# Patient Record
Sex: Female | Born: 1945 | Race: White | Hispanic: No | Smoking: Never smoker
Health system: Southern US, Community
[De-identification: ages and names within clinical notes are randomized; demographics above are authoritative.]

## PROBLEM LIST (undated history)

## (undated) DIAGNOSIS — K219 Gastro-esophageal reflux disease without esophagitis: Secondary | ICD-10-CM

## (undated) DIAGNOSIS — J45909 Unspecified asthma, uncomplicated: Secondary | ICD-10-CM

## (undated) DIAGNOSIS — K7460 Unspecified cirrhosis of liver: Secondary | ICD-10-CM

## (undated) DIAGNOSIS — E119 Type 2 diabetes mellitus without complications: Secondary | ICD-10-CM

## (undated) DIAGNOSIS — Z8719 Personal history of other diseases of the digestive system: Secondary | ICD-10-CM

## (undated) DIAGNOSIS — I10 Essential (primary) hypertension: Secondary | ICD-10-CM

## (undated) DIAGNOSIS — C8590 Non-Hodgkin lymphoma, unspecified, unspecified site: Secondary | ICD-10-CM

## (undated) DIAGNOSIS — M1990 Unspecified osteoarthritis, unspecified site: Secondary | ICD-10-CM

## (undated) DIAGNOSIS — J449 Chronic obstructive pulmonary disease, unspecified: Secondary | ICD-10-CM

## (undated) DIAGNOSIS — I4891 Unspecified atrial fibrillation: Secondary | ICD-10-CM

## (undated) DIAGNOSIS — E039 Hypothyroidism, unspecified: Secondary | ICD-10-CM

## (undated) DIAGNOSIS — D649 Anemia, unspecified: Secondary | ICD-10-CM

## (undated) DIAGNOSIS — F419 Anxiety disorder, unspecified: Secondary | ICD-10-CM

## (undated) HISTORY — PX: CATARACT EXTRACTION: SUR2

## (undated) HISTORY — PX: THYROIDECTOMY: SHX17

## (undated) HISTORY — PX: CHOLECYSTECTOMY: SHX55

## (undated) HISTORY — PX: TOE AMPUTATION: SHX809

## (undated) HISTORY — PX: TOTAL SHOULDER REPLACEMENT: SUR1217

## (undated) HISTORY — PX: WRIST SURGERY: SHX841

## (undated) HISTORY — PX: EYE SURGERY: SHX253

## (undated) HISTORY — PX: GALLBLADDER SURGERY: SHX652

## (undated) HISTORY — PX: TUBAL LIGATION: SHX77

## (undated) HISTORY — PX: HUMERUS SURGERY: SHX672

## (undated) HISTORY — PX: TONSILECTOMY, ADENOIDECTOMY, BILATERAL MYRINGOTOMY AND TUBES: SHX2538

## (undated) HISTORY — PX: COLONOSCOPY: SHX174

## (undated) LAB — HM DEXA SCAN

## (undated) LAB — HM COLONOSCOPY: HM Colonoscopy: NORMAL

## (undated) LAB — HM MAMMOGRAPHY
HM Mammogram: NEGATIVE
HM Mammogram: NORMAL
HM Mammogram: NORMAL

## (undated) MED FILL — NALOXONE 4 MG/ACTUATION NASAL SPRAY: 4 4 mg/actuation | NASAL | 1 days supply | Qty: 2 | Fill #0

---

## 1978-06-06 DIAGNOSIS — I82409 Acute embolism and thrombosis of unspecified deep veins of unspecified lower extremity: Secondary | ICD-10-CM

## 1978-06-06 HISTORY — DX: Acute embolism and thrombosis of unspecified deep veins of unspecified lower extremity: I82.409

## 2003-11-08 NOTE — Unmapped (Signed)
Signed by Alver Fisher MD on 11/08/2003 at 00:00:00  Orthopaedic      Imported By: Danelle Berry 11/16/2005 10:59:35    _____________________________________________________________________    External Attachment:    Please see [employer] EMR for this document.

## 2004-07-29 NOTE — Unmapped (Signed)
Signed by   LinkLogic on 08/01/2004 at 17:26:32  Patient: [name]  Note: All result statuses are Final unless otherwise noted.    Tests: (1) DIAG-L-SPINE AP& LAT & BENDING [phone])    Order NotePricilla Handler Order Number: [id number]    Order Note:     *** VERIFIED ***  [facility]  Reason:  LUMBAR SPINE PAIN  Dict.Staff: Harvel Ricks    Verified By: Harvel Ricks       Ver: 08/01/04   5:26 pm  Exams:  DIAG-L-SPINE AP& LAT & BENDING      LUMBAR SPINE WITH AP BENDING, FOUR VIEWS PERFORMED ON 07/29/2004:    INDICATION: Pain.    READING:    1. LUMBAR SPINE STABLE IN FLEXION AND EXTENSION.    2. TINY SPURS ON SUPERIOR ENDPLATE OF L4.  THERE IS MILD  ARTHROSIS IN THE SACROILIAC JOINTS.    /rmc:jb  **** end of result ****    Note: An exclamation mark (!) indicates a result that was not dispersed into   the flowsheet.  Document Creation Date: 08/01/2004 5:26 PM  _______________________________________________________________________    (1) Order result status: Final  Collection or observation date-time: 07/29/2004 15:34:00  Requested date-time: 07/29/2004 15:34:00  Receipt date-time:   Reported date-time: 08/01/2004 17:26:27  Referring Physician:    Ordering Physician: Val Riles [facility])  Specimen Source:   Source: QRS  Filler Order Number: [id number]  Lab site: [facility]

## 2005-08-01 ENCOUNTER — Inpatient Hospital Stay

## 2005-08-01 NOTE — Unmapped (Signed)
Signed by   LinkLogic on 08/01/2005 at 16:23:02  Patient: [name]  Note: All result statuses are Final unless otherwise noted.    Tests: (1) DIAG-SHOULDER MIN 2-VIEWS RT [phone])    Order NotePricilla Handler Order Number: [id number]    Order Note:     *** VERIFIED ***  [facility]  Reason:  RT SHLD PAIN  Dict.Staff: Harvel Ricks    Verified By: Harvel Ricks       Ver: 08/01/05   4:20 pm  Exams:  Fletcher Anon MIN 2-VIEWS RT      RIGHT SHOULDER, FOUR VIEWS PERFORMED ON 08/01/2005:    INDICATION: Pain.    READING:    NO RADIOGRAPHIC ABNORMALITY.    /rmc:jb  **** end of result ****    Note: An exclamation mark (!) indicates a result that was not dispersed into   the flowsheet.  Document Creation Date: 08/01/2005 4:23 PM  _______________________________________________________________________    (1) Order result status: Final  Collection or observation date-time: 08/01/2005 10:08:00  Requested date-time: 08/01/2005 10:08:00  Receipt date-time:   Reported date-time: 08/01/2005 16:20:08  Referring Physician: Cindi Carbon NON-STAFF  Ordering Physician: Gareth [facility])  Specimen Source:   Source: QRS  Filler Order Number: [id number]  Lab site: [facility]

## 2006-03-07 NOTE — Unmapped (Signed)
Signed by Christie Nottingham MD on 03/07/2006 at 19:18:22      Reason for Visit   Chief Complaint: rash around eye and mouth/hip pain      Allergies  Allergies have not been documented for this patient    Medications     Vital Signs:   Wt: 101 lbs.      Pulse: 80  BP: 90/70    Intake recorded by: Francene Boyers MA on March 07, 2006 2:56 PM      History of Present Illness: Rash around mouth and on eyelids.  Had similar rash many years ago.  Comes and goes, more inflamed at times.  Has been helped by cortisone in past.  Has not put anything on recently.  Has appt Oct 25 Dr. Bertram Millard, more painful yesterday, wished to see if anything she can do before seeing Dr. Bertram Millard.  Also c/o pain right leg, posteriorly under buttocks, radiating down posterior leg to knee.  Had recent [place] to [place], worse since then.  Had lumbar disc herniation inpast, does not feel like this.  Denies numbness, weakness of leg.           Physical Examination:     Additional Vitals:   BP: 90/  70    Physical Exam- Detail:   General Appearance: well-developed, well-nourished and in no acute distress.  Skin: Abnormal -Erythema in .5 to 1 cm area around lips, primarily on left, some scaling, well demarcated border.    Eyes: Sclera white, conjunctiva without injection and pallor.  PERRLA.  EOMI  Ears: No lesions.  Tympanic membranes translucent, non-bulging.  Canal walls pink, without discharge.  Hearing grossly intact.  Oropharynx: Normal appearance.  No erythema,exudate, or mass. No tonsillar swelling.  Oral Cavity: Gums pink, good dentition.  Oral mucosa and tongue without lesions.  Lumbar Spine: No misalignment, defects, or erythema.  No tenderness, crepitus or subluxation. Full ROM.  Left Lower Extremity: No misalignment, defects, or erythema.  No tenderness, crepitus or subluxation. No synovial thickening or nodularity. Full ROM.  Some pain with straight leg raise in sciatic nerve area.        New Problems:  SCIATICA (ICD-724.3)  FACIAL RASH  (ICD-782.1)  New Medications:  NYSTATIN-TRIAMCINOLONE OINT (NYSTATIN-TRIAMCINOLONE OINT) apply to affected area three times a day      Assessment and Plan  Status of Existing Problems:  Assessed SCIATICA as new - Christie Nottingham MD - Signed  Assessed FACIAL RASH as comment only - May be fungal etiology, try nystatin/triamcinolone ointment. Christie Nottingham MD  Assessed SCIATICA as comment only - Will refer for PT. - Christie Nottingham MD  New Problems:  Dx of SCIATICA (ICD-724.3)  Onset: 03/07/2006  Dx of FACIAL RASH (ICD-782.1)  Onset: 03/07/2006    Medications   New Prescriptions/Refills:  NYSTATIN-TRIAMCINOLONE OINT (NYSTATIN-TRIAMCINOLONE OINT) apply to affected area three times a day  #15g x 2 : Christie Nottingham MD (03/07/2006)    New medications:  NYSTATIN-TRIAMCINOLONE OINT -- apply to affected area three times a day  Start date: 03/07/2006      Today's Orders   Physical Therapy Evaluation [CPT-97001]  99213 - Ofc Vst, Est Level III [JOA-41660]            Prescriptions:  NYSTATIN-TRIAMCINOLONE OINT (NYSTATIN-TRIAMCINOLONE OINT) apply to affected area three times a day  #15g x 2   Entered and Authorized by: Christie Nottingham MD   Signed by: Christie Nottingham MD on  03/07/2006   Method used: Print then Give to Patient

## 2006-05-03 NOTE — Unmapped (Signed)
Signed by   LinkLogic on 05/03/2006 at 17:14:12  Patient: [name]  Note: All result statuses are Final unless otherwise noted.    Tests: (1)  (MR)    Order Note:                                      THE [facility]     PATIENT NAME:   GABRIELLA, WOODHEAD                   MR #:  [id number]  DATE OF BIRTH:  1945-07-29                         ACCOUNT #:  [id number]  ED PHYSICIAN:   Chelsea Aus, M.D.             ROOM #:  PRIMARY:        Sindy Messing, M.D.                 NURSING UNIT:  ED  REFERRING:                                         FC:  S  DICTATED BY:    Pearson Forster, M.D.                   ADMIT DATE:  05/03/2006  VISIT DATE:     05/03/2006                         DISCHARGE DATE:                           EMERGENCY DEPARTMENT ADMISSION NOTE        CHIEF COMPLAINT: Fall, left side pain.     HISTORY OF PRESENT ILLNESS:  The patient is a 60 year old female with no  significant past medical history who plays violin with an orchestrate and was  practicing at the [facility] when she lost her footing, falling  approximately three feet off a raised platform onto the floor.  She states  that she was more concerned about her violin when she fell and was not paying  attention to how exactly she fell.  She feels like she fell onto her left  side.  She denies any loss of consciousness but is having pain in the left  side of her chest as well as her left knee.  She states that she has been  unable to ambulate on the knee since falling.  She denies any abdominal pain  or other systemic complaints.     REVIEW OF SYSTEMS:  As per HPI.  All other systems reviewed are negative.     PAST MEDICAL HISTORY:  None.     MEDICATIONS:     1.  Progestin.     ALLERGIES:     1. Iodine, causes hives.     SOCIAL HISTORY:  Denies any tobacco use, has occasional alcohol consumption.  Denies any IV or recreational drug use.     PHYSICAL EXAMINATION:     VITAL SIGNS:  Temperature 97.1, pulse 83, blood pressure 124/80,  respirations  16, oxygen saturation 98% on room air.  GENERAL:  She is a well-appearing female in no acute distress.  HEENT:  Pupils are equal, round and reactive to light.  Oropharynx is moist  without erythema or exudate.  NECK:  Supple without lymphadenopathy.  LUNGS:  Clear to auscultation bilaterally with good air movement.  No pain on  respiration.  ABDOMEN:  Soft, nontender, nondistended without rebound or guarding.  CARDIOVASCULAR:  Regular rate and rhythm with no murmurs, rubs or gallops  appreciated.  EXTREMITIES:  Warm without clubbing, cyanosis or edema.  Her left knee had  mild suprapatellar effusion with tenderness along the lateral and medial  meniscus as well as pain with internal and external rotation.  She had no  pain with axial loading of the knee.  No anterior, posterior drawer laxity.  She does have pain with both varus and valgus stress of the joint.  She had  2+ dorsalis pedis pulses with intact sensation in L4 through S1 in the foot.     EMERGENCY DEPARTMENT COURSE:  The patient was seen and evaluated by myself as  well as Dr. Alla German. She was given Motrin 800 mg po for pain relief and had  an Ace wrap and ice placed on the knee chest x-ray and left knee films were  performed.  Upon reviewing the left knee films with the radiologist, obliques  were ordered to further identify a possible fracture.  She has evidence of a  tibial spine fracture.  Orthopedics was consulted to evaluate the patient for  possible operative management. The remainder of her emergency department  course and final disposition will be dictated in  an addendum to follow.                                                       ________________________________________  KS/akg                                ____  D:  05/03/2006 15:28                  Pearson Forster, M.D.  T:  05/03/2006 17:09  Job #:  [id number]                                        ________________________________________                                         ____                                        Chelsea Aus, M.D.                              EMERGENCY DEPARTMENT ADMISSION NOTE                                        COPY  PAGE    1 of 1    Note: An exclamation mark (!) indicates a result that was not dispersed into   the flowsheet.  Document Creation Date: 05/03/2006 5:14 PM  _______________________________________________________________________    (1) Order result status: Final  Collection or observation date-time: 05/03/2006 00:00  Requested date-time:   Receipt date-time:   Reported date-time:   Referring Physician: Sindy Messing  Ordering Physician:  [facility])  Specimen Source:   Source: DBS  Filler Order Number: [id number]  Lab site:

## 2006-05-03 NOTE — Unmapped (Signed)
Signed by   LinkLogic on 05/03/2006 at 16:02:35  Patient: [name]  Note: All result statuses are Final unless otherwise noted.    Tests: (1) DIAG-KNEE 1 OR 2-VIEWS LT ([id number])    Order NotePricilla Handler Order Number: [id number]    Non-EMR Ordering Provider: Pearson Forster A     Order Note:     *** VERIFIED [facility]  Reason:  FALL PAIN  Dict.Staff: Lang Snow [id number]  Dict.Res: Etheleen Mayhew [id number]  Verified By: Lang Snow       Ver: 05/03/06   4:04 pm  Exams:  DIAG-CHEST PA & LATERAL  DIAG-KNEE 1 OR 2-VIEWS LT    Exam: PA and lateral chest x-ray and 2 views of the left knee  dated 05/03/2006.    Indication:  FALL PAIN    Comparison: None.    Findings chest:  The lungs are clear. The cardiac and mediastinal contours are  normal in appearance. There are no bone or soft tissue  abnormalities seen.    Impression chest:  1. No acute cardiopulmonary disease.    Findings left knee:  There is a suprapatellar effusion. There is a thin oblique  lucency along the lateral articular surface of the tibia. The  lateral articular surface of the tibia is indistinct.    Impression left knee:  1. Suprapatellar effusion.  2. Cortical irregularity of the lateral tibial plateau.  Recommend evaluation with obliques to rule out fracture.  **** end of result ****    Note: An exclamation mark (!) indicates a result that was not dispersed into   the flowsheet.  Document Creation Date: 05/03/2006 4:02 PM  _______________________________________________________________________    (1) Order result status: Final  Collection or observation date-time: 05/03/2006 14:10:29  Requested date-time: 05/03/2006 14:01:00  Receipt date-time:   Reported date-time: 05/03/2006 16:04:13  Referring Physician: Trudee Grip  Ordering Physician:  Non-EMR Physician [facility])  Specimen Source:   Source: QRS  Filler Order Number: [id number]  Lab site: [facility]

## 2006-05-03 NOTE — Unmapped (Signed)
Signed by   LinkLogic on 05/03/2006 at 20:27:29  Patient: [name]  Note: All result statuses are Final unless otherwise noted.    Tests: (1)  (MR)    Order Note:                                      THE [facility]     PATIENT NAME:   [name], [name]                   MR #:  [id number]  DATE OF BIRTH:  [birth year]                         ACCOUNT #:  [id number]  ED PHYSICIAN:   Chelsea Aus, M.D.             ROOM #:  PRIMARY:        Sindy Messing, M.D.                 NURSING UNIT:  ED  REFERRING:      Referring Nonstaff                 FC:  N  DICTATED BY:    Pearson Forster, M.D.                   ADMIT DATE:  05/03/2006  VISIT DATE:     05/03/2006                         DISCHARGE DATE:                       EMERGENCY DEPARTMENT 24 HOUR FOLLOWUP NOTE     ADDENDUM - May 03, 2006 - ljf     The patient was awaiting repeat an orthopedic evaluation. Orthopedics  reviewed the patient's film and had a CT of the tibia performed for her  tibial spine and tibial plateau fracture.  Pending their evaluation, _____  they determined that at this point she be placed in knee immobilizer and  follow-up on an outpatient basis with the [facility].  She  was placed in a knee immobilizer, given crutches, education, told to follow  up with orthopedics and given the phone number to do so. She was made  nonweightbearing and will be following up later this week.     IMPRESSION:     1. Left tibial plateau fracture.     DISPOSITION:  The patient discharged home in stable condition.     PLAN:     1. She was given scripts for Percocet and Flexeril to be used as directed.  2. She is told to return for worsening pain, swelling or other worrisome     symptoms.                                                       ________________________________________  KS/lf                                 ____  D:  05/03/2006 19:55  Pearson Forster, M.D.  T:  05/03/2006 20:18  Job #:  [id number]                  ________________________________________                                        ____                                        Chelsea Aus, M.D.                          EMERGENCY DEPARTMENT 24 HOUR FOLLOWUP NOTE                                        COPY                   PAGE    1 of 1    Note: An exclamation mark (!) indicates a result that was not dispersed into   the flowsheet.  Document Creation Date: 05/03/2006 8:27 PM  _______________________________________________________________________    (1) Order result status: Final  Collection or observation date-time: 05/03/2006 00:00  Requested date-time:   Receipt date-time:   Reported date-time:   Referring Physician: Referring Nonstaff  Ordering Physician:  Reviewed In [facility])  Specimen Source:   Source: DBS  Filler Order Number: [id number]  Lab site:

## 2006-05-03 NOTE — Unmapped (Signed)
THE [facility]     PATIENT NAME:   [name], [name]                   MR #:  [id number]   DATE OF BIRTH:  1945/08/03                         ACCOUNT #:  [id number]   ED PHYSICIAN:   Chelsea Aus, M.D.             ROOM #:   PRIMARY:        Sindy Messing, M.D.                 NURSING UNIT:  ED   REFERRING:      Referring Nonstaff                 FC:  N   DICTATED BY:    Pearson Forster, M.D.                   ADMIT DATE:  05/03/2006   VISIT DATE:     05/03/2006                         DISCHARGE DATE:                       EMERGENCY DEPARTMENT 24 HOUR FOLLOWUP NOTE     ADDENDUM - May 03, 2006 - ljf     The patient was awaiting repeat an orthopedic evaluation. Orthopedics   reviewed the patient's film and had a CT of the tibia performed for her   tibial spine and tibial plateau fracture.  Pending their evaluation, _____   they determined that at this point she be placed in knee immobilizer and   follow-up on an outpatient basis with the [facility].  She   was placed in a knee immobilizer, given crutches, education, told to follow   up with orthopedics and given the phone number to do so. She was made   nonweightbearing and will be following up later this week.     IMPRESSION:     1. Left tibial plateau fracture.     DISPOSITION:  The patient discharged home in stable condition.     PLAN:     1. She was given scripts for Percocet and Flexeril to be used as directed.   2. She is told to return for worsening pain, swelling or other worrisome      symptoms.                                                     ________________________________________   KS/lf                                 ____   D:  05/03/2006 19:55                  Pearson Forster, M.D.   T:  05/03/2006 20:18   Job #:  [id number]  ________________________________________                                         ____                                         Chelsea Aus, M.D.                         EMERGENCY DEPARTMENT 24 HOUR FOLLOWUP NOTE                                         COPY                   PAGE    1 of 1                                         ____                                         Chelsea Aus, M.D.                         EMERGENCY DEPARTMENT 24 HOUR FOLLOWUP NOTE                                         COPY                   PAGE    1 of 1

## 2006-05-03 NOTE — Unmapped (Signed)
Signed by   LinkLogic on 05/03/2006 at 16:02:55  Patient: [name]  Note: All result statuses are Final unless otherwise noted.    Tests: (1) DIAG-KNEE 1 OR 2-VIEWS LT ([id number])    Order NotePricilla Handler Order Number: [id number]    Non-EMR Ordering Provider: Pearson Forster A     Order Note:     *** VERIFIED [facility]  Reason:  POSS. TIBIAL PLATEU FX  Dict.Staff: Lang Snow [id number]  Dict.Res: Etheleen Mayhew [id number]  Verified By: Lang Snow       Ver: 05/03/06   4:04 pm  Exams:  DIAG-KNEE 1 OR 2-VIEWS LT      Exam: Two views of the left knee dated 05/03/2006.    Indication: Evaluate for tibial plateau fracture.    Comparison: Two views of the left knee dated 05/03/2006.    Findings:  There is an impacted fracture of the lateral tibial plateau and  tibial spine. No other fractures are identified.    Impression:  1. intra-articular fracture of the lateral tibial plateau  **** end of result ****    Note: An exclamation mark (!) indicates a result that was not dispersed into   the flowsheet.  Document Creation Date: 05/03/2006 4:02 PM  _______________________________________________________________________    (1) Order result status: Final  Collection or observation date-time: 05/03/2006 15:06:09  Requested date-time: 05/03/2006 15:03:00  Receipt date-time:   Reported date-time: 05/03/2006 16:04:33  Referring Physician: Trudee Grip  Ordering Physician:  Non-EMR Physician [facility])  Specimen Source:   Source: QRS  Filler Order Number: [id number]  Lab site: [facility]

## 2006-05-03 NOTE — Unmapped (Signed)
Signed by   LinkLogic on 05/03/2006 at 16:02:35  Patient: [name]  Note: All result statuses are Final unless otherwise noted.    Tests: (1) DIAG-CHEST PA & LATERAL [id number])    Order NotePricilla Handler Order Number: [id number]    Non-EMR Ordering Provider: Pearson Forster A     Order Note:     *** VERIFIED [facility]  Reason:  FALL PAIN  Dict.Staff: Lang Snow [id number]  Dict.Res: Etheleen Mayhew [id number]  Verified By: Lang Snow       Ver: 05/03/06   4:04 pm  Exams:  DIAG-CHEST PA & LATERAL  DIAG-KNEE 1 OR 2-VIEWS LT    Exam: PA and lateral chest x-ray and 2 views of the left knee  dated 05/03/2006.    Indication:  FALL PAIN    Comparison: None.    Findings chest:  The lungs are clear. The cardiac and mediastinal contours are  normal in appearance. There are no bone or soft tissue  abnormalities seen.    Impression chest:  1. No acute cardiopulmonary disease.    Findings left knee:  There is a suprapatellar effusion. There is a thin oblique  lucency along the lateral articular surface of the tibia. The  lateral articular surface of the tibia is indistinct.    Impression left knee:  1. Suprapatellar effusion.  2. Cortical irregularity of the lateral tibial plateau.  Recommend evaluation with obliques to rule out fracture.  **** end of result ****    Note: An exclamation mark (!) indicates a result that was not dispersed into   the flowsheet.  Document Creation Date: 05/03/2006 4:02 PM  _______________________________________________________________________    (1) Order result status: Final  Collection or observation date-time: 05/03/2006 14:10:33  Requested date-time: 05/03/2006 14:01:00  Receipt date-time:   Reported date-time: 05/03/2006 16:04:13  Referring Physician: Trudee Grip  Ordering Physician:  Non-EMR Physician [facility])  Specimen Source:   Source: QRS  Filler Order Number: [id number]  Lab site: [facility]

## 2006-05-03 NOTE — Unmapped (Signed)
THE [facility]     PATIENT NAME:   SARON, VANORMAN                   MR #:  [id number]   DATE OF BIRTH:  1946-05-19                         ACCOUNT #:  [id number]   ED PHYSICIAN:   Chelsea Aus, M.D.             ROOM #:   PRIMARY:        Sindy Messing, M.D.                 NURSING UNIT:  ED   REFERRING:                                         FC:  S   DICTATED BY:    Pearson Forster, M.D.                   ADMIT DATE:  05/03/2006   VISIT DATE:     05/03/2006                         DISCHARGE DATE:                           EMERGENCY DEPARTMENT ADMISSION NOTE       CHIEF COMPLAINT: Fall, left side pain.     HISTORY OF PRESENT ILLNESS:  The patient is a 60 year old female with no   significant past medical history who plays violin with an orchestrate and was   practicing at the [facility] when she lost her footing, falling   approximately three feet off a raised platform onto the floor.  She states   that she was more concerned about her violin when she fell and was not paying   attention to how exactly she fell.  She feels like she fell onto her left   side.  She denies any loss of consciousness but is having pain in the left   side of her chest as well as her left knee.  She states that she has been   unable to ambulate on the knee since falling.  She denies any abdominal pain   or other systemic complaints.     REVIEW OF SYSTEMS:  As per HPI.  All other systems reviewed are negative.     PAST MEDICAL HISTORY:  None.     MEDICATIONS:     1.  Progestin.     ALLERGIES:     1. Iodine, causes hives.     SOCIAL HISTORY:  Denies any tobacco use, has occasional alcohol consumption.   Denies any IV or recreational drug use.     PHYSICAL EXAMINATION:     VITAL SIGNS:  Temperature 97.1, pulse 83, blood pressure 124/80, respirations   16, oxygen saturation 98% on room air.   GENERAL:  She is a well-appearing female in no acute distress.   HEENT:  Pupils are equal, round and reactive  to light.  Oropharynx is moist   without erythema or exudate.   NECK:  Supple without lymphadenopathy.   LUNGS:  Clear to auscultation bilaterally with good air movement.  No pain on  respiration.   ABDOMEN:  Soft, nontender, nondistended without rebound or guarding.   CARDIOVASCULAR:  Regular rate and rhythm with no murmurs, rubs or gallops   appreciated.   EXTREMITIES:  Warm without clubbing, cyanosis or edema.  Her left knee had   mild suprapatellar effusion with tenderness along the lateral and medial   meniscus as well as pain with internal and external rotation.  She had no   pain with axial loading of the knee.  No anterior, posterior drawer laxity.   She does have pain with both varus and valgus stress of the joint.  She had   2+ dorsalis pedis pulses with intact sensation in L4 through S1 in the foot.     EMERGENCY DEPARTMENT COURSE:  The patient was seen and evaluated by myself as   well as Dr. Alla German. She was given Motrin 800 mg po for pain relief and had   an Ace wrap and ice placed on the knee chest x-ray and left knee films were   performed.  Upon reviewing the left knee films with the radiologist, obliques   were ordered to further identify a possible fracture.  She has evidence of a   tibial spine fracture.  Orthopedics was consulted to evaluate the patient for   possible operative management. The remainder of her emergency department   course and final disposition will be dictated in  an addendum to follow.                                                     ________________________________________   KS/akg                                ____   D:  05/03/2006 15:28                  Pearson Forster, M.D.   T:  05/03/2006 17:09   Job #:  [id number]                                           ________________________________________                                         ____                                         Chelsea Aus, M.D.                             EMERGENCY DEPARTMENT ADMISSION NOTE                                          COPY                   PAGE    1 of 1

## 2006-05-03 NOTE — Unmapped (Signed)
Signed by   LinkLogic on 05/03/2006 at 21:02:30  Patient: [name]  Note: All result statuses are Final unless otherwise noted.    Tests: (1) CT-LOWER EXTREM W/O CONT LT ([id number])    Order NotePricilla Handler Order Number: [id number]    Non-EMR Ordering Provider: Pearson Forster A     Order Note:     *** VERIFIED [facility]  Reason:  LEFT KNEE FX  Dict.Staff: Nash Mantis [phone]  Dict.Res: Alfonso Patten [phone]  Verified By: Nash Mantis       Ver: 05/03/06   9:04 pm  Exams:  Gilford Silvius EXTREM W/O CONT LT      CT left knee 05/03/2006    Indication: Lateral condyle fracture    Technical factors: Axial imaging was performed through the knee  with images reconstructed at 2.5 and 1.25 mm slices. Coronal  sagittal reformations were performed. No contrast was  administered.    Findings:    There is a comminuted depressed fracture of the proximal tibia  with depression of the lateral tibial plateau. The greatest  degree of depression occurs posteriorly where the fragment is  depressed 1 cm. Fracture lines extend medially into the tibial  spines and into the medial tibial condyle which is not  depressed. There is also a vertical fracture component that  extends inferiorly through the lateral portion of the proximal  tibia. There is a large lipohemarthrosis.    Impression:    Comminuted proximal tibia fracture with approximately 1 cm  depression of the lateral tibial plateau.  **** end of result ****    Note: An exclamation mark (!) indicates a result that was not dispersed into   the flowsheet.  Document Creation Date: 05/03/2006 9:02 PM  _______________________________________________________________________    (1) Order result status: Final  Collection or observation date-time: 05/03/2006 17:05:56  Requested date-time: 05/03/2006 16:25:00  Receipt date-time:   Reported date-time: 05/03/2006 21:04:03  Referring Physician: Trudee Grip  Ordering Physician:  Non-EMR Physician [facility])  Specimen Source:    Source: QRS  Filler Order Number: [id number]  Lab site: [facility]

## 2006-05-09 NOTE — Unmapped (Signed)
Signed by   LinkLogic on 05/10/2006 at 01:25:44  Patient: [name]  Note: All result statuses are Final unless otherwise noted.    Tests: (1)  (MR)    Order Note:                                      THE [facility]     PATIENT NAME:   [name], [name]                   MR #:  [id number]  DATE OF BIRTH:  1946-04-21                         ACCOUNT #:  [id number]  PHYSICIAN:      Skipper Cliche, M.D.             ROOM #:  [phone]  SERVICE:        Orthopedic Surgery                 NURSING UNIT:  [phone]  PRIMARY:        Sindy Messing, M.D.                 FC:  N  REFERRING:      Skipper Cliche, M.D.             ADMIT DATE:  05/09/2006  DICTATED BY:    Skipper Cliche, M.D.             PROCEDURE DATE:  05/09/2006                                                     DISCHARGE DATE:                                     PROCEDURE/OTHER     PREOPERATIVE DIAGNOSIS:     1. Left tibial plateau fracture, unicondylar.     POSTOPERATIVE DIAGNOSIS:     1. Left tibial plateau fracture, unicondylar.        PROCEDURE PERFORMED:     1. Open reduction and internal fixation of left tibial plateau with internal     fixation.     SURGEON:  Skipper Cliche, M.D.     ASSISTANT:  Dr. Laureen Ochs     ANESTHESIA:  General anesthesia.     CLINICAL HISTORY:  The patient is a 60 year old female who sustained a fall  downstep and sustained a left tibial plateau fracture.  She was seen in the  emergency room and was evaluated in the clinic and scheduled for surgery.  The patient also has significant ecchymosis involving her left medial ankle.     DETAILS OF PROCEDURE:  The patient taken to the operating room, placed under  general anesthesia, preoperative antibiotics given.  A timeout was performed  to identify the correct extremity.  Left lower extremity was sterilely  prepped and draped in the usual fashion.  The leg was exsanguinated and  tourniquet inflated.  A lateral approach to the tibial plateau was carried  out.  The IT band was  split.  It was  elevated off the anterior and posterior  aspects of the tibial plateau.  The anterior compartment was also elevated  submuscularly.  A submeniscal dissection was carried out.  The meniscus was  identified and was deemed to be intact.  The patient has a large depressed  tibial plateau fracture involving the posterolateral corner.  There was no  split in the frontal plane.  However, there is a split in the sagittal plane.  We were unable to lift his open, thus osteotomy window was created inferiorly  and using carpi and bone tendon we were able to elevate the depressed  articular segment.  Once that was elevated, a bone graft was packed behind.  The reduction was attempted to be stabilized with K-wire and further  reinforce the screw in front of anteriorly and posteriorly.  Another screw  was placed down the shaft to capture the long extended split of the tibia.  Once the depressed articular surface was reduced and stabilized the 12-hole  plate was used.  We were able to stabilize the plate to the shaft and reduced  it first to the standard screws; one with a kickstand screw, then another one  in the shaft.  At the joint line level, two regular screws were placed also  to reduce the fracture the plate to the tibial plateau.  Additional locking  screw was placed as a subchondral graft and two along the shaft with the most  distal screw and the one in the middle.  We were able to have a rigid  construct to span the fracture.  The joint was well reduced.  The subacromial  repair was able to be done through to the hole in the tibial plateau plate.  This was repaired with 0 PDS.  The IT band was able to be closed over the  plate and later closure was done.  A sterile dressing was then placed.  A  C-arm image was checked with the foot ankle was also done and it was deemed  to be negative for any fracture.  Once done with that, sterile dressing was  placed and the patient was awakened and taken to the recovery  room.  Intraoperative film was also taken and we were able to satisfactorily reduce  the joint surface as well as stabilize the fracture both the unicondylar and  also down in the shaft.                                                                ________________________________________  TTL/em                                ____  D:  05/09/2006 16:02                  Skipper Cliche, M.D.  T:  05/10/2006 01:10  Job #:  [id number]  c:   Laureen Ochs                                     PROCEDURE/OTHER  COPY                   PAGE    1 of 1    Note: An exclamation mark (!) indicates a result that was not dispersed into   the flowsheet.  Document Creation Date: 05/10/2006 1:25 AM  _______________________________________________________________________    (1) Order result status: Final  Collection or observation date-time: 05/09/2006 00:00  Requested date-time:   Receipt date-time:   Reported date-time:   Referring Physician: Dorothy Spark  Ordering Physician:  Reviewed In [facility])  Specimen Source:   Source: DBS  Dewain Penning Order Number: [id number]  Lab site:

## 2006-05-09 NOTE — Unmapped (Signed)
Signed by Skipper Cliche MD on 05/09/2006 at 00:00:00  Hospital Procedure Report      Imported By: Coletta Memos 12/28/2009 11:21:32    _____________________________________________________________________    External Attachment:    Please see [facility] EMR for this document.

## 2006-05-09 NOTE — Unmapped (Signed)
Signed by   LinkLogic on 05/09/2006 at 15:47:24  Patient: [name]  Note: All result statuses are Final unless otherwise noted.    Tests: (1) DIAG-KNEE 1 OR 2-VIEWS LT [phone])    Order NotePricilla Handler Order Number: [id number]    Order Note:     *** VERIFIED [facility]  Reason:  LT TIB-PLAT FX, ORIF  Dict.Staff: Rickard Patience [id number]    Verified By: Rickard Patience      Ver: 05/09/06   3:49 pm  Exams:  DIAG-KNEE 1 OR 2-VIEWS LT      Left interoperative portable knee 05/09/2006 1520 hours.    Comparison is made to left knee x-ray of 05/03/2006.    Indication: Left tibial plateau fracture. Open reduction  internal fixation.    Findings:    A malleable plate has been applied to the proximal tibia  containing 6 screws and 2 interfragmentary screws. There is a  comminuted intra-articular tibial plateau fracture. The joint  space is preserved. There is air within the joint.    Impression:    Open reduction internal fixation left lateral tibial plateau  fracture with minimal articular depression.  **** end of result ****    Note: An exclamation mark (!) indicates a result that was not dispersed into   the flowsheet.  Document Creation Date: 05/09/2006 3:47 PM  _______________________________________________________________________    (1) Order result status: Final  Collection or observation date-time: 05/09/2006 15:24:23  Requested date-time: 05/09/2006 12:23:00  Receipt date-time:   Reported date-time: 05/09/2006 15:49:09  Referring Physician: Dorothy Spark  Ordering Physician: Dorothy Spark (LETT)  Specimen Source:   Source: QRS  Filler Order Number: [id number]  Lab site: [facility]

## 2006-05-09 NOTE — Unmapped (Signed)
Signed by   LinkLogic on 05/09/2006 at 15:52:43  Patient: [name]  Note: All result statuses are Final unless otherwise noted.    Tests: (1) DIAG-FLUORO UP TO 1 HOUR ([id number])    Order NotePricilla Handler Order Number: [id number]    Order Note:     *** VERIFIED [facility]  Reason:  LT TIB-PLAT FX, ORIF  Dict.Staff: Rickard Patience [id number]    Verified By: Rickard Patience      Ver: 05/09/06   3:54 pm  Exams:  DIAG-FLUORO UP TO 1 HOUR      Diagnostic interoperative fluoroscopy 05/09/2006 1520 hours.    Comparison is made to diagnostic interoperative left knee x-ray  performed same day same time.    Impression:    Up to 1 hour of fluoroscopy was provided intraoperatively during  open reduction internal fixation of a left lateral tibial  plateau fracture without a radiologist in attendance.  **** end of result ****    Note: An exclamation mark (!) indicates a result that was not dispersed into   the flowsheet.  Document Creation Date: 05/09/2006 3:52 PM  _______________________________________________________________________    (1) Order result status: Final  Collection or observation date-time: 05/09/2006 15:24:18  Requested date-time: 05/09/2006 12:23:00  Receipt date-time:   Reported date-time: 05/09/2006 15:54:06  Referring Physician: Dorothy Spark  Ordering Physician: Dorothy Spark (LETT)  Specimen Source:   Source: QRS  Filler Order Number: [id number]  Lab site: [facility]

## 2006-05-10 NOTE — Unmapped (Signed)
THE [facility]     PATIENT NAME:   [name], [name]                   MR #:  [id number]   DATE OF BIRTH:  September 27, 1945                         ACCOUNT #:  [id number]   PHYSICIAN:      Skipper Cliche, M.D.             ROOM #:  [phone]   SERVICE:        Orthopedic Surgery                 NURSING UNIT:  [phone]   PRIMARY:        Sindy Messing, M.D.                 FC:  N   REFERRING:      Skipper Cliche, M.D.             ADMIT DATE:  05/09/2006   DICTATED BY:    Skipper Cliche, M.D.             PROCEDURE DATE:    05/09/2006                                                      DISCHARGE DATE:                                     PROCEDURE/OTHER     PREOPERATIVE DIAGNOSIS:     1. Left tibial plateau fracture, unicondylar.     POSTOPERATIVE DIAGNOSIS:     1. Left tibial plateau fracture, unicondylar.       PROCEDURE PERFORMED:     1. Open reduction and internal fixation of left tibial plateau with internal      fixation.     SURGEON:  Skipper Cliche, M.D.     ASSISTANT:  Dr. Laureen Ochs     ANESTHESIA:  General anesthesia.     CLINICAL HISTORY:  The patient is a 60 year old female who sustained a fall   downstep and sustained a left tibial plateau fracture.  She was seen in the   emergency room and was evaluated in the clinic and scheduled for surgery.   The patient also has significant ecchymosis involving her left medial ankle.     DETAILS OF PROCEDURE:  The patient taken to the operating room, placed under   general anesthesia, preoperative antibiotics given.  A timeout was performed   to identify the correct extremity.  Left lower extremity was sterilely   prepped and draped in the usual fashion.  The leg was exsanguinated and   tourniquet inflated.  A lateral approach to the tibial plateau was carried   out.  The IT band was split.  It was elevated off the anterior and posterior   aspects of the tibial plateau.  The anterior compartment was also elevated   submuscularly.  A  submeniscal dissection was carried out.  The meniscus was   identified and was deemed to be intact.  The patient has a large depressed   tibial plateau fracture  involving the posterolateral corner.  There was no   split in the frontal plane.  However, there is a split in the sagittal plane.   We were unable to lift his open, thus osteotomy window was created inferiorly   and using carpi and bone tendon we were able to elevate the depressed   articular segment.  Once that was elevated, a bone graft was packed behind.   The reduction was attempted to be stabilized with K-wire and further   reinforce the screw in front of anteriorly and posteriorly.  Another screw   was placed down the shaft to capture the long extended split of the tibia.   Once the depressed articular surface was reduced and stabilized the 12-hole   plate was used.  We were able to stabilize the plate to the shaft and reduced   it first to the standard screws; one with a kickstand screw, then another one   in the shaft.  At the joint line level, two regular screws were placed also   to reduce the fracture the plate to the tibial plateau.  Additional locking   screw was placed as a subchondral graft and two along the shaft with the most   distal screw and the one in the middle.  We were able to have a rigid   construct to span the fracture.  The joint was well reduced.  The subacromial   repair was able to be done through to the hole in the tibial plateau plate.   This was repaired with 0 PDS.  The IT band was able to be closed over the   plate and later closure was done.  A sterile dressing was then placed.  A   C-arm image was checked with the foot ankle was also done and it was deemed   to be negative for any fracture.  Once done with that, sterile dressing was   placed and the patient was awakened and taken to the recovery room.   Intraoperative film was also taken and we were able to satisfactorily reduce   the joint surface as well as stabilize  the fracture both the unicondylar and   also down in the shaft.                                                           ________________________________________   TTL/em                                ____   D:  05/09/2006 16:02                  Skipper Cliche, M.D.   T:  05/10/2006 01:10   Job #:  [id number]   c:   Laureen Ochs                                     PROCEDURE/OTHER                                         COPY  PAGE    1 of 1   TTL/em                                ____   D:  05/09/2006 16:02                  Skipper Cliche, M.D.   T:  05/10/2006 01:10   Job #:  [id number]   c:   Laureen Ochs                                     PROCEDURE/OTHER                                         COPY                   PAGE    1 of 1

## 2006-06-19 ENCOUNTER — Inpatient Hospital Stay

## 2006-06-19 NOTE — Unmapped (Signed)
Signed by   LinkLogic on 06/20/2006 at 16:25:54  Patient: [name]  Note: All result statuses are Final unless otherwise noted.    Tests: (1) DIAG-KNEE 1 OR 2-VIEWS RT ([id number])    Order Note: Pricilla Handler Order Number: [id number]    Order Note:     *** VERIFIED ***  [facility]  Reason:  S/P LT TIBIAL PLATEAU FX-LONG CASSETTE NON WEIGHT  BEARING  Dict.Staff: Vertell Novak [phone]    Verified By: Kasandra Knudsen D      Ver: 06/20/06   4:27 pm  Exams:  DIAG-KNEE 1 OR 2-VIEWS RT      LEFT KNEE, TWO VIEWS PERFORMED ON 06/19/2006:    HISTORY: Left tibial plateau fracture.    COMPARISON: 05/09/2006    IMPRESSION:    Images were obtained nonweight bearing as requested.    There has been little change from 12/4 in the appearance of the  tibial plateau fracture which was treated with a long malleable  plate.  No new abnormalities are noted.  There is no evidence of  loosing or other complication.  **** end of result ****    Note: An exclamation mark (!) indicates a result that was not dispersed into   the flowsheet.  Document Creation Date: 06/20/2006 4:25 PM  _______________________________________________________________________    (1) Order result status: Final  Collection or observation date-time: 06/19/2006 07:00:00  Requested date-time: 06/19/2006 07:00:00  Receipt date-time:   Reported date-time: 06/20/2006 16:27:55  Referring Physician: Cindi Carbon NON-STAFF  Ordering Physician: Dorothy Spark (LETT)  Specimen Source:   Source: QRS  Filler Order Number: [id number]  Lab site: [facility]

## 2006-07-24 ENCOUNTER — Inpatient Hospital Stay

## 2006-07-24 NOTE — Unmapped (Signed)
Signed by   LinkLogic on 07/25/2006 at 06:59:41  Patient: [name]  Note: All result statuses are Final unless otherwise noted.    Tests: (1) DIAG-KNEE 1 OR 2-VIEWS LT ([id number])    Order Note: Pricilla Handler Order Number: [id number]    Order Note:     *** VERIFIED ***  [facility]  Reason:  S/P LT TIBIAL PLATEAU FX  Dict.Staff: Donne Hazel [phone]    Verified By: Donne Hazel         Ver: 07/25/06   6:59 am  Exams:  DIAG-KNEE 1 OR 2-VIEWS LT      LEFT KNEE PERFORMED 07/24/2006:    COMPARISON: Study dated 06/19/2006    FINDINGS:    There has been no change in the status of the knee over this  interval. The lateral tibial plateau fracture with plate and  screw fixation is unchanged over this interval.    IMPRESSION:    POST-SURGICAL CORRECTION OF A LATERAL TIBIAL PLATEAU FRACTURE.  NO CHANGE.    .jfw:ajh  **** end of result ****    Note: An exclamation mark (!) indicates a result that was not dispersed into   the flowsheet.  Document Creation Date: 07/25/2006 6:59 AM  _______________________________________________________________________    (1) Order result status: Final  Collection or observation date-time: 07/24/2006 07:00:00  Requested date-time: 07/24/2006 07:00:00  Receipt date-time:   Reported date-time: 07/25/2006 06:59:23  Referring Physician: Cindi Carbon NON-STAFF  Ordering Physician: Dorothy Spark (LETT)  Specimen Source:   Source: QRS  Filler Order Number: [id number]  Lab site: [facility]

## 2006-09-25 ENCOUNTER — Inpatient Hospital Stay

## 2006-09-25 NOTE — Unmapped (Signed)
Signed by   LinkLogic on 09/25/2006 at 15:27:21  Patient: [name]  Note: All result statuses are Final unless otherwise noted.    Tests: (1) DIAG-KNEE 1 OR 2-VIEWS LT ([id number])    Order Note: Pricilla Handler Order Number: [id number]    Order Note:     *** VERIFIED ***  [facility]  Reason:  S/P LT TIBIAL PLATEAU FX  Dict.Staff: Donne Hazel [phone]    Verified By: Donne Hazel         Ver: 09/25/06   3:26 pm  Exams:  DIAG-KNEE 1 OR 2-VIEWS LT      LEFT KNEE PERFORMED ON 09/25/2006:    COMPARISON: 07/24/2006    INDICATION:  Fracture.    FINDINGS:    There has been no change.  The previously noted fracture of the  lateral tibial plateau with plate and screw fixation is  unchanged over this interval.    IMPRESSION:    POSTSURGICAL CORRECTION OF THE LATERAL TIBIAL PLATEAU UNCHANGED  SINCE 07/24/2006.  **** end of result ****    Note: An exclamation mark (!) indicates a result that was not dispersed into   the flowsheet.  Document Creation Date: 09/25/2006 3:27 PM  _______________________________________________________________________    (1) Order result status: Final  Collection or observation date-time: 09/25/2006 07:00:00  Requested date-time: 09/25/2006 07:00:00  Receipt date-time:   Reported date-time: 09/25/2006 15:26:50  Referring Physician: Cindi Carbon NON-STAFF  Ordering Physician: Dorothy Spark (LETT)  Specimen Source:   Source: QRS  Filler Order Number: [id number]  Lab site: [facility]

## 2006-10-10 ENCOUNTER — Inpatient Hospital Stay

## 2006-10-10 NOTE — Unmapped (Signed)
Signed by   LinkLogic on 10/10/2006 at 17:28:56  Patient: [name]  Note: All result statuses are Final unless otherwise noted.    Tests: (1) MRI-UPPER EXT JOINT W/O RT ([id number])    Order NotePricilla Handler Order Number: [id number]    Order Note:     *** VERIFIED ***  [facility]  Reason:  RT SHOULDER DX R/O RC TEAR  Dict.Staff: Lum Babe [phone]  Dict.Res: LAMPKIN (FELLOW), JONATHAN [phone]  Verified By: Lum Babe    Ver: 10/10/06   5:28 pm  Exams:  MRI-UPPER EXT JOINT W/O RT      MRI: The right shoulder dated Oct 10, 2006.    Indication:    Right shoulder pain, evaluate for rotator cuff tear.    Technique:    Standard multiplanar multisequence imaging of the right shoulder  was obtained without contrast.    Findings:    There is an approximately 1.2 cm by 1.6 cm area of increased  intermediate signal within the distal portion the supraspinatus  tendon extending to the attachment site. There is fluid bright  T2 signal seen along the undersurface of the tendon with a tiny  linear focus of fluid appearing to extend to the bursal surface.  The remainder of the rotator cuff appears intact. There is no  fatty atrophy. The biceps tendon is normal in appearance. The  labrum is intact with some mild degenerative fraying seen  anteriorly. Fibrocystic changes are noted within the posterior  aspect of the humeral head.    Impression:    1. Moderate sized high-grade partial thickness tear and  associated tendinopathy of the supraspinatus tendon with a  possible small full thickness component. There is no associated  atrophy.  **** end of result ****    Note: An exclamation mark (!) indicates a result that was not dispersed into   the flowsheet.  Document Creation Date: 10/10/2006 5:28 PM  _______________________________________________________________________    (1) Order result status: Final  Collection or observation date-time: 10/10/2006 08:57:07  Requested date-time: 10/10/2006 08:30:00  Receipt  date-time:   Reported date-time: 10/10/2006 17:28:47  Referring Physician: Cindi Carbon NON-STAFF  Ordering Physician: [facility])  Specimen Source: R  Source: QRS  Filler Order Number: [id number]  Lab site: [facility]

## 2006-10-11 NOTE — Unmapped (Signed)
Signed by Esmond Plants RMA on 10/11/2006 at 18:45:25      Preload Clinical Lists   Problems added:   VERTIGO (ICD-780.4)  SCIATICA (ICD-724.3)  FACIAL RASH (ICD-782.1)    Medications added:   NYSTATIN-TRIAMCINOLONE OINT (NYSTATIN-TRIAMCINOLONE OINT) apply to affected area three times a day  PREMARIN  TABS (ESTROGENS CONJUGATED TABS) as dir per dr palmer  ALLEGRA 60 MG TABS (FEXOFENADINE HCL) 1 by mouth every 12hrs as needed  NASACORT AQ 55 MCG/ACT AERS (TRIAMCINOLONE ACETONIDE(NASAL)) 2 sprays each nostril every day    Allegies added:   ! IODINE

## 2006-10-12 NOTE — Unmapped (Signed)
Signed by Alver Fisher MD on 10/12/2006 at 15:40:05      Reason for Visit   Chief Complaint: rash      Allergies  ! IODINE    Medications     Vital Signs:   Wt: 98 lbs.      Wt chg (lbs): -3  Pulse: 84  BP: 104/70    Intake recorded by: Sharma Covert RMA on Oct 12, 2006 3:11 PM      History of Present Illness: Here for rash on lower left leg for weeks or more. Had a fracture of tibial plateau repaired with a plate and screws. Doing fine with that but has had rash along incision line for weeks. Tried OTC cortisone and neosporin without any benefit. Minimal pain or itching. The question of whether this represents an allergy to the metal has been raised. No rash elsewhere. No new meds.          Physical Examination:   BP: 104/  70    Physical Exam- Detail:   General Appearance: Thin. Appears well.   Skin: Dry, red, scaly rash over anterior leg - like psoriasis. Mild tenderness. Not warm.         Follow-up for Test Results:     New Problems:  SKIN RASH (ICD-782.1)      Preventive Maintenance        Assessment and Plan  Status of Existing Problems:  Assessed SKIN RASH as new - I am not sure what this is. Does not appear to be infection. Has seen Dr. Bertram Millard in past - I will call Dr. Bertram Millard and either get him in to him or develop a plan.  Alver Fisher MD  New Problems:  Dx of SKIN RASH [phone])     Today's Orders   99213 - Ofc Vst, Est Level III [place].Jean                        ]

## 2006-10-25 ENCOUNTER — Inpatient Hospital Stay

## 2006-10-31 NOTE — Unmapped (Signed)
Signed by Simonne Come. Starlette Thurow MD on 10/31/2006 at 00:00:00  Dermatology      Imported By: Heath Gold 11/08/2006 16:21:22    _____________________________________________________________________    External Attachment:    Please see [employer] EMR for this document.

## 2006-10-31 NOTE — Unmapped (Addendum)
Signed by Linus Galas MA on 11/10/2006 at 11:31:02    _____________________________________________________________________    External Attachment:    Please see [employer] EMR for this document.    Dermatology Past History   Personal History of Skin Cancer/Melanoma:   No  Sunburns Easily:   Yes  Uses Sunscreen:   Yes  History By: patient  Chief Complaint: rash      HPI - Rash  see scanned provider note  Current Meds:       Past History  Family History (reviewed - no changes required): denies fm hx of mm    Dermatology Past History   Personal History of Skin Cancer/Melanoma:   No  Sunburns Easily:   Yes  Uses Sunscreen:   Yes      Intake-Dermatology       Allergies  ! IODINE  Allergy and adverse reaction list reviewed during this update.    Intake recorded by: Linus Galas MA  Oct 31, 2006 3:17 PM      Review of Systems   General: unremarkable    Allergic/Immunologic: drug allergies      Prescriptions:  TRIAMCINOLONE ACETONIDE 0.1 % OINT (TRIAMCINOLONE ACETONIDE) apply as dir  #60   x 5   Entered by: Linus Galas MA   Authorized by: Simonne Come. Pharrah Rottman MD   Signed by: Linus Galas MA on 10/31/2006   Method used: Print then Give to Patient   RxID: [id number]      Rash  Assessment:  Plan  Orders for Rash  1. Added 47425 - Ofc Vst, Est Level III [[id number]]

## 2006-12-06 NOTE — Unmapped (Signed)
Signed by Christie Nottingham MD on 12/06/2006 at 00:00:00  Lumbar Steriod injection      Imported By: Marlane Hatcher 12/13/2006 11:36:40    _____________________________________________________________________    External Attachment:    Please see [employer] EMR for this document.

## 2006-12-22 ENCOUNTER — Inpatient Hospital Stay

## 2006-12-25 ENCOUNTER — Inpatient Hospital Stay

## 2006-12-25 NOTE — Unmapped (Signed)
Signed by   LinkLogic on 12/25/2006 at 10:56:03  Patient: [name]  Note: All result statuses are Final unless otherwise noted.    Tests: (1) DIAG-KNEE 1 OR 2-VIEWS LT ([id number])    Order NotePricilla Handler Order Number: [id number]    Order Note:     *** VERIFIED ***  [facility]  Reason:  S/P L TIBIAL PLAT. FX  Dict.Staff: Donne Hazel [phone]    Verified By: Donne Hazel         Ver: 12/25/06  10:55 am  Exams:  DIAG-KNEE 1 OR 2-VIEWS LT      LEFT KNEE PERFORMED ON 12/25/2006:    INDICATION: Fracture.    COMPARISON: 09/25/2006    FINDINGS:    There has been no change.  The previously noted fracture of the  lateral tibial plateau with plate and screw fixation appears to  be satisfactorily healed with slight deformity.  There are no  other abnormalities.    IMPRESSION:    POSTSURGICAL CORRECTION FRACTURE OF THE LATERAL TIBIAL PLATEAU  UNCHANGED SINCE 09/25/2006.  **** end of result ****    Note: An exclamation mark (!) indicates a result that was not dispersed into   the flowsheet.  Document Creation Date: 12/25/2006 10:56 AM  _______________________________________________________________________    (1) Order result status: Final  Collection or observation date-time: 12/25/2006 07:00:00  Requested date-time: 12/25/2006 07:00:00  Receipt date-time:   Reported date-time: 12/25/2006 10:55:47  Referring Physician: Cindi Carbon NON-STAFF  Ordering Physician: Dorothy Spark (LETT)  Specimen Source: L  Source: QRS  Filler Order Number: [id number]  Lab site: [facility]

## 2007-03-07 NOTE — Unmapped (Signed)
Signed by Christie Nottingham MD on 03/07/2007 at 00:00:00  Mammography      Imported By: Marlane Hatcher 03/21/2007 08:00:50    _____________________________________________________________________    External Attachment:    Please see [employer] EMR for this document.

## 2007-03-07 NOTE — Unmapped (Signed)
Signed by Christie Nottingham MD on 03/07/2007 at 00:00:00  Dexa      Imported By: Marlane Hatcher 03/21/2007 08:00:32    _____________________________________________________________________    External Attachment:    Please see [employer] EMR for this document.

## 2007-03-14 ENCOUNTER — Inpatient Hospital Stay

## 2007-04-18 NOTE — Unmapped (Signed)
Signed by Alver Fisher MD on 04/18/2007 at 17:23:58    Phone Note   Patient Call  Call back at Home Phone: [phone]  Caller's Cell Phone #: [phone]    Summary of Call: pt was put on Actonel by her GYN ( due to bone density res ).  She states she had a reaction,  Pressure in esophaghus area,   She states she still has pain when she takes a deep breath,   Pls advise on what to do.      Initial call taken by: Delrae Alfred,  April 18, 2007 3:27 PM      Follow-up for Phone Call   Actonel prescribed by Dr. Rubye Oaks taken Saturday and bad pain in upper mid-chest area. Has gradually improved since then. Some pleuritic component. Dr. Rubye Oaks advised that she call us. I reassured that doesn't sound like heart attack. See tomorrow or Friday if worsens but if improvement trend continues, need not be seen.   Follow-up by: Alver Fisher MD,  April 18, 2007 5:23 PM

## 2007-05-07 ENCOUNTER — Inpatient Hospital Stay

## 2007-05-07 NOTE — Unmapped (Signed)
Signed by   LinkLogic on 05/07/2007 at 14:31:25  Patient: [name]  Note: All result statuses are Final unless otherwise noted.    Tests: (1) DIAG-KNEE 1 OR 2-VIEWS LT ([id number])    Order NotePricilla Handler Order Number: [id number]    Order Note:     *** VERIFIED ***  [facility]  Reason:  S/P LT TIBIAL PLATEAU FX  Dict.Staff: Donne Hazel [phone]    Verified By: Donne Hazel         Ver: 05/07/07   2:31 pm  Exams:  DIAG-KNEE 1 OR 2-VIEWS LT      LEFT KNEE PERFORMED ON 05/07/2007:    INDICATION: Fracture.    COMPARISON: 12/25/2006, 09/25/2006    FINDINGS:    As earlier noted there has been surgical correction of a  fracture involving the lateral tibial plateau with plate and  screw fixation.  The fracture is unchanged over this interval.  There are no other abnormalities.    IMPRESSION:    POSTSURGICAL CORRECTION OF FRACTURE OF THE LATERAL TIBIAL  PLATEAU.  NO CHANGE SINCE 12/25/2006.  **** end of result ****    Note: An exclamation mark (!) indicates a result that was not dispersed into   the flowsheet.  Document Creation Date: 05/07/2007 2:31 PM  _______________________________________________________________________    (1) Order result status: Final  Collection or observation date-time: 05/07/2007 09:53:00  Requested date-time: 05/07/2007 09:53:00  Receipt date-time:   Reported date-time: 05/07/2007 14:31:02  Referring Physician: Cindi Carbon NON-STAFF  Ordering Physician: Dorothy Spark (LETT)  Specimen Source:   Source: QRS  Filler Order Number: [id number]  Lab site: [facility]

## 2007-05-15 NOTE — Unmapped (Signed)
Signed by Arliss Journey Hollowell CMA (AAMA) on 05/15/2007 at 17:07:59    Phone Note   Patient Call  Call back at Home Phone: [phone]    Pharmacy Information: [facility]  [phone]   Summary of Call: Wants RX for propanolol 20 mg. 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     Initial call taken by: Ambrose Pancoast,  May 15, 2007 1:27 PM      New Medications:  * PROPRANOLOL 20MG  1-2 by mouth twice a day    Follow-up for Phone Call   Dr Jimmy Footman can you ok this for Dr Emilie Rutter please. ..................................................................Marland KitchenArliss Journey Hollowell CMA Duncan Dull)  May 15, 2007 1:40 PM      Additional Follow-up for Phone Call   Sure, disp:20, sig: 1-2 by mouth twice a day as needed, 1 refill.  She will also want to make an appointment for a yearly physical however.  Additional Follow-up by: Bonney Aid MD,  May 15, 2007 3:04 PM    Additional Follow-up for Phone Call   rx called and patient informed  phone call completed  Additional Follow-up by: Arliss Journey Hollowell CMA Duncan Dull),  May 15, 2007 5:07 PM    Prescriptions:  PROPRANOLOL 20MG  1-2 by mouth twice a day  #20 x 1   Entered by: Arliss Journey Hollowell CMA (AAMA)   Authorized by: Bonney Aid MD   Signed by: Arliss Journey Hollowell CMA (AAMA) on 05/15/2007   Method used: Telephoned to ...        RxID: [id number]

## 2007-07-31 NOTE — Unmapped (Signed)
Signed by Alver Fisher MD on 07/31/2007 at 15:39:59      Reason for Visit   Chief Complaint: cpe      Allergies  ! IODINE    Medications     Vital Signs:   Wt: 100 lbs.      Wt chg (lbs): 2  Pulse: 76  BP: 100/60    Intake recorded by: Sharma Covert RMA on July 31, 2007 3:07 PM    History of Present Illness   62 yo woman here for checkup. Mostly OK. Has some orthopedic problems - right shoulder and left knee. Reviewed patient's medications - see list. Updated as appropriate. Nonsmoker. Reviewed immunizations - patient has had shingles and will think about vaccine. Preventive care UTD.       Review of Systems   General: Denies anorexia, fatigue, weight loss, weight gain. Exercise - gym regularly.   Eyes: Denies blurring, vision loss.   Ears/Nose/Throat: Denies decreased hearing, dysphagia.   Breast: No lumps. Mamm UTD. Sees Dr. Rubye Oaks.   Cardiovascular: Denies chest pains.   Respiratory: Denies dyspnea, productive cough, dry cough.   Gastrointestinal: Denies change in bowel habits, abdominal pain, hematochezia. Colon about 3 years ago. Normal.   Genitourinary: Denies vaginal discharge, dysuria, urinary frequency, abnormal vaginal bleeding. Some urgency. Gyn care per Dr. Rubye Oaks and UTD.   Musculoskeletal: Right shoulder is a problem. Sees Dr. Lynnda Shields. Left knee is still somewhat sore - sees Dr. Conley Rolls. Otherwise OK.   Skin: Denies rash.   Neurologic: No loss of balance, strength, or sensation.        Physical Examination:   BP: 100/  60    Physical Exam- Detail:   General Appearance: Thin. Appears well and younger than actual age.   Skin: Scattered benign lesions. No rashes.   Ears: Normal canals and TM's.  Oropharynx: Normal mucosa.   Respiratory: Clear.   Neck: Carotids 3/3 without bruits. Nodes normal though easily palpable because thin. Thyroid normal.    Cardiac: Normal.   Vascular: No edema.   Pedal Pulse Left: dorsalis pedis +2, posterior tib +2  Pedal Pulse Right: dorsalis pedis +2,  posterior tib  +2  Abdomen: Bowel sounds present and normal. Abdomen is non-tender and benign. Liver and spleen are not enlarged. There are no masses.             Follow-up for Test Results:     New Problems:  OSTEOPOROSIS (ICD-733.00)  HEALTH MAINTENANCE EXAM (ICD-V70.0)  New Medications:  PREMPRO   TABS (CONJ ESTROG-MEDROXYPROGEST ACE TABS) 1 by mouth daily.  ACTONEL 150 MG  TABS (RISEDRONATE SODIUM) 1 by mouth monthly for osteoporosis.                Assessment and Plan  Status of Existing Problems:  Assessed HEALTH MAINTENANCE EXAM as new - Appears well and has good health habits. Continue current program.  Lab good in the past - we agreed that not needed.  Alver Fisher MD  New Problems:  Dx of OSTEOPOROSIS (ICD-733.00)   Dx of HEALTH MAINTENANCE EXAM (ICD-V70.0)     Medications   New medications:  PREMPRO   TABS -- 1 by mouth daily.  Start date: 07/31/2007  ACTONEL 150 MG  TABS -- 1 by mouth monthly for osteoporosis.  Start date: 07/31/2007      Today's Orders   62376 - Preventive, Est, 40-64 yr [place].Dace                ]

## 2007-07-31 NOTE — Unmapped (Signed)
Signed by Alver Fisher MD on 07/31/2007 at 00:00:00  Privacy Notice      Imported By: Marlane Hatcher 08/01/2007 06:42:23    _____________________________________________________________________    External Attachment:    Please see [employer] EMR for this document.

## 2008-01-09 NOTE — Unmapped (Signed)
[facility]     PATIENT NAME:   [name], [name]                  MR #:  [id number]   DATE OF BIRTH:  06/27/1945                        ACCOUNT #:  [id number]   ADMITTING:      Skipper Cliche, M.D.            ROOM #:   ATTENDING:      Skipper Cliche, M.D.            NURSING UNIT:  [facility]   PRIMARY:        Sindy Messing, M.D.                [facility]:  N   REFERRING:      Skipper Cliche, M.D.            ADMIT DATE:  01/30/2008   DICTATED BY:    Amparo Bristol, CFNP              DISCHARGE DATE:                                  HISTORY & PHYSICAL       DATE OF OR:  01/30/2008.     CHIEF COMPLAINT:  The patient states, they are going to do a removal of   hardware on the left tibial plateau.     HISTORY OF PRESENT ILLNESS:  This is a 62 year old white female who fell off   a stage and tried saving her violin, but fractured her left lower extremity   in 04/2007.  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  She   had surgery at that time with hardware placement.  She continues to have pain   in the left lower extremity with slight swelling of the left knee.  She   sought consult with Dr. Conley Rolls and was told that the hardware needed to be   removed.     REVIEW OF SYSTEMS:  The patient currently denies any chest pain, shortness of   breath, GI or GU complaints.     SOCIAL HISTORY:  The patient does smoke.  She drinks a can of beer a day.   She does not take any recreational drugs.     PAST SURGICAL HISTORY:  Aside from the one above:     1. Cesarean section 24 years ago.     PAST MEDICAL HISTORY:     1. Osteoporosis.     ALLERGIES:     1. Iodine, which causes shortness of breath.     CURRENT MEDICATIONS:     1. Prempro.   2. Actonel.     FAMILY HISTORY:  Not significant.     REVIEW OF SYSTEMS:  Exercise tolerance:  The patient goes to the gym three   times a week.     PHYSICAL EXAMINATION:     VITAL SIGNS:  Blood pressure 99/63, pulse rate 77, oxygen saturation 97%,   temperature  97.9.  Weight 100 pounds.  Height 5 foot, 6 inches tall.   GENERAL:  Well-developed, normal nutrition, no acute distress, looks younger   than stated age.   MENTAL STATUS:  Alert and oriented x 3, cooperative.   COMORBID CONDITIONS:  None.   HEENT:  Normocephalic.  Pupils equal, round and reactive to light.   Oropharynx pink and moist.  Teeth in good repair.   NECK:  Supple.  No lymphadenopathy.   CHEST:  Lungs clear to auscultation and percussion.   BREASTS:  Not assessed.   CARDIAC EXAM:  Regular rate and rhythm.  No murmurs, rubs or gallops.   ABDOMEN:  Soft, positive bowel sounds, nontender, no organomegaly.   RECTAL:  Not assessed.   EXTREMITIES:  No gross deformity.  Positive radial pulses.  No edema,   cyanosis or clubbing.   SKIN:  Warm and dry.   NEUROLOGIC EXAM:  Grossly intact.     IMPRESSION:     1. This is a 62 year old white female status post left tibial plateau painful     hardware, for diagnostic scope removal of hardware, deep left tibial     plateau.     RECOMMENDATION(S):     1. Continue with surgery as planned.                                                       _______________________________________   MB/kll                                 _____   D:  01/09/2008 16:03                  Skipper Cliche, M.D.   T:  01/09/2008 19:28                  Dictated by:  Amparo Bristol, CFNP   Job #:  [id number]                                      HISTORY & PHYSICAL                                                                PAGE    1 of   1   D:  01/09/2008 16:03                  Skipper Cliche, M.D.   T:  01/09/2008 19:28                  Dictated by:  Amparo Bristol, CFNP   Job #:  [id number]                                      HISTORY & PHYSICAL  PAGE    1 of   1

## 2008-01-09 NOTE — Unmapped (Signed)
Signed by   LinkLogic on 01/09/2008 at 19:41:59  Patient: [name]  Note: All result statuses are Final unless otherwise noted.    Tests: (1)  (MR)    Order Note:                                   [facility]     PATIENT NAME:   [name]                  MR #:  [id number]  DATE OF BIRTH:  07/18/1945                        ACCOUNT #:  [id number]  ADMITTING:      Skipper Cliche, M.D.            ROOM #:  ATTENDING:      Skipper Cliche, M.D.            NURSING UNIT:  [facility]  PRIMARY:        Sindy Messing, M.D.                [facility]:  N  REFERRING:      Skipper Cliche, M.D.            ADMIT DATE:  01/30/2008  DICTATED BY:    Amparo Bristol, CFNP              DISCHARGE DATE:                                  HISTORY & PHYSICAL        DATE OF OR:  01/30/2008.     CHIEF COMPLAINT:  The patient states, they are going to do a removal of  hardware on the left tibial plateau.     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  She  had surgery at that time with hardware placement.  She continues to have pain  in the left lower extremity with slight swelling of the left knee.  She  sought consult with Dr. Conley Rolls and was told that the hardware needed to be  removed.     REVIEW OF SYSTEMS:  The patient currently denies any chest pain, shortness of  breath, GI or GU complaints.     SOCIAL HISTORY:  The patient does smoke.  She drinks a can of beer a day.  She does not take any recreational drugs.     PAST SURGICAL HISTORY:  Aside from the one above:     1. Cesarean section 24 years ago.     PAST MEDICAL HISTORY:     1. Osteoporosis.     ALLERGIES:     1. Iodine, which causes shortness of breath.     CURRENT MEDICATIONS:     1. Prempro.  2. Actonel.     FAMILY HISTORY:  Not significant.     REVIEW OF SYSTEMS:  Exercise tolerance:  The  patient goes to the gym three  times a week.     PHYSICAL EXAMINATION:     VITAL SIGNS:  Blood pressure 99/63, pulse  rate 77, oxygen saturation 97%,  temperature 97.9.  Weight 100 pounds.  Height 5 foot, 6 inches tall.  GENERAL:  Well-developed, normal nutrition, no acute distress, looks younger  than stated age.  MENTAL STATUS:  Alert and oriented x 3, cooperative.  COMORBID CONDITIONS:  None.  HEENT:  Normocephalic.  Pupils equal, round and reactive to light.  Oropharynx pink and moist.  Teeth in good repair.  NECK:  Supple.  No lymphadenopathy.  CHEST:  Lungs clear to auscultation and percussion.  BREASTS:  Not assessed.  CARDIAC EXAM:  Regular rate and rhythm.  No murmurs, rubs or gallops.  ABDOMEN:  Soft, positive bowel sounds, nontender, no organomegaly.  RECTAL:  Not assessed.  EXTREMITIES:  No gross deformity.  Positive radial pulses.  No edema,  cyanosis or clubbing.  SKIN:  Warm and dry.  NEUROLOGIC EXAM:  Grossly intact.     IMPRESSION:     1. This is a 62 year old white female status post left tibial plateau painful    hardware, for diagnostic scope removal of hardware, deep left tibial    plateau.     RECOMMENDATION(S):     1. Continue with surgery as planned.                                                             _______________________________________  MB/kll                                 _____  D:  01/09/2008 16:03                  Skipper Cliche, M.D.  T:  01/09/2008 19:28                  Dictated by:  Amparo Bristol, CFNP  Job #:  [id number]                                        HISTORY & PHYSICAL                                                               PAGE    1 of   1    Note: An exclamation mark (!) indicates a result that was not dispersed into   the flowsheet.  Document Creation Date: 01/09/2008 7:41 PM  _______________________________________________________________________    (1) Order result status: Final  Collection or observation date-time: 01/09/2008 00:00  Requested  date-time:   Receipt date-time:   Reported date-time:   Referring Physician: Dorothy Spark  Ordering Physician:  Reviewed In [facility])  Specimen Source:   Source: DBS  Dewain Penning Order Number: [id number]  Lab site:

## 2008-01-30 ENCOUNTER — Inpatient Hospital Stay

## 2008-01-30 NOTE — Unmapped (Signed)
Signed by   LinkLogic on 02/15/2008 at 00:12:27  Patient: [name]  Note: All result statuses are Final unless otherwise noted.    Tests: (1)  (MR)    Order Note:                                   THE [facility]     PATIENT NAME:   [name]                  MR #:  [id number]  DATE OF BIRTH:  [birth year]                        ACCOUNT #:  [id number]  SURGEON:        Skipper Cliche, M.D.            ROOM #:  SDS  SERVICE:        Orthopedic Surgery                NURSING UNIT:  USDS  PRIMARY:        Sindy Messing, M.D.                FC:  N  REFERRING:      Skipper Cliche, M.D.            ADMIT DATE:  01/30/2008  DICTATED BY:    Beatriz Stallion, M.D.             SURGERY DATE:  01/30/2008                                                    DISCHARGE DATE:                                    OPERATIVE REPORT        PREOPERATIVE DIAGNOSES:     1. Left knee pain.  2. Painful hardware, left knee and tibia.     POSTOPERATIVE DIAGNOSES:     1. Left knee pain.  2. Painful hardware, left knee and tibia.     PROCEDURES PERFORMED:     1. Left knee diagnostic arthroscopy.  2. Removal of hardware, left tibia.     SURGEON:  Skipper Cliche, M.D.     ASSISTANT:  Beatriz Stallion, M.D.     COMPLICATIONS:  None.     SPECIMENS:  None.     ESTIMATED BLOOD LOSS:  100 mL.     INDICATIONS:  The patient a 62 year old black female who approximately two  and a half years ago suffered a tibial plateau fracture, Schatzker III.  She  underwent open reduction internal fixation by Dr. Conley Rolls.  Subsequently, she went  on to heal his fracture but has continued to have knee pain and pain along  her hardware.  Because of this, it was felt that it was hard to tell if this  pain was coming from an intraarticular pathology or the hardware.  Because of  that, risks and benefits of the above procedures were discussed with the  patient.  She understood these, wanted to proceed, and signed the consent.     DETAILS  OF PROCEDURE:  The patient was  met in the preoperative holding area  where she was identified as the correct patient.  After receiving  preoperative antibiotics, she was then taken to the operating room and laid  supine on the OR table.  After induction of general anesthesia, the patient's  left lower extremity was prepped and draped in a sterile fashion.  Next, a  lateral portal was made under a standard fashion followed by passing of the  arthroscope trocar into the left knee followed by placement of the  arthroscope.     Details of arthroscopy:  The patient with mild arthritic changes of her  patellofemoral joint and stage I chondromalacia of medial compartment.  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  Notch - the patient's ACL was visualized and seemed to be in good  condition.  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     At this point, under direction visualization, a medial portal was made  followed by passing of an arthroscopic shaver.  At this point, the patient's  meniscus was shaved back to good stable bases.  The hardware was able to be  visualized at the knee joint but was not articulating.  At this point, the  arthroscope was removed.  Next, an incision based over a previous incision  was made.  This was taken down to the layer of the IT band.  The IT band was  opened exposing the hardware.  The hardware was then removed, removed  multiple screws, and then the plate.  At this point, the patient's wound was  thoroughly irrigated.  Layered closure was then performed.  A 0 Vicryl was  used to bring back the fascial layers.  Subcutaneous tissue was brought  together using 2-0 Vicryl in an interrupted fashion and the skin was brought  together using skin staples.  At this point, the patient's leg was cleaned.  She was then extubated and taken to PACU in a stable condition.      Attending physician, Dr. Dorothy Spark, was present for the key and critical  portions of this case including diagnostic arthroscopy with shaving of the  meniscus and removal of hardware.                                                    _______________________________________  SB/sm                                  _____  D:  02/14/2008 13:03                  Skipper Cliche, M.D.  T:  02/14/2008 23:59                  Dictated by:  Beatriz Stallion, M.D.  Job #:  [id number]     c:   Anesthesia                                    OPERATIVE REPORT  PAGE    1 of   1    Note: An exclamation mark (!) indicates a result that was not dispersed into   the flowsheet.  Document Creation Date: 02/15/2008 12:12 AM  _______________________________________________________________________    (1) Order result status: Final  Collection or observation date-time: 01/30/2008 00:00  Requested date-time:   Receipt date-time:   Reported date-time:   Referring Physician: Dorothy Spark  Ordering Physician:  Reviewed In [facility])  Specimen Source:   Source: DBS  Dewain Penning Order Number: [id number]  Lab site:

## 2008-02-15 NOTE — Unmapped (Signed)
THE [facility]     PATIENT NAME:   [name], [name]                  MR #:  [id number]   DATE OF BIRTH:  06/18/1945                        ACCOUNT #:  [id number]   SURGEON:        Skipper Cliche, M.D.            ROOM #:  SDS   SERVICE:        Orthopedic Surgery                NURSING UNIT:  USDS   PRIMARY:        Sindy Messing, M.D.                FC:  N   REFERRING:      Skipper Cliche, M.D.            ADMIT DATE:  01/30/2008   DICTATED BY:    Beatriz Stallion, M.D.             SURGERY DATE:  01/30/2008                                                     DISCHARGE DATE:                                    OPERATIVE REPORT       PREOPERATIVE DIAGNOSES:     1. Left knee pain.   2. Painful hardware, left knee and tibia.     POSTOPERATIVE DIAGNOSES:     1. Left knee pain.   2. Painful hardware, left knee and tibia.     PROCEDURES PERFORMED:     1. Left knee diagnostic arthroscopy.   2. Removal of hardware, left tibia.     SURGEON:  Skipper Cliche, M.D.     ASSISTANT:  Beatriz Stallion, M.D.     COMPLICATIONS:  None.     SPECIMENS:  None.     ESTIMATED BLOOD LOSS:  100 mL.     INDICATIONS:  The patient a 62 year old black female who approximately two   and a half years ago suffered a tibial plateau fracture, Schatzker III.  She   underwent open reduction internal fixation by Dr. Conley Rolls.  Subsequently, she went   on to heal his fracture but has continued to have knee pain and pain along   her hardware.  Because of this, it was felt that it was hard to tell if this   pain was coming from an intraarticular pathology or the hardware.  Because of   that, risks and benefits of the above procedures were discussed with the   patient.  She understood these, wanted to proceed, and signed the consent.     DETAILS OF PROCEDURE:  The patient was met in the preoperative holding area   where she was identified as the correct patient.  After receiving   preoperative antibiotics, she was then taken to  the operating room and laid   supine on the OR table.  After induction of general anesthesia,  the patient's   left lower extremity was prepped and draped in a sterile fashion.  Next, a   lateral portal was made under a standard fashion followed by passing of the   arthroscope trocar into the left knee followed by placement of the   arthroscope.     Details of arthroscopy:  The patient with mild arthritic changes of her   patellofemoral joint and stage I chondromalacia of medial compartment.  The   patient with stage III/IV chondromalacia changes and degenerative meniscal   tear.  Notch - the patient's ACL was visualized and seemed to be in good   condition.  Lateral compartment - the patient with stage IV chondromalacia of   the tibial plateau and lateral femoral condyle with a large tear of the   meniscus.     At this point, under direction visualization, a medial portal was made   followed by passing of an arthroscopic shaver.  At this point, the patient's   meniscus was shaved back to good stable bases.  The hardware was able to be   visualized at the knee joint but was not articulating.  At this point, the   arthroscope was removed.  Next, an incision based over a previous incision   was made.  This was taken down to the layer of the IT band.  The IT band was   opened exposing the hardware.  The hardware was then removed, removed   multiple screws, and then the plate.  At this point, the patient's wound was   thoroughly irrigated.  Layered closure was then performed.  A 0 Vicryl was   used to bring back the fascial layers.  Subcutaneous tissue was brought   together using 2-0 Vicryl in an interrupted fashion and the skin was brought   together using skin staples.  At this point, the patient's leg was cleaned.   She was then extubated and taken to PACU in a stable condition.     Attending physician, Dr. Dorothy Spark, was present for the key and critical   portions of this case including diagnostic arthroscopy with  shaving of the   meniscus and removal of hardware.                                                 _______________________________________   SB/sm                                  _____   D:  02/14/2008 13:03                  Skipper Cliche, M.D.   T:  02/14/2008 23:59                  Dictated by:  Beatriz Stallion, M.D.   Job #:  [id number]     c:   Anesthesia                                    OPERATIVE REPORT  PAGE    1 of   1   T:  02/14/2008 23:59                  Dictated by:  Beatriz Stallion, M.D.   Job #:  [id number]     c:   Anesthesia                                    OPERATIVE REPORT                                                                PAGE    1 of   1

## 2008-03-15 NOTE — Unmapped (Signed)
Signed by Alver Fisher MD on 03/15/2008 at 00:00:00  Mammography      Imported By: Marlane Hatcher 03/25/2008 08:58:15    _____________________________________________________________________    External Attachment:    Please see [facility] EMR for this document.

## 2008-03-17 ENCOUNTER — Inpatient Hospital Stay

## 2008-03-17 NOTE — Unmapped (Signed)
Signed by   LinkLogic on 03/17/2008 at 15:41:18  Patient: [name]  Note: All result statuses are Final unless otherwise noted.    Tests: (1) DIAG-KNEE 1 OR 2-VIEWS LT (161096)    Order Note: Pricilla Handler Order Number: [id number]    Order Note:     *** VERIFIED ***  [facility]  Reason:  S/P LT TIB FX STANDING  Dict.Staff: Donne Hazel [id number]    Verified By: Donne Hazel         Ver: 03/17/08   3:40 pm  Exams:  DIAG-KNEE 1 OR 2-VIEWS LT      LEFT KNEE PERFORMED ON 03/17/2008:    INDICATION: Fracture.    COMPARISON: 05/07/2007    FINDINGS:    In the interval the plate and screw fixation of the tibial  plateau fracture has been removed.  There is marked irregularity  of the lateral tibial plateau as well as changes of the proximal  tibia in this same area consistent with osteoarthrosis and post  traumatic change.  There is mild osteoarthrosis involving both  the medial and anterior compartments.    IMPRESSION:    1. POSTSURGICAL REMOVAL OF THE PLATE AND SCREW FIXATION OF THE  LATERAL TIBIAL PLATEAU FRACTURE WITH RESIDUAL CHANGES IN THE  TIBIAL PLATEAU AND THE UNDERLYING TIBIA.    2. 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  **** end of result ****    Order Note:   EMR Routing to: Alver Fisher - copy to - [id number]  EMR Routing to: Kyana Aicher, Madlyn Frankel, MD - ordering - [id number]    Note: An exclamation mark (!) indicates a result that was not dispersed into   the flowsheet.  Document Creation Date: 03/17/2008 3:41 PM  _______________________________________________________________________    (1) Order result status: Final  Collection or observation date-time: 03/17/2008 08:29:00  Requested date-time: 03/17/2008 08:29:00  Receipt date-time:   Reported date-time: 03/17/2008 15:40:48  Referring Physician: Dorothy Spark  Ordering Physician: Dorothy Spark (LETT)  Specimen Source:   Source: QRS  Filler Order Number: [id number]  Lab site: [facility]

## 2008-04-19 ENCOUNTER — Observation Stay: Admit: 2008-04-19 | Discharge: 2008-04-19 | Disposition: A | Discharge status: Home / Self Care

## 2008-04-19 NOTE — Unmapped (Signed)
THE [facility]     PATIENT NAME:   [name], [name]                  MR #:  [id number]   DATE OF BIRTH:  Nov 03, 1945                        ACCOUNT #:  [id number]   ED PHYSICIAN:   Purcell Mouton, M.D.            ROOM #:  TCU   PRIMARY:        Sindy Messing, M.D.                NURSING UNIT:  UTCU   REFERRING:      Selected Referral Pt              FC:  N   DICTATED BY:    Shelbie Ammons. Meiman, CNP             ADMIT DATE:  04/19/2008   VISIT DATE:     04/19/2008                        DISCHARGE DATE:                           EMERGENCY DEPARTMENT ADMISSION NOTE       This patient has been cared for according to the standard RDTC protocol for   angioedema.  Significant events during the course of this observation include   treatment for her angioedema on admission with Solu-Medrol, Benadryl, Ativan   and Benadryl IV.  During the night this patient's edema around her lips has   begun to recede.  The patient tolerated her breakfast well.  This extended   period of observation was specifically required to determine the need for   hospitalization.  This patient, we needed to observe her for worsening   symptoms, after her initial steroid injection along with her IV Benadryl.  We   wanted to observe to make sure she did not develop stridor or voice change.   This patient is stable for discharge based on the following diagnostic   criteria.  She has normal stable vital signs.  She has no stridor, voice   change or any progressive oropharyngeal edema.  Her oropharynx is without   edema at this time.  She continues to have some edema to her chin and lips   which is improving.  She is tolerating po fluids and medications without   difficulty.  She has a primary care physician for follow-up and her pulse ox   remains 100% on room air.  Her blood pressure is 110/76, pulse 86,   respiratory rate 12 and once again, pulse ox 100% on room air.     Total observation time was nine hours.      DISPOSITION:  The patient will be discharged to home.     PLAN:     1. The patient was given a prescription for 40 mg of prednisone po daily x 4     days,  Pepcid 20 mg one tablet po every 12 hours x 4 days.   2. She was given a prescription for Benadryl 25 mg one to two tablets every     four hours.  She was also given a prescription for EpiPen.  She is to keep  the EpiPen with her at all times.   3. She is to return for worsening symptoms which would increase the shortness     of breath, difficulty breathing, throat tightness.   4. She is to take her medications as directed.   5. She is to follow-up with her primary care physician for any further     concerns.     I presented this patient to Dr. Nunzio Cory, who saw the patient and agreed with my   assessment and plan.  The patient was discharged to home under stable   condition, being driven by her husband.                                                 _______________________________________   LHM/sb                                 _____   D:  04/19/2008 11:09                  Shelbie Ammons. Georgiann Cocker, CNP   T:  04/19/2008 11:44   Job #:  [id number]                                         _______________________________________                                          _____                                         Purcell Mouton, M.D.                             EMERGENCY DEPARTMENT ADMISSION NOTE                                                                PAGE    1 of   1                                                                PAGE    1 of   1

## 2008-04-19 NOTE — Unmapped (Signed)
THE [facility]     PATIENT NAME:   [name], [name]                  MR #:  [id number]   DATE OF BIRTH:  26-Mar-1946                        ACCOUNT #:  [id number]   ED PHYSICIAN:   Earlean Shawl. Mora Bellman, M.D.           ROOM #:  TCU   PRIMARY:        Sindy Messing, M.D.                NURSING UNIT:  UTCU   REFERRING:      Selected Referral Pt              FC:  N   DICTATED BY:    Diamante Truszkowski B. Alla German, M.D.          ADMIT DATE:  04/19/2008   VISIT DATE:     04/19/2008                        DISCHARGE DATE:                           EMERGENCY DEPARTMENT ADMISSION NOTE       RDTC NOTE:     I am dictating on behalf of attending physician Dr. Mora Bellman for this patient   who is in the angioedema protocol.  The patient woke up with some swelling   today, has not had any respiratory distress.  Her swelling was on her face   and lips.  She had received Benadryl and Solu-Medrol and some Pepcid prior to   being ruled on the protocol and she states that she thinks her swelling has   decreased.  This extended period of observation is specifically required to   determine the need for hospitalization.  The patient will be observed for   signs of respiratory distress or worsening of her swelling.  We will observe   the patient for the as-mentioned endpoints.  When met, the patient's   appropriate disposition will be arranged.                                                 _______________________________________   RBP/sw                                 _____   D:  04/19/2008 05:51                  Elzie Sheets B. Alla German, M.D.   T:  04/19/2008 06:26   Job #:  [id number]                                         _______________________________________                                          _____  Donald A. Mora Bellman, M.D.                             EMERGENCY DEPARTMENT ADMISSION NOTE                                                                PAGE    1 of   1                              EMERGENCY DEPARTMENT ADMISSION NOTE                                                                PAGE    1 of   1

## 2008-04-19 NOTE — Unmapped (Signed)
Signed by   LinkLogic on 05/05/2008 at 11:35:38  Patient: [name]  Note: All result statuses are Final unless otherwise noted.    Tests: (1)  (MR)    Order Note:                                   THE [facility]     PATIENT NAME:   [name], [name]                  MR #:  [id number]  DATE OF BIRTH:  06/23/1945                        ACCOUNT #:  [id number]  ED PHYSICIAN:   Purcell Mouton, M.D.            ROOM #:  TCU  PRIMARY:        Sindy Messing, M.D.                NURSING UNIT:  UTCU  REFERRING:      Selected Referral Pt              FC:  C  DICTATED BY:    Purcell Mouton, M.D.            ADMIT DATE:  04/19/2008  VISIT DATE:     04/19/2008                        DISCHARGE DATE:  04/19/2008                           EMERGENCY DEPARTMENT DISCHARGE NOTE        THIS IS AN ADDENDUM.   05/05/08   CSD     This patient has been cared for according to standard RDTC observation  protocol for angioedema.  This period of observation was required to  determine the need for hospitalization.  The patient has been determined to  be stable for discharge; I have seen and examined the patient, discussed and  agree with the mid-level provider's plan.                                                       _______________________________________  MSL/csd                                _____  D:  05/05/2008 08:13                  Purcell Mouton, M.D.  T:  05/05/2008 11:27  Job #:  [id number]                                 EMERGENCY DEPARTMENT DISCHARGE NOTE  PAGE    1 of   1    Note: An exclamation mark (!) indicates a result that was not dispersed into   the flowsheet.  Document Creation Date: 05/05/2008 11:35 AM  _______________________________________________________________________    (1) Order result status: Final  Collection or observation date-time: 04/19/2008 00:00  Requested date-time:   Receipt date-time:   Reported date-time:   Referring Physician:  Selected Pt  Ordering Physician:  Reviewed In Hospital [facility])  Specimen Source:   Source: DBS  Filler Order Number: [id number]  Lab site:

## 2008-04-19 NOTE — Unmapped (Signed)
Signed by   LinkLogic on 04/19/2008 at 06:41:27  Patient: [name]  Note: All result statuses are Final unless otherwise noted.    Tests: (1)  (MR)    Order Note:                                   THE [facility]     PATIENT NAME:   [name], [name]                  MR #:  [id number]  DATE OF BIRTH:  [birth year]                        ACCOUNT #:  [id number]  ED PHYSICIAN:   Earlean Shawl. Mora Bellman, M.D.           ROOM #:  TCU  PRIMARY:        Sindy Messing, M.D.                NURSING UNIT:  UTCU  REFERRING:      Selected Referral Pt              FC:  N  DICTATED BY:    Rachelle B. Alla German, M.D.          ADMIT DATE:  04/19/2008  VISIT DATE:     04/19/2008                        DISCHARGE DATE:                           EMERGENCY DEPARTMENT ADMISSION NOTE        RDTC NOTE:     I am dictating on behalf of attending physician Dr. Mora Bellman for this patient  who is in the angioedema protocol.  The patient woke up with some swelling  today, has not had any respiratory distress.  Her swelling was on her face  and lips.  She had received Benadryl and Solu-Medrol and some Pepcid prior to  being ruled on the protocol and she states that she thinks her swelling has  decreased.  This extended period of observation is specifically required to  determine the need for hospitalization.  The patient will be observed for  signs of respiratory distress or worsening of her swelling.  We will observe  the patient for the as-mentioned endpoints.  When met, the patient's  appropriate disposition will be arranged.                                                    _______________________________________  RBP/sw                                 _____  D:  04/19/2008 05:51                  Rachelle B. Alla German, M.D.  T:  04/19/2008 06:26  Job #:  [id number]  _______________________________________                                         _____                                        Earlean Shawl Mora Bellman, M.D.                              EMERGENCY DEPARTMENT ADMISSION NOTE                                                               PAGE    1 of   1    Note: An exclamation mark (!) indicates a result that was not dispersed into   the flowsheet.  Document Creation Date: 04/19/2008 6:41 AM  _______________________________________________________________________    (1) Order result status: Final  Collection or observation date-time: 04/19/2008 00:00  Requested date-time:   Receipt date-time:   Reported date-time:   Referring Physician: Selected Pt  Ordering Physician:  Reviewed In Hospital [facility])  Specimen Source:   Source: DBS  Filler Order Number: [id number]  Lab site:

## 2008-04-19 NOTE — Unmapped (Signed)
THE [facility]     PATIENT NAME:   [name] [name]                  MR #:  [id number]   DATE OF BIRTH:  1945/09/18                        ACCOUNT #:  [id number]   ED PHYSICIAN:   Earlean Shawl. Mora Bellman, M.D.           ROOM #:  TCU   PRIMARY:        Sindy Messing, M.D.                NURSING UNIT:  UTCU   REFERRING:      Selected Referral Pt              FC:  N   DICTATED BY:    Donzetta Sprung, M.D.                  ADMIT DATE:  04/19/2008   VISIT DATE:                                       DISCHARGE DATE:                           EMERGENCY DEPARTMENT ADMISSION NOTE       CHIEF COMPLAINT:  Facial swelling.     HISTORY OF PRESENT ILLNESS:  This is a 62 year old female, otherwise healthy,   who  came to the emergency department complaining of facial swelling that   started about quarter after 11 this evening.  The patient said she cannot   think of anything that triggered this episode.  She denied any exposure to   any new medications.  She says she is currently on Premarin and the dosage   has not been adjusted.  She also cannot think of any environmental trigger.   The patient said the swelling first started in her lower left and now her   entire jaw is swollen.  The patient denied any sensation of her throat is   swelling or sensation of difficulty breathing.  The patient denied any pain   associated with this.  She also denied rash.     REVIEW OF SYSTEMS:  Pertinent positive and negative as described above.  In   addition, the patient denied fever or chills.  She denied chest pain.  She   denies shortness of breath.  She denied abdominal pain.  She has no nausea or   vomiting.  All other systems reviewed and are negative.     PAST MEDICAL HISTORY:     1. History of hardware placed in left tib-fib, status post removal.   2. Osteoporosis.     ALLERGIES:     1.  Iodine which causes shortness of breath.     MEDICATIONS:     1. Prempro.   2. Advil once in a while prn as needed for pain.     SOCIAL HISTORY:  The patient denies use of tobacco, alcohol or drugs.     FAMILY HISTORY:  No family history of angioedema,     PHYSICAL EXAMINATION:     VITAL SIGNS:  Blood pressure is 175/106, pulse is 90, respiratory rate is 14,   temperature is 98.5.  O2 sat is 98% on room air.   GENERAL:  This is a well-developed, well-nourished female who appears   slightly anxious, but otherwise she is awake, alert.  She does have pretty   significant facial edema, but she is in no respiratory distress.  Her of   breathing is not increased.   HEENT:  Head normocephalic.  The patient has significant lower lip edema as   well as lower jaw edema.  Eyes:  Pupils equal, round, reactive to light.   Extraocular motion intact.  Oropharynx is clear.  The patient has no tongue   swelling.  Her soft palate symmetrical without any evidence of edema.  Uvula   is midline.   NECK:  Trachea is midline.  No cervical lymphadenopathy.   RESPIRATORY:  Clear to auscultation bilaterally.  No stridor, no wheezing.   CARDIOVASCULAR:  Regular rate and rhythm.  Normal S2.  No gallops, rub or   murmur.   ABDOMEN:  Soft, nondistended, nontender on palpation.  No rebound tenderness,   no guarding, no mass palpated.   MUSCULOSKELETAL:  The patient has no obvious long bone deformity her previous   surgical scar over the left tib-fib area noted is clean, dry and intact.   SKIN:  Warm and dry, no rash, no petechia.   NEUROLOGIC:  The patient moves all four extremities well.  Cranial nerves II   through XII are grossly intact.  No focal neurologic deficit found.     EMERGENCY DEPARTMENT COURSE:  The patient is brought back to SRU 1 where   patient's history and physical was obtained.  The patient's case was   discussed with attending, Dr. Marica Otter, who agrees with patient's   assessment and plan.  IV was established.  Labs were drawn.  The patient   received 125 mg Solu-Medrol IV, 40 mg Pepcid IV and 50 mg of Benadryl IV.   Later the patient also got 1  mg Ativan IV for her anxiety.     LABORATORY RESULTS:  The patient's CBC and renal panel are all unremarkable.     MEDICAL DECISION MAKING:  This is a 62 year old lady, previously healthy, who   came into the emergency department with facial swelling that is most   consistent angioedema.  The patient was treated accordingly.  After an hour   the patient's lip might be just slightly less swollen compared to what she   presented with, however, she still had very significant jaws swelling.  The   patient otherwise was in no respiratory distress and never had any stridor.   She is otherwise clinically stable.  Therefore, I do believe this patient   will be a great candidate for our RDTC protocol.     FINAL DIAGNOSIS:     1.  Angioedema.     FINAL DISPOSITION:  The patient is admitted to [facility] with angioedema protocol.                                                 _______________________________________   SW/anr                                 _____   D:  04/19/2008 03:41  Donzetta Sprung, M.D.   T:  04/19/2008 04:18   Job #:  [id number]                                         _______________________________________                                          _____                                         Earlean Shawl. Mora Bellman, M.D.                             EMERGENCY DEPARTMENT ADMISSION NOTE                                                                PAGE    1 of   1                                         Donald A. Mora Bellman, M.D.                             EMERGENCY DEPARTMENT ADMISSION NOTE                                                                PAGE    1 of   1

## 2008-04-19 NOTE — Unmapped (Signed)
Signed by   LinkLogic on 04/19/2008 at 04:27:45  Patient: [name]  Note: All result statuses are Final unless otherwise noted.    Tests: (1)  (MR)    Order Note:                                   [facility]     PATIENT NAME:   [name]                  MR #:  [id number]  DATE OF BIRTH:  01/29/1946                        ACCOUNT #:  [id number]  ED PHYSICIAN:   Earlean Shawl. Mora Bellman, M.D.           ROOM #:  TCU  PRIMARY:        Sindy Messing, M.D.                NURSING UNIT:  UTCU  REFERRING:      Selected Referral Pt              FC:  N  DICTATED BY:    Donzetta Sprung, M.D.                  ADMIT DATE:  04/19/2008  VISIT DATE:                                       DISCHARGE DATE:                           EMERGENCY DEPARTMENT ADMISSION NOTE        CHIEF COMPLAINT:  Facial swelling.     HISTORY OF PRESENT ILLNESS:  This is a 62 year old female, otherwise healthy,  who  came to the emergency department complaining of facial swelling that  started about quarter after 11 this evening.  The patient said she cannot  think of anything that triggered this episode.  She denied any exposure to  any new medications.  She says she is currently on Premarin and the dosage  has not been adjusted.  She also cannot think of any environmental trigger.  The patient said the swelling first started in her lower left and now her  entire jaw is swollen.  The patient denied any sensation of her throat is  swelling or sensation of difficulty breathing.  The patient denied any pain  associated with this.  She also denied rash.     REVIEW OF SYSTEMS:  Pertinent positive and negative as described above.  In  addition, the patient denied fever or chills.  She denied chest pain.  She  denies shortness of breath.  She denied abdominal pain.  She has no nausea or  vomiting.  All other systems reviewed and are negative.     PAST MEDICAL HISTORY:     1. History of hardware placed in left tib-fib, status post removal.  2.  Osteoporosis.     ALLERGIES:     1.  Iodine which causes shortness of breath.     MEDICATIONS:     1. Prempro.  2. Advil once in a while prn as needed for pain.     SOCIAL HISTORY:  The patient denies use of tobacco, alcohol or drugs.     FAMILY HISTORY:  No family history of angioedema,     PHYSICAL EXAMINATION:     VITAL SIGNS:  Blood pressure is 175/106, pulse is 90, respiratory rate is 14,  temperature is 98.5.  O2 sat is 98% on room air.  GENERAL:  This is a well-developed, well-nourished female who appears  slightly anxious, but otherwise she is awake, alert.  She does have pretty  significant facial edema, but she is in no respiratory distress.  Her of  breathing is not increased.  HEENT:  Head normocephalic.  The patient has significant lower lip edema as  well as lower jaw edema.  Eyes:  Pupils equal, round, reactive to light.  Extraocular motion intact.  Oropharynx is clear.  The patient has no tongue  swelling.  Her soft palate symmetrical without any evidence of edema.  Uvula  is midline.  NECK:  Trachea is midline.  No cervical lymphadenopathy.  RESPIRATORY:  Clear to auscultation bilaterally.  No stridor, no wheezing.  CARDIOVASCULAR:  Regular rate and rhythm.  Normal S2.  No gallops, rub or  murmur.  ABDOMEN:  Soft, nondistended, nontender on palpation.  No rebound tenderness,  no guarding, no mass palpated.  MUSCULOSKELETAL:  The patient has no obvious long bone deformity her previous  surgical scar over the left tib-fib area noted is clean, dry and intact.  SKIN:  Warm and dry, no rash, no petechia.  NEUROLOGIC:  The patient moves all four extremities well.  Cranial nerves II  through XII are grossly intact.  No focal neurologic deficit found.     EMERGENCY DEPARTMENT COURSE:  The patient is brought back to SRU 1 where  patient's history and physical was obtained.  The patient's case was  discussed with attending, Dr. Marica Otter, who agrees with patient's  assessment and plan.  IV was  established.  Labs were drawn.  The patient  received 125 mg Solu-Medrol IV, 40 mg Pepcid IV and 50 mg of Benadryl IV.  Later the patient also got 1 mg Ativan IV for her anxiety.     LABORATORY RESULTS:  The patient's CBC and renal panel are all unremarkable.     MEDICAL DECISION MAKING:  This is a 62 year old lady, previously healthy, who  came into the emergency department with facial swelling that is most  consistent angioedema.  The patient was treated accordingly.  After an hour  the patient's lip might be just slightly less swollen compared to what she  presented with, however, she still had very significant jaws swelling.  The  patient otherwise was in no respiratory distress and never had any stridor.  She is otherwise clinically stable.  Therefore, I do believe this patient  will be a great candidate for our RDTC protocol.     FINAL DIAGNOSIS:     1.  Angioedema.     FINAL DISPOSITION:  The patient is admitted to [facility] with angioedema protocol.                                                    _______________________________________  SW/anr  _____  D:  04/19/2008 03:41                  Donzetta Sprung, M.D.  T:  04/19/2008 04:18  Job #:  [id number]                                        _______________________________________                                         _____                                        Earlean Shawl. Mora Bellman, M.D.                              EMERGENCY DEPARTMENT ADMISSION NOTE                                                               PAGE    1 of   1    Note: An exclamation mark (!) indicates a result that was not dispersed into   the flowsheet.  Document Creation Date: 04/19/2008 4:27 AM  _______________________________________________________________________    (1) Order result status: Final  Collection or observation date-time: 04/19/2008 00:00  Requested date-time:   Receipt date-time:   Reported date-time:   Referring Physician: Selected Pt  Ordering  Physician:  Reviewed In [facility])  Specimen Source:   Source: DBS  Filler Order Number: [id number]  Lab site:

## 2008-04-19 NOTE — Unmapped (Signed)
Signed by   LinkLogic on 04/19/2008 at 11:56:53  Patient: [name]  Note: All result statuses are Final unless otherwise noted.    Tests: (1)  (MR)    Order Note:                                   [facility]     PATIENT NAME:   [name], [name]                  MR #:  [id number]  DATE OF BIRTH:  July 02, 1945                        ACCOUNT #:  [id number]  ED PHYSICIAN:   Purcell Mouton, M.D.            ROOM #:  TCU  PRIMARY:        Sindy Messing, M.D.                NURSING UNIT:  UTCU  REFERRING:      Selected Referral Pt              FC:  N  DICTATED BY:    Shelbie Ammons. Meiman, CNP             ADMIT DATE:  04/19/2008  VISIT DATE:     04/19/2008                        DISCHARGE DATE:                           EMERGENCY DEPARTMENT ADMISSION NOTE        This patient has been cared for according to the standard RDTC protocol for  angioedema.  Significant events during the course of this observation include  treatment for her angioedema on admission with Solu-Medrol, Benadryl, Ativan  and Benadryl IV.  During the night this patient's edema around her lips has  begun to recede.  The patient tolerated her breakfast well.  This extended  period of observation was specifically required to determine the need for  hospitalization.  This patient, we needed to observe her for worsening  symptoms, after her initial steroid injection along with her IV Benadryl.  We  wanted to observe to make sure she did not develop stridor or voice change.  This patient is stable for discharge based on the following diagnostic  criteria.  She has normal stable vital signs.  She has no stridor, voice  change or any progressive oropharyngeal edema.  Her oropharynx is without  edema at this time.  She continues to have some edema to her chin and lips  which is improving.  She is tolerating po fluids and medications without  difficulty.  She has a primary care physician for follow-up and her pulse ox  remains 100% on room air.  Her blood  pressure is 110/76, pulse 86,  respiratory rate 12 and once again, pulse ox 100% on room air.     Total observation time was nine hours.     DISPOSITION:  The patient will be discharged to home.     PLAN:     1. The patient was given a prescription for 40 mg of prednisone po daily x 4    days,  Pepcid 20 mg  one tablet po every 12 hours x 4 days.  2. She was given a prescription for Benadryl 25 mg one to two tablets every    four hours.  She was also given a prescription for EpiPen.  She is to keep    the EpiPen with her at all times.  3. She is to return for worsening symptoms which would increase the shortness    of breath, difficulty breathing, throat tightness.  4. She is to take her medications as directed.  5. She is to follow-up with her primary care physician for any further    concerns.     I presented this patient to Dr. Nunzio Cory, who saw the patient and agreed with my  assessment and plan.  The patient was discharged to home under stable  condition, being driven by her husband.                                                    _______________________________________  LHM/sb                                 _____  D:  04/19/2008 11:09                  Shelbie Ammons. Georgiann Cocker, CNP  T:  04/19/2008 11:44  Job #:  [id number]                                        _______________________________________                                         _____                                        Purcell Mouton, M.D.                              EMERGENCY DEPARTMENT ADMISSION NOTE                                                               PAGE    1 of   1    Note: An exclamation mark (!) indicates a result that was not dispersed into   the flowsheet.  Document Creation Date: 04/19/2008 11:56 AM  _______________________________________________________________________    (1) Order result status: Final  Collection or observation date-time: 04/19/2008 00:00  Requested date-time:   Receipt date-time:   Reported date-time:   Referring  Physician: Selected Pt  Ordering Physician:  Reviewed In [facility])  Specimen Source:   Source: DBS  Filler Order Number: [id number]  Lab site:

## 2008-05-05 NOTE — Unmapped (Signed)
[facility]     PATIENT NAME:   [name], [name]                  MR #:  [id number]   DATE OF BIRTH:  1946-05-13                        ACCOUNT #:  [id number]   ED PHYSICIAN:   Purcell Mouton, M.D.            ROOM #:  TCU   PRIMARY:        Sindy Messing, M.D.                NURSING UNIT:  UTCU   REFERRING:      Selected Referral Pt              FC:  C   DICTATED BY:    Purcell Mouton, M.D.            ADMIT DATE:  04/19/2008   VISIT DATE:     04/19/2008                        DISCHARGE DATE:  04/19/2008                           EMERGENCY DEPARTMENT DISCHARGE NOTE       THIS IS AN ADDENDUM.   05/05/08   CSD     This patient has been cared for according to standard RDTC observation   protocol for angioedema.  This period of observation was required to   determine the need for hospitalization.  The patient has been determined to   be stable for discharge; I have seen and examined the patient, discussed and   agree with the mid-level provider's plan.                                                   _______________________________________   MSL/csd                                _____   D:  05/05/2008 08:13                  Purcell Mouton, M.D.   T:  05/05/2008 11:27   Job #:  [id number]                               EMERGENCY DEPARTMENT DISCHARGE NOTE                                                                PAGE    1 of   1   MSL/csd                                _____   D:  05/05/2008 08:13  Purcell Mouton, M.D.   T:  05/05/2008 11:27   Job #:  [id number]                               EMERGENCY DEPARTMENT DISCHARGE NOTE                                                                PAGE    1 of   1

## 2008-05-23 NOTE — Unmapped (Signed)
Signed by [name] MD on 05/23/2008 at 14:20:38      Reason for Visit   Chief Complaint: Sick visit    History from: patient    Allergies  ! IODINE    Medications     Vital Signs:   Wt: 98 lbs.      Wt chg (lbs): -2  Temperature: 98.7  degrees F  oral  Pulse: 88    Patient appears to be in acute distress: no  BP: 103/60    Intake recorded by: Philipp Ovens MA on May 23, 2008 1:48 PM      History of Present Illness: had angioedema a few wks ago and admitted to hosp  now past few d wonders if possibly some swelling L cheek  region  also notes a lot of knee swelling and sees ortho for this           Physical Examination:   BP: 103/  60    Physical Exam- Detail:   General Appearance: nad, thin  Nose/Face: mild infra-orbital edema, no redness/warmth/tend.  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 d/o predisposing - [name] MD  New Problems:  Dx of ANGIOEDEMA (ICD-995.1)  Onset: 05/23/2008    Medications   New Prescriptions/Refills:  CLARINEX 5 MG TABS (DESLORATADINE) 1 by mouth daily  #samples x 0, 05/23/2008, [name] MD    Today's Orders   C1 Inhibitor, antigenic [CPT-83520]  C-1 Esterase Inhibitor (C1ESTP) (297) [CPT-86161/86160]  Complement  C4  (C4) (353) [CPT-86160]  C1Q complement level [CPT-86160]  99213 - Ofc Vst, Est Level III [[id number]]                      Process Orders  Check Orders  Results:      [facility]: ABN not required for this insurance  Not all tests in this order have been sent for requisitioning  Pending tests not yet sent for requisitioning:      05/23/2008: [facility] -- C-1 Esterase Inhibitor (C1ESTP) (297) [CPT-86161/86160] (signed)      05/23/2008: [facility] -- Complement  C4  (C4) (353) [CPT-86160] (signed)

## 2008-05-28 LAB — C1 ESTERASE INHIBITOR PANEL: C1 Esterase Inhibitor Quant: 24 mg/dL (ref 21–39)

## 2008-05-28 LAB — C4 COMPLEMENT: C4 Complement: 26 mg/dL (ref 9–36)

## 2008-05-28 NOTE — Unmapped (Signed)
Signed by [name] MD on 06/09/2008 at 16:02:30  Patient: [name]  Note: All result statuses are Final unless otherwise noted.  Patient Note: PATIENT NOT FASTING    Tests: (1) C1 Esterase Inhibitor, Func (604540)  ! C1 Est.Inhib.Funct.       100 % mean normal                                        Abnormal       <41                                        Equivocal  41 - 67                                        Normal         >67          Note: An exclamation mark (!) indicates a result that was not dispersed into   the flowsheet.  Document Creation Date: 06/03/2008 4:17 PM  _______________________________________________________________________    (1) Order result status: Final  Collection or observation date-time: 05/28/2008 10:47  Requested date-time:   Receipt date-time: 05/28/2008 17:23  Reported date-time: 06/03/2008 16:09  Referring Physician:    Ordering Physician: Homero Fellers (parthan)  Specimen Source:   Source: Leverne Humbles Order Number: [id number] LAB  Lab site: Performed At:  [facility] [address]  [place],   [place] [id number] Mila Homer MD     Phone:  [phone]

## 2008-05-28 NOTE — Unmapped (Signed)
Signed by [name] MD on 06/09/2008 at 16:02:00  Patient: [name]  Note: All result statuses are Final unless otherwise noted.  Patient Note: PATIENT NOT FASTING    Tests: (1) C1 Esterase Inhibitor, Serum (403474)   C1 Esterase Inhibitor, Serum                              24 mg/dL                    (25-95)    Note: An exclamation mark (!) indicates a result that was not dispersed into   the flowsheet.  Document Creation Date: 06/02/2008 2:32 PM  _______________________________________________________________________    (1) Order result status: Final  Collection or observation date-time: 05/28/2008 10:47  Requested date-time:   Receipt date-time: 05/28/2008 17:23  Reported date-time: 05/30/2008 07:07  Referring Physician:    Ordering Physician: Homero Fellers (parthan)  Specimen Source:   Source: Leverne Humbles Order Number: [id number] LAB  Lab site: Performed At:  [facility] [address]  [place],   [place] [id number] Mila Homer MD     Phone:  [phone]

## 2008-05-28 NOTE — Unmapped (Signed)
Signed by [name] MD on 06/09/2008 at 16:02:00  Patient: [name]  Note: All result statuses are Final unless otherwise noted.  Patient Note: PATIENT NOT FASTING    Tests: (1) Complement C1q, Quantitative (161096)  ! Complement C1q, Quantitative                              22.6 mg/dL                  (04.5-40.4)    Note: An exclamation mark (!) indicates a result that was not dispersed into   the flowsheet.  Document Creation Date: 06/09/2008 2:17 PM  _______________________________________________________________________    (1) Order result status: Final  Collection or observation date-time: 05/28/2008 10:47  Requested date-time:   Receipt date-time: 05/28/2008 17:23  Reported date-time: 06/09/2008 14:07  Referring Physician:    Ordering Physician: Homero Fellers (parthan)  Specimen Source:   Source: Leverne Humbles Order Number: [id number] LAB  Lab site: Performed At:  [facility] [address]  [place],   [place] [id number] Mila Homer MD     Phone:  [phone]

## 2008-05-28 NOTE — Unmapped (Signed)
Signed by [name] MD on 05/29/2008 at 16:19:52  Patient: [name]  Note: All result statuses are Final unless otherwise noted.  Patient Note: PATIENT NOT FASTING    Tests: (1) Complement C4, Serum (657846)    Complement C4, Serum      26 mg/dL Adult              (9-62)    Note: An exclamation mark (!) indicates a result that was not dispersed into   the flowsheet.  Document Creation Date: 05/29/2008 7:27 AM  _______________________________________________________________________    (1) Order result status: Final  Collection or observation date-time: 05/28/2008 10:47  Requested date-time:   Receipt date-time: 05/28/2008 17:23  Reported date-time: 05/29/2008 07:11  Referring Physician:    Ordering Physician: Homero Fellers (parthan)  Specimen Source:   Source: Leverne Humbles Order Number: [id number] LAB  Lab site: Performed At:  [facility] [address]  [place], [place]   [id number] Kalman Jewels MD     Phone:  [phone]      -----------------    The following lab values were dispersed to the flowsheet  with no units conversion:      Complement C4, Serum, 26 MG/DL ADULT, (F)  expected units: mg/dL

## 2008-06-09 NOTE — Unmapped (Signed)
Signed by [name] MD on 06/09/2008 at 16:03:30            [name], M.D.  [address]  [address]  [place], [place] [address]  Phone [phone]  Fax [phone]      June 09, 2008      [name]  [address]    [place], [place] [address]                                                                                           RE:  TEST RESULTS   ([name]--[birth year])         Dear Ms. Laural Benes:      The following is an interpretation of your most recent tests.  Please take note of any instructions provided.      Other lab results: The labs don't show any metabolic problem that may be predisposing you to the angioedema; good news.       Hope you're feeling better,        [name] MD

## 2008-07-02 NOTE — Unmapped (Signed)
Signed by Gilman Schmidt MD on 07/02/2008 at 09:34:55      Reason for Visit   Chief Complaint: swelling on L side of face. Started this AM, given herself an Epi pen shot.   Had this on Nov 14th and went to ER then in which they gave her an Rx for the Epe-pen.        Allergies  ! IODINE    Medications     Vital Signs:   Wt: 96 lbs.      Wt chg (lbs): -2  Temperature: 97.0  degrees F Pulse: 80  BP: 144/80    Intake recorded by: Daun Peacock CMA (AAMA) on July 02, 2008 9:19 AM    History of Present Illness   angioedema    History of Present Illness: Angioedema- had an episode of left facial swelling and infraorbital edema; no SOB or tongue swelling; she took an epinephrine shot at the start of     had an episode in 11/09 of angioedema; she also had an episode of angioedema; no tongue swelling;            Physical Examination:   BP: 144/  80    Physical Exam- Detail:   Oropharynx: edema of left cheek and peri-orbital region; no swelling of oropharynx;            New Medications:  RANITIDINE HCL 150 MG CAPS (RANITIDINE HCL) one by mouth twice a day  *                Prescriptions:  MEDROL (PAK) 4 MG TAB (METHYLPREDNISOLONE) use as directed  #1 x 0   Entered and Authorized by: Gilman Schmidt MD   Signed by: Gilman Schmidt MD on 07/02/2008   Method used: Print then Give to Patient   RxID: [id number]  RANITIDINE HCL 150 MG CAPS (RANITIDINE HCL) one by mouth twice a day  #60 x 1   Entered and Authorized by: Gilman Schmidt MD   Signed by: Gilman Schmidt MD on 07/02/2008   Method used: Print then Give to Patient   RxID: [id number]      Assessment and Plan     Problems   Status of Existing Problems:  Assessed ANGIOEDEMA as deteriorated - has recurrent angioedema; will give an H1 and H2 blocker and medrol dose pack; she will see an allergist;  - Gilman Schmidt MD    Medications   New Prescriptions/Refills:  MEDROL (PAK) 4 MG TAB (METHYLPREDNISOLONE) use as directed  #1 x 0, 07/02/2008, Gilman Schmidt  MD  RANITIDINE HCL 150 MG CAPS (RANITIDINE HCL) one by mouth twice a day  #60 x 1, 07/02/2008, Gilman Schmidt MD    Today's Orders   [id number] - Ofc Vst, Est Level III [DPO-24235]  Allergy Consult [phone]

## 2008-11-17 ENCOUNTER — Inpatient Hospital Stay

## 2008-11-17 NOTE — Unmapped (Signed)
Signed by   LinkLogic on 11/17/2008 at 15:42:24  Patient: [name]  Note: All result statuses are Final unless otherwise noted.    Tests: (1) DIAG-KNEE 3-VIEWS LT [phone])    Order Note: [facility] Accession Number: [id number]    Order Note:                               [facility]     Patient: [name], [name]   DOB:     1946/04/18   MRN:     [id number]   FIN:       [id number]   Accn#:   [phone]                                  Diagnostic Radiology     Exam                       Exam Date/Time       Ordering Physician   DIAG-KNEE 3-VIEWS LT       11/17/2008 10:07 EDT Malikai Gut, Normand Sloop T     Reason for Exam   S/P LT TIBIAL PLATEAU FX,LT KNEE POST TRAUMATIC AR     Report   LEFT KNEE PERFORMED ON 11/17/2008:     COMPARISON: 03/17/2008, 05/07/2007     FINDINGS:     The plate and screws securing the fracture involving the lateral tibial   plateau have been removed by 03/17/2008.  There has been no change in the   status of the tibial plateau over this interval since 03/17/2008.  There is   marked irregularity of the tibial plateau with significant sclerosis   beneath it which is part of the healing process.  There is no evidence of   joint effusion.     IMPRESSION:     1. CHANGES INVOLVING PRIMARILY THE LATERAL TIBIAL PLATEAU AND TIBIA BELOW   IT SHOWING NO CHANGE SINCE 03/17/2008.     2. VERY MINIMAL OSTEOARTHROSIS INVOLVING THE MEDIAL COMPARTMENT.   ********** VERIFIED REPORT **********     Dictated:                         [id number] Jabier Gauss MD, Sharlyn Bologna   Signed (Electronic Signature):  [phone] Jabier Gauss MD, Kevin Fenton F        11/17/08   3:42   Transcribed by: JDB   Technologist: SAB    Order Note:   EMR Routing to: Eldred Manges - ordering - [id number]    Note: An exclamation mark (!) indicates a result that was not dispersed into   the flowsheet.  Document Creation Date: 11/17/2008 3:42 PM  _______________________________________________________________________    (1) Order result status: Final  Collection or observation  date-time: 11/17/2008 10:07:49  Requested date-time: 11/17/2008 09:54:00  Receipt date-time:   Reported date-time: 11/17/2008 15:42:28  Referring Physician:    Ordering Physician: Normand Sloop Ahsley Attwood (LETT)  Specimen Source: RAD&Rad Type  Source: QRS  Filler Order Number: [id number]  Lab site: [facility]

## 2009-02-11 NOTE — Unmapped (Signed)
Signed by Loralee Pacas on 02/11/2009 at 14:02:13    Clinical Lists Changes    Orders:  Added new Test order of X-ray, Spine, Lumbosacral, minimum 4 views (CPT-72110) - Signed

## 2009-02-12 NOTE — Unmapped (Signed)
Signed by Danae Chen on 02/20/2009 at 10:08:30    Clinical Lists Changes    Medications:  Added new medication of NEURONTIN 100 MG CAPS (GABAPENTIN) 1 to 2 tabs by mouth every hs - Signed  Rx of NEURONTIN 100 MG CAPS (GABAPENTIN) 1 to 2 tabs by mouth every hs;  #60 x 0;  Signed;  Entered by: Danae Chen;  Authorized by: Bonnita Nasuti MD;  Method used: Handwritten

## 2009-02-13 NOTE — Unmapped (Signed)
Signed by Loralee Pacas on 02/13/2009 at 15:52:50    Clinical Lists Changes    Problems:  Added new problem of LUMBAR RADICULOPATHY (ICD-724.4)  Orders:  Added new Test order of MRI, Spine, Lumbar w/o contrast (YNW-29562) - Signed  Added new Test order of EMG (ZHY-86578) - Signed

## 2009-02-13 NOTE — Unmapped (Signed)
Signed by Bonnita Nasuti MD on 02/13/2009 at 00:00:00  Prior Authorization      Imported By: Betsey Amen 04/03/2009 10:40:46    _____________________________________________________________________    External Attachment:    Please see [facility] EMR for this document.

## 2009-02-13 NOTE — Unmapped (Signed)
Signed by Loralee Pacas on 02/24/2009 at 13:24:59             [facility]         Orthopaedics & Sports Medicine                                              Location:  [facility]         [address]         [place], [place] [address]         p [phone] f [phone]         [email or web address]      February 13, 2009      PATIENT NAME: [name]  DATE OF BIRTH: January 27, 1946    Dear Lucita Lora.:    I had the pleasure of seeing your patient in the office today.  Please see my Assessment and Plan below.  My complete office note is available at your request.      ASSESSMENT  63 YO   TRAUMATIC INJURY TO THE L LEG AND APPARENT L LEG PERIPHERAL NERVE INJURY RELATED TO THIS         PLAN  EMG OF THE L LOWER EXTREMITY TO EVAL LOCAL NERVE INJURY AS DETAILED ABOVE ( this is something that Dr Conley Rolls could have ordered )  MRI of the LS SPINE TO [facility] PATH[facility]Y   TRIAL OF NEURONTIN  THIS PATIENT SHOULD LIKELY BE REFERRED BY DR LE FOR OTHER CHRONIC PAIN MANGEMENT MODALITIES  MAY BENEFIT FROM REFERAL TO A PERIPHERAL NERVE SPECIALIST     Thank you for allowing me to participate in the care of your patient. I will proceed with my plan of care for this patient???s condition.  Please feel free to contact me to discuss our patient???s care at any time.        Sincerely,      Bonnita Nasuti, MD  Associate Professor and Director  Division of Spine Surgery      CC:

## 2009-02-13 NOTE — Unmapped (Addendum)
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 I do not specialize in peripheral nerve injurie s and this was relayed to the patient   there is apparently some question about wether this could be related to a lumbar condition     seen '08  Lumbar spine , and Left butt and posterior thigh  Left leg. Also c/o Left knee pain and antrior lower leg, she has had her hardware in leg removed Aug '09 by Dr Conley Rolls.      HISTORY OF PRESENT ILLNESS   Reason for Visit: NP Lumbar spine and Left butt and Left posterior thigh  Chief Complaint: Back L Leg  Duration: 3-6 months  Onset: Gradual  Symptom Course: Worse  Back/ Leg Ratio: 90/10  90 in left butt   Back Pain Distribution: Mid, Left Butt  Leg Pain Distribution: Left Post Thigh, Leftt lower anterior leg  Numbness: None    Tingling: None  Weakness: Left, Limp, Stair Climb, Flat Surface, Arise form Chair, Downstairs  Bowel and Bladder: None  Treatments: None  Diagnostic Studies: None  Past Episodes: Both, Now Worse  Trauma: Yes    PAST HISTORY  Family History (reviewed - no changes required): denies fm hx of mm    Neurologic: Complains of abnormal walking, weakness, sensory changes.   Musculoskeletal: Complains of muscle aches.     Insurance Today: anthem  Nursing Home: No  Refills Needed today: no  Need Physical Therapy Order: No  Needs Work/School  Note: No    Medications on Intake   TRIAMCINOLONE ACETONIDE 0.1 % OINT (TRIAMCINOLONE ACETONIDE) apply as dir  * PROPRANOLOL 20MG  1-2 by mouth twice a day  PREMPRO   TABS (CONJ ESTROG-MEDROXYPROGEST ACE TABS) 1 by mouth daily.  CLARINEX 5 MG TABS (DESLORATADINE) 1 by mouth daily  RANITIDINE HCL 150 MG CAPS (RANITIDINE HCL) one by mouth twice a day     Allergies  ! IODINE  Allergy and adverse reaction list reviewed during this update.      VITAL SIGNS   Height: 65.5 in.    Weight: 97 lbs.   BMI (in-lb): 15.95   Respirations: 16   Pulse rate: 60     Blood pressure: 100/ 60 mmHg     Intake recorded by: Fabio Bering  February 13, 2009 2:48 PM    Smoking Status: non-smoker        Lumbar Spine Physical Examination:   Inspection: General inspection of the lumbar spine revealed no abnormalities.    Palpation: Palpation of the lumbar spine revealed no abnormalities.    Active Range of Motion Active range of motion is full throughout.    Provacative Maneuvers:   Straight Leg Raise Test: negative bilaterally    MUSCLE STRENGTH  (Basic Examination)   Lower Extremities  Right Hip Flexion: 5/5  Left Hip Flexion: 5/5  Right Hip Extension: 5/5  Left Hip Extension: 5/5  Right Hip Adduction: 5/5  Left Hip Adduction: 5/5  Right Hip Abduction: 5/5  Left Hip Abduction: 5/5  Right Knee Extension: 5/5  Left Knee Extension: 5/5  Right Knee Flexion: 5/5  Left Knee Flexion: 5/5  Right Ankle Dorsiflexion: 5/5  Left Ankle Dorsiflexion: 5/5  Right Great Toe Extension: 5/5  Left Great Toe Extension: 5/5  Right Ankle Inversion: 5/5  Left Ankle Inversion: 5/5  Right Ankle Eversion: 5/5  Left Ankle Eversion: 5/5  Right Ankle Plantar Flexion: 5/5  Left Ankle Plantar Flexion: 5/5    SENSORY EXAMINATION   RIGHT   Lateral Thigh (L2-3): Intact  Medial Thigh (L2-3): Intact  Anterior Thigh (L3-4): Intact  Medial Leg (L4): Intact  Lateral Leg (L5): Intact  Dorsal Foot (L5): Intact  B/W 1st-2nd Toe (L5): Intact  Posterior Leg (S1): Intact  Lateral Heel  (S1): Intact  Plantar Foot (S1): Intact  Posterior Thigh (S2): Intact    LEFT   Lateral Thigh (L2-3): Intact  Medial Thigh (L2-3): Intact  Anterior Thigh (L3-4): Intact  Medial Leg (L4): Intact  Lateral Leg (L5): Decreased  Dorsal Foot (L5): Decreased  Posterior Leg (S1): Intact  Lateral Heel (S1): Intact  Plantar Foot (S1): Intact  Posterior Thigh (S2): Intact      PHYSICAL EXAMINATION   General Appearance:   White, female, well developed, well nourished, very thin build .  Gait:   Normal gait pattern, able to walk on toes, lt antalgic.  Balance:   Normal sitting balance, noraml standing balance, normal romberg.  Posture:   No postural abnormalities.  HEENT:   No impaired hearing, no obvious ocular abnormality.  Neck:   Supple, no adenopathy.  Abdomen:   Soft, non-tender, non-distended, no masses, bowel sounds present, no rebound tenderness, no guarding, no hepatosplenomegaly.  Lymphatic:   No obvious peripheral lymphadenopathy.  Skin:   No erythema, no breakdown, no concerning lesions.  Psychiatric:   Oriented to time; place; and person, affect and mood appropriate, normal interaction, good eye contact, normal cognition, cooperative with exam.        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)  THIS DOES NOT APPEAR TO BE PRESENT BASED ON PHYSICAL EXAM AS SYMPTOMS ARE CENTERD ABOUT THE SITE OF INJURY IE L KNEE    ASSESSMENT   63 YO   TRAUMATIC INJURY TO THE L LEG AND APPARENT L LEG PERIPHERAL NERVE INJURY RELATED TO THIS     PLAN   EMG OF THE L LOWER EXTREMITY TO EVAL LOCAL NERVE INJURY AS DETAILED ABOVE ( this is something that Dr Conley Rolls could  have ordered )  MRI of the LS SPINE TO RO [facility] PATHOLOGY   TRIAL OF NEURONTIN  THIS PATIENT SHOULD LIKELY BE REFERRED BY DR LE FOR OTHER CHRONIC PAIN MANGEMENT MODALITIES  MAY BENEFIT FROM REFERAL TO A PERIPHERAL NERVE SPECIALIST     DISPOSITION   Return to clinic for MD Visit visit after testing  CC:   Lucita Lora.    MEDICAL DECISION MAKING   * review/order radiology tests  * review/order other diagnostic or tx interventions  * discussion of test results with performing MD  * obtain old records or history from another person  * review and summarization of old records  * independent review of data- e.g. image- specimen  * Permanent chart problem/surgery list reviewed  * Permanent chart chronic med/ allergy list reviewed  * Permanent chart social/family history reviewed  * PFSH and Health Screening (pt reported) reviewed  * reviewed films    Patient Education:   * verbal information and/or pamphlet/ literature given to patient.    Risks & Benefits:   Risks, benefits and treatment options discussed with patient/ guarantor            ]  Signed by Wylie Hail on 03/05/2009 at 15:51:11          Clinical Lists Changes    Orders:  Added new [place] order of 70350 - Ofc Consult, Level IV ([id number]) - Signed

## 2009-02-13 NOTE — Unmapped (Signed)
Signed by Bonnita Nasuti MD on 02/13/2009 at 00:00:00  [facility]      Imported By: Coletta Memos 02/23/2009 15:29:51    _____________________________________________________________________    External Attachment:    Please see [facility] EMR for this document.

## 2009-02-13 NOTE — Unmapped (Signed)
Signed by Cherlynn Polo on 02/13/2009 at 17:08:12    Clinical Lists Changes    Orders:  Added new Service order of Lumbar, AP/Lat, flex and exten (CPT-72110) - Signed

## 2009-02-13 NOTE — Unmapped (Signed)
Signed by Cherlynn Polo on 02/17/2009 at 08:55:32    [facility] AND [facility]TS MEDICINE       X-Ray Report    Xrays Lumbar spine ap and lateral, flex and ext views    VERY MILD DECREASE IN DISC HGT L45 LEVEL              Sincerely,      Bonnita Nasuti, MD  Associate Professor and Director  Division of Spine Surgery

## 2009-02-18 ENCOUNTER — Inpatient Hospital Stay

## 2009-02-24 NOTE — Unmapped (Signed)
Signed by Fabio Bering on 02/24/2009 at 18:33:14      Preload Clinical Lists   Problems:   FRACTURE, LEG, LEFT (ICD-827.0)  LATE EFF INJURY PERIPH NERVE PELV GIRDL&LOW LIMB (ICD-907.5)  DEGENERATIVE DISC DISEASE, LUMBAR SPINE (ICD-722.52)  LUMBAR RADICULOPATHY (ICD-724.4)  ANGIOEDEMA (ICD-995.1)  OSTEOPOROSIS (ICD-733.00)  HEALTH MAINTENANCE EXAM (ICD-V70.0)  DERMATITIS, CONTACT, NOS (ICD-692.9)  SKIN RASH (ICD-782.1)  VERTIGO (ICD-780.4)  SCIATICA (ICD-724.3)  FACIAL RASH (ICD-782.1)    Medications:   TRIAMCINOLONE ACETONIDE 0.1 % OINT (TRIAMCINOLONE ACETONIDE) apply as dir  * PROPRANOLOL 20MG  1-2 by mouth twice a day  PREMPRO   TABS (CONJ ESTROG-MEDROXYPROGEST ACE TABS) 1 by mouth daily.  CLARINEX 5 MG TABS (DESLORATADINE) 1 by mouth daily  RANITIDINE HCL 150 MG CAPS (RANITIDINE HCL) one by mouth twice a day  NEURONTIN 100 MG CAPS (GABAPENTIN) 1 to 2 tabs by mouth every hs      Allergies:  ! IODINE  Past Medical History:  No significant past medical history.              Coordinating Care Providers   Primary Care Provider: Wynetta Emery.D.

## 2009-03-16 NOTE — Unmapped (Signed)
Signed by   LinkLogic on 03/18/2009 at 08:58:26  Patient: BRENLEE KOSKELA  Note: All result statuses are Final unless otherwise noted.    Tests: (1) MAMM-SCREENING DIRECT DIGITAL [id number])    Order Note:     Order Note:   SCREENING MAMMOGRAM:  03/16/09    CLINICAL INDICATION:  Screening, no current complaint.    COMPARISON:  Films dating to 08/07/04.    Full field digital mammography was performed of both breasts. The study is  reviewed with the R2 image checker.    Bilateral Breast Findings:    The breasts are extremely dense, which lowers the sensitivity of  mammography.  There are no significant masses, calcifications or other abnormalities.    IMPRESSION:      Negative, no evidence of malignancy. Routine annual mammography is  recommended.    /cg          Order Note: Non-EMR Ordering Provider: Glyn Ade    Order Note:     Order Note:     Order Note:     This document is confidential medical information.  Unauthorized disclosure or   use of this information is prohibited by law.  If you are not the intended   recipient of this document, please advise [place] by calling immediately   [phone].   MAMM-SCREENING DIRECT DIGITAL                              See Report (R)    Note: An exclamation mark (!) indicates a result that was not dispersed into   the flowsheet.  Document Creation Date: 03/18/2009 8:58 AM  _______________________________________________________________________    (1) Order result status: Final  Collection or observation date-time: 03/16/2009  Requested date-time: 03/16/2009  Receipt date-time:   Reported date-time:   Referring Physician:    Ordering Physician:  Non-EMR Physician [facility])  Specimen Source:   Source: Damaris Hippo Order Number: [id number] RADIOLOGY  Lab site:

## 2009-03-16 NOTE — Unmapped (Signed)
Signed by Alver Fisher MD on 03/16/2009 at 00:00:00  Mammography      Imported By: Marlane Hatcher 03/31/2009 11:16:54    _____________________________________________________________________    External Attachment:    Please see [facility] EMR for this document.

## 2009-09-24 NOTE — Unmapped (Signed)
Signed by Alver Fisher MD on 09/25/2009 at 11:48:26      Reason for Visit   Chief Complaint: Pt c/o dry mouth for 3 weeks.    History from: patient    Allergies  ! IODINE  Allergy and adverse reaction list reviewed during this update.      Medications   TRIAMCINOLONE ACETONIDE 0.1 % OINT (TRIAMCINOLONE ACETONIDE) apply as dir  * PROPRANOLOL 20MG  1-2 by mouth twice a day  PREMPRO   TABS (CONJ ESTROG-MEDROXYPROGEST ACE TABS) 1 by mouth daily.  CLARINEX 5 MG TABS (DESLORATADINE) 1 by mouth daily  RANITIDINE HCL 150 MG CAPS (RANITIDINE HCL) one by mouth twice a day  NEURONTIN 100 MG CAPS (GABAPENTIN) 1 to 2 tabs by mouth every hs       Vital Signs:   Wt: 101 lbs.      BMI: 16.61  BSA: 1.49  Wt chg (lbs): 4  Pulse: 68 (regular)  BP: 116/58  Barriers to Care/Communication: None    Intake recorded by: Curly Rim on September 24, 2009 3:51 PM    History of Present Illness   Here for dry mouth. Tongue is a bit sore and irritated. For about 3 weeks. No other symptoms. Minor eye problems but not dry. Denies sinus issues or mouth breathing.           Physical Examination:   BP: 116/  58    Physical Exam- Detail:   General Appearance: Very thin, appears well.   Oropharynx: Mouth looks pretty normal, some moisture evident. Tongue slightly red but otherwise OK.   Neck: Nodes and glands all feel normal.            New Problems:  DRY MOUTH (ICD-527.7)      Preventive Maintenance       Coordinating Care Providers   PCP Name: Lucita Lora.              Assessment and Plan   Dry mouth - reviewed the DDx with her and recommended at least basic testing for diabetes and such. If negative, then observe and see if resolves on its own.     Problems New Problems:  Dx of DRY MOUTH (ICD-527.7)   Today's Orders   99213 - Ofc Vst, Est Level III [CPT-99213]  CMP   (METAPNL) (10231) [CPT-80053]  TSH + FT4 reflex    (TSHRFX) ([address])  [UEA-54098]  [facility] Sjorgen's panel (11914) [CPT-86038/86235]                  Process Orders  Check Orders  Results:      EMR Link Lab: ABN not required for this insurance  Tests Sent for requisitioning (September 25, 2009 11:46 AM):      09/24/2009: EMR Link Lab -- CMP   (METAPNL) (10231) [CPT-80053] (signed)      09/24/2009: EMR Link Lab -- TSH + FT4 reflex    (TSHRFX) (78295)  [AOZ-30865] (signed)      09/24/2009: EMR Link Lab -- [facility] Sjorgen's panel (78469) [CPT-86038/86235] (signed)    ]

## 2009-11-18 NOTE — Unmapped (Signed)
Signed by Kerrin Mo MA on 11/18/2009 at 10:39:21                   [facility]         [address]         [place], [place] [address]         p [phone] f [phone]                     November 18, 2009        [name]  [address]   [place], [place] [address]      This notice is to remind you that you are past due for routine labs that were ordered by your doctor.  Please contact the office at [phone] when you are ready to come in for this and we'll make sure the orders are at the front desk waiting for you.      The lab is open from 7:30 AM until 5:30 PM.  Please call if you have any questions about this.      Sincerely,        Knute Neu. Emilie Rutter, MD

## 2009-12-16 NOTE — Unmapped (Signed)
Signed by Skipper Cliche MD on 12/16/2009 at 00:00:00  [facility]      Imported By: Coletta Memos 12/28/2009 11:21:08    _____________________________________________________________________    External Attachment:    Please see [facility] EMR for this document.

## 2009-12-18 NOTE — Unmapped (Signed)
Signed by Rachelle Hora on 12/18/2009 at 12:08:33      Today's Orders   X-ray, Knee, 2 views [[id number]]

## 2009-12-21 ENCOUNTER — Inpatient Hospital Stay

## 2009-12-21 NOTE — Unmapped (Signed)
Signed by   LinkLogic on 12/21/2009 at 13:34:15  Patient: [name]  Note: All result statuses are Final unless otherwise noted.    Tests: (1) DIAG-KNEE 1 OR 2-VIEWS LT ([id number])    Order Note: [facility] Accession Number: [id number]    Order Note:                               [facility]     Patient: [name], [name]   DOB:     [birth year]   MRN:     [id number]   FIN:       [id number]   Accn#:   [id number]                                  Diagnostic Radiology     Exam                       Exam Date/Time       Ordering Physician   DIAG-KNEE 1 OR 2-VIEWS LT  12/21/2009 12:00 EDT Randol Zumstein MD, [name] Sloop T     Reason for Exam   LT TIBIAL PLAT. FX     Report   Left knee, 3 views dated 21 December 2009 @ 11: 51 hours.     Comparison: 17 November 2008.     Impression:     1. Old depressed fracture of lateral tibial plateau, healed with deformity,   unchanged in position.   2. Tricompartmental arthrosis, most severe in the lateral compartment, with   joint effusion.   ********** VERIFIED REPORT **********     Dictated: 12/21/2009 1:34 pm      CANTOR MD, Gerrit Friends   Signed (Electronic Signature):  Hadassah Pais MD, Gerrit Friends             12/21/09   1:36     Technologist: Sandria Bales    Order Note:   EMR Routing to: Eldred Manges - ordering - [id number]    Note: An exclamation mark (!) indicates a result that was not dispersed into   the flowsheet.  Document Creation Date: 12/21/2009 1:34 PM  _______________________________________________________________________    (1) Order result status: Final  Collection or observation date-time: 12/21/2009 12:00:08  Requested date-time: 12/21/2009 14:21:00  Receipt date-time:   Reported date-time: 12/21/2009 13:36:29  Referring Physician:    Ordering Physician: [name] ([id number])  Specimen Source: RAD&Rad Type  Source: QRS  Filler Order Number: [id number]  Lab site: [facility]

## 2009-12-21 NOTE — Unmapped (Addendum)
Signed by Skipper Cliche MD on 12/24/2009 at 16:36:32  Patient: [name]  Note: All result statuses are Final unless otherwise noted.    Tests: (1)  (MR)    Order Note:                [facility] systems [facility]     PATIENT NAME:   [name], [name].                  IDX #:  [id number]  DATE OF BIRTH:  1945/10/07                        LW MRN:  [id number]  DICTATED BY:    Skipper Cliche, M.D.            VISIT DATE:  12/21/2009                                          OFFICE NOTE     *-*-*  REASON FOR VISIT:  Ms. Flewellen is here for followup regarding her left tibial  plateau.  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  Her last injection was in June of last year, and she has had good relief  until the beginning of this year.  She is still active; however, there are  more episodes of increasing pain.     PHYSICAL EXAMINATION:  On examination today she has full range of motion.  There is mild effusion of the left knee.  Stable to varus-valgus.  The quad  is still weak and atrophied compared to the right side.     IMAGING:  Three-view x-ray of the left knee shows increased osteophytes  medially and also incongruency of the lateral compartment.     ASSESSMENT:  Left knee osteoarthritis.     PLAN:  I will continue with the injection to help with her symptoms until the  point where this is not effective, then will proceed with left total knee.  Under sterile conditions her left knee was injected, and she tolerated the  injection well.  We will see her back on an as needed basis.  *-*-*                                              _______________________________________  TTL/jlq                                _____  D:  12/21/2009 T:  12/23/2009 22:23    Skipper Cliche, M.D.  Job #:  [id number]                          OFFICE NOTE                    PAGE  1  of   1    Note: An exclamation mark (!) indicates a result that was not dispersed into   the flowsheet.  Document Creation  Date: 12/23/2009 10:29 PM  _______________________________________________________________________    (1) Order result status: Final  Collection or observation date-time: 12/21/2009 00:00  Requested date-time:  Receipt date-time:   Reported date-time:   Referring Physician:    Ordering Physician:   ([id number])  Specimen Source:   Source: DBU  Filler Order Number: [id number]  Lab site:   Signed by Rachelle Hora on 02/25/2010 at 15:30:27    FAXED TO [facility] 02/25/10

## 2009-12-21 NOTE — Unmapped (Signed)
Signed by Skipper Cliche MD on 12/21/2009 at 13:11:44    [facility]              Medications on Intake   TRIAMCINOLONE ACETONIDE 0.1 % OINT (TRIAMCINOLONE ACETONIDE) apply as dir  * PROPRANOLOL 20MG  1-2 by mouth twice a day  RANITIDINE HCL 150 MG CAPS (RANITIDINE HCL) one by mouth twice a day     Allergies  ! IODINE  Allergy and adverse reaction list reviewed during this update.      VITAL SIGNS   Height: 65.5 in.   Respirations: 18   Pulse rate: 80     Intake recorded by: Silva Bandy Ward MA  December 21, 2009 12:10 PM                 Medications reviewed, updated and verified with patient or patient representative.  Women (Any Age) or Men (over Age 20): nondrinker                    See transcribed note for additional clinical documentation for this visit.    DISPOSITION   prn     CC:   Wynetta Emery.D.

## 2009-12-23 NOTE — Unmapped (Signed)
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 IODINE  PAST HISTORY  Past Medical History:  Osteoarthritis, Osteoporosis, Low Back Pain  Surgical History:  Cesarean Section: 1985, Dec 4 '07  Fractured  Left tibia plateau, Aug '09 removal of hardware of Left  tibia plateau    Family History: denies fm hx of mm  Mother - stroke  Social History: Alcohol Use: daily  Drug Use: none  Tobacco Usage:non-smoker              Coordinating Care Providers   Primary Care Provider: Lucita Lora.  Physical Therapy: [facility]        ]

## 2009-12-30 LAB — OFFICE VISIT LAB RESULTS
A/G Ratio: 1.8
A/G Ratio: 1.8
ALT: 13 U/L
ALT: 13 U/L
AST: 24 U/L
AST: 24 U/L
Albumin: 4.6 g/dL
Albumin: 4.6 g/dL
Alkaline Phosphatase: 80 U/L
BUN: 18 mg/dL
BUN: 18 mg/dL
CO2: 27 mmol/L
CO2: 27 mmol/L
Calcium: 9.8 mg/dL
Calcium: 9.8 mg/dL
Chloride: 100 mmol/L
Chloride: 100 mmol/L
Chol/HDL Ratio: 3.2
Chol/HDL Ratio: 3.2
Chol/HDL Ratio: 3.2
Cholesterol, Total: 192 mg/dL
Cholesterol, Total: 192 mg/dL
Cholesterol, Total: 192 mg/dL
Creatinine: 1.02 mg/dL
Creatinine: 1.02 mg/dL
Globulin, Total: 2.5 g/dL
Globulin, Total: 2.5 g/dL
Glucose: 77 mg/dL
Glucose: 77 mg/dL
HDL: 60 mg/dL
HDL: 60 mg/dL
HDL: 60 mg/dL
LDL Cholesterol: 106 mg/dL
LDL Cholesterol: 106 mg/dL
LDL Cholesterol: 106 mg/dL
Potassium: 4.1 mmol/L
Potassium: 4.1 mmol/L
Sodium: 137 mmol/L
Sodium: 137 mmol/L
TSH: 5.47 u[IU]/mL
TSH: 5.47 u[IU]/mL
TSH: 5.47 u[IU]/mL
Total Bilirubin: 0.7 mg/dL
Total Bilirubin: 0.7 mg/dL
Total Protein: 7.1 g/dL
Total Protein: 7.1 g/dL
Triglycerides: 131 mg/dL
Triglycerides: 131 mg/dL
Triglycerides: 131 mg/dL
Vit D, 25-Hydroxy: 69 ng/mL
Vitamin D2, 1,25 (~~LOC~~)2: <4 ng/mL
Vitamin D3, 1,25 (~~LOC~~)2: 69 ng/mL

## 2009-12-30 NOTE — Unmapped (Signed)
Signed by Esmond Plants RMA on 12/30/2009 at 15:37:53      Metabolic Panel   Date:   12/16/2009  Glucose:   77  BUN:   18  Creatinine:   1.02  Sodium:   137  Potassium:   4.1  Chloride:   100  Carbon Dioxide:   27  Calcium:   9.8  Total Protein:   7.1  Albumin:   4.6  Globulin (calc):   2.5  Albumin/Globulin Ratio:   1.8  Total Bilirubin:   0.7  Alkaline Phosphatase:   80  AST:   24  ALT:   13    Recorded by: Esmond Plants RMA on December 30, 2009 11:36 AM    Lipid Panel   Date:   12/16/2009  Performing Lab:   [facility]  Triglycerides:   131  Cholesterol, Total: 192  HDL:   60  LDL:   106  Cholesterol/HDL Ratio: 3.2    Recorded by: Esmond Plants RMA on December 30, 2009 11:34 AM    TSH   Date:   12/16/2009  TSH:   5.47    Recorded by: Esmond Plants RMA on December 30, 2009 11:36 AM      Preventive Maintenance       Coordinating Care Providers   PCP Name: Lucita Lora.    Clinical Lists Changes    Observations:  Added new observation of CHOL/HDL: 3.2  (12/16/2009 15:37)  Added new observation of LDL: 106 mg/dL (14/78/2956 21:30)  Added new observation of HDL: 60 mg/dL (86/57/8469 62:95)  Added new observation of CHOLESTEROL: 192 mg/dL (28/41/3244 01:02)  Added new observation of TRIGLYCERIDE: 131 mg/dL (72/53/6644 03:47)  Added new observation of TSH: 5.47 microintl units/mL (12/16/2009 15:37)  Added new observation of SGPT (ALT): 13 units/L (12/16/2009 15:37)  Added new observation of SGOT (AST): 24 units/L (12/16/2009 15:37)  Added new observation of BILI TOTAL: 0.7 mg/dL (42/59/5638 75:64)  Added new observation of A/G RATIO: 1.8  (12/16/2009 15:37)  Added new observation of GLOBULIN TOT: 2.5 g/dL (33/29/5188 41:66)  Added new observation of ALBUMIN: 4.6 g/dL (12/04/1599 09:32)  Added new observation of PROTEIN, TOT: 7.1 g/dL (35/57/3220 25:42)  Added new observation of CALCIUM: 9.8 mg/dL (70/62/3762 83:15)  Added new observation of CO2 TOTAL: 27 mmol/L (12/16/2009 15:37)  Added new observation of CHLORIDE: 100 mmol/L  (12/16/2009 15:37)  Added new observation of POTASSIUM: 4.1 mmol/L (12/16/2009 15:37)  Added new observation of CREATININE: 1.02 mg/dL (17/61/6073 71:06)  Added new observation of BUN: 18 mg/dL (26/94/8546 27:03)  Added new observation of GLUCOSE SER: 77 mg/dL (50/02/3817 29:93)  Added new observation of SODIUM: 137 mmol/L (12/16/2009 15:37)  Added new observation of TSH: 5.47 microintl units/mL (12/16/2009 11:36)  Added new observation of CHOL/HDL: 3.2  (12/16/2009 11:36)  Added new observation of LDL: 106 mg/dL (71/69/6789 38:10)  Added new observation of HDL: 60 mg/dL (17/51/0258 52:77)  Added new observation of CHOLESTEROL: 192 mg/dL (82/42/3536 14:43)  Added new observation of TRIGLYCERIDE: 131 mg/dL (15/40/0867 61:95)  Added new observation of TSH: 5.47 microintl units/mL (12/16/2009 11:36)  Added new observation of SGPT (ALT): 13 units/L (12/16/2009 11:36)  Added new observation of SGOT (AST): 24 units/L (12/16/2009 11:36)  Added new observation of ALK PHOS: 80 units/L (12/16/2009 11:36)  Added new observation of BILI TOTAL: 0.7 mg/dL (09/32/6712 45:80)  Added new observation of A/G RATIO: 1.8  (12/16/2009 11:36)  Added new observation of GLOBULIN TOT: 2.5 g/dL (99/83/3825 05:39)  Added new observation of ALBUMIN: 4.6 g/dL (13/01/6577 46:96)  Added new observation of PROTEIN, TOT: 7.1 g/dL (29/52/8413 24:40)  Added new observation of CALCIUM: 9.8 mg/dL (04/02/2535 64:40)  Added new observation of CO2 TOTAL: 27 mmol/L (12/16/2009 11:36)  Added new observation of CHLORIDE: 100 mmol/L (12/16/2009 11:36)  Added new observation of POTASSIUM: 4.1 mmol/L (12/16/2009 11:36)  Added new observation of CREATININE: 1.02 mg/dL (34/74/2595 63:87)  Added new observation of BUN: 18 mg/dL (56/43/3295 18:84)  Added new observation of GLUCOSE SER: 77 mg/dL (16/60/6301 60:10)  Added new observation of SODIUM: 137 mmol/L (12/16/2009 11:36)  Added new observation of CHOL/HDL: 3.2  (12/16/2009 11:34)  Added new observation of LDL:  106 mg/dL (93/23/5573 22:02)  Added new observation of HDL: 60 mg/dL (54/27/0623 76:28)  Added new observation of CHOLESTEROL: 192 mg/dL (31/51/7616 07:37)  Added new observation of TRIGLYCERIDE: 131 mg/dL (10/62/6948 54:62)  Added new observation of VIT D 25-[place]: 69 ng/mL (12/16/2009 11:33)  Added new observation of 25HD3: 69 ng/mL (12/16/2009 11:33)  Added new observation of 25HD2: <4 ng/mL (12/16/2009 11:33)

## 2009-12-30 NOTE — Unmapped (Signed)
Signed by Alver Fisher MD on 12/30/2009 at 16:22:51    PHONE NOTE  Call back at Home Phone: [phone]    Reason for Call: calling re her abnormal thyroid      Initial call taken by: Stann Mainland,  December 30, 2009 10:31 AM      FOLLOW UP  called pt to get more info , pt seen Gyn  DR Job Founds  [phone] , he ordered some labs on pt , pt thinks some are the same as Dr Emilie Rutter ordered ,she though there office would forward info to [place] , nothing in emr chart , called there office to have labs faxed to [place]   Follow-up by:  Esmond Plants RMA,  December 30, 2009 10:38 AM    FOLLOW UP  labs faxed , put into emr   Follow-up by:  Esmond Plants RMA,  December 30, 2009 11:33 AM    FOLLOW UP  Patient called. Mild increase - I don't think needs Rx or retesting. Recheck in not longer than one year.   Follow-up by:  Alver Fisher MD,  December 30, 2009 4:22 PM

## 2010-01-07 NOTE — Unmapped (Signed)
Signed by Alver Fisher MD on 01/07/2010 at 16:26:45    PHONE NOTE  Caller's Cell Phone #: [phone]  Call for: [facility]    Reason for Call: Pt requesting referral to GI, possibly tomorrow.    Pt c/o heavy rectal bleeding and diarrhea -with bright red bleeding. Pt had pelvic and rectal exam by gyn and was told most probably internal hemmoroid.        Initial call taken by: Tilford Pillar,  January 07, 2010 11:36 AM      FOLLOW UP  Patient called. Had some diarrhea Sunday but very little since. Last night about 5 PM, developed BRB from rectum. About a tablespoon each time. Would happen every couple of hours overnite. Slowing down some today. No pain or other symptoms. Saw Gyn and thought hemorrhoid. Sounds to me like hemorrhoid also rather than diverticular bleed. Recommended anusol supp and observe. Don't think we need to consider emergent GI or endoscopy.   Follow-up by:  Alver Fisher MD,  January 07, 2010 4:26 PM

## 2010-02-25 NOTE — Unmapped (Signed)
Signed by Skipper Cliche MD on 02/25/2010 at 00:00:00  BWC-C9 Form-Retro      Imported By: Coletta Memos 03/22/2010 22:20:51    _____________________________________________________________________    External Attachment:    Please see [facility] EMR for this document.

## 2010-02-25 NOTE — Unmapped (Signed)
Signed by Rachelle Hora on 02/25/2010 at 15:28:57      Orthopaedic [facility] Cases:   Case 1: Claim #: [id number]  DOI: 05/03/06  Allowed Diagnosis: 823.00, 716.16  MCO/TPA: [facility]  Body Part: KNEE  Left/Right: LEFT  Physician of Record:

## 2010-02-26 NOTE — Unmapped (Signed)
Signed by Skipper Cliche MD on 02/26/2010 at 00:00:00  BWC-C9 Form-Retro      Imported By: Coletta Memos 03/22/2010 22:21:02    _____________________________________________________________________    External Attachment:    Please see [facility] EMR for this document.

## 2011-01-14 ENCOUNTER — Ambulatory Visit: Admit: 2011-01-14 | Discharge: 2011-01-14 | Payer: PRIVATE HEALTH INSURANCE

## 2011-01-14 DIAGNOSIS — M5416 Radiculopathy, lumbar region: Secondary | ICD-10-CM

## 2011-01-14 NOTE — Unmapped (Signed)
History  The patient is here for (1) check a mole on back and (2) sciatica down left leg for at least two weeks. Classic L5 distribution. No neuro symptoms.     Exam  Appears well. The mole is a 1 cm seborrheic keratosis. Benign.   Negative straight leg raising. Normal strength both legs but has decreased ROM of left ankle presumably due to previous injury. Normal sensation though some subjective numbness 2nd and 3rd toes. Absent reflexes both knees and both ankles.     Assessment and Plan  SK - benign and reassured.   L5 radiculopathy on the left. Advised that not much we can do but will get better most of the time - typically over about 3 months. ADL's OK. Ibuprofen or aleve as needed.       Sindy Messing, M.D.

## 2011-02-10 NOTE — Unmapped (Signed)
Called,  Please send physical and ekg to number listed after pt has scheduled appt with DR. Wones on the 9/14.

## 2011-02-10 NOTE — Unmapped (Signed)
Fax [phone], pls send H&P, EKG

## 2011-02-18 ENCOUNTER — Ambulatory Visit: Admit: 2011-02-18 | Discharge: 2011-02-18 | Payer: PRIVATE HEALTH INSURANCE

## 2011-02-18 DIAGNOSIS — Z01818 Encounter for other preprocedural examination: Secondary | ICD-10-CM

## 2011-02-18 NOTE — Unmapped (Signed)
History  The patient is a 65 year-old woman here for a pre-operative examination for a D&C. Thickened stripe and some minimal bleeding. Was unable to do office biopsy.     Diagnosis   ??? Osteoarthritis   ??? Osteoporosis     Past Surgical History   Procedure Date   ??? Cesarean section 1985   ??? Leg surgery 05/09/06     fractured left tibia plateau   ??? Hardware removal 8/09     of left tibia plateau     Medication Sig   ??? estrogen, conjugated,-medroxyprogesterone (PREMPRO) 0.625-2.5 mg per tablet Take 1 tablet by mouth daily.  Stopped for the past several weeks.    ??? propranolol (INDERAL) 20 MG tablet Take 20 mg by mouth 2 (two) times daily. 1-2 by mouth - only as needed for performances.    ??? triamcinolone (KENALOG) 0.1 % ointment Apply topically. Apply as dir      Allergies   Allergen Reactions   ??? Iodine-Iodine Containing      Respiratory Distress   ??? Shellfish      Focused Review of Systems  Chest: No shortness-of-breath or cough.  Heart: Exercise tolerance normal. No chest pain or pressure.     Exam  General: The patient appears well. Thin.  Skin: Has minor benign lesions that all appear benign. No rashes.   Neck: Neck and supraclavicular nodes nontender and not enlarged. Thyroid is not enlarged. There were no carotid bruits.   Chest: Easy breathing. Clear in all lung fields.  Heart: Regular rhythm. Normal S1 and S2 without murmurs or gallops.    Abdomen: Bowel sounds present and normal. Liver and spleen not palpable (normal). No masses. Nontender and benign.     ECG:  NSR> Has normal variant RSR patterns in several leads. Normal ECG.     Assessment and Recommendations  The patient appears medically fit for the planned surgery. No special pre-operative testing or consultations are needed. She is on no regular medications and need not take any the day of surgery. In particular, she does not need to take her beta blocker.     Sindy Messing, M.D.

## 2011-02-24 NOTE — Unmapped (Signed)
Signed by   LinkLogic on 02/24/2011 at 13:42:52  Patient: [name]  Note: All result statuses are Final unless otherwise noted.    Tests: (1)  (OP Note)    Order Note: [facility]  PATIENT NAME:  [name], [name]     MR #:  [id number]  DATE OF BIRTH: 03-17-1946     ACCOUNT #:  [id number]  ADMITTING:     Satira Sark ERIC F.     ROOM #:  8 SDS  ATTENDING:     Satira Sark ERIC F.     NURSING UNIT: C8N  SERVICE:  SUR  ADMIT DATE:  02/23/2011  PRIMARY:  Alver Fisher     DISCHARGE DATE:  02/23/2011  REFERRING:     Satira Sark ERIC F.  DICTATED BY:   ERIC F STAMLER  DATE OF OPERATION: 02/23/2011    This report contains protected health information protected by law. If   received  in error please call [phone].    OPERATIVE REPORT  PREOPERATIVE DIAGNOSES:  1.  Postmenopausal bleeding.  2.  Thickened endometrium.    PREOPERATIVE DIAGNOSES:  1.  Postmenopausal bleeding.  2.  Thickened endometrium.    PROCEDURES PERFORMED:  1.  Hysteroscopy.  2.  Uterine dilation and curettage.    SURGEON: Freddrick March M.D.    ANESTHESIA: General by laryngeal mask.    ESTIMATED BLOOD LOSS: Nil.    COMPLICATIONS: None.    SPECIMENS: Small endometrial curettings sent to pathology.    INDICATIONS FOR PROCEDURE: Anyely is a very pleasant 65 year old white female   who  presented to the office as a new patient with postmenopausal bleeding.  She   had  been given estrogen replacement therapy by another physician for many years. A  pelvic exam in the office revealed cervical stenosis and therefore attempt at  endometrial biopsy in the office was unsuccessful. Pelvic ultrasound was  performed revealing a thickened endometrium and therefore, it was felt  appropriate to proceed with formal evaluation of the endometrium for   pathologic  reasons. She is was instructed to stop her estrogen replacement therapy   dilation  and curettage was scheduled. I gave the patient 3 doses of Cytotec 200 mcg   last  evening and this morning to assist with softening  of the cervix for dilation.  Risks, benefits, complications of D and C were discussed with the patient and  she agreed to proceed as planned.    DETAILS OF PROCEDURE: The patient was brought to the operating room and  underwent adequate level of general via laryngeal mask anesthesia in the   supine  position without complication. She was placed in the dorsal lithotomy position  and pelvic exam was performed. This revealed a small freely mobile, anteverted  uterus with normal adnexa bilaterally. The vagina and perineum were prepped   with  Hibiclens because of iodine allergy and then irrigated with warm normal   saline.  The patient was draped in the usual sterile fashion. A timeout was taken by   the  entire operating room staff confirming the nature of the procedure and all   were  in agreement to proceed as planned. Using Sims retractors for exposure, the  anterior lip of the cervix was grasped with a single-tooth tenaculum. The   cervix  was gently dilated with Hanks dilators and noted to be stenotic. A very   minimal  dilation was undertaken to avoid perforation of the uterus. Hysteroscope was  inserted revealing a  very small endocervical possible polyp but normal  endometrium thereafter. The hysteroscope was removed  and gentle sharp   curettage  obtained a small amount of tissue, which was sent to pathology. The tenaculum  was then removed and the patient taken out of the dorsal lithotomy position.   She  was awakened and taken to recovery room in satisfactory condition. Because the  endometrium appeared atrophic and the patient is now off estrogen replacement  therapy, most likely no further treatment will be necessary pending pathology.              Dict: *ERIC  F.  STAMLER. MD  Auth:  efs/cg/cg  D: 02/23/2011 15:58:56  T:  02/23/2011 16:43:58  Orig. Job #: [id number]  iChart Job #:  [id number]    c: *ERIC  Salina April. MD, <Dictator>  *Alver Fisher MD, <Primary Care>      Note: An exclamation mark (!)  indicates a result that was not dispersed into   the flowsheet.  Document Creation Date: 02/24/2011 1:42 PM  _______________________________________________________________________    (1) Order result status: Final  Collection or observation date-time: 02/24/2011  Requested date-time: 02/23/2011 00:00  Receipt date-time:   Reported date-time:   Referring Physician: ERIC Satira Sark  Ordering Physician:  Reviewed [facility])  Specimen Source:   Source: Francesco Sor Order Number: TRANSCRIPTION  Lab site:

## 2012-03-21 NOTE — Unmapped (Addendum)
Pt has a Plantar's wart on her Thumb , it is very small, area is hard.  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  she would like a ref to get this removed ,  and would like to know who you recommend.     Tried to call patient on 03/21/2012 at 5:10 PM. Not home. Message left that we tried. Since uses finger to play violin for her living, need to discuss derm destructive approach versus plastic surgeon.  Sindy Messing, M.D.

## 2012-03-22 NOTE — Unmapped (Addendum)
Pt is ret 'ing your call - she can be reached at  [phone] after 4 pm today.     [name] Patient called. Advised see derm for destructive Rx. Thumb that holds the bow. I think this can be done without disabling her too badly. [name]

## 2012-04-10 ENCOUNTER — Ambulatory Visit: Admit: 2012-04-10 | Discharge: 2012-04-10 | Payer: PRIVATE HEALTH INSURANCE

## 2012-04-10 DIAGNOSIS — L84 Corns and callosities: Secondary | ICD-10-CM

## 2012-04-10 NOTE — Unmapped (Signed)
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 so there is constant pressure there  -has not really done much home treatment  -would like this treated as it is interfering with work    ROS: no other cutaneous concerns; allergies reviewed and recorded    PMH: No history of skin cancer    PE:  General: pleasant, in no acute distress  Skin: limited exam of hands per pt's request reveals 1.5 mm hyperkeratotic core-like lesion on right volar thumb distally with surrounding callous, c/w clavus    A/P  1) Clavus  -discussed dx with pt   -likely from long history as violinist and constant pressure to the area  -risks, benefits and alternatives discussed prior to procedure and pt gave consent - cleansed area with alcohol, pared down with 15 blade to remove core - provided pt with instant relief - no tenderness on palpation  -recommended donut-shaped corn pad to offload pressure  -recommended use of pumice stone several times per week after shower  -recommended CeraVe SA hand cream to keep hands and fingers soft and help decrease some of the hyperkeratosis on the thumb

## 2012-11-29 NOTE — Unmapped (Addendum)
Pt is c/o Vertigo for 2 days , she wants to know if you can work her in today or tom.  She is leaving town Sunday for a big trip and is concerned about having these sx.     Knute Neu Wones Yes. Please add to tomorrow's schedule. Alver Fisher

## 2012-11-29 NOTE — Unmapped (Signed)
appt scheduled, pt confirmed.

## 2012-11-30 ENCOUNTER — Ambulatory Visit: Admit: 2012-11-30 | Discharge: 2012-11-30 | Payer: PRIVATE HEALTH INSURANCE

## 2012-11-30 DIAGNOSIS — H8110 Benign paroxysmal vertigo, unspecified ear: Secondary | ICD-10-CM

## 2012-11-30 NOTE — Unmapped (Signed)
I think you have Benign Positional Vertigo. As the name implies, this is NOT a sign of anything serious and typically is not disabling.     If you want to have a medication with you that will help mitigate the symptoms, I would recommend meclizine 12.5 or 25 mg up to 3 times daily. Bonine is meclizine - available OTC.

## 2012-11-30 NOTE — Unmapped (Signed)
History  The patient is here for vertigo for the past 3 days. Clearly positional - laying back with head to the left. Lasts 20 seconds or so and resolves if she is still. She had this once before. No hearing problems. Vision OK. No neuro symptoms.     Exam  Appears well.   Ears normal.   EOMI. PERL. Some end-point nystagmus.   Normal cranial nerves.     Assessment and Plan  This is clearly Benign Positional Vertigo. Spent 15 minutes explaining how the inner ear works and what this condition is. I believe she will be fine with just understanding but information given about meclizine, salt modification, and exercises if she chooses.     Sindy Messing, M.D.

## 2014-04-01 NOTE — Unmapped (Signed)
Pt notified

## 2014-04-01 NOTE — Unmapped (Addendum)
Pt req referral to dermotologist for suspious skin tag.     [name] Dr. Joesph Fillers or any of the NP's who work down in derm. [name] for instance. Give her number to call and get appt. [name]

## 2014-07-28 ENCOUNTER — Ambulatory Visit: Admit: 2014-07-28 | Discharge: 2014-07-28 | Payer: PRIVATE HEALTH INSURANCE

## 2014-07-28 ENCOUNTER — Inpatient Hospital Stay: Admit: 2014-07-28 | Payer: PRIVATE HEALTH INSURANCE

## 2014-07-28 DIAGNOSIS — M1732 Unilateral post-traumatic osteoarthritis, left knee: Secondary | ICD-10-CM

## 2014-07-28 MED ORDER — bupivacaine (MARCAINE) 0.5 % (5 mg/mL) injection 10 mg
0.5 | Freq: Once | INTRAMUSCULAR | Status: AC
Start: 2014-07-28 — End: ?

## 2014-07-28 MED ORDER — triamcinolone acetonide (KENALOG-40) injection 80 mg
40 | Freq: Once | INTRAMUSCULAR | Status: AC
Start: 2014-07-28 — End: ?

## 2014-07-28 MED ORDER — lidocaine 10 mg/mL (1 %) injection 2 mL
10 | Freq: Once | INTRAMUSCULAR | Status: AC
Start: 2014-07-28 — End: ?

## 2014-07-28 NOTE — Unmapped (Signed)
[facility]     PATIENT NAME:  [name], [name]                  MRN:  [id number]  DATE OF BIRTH:  Aug 13, 1945                       CSN:  [id number]  PROVIDER:  Skipper Cliche, M.D.                VISIT DATE:  07/28/2014                                       OFFICE NOTE     [name] is a 69 year old female who is here for follow-up with left knee  pain.  She had a left knee lateral plateau fracture that was treated with  ORIF back in 2008 by Dr. Conley Rolls.  A couple of years later she had removal of  hardware due to increased pain.  She had been doing well up until about the  last year.  She has had more pain on a daily basis that is not really  tolerable.  She is here to discuss options for her pain.  She is complaining  of swelling and increased pain in the knee and oftentimes in the hip as well.  She denies any numbness or tingling, fevers or chills.     PHYSICAL EXAM:  Awake, alert and oriented x3, in no acute distress.  She  allows examination of the left lower extremity.  She has a full range of  motion of the hip and the knee.  She has crepitus throughout the knee range  of motion.  She has a slightly valgus deformity.  Some swelling on the medial  side.  Well healed surgical scar on the outside lateral part of the knee.  Nontender medial and lateral joint line.  Sensation intact throughout to  light touch.  Palpation DP pulse.     IMAGING:  Images reviewed and show increased changes of lateral compartment  osteoarthritis as well as some patellofemoral arthritis.     ASSESSMENT AND PLAN:  The patient is a 69 year old female with left knee  posttraumatic osteoarthritis.  We offered her a cortisone injection today  which she elected to proceed with.  Injected a 6 mL mixture without  complication.  We will have her work on some quad strengthening and she can  come back every three to four months to get repeat injections  as needed.     The patient was seen along side with Dr. Conley Rolls and he agrees with the plan of  care.     Dictated by:  Resident                                              Skipper Cliche, M.D.  TTL/fm  D:  07/28/2014 10:23  T:  07/29/2014 07:26  Job #:  [id number]           OFFICE NOTE  PAGE    1 of   1

## 2014-07-28 NOTE — Unmapped (Signed)
This office note has been dictated.

## 2014-07-28 NOTE — Unmapped (Signed)
Per provider's instructions, the patient was prepped for an injection - as well as drug  allergies were reviewed.   The appropriate medication was drawn, and documented.  A medical assistant was also present to assist with the injection.   The patient was then educated on what to expect following the injection.   All questions were answered.

## 2014-11-21 ENCOUNTER — Ambulatory Visit: Admit: 2014-11-21 | Discharge: 2014-11-21 | Payer: PRIVATE HEALTH INSURANCE

## 2014-11-21 DIAGNOSIS — L723 Sebaceous cyst: Secondary | ICD-10-CM

## 2014-11-21 NOTE — Unmapped (Signed)
History  The patient is here for:  ?? A small bump on back of her right leg for as long as a year. Not changing. No pain or symptoms.   ?? Fell and scraped left forearm a few days ago. Infected?    Medications   Home Meds the Patient Reports Taking   Medication Sig   ??? calcium carbonate-vitamin D3 600 mg(1,500mg ) -200 unit Tab Take  by mouth daily.   ??? ESTRACE 0.01 % (0.1 mg/gram) vaginal cream    ??? ibuprofen (ADVIL,MOTRIN) 200 MG tablet Take 200 mg by mouth daily as needed for Pain. Takes few times a week due to neck pain and occas bedtime   ??? propranolol (INDERAL) 20 MG tablet Take 20 mg by mouth 2 times a day. 1-2 by mouth, prn recital   ??? triamcinolone (KENALOG) 0.1 % ointment Apply topically. Apply as dir       Exam  Filed Vitals:    11/21/14 1148   BP: 82/54   Pulse: 76   Weight: 100 lb (45.36 kg)     General: The patient appears well.   Left Forearm: Significant abrasion but wound is clean and no pus. Some swelling and bruising.   Right Leg: Has a 1 cm sebaceous cyst behind right knee.   Skin: Has minor benign lesions that all appear benign. No rashes.     Assessment and Plan  ?? Sebaceous cyst - nothing required.   ?? Abrasion - no infection. Should heal OK.    Sindy Messing, M.D.

## 2015-10-31 ENCOUNTER — Inpatient Hospital Stay
Admit: 2015-10-31 | Discharge: 2015-10-31 | Disposition: A | Discharge status: Home / Self Care | Payer: Worker's Compensation

## 2015-10-31 ENCOUNTER — Emergency Department: Admit: 2015-10-31 | Payer: Worker's Compensation

## 2015-10-31 DIAGNOSIS — S52502A Unspecified fracture of the lower end of left radius, initial encounter for closed fracture: Secondary | ICD-10-CM

## 2015-10-31 MED ORDER — lidocaine-EPINEPHrine injection 1 mL
2 | Freq: Once | INTRAMUSCULAR | Status: AC
Start: 2015-10-31 — End: 2015-10-31
  Administered 2015-10-31: 18:00:00 1 mL

## 2015-10-31 MED ORDER — ibuprofen (ADVIL,MOTRIN) tablet 800 mg
400 | Freq: Once | ORAL | Status: AC
Start: 2015-10-31 — End: 2015-10-31
  Administered 2015-10-31: 18:00:00 800 mg via ORAL

## 2015-10-31 MED ORDER — HYDROcodone-acetaminophen (NORCO) 5-325 mg per tablet
5-325 | ORAL_TABLET | Freq: Four times a day (QID) | ORAL | 0.00 refills | 15.50000 days | Status: AC | PRN
Start: 2015-10-31 — End: 2016-02-18

## 2015-10-31 MED FILL — XYLOCAINE-MPF/EPINEPHRINE 2 %-1:200,000 INJECTION SOLUTION: 2 2 %-1:200,000 | INTRAMUSCULAR | Qty: 20

## 2015-10-31 MED FILL — IBUPROFEN 400 MG TABLET: 400 400 MG | ORAL | Qty: 2

## 2015-10-31 NOTE — Unmapped (Signed)
ED Attending Attestation Note    Date of service:  10/31/2015    This patient was seen by the advanced practice provider.  I have seen and examined the patient, agree with the workup, evaluation, management and diagnosis.  The care plan has been discussed and I concur.      My assessment reveals a 70 y.o. 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

## 2015-10-31 NOTE — Unmapped (Signed)
[facility] ED Note    Date of Service: 10/31/2015    Reason for Visit: Wrist Pain      Patient History     HPI:  This is a 69 y.o. female with PMH as noted below presenting with left wrist pain. 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 Earlier this afternoon, the patient was stretching her legs, lost her balance and fell from sitting height landing on her left wrist in a fleput ice on the wrist immexed position.  She had immediate onset of pain with minimal swelling.  She put ice on the wrist and wrapped it in an Ace bandage.  She is right-hand dominant.  She denies change in strength or sensation of her left upper extremity.  She is able to move all of her fingers without difficulty.    With the exception of the above, there are no aggravating or alleviating factors.    Past Medical History   Diagnosis Date   ??? Osteoporosis    ??? Low back pain    ??? Osteoarthritis    ??? Varicella    ??? Urticaria        Past Surgical History   Procedure Laterality Date   ??? Cesarean section  1985   ??? Leg surgery  05/09/06     fractured left tibia plateau   ??? Hardware removal  8/09     of left tibia plateau       [name]  reports that she has never smoked. She has never used smokeless tobacco. She reports that she drinks about 4.2 oz of alcohol per week. She reports that she does not use illicit drugs.    Discharge Medication List as of 10/31/2015  3:37 PM      START taking these medications    Details   HYDROcodone-acetaminophen (NORCO) 5-325 mg per tablet Take 1 tablet by mouth every 6 hours as needed for Pain., Starting 10/31/2015, Until Discontinued, Print         CONTINUE these medications which have NOT CHANGED    Details   calcium carbonate-vitamin D3 600 mg(1,500mg ) -200 unit Tab Take  by mouth daily., Until Discontinued, Historical Med      ESTRACE 0.01 % (0.1 mg/gram) vaginal cream Starting 05/29/2013, Until Discontinued, Historical Med      ibuprofen  (ADVIL,MOTRIN) 200 MG tablet Take 200 mg by mouth daily as needed for Pain. Takes few times a week due to neck pain and occas bedtime, Until Discontinued, Historical Med      propranolol (INDERAL) 20 MG tablet Take 20 mg by mouth 2 times a day. 1-2 by mouth, prn recital, Starting 05/15/2007, Until Discontinued, Historical Med      triamcinolone (KENALOG) 0.1 % ointment Apply topically. Apply as dir, Starting 10/31/2006, Until Discontinued, Historical Med             Allergies:   Allergies as of 10/31/2015 - Fully Reviewed 10/31/2015   Allergen Reaction Noted   ??? Iodine and iodide containing products Anaphylaxis    ??? Shellfish containing products         PMH: Nursing notes reviewed   PSH: Nursing notes reviewed   FH: Nursing notes reviewed   MEDS: Nursing notes and chart reviewed         Review of Systems     ROS:  A full 10-point review of systems was conducted and is otherwise negative unless noted above in HPI.    Physical Exam  Filed Vitals:    10/31/15 1253   BP: 126/70   Pulse: 82   Temp: 98.6 ??F (37 ??C)   TempSrc: Oral   Resp: 14   Height: 5' 5 (1.651 m)   Weight: 100 lb (45.36 kg)   SpO2: 100%       General:  well nourished; well developed; in no apparent distress     HEENT:  normocephalic, atraumatic;     Neck:  no meningismus; trachea midline     Pulmonary:   lung sounds clear to auscultation bilaterally with good air entry; no respiratory distress; no wheezes or rales     Cardiac:  regular rate and rhythm with no murmurs, rubs, or gallops     Musculoskeletal:  Left wrist volar deformity of the radius, radial pulses intact bilaterally, sensation intact distal to the site of injury, grip strength 3/5 in left hand    Vascular:  2+ peripheral pulses in bilateral upper extremities     Skin:  warm and well perfused without rashes or lesions     Neuro:  alert and oriented x 3; normal gait; strength and sensation grossly intact     Psych:  appropriate mood and affect         Diagnostic Studies     Labs: The  attending and I have reviewed laboratory findings.  Labs were reviewed and and significant values identified.    Please see electronic medical record for any tests performed in the ED     Labs Reviewed - No data to display    IMAGING STUDIES / RADIOLOGY: The attending and I have reviewed radiographic imaging.  Imaging studies were reviewed and and significant values identified.    Please see electronic medical record for any tests performed in the ED    X-ray Wrist Left min 3-views   Final Result   IMPRESSION:   Interval reduction of the distal radial fracture, with improved alignment.       Approved by Heywood Iles on 10/31/2015 3:28 PM EDT      I have personally reviewed the images and I agree with this report.      Report Verified by: ERIC Manson Passey, MD at 10/31/2015 3:28 PM EDT      Radius Ulna X-ray Left   Final Result   IMPRESSION:   Left wrist:   Comminuted, intra-articular fracture of the distal radius with deformity of the wrist due to apex volar angulation.      Left hand:   No other acute fractures identified. Severe first CMC joint arthrosis.      Left forearm:   No other acute fractures identified.      Approved by Heywood Iles on 10/31/2015 1:48 PM EDT      I have personally reviewed the images and I agree with this report.      Report Verified by: ERIC Manson Passey, MD at 10/31/2015 1:48 PM EDT      Wrist X-ray Left   Final Result   IMPRESSION:   Left wrist:   Comminuted, intra-articular fracture of the distal radius with deformity of the wrist due to apex volar angulation.      Left hand:   No other acute fractures identified. Severe first CMC joint arthrosis.      Left forearm:   No other acute fractures identified.      Approved by Heywood Iles on 10/31/2015 1:48 PM EDT      I have personally reviewed the images and I agree with  this report.      Report Verified by: ERIC Manson Passey, MD at 10/31/2015 1:48 PM EDT      Hand X-ray Left   Final Result   IMPRESSION:   Left wrist:   Comminuted, intra-articular  fracture of the distal radius with deformity of the wrist due to apex volar angulation.      Left hand:   No other acute fractures identified. Severe first CMC joint arthrosis.      Left forearm:   No other acute fractures identified.      Approved by Heywood Iles on 10/31/2015 1:48 PM EDT      I have personally reviewed the images and I agree with this report.      Report Verified by: Osvaldo Shipper, MD at 10/31/2015 1:48 PM EDT        Emergency Department Procedures     None    ED Course and MDM     Vital signs, medical history, social history, allergies and nursing notes reviewed.    Vitals:  BP 126/70 mmHg   Pulse 82   Temp(Src) 98.6 ??F (37 ??C) (Oral)   Resp 14   Ht 5' 5 (1.651 m)   Wt 100 lb (45.36 kg)   BMI 16.64 kg/m2   SpO2 100%    Briefly this is a is a 70 y.o. female who presents to the emergency department with left wrist pain.  Patient presented afebrile with normal vitals.  Patient was in no acute distress, nontoxic and non-hypoxic.  Patient was able to complete full sentences at bedside.  Patient was not using accessory muscles.  Patient was able to cooperate with history and physical exam.  Patient's previous charts, labs and imaging was reviewed.  Please see HPI and physical for further details.    The patient presented to the emergency department for evaluation of left wrist pain after fall from standing height and landing on her left wrist in a flexed position.  She is right-hand dominant.  She is a Psychologist, clinical.  She had no motor, neuro or vascular deficits distal to the site of injury.  There was obvious deformity to the radius on physical exam.  X-rays were completed showing a comminuted, intra-articular fracture of the distal radius with deformity of the wrist due to apex volar angulation.  Orthopedics was consulted.  Ortho performed a hematoma block as well as a closed reduction.  She was placed in a sugar tong splint.  Repeat x-rays were completed showing interval reduction of the distal  radius fracture with improved alignment.  She will follow-up with Dr. Venetia Maxon of orthopedics on an outpatient basis later this week.  She remained neurovascularly intact distal to the site of injury after reduction.    The patient was seen and evaluated by the attending physician Dr. Marjo Bicker who agreed with the assessment and plan.  The patient and / or the family were informed of the results of any tests, a time was given to answer questions, a plan was proposed and they agreed with plan.     The patient was given pertinent discharge instructions related to their diagnosis, reasons to return to the emergency department as well as follow up instructions.       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 Alois Cliche  - Will staff with attending  - Dispo: follow up with Dr. Venetia Maxon this coming week for re-evaluation and surgical planning    Ruixian Crist Infante, MD ??            Medications administered in the ED:    Medications   ibuprofen (ADVIL,MOTRIN) tablet 800 mg (800 mg Oral Given 10/31/15 1400)   lidocaine-EPINEPHrine injection 1 mL (1 mL Other Given 10/31/15 1401)       Impression     1. Closed fracture of distal end of left radius, unspecified fracture morphology, initial encounter         Plan     1. The patient is to be discharged home in stable/improved condition.  2. Workup, treatment and diagnosis were discussed with the patient and/or family members; the patient agrees to the plan and all questions were addressed and answered.  3. The patient is instructed to return to the emergency department should her symptoms worsen or any concern she believes warrants acute physician evaluation.  4. The patient will be discharged on the following medications, which were discussed with  the patient:   - Norco #12            [facility], PA  11/01/15 [phone]

## 2015-10-31 NOTE — Unmapped (Signed)
l wrist pain, fell landed on it, ice in place,

## 2015-10-31 NOTE — Unmapped (Signed)
You were evaluated in the emergency department for left wrist pain and found to have a distal radius fracture. Your fracture was reduced by orthopedics and splinted. It is important to keep this wrist stable, no lifting with your left hand until cleared by orthopedics. Keep your splint clean and dry.   You may take Norco or over the counter ibuprofen as needed for severe pain, but use caution, as Norco this medication can cause sedation, and you should not drink alcohol, drive, or operate machinery while taking this medication.    Please follow up with Dr Venetia Maxon in Orthopedics this week. Call to make an appointment     Return for worsening of current symptoms, developing a fever greater than 101.5 that is not reduced with Tylenol, developing numbness or tingling in your fingers or developing of new concerning symptoms.

## 2015-10-31 NOTE — Unmapped (Signed)
Patient stated she was stretching her leg when she went to put her leg back down she lost her balance and fell catching herself with her hand. Patient now has right wrist pain.

## 2015-10-31 NOTE — Unmapped (Signed)
[facility]  Department of Orthopaedic Surgery  Consult Note  Date: 10/31/2015  MRN: [id number]    Reason for consult: L DR fx  Requesting service: EM  Ortho attending: Dr. 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 Alois Cliche  - Will staff with attending  - Dispo: follow up with Dr. Venetia Maxon this coming week for re-evaluation and surgical planning    Townes Fuhs Crist Infante, MD   Orthopaedic Surgery Resident  Pager [phone]  ______________________________________________________________________    HPI:  [name] is a 70 y.o. female RHD with L Colles fracture. She is a neighbor to Dr. 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 This morning, she was rehearsing for a show tonight. During the intermission, she bent over to stretch and FOOSH. She has pain and deformity in L wrist without any numbness or tingling. She has no other injuries. She wishes to have nonoperative treatment if possible. She has no play to retire from playing violin any time soon.     Past Medical History   Diagnosis Date   ??? Osteoporosis    ??? Low back pain    ??? Osteoarthritis    ??? Varicella    ??? Urticaria        Past Surgical History   Procedure Laterality Date   ??? Cesarean section  1985   ??? Leg surgery  05/09/06     fractured left tibia plateau   ??? Hardware removal  8/09     of left tibia plateau         (Not in a hospital admission)    Allergies   Allergen Reactions   ??? Iodine And Iodide Containing Products Anaphylaxis     Respiratory Distress   ??? Shellfish Containing Products        Social History   Substance Use Topics   ??? Smoking status: Never Smoker    ??? Smokeless tobacco: Never Used   ??? Alcohol Use: 4.2 oz/week     7 Cans of beer per week       Family  History   Problem Relation Age of Onset   ??? Stroke Mother    ??? Melanoma Neg Hx        ROS:  Negative except for HPI    Physical Exam:  Temp:  [98.6 ??F (37 ??C)] 98.6 ??F (37 ??C)  Heart Rate:  [82] 82  Resp:  [14] 14  BP: (126)/(70) 126/70 mmHg    General: NAD  HEENT: NCAT  CV/P: Unlabored breathing  ABD: Soft    Musculoskeletal:  RUE:    No point tenderness or gross deformity   Vascular: radial pulse 2+, cap refill <2 sec   Strength: 5/5 deltoids, biceps, triceps, wrist flexors, wrist extensors, intrinsics, grip strength   AIN/PIN/IO intact    Sensation: radial, median, ulnar, axillary nerves intact  LUE:   L wrist deformity and TTP   Vascular: radial pulse 2+, cap refill <2 sec   Strength: 5/5 deltoids, biceps, triceps, wrist flexors, wrist extensors, intrinsics, grip strength   AIN/PIN/IO intact   Sensation: radial, median, ulnar, axillary nerves intact    Pertinent Labs  No results for input(s): WBC,  HGB, HCT, MCV, PLT in the last 72 hours.  No results for input(s): K, NA, CL, CO2 in the last 72 hours.  No results for input(s): BUN, CREATININE in the last 72 hours.  No results for input(s): INR in the last 72 hours.    Imaging  Radius Ulna X-ray Left    10/31/2015  EXAM: XR HAND LEFT MINIMUM 3-VIEWS, XR RADIUS ULNA LEFT 2-VIEWS, XR WRIST LEFT MINIMUM 3-VIEWS dated 10/31/2015 1:02 PM EDT CLINICAL HISTORY: Pain; COMPARISON: None FINDINGS: Left hand: Partially visualized distal radial fracture, described below. No fracture of the hand is identified. 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 The remaining joint spaces are relatively well-preserved. Left wrist: Comminuted, intra-articular fracture of the distal radius. There is approximately 40 degrees of apex volar angulation. There is deformity of the wrist with dorsal translation of the carpus. Left forearm: Partially visualized distal radial fracture, described above. No other fractures of the forearm are identified.     10/31/2015  IMPRESSION:  Left wrist: Comminuted, intra-articular fracture of the distal radius with deformity of the wrist due to apex volar angulation. Left hand: No other acute fractures identified. Severe first CMC joint arthrosis. Left forearm: No other acute fractures identified. Approved by Heywood Iles on 10/31/2015 1:48 PM EDT I have personally reviewed the images and I agree with this report. Report Verified by: Osvaldo Shipper, MD at 10/31/2015 1:48 PM EDT    X-ray Wrist Left Min 3-views    10/31/2015  EXAM: XR WRIST LEFT MINIMUM 3-VIEWS dated 10/31/2015 3:00 PM EDT CLINICAL HISTORY: Fracture;  post reduction; COMPARISON: Wrist radiograph from earlier today FINDINGS: Splint material overlies the wrist and hand, limiting evaluation of fine bony detail. Interval reduction of the distal radial fracture. There is improved alignment of the comminuted distal radial fracture with approximately 15 degrees apex dorsal angulation.     10/31/2015  IMPRESSION: Interval reduction of the distal radial fracture, with improved alignment. Approved by Heywood Iles on 10/31/2015 3:28 PM EDT I have personally reviewed the images and I agree with this report. Report Verified by: Osvaldo Shipper, MD at 10/31/2015 3:28 PM EDT    Wrist X-ray Left    10/31/2015  EXAM: XR HAND LEFT MINIMUM 3-VIEWS, XR RADIUS ULNA LEFT 2-VIEWS, XR WRIST LEFT MINIMUM 3-VIEWS dated 10/31/2015 1:02 PM EDT CLINICAL HISTORY: Pain; COMPARISON: None FINDINGS: Left hand: Partially visualized distal radial fracture, described below. No fracture of the hand is identified. 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 The remaining joint spaces are relatively well-preserved. Left wrist: Comminuted, intra-articular fracture of the distal radius. There is approximately 40 degrees of apex volar angulation. There is deformity of the wrist with dorsal translation of the carpus. Left forearm: Partially visualized distal radial fracture, described above. No other fractures of the  forearm are identified.     10/31/2015  IMPRESSION: Left wrist: Comminuted, intra-articular fracture of the distal radius with deformity of the wrist due to apex volar angulation. Left hand: No other acute fractures identified. Severe first CMC joint arthrosis. Left forearm: No other acute fractures identified. Approved by Heywood Iles on 10/31/2015 1:48 PM EDT I have personally reviewed the images and I agree with this report. Report Verified by: Osvaldo Shipper, MD at 10/31/2015 1:48 PM EDT    Hand X-ray Left    10/31/2015  EXAM: XR HAND LEFT MINIMUM 3-VIEWS, XR RADIUS ULNA LEFT 2-VIEWS, XR WRIST LEFT MINIMUM 3-VIEWS dated 10/31/2015 1:02 PM EDT CLINICAL HISTORY: Pain; COMPARISON: None FINDINGS:  Left hand: Partially visualized distal radial fracture, described below. No fracture of the hand is identified. 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 The remaining joint spaces are relatively well-preserved. Left wrist: Comminuted, intra-articular fracture of the distal radius. There is approximately 40 degrees of apex volar angulation. There is deformity of the wrist with dorsal translation of the carpus. Left forearm: Partially visualized distal radial fracture, described above. No other fractures of the forearm are identified.     10/31/2015  IMPRESSION: Left wrist: Comminuted, intra-articular fracture of the distal radius with deformity of the wrist due to apex volar angulation. Left hand: No other acute fractures identified. Severe first CMC joint arthrosis. Left forearm: No other acute fractures identified. Approved by Heywood Iles on 10/31/2015 1:48 PM EDT I have personally reviewed the images and I agree with this report. Report Verified by: Osvaldo Shipper, MD at 10/31/2015 1:48 PM EDT

## 2015-11-03 ENCOUNTER — Inpatient Hospital Stay: Admit: 2015-11-03 | Payer: Worker's Compensation | Attending: Sports Medicine

## 2015-11-03 ENCOUNTER — Ambulatory Visit: Admit: 2015-11-03 | Discharge: 2015-11-03 | Payer: Worker's Compensation

## 2015-11-03 DIAGNOSIS — S52532A Colles' fracture of left radius, initial encounter for closed fracture: Secondary | ICD-10-CM

## 2015-11-03 NOTE — Unmapped (Signed)
This office note has been dictated.    I saw and evaluated the patient, and discussed with the resident. I agree with the resident???s findings and plan as documented in the resident???s note.

## 2015-11-03 NOTE — Unmapped (Signed)
[facility] AND SPORTS MEDICINE     PATIENT NAME: MATISHA, TERMINE                 MRN: [id number]  DATE OF BIRTH: Jan 30, 1946                      CSN: [id number]  PROVIDER: Kallie Edward, M.D.                 VISIT DATE: 11/03/2015                                NEW PATIENT OFFICE VISIT     CHIEF COMPLAINT:  Left distal radius fracture.     HISTORY OF PRESENT ILLNESS:  Avaeh Ewer is a 70 year old right hand  dominant woman who comes in for evaluation of a left distal radius fracture.  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  She was seen in the emergency room where a closed reduction was  performed, and she has been in a sugar-tong splint.  Her injury occurred on  10/31/2015, so she is now three days out.  She denies numbness or tingling in  the fingers.  She does have a history of osteoporosis and is currently on  Fosamax.  She had a prior tibial plateau fracture fixed by Dr. Conley Rolls.     PAST MEDICAL HISTORY:  1.  Low back pain.  2.  Varicella urticaria.     PAST SURGICAL HISTORY:  1.  C-section.  2.  Tibial plateau fracture fixation and hardware removal.     MEDICATION(S):  Current medications include -  1.  Propranolol.  2.  Triamcinolone cream.  4.  Fosamax as mentioned.     ALLERGIES:  1.  Iodine.  2.  Shellfish-containing products.     FAMILY HISTORY:  Noncontributory to the chief complaint.     SOCIAL HISTORY:  The patient drinks several alcoholic beverages per week.  She does not smoke.  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     REVIEW OF SYSTEMS:  Negative except as noted in the HPI.     PHYSICAL EXAMINATION:  This is a well-appearing woman, alert, conversant, and  in no acute distress.  She is thin.  Examination of the left upper extremity  demonstrates swollen fingers distal to  the splint.  Sensation intact to light  touch median, radial, and ulnar nerve distributions in the hand.  She is  nearly able to make a fist, range of motion limited by swelling.  All digits  are warm and well-perfused.  She has a sugar-tong splint immobilizing the  wrist and elbow presently.  Webril was split given her swelling, visualized  skin intact.     IMAGING:  Initial x-rays along with post reduction films and interval films  today were all reviewed and demonstrate a distal radius fracture,  intraarticular, with dorsal angulation and acceptable post reduction  alignment; however, this is an unstable pattern, and there has been some loss  of radial height and loss of reduction on her interval films today.  There  appears to be a large volar ulnar  piece, suspect this is unstable as well.     ASSESSMENT:  Keana Dueitt is a 70 year old woman, a violinist with the  [employer], with a left distal radius fracture, date of  injury 10/31/2015.     PLAN:  We discussed with her that this is likely an unstable injury.  We are  concerned that she may need surgical fixation since she does need full  supination in order to play her violin.  We would like to obtain a CT scan to  further evaluate the intraarticular fragmentation and will see her back in  one week with repeat x-rays in the splint for further evaluation.  All of her  questions were answered today.     Dictated by Jaynee Eagles, M.D.                                              Kallie Edward, M.D.  PJS/jlq  D:  11/03/2015 17:26  T:  11/05/2015 13:15  Job #:  [id number]           NEW PATIENT OFFICE VISIT                                     PAGE    1 of   1

## 2015-11-04 NOTE — Unmapped (Signed)
[name] is scheduled for June 2 @ 2:20 pm @ [facility]. FU scheduled for June 6 @ 3:30 pm @ [facility]. Ins is [employer] Auth# [id number] good from 11-04-15 to 12-03-15. Patient is aware of both appointments.

## 2015-11-04 NOTE — Unmapped (Signed)
PCN CARE COORDINATION   Reason for call: 10/31/15 ER Visit- fell on her left wrist and fractured her wrist.   Next Steps:Patient saw Dr. Venetia Maxon on 11/03/15. Dr. Venetia Maxon will be ordering at CT scan of her wrist within the next week to develop a plan for her wrist surgery since the patient is a concert Investment banker, operational. Patient educated on keeping her left wrist elevated above her heart as much as possible, ice, and movement of her fingers. Patient went to the ER because of her profession, and she is friends with Dr. Trena Platt.   Date of follow-up:11/12/15 with Dr. Venetia Maxon. CT date TBD. Follow-up with PCP to be determined next week after visit with Dr. Venetia Maxon.    Patient stated that her arm does hurt, but she has been taking Advil. If the pain is too much, she will take a Norco. If she takes two Norco, then she gets sick. Encouraged to take these meds with food.

## 2015-11-06 ENCOUNTER — Inpatient Hospital Stay: Admit: 2015-11-06 | Payer: PRIVATE HEALTH INSURANCE

## 2015-11-06 DIAGNOSIS — S52532A Colles' fracture of left radius, initial encounter for closed fracture: Secondary | ICD-10-CM

## 2015-11-10 ENCOUNTER — Ambulatory Visit: Admit: 2015-11-10 | Discharge: 2015-11-10 | Payer: Worker's Compensation

## 2015-11-10 ENCOUNTER — Inpatient Hospital Stay: Admit: 2015-11-10 | Payer: Worker's Compensation

## 2015-11-10 ENCOUNTER — Inpatient Hospital Stay: Payer: PRIVATE HEALTH INSURANCE

## 2015-11-10 DIAGNOSIS — M25532 Pain in left wrist: Secondary | ICD-10-CM

## 2015-11-10 DIAGNOSIS — S52572D Other intraarticular fracture of lower end of left radius, subsequent encounter for closed fracture with routine healing: Secondary | ICD-10-CM

## 2015-11-10 DIAGNOSIS — S52532D Colles' fracture of left radius, subsequent encounter for closed fracture with routine healing: Secondary | ICD-10-CM

## 2015-11-10 NOTE — Unmapped (Signed)
This office note has been dictated.

## 2015-11-10 NOTE — Unmapped (Signed)
[facility] AND SPORTS MEDICINE     PATIENT NAME:  [name], [name]                  MRN:  [id number]  DATE OF BIRTH:  02/17/1946                       CSN:  [id number]  PROVIDER:  Kallie Edward, M.D.                  VISIT DATE:  11/10/2015                                       OFFICE NOTE     The patient is here for a left distal radius fracture. She is a 70 year old,  right hand dominant woman, who plays in the [address]. She had an  injury on 10/31/2015 where she fell and had a left distal radius fracture.  She was seen last week, was put in a splint and sent for a CT scan. She  returns today for follow-up.  She denies any numbness in her hand.  Pain is  well-controlled.     PHYSICAL EXAMINATION:  She has got some swelling with some bruising. She has  got full range of motion of her fingers of her left hand. Sensation is intact  to light touch throughout her whole hand and fingers are warm and  well-perfused with brisk cap refill.     Three views of the left wrist were obtained today which redemonstrate the  left distal radius fracture. There has been interval collapse of the radial  styloid with loss of the radial inclination.  The CT of the wrist was also  reviewed which does show the fracture of the distal radius with collapse on  the radial aspect, loss of radial inclination. There is questionable sagittal  splint seen on the coronal films going intraarticular that is nondisplaced.     ASSESSMENT AND PLAN:  Left distal radius fracture.  I had a long discussion  with the patient and her husband.  Given that she plays very high level of  violin in the [employer] that given the alignment of her fracture  right now, we would recommend surgical fixation for the best chance of being  able to return to playing violin at that high level. There is a risk that  given the injury she will have stiffness and lose some of her  range of  motion, especially pronation and supination that could limit her from  returning to the same level of playing that she was at before.  However, we  do believe that surgery would be the best chance to get there.  Risks and  benefits of the surgery were explained to the patient.  She does understand  that she will be out for at least a couple months after surgery before being  able to return to play.  They do have a trip over to [place] in late August  that she would like to try to get back to and said that is a possibility, but  it would be cutting it close.  After discussing risks and benefits of the  surgery, she would like to proceed.  We will plan  on getting her fixed in the  near very future.  All of her other questions today were answered.     Dictated by: Bettina Gavia Milta Deiters, M.D.                                              Kallie Edward, M.D.  PJS/kh  D:  11/10/2015 16:38  T:  11/12/2015 17:12  Job #:  [id number]           OFFICE NOTE                                                  PAGE    1 of   1

## 2015-11-10 NOTE — Unmapped (Signed)
[phone]    I saw and evaluated the patient, and discussed with the resident. I agree with the resident???s findings and plan as documented in the resident???s note.

## 2015-11-12 MED FILL — CEFAZOLIN 1 GRAM SOLUTION FOR INJECTION: 1 1 g | INTRAMUSCULAR | Qty: 1000

## 2015-11-13 ENCOUNTER — Inpatient Hospital Stay: Admit: 2015-11-13 | Payer: Worker's Compensation

## 2015-11-13 ENCOUNTER — Ambulatory Visit: Admit: 2015-11-13 | Payer: Worker's Compensation

## 2015-11-13 DIAGNOSIS — R52 Pain, unspecified: Secondary | ICD-10-CM

## 2015-11-13 MED ORDER — LORazepam (ATIVAN) 2 mg/mL injection
2 | Freq: Once | INTRAMUSCULAR | Status: AC
Start: 2015-11-13 — End: 2015-11-13

## 2015-11-13 MED ORDER — HYDROmorphone (DILAUDID) injection Syrg 0.2 mg
1 | INTRAMUSCULAR | Status: AC | PRN
Start: 2015-11-13 — End: 2015-11-13

## 2015-11-13 MED ORDER — HYDROmorphone (DILAUDID) injection Syrg 0.4 mg
1 | INTRAMUSCULAR | Status: AC | PRN
Start: 2015-11-13 — End: 2015-11-13

## 2015-11-13 MED ORDER — proMETHazine (PHENERGAN) injection 6.25 mg
25 | Freq: Four times a day (QID) | INTRAMUSCULAR | Status: AC | PRN
Start: 2015-11-13 — End: 2015-11-13

## 2015-11-13 MED ORDER — oxyCODONE (ROXICODONE) 5 MG immediate release tablet
5 | ORAL | Status: AC
Start: 2015-11-13 — End: 2015-11-13

## 2015-11-13 MED ORDER — fentaNYL (SUBLIMAZE) injection 25 mcg
50 | INTRAMUSCULAR | Status: AC | PRN
Start: 2015-11-13 — End: 2015-11-13
  Administered 2015-11-13 (×2): 25 ug via INTRAVENOUS

## 2015-11-13 MED ORDER — lidocaine (PF) 2% (20 mg/mL) Soln 20 mg
20 | Freq: Once | INTRAMUSCULAR | Status: AC | PRN
Start: 2015-11-13 — End: 2015-11-13

## 2015-11-13 MED ORDER — LORazepam (ATIVAN) 2 mg/mL injection
2 | INTRAMUSCULAR | Status: AC
Start: 2015-11-13 — End: 2015-11-13

## 2015-11-13 MED ORDER — lactated Ringers infusion
INTRAVENOUS | Status: AC
Start: 2015-11-13 — End: 2015-11-13

## 2015-11-13 MED ORDER — fentaNYL (SUBLIMAZE) 50 mcg/mL injection
50 | INTRAMUSCULAR | Status: AC
Start: 2015-11-13 — End: 2015-11-13

## 2015-11-13 MED ORDER — HYDROmorphone (DILAUDID) injection Syrg 0.1 mg
1 | INTRAMUSCULAR | Status: AC | PRN
Start: 2015-11-13 — End: 2015-11-13

## 2015-11-13 MED ORDER — lactated Ringers infusion
INTRAVENOUS | Status: AC
Start: 2015-11-13 — End: 2015-11-13
  Administered 2015-11-13: 14:00:00 20 mL/h via INTRAVENOUS

## 2015-11-13 MED ORDER — acetaminophen (TYLENOL) 325 MG tablet
325 | ORAL | Status: AC
Start: 2015-11-13 — End: 2015-11-13

## 2015-11-13 MED ORDER — oxyCODONE-acetaminophen (PERCOCET) 5-325 mg per tablet
5-325 | ORAL_TABLET | Freq: Four times a day (QID) | ORAL | Status: AC | PRN
Start: 2015-11-13 — End: 2016-02-18

## 2015-11-13 MED ORDER — oxyCODONEROXICODONEimmediatereleasetablet5mg
5 | Freq: Once | ORAL | Status: AC | PRN
Start: 2015-11-13 — End: 2015-11-13
  Administered 2015-11-13: 19:00:00 5 mg via ORAL

## 2015-11-13 MED ORDER — fentaNYL (SUBLIMAZE) injection 50 mcg
50 | INTRAMUSCULAR | Status: AC | PRN
Start: 2015-11-13 — End: 2015-11-13
  Administered 2015-11-13: 17:00:00 50 ug via INTRAVENOUS

## 2015-11-13 MED ORDER — ceFAZolin (ANCEF) 1 g in sodium chloride 0.9% 50 mL ADDaptor IVPB
INTRAVENOUS | Status: AC | PRN
Start: 2015-11-13 — End: 2015-11-13
  Administered 2015-11-13: 15:00:00 1 g via INTRAVENOUS

## 2015-11-13 MED ORDER — HYDROmorphone (DILAUDID) injection Syrg 0.6 mg
1 | INTRAMUSCULAR | Status: AC | PRN
Start: 2015-11-13 — End: 2015-11-13

## 2015-11-13 MED ORDER — sodium chloride, irrigation 0.9 % irrigation
0.9 | Status: AC | PRN
Start: 2015-11-13 — End: 2015-11-13
  Administered 2015-11-13: 15:00:00 500

## 2015-11-13 MED ORDER — naloxone (NARCAN) injection 0.04 mg
0.4 | INTRAMUSCULAR | Status: AC | PRN
Start: 2015-11-13 — End: 2015-11-13

## 2015-11-13 MED ORDER — LORazepam (ATIVAN) 2 mg/mL injection
2 | Freq: Once | INTRAMUSCULAR | Status: AC
Start: 2015-11-13 — End: 2015-11-13
  Administered 2015-11-13: 17:00:00 0.5 mg via INTRAVENOUS

## 2015-11-13 MED ORDER — fentaNYL (SUBLIMAZE) injection 12.5 mcg
50 | INTRAMUSCULAR | Status: AC | PRN
Start: 2015-11-13 — End: 2015-11-13

## 2015-11-13 MED ORDER — dexamethasone (DECADRON) injection
4 | INTRAMUSCULAR | Status: AC | PRN
Start: 2015-11-13 — End: 2015-11-13
  Administered 2015-11-13: 15:00:00 4 via INTRAVENOUS

## 2015-11-13 MED ORDER — lactated Ringers infusion
INTRAVENOUS | Status: AC | PRN
Start: 2015-11-13 — End: 2015-11-13
  Administered 2015-11-13: 15:00:00 via INTRAVENOUS

## 2015-11-13 MED ORDER — fentaNYL (SUBLIMAZE) injection 6.5 mcg
50 | INTRAMUSCULAR | Status: AC | PRN
Start: 2015-11-13 — End: 2015-11-13

## 2015-11-13 MED ORDER — ondansetron (ZOFRAN) 4 mg/2 mL injection
4 | INTRAMUSCULAR | Status: AC | PRN
Start: 2015-11-13 — End: 2015-11-13
  Administered 2015-11-13: 16:00:00 4 via INTRAVENOUS

## 2015-11-13 MED ORDER — ondansetron (ZOFRAN) 4 mg/2 mL injection 4 mg
4 | Freq: Three times a day (TID) | INTRAMUSCULAR | Status: AC | PRN
Start: 2015-11-13 — End: 2015-11-13

## 2015-11-13 MED ORDER — propofol 10 mg/ml (DIPRIVAN) injection
10 | INTRAVENOUS | Status: AC | PRN
Start: 2015-11-13 — End: 2015-11-13
  Administered 2015-11-13: 15:00:00 150 via INTRAVENOUS

## 2015-11-13 MED ORDER — acetaminophen (TYLENOL) tablet 975 mg
325 | ORAL | Status: AC | PRN
Start: 2015-11-13 — End: 2015-11-13
  Administered 2015-11-13: 14:00:00 975 mg via ORAL

## 2015-11-13 MED ORDER — midazolam (PF) (VERSED) injection
1 | INTRAMUSCULAR | Status: AC | PRN
Start: 2015-11-13 — End: 2015-11-13
  Administered 2015-11-13 (×2): 1 via INTRAVENOUS

## 2015-11-13 MED ORDER — oxyCODONE (ROXICODONE) immediate release tablet 10 mg
5 | Freq: Once | ORAL | Status: AC | PRN
Start: 2015-11-13 — End: 2015-11-13

## 2015-11-13 MED ORDER — lidocaine (PF) 20 mg/mL (2 %) Soln
20 | INTRAVENOUS | Status: AC | PRN
Start: 2015-11-13 — End: 2015-11-13
  Administered 2015-11-13: 15:00:00 40 via INTRAVENOUS

## 2015-11-13 MED ORDER — fentaNYL (SUBLIMAZE) injection
50 | INTRAMUSCULAR | Status: AC | PRN
Start: 2015-11-13 — End: 2015-11-13
  Administered 2015-11-13 (×2): 50 via INTRAVENOUS
  Administered 2015-11-13: 16:00:00 100 via INTRAVENOUS
  Administered 2015-11-13 (×2): 25 via INTRAVENOUS

## 2015-11-13 MED FILL — FENTANYL (PF) 50 MCG/ML INJECTION SOLUTION: 50 50 mcg/mL | INTRAMUSCULAR | Qty: 2

## 2015-11-13 MED FILL — OXYCODONE 5 MG TABLET: 5 5 MG | ORAL | Qty: 1

## 2015-11-13 MED FILL — TYLENOL 325 MG TABLET: 325 325 mg | ORAL | Qty: 3

## 2015-11-13 MED FILL — XYLOCAINE-MPF 20 MG/ML (2 %) INJECTION SOLUTION: 20 20 mg/mL (2 %) | INTRAMUSCULAR | Qty: 5

## 2015-11-13 MED FILL — LACTATED RINGERS INTRAVENOUS SOLUTION: 20.00 20.00 mL/hr | INTRAVENOUS | Qty: 500

## 2015-11-13 MED FILL — LORAZEPAM 2 MG/ML INJECTION SOLUTION: 2 2 mg/mL | INTRAMUSCULAR | Qty: 1

## 2015-11-13 NOTE — Unmapped (Signed)
Feeling much better, resp easy/reg

## 2015-11-13 NOTE — Unmapped (Signed)
[facility]  DEPARTMENT OF ANESTHESIOLOGY  PRE-PROCEDURAL EVALUATION    [name] is a 70 y.o. year old female presenting for:    Procedure(s):  OPEN REDUCTION INTERNAL FIXATION LEFT DISTAL RADIUS FRACTURE    Surgeon:   Kathrene Bongo, MD    Chief Complaint     Fracture of distal radius and ulna, left, closed, initial encounter [S52.5*    Review of Systems     Anesthesia Evaluation    Patient summary reviewed and CPC/PAT note reviewed.       History of anesthetic complications (Slow to wake up)         Cardiovascular:      (-) pacemaker, hypertension, past MI, CAD, CABG/stent.    Neuro/Muscoloskeletal/Psych:      (-) seizures, CVA, no anxiety, no depression.     Pulmonary:      (-) COPD, asthma, recent URI, sleep apnea, no PE.       GI/Hepatic/Renal:      (-) GERD, liver disease, renal disease.    Endo/Other:        (-) diabetes mellitus, hypothyroidism, hyperthyroidism, no DVT.       Past Medical History     Past Medical History   Diagnosis Date   ??? Osteoporosis    ??? Low back pain    ??? Osteoarthritis    ??? Varicella    ??? Urticaria    ??? History of fall        Past Surgical History     Past Surgical History   Procedure Laterality Date   ??? Cesarean section  1985   ??? Leg surgery  05/09/06     fractured left tibia plateau   ??? Hardware removal  8/09     of left tibia plateau       Family History     Family History   Problem Relation Age of Onset   ??? Stroke Mother    ??? Melanoma Neg Hx        Social History     Social History     Social History   ??? Marital Status: Married     Spouse Name: N/A   ??? Number of Children: N/A   ??? Years of Education: N/A     Occupational History   ??? Not on file.     Social History Main Topics   ??? Smoking status: Never Smoker    ??? Smokeless tobacco: Never Used   ??? Alcohol Use: 4.2 oz/week     7 Cans of beer per week   ??? Drug Use: No      Comment: 12-23-2009   ??? Sexual Activity:     Partners: Male     Other Topics Concern   ??? Not on file     Social History Narrative       Medications      Allergies:  Allergies   Allergen Reactions   ??? Iodine And Iodide Containing Products Anaphylaxis     Respiratory Distress   ??? Shellfish Containing Products        Home Meds:  Prior to Admission medications as of 11/13/15 0905   Medication Sig Taking?   calcium carbonate-vitamin D3 600 mg(1,500mg ) -200 unit Tab Take  by mouth daily. Yes   HYDROcodone-acetaminophen (NORCO) 5-325 mg per tablet Take 1 tablet by mouth every 6 hours as needed for Pain. Yes   ESTRACE 0.01 % (0.1 mg/gram) vaginal cream     ibuprofen (ADVIL,MOTRIN) 200 MG  tablet Take 200 mg by mouth daily as needed for Pain. Takes few times a week due to neck pain and occas bedtime    triamcinolone (KENALOG) 0.1 % ointment Apply topically. Apply as dir        Inpatient Meds:  Scheduled:   ??? acetaminophen         Continuous:   ??? lactated Ringers 20 mL/hr (11/13/15 0936)       PRN: ceFAZolin (ANCEF) IVPB, lidocaine (PF) 2% (20 mg/mL)    Vital Signs     Wt Readings from Last 3 Encounters:   11/13/15 100 lb (45.36 kg)   11/10/15 100 lb (45.36 kg)   11/03/15 100 lb (45.36 kg)     Ht Readings from Last 3 Encounters:   11/13/15 5' 5 (1.651 m)   11/10/15 5' 5 (1.651 m)   11/03/15 5' 5 (1.651 m)     Temp Readings from Last 3 Encounters:   11/13/15 98.1 ??F (36.7 ??C) Oral   10/31/15 98.6 ??F (37 ??C) Oral     BP Readings from Last 3 Encounters:   11/13/15 135/62   10/31/15 126/70   11/21/14 82/54     Pulse Readings from Last 3 Encounters:   11/13/15 83   10/31/15 82   11/21/14 76     SpO2 Readings from Last 3 Encounters:   11/13/15 100%   10/31/15 100%       Physical Exam     Airway:     Mallampati: II  Mouth Opening: >2 FB  TM distance: > = 3 FB  Neck ROM: full    Dental:   - No obvious cracked, loose, chipped, or missing teeth.     Pulmonary:       Breath sounds clear to auscultation.     (-) no PE.    Cardiovascular:     Rhythm: regular  Rate: normal    Neuro/Musculoskeletal/Psych:    Mental status: alert and oriented to person, place and  time.          Abdominal:       Current OB Status:       Other Findings:        Laboratory Data     No results found for: WBC, HGB, HCT, MCV, PLT    No results found for: [facility]    Lab Results   Component Value Date    GLUCOSE 77 12/30/2009    GLUCOSE 77 12/30/2009    BUN 18 12/30/2009    BUN 18 12/30/2009    CO2 27 12/30/2009    CO2 27 12/30/2009    CREATININE 1.02 12/30/2009    CREATININE 1.02 12/30/2009    K 4.1 12/30/2009    K 4.1 12/30/2009    NA 137 12/30/2009    NA 137 12/30/2009    CL 100 12/30/2009    CL 100 12/30/2009    CALCIUM 9.8 12/30/2009    CALCIUM 9.8 12/30/2009    ALBUMIN 4.6 12/30/2009    ALBUMIN 4.6 12/30/2009    PROT 7.1 12/30/2009    PROT 7.1 12/30/2009    ALKPHOS 80 12/30/2009    ALT 13 12/30/2009    ALT 13 12/30/2009    AST 24 12/30/2009    AST 24 12/30/2009    BILITOT 0.7 12/30/2009    BILITOT 0.7 12/30/2009       No results found for: PTT, INR    No results found for: PREGTESTUR, PREGSERUM, HCG, HCGQUANT    Anesthesia Plan  ASA 2       Female and current non-smoker    Anesthesia Type:  general LMA.     (Anesthetic plan:     This patient will undergo general anesthesia with LMA and one PIV to start the case (a second PIV may be started should additional access be necessary). Standard ASA monitoring will be used throughout the operative course. IV opioids will be used for intra-operative pain control. Anti-nausea prophylaxis will also be used. Post-operatively the patient will be taken to the PACU for anesthesia recovery and on-going pain control. This anesthetic plan was discussed with the patient as well as risk, benefits, and alternatives. All questions were entertained and answered to the patient's satisfaction.    )      Anesthetic plan and risks discussed with patient.    Plan, alternatives, and risks of anesthesia, including death, have been explained to and discussed with the patient/legal guardian.  By my assessment, the patient/legal guardian understands and agrees.  Scenario  presented in detail.  Questions answered.      Plan discussed with CRNA.

## 2015-11-13 NOTE — Unmapped (Signed)
Anesthesia Transfer of Care Note    Patient: [name]  Procedure(s) Performed: Procedure(s):  OPEN REDUCTION INTERNAL FIXATION LEFT DISTAL RADIUS FRACTURE    Patient location: PACU    Anesthesia type: general LMA    Airway Device on Arrival to PACU/ICU: Nasal Cannula    IV Access: Peripheral    Monitors Recommended to be Used During PACU/ICU: Standard Monitors    Outstanding Issues to Address: None    Level of Consciousness: awake and responds to stimulation    Post vital signs:    Filed Vitals:    11/13/15 1236   BP: 154/85   Pulse: 89   Temp: 97.6 ??F (36.4 ??C)   Resp: 19   SpO2: 98%       Complications: None      Date 11/12/15 0700 - 11/13/15 0659(Not Admitted) 11/13/15 0700 - 11/14/15 0659   Shift 0700-1459 1500-2259 2300-0659 24 Hour Total 0700-1459 1500-2259 2300-0659 24 Hour Total   I  N  T  A  K  E   I.V.     800  (17.6)   800  (17.6)      Volume (mL) (lactated Ringers infusion)     800   800    Shift Total  (mL/kg)     800  (17.6)   800  (17.6)   O  U  T  P  U  T   Blood     25   25      Est Blood Loss     25   25    Shift Total  (mL/kg)     25  (0.6)   25  (0.6)   Weight (kg)     45.4 45.4 45.4 45.4

## 2015-11-13 NOTE — Unmapped (Signed)
OPEN REDUCTION INTERNAL FIXATION LEFT DISTAL RADIUS FRACTURE  Procedure Note    [name]  11/13/2015      Pre-op Diagnosis: Fracture of distal radius and ulna, left, closed, initial encounter [S52.502A, S52.602A]       Post-op Diagnosis: Same    Procedure(s):  OPEN REDUCTION INTERNAL FIXATION LEFT DISTAL RADIUS FRACTURE      Surgeon(s):  Kathrene Bongo, MD    Anesthesia: General    Staff:   Circulator: Fransico Him, RN  Relief Scrub: Jim Desanctis, RN  Scrub Person: Merry Lofty, ST  Fellow: Natale Milch, MD  2nd Scrub: Jadene Pierini, RN  Resident: Nash Dimmer, MD    Estimated Blood Loss: Minimal                 Specimens: * No specimens in log *           Drains:             There were no complications unless listed below.        [name]     Date: 11/13/2015  Time: 12:08 PM

## 2015-11-13 NOTE — Unmapped (Signed)
Anesthesia Extubation Criteria:    Airway Device: laryngeal mask airway    Emergence Details:      Smooth      _x_      Stormy       __       Prolonged   __     Extubation Criteria:      Motor strength intact       _x_      Follows commands        _x_      Good airway reflexes      _x_      OP suctioned                  _x_        Follows commands:  Yes     Patient extubated:  Yes

## 2015-11-13 NOTE — Unmapped (Signed)
To BR with assistance, voided without difficulty

## 2015-11-13 NOTE — Unmapped (Signed)
[facility]                       MEDICAL CENTER     PATIENT NAME:   [name], [name]               MRN: [id number]  DATE OF BIRTH:  [birth year]                     CSN: [id number]  SURGEON:        Kallie Edward, M.D.           ADMIT DATE: 11/13/2015  SERVICE:        Orthopedic Surgery  DICTATED BY:    Angelina Pih, M.D.         SURGERY DATE: 11/13/2015                                    OPERATIVE REPORT     PREOPERATIVE DIAGNOSIS(ES):  Left distal radius fracture.     POSTOPERATIVE DIAGNOSIS(ES):  Left distal radius fracture.     PROCEDURE(S) PERFORMED:  Open reduction and internal fixation of left distal  radius fracture.     SURGEON: Kallie Edward, M.D.     ASSISTANTS:  Angelina Pih, M.D.; Bettina Gavia. Milta Deiters, M.D.     ANESTHESIA: LMA.     COMPLICATIONS:  None.     SPECIMEN(S): None.     IMPLANTS:  1. Synthes three hole volar locking plate.  2. 2.4 mm locking screws x5.  3. Synthes 2.4 mm cortical screws x3.     INDICATIONS FOR PROCEDURE(S):  The patient is a 70 year old female who had a  low fall on 10/31/15 sustaining an injury to her left wrist.  Radiographs were  obtained and a left distal radius fracture was identified.  Based on the  degree of displacement, her functional demands and the unstable nature of her  fracture, it was recommended that she undergo open reduction and internal  fixation.  A thorough discussion of the risks and benefits was held with the  patient and her husband in clinic. Ultimately they elected to proceed.     DETAILS OF PROCEDURE(S):   The patient was met in the preoperative holding  area.  The surgical site was identified and marked with a skin marker.  The  patient was then transferred to the operating room.  She was positioned  supine on the operating table with a hand table attached. LMA anesthesia was  then induced by the anesthesia team.  A well-padded tourniquet was applied to  the left upper arm.  The left  upper extremity was then prepped and draped in  the usual sterile fashion.  We then performed a formal timeout verifying the  correct patient, surgical site and procedure to be performed.  When all were  in agreement, the procedure was initiated.     The left upper extremity was exsanguinated using an Esmarch bandage.  The  tourniquet was inflated to 250 mmHg.  A standard volar approach to the distal  radius was then performed.  We identified the flexor carpi radialis tendon  within its sheath.  The sheath was incised longitudinally.  The tendon was  then retracted ulnarly. The floor of the sheath was then incised and  dissection was taken down to the FPL.  The FPL was swept ulnarly revealing  the underlying pronator  quadratus.  The pronator quadratus was elevated from  the distal radius with an L-shaped incision along its radial and distal  borders.  The muscle belly was elevated from the distal radius using an AO  type elevator.  The fracture site was then thoroughly debrided of soft tissue  using a 15 blade and a pickup.  The fracture was then reduced with  longitudinal traction and volar to dorsal directed pressure along the  proximal fragment.  The wrist was then flexed to bring the distal fragment  into reduction along the volar cortex.  A volar locking plate was then  positioned along the volar cortex of the radius.  This was provisionally  pinned in place with K wires.  We then obtained fluoroscopy verifying  satisfactory plate position and reduction of the fracture.  We did reposition  the plate to assure we were satisfied with it. The plate was then fixed in  place using a 2.4 mm screw in the oblong slot on the plate.  We fine-tuned  our plate position with this screw slightly loosened.  It was then tightened  down.  We then secured fixation distally with five 2.4 mm locking screws.  We  then secured the remainder of our proximal fixation with two additional 2.4  mm cortical screws.  We then obtained final  fluoroscopic images verifying  satisfactory reduction and fixation on AP and lateral views.     We then thoroughly irrigated the wound with normal saline and closed it in a  layered fashion with Prolene to reapproximate the pronator quadratus and 4-0  Monocryl for the subcutaneous tissue and the subcuticular layer.  The wound  was then dressed with Xeroform gauze, Webril and a well-padded volar slab  splint.  The tourniquet was deflated prior to closure to verify that  satisfactory hemostasis had been achieved.  LMA anesthesia was then reserved  and the patient was transferred to the PACU in stable condition.     POSTOPERATIVE PLAN:  The patient will remain in her splint as instructed.  She will follow up with Dr. Venetia Maxon in 10 to 14 days for a wound check.  We may  consider transitioning her to a cast at that time given the relatively  unstable nature of her fracture and her poor bone quality which may limit our  fixation.     Dr. Venetia Maxon was present for the key and critical portions of this case  including reduction of the fracture, application of the locking plate and  fluoroscopic verification of satisfactory reduction and fixation.                                               Kallie Edward, M.D.  CC/fm                                  Dictated by:  Angelina Pih, M.D.  D:  11/13/2015 13:40  T:  11/13/2015 22:25  Job #:  [id number]           OPERATIVE REPORT  PAGE    1 of   1

## 2015-11-13 NOTE — Unmapped (Signed)
INTRA-OP POST BRIEFING NOTE: [name]      Specimens:     Prior to leaving the room: Nurse confirmed name of procedure, completion of instrument, sponge & needle counts, reads specimen labels aloud including patient name and addresses any equipment issues? Nurse confirmed wound class. Nurse to surgeon and anesthesia: What are key concerns for recovery and management of the patient?  Yes      Blood products stored at appropriate temperatures prior to return to blood bank (if applicable)? N/A      Patient identification band secured on patient prior to transfer out of the operating room? Yes      Other Comments:     Signed: Austyn Perriello SCHWEIGHART-HUGHES    Date: 11/13/2015    Time: 12:08 PM

## 2015-11-13 NOTE — Unmapped (Addendum)
ORTHOPAEDICS AND SPORTS MEDICINE         HAND SURGERY - Kathrene Bongo, MD     POST-OPERATIVE DISCHARGE INSTRUCTIONS    PRESCRIPTIONS:     ?? Percocet 5/325 take 1-2 tablet(s) by mouth every 4-6 hours as needed for pain.    ?? You may also take over the counter ibuprofen/aleve and tylenol for pain. Take this as directed on the packaging. Do not exceed 3000 mg tylenol/acetaminophen in 24 hours.     Ibuprofen 600-800 mg (3-4) tablets by mouth every 6 hours as needed for pain.      OR   Aleve 2 tablets by mouth every 12 hours (twice daily) as needed for pain.      AND/OR   Tylenol 1000 mg (2 tablets) every 6-8 hours as needed for pain.     ICE AND ELEVATION:     ?? You may use ice for the first 48-72 hours, but it is not critical.      ?? Motion of your fingers is very important s to decrease the swelling. Please follow the finger range of motion exercises below to assist you in regaining your motion. This should be done at least 10 repetitions 3-4 times a day.     ?? Elevation, as much as possible for the next 48 hours, is critical for decreasing swelling as well as for pain relief. Elevation means when you are seated or lying down, you hand should be at or above your heart. When walking, the hand needs to be at or above the level of your elbow.     ?? If the bandage gets too tight, it may need to be looseded. Please contact our office and we will instruct you in how to do this.     SURGICAL BANDAGES:     ?? Please keep the bandage as it is and keep it dry.   ?? You may place a plastic bag over your bandage to shower, but be careful.   ?? Your bandages will be removed at your first postoperative visit.   ?? After the bandages have been removed, it is OK to get the stitches wet in a shower or with hand washing. No baths or tub soaks yet. Please do not use lotions or creams on the stitches.       HAND THERAPY:     ?? You may not need any. If you do, we will begin this at your follow up visit in the clinic.     ACTIVITY AND  WORK:    ?? You are encouraged to move any fingers which are not in the bandage. Attached is an instruction sheet on finger motion. Do this as much as you need to make your fingers move fully and keep the swelling down.     ?? Light use of the fingers is allowed to assist the other hand with daily hygiene and eating, but strong gripping or lifting is often uncomfortable and detrimental to the healing process and should be avoided.     ?? You might miss a variable period of time from work and hopefully this issue has been discussed prior to surgery.     ?? Please share this information with your employer. Please bring any forms that need to be filled out to your office visit or contact Antonieta Pert in our office for assistance at [phone]. Our fax number is [phone].     EXPECTATIONS:    PAIN:    ?? Hopefully, within  the first 4-5 days, you pain will lessen to where you will need less narcotic pain medication.    ??  Please use your pain medication carefully, as refills are limited and you may not be provided with one.     ?? Prescriptions are only filled in person during a clinic visit.     ?? As stated above, please  use over the counter pain medicine - it will also be helpful with decreasing your swelling.     SCAR:     ?? Please DO NOT apply any lotions or creams on your scar until you have been instructed to do so by Dr. Venetia Maxon or your Hand Therapist.     ?? The scar will mature over the next 6 months to a year and will become much less visible and tender. The tenderness can be improved by rubbing the scar gently and using lotion after the stitches have been removed.      CONCERNS:    ?? If your discomfort or swelling increases, after initially getting better in the first few days, infection may be a concern. Normal time for infection is the fourth or fifth day.     ?? If you experience symptoms that you are concerned about, please contact our office at [phone] or Amy at [phone].     ?? You may need to come  into the clinic for an evaluation.     ?? For after hour EMERGENCIES please call [phone] and ask for the Orthopaedic Physician on call.     FOLLOW-UP APPOINTMENT:     ?? Please call [phone] to make the appointment to see Dr. Venetia Maxon in 10-14 days.         Instructions Following General Anesthesia    You were given medicines that made you unconscious while a procedure was performed. For as long as 24 hours following this procedure, you may have:  ??? Dizziness, weakness, or drowsiness  ??? Sore throat or hoarseness. ??? Nausea or vomiting     For the first 24 hours following anesthesia:  ?? Have a responsible person with you at all times.  ?? Do not participate in any activities where you could become injured for the next 24 hours, or until you feel normal again.  ?? Do not drive a car or operate heavy machinery.  ?? Do not drink alcohol, take sleeping pills, or other medications that cause drowsiness.  ?? Do not sign important papers or make important decisions.  ?? Only take medicine that has been prescribed by your caregiver.  For pain, only take over-the-counter or prescribed medicines for pain, discomfort, or fever as directed by your caregiver.  ?? If you wear a CPAP/BiPAP device, please wear it anytime that you are resting or feel tired.  ?? You may resume a normal diet unless otherwise directed.  Vomiting may occur if you eat too soon.  When you can drink without vomiting, try water, juice, or soup.  Try solid foods if you feel little or no nausea.  ?? If you have questions or problems that seem related to anesthesia, call the hospital [phone]) and ask for the anesthesiologist on call.    SEEK IMMEDIATE MEDICAL CARE IF:   ??? You develop a rash.  ??? You have chest pain.  ??? You continue to feel sick to your stomach (nauseated) or throw up (vomit). ??? You have difficulty breathing.  ??? You develop any allergic reactions.  ??? You are unable to urinate.

## 2015-11-13 NOTE — Unmapped (Signed)
Taking PO and fluids, tol well; family at side

## 2015-11-13 NOTE — Unmapped (Signed)
Pt feeling anxious, resp sl rapid; anesthesia notified, pt medicated per order see MAR; family at side

## 2015-11-13 NOTE — Unmapped (Signed)
Anesthesia Post Note    Patient: [name]    Procedure(s) Performed: Procedure(s):  OPEN REDUCTION INTERNAL FIXATION LEFT DISTAL RADIUS FRACTURE    Anesthesia type: general LMA    Patient location: Same Day Surgery    Post pain: Adequate analgesia    Post assessment: no apparent anesthetic complications and tolerated procedure well    Last Vitals:   Filed Vitals:    11/13/15 1245 11/13/15 1305 11/13/15 1320 11/13/15 1345   BP: 149/84 160/85 148/86 143/89   Pulse: 89 95 90 88   Temp:    97.9 ??F (36.6 ??C)   TempSrc:    Oral   Resp: 10 35 28 11   Height:       Weight:       SpO2: 100% 100% 100% 100%        Post vital signs: stable    Level of consciousness: awake, alert  and oriented    Complications: None

## 2015-11-13 NOTE — Unmapped (Signed)
Per dr. Venetia Maxon no post op x rays needed.

## 2015-11-17 NOTE — Unmapped (Signed)
Per request, I have faxed op report, 11/10/15 office visit, recent xray reports and next scheduled appointment with Dr Venetia Maxon to [facility] @ [phone]. Fax success 11/17/15 @ 8:37am

## 2015-11-20 NOTE — Unmapped (Signed)
Per Donetta Potts request, Submitted *Retro* C9 for ORIF left distal radius sx CPT [id number] done 11/13/15  Faxed to [facility] @ [phone]. Fax success 11/20/15 @ 11:02am

## 2015-11-24 ENCOUNTER — Inpatient Hospital Stay: Payer: Worker's Compensation | Attending: Hand Surgery

## 2015-11-24 ENCOUNTER — Inpatient Hospital Stay: Admit: 2015-11-24 | Payer: Worker's Compensation | Attending: Hand Surgery

## 2015-11-24 ENCOUNTER — Ambulatory Visit: Admit: 2015-11-24 | Discharge: 2015-11-24 | Payer: Worker's Compensation

## 2015-11-24 DIAGNOSIS — S52572D Other intraarticular fracture of lower end of left radius, subsequent encounter for closed fracture with routine healing: Secondary | ICD-10-CM

## 2015-11-24 DIAGNOSIS — M25532 Pain in left wrist: Secondary | ICD-10-CM

## 2015-11-24 NOTE — Unmapped (Signed)
BJYN#[id number]    I saw and evaluated the patient, and discussed with the resident. I agree with the resident???s findings and plan as documented in the resident???s note.

## 2015-11-24 NOTE — Unmapped (Signed)
[facility] AND SPORTS MEDICINE     PATIENT NAME:  [name], [name]                  MRN:  [id number]  DATE OF BIRTH:  Jan 28, 1946                       CSN:  [id number]  PROVIDER:  Kallie Edward, M.D.                  VISIT DATE:  11/24/2015                                       OFFICE NOTE     The patient is here for follow-up of left distal radius open reduction  internal fixation that was performed on 11/13/2015.  She has remained in her  sugar tong splint.  Pain is well-controlled.  No numbness or tingling.  No  signs or symptoms of infection.  No fevers, chills.     REVIEW OF SYSTEMS:  Otherwise negative.     PHYSICAL EXAMINATION:  She is a thin white woman, very thin in nature.  Examination of the left upper extremity shows well-healing incision with very  minimal swelling of the wrist, slight bruising around the elbow and around  the incision.  No redness or drainage.  She has full range of motion of her  fingers.  Fingers are warm and well-perfused with good capillary refill.  Range of motion of the wrist was not tested today.  Supination-pronation not  tested today.     IMAGING:  Three views of the left wrist were obtained today, which show the  hardware in place.  It does appear both on the AP and lateral that she has  had slight movement of the distal fracture fragment with slight loss of volar  tilt with now space between the distal plate and the bone as well as very  minimal loss of height.  There are no screws in the joint apparent on any of  the views of the x-ray.     ASSESSMENT AND PLAN:  Status post left distal radius fracture.  I had a long  discussion with the patient about that the bone did shift slightly; however,  it is still in okay alignment.  We would prefer keeping her in a short arm  cast to try to keep her immobilized to try to get the bone to heal.  I had  discussion with the patient that once the bone heals, if it  heals in this  position it is in acceptable alignment; however, given that the distal part  of the plate is off the bone she would require removal of the plate to  prevent any possible tendon attrition and rupture.  This would be done  probably six months down the road once the bone has healed.  She was placed  into a short arm cast today.  She will remain in that and we will see her  back in one week with x-rays in the cast.  All of her questions today were  answered.     DICTATED BY:  Bettina Gavia. Milta Deiters, M.D.  Kallie Edward, M.D.  PJS/wls  D:  11/24/2015 16:45  T:  11/26/2015 10:38  Job #:  [id number]           OFFICE NOTE                                                  PAGE    1 of   1

## 2015-11-24 NOTE — Unmapped (Signed)
Received retro approval with disclaimer for surgery, approved 11/13/15. Auth # [id number]. (Surery performed on 11/13/15)

## 2015-11-27 NOTE — Unmapped (Signed)
Submitted [employer] 14- off 11/01/15 to est 01/01/16, attached 6/20 office note. Advised of follow up scheduled 12/01/15. Faxed to [facility] @ [phone].  Received confirmation 6/23 11:40 am

## 2015-12-01 ENCOUNTER — Inpatient Hospital Stay: Admit: 2015-12-01 | Payer: Worker's Compensation | Attending: Hand Surgery

## 2015-12-01 ENCOUNTER — Ambulatory Visit: Admit: 2015-12-01 | Discharge: 2015-12-01 | Payer: Worker's Compensation

## 2015-12-01 DIAGNOSIS — S52502D Unspecified fracture of the lower end of left radius, subsequent encounter for closed fracture with routine healing: Secondary | ICD-10-CM

## 2015-12-01 DIAGNOSIS — M25532 Pain in left wrist: Secondary | ICD-10-CM

## 2015-12-01 NOTE — Unmapped (Signed)
[facility] AND SPORTS MEDICINE     PATIENT NAME:  [name], [name]                  MRN:  [id number]  DATE OF BIRTH:  May 04, 1946                       CSN:  [id number]  PROVIDER:  Kallie Edward, M.D.                  VISIT DATE:  12/01/2015                                       OFFICE NOTE     The patient is here for follow-up of left distal radius fracture status post  open reduction and internal fixation performed on 11/13/2015.  She has been  in a short arm cast tolerating it well, does not feel it has become too  loose.  Pain is well-controlled.  No numbness or tingling.  No signs or  symptoms of infection, no fevers or chills.     PHYSICAL EXAMINATION:  Examination of the left upper extremity shows fingers  are warm and well-perfused. She has got good and full finger range of motion.  The cast is clean and intact. The cast fits appropriately, is not too loose.  Sensation is intact to light touch on her fingers.     Three views of the left wrist were obtained today in the cast which shows the  fracture with the hardware in place, no change in alignment from last week  and no signs of hardware failure or loosening.     ASSESSMENT AND PLAN:  Status post left distal radius fracture open reduction  and internal fixation.  At this point, will continue with the wrist in a  short arm cast.  Will see her back in 2 weeks with x-rays out of the cast.  If the fracture is in the same position showing healing, at that point will  likely transition to a removable splint.     Dictated by: Bettina Gavia Milta Deiters, M.D.                                              Kallie Edward, M.D.  PJS/kh  D:  12/01/2015 20:01  T:  12/03/2015 22:17  R:  12/04/2015 10:15 - akg  Job #:  [id number]           OFFICE NOTE                                                  PAGE    1 of   1

## 2015-12-01 NOTE — Unmapped (Signed)
[phone]

## 2015-12-01 NOTE — Unmapped (Signed)
This office note has been dictated.    I saw and evaluated the patient, and discussed with the resident. I agree with the resident???s findings and plan as documented in the resident???s note.

## 2015-12-04 NOTE — Unmapped (Signed)
Faxed 6/27 office note to [facility] @ [phone]. Advised of follow up scheduled 12/15/15.  Received confirmation 6/30 11:04 am

## 2015-12-15 ENCOUNTER — Encounter

## 2015-12-18 ENCOUNTER — Inpatient Hospital Stay: Admit: 2015-12-18 | Payer: Worker's Compensation | Attending: Hand Surgery

## 2015-12-18 ENCOUNTER — Inpatient Hospital Stay: Payer: Worker's Compensation | Attending: Hand Surgery

## 2015-12-18 ENCOUNTER — Ambulatory Visit: Admit: 2015-12-18 | Discharge: 2015-12-18 | Payer: Worker's Compensation

## 2015-12-18 DIAGNOSIS — S52532D Colles' fracture of left radius, subsequent encounter for closed fracture with routine healing: Secondary | ICD-10-CM

## 2015-12-18 NOTE — Unmapped (Signed)
This office note has been dictated.

## 2015-12-18 NOTE — Unmapped (Signed)
[facility] AND SPORTS MEDICINE     PATIENT NAME:  [name], [name]                  MRN:  [id number]  DATE OF BIRTH:  April 25, 1946                       CSN:  [id number]  PROVIDER:  Kallie Edward, M.D.                  VISIT DATE:  12/18/2015                                       OFFICE NOTE     The patient underwent open reduction and internal fixation of a left distal  radius fracture on 11/13/15. She is doing extremely well in a short-arm splint.     EXAM: Full range of motion of the fingers.     Sensibility intact to light touch.     She has nearly full pronation and supination.  Wrist extension is  approximately 40 degrees and flexion 30 degrees.  She is nontender over the  distal radius.  Mild - moderate swelling.     Radiographs reveal a well-reduced distal radius fracture with apparent union.     ASSESSMENT: Doing well following left distal radius fracture.     PLAN: I spoke with Fransico Setters regarding therapy.  She will start on a  program of active range of motion but will stay in the splint for an  additional two weeks.  At six weeks, the splint can maybe be discontinued and  I will see her again in three weeks with x-rays.                                              Kallie Edward, M.D.  PJS/tlm  D:  12/18/2015 17:30  T:  12/22/2015 17:04  Job #:  [id number]           OFFICE NOTE                                                  PAGE    1 of   1

## 2015-12-24 NOTE — Unmapped (Signed)
Submitted C9 for OT, attached 7/14 office note. Submitted [employer] 14- off 12/18/15 to est 01/18/16. Faxed to [facility] @ [phone].  Received confirmation 7/20 10:54 am

## 2015-12-28 NOTE — Unmapped (Signed)
Received approval for OT, approved 12/18/15-01/28/16. Auth # [id number]. Given to [facility] for record.

## 2016-01-05 ENCOUNTER — Ambulatory Visit: Admit: 2016-01-05 | Discharge: 2016-01-05 | Payer: Worker's Compensation

## 2016-01-05 ENCOUNTER — Inpatient Hospital Stay: Admit: 2016-01-05 | Payer: Worker's Compensation

## 2016-01-05 DIAGNOSIS — M25532 Pain in left wrist: Secondary | ICD-10-CM

## 2016-01-05 DIAGNOSIS — S52502D Unspecified fracture of the lower end of left radius, subsequent encounter for closed fracture with routine healing: Secondary | ICD-10-CM

## 2016-01-05 NOTE — Unmapped (Signed)
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 She is limited by flexibility/ range of motion of her left wrist. Otherwise, she is doing well since last visit. Has been working with [facility] hand and wrist therapy with active range of motion. Has also been wearing her removable splint. She denies pain and is not requiring narcotic pain medications. She does report some numbness over her proximal palm.    Physical Exam:  M/R/U grossly intact, palpable radial/ulnar pulses  Neurovascularly intact in all digits  Left wrist with mild ulnar prominence  Left wrist ROM: 40/40, radial deviation 0, ulnar deviation 30, full pronation and supination, full range of motion of fingers  Right wrist ROM: 70/80, radial deviation 5, ulnar deviation 50, full pronation and supination, full range of motion of fingers. 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    Radiology:  Three views left wrist with stable reduction and alignment of prior distal radius fracture. Volar plate intact without signs of hardware loosening or failure. Distal volar-edge slightly proud.     ASSESSMENT/PLAN:  Doing well following left distal radius fracture s/p ORIF on 11/13/15. She is frustrated with her reduced range of motion at this point, however she is progressing quite nicely. I reassured her that she is on course and is making excellent strides. She will continue program of active range of motion and no longer requires splinting. Return to clinic in 4 weeks - no need for further XRs.      I saw and examined the patient.  I discussed with the resident or fellow and agree with the resident's findings and plan as documented in the note.

## 2016-01-05 NOTE — Unmapped (Signed)
This office note has been dictated.

## 2016-01-18 NOTE — Unmapped (Signed)
Submitted [employer] 14- off 01/05/16 to est 02/05/16, attached 8/1 office note. Advised of follow up scheduled 02/02/16. Faxed to [facility] @ [phone].  Received confirmation 8/14 9:06 am

## 2016-02-02 ENCOUNTER — Inpatient Hospital Stay: Admit: 2016-02-02 | Payer: Worker's Compensation | Attending: Surgery of the Hand

## 2016-02-02 ENCOUNTER — Ambulatory Visit: Admit: 2016-02-02 | Discharge: 2016-02-02 | Payer: Worker's Compensation

## 2016-02-02 DIAGNOSIS — S52502D Unspecified fracture of the lower end of left radius, subsequent encounter for closed fracture with routine healing: Secondary | ICD-10-CM

## 2016-02-02 DIAGNOSIS — M25532 Pain in left wrist: Secondary | ICD-10-CM

## 2016-02-02 NOTE — Unmapped (Signed)
This office note has been dictated.

## 2016-02-02 NOTE — Unmapped (Signed)
[facility] AND SPORTS MEDICINE     PATIENT NAME:  DELILA, KUKLINSKI                  MRN:  [id number]  DATE OF BIRTH:  [birth year]                       CSN:  [id number]  PROVIDER:  Kallie Edward, M.D.                  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  On November 13, 2015, she underwent volar plate fixation.  She is slowly rehabbing and is  back to playing the violin although nowhere near the level she would like.     Complaints today include numbness in all fingers (she always has baseline  tingling).  In addition, she has pain over the ulnar head, particularly when  she pronates while playing the violin.     Examination reveals prominence of the ulnar head.  There is a positive Tinel  sign at the wrist flexion creases to the middle and ring fingers.  Pronation  and supination are full.  Wrist extension 70 degrees, flexion 60 degrees.  Radial deviation 5 degrees and ulnar deviation 35 degrees.  Grip strength 35  pounds right and 15 pounds left.  Two-point discrimination all fingers is 5  mm.     Radiographs show a healed distal radius fracture.  The palmar plate is  slightly prominent distally.     ASSESSMENT:  1. Healed left distal radius fracture with some pain at distal radioulnar     joint.  2. Healed left distal radius fracture with median nerve paresthesias.     PLAN:  1. Continue therapy.  2. Electrodiagnostic studies.  3. Reevaluate following studies.                                              Kallie Edward, M.D.  PJS/ja  D:  02/02/2016 14:35  T:  02/04/2016 09:14  Job #:  [id number]           OFFICE NOTE                                                  PAGE    1 of   1

## 2016-02-05 NOTE — Unmapped (Signed)
Submitted C9 for EMG LUR and physical therapy, attached 8/29 office note. Submitted [employer] 14- off 02/02/16 to est 04/06/16. Faxed to [facility] @ [phone].  Received confirmation 9/1 10:32 am

## 2016-02-10 NOTE — Unmapped (Signed)
[name] is scheduled for Sept12 @ 9 am @ [facility]. FU scheduled for Sept 15 @ 2:20 pm @ [name]. This was approved by [facility], patient is aware of both appointments.

## 2016-02-10 NOTE — Unmapped (Signed)
Received approval for EMG LUE and physical therapy, approved 02/04/16-03/25/16. Auth # [id number]. Given to [facility] for scheduling.

## 2016-02-16 ENCOUNTER — Encounter

## 2016-02-17 ENCOUNTER — Encounter

## 2016-02-18 ENCOUNTER — Ambulatory Visit: Admit: 2016-02-18 | Discharge: 2016-02-18 | Payer: Worker's Compensation

## 2016-02-18 DIAGNOSIS — G5602 Carpal tunnel syndrome, left upper limb: Secondary | ICD-10-CM

## 2016-02-18 NOTE — Unmapped (Signed)
[facility]  Department of Neurology and Rehabilitation  [address] [place], [address]  [place], [place] [address]  [phone] Fax [phone]  Diplomate, American Board of Physical Medicine and Rehabilitation           Test Date:  02/18/2016    Patient: [name] DOB: 28-Nov-1945 Physician: Darvin Neighbours, DO   Sex: Female Height: '  Ref Phys: Kathrene Bongo, MD   ID#: [id number] Weight:  lbs. 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 She underwent volar plate fixation on 11/13/15 by Dr. Venetia Maxon. She reports wearing a cast 10 days prior to surgery and then another cast 4 weeks after surgery. She has completed 12 therapy sessions. She complains of constant tingling and numbness in thumb and 2nd-4th digit. She denies weakness. No history of DM.        NCV & EMG Findings:  Evaluation of the Left median motor nerve showed prolonged distal onset latency (6.5 ms) and reduced amplitude (2.5 mV).  The Left ulnar motor nerve showed decreased conduction velocity (A Elbow-B Elbow, 46 m/s).  The Left median sensory nerve showed prolonged distal peak latency (6.2 ms) and decreased conduction velocity (Wrist-2nd Digit, 23 m/s).  The Left ulnar sensory nerve showed prolonged distal peak latency (4.2 ms) and decreased conduction velocity (Wrist-5th Digit, 33 m/s).  All remaining nerves (as indicated in the following tables) were within normal limits.      All examined muscles (as indicated in the following table) showed no evidence of electrical instability.      Impression:  1. 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 Affecting the myelin and axon  b. Affecting the sensory and motor nerves  c. Moderate in severity  2. There is electrodiagnostic evidence of a left ulnar sensory  mononeuropathy.   3. There is no electrodiagnostic evidence of a ulnar mononeuropathy at the elbow (ie cubital tunnel syndrome).   4. There is no electrodiagnostic evidence of a LUE radiculopathy or myopathy.           ___________________________  Darvin Neighbours, DO        Nerve Conduction Studies  Anti Sensory Summary Table     Site NR Peak (ms) Norm Peak (ms) P-T Amp (??V) Norm P-T Amp Site1 Site2 Delta-P (ms) Dist (cm) Vel (m/s) Norm Vel (m/s)   Left Median Anti Sensory (2nd Digit)  29.3??C   Wrist    6.2 <3.6 23.6 >19 Wrist 2nd Digit 6.2 14.0 23 >39   Left Radial Anti Sensory (Base 1st Digit)  28.1??C   Wrist    3.1 <3.1 22.8  Wrist Base 1st Digit 3.1 0.0     Left Ulnar Anti Sensory (5th Digit)  30??C   Wrist    4.2 <3.7 23.3 >15.0 Wrist 5th Digit 4.2 14.0 33 >38     Motor Summary Table     Site NR Onset (ms) Norm Onset (ms) O-P Amp (mV) Norm O-P Amp Site1 Site2 Delta-0 (ms) Dist (cm) Vel (m/s) Norm Vel (m/s)   Left Median Motor (Abd Poll Brev)  29.8??C   Wrist    6.5 <4.2 2.5 >5 Elbow Wrist 3.3 17.0 52 >50   Elbow    9.8  2.8          Left Ulnar Motor (Abd Dig  Minimi)  28.1??C   Wrist    3.6 <4.2 8.6 >3 B Elbow Wrist 3.2 17.0 53 >49   B Elbow    6.8  7.9  A Elbow B Elbow 2.4 11.0 46 >49   A Elbow    9.2  7.2            EMG     Side Muscle Nerve Root Ins Act Fibs Psw Amp Dur Poly Recrt Int Dennie Bible Comment   Left Biceps Musculocut C5-6 Nml Nml Nml Nml Nml 0 Nml Nml    Left Triceps Radial C6-7-8 Nml Nml Nml Nml Nml 0 Nml Nml    Left PronatorTeres Median C6-7 Nml Nml Nml Nml Nml 0 Nml Nml    Left BrachioRad Radial C5-6 Nml Nml Nml Nml Nml 0 Nml Nml    Left 1stDorInt Ulnar C8-T1 Nml Nml Nml Nml Nml 0 Nml Nml    Left Abd Poll Brev Median C8-T1 Nml Nml Nml Nml Nml 0 Nml Nml        Nerve Conduction Studies  Anti Sensory Left/Right Comparison     Site L Lat (ms) R Lat (ms) L-R Lat (ms) L Amp (??V) R Amp (??V) L-R Amp (%) Site1 Site2 L Vel (m/s) R Vel (m/s) L-R Vel (m/s)   Median Anti Sensory (2nd Digit)  29.3??C   Wrist 6.2    23.6   Wrist 2nd Digit 23     Radial Anti Sensory (Base 1st Digit)  28.1??C   Wrist 3.1   22.8   Wrist Base 1st Digit      Ulnar Anti Sensory (5th Digit)  30??C   Wrist 4.2   23.3   Wrist 5th Digit 33       Motor Left/Right Comparison     Site L Lat (ms) R Lat (ms) L-R Lat (ms) L Amp (mV) R Amp (mV) L-R Amp (%) Site1 Site2 L Vel (m/s) R Vel (m/s) L-R Vel (m/s)   Median Motor (Abd Poll Brev)  29.8??C   Wrist 6.5   2.5   Elbow Wrist 52     Elbow 9.8   2.8          Ulnar Motor (Abd Dig Minimi)  28.1??C   Wrist 3.6   8.6   B Elbow Wrist 53     B Elbow 6.8   7.9   A Elbow B Elbow 46     A Elbow 9.2   7.2                Waveforms:                   Most studies conclude a cutoff of 9 to 65mm2 as an indicator for carpal tunnel syndrome.

## 2016-02-19 ENCOUNTER — Ambulatory Visit: Admit: 2016-02-19 | Discharge: 2016-02-19 | Payer: Worker's Compensation

## 2016-02-19 DIAGNOSIS — G5602 Carpal tunnel syndrome, left upper limb: Secondary | ICD-10-CM

## 2016-02-19 NOTE — Assessment & Plan Note (Signed)
[facility] AND SPORTS MEDICINE     PATIENT NAME:  [name], [name]                  MRN:  [id number]  DATE OF BIRTH:  Jun 08, 1945                       CSN:  [id number]  PROVIDER:  Kallie Edward, M.D.                  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  On November 13, 2015, she underwent open reduction and  internal fixation of her distal radius.  She is playing her violin but after  a period of time she develops numbness and tingling in her fingers where they  go crazy.  She also reports night symptoms.  Negative for any diabetes,  thyroid disease, or cervical rheumatoid arthritis.  She is here for EMG  results.     PHYSICAL EXAMINATION: Positive carpal compression at 7 seconds.  No thenar  atrophy.  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  There  is 5/5 thenar strength.  Carpal compression test is positive at 7 seconds,  negative Tinel's.  Two-point discrimination to the left: Thumb 6 mm, index  finger 6 mm, middle finger 5 mm, small finger 6 mm.  Range of motion for left  wrist, 55/55.  She has pain with supination although she really has full  supination and pronation.  She has tenderness, dorsal pain, over the DRUJ.  No gross crepitus with range of motion of the DRUJ.     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     IMPRESSION: A 70 year old right-hand-dominant female with left carpal tunnel  syndrome.     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  At this time discussed that despite her  dorsal pain would  not recommend surgical treatment given her profession.  She does not have any  ulnar nerve symptoms currently and denies any at night with the fifth finger  so will not release the Guyon's canal.     The patient was seen with Dr. Venetia Maxon.  She will be scheduled for this at her  convenience.     Dictated by Alveda Reasons, M.D.                                              Kallie Edward, M.D.  PJS/ja  D:  02/19/2016 16:51  T:  02/23/2016 11:22  Job #:  [id number]           OFFICE NOTE  PAGE    1 of   1

## 2016-02-19 NOTE — Progress Notes (Signed)
This office note has been dictated.    I saw and evaluated the patient, and discussed with the resident. I agree with the resident???s findings and plan as documented in the resident???s note.

## 2016-02-19 NOTE — Assessment & Plan Note (Signed)
[facility] AND SPORTS MEDICINE     PATIENT NAME:  [name], [name]                  MRN:  [id number]  DATE OF BIRTH:  06-18-1945                       CSN:  [id number]  PROVIDER:  Kallie Edward, M.D.                  VISIT DATE:  02/19/2016                                       OFFICE NOTE     ADDENDUM--02/19/2016--ja     PLAN: Risks and benefits of surgery were discussed including but not limited  to incomplete symptom relief which she is at risk for given her history of  trauma in addition to compression, nerve injury and infection.  All questions  were answered.  She can be scheduled for this at her convenience.     Dictated by Alveda Reasons, M.D.                                              Kallie Edward, M.D.  PJS/ja  D:  02/19/2016 16:54  T:  02/23/2016 11:32  Job #:  [id number]           OFFICE NOTE                                                  PAGE    1 of   1

## 2016-02-19 NOTE — Progress Notes (Signed)
Faxed EMG results to [facility] @ [phone].  Received confirmation 9/15 3:16 pm

## 2016-02-29 NOTE — Progress Notes (Signed)
Submitted C9 for additional condition of (L) CTS G56.02, attached 9/15 office note. Faxed to [facility] @ [phone].  Received confirmation 9/25 9:08 am

## 2016-03-03 NOTE — Progress Notes (Signed)
Received notification that request for additional condition has been forwarded to [facility] for determination.

## 2016-03-07 ENCOUNTER — Encounter

## 2016-03-07 ENCOUNTER — Ambulatory Visit: Admit: 2016-03-07 | Payer: PRIVATE HEALTH INSURANCE

## 2016-03-07 ENCOUNTER — Ambulatory Visit: Admit: 2016-03-07 | Discharge: 2016-03-07 | Payer: PRIVATE HEALTH INSURANCE

## 2016-03-07 DIAGNOSIS — M7121 Synovial cyst of popliteal space [Baker], right knee: Secondary | ICD-10-CM

## 2016-03-07 DIAGNOSIS — L723 Sebaceous cyst: Secondary | ICD-10-CM

## 2016-03-07 MED ORDER — doxycyclineVIBRATABS100MGtablet
100 | ORAL_TABLET | ORAL | 0 refills | 30.00000 days | Status: AC
Start: 2016-03-07 — End: 2019-06-25

## 2016-03-07 NOTE — Procedures (Signed)
Skin prepped and draped in standard surgical fashion  Skin over area of maximal fluctuance infiltrated with 1% lidocaine  inicsion created with 11 blade  Large amount of purulence and sebum evacuated.   Cavity probed with hemostat to open up all loculations.  Irrigated   Wicked cavity with 1/2 inch strip gauze.     I was present for the entire procedure    INstructions given to patient regarding removal of guaze and care of vwound    Follow up in 1 week.    Finish ABX ax prescribed by PCP    Bertram Savin

## 2016-03-07 NOTE — Progress Notes (Signed)
[facility]  [facility]  [address] [place] [address]    Name:  TRIS FRATUS Date of Birth: 1946-01-03 (70 y.o.)   MRN: [id number]    Date of Service:  03/07/2016    Subjective   Chief Complaint:     Chief Complaint   Patient presents with    Knee Pain     mass on back of knee- become very sore last week        History of Present Illness:   ATYANA SUPINGER is a(n) 70 y.o. female here today for the following:       Pt. here today for an urgent visit. Med, problem and allergy list updated today.      Pt. Here due to cyst on back of knee.  Became painful last week.     Cyst has been present for a few years.  Thursday noted more sweling and redness.  Incerased pain        Current Outpatient Medications:  Current Outpatient Prescriptions   Medication Sig Dispense Refill    ibuprofen (ADVIL,MOTRIN) 200 MG tablet Take 200 mg by mouth daily as needed for Pain. Takes few times a week due to neck pain and occas bedtime       Current Facility-Administered Medications   Medication Dose Route Frequency Provider Last Rate Last Dose    bupivacaine (MARCAINE) 0.5 % (5 mg/mL) injection 10 mg  2 mL Subcutaneous Once [employer], [place]        lidocaine 10 mg/mL (1 %) injection 2 mL  2 mL Subcutaneous Once Marissa Lindsey Baum, [place]        triamcinolone acetonide (KENALOG-40) injection 80 mg  80 mg Intra-articular Once Duffy Bruce, [place]             ROS:   Review of Systems   All other systems reviewed and are negative.         Objective:     Vitals:    03/07/16 1031   BP: 116/74   BP Location: Left arm   Patient Position: Sitting   BP Cuff Size: Regular   Pulse: 93   Weight: 101 lb 6.4 oz (46 kg)     Body mass index is 16.87 kg/m.    Physical Exam   Musculoskeletal:        Legs:            Assessment/Plan:   There are no diagnoses linked to this encounter.  No Follow-up on file.       Fortino Sic, MD

## 2016-03-07 NOTE — Assessment & Plan Note (Signed)
Appears infected.  Will refe to surgery.   Start doxycycline 100mg  bid for 7 days.     Risks, benefits, indications and instructions have been discussed with pt.   Patient understands and agrees with plan.

## 2016-03-07 NOTE — Progress Notes (Signed)
Chief Complaint:    Chief Complaint   Patient presents with    New Patient Visit/ Consultation     mass, posterior RLE       Subjective   HPI:   Patient ID: [name] is a 70 y.o. female.  HPI     Patient presents today for new patient consultation and evaluation of mass posterior RLE    Allergies  Iodine; Iodine and iodide containing products; Iodinated contrast- oral and iv dye; and Shellfish containing products    Medications  Outpatient Encounter Prescriptions as of 03/07/2016   Medication Sig Dispense Refill    doxycycline (VIBRA-TABS) 100 MG tablet 1 capsule twice daily. 14 tablet 0    ibuprofen (ADVIL,MOTRIN) 200 MG tablet Take 200 mg by mouth daily as needed for Pain. Takes few times a week due to neck pain and occas bedtime      alendronate (FOSAMAX) 70 MG tablet        Facility-Administered Encounter Medications as of 03/07/2016   Medication Dose Route Frequency Provider Last Rate Last Dose    bupivacaine (MARCAINE) 0.5 % (5 mg/mL) injection 10 mg  2 mL Subcutaneous Once [employer], [place]        lidocaine 10 mg/mL (1 %) injection 2 mL  2 mL Subcutaneous Once Marissa Lindsey Baum, [place]        triamcinolone acetonide (KENALOG-40) injection 80 mg  80 mg Intra-articular Once Duffy Bruce, [place]            Histories  She has a past medical history of History of fall; Low back pain; Osteoarthritis; Osteoporosis; Urticaria; and Varicella.    She has a past surgical history that includes Cesarean section (1985); Leg Surgery (05/09/06); Hardware Removal (8/09); and Wrist fracture surgery (Left, 11/13/2015).    Her family history includes Stroke in her mother.    She reports that she has never smoked. She has never used smokeless tobacco. She reports that she drinks about 4.2 oz of alcohol per week . She reports that she does not use drugs.    The following portions of the patient's history were reviewed and updated as appropriate: allergies, current medications, past family history, past medical  history, past social history, past surgical history and problem list.    ROS:   Review of Systems   Constitutional: Negative for activity change, appetite change, chills, diaphoresis, fatigue and fever.   HENT: Negative for congestion, dental problem, drooling, ear discharge, ear pain, facial swelling, hearing loss, mouth sores, nosebleeds, postnasal drip, rhinorrhea, sinus pressure, sneezing, sore throat, tinnitus, trouble swallowing and voice change.    Eyes: Negative for photophobia, pain, discharge, redness, itching and visual disturbance.   Respiratory: Negative for apnea, cough, choking, chest tightness, shortness of breath, wheezing and stridor.    Cardiovascular: Negative for chest pain, palpitations and leg swelling.   Gastrointestinal: Negative for abdominal distention, abdominal pain, anal bleeding, bloating, blood in stool, constipation, diarrhea, heartburn, nausea, rectal pain and vomiting.   Genitourinary: Negative for decreased urine volume, difficulty urinating, dyspareunia, dysuria, enuresis, flank pain, frequency, vaginal bleeding, vaginal discharge and vaginal pain.   Musculoskeletal: Positive for arthralgias and gait problem. Negative for back pain, joint swelling, myalgias, neck pain and neck stiffness.   Skin: Positive for wound. Negative for color change, pallor and rash.   Neurological: Negative for dizziness, tremors, seizures, syncope, facial asymmetry, speech difficulty, weakness, light-headedness, numbness and headaches.   Hematological: Negative for adenopathy. Does not bruise/bleed easily.  Psychiatric/Behavioral: Negative for agitation, behavioral problems, confusion, decreased concentration, depression, dysphoric mood, hallucinations, self-injury, sleep disturbance and suicidal ideas. The patient is not nervous/anxious and is not hyperactive.        Objective:   Physical Exam   Constitutional: She is oriented to person, place, and time. She appears well-developed and well-nourished.  No distress.   HENT:   Head: Normocephalic and atraumatic.   Eyes: Right eye exhibits no discharge. Left eye exhibits no discharge. No scleral icterus.   Cardiovascular: Normal rate and regular rhythm.    Pulmonary/Chest: Effort normal. No respiratory distress.   Musculoskeletal:        Arms:       Legs:  Neurological: She is alert and oriented to person, place, and time.   Skin: Skin is warm and dry. She is not diaphoretic.   Psychiatric: She has a normal mood and affect. Her behavior is normal.          Assessment/Plan:   No diagnosis found.    Infected sebaceous cyst  Location odd for Baker's cyst  Plan for I and D    Discussed with patient    Bertram Savin

## 2016-03-07 NOTE — Patient Instructions (Signed)
Take antibiiotic as prescribed  Follow up with surgery later today.

## 2016-03-14 ENCOUNTER — Ambulatory Visit: Admit: 2016-03-14 | Discharge: 2016-03-14 | Payer: PRIVATE HEALTH INSURANCE

## 2016-03-14 DIAGNOSIS — L089 Local infection of the skin and subcutaneous tissue, unspecified: Secondary | ICD-10-CM

## 2016-03-14 NOTE — Progress Notes (Signed)
Chief Complaint:    Chief Complaint   Patient presents with    Post-op Evaluation     03/07/16-I & D abscess RLE       Subjective   HPI:   Patient ID: [name] is a 70 y.o. female.  HPI  Post-Op Visit:    Date of surgery:  10.2.17  Procedure:  I & D abscess RLE    Complaints:  tenderness  Imaging:  None at this time  Wound/Incision:  serous drainage  Sutures/Staples:  N/A - previously removed  Dressing:  reapplied  Instructions Given:  wound care instructions  Restrictions:  N/A - no restrictions necessary         Allergies  Iodine; Iodine and iodide containing products; Iodinated contrast- oral and iv dye; and Shellfish containing products    Medications  Outpatient Encounter Prescriptions as of 03/14/2016   Medication Sig Dispense Refill    alendronate (FOSAMAX) 70 MG tablet       doxycycline (VIBRA-TABS) 100 MG tablet 1 capsule twice daily. 14 tablet 0    ibuprofen (ADVIL,MOTRIN) 200 MG tablet Take 200 mg by mouth daily as needed for Pain. Takes few times a week due to neck pain and occas bedtime       Facility-Administered Encounter Medications as of 03/14/2016   Medication Dose Route Frequency Provider Last Rate Last Dose    bupivacaine (MARCAINE) 0.5 % (5 mg/mL) injection 10 mg  2 mL Subcutaneous Once [employer], [place]        lidocaine 10 mg/mL (1 %) injection 2 mL  2 mL Subcutaneous Once Marissa Lindsey Baum, [place]        triamcinolone acetonide (KENALOG-40) injection 80 mg  80 mg Intra-articular Once Duffy Bruce, [place]            Histories  She has a past medical history of History of fall; Low back pain; Osteoarthritis; Osteoporosis; Urticaria; and Varicella.    She has a past surgical history that includes Cesarean section (1985); Leg Surgery (05/09/06); Hardware Removal (8/09); and Wrist fracture surgery (Left, 11/13/2015).    Her family history includes Stroke in her mother.    She reports that she has never smoked. She has never used smokeless tobacco. She reports that she drinks about  4.2 oz of alcohol per week . She reports that she does not use drugs.    The following portions of the patient's history were reviewed and updated as appropriate: allergies, current medications, past family history, past medical history, past social history, past surgical history and problem list.    ROS:   Review of Systems    Objective:   Physical Exam    Right posterior distal thigh cyst s/p- i and d,   Healing   Significant decrease in induration and erythema    Still with small amount of drainage.      Assessment/Plan:   No diagnosis found.    Recommend completion of abx  Soap and water to wound  Daily    Plan for complete exicision in 4 weeks    offerred both in office (without sedation) and in OR with local mac.    Patient to consider and call to schedule.    Mellody Drown P Debbora Ang

## 2016-03-15 NOTE — Progress Notes (Signed)
G56.02 now in allow/appeal status  Submitted C9 for (L) carpal tunnel release ([id number]) Faxed to [facility] @ [phone].  Received confirmation 10/10 3:05 pm

## 2016-03-16 NOTE — Progress Notes (Signed)
Received approval with disclaimer (additional condition is pending) for surgery, approved 03/15/16-04/15/16. Auth # [id number].Given to Sarah for scheduling.

## 2016-03-21 NOTE — Unmapped (Signed)
Spoke with Pt arrival given for 830am. No Questions or Concerns

## 2016-03-22 MED ORDER — bupivacaine (PF) (SENSORCAINE/MARCAINE) 0.5 % (5 mg/mL) 5 mL, lidocaine (PF) (XYLOCAINE) 10 mg/mL (1 %) 5 mL 10 mL injection
10 | INTRAMUSCULAR | Status: AC | PRN
Start: 2016-03-22 — End: 2016-03-22
  Administered 2016-03-22: 14:00:00 7

## 2016-03-22 MED ORDER — sodium chloride, irrigation 0.9 % irrigation
0.9 | Status: AC | PRN
Start: 2016-03-22 — End: 2016-03-22
  Administered 2016-03-22: 15:00:00 500

## 2016-03-22 MED ORDER — HYDROcodone-acetaminophen (NORCO) 5-325 mg per tablet
5-325 | ORAL_TABLET | Freq: Four times a day (QID) | ORAL | 0 refills | 15.50000 days | Status: AC | PRN
Start: 2016-03-22 — End: 2016-04-18

## 2016-03-22 NOTE — Brief Op Note (Signed)
LEFT CARPAL TUNNEL RELEASE  Procedure Note    [name]  03/22/2016      Pre-op Diagnosis: Carpal tunnel syndrome on left [G56.02]       Post-op Diagnosis: same    Procedure(s):  LEFT CARPAL TUNNEL RELEASE      Surgeon(s):  Kathrene Bongo, MD  Lonell Grandchild, MD    Anesthesia: Local    Staff:   Circulator: Duke Salvia, RN  Scrub Person: Merry Lofty, ST  2nd Circulator: Darreld Mclean, RN  Float: Milana Huntsman, RN  Resident: Mady Haagensen, MD    Estimated Blood Loss: Minimal                 Specimens:            Drains:        There were no complications unless listed below.        Jeriann Sayres ANN Ashaya Raftery     Date: 03/22/2016  Time: 10:32 AM

## 2016-03-22 NOTE — Unmapped (Signed)
ORTHOPAEDICS AND SPORTS MEDICINE         HAND SURGERY - Peter Stern, MD     POST-OPERATIVE DISCHARGE INSTRUCTIONS    ?? PRESCRIPTIONS:  ?? You have been given a prescription to be taken as directed for post-operative pain control.  ?? Take over-the-counter Colace, 100mg by mouth twice a day while taking narcotic pain medications to help prevent constipation.    ?? You may also take over the counter ibuprofen/aleve and tylenol for pain. Take this as directed on the packaging. Do not exceed 3000 mg tylenol/acetaminophen in 24 hours.     Ibuprofen 600-800 mg (3-4) tablets by mouth every 6 hours as needed for pain.      OR   Aleve 2 tablets by mouth every 12 hours (twice daily) as needed for pain.      AND/OR   Tylenol 1000 mg (2 tablets) every 8 hours as needed for pain.    ?? Please use your pain medication carefully, as refills are limited and you may not be provided with one.     ?? Prescriptions are only filled in person during a clinic visit.     ?? As stated above, please  use over the counter pain medicine - it will also be helpful with decreasing your swelling.       ANESTHESIA:  After your surgery, post-surgical discomfort or pain is likely. This discomfort can last several days to a few weeks. At certain times of the day your discomfort may be more intense.   Did you receive a nerve block?   A nerve block can provide pain relief for one hour to two days after your surgery. As long as the nerve block is working, you will experience little or no sensation in the area the surgeon operated on.   As the nerve block wears off, you will begin to experience pain or discomfort. It is very important that you begin taking your prescribed pain medication before the nerve block fully wears off. Treating your pain at the first sign of the block wearing off will ensure your pain is better controlled and more tolerable when full-sensation returns. Do not wait until the pain is intolerable, as the medicine will be less  effective. It is better to treat pain in advance than to try and catch up.   General Anesthesia:   If you did not receive a nerve block during your surgery, you will need to start taking your pain medication shortly after your surgery and should continue to do so as prescribed by your surgeon.      ICE AND ELEVATION:     ?? You may use ice for the first 48-72 hours, but it is not critical.      ?? Motion of your fingers is very important s to decrease the swelling. Please follow the finger range of motion exercises below to assist you in regaining your motion. This should be done at least 10 repetitions 3-4 times a day.     ?? Elevation, as much as possible for the next 48 hours, is critical for decreasing swelling as well as for pain relief. Elevation means when you are seated or lying down, you hand should be at or above your heart. When walking, the hand needs to be at or above the level of your elbow.     ?? If the bandage gets too tight, it may need to be loosened. Please contact our office and we will instruct you in how   to do this.     SURGICAL BANDAGES:     ?? Keep your dressing and/or splint clean and dry at all times.  You can remove your dressing on post-operative day 3 and change with a dry dressing or Band-Aids as needed thereafter.    ?? You may place a plastic bag over your bandage to shower, but be careful, do not get your bandages wet.   ?? After the bandages have been removed, it is OK to get the stitches wet in a shower or with hand washing. Do Not soak or submerge the wound yet. Please do not use lotions or creams on the stitches.       HAND THERAPY:     ?? You may not need any. If you do, we will begin this at your follow up visit in the clinic.    ACTIVITY AND WORK:     ?? You are encouraged to move any fingers which are not in the bandage. Attached is an instruction sheet on finger motion. Do this as much as you need to make your fingers move fully and keep the swelling down.     ?? Light use of the  fingers is allowed to assist the other hand with daily hygiene and eating, but strong gripping or lifting is often uncomfortable and should be avoided.     ?? You might miss a variable period of time from work and hopefully this issue has been discussed prior to surgery. You may not do any heavy work with your affected hand for about 4 weeks.     ?? Please share this information with your employer. Please bring any forms that need to be filled out to your office visit or contact Lauris Chroman in our office for assistance at [phone]. Our fax number is [phone].     SCAR:     ?? Please DO NOT apply any lotions or creams on your scar until you have been instructed to do so by Dr. Venetia Maxon or your Hand Therapist.     ?? The scar will continue to heal for up to 1 year and will become much less visible and tender. The tenderness can be improved by rubbing the scar gently and using lotion after the stitches have been removed.      CONCERNS:    ?? If your discomfort or swelling increases, after initially getting better in the first few days, infection may be a concern. Normal time for infection is the fourth or fifth day.     ?? If you experience symptoms that you are concerned about, please contact our office at [phone].     ?? You may need to be seen by Dr. Venetia Maxon or another provider.     ?? For after hour EMERGENCIES please call [phone] and ask for the Orthopaedic Physician on call.     FOLLOW-UP APPOINTMENT:     ?? If your post-operative appointment has not already been arranged, Please call [phone] to make the appointment to see Dr. Venetia Maxon in about 2 weeks

## 2016-03-22 NOTE — Op Note (Signed)
DATE OF SURGERY:03/22/2016    PREOPERATIVE DIAGNOSIS:   Left carpal tunnel syndrome    POSTOPERATIVE DIAGNOSIS: same    PROCEDURE:  Left carpal tunnel release (64403).    SURGEON: Surgeon(s) and Role:     * Kathrene Bongo, MD - Primary     * Lonell Grandchild, MD - Fellow    ANESTHESIA:  Local with Sedation    TOURNIQUET TIME:* Missing tourniquet times found for documented tourniquets in log:  [id number] *.    BLOOD LOSS: Minimal.    COMPLICATIONS: None.    PATHOLOGY: None.    TIME OUT: A time out was performed before the procedure started.    INDICATIONS: The patient is a 70 y.o. -year-old female who presented with carpal tunnel syndrome failing nonsurgical management, indicated for surgical release.    DESCRIPTION OF PROCEDURE: Supine position, well padded forearm tourniquet. Hand was exsanguinated, tourniquet inflated to 250 mmHg.  Local anesthesia with 0.5% marcaine and 1% lidocaine, Hibiclens and alcohol Prep, and sterile drape.    A 2 cm palmar incision was made about 5 mm ulnar to the thenar crease. Palmar aponeurosis was exposed and divided in line with the skin incision. The palmaris brevis was visualized and divided. Proximal edge of the transcarpal ligament was identified. The transcarpal ligament was released under direct visualization. Then the distal portion of the antebrachial fascia was released. All fibrous bands were released distally. Median nerve was visualized, fat pad was exposed. Tourniquet was deflated. Hemostasis achieved. Wound was irrigated and closed with 4-0 nylon sutures. Sterile dressing applied. The patient was transferred to the recovery room in stable condition after all counts were correct.    POSTOPERATIVE PLAN: Finger and wrist motion as tolerated, no heavy lifting for 4 weeks.       Alcide Evener, MD  Orthopaedic Surgery PGY-3  03/22/2016  Pager: [phone]  I was the attending surgeon and was present and/or scrubbed for all key and critical portions of the procedure.      Kallie Edward, MD

## 2016-03-22 NOTE — Other (Signed)
INTRA-OP POST BRIEFING NOTE: [name]      Specimens:     Prior to leaving the room: Nurse confirmed name of procedure, completion of instrument, sponge & needle counts, reads specimen labels aloud including patient name and addresses any equipment issues? Nurse confirmed wound class. Nurse to surgeon and anesthesia: What are key concerns for recovery and management of the patient?  Yes      Blood products stored at appropriate temperatures prior to return to blood bank (if applicable)? N/A      Patient identification band secured on patient prior to transfer out of the operating room? Yes      Other Comments:     Signed: Duke Salvia    Date: 03/22/2016    Time: 10:43 AM

## 2016-03-22 NOTE — H&P (Signed)
[facility]    [name]  03/22/2016     CHIEF COMPLAINT     No chief complaint on file.       HISTORY OF PRESENT ILLNESS     [name] is a 70 Y F, presents for scheduled left carpal tunnel release.      MEDICAL HISTORY   Allergies:  Allergies   Allergen Reactions    Iodine Anaphylaxis     IVP dye for a kidney function test greater than 20 years ago    Iodine And Iodide Containing Products Anaphylaxis     Respiratory Distress    Iodinated Contrast- Oral And Iv Dye     Shellfish Containing Products        Past Medical History:  Past Medical History:   Diagnosis Date    History of fall     fell 10/2015    Low back pain     Osteoarthritis     Osteoporosis     Urticaria     Varicella        Current Medications:  Reviewed in EPIC    Past Surgical History:   Past Surgical History:   Procedure Laterality Date    CESAREAN SECTION  1985    HARDWARE REMOVAL  8/09    of left tibia plateau    LEG SURGERY  05/09/06    fractured left tibia plateau    WRIST FRACTURE SURGERY Left 11/13/2015    Procedure: OPEN REDUCTION INTERNAL FIXATION LEFT DISTAL RADIUS FRACTURE;  Surgeon: Kathrene Bongo, MD;  Location: [facility];  Service: Orthopedics;  Laterality: Left;       Social History:   Social History     Social History    Marital status: Married     Spouse name: N/A    Number of children: N/A    Years of education: N/A     Social History Main Topics    Smoking status: Never Smoker    Smokeless tobacco: Never Used    Alcohol use 4.2 oz/week     7 Cans of beer per week    Drug use: No      Comment: 12-23-2009    Sexual activity: Yes     Partners: Male     Other Topics Concern    None     Social History Narrative    None       Family History:  Family History   Problem Relation Age of Onset    Stroke Mother     Melanoma Neg Hx         REVIEW OF SYSTEMS     Constitutional:  Denies fever, chills or weight loss   HEENT:  Denies sore throat or acute eye problems   Respiratory:  Denies cough  or shortness of breath   Cardiovascular:  Denies chest pain or palpitations  GI:  Denies abdominal pain, nausea, vomiting, or diarrhea   Neurologic:  Denies headache, focal weakness or sensory changes   Musculoskeletal:  As described in HPI     PHYSICAL EXAMINATION     General: Well-nourished, no acute distress, normal affect, conversant and oriented  CV: regular rate  Pulm: Unlabored breaths on RA.   MSK: fingers warm and well perfused.     IMAGING    none     IMPRESSIONS   EMERAL RHIM is a 70 y.o. female who presents with the following diagnoses:  Left carpal tunnel    PLAN  Proceed for left carpal tunnel release.     Aalyah Mansouri NICOLE Chaney Malling, MD

## 2016-03-28 NOTE — Progress Notes (Signed)
Faxed op report to [facility] @ [phone]. Advised of follow up scheduled 04/05/16.  Received confirmation 10/23 2:40 pm

## 2016-04-05 ENCOUNTER — Ambulatory Visit: Admit: 2016-04-05 | Discharge: 2016-04-05 | Payer: Worker's Compensation

## 2016-04-05 DIAGNOSIS — G5602 Carpal tunnel syndrome, left upper limb: Secondary | ICD-10-CM

## 2016-04-05 NOTE — Progress Notes (Signed)
Sutures were removed per the doctor's instruction.   Patient tolerated this well with no complications.   Steri strips were applied.   Patient was given instructions regarding showering/bathing.

## 2016-04-05 NOTE — Unmapped (Signed)
This office note has been dictated. [facility].

## 2016-04-05 NOTE — Assessment & Plan Note (Signed)
[facility]IC[facility]     PATIENT NAME:  [name], [name]                  MRN:  [id number]  DATE OF BIRTH:  [birth year]                       CSN:  [id number]  PROVIDER:  Kallie Edward, M.D.                  VISIT DATE:  04/05/2016                                       OFFICE NOTE     REASON FOR VISIT:  Follow-up carpal tunnel syndrome.     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 Overall,  she reports resolution of her paresthesias. She has been playing with some  improvement. She was initially frustrated but this has improved over the past  couple of days.  She reports her main concern is over the ulnar aspect of her  wrist and her prominent distal ulna.  Reports her main pain is when she is in  excessive pronation and trying to reach the high keys with her violin.     PHYSICAL EXAMINATION: In general, the patient is alert and oriented and in no  acute distress.  Left upper extremity +2 radial pulses. Sensation is intact  to light touch median, radial and ulnar nerve distributions.  She has known  swelling and prominence of the thumb carpal metacarpal joint with adduction  contracture. Wrist range of motion is 65/75.  5/5 strength with thenar  testing. Carpal tunnel incision is well healed. She has full pronation and  approximately 70-75 degrees of supination.  No crepitus with finger flexion  or FPL flexion.  Her EPL is intact.     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     PLAN: Discussed desensitization and massage techniques. At this point, we  will have her come back in 1 month for further discussion of possible plate  removal versus other procedures for her ulnar sided wrist pain. Discussed  that surgery may or may not  significantly improve her ability to return to  full violin playing.  Discussed that she can return to playing without any  restrictions.  She is going to see how this goes and we will see her back in  1 month. All questions were answered. Further discussion of treatment options  with Dr. Venetia Maxon.     Dictated by: Alveda Reasons, MD                                              Kallie Edward, M.D.  PJS/pd  D:  04/05/2016 13:48  T:  04/06/2016 16:21  Job #:  [id number]           OFFICE NOTE  PAGE    1 of   1

## 2016-04-12 NOTE — Progress Notes (Signed)
Submitted [employer] 14- attached 10/31 office note. Advised of follow up scheduled 05/10/16. Faxed to [facility] @ [phone]  Received confirmation 11/7 10:07 am

## 2016-04-18 ENCOUNTER — Ambulatory Visit: Admit: 2016-04-18 | Discharge: 2016-04-18 | Payer: PRIVATE HEALTH INSURANCE

## 2016-04-18 DIAGNOSIS — IMO0002 Reserved for concepts with insufficient information to code with codable children: Secondary | ICD-10-CM

## 2016-04-18 NOTE — Procedures (Signed)
Area prepped with betadine  Draped in standard fashion  Anesthetized with 1% lidocaine  eliptical incision created to include previous incision site   Dissection carried down through subcutaneous tissue to include entire lesion.  Removed  Closed in layers with 3-0 vicryl deep dermal followed by 3-0 vicryl subcuticular   Dressed witn dermabond  i was present for entire procedure.    Mellody Drown P Alaa Mullally

## 2016-05-02 ENCOUNTER — Ambulatory Visit: Admit: 2016-05-02 | Discharge: 2016-05-02 | Payer: PRIVATE HEALTH INSURANCE

## 2016-05-02 DIAGNOSIS — M7121 Synovial cyst of popliteal space [Baker], right knee: Secondary | ICD-10-CM

## 2016-05-02 NOTE — Unmapped (Signed)
Chief Complaint:    Chief Complaint   Patient presents with   ??? Establish Care     2 week f.u excision soft tissue RLE       Subjective   HPI:   Patient ID: [name] is a 70 y.o. female.  HPI     Patient presents today for follow up of excision soft tissue RLE    Allergies  Iodine; Iodine and iodide containing products; Iodinated contrast- oral and iv dye; and Shellfish containing products    Medications  Outpatient Encounter Prescriptions as of 05/02/2016   Medication Sig Dispense Refill   ??? alendronate (FOSAMAX) 70 MG tablet      ??? alendronate (FOSAMAX) 70 MG tablet TAKE 1 TAB BY MOUTH EVERY 7 DAYS.     ??? doxycycline (VIBRA-TABS) 100 MG tablet 1 capsule twice daily. 14 tablet 0   ??? ibuprofen (ADVIL,MOTRIN) 200 MG tablet Take 200 mg by mouth daily as needed for Pain. Takes few times a week due to neck pain and occas bedtime       Facility-Administered Encounter Medications as of 05/02/2016   Medication Dose Route Frequency Provider Last Rate Last Dose   ??? bupivacaine (MARCAINE) 0.5 % (5 mg/mL) injection 10 mg  2 mL Subcutaneous Once Duffy Bruce, [place]       ??? lidocaine 10 mg/mL (1 %) injection 2 mL  2 mL Subcutaneous Once Marissa Lindsey Baum, [place]       ??? triamcinolone acetonide (KENALOG-40) injection 80 mg  80 mg Intra-articular Once Duffy Bruce, [place]            Histories  She has a past medical history of History of fall; Low back pain; Osteoarthritis; Osteoporosis; Urticaria; and Varicella.    She has a past surgical history that includes Cesarean section (1985); Leg Surgery (05/09/06); Hardware Removal (8/09); Wrist fracture surgery (Left, 11/13/2015); and release carpal tunnel (Left, 03/22/2016).    Her family history includes Stroke in her mother.    She reports that she has never smoked. She has never used smokeless tobacco. She reports that she drinks about 4.2 oz of alcohol per week . She reports that she does not use drugs.    The following portions of the patient's history were reviewed  and updated as appropriate: allergies, current medications, past family history, past medical history, past social history, past surgical history and problem list.    ROS:   Review of Systems   Constitutional: Negative for activity change, appetite change, chills, diaphoresis, fatigue, fever, weight gain and weight loss.   Respiratory: Negative for apnea, cough, choking, chest tightness, shortness of breath, wheezing and stridor.    Cardiovascular: Negative for chest pain, palpitations and leg swelling.   Gastrointestinal: Negative for abdominal distention, abdominal pain, anal bleeding, bloating, blood in stool, constipation, diarrhea, heartburn, nausea, rectal pain and vomiting.   Skin: Positive for wound.       Objective:   Physical Exam   induration at incision site,  No signs of infection     Assessment/Plan:   No diagnosis found.    Indurated and firm from surgical healing  Suspect it will resolve in 6 weeks.    Follow up at that time if it persists.    [name]

## 2016-05-10 ENCOUNTER — Ambulatory Visit: Admit: 2016-05-10 | Discharge: 2016-05-10 | Payer: Worker's Compensation

## 2016-05-10 ENCOUNTER — Inpatient Hospital Stay: Admit: 2016-05-10 | Payer: Worker's Compensation

## 2016-05-10 DIAGNOSIS — M1812 Unilateral primary osteoarthritis of first carpometacarpal joint, left hand: Secondary | ICD-10-CM

## 2016-05-10 DIAGNOSIS — M25532 Pain in left wrist: Secondary | ICD-10-CM

## 2016-05-10 NOTE — Assessment & Plan Note (Signed)
[facility] AND SPORTS MEDICINE     PATIENT NAME:  [name], [name]                  MRN:  [id number]  DATE OF BIRTH:  [birth year]                       CSN:  [id number]  PROVIDER:  Kallie Edward, M.D.                  VISIT DATE:  05/10/2016                                       OFFICE NOTE     This is a follow-up.     The patient is well-known with Dr. Venetia Maxon. She is a 70 year old female who  underwent left distal radius fracture back in June of 2017. She did well  post-operatively but developed a carpal tunnel syndrome which she underwent  release of her carpal tunnel on the left on March 22, 2016. She reports  complete resolution of all of her paresthesias of her left upper extremity.  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  She does report some pain in her left wrist with excessive  supination in trying to reach the high keys with her violin.  She also  complains of prominent ulnar head in her left wrist.     PHYSICAL EXAMINATION: No acute distress. Answers questions appropriately.  Examination of the left upper extremity, her sensation is intact to light  touch median, ulnar and radial nerve distributions. No skin defects or  swelling appreciated in her wrist. She does have a prominent ulnar head but  no DRUJ instability. Her wrist range of motion is 65-75 degrees and she has  full supination and pronation.     IMAGING: Two views of the left wrist obtained and reviewed demonstrating  ulnar positive of approximately 2 mm, volar plate in place in her distal  radius.     IMPRESSION AND PLAN: A 70 year old concert violinist status post ORIF of  distal radius in June of 2017 and carpal tunnel release in October of 2017.  Doing well from the carpal tunnel release with ulnar-sided ulnar positive.     Discussed that we are happy with her relief of the carpal tunnel release. We  also discussed that  her difficulty with excessive supination and a prominent  ulnar head is likely due to radial shortening during fracture healing. We  explained that surgical repair of this would include either a Darrach  procedure or an ulnar shortening osteotomy. These are rather large surgeries  and we are unsure if we can completely make the patient better from these.  Due to her being a concert violinist and very minimal pain except for when  playing these high notes, we have recommended continue to monitor her left  wrist pain.  The patient has demonstrated understanding and agrees with  conservative management with monitoring of her left wrist discomfort. We  would like to see the patient back in approximately three or four months to  continue to follow this ulnar positive wrist pain.     The patient was seen and examined  by Dr. Venetia Maxon.     Dictated by Wynona Meals, M.D.                                              Kallie Edward, M.D.  PJS/tlm  D:  05/10/2016 17:35  T:  05/13/2016 16:12  R:  05/14/2016 10:40 - akg  Job #:  [id number]           OFFICE NOTE                                                  PAGE    1 of   1

## 2016-05-10 NOTE — Unmapped (Signed)
This office note has been dictated.  I saw and examined the patient.  I discussed with the resident or fellow and agree with the resident's findings and plan as documented in the note.    Kallie Edward, MD

## 2016-05-17 NOTE — Progress Notes (Signed)
Faxed 12/5 office note to [facility] @ [phone]. No follow up scheduled at this time.  Received confirmation 12/12 10:00 am

## 2016-06-06 HISTORY — PX: LOOP RECORDER INSERTION: EP1214

## 2016-09-29 ENCOUNTER — Inpatient Hospital Stay: Admit: 2016-09-29 | Payer: PRIVATE HEALTH INSURANCE

## 2016-09-29 ENCOUNTER — Ambulatory Visit: Admit: 2016-09-29 | Discharge: 2016-09-29 | Payer: PRIVATE HEALTH INSURANCE

## 2016-09-29 DIAGNOSIS — R52 Pain, unspecified: Secondary | ICD-10-CM

## 2016-09-29 DIAGNOSIS — M170 Bilateral primary osteoarthritis of knee: Secondary | ICD-10-CM

## 2016-09-29 MED ORDER — bupivacaine (MARCAINE) 0.5 % (5 mg/mL) injection 10 mg
0.5 | Freq: Once | INTRAMUSCULAR | Status: AC
Start: 2016-09-29 — End: 2016-09-29
  Administered 2016-09-29: 15:00:00 10 mL via SUBCUTANEOUS

## 2016-09-29 MED ORDER — lidocaine 10 mg/mL (1 %) injection 2 mL
10 | Freq: Once | INTRAMUSCULAR | Status: AC
Start: 2016-09-29 — End: 2016-09-29
  Administered 2016-09-29: 15:00:00 2 mL via SUBCUTANEOUS

## 2016-09-29 MED ORDER — triamcinolone acetonide (KENALOG-40) injection 80 mg
40 | Freq: Once | INTRAMUSCULAR | Status: AC
Start: 2016-09-29 — End: 2016-09-29
  Administered 2016-09-29: 15:00:00 80 mg

## 2016-09-29 NOTE — Progress Notes (Signed)
A verbal consent was obtained and a verbal timeout was performed on 09/29/2016 at 8:52 AM.      Haywood Filler, MA

## 2016-09-29 NOTE — Unmapped (Signed)
This office note has been dictated.

## 2016-09-29 NOTE — Unmapped (Signed)
[facility] AND SPORTS MEDICINE     PATIENT NAME:  [name], [name]                  MRN:  [id number]  DATE OF BIRTH:  [birth year]                       CSN:  [id number]  PROVIDER:  Skipper Cliche, M.D.                VISIT DATE:  09/29/2016                                       OFFICE NOTE     REASON FOR VISIT:  Right knee pain.     HISTORY:  [name] is a patient well-known to Dr. Conley Rolls with a left tibial  plateau injury about 10 years ago.  She had subsequent removal of hardware  with a resultant compression deformity of the lateral compartment.  The  patient had an injection of that knee in February of 2016. She has actually  been doing quite well with that knee.  She presents today for evaluation of  her right knee.  She noticed some mild progression of pain of that right knee  and about 6 weeks ago had a significant flare of her pain.  She does not  recall any specific injury.  She had basically gone to the gym at the [facility]  and felt that she had maybe overdone it and she has been having some  intermittent pain since. She notes the knee does swell up on occasion.  All  the pain is located on the medial side of the knee. She has difficulties  ascending and descending stairs.  She has not had any treatment for this as  of yet.  The patient would like to consider an injection if possible.     PHYSICAL EXAMINATION:  The patient is a very thin, older female. She is in no  apparent distress, alert and oriented x 3, appears her stated age,  appropriate mood and affect.  Her right knee was compared to the left. She  does have a slight valgus deformity of the left knee and no significant  malalignment of the right. She has pain and tenderness coursing along the  medial compartment of the knee. She is nontender along the pes anserine  insertion.  She has a small effusion.  The patient is able to flex and extend  0-120.  Stable to varus and valgus  exam.  A negative McMurray's.  She has 5/5  strength with knee flexion and knee extension, negative flexion,  dorsiflexion, plantar flexion. Sensation is grossly normal in DP, SP, sural,  saphenous and tibial distribution.     X-rays were reviewed. She had bilateral standing knees illustrating a valgus  left knee with compression deformity of the lateral compartment and evidence  of a prior injury and removal of hardware.     She has some mild arthritis of the right knee with some medial compartment  narrowing and some notable osteophytes as well in the medial compartment.     ASSESSMENT: Bilateral knee osteoarthritis.     PLAN:  The patient would like to pursue a right knee injection.  This was  done by Dr. Conley Rolls without complication.  The patient is going to do activity as  tolerated and call us as needed.     Dictated by: Loney Laurence, P.A.                                              Skipper Cliche, M.D.  TTL/kh  D:  09/29/2016 08:40  T:  09/30/2016 15:24  Job #:  [id number]           OFFICE NOTE                                                  PAGE    1 of   1

## 2016-10-19 NOTE — Unmapped (Signed)
Subjective  Subjective:  Patient ID: [name]  Age: 71 y.o. (DOB: [birth year])  Ethnicity: Non-Hispanic  Race: White or Caucasian    OB History   Gravida Para Term Preterm AB Living   1 1 1  0 0 1   SAB TAB Ectopic Multiple Live Births   0 0 0 0 1        Chief Complaint:  Chief Complaint  Patient presents with  ?????? Gynecologic Exam    HPI:  Annual exam, post menopausal.  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  Needed two  surgeries, Dr. Venetia Maxon.  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 On Fosamax since DEXA in 08/2013 showed significant  osteopenia. Pleased on vaginal estrogen.  HPI    Menstrual HX 07/21/2014  Periods Are Post-Menopausal  Bleeding since Menopause no      Health Maintenance  Topic Date Due  ?????? Tetanus Vaccination (Every 10 Years)  09/10/1962  ?????? Hepatitis C Virus (HCV) Screening  09/09/1965  ?????? Zoster-RZV(Shingrix) (1) 09/10/1994  ?????? Pneumovax Vaccine (Once)  09/09/2009  ?????? Fall Risk Assessment  09/09/2009  ?????? Pneumococcal Conjugate (Prevnar) Vaccine  09/09/2009  ?????? Osteoporosis Screening  08/06/2016  ?????? Influenza Vaccination (Yearly)  01/04/2017  ?????? Colon Cancer Screening  06/06/2017  ?????? Cholesterol Screening  06/06/2017  ?????? Breast Cancer Screening  05/11/2018    Health Maintenance Summary     Influenza Vaccination (Yearly) Next Due 01/04/2017    Done 06/06/2009 Imm Admin: Influenza (whole)   Colon Cancer Screening Next Due 06/06/2017    Done 06/07/2007   Cholesterol Screening Next Due 06/06/2017    Done 06/06/2012   Breast Cancer Screening Next Due 05/11/2018    Done 05/11/2016 MAMM-SCREENING DIRECT DIGITAL    Done 08/12/2014 MAMM-SCREENING DIRECT DIGITAL    Done 08/06/2013 MAMM-SCREENING W/TOMO    Done 07/18/2012 MAMM-SCREENING DIRECT DIGITAL    Done 06/20/2011 MAMM-SCREENING DIRECT DIGITAL    Patient has more history with this topic...      Past Medical History:  Diagnosis Date  ?????? Angioedema X 2   on one side of face, not sure of cause  ?????? Complication of anesthesia   difficulty  waking up  ?????? 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 HX CESAREAN SECTION  1985  ?????? HX DILATION AND CURETTAGE OF UTERUS  ?????? LAP,DIAGNOSTIC ABDOMEN  1983  ?????? OPEN TX TIBIAL FRACTURE PROXIMAL UNICONDYLAR  Dec. 2007   Left Leg x2    Family History  Problem Relation Age of Onset  ?????? Heart Problems Neg Hx  ?????? Anesthesia Complications Neg Hx  ?????? Stroke Mother    Current Outpatient Prescriptions  Medication Sig Dispense Refill  ?????? alendronate (FOSAMAX) 70 mg Tablet Take 1 Tab by mouth every 7 days (once  weekly). 12 Tab 3  ?????? CALCIUM CARBONATE/VITAMIN D3 (CALCIUM + D PO) Take  by mouth daily.  ?????? estradiol (ESTRACE) 0.01 % (0.1 mg/gram) Cream  ?????? estrogens, conjugated, (PREMARIN) 0.625 mg/gram Cream Insert 1 g into vagina  every 7 days (once weekly). 30 g 0  ?????? PREMPRO 0.3-1.5 mg PO Tab 1 Tab daily.  ?????? PV W-O CAL/FERROUS FUMARATE/FA (M-VIT PO) Take 1 Tab by mouth daily.    No current facility-administered medications for this visit.    Allergies  Allergen Reactions  ?????? Iodine Anaphylaxis  IVP dye for a kidney function test greater than 20 years ago  ?????? Shellfish Containing Products      Social History    Social History  ?????? Marital status: Married    Spouse name: N/A  ?????? Number of children: N/A  ?????? Years of education: N/A    Occupational History  ?????? Not on file.    Social History Main Topics  ?????? Smoking status: Never Smoker  ?????? Smokeless tobacco: Never Used  ?????? Alcohol use 0.5 - 1.0 oz/week    1 - 2 Standard drinks or equivalent per week     Comment: 1/day  ?????? Drug use: No  ?????? Sexual activity: Yes    Other Topics Concern  ?????? Not on file    Social History Narrative  ?????? No narrative on file      ROS    Constitutional Negative for Constitutional Systems  Eyes Negative for Eye Systems  HENT Negative for HENT Systems  Breasts Neagtive for Breast Systems  Cardiovascular Negative for Cardio  Systems  Respiratory Negative for Respiratory Systems  Gastrointestinal Negative for GI Systems  Genitourinary Negative for GU Systems  Neurological Negative for Neurological Systems  Musculoskeletal Negative for Musculoskeletal Systems  Endocrine Negative for Endocrine Systems  Psychiatric Negative for Psychiatric Systems  Hematologic/Lymphatic Negative for Hematologic/Lymphatic Systems  Skin Negative for Skin Systems          Objective:  Physical Exam  Constitutional: She is oriented to person, place, and time. She appears  well-developed and well-nourished. She is cooperative.  HENT:  Head: Normocephalic and atraumatic.  Right Ear: Hearing and external ear normal.  Left Ear: Hearing and external ear normal.  Nose: Nose normal. No rhinorrhea.  Mouth/Throat: Mucous membranes are normal.  Eyes: Conjunctivae and lids are normal. Right eye exhibits no discharge. Left  eye exhibits no discharge. No scleral icterus.  Neck: Normal range of motion. Neck supple. No tracheal deviation present. No  thyroid mass and no thyromegaly present.  Pulmonary/Chest: Effort normal. No respiratory distress. She exhibits no  tenderness. Right breast exhibits no mass, no skin change and no tenderness.  Left breast exhibits no mass, no skin change and no tenderness.  Abdominal: Soft. Normal appearance and bowel sounds are normal. She exhibits no  distension and no mass. There is no hepatosplenomegaly. There is no tenderness.  There is no rigidity and no CVA tenderness. No hernia.  Genitourinary: Vagina normal and uterus normal. Rectal exam shows no external  hemorrhoid, no internal hemorrhoid and no tenderness. No breast swelling,  tenderness, discharge or bleeding. There is no rash or lesion on the right  labia. There is no rash or lesion on the left labia. Uterus is not deviated, not  enlarged, not fixed and not tender. Cervix exhibits no motion tenderness, no  discharge and no friability. Right adnexum displays no mass and no  tenderness.  Left adnexum displays no mass and no tenderness. No tenderness or bleeding in  the vagina. No foreign body in the vagina. No vaginal discharge found.  Genitourinary Comments: Cystocele - none  Rectocele - none  Uterine prolapse - none  Musculoskeletal: Normal range of motion. She exhibits no edema.  Lymphadenopathy:    She has no cervical adenopathy.    She has no axillary adenopathy.       Right: No inguinal adenopathy present.       Left: No inguinal adenopathy present.  Neurological: She is alert and oriented to person, place, and time.  Skin:  Skin is warm, dry and intact. No rash noted. No erythema. No pallor.  Psychiatric: She has a normal mood and affect. Judgment and thought content  normal.      Assessment:    Encounter Diagnoses  Code Name Primary?  ?????? Z01.419 Well female exam with routine gynecological exam Yes  ?????? M85.80 Osteopenia, unspecified location  ?????? 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    TAKE 1 TAB BY MOUTH EVERY  7 DAYS.      Quantity: 12 Tab    Quantity: 12 Tab      Notes: --    Notes: --      Orders Placed This Encounter  Procedures  ?????? MAM-DEXA scan axial skeleton      Will plan for DEXA later this year when mammogram due.  Continue Fosamax.

## 2017-06-06 HISTORY — PX: MENISCUS REPAIR: SHX5179

## 2017-10-12 ENCOUNTER — Ambulatory Visit: Admit: 2017-10-12 | Discharge: 2017-10-12 | Payer: PRIVATE HEALTH INSURANCE

## 2017-10-12 DIAGNOSIS — H8110 Benign paroxysmal vertigo, unspecified ear: Secondary | ICD-10-CM

## 2017-10-12 MED ORDER — meclizine (ANTIVERT) 12.5 mg tablet
12.5 | ORAL_TABLET | Freq: Three times a day (TID) | ORAL | 0 refills | Status: AC | PRN
Start: 2017-10-12 — End: 2019-06-25

## 2017-10-12 NOTE — Patient Instructions (Signed)
How to Perform the Epley Maneuver  The Epley maneuver is an exercise that relieves symptoms of vertigo. Vertigo is the feeling that you or your surroundings are moving when they are not. When you feel vertigo, you may feel like the room is spinning and have trouble walking. Dizziness is a little different than vertigo. When you are dizzy, you may feel unsteady or light-headed.  You can do this maneuver at home whenever you have symptoms of vertigo. You can do it up to 3 times a day until your symptoms go away.  Even though the Epley maneuver may relieve your vertigo for a few weeks, it is possible that your symptoms will return. This maneuver relieves vertigo, but it does not relieve dizziness.  What are the risks?  If it is done correctly, the Epley maneuver is considered safe. Sometimes it can lead to dizziness or nausea that goes away after a short time. If you develop other symptoms, such as changes in vision, weakness, or numbness, stop doing the maneuver and call your health care provider.  How to perform the Epley maneuver  1. Sit on the edge of a bed or table with your back straight and your legs extended or hanging over the edge of the bed or table.  2. Turn your head halfway toward the affected ear or side.  3. Lie backward quickly with your head turned until you are lying flat on your back. You may want to position a pillow under your shoulders.  4. Hold this position for 30 seconds. You may experience an attack of vertigo. This is normal.  5. Turn your head to the opposite direction until your unaffected ear is facing the floor.  6. Hold this position for 30 seconds. You may experience an attack of vertigo. This is normal. Hold this position until the vertigo stops.  7. Turn your whole body to the same side as your head. Hold for another 30 seconds.  8. Sit back up.  You can repeat this exercise up to 3 times a day.  Follow these instructions at home:  ?? After doing the Epley maneuver, you can return to  your normal activities.  ?? Ask your health care provider if there is anything you should do at home to prevent vertigo. He or she may recommend that you:  ?? Keep your head raised (elevated) with two or more pillows while you sleep.  ?? Do not sleep on the side of your affected ear.  ?? Get up slowly from bed.  ?? Avoid sudden movements during the day.  ?? Avoid extreme head movement, like looking up or bending over.  Contact a health care provider if:  ?? Your vertigo gets worse.  ?? You have other symptoms, including:  ?? Nausea.  ?? Vomiting.  ?? Headache.  Get help right away if:  ?? You have vision changes.  ?? You have a severe or worsening headache or neck pain.  ?? You cannot stop vomiting.  ?? You have new numbness or weakness in any part of your body.  Summary  ?? Vertigo is the feeling that you or your surroundings are moving when they are not.  ?? The Epley maneuver is an exercise that relieves symptoms of vertigo.  ?? If the Epley maneuver is done correctly, it is considered safe. You can do it up to 3 times a day.  This information is not intended to replace advice given to you by your health care provider. Make sure  you discuss any questions you have with your health care provider.  Document Released: 05/28/2013 Document Revised: 04/12/2016 Document Reviewed: 04/12/2016  Elsevier Interactive Patient Education ?? 2017 [employer].

## 2017-10-12 NOTE — Progress Notes (Signed)
[facility] [place]  [address] [place] [address]    Name:  ARACELY KUNSMAN Date of Birth: 05-27-1946 (72 y.o.)   MRN: [id number]    Date of Service:  10/12/2017     Subjective:     Chief Complaint   Patient presents with    Dizziness     has had vertigo all week but it only happens at night     History of Present Illness:  [name] is a(n) 72 y.o. 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 Saw RW for this 11/2012  -she states she had a severe episode of vertigo 3 days ago. She states she was sleeping and then as soon as she turned her head she experienced severe veritgo. She states she went into a panic attack with palpitations and shortness of breath. The vertigo only occurred with head movements. Felt similar to BPPV episode in 2014.  -she had another episode the next night (2 nights ago), again while she was in bed. She turned her head and got vertigo again, this time without the panicky symptoms.  -she had a third episode last night, again similar to her prior episodes  -she spoke with a friend who advised her to do vestibular rehab. She called [facility]    Current Outpatient Prescriptions   Medication Sig Dispense Refill    alendronate (FOSAMAX) 70 MG tablet       ibuprofen (ADVIL,MOTRIN) 200 MG tablet Take 200 mg by mouth daily as needed for Pain. Takes few times a week due to neck pain and occas bedtime      alendronate (FOSAMAX) 70 MG tablet TAKE 1 TAB BY MOUTH EVERY 7 DAYS.      doxycycline (VIBRA-TABS) 100 MG tablet 1 capsule twice daily. 14 tablet 0     Current Facility-Administered Medications   Medication Dose Route Frequency Provider Last Rate Last Dose    bupivacaine (MARCAINE) 0.5 % (5 mg/mL) injection 10 mg  2 mL Subcutaneous Once [employer], [place]        lidocaine 10 mg/mL (1 %) injection 2 mL  2 mL Subcutaneous Once Marissa  Lindsey Baum, [place]        triamcinolone acetonide (KENALOG-40) injection 80 mg  80 mg Intra-articular Once Duffy Bruce, [place]          Review of Systems         Objective:     Vitals:    10/12/17 1642   BP: 128/70   Pulse: 76   SpO2: 97%   Weight: 99 lb 5 oz (45 kg)     Body mass index is 16.53 kg/m.    Physical Exam  Normal neuro exam  D-H caused mild reproduction of her symptoms            Assessment/Plan:   1. BPPV  -will prescribe meclizine PRN  -refer to PT for vestibular rehab               [name], MD

## 2019-06-25 ENCOUNTER — Other Ambulatory Visit: Admit: 2019-06-25 | Payer: PRIVATE HEALTH INSURANCE

## 2019-06-25 ENCOUNTER — Ambulatory Visit: Admit: 2019-06-25 | Discharge: 2019-06-25 | Payer: PRIVATE HEALTH INSURANCE

## 2019-06-25 DIAGNOSIS — Z0000 Encounter for general adult medical examination without abnormal findings: Secondary | ICD-10-CM

## 2019-06-25 LAB — LIPID PANEL
Cholesterol, Total: 191 mg/dL (ref 0–200)
HDL: 65 mg/dL (ref 60–92)
LDL Cholesterol: 103 mg/dL
Triglycerides: 114 mg/dL (ref 10–149)

## 2019-06-25 LAB — CBC
Hematocrit: 43.7 % (ref 35.0–45.0)
Hemoglobin: 14.8 g/dL (ref 11.7–15.5)
MCH: 33.2 pg (ref 27.0–33.0)
MCHC: 33.9 g/dL (ref 32.0–36.0)
MCV: 97.8 fL (ref 80.0–100.0)
MPV: 7.6 fL (ref 7.5–11.5)
Platelets: 173 10*3/uL (ref 140–400)
RBC: 4.46 10*6/uL (ref 3.80–5.10)
RDW: 12 % (ref 11.0–15.0)
WBC: 4.5 10*3/uL (ref 3.8–10.8)

## 2019-06-25 LAB — COMPREHENSIVE METABOLIC PANEL, SERUM
ALT: 18 U/L (ref 7–52)
AST (SGOT): 22 U/L (ref 13–39)
Albumin: 4.4 g/dL (ref 3.5–5.7)
Alkaline Phosphatase: 75 U/L (ref 36–125)
Anion Gap: 7 mmol/L (ref 3–16)
BUN: 13 mg/dL (ref 7–25)
CO2: 30 mmol/L (ref 21–33)
Calcium: 9.6 mg/dL (ref 8.6–10.3)
Chloride: 98 mmol/L (ref 98–110)
Creatinine: 0.81 mg/dL (ref 0.60–1.30)
Glucose: 90 mg/dL (ref 70–100)
Osmolality, Calculated: 280 mOsm/kg (ref 278–305)
Potassium: 4.2 mmol/L (ref 3.5–5.3)
Sodium: 135 mmol/L (ref 133–146)
Total Bilirubin: 0.7 mg/dL (ref 0.0–1.5)
Total Protein: 6.6 g/dL (ref 6.4–8.9)
eGFR AA CKD-EPI: 83 See note.
eGFR NONAA CKD-EPI: 72 See note.

## 2019-06-25 LAB — HEMOGLOBIN A1C: Hemoglobin A1C: 5.6 % (ref 4.0–5.6)

## 2019-06-25 NOTE — Unmapped (Signed)
[facility]  [facility] AT [facility] OFFICE  [address]  [place] [address]    Name:  [name] Date of Birth: 27-Dec-1945 (74 y.o.)   MRN: [id number]    Date of Service:  06/25/2019     Subjective:     No chief complaint on file.    History of Present Illness:  [name] is a(n) 74 y.o. 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 Noreene Filbert  Date of surgery: 07/10/2019    Surgery Risk: Low Intermediate High  Past Medical History:   Diagnosis Date   ??? History of fall     fell 10/2015   ??? Low back pain    ??? Osteoarthritis    ??? Osteoporosis    ??? Urticaria    ??? Varicella      Past Surgical History:   Procedure Laterality Date   ??? CESAREAN SECTION  1985   ??? HARDWARE REMOVAL  8/09    of left tibia plateau   ??? LEG SURGERY  05/09/06    fractured left tibia plateau   ??? RELEASE CARPAL TUNNEL Left 03/22/2016    Procedure: LEFT CARPAL TUNNEL RELEASE;  Surgeon: Kathrene Bongo, MD;  Location: [facility];  Service: Orthopedics;  Laterality: Left;   ??? WRIST FRACTURE SURGERY Left 11/13/2015    Procedure: OPEN REDUCTION INTERNAL FIXATION LEFT DISTAL RADIUS FRACTURE;  Surgeon: Kathrene Bongo, MD;  Location: [facility];  Service: Orthopedics;  Laterality: Left;     Social History     Socioeconomic History   ??? Marital status: Married     Spouse name: Not on file   ??? Number of children: Not on file   ??? Years of education: Not on file   ??? Highest education level: Not on file   Occupational History   ??? Not on file   Social Needs   ??? Financial resource strain: Not on file   ??? Food insecurity     Worry: Not on file     Inability: Not on file   ??? Transportation needs     Medical: Not on file     Non-medical: Not on file   Tobacco Use   ??? Smoking status: Never Smoker   ??? Smokeless tobacco: Never Used    Substance and Sexual Activity   ??? Alcohol use: Yes     Alcohol/week: 7.0 standard drinks     Types: 7 Cans of beer per week   ??? Drug use: No     Comment: 12-23-2009   ??? Sexual activity: Yes     Partners: Male   Lifestyle   ??? Physical activity     Days per week: Not on file     Minutes per session: Not on file   ??? Stress: Not on file   Relationships   ??? Social Wellsite geologist on phone: Not on file     Gets together: Not on file     Attends religious service: Not on file     Active member of club or organization: Not on file     Attends meetings of clubs or organizations: Not on file     Relationship status: Not  on file   ??? Intimate partner violence     Fear of current or ex partner: Not on file     Emotionally abused: Not on file     Physically abused: Not on file     Forced sexual activity: Not on file   Other Topics Concern   ??? Caffeine Use Not Asked   ??? Occupational Exposure Not Asked   ??? Exercise Not Asked   ??? Seat Belt Not Asked   Social History Narrative   ??? Not on file     Current Outpatient Medications   Medication Sig Dispense Refill   ??? alendronate (FOSAMAX) 70 MG tablet      ??? alendronate (FOSAMAX) 70 MG tablet TAKE 1 TAB BY MOUTH EVERY 7 DAYS.     ??? doxycycline (VIBRA-TABS) 100 MG tablet 1 capsule twice daily. 14 tablet 0   ??? ibuprofen (ADVIL,MOTRIN) 200 MG tablet Take 200 mg by mouth daily as needed for Pain. Takes few times a week due to neck pain and occas bedtime     ??? meclizine (ANTIVERT) 12.5 mg tablet Take 1-2 tablets (12.5-25 mg total) by mouth 3 times a day as needed. 20 tablet 0     Current Facility-Administered Medications   Medication Dose Route Frequency Provider Last Rate Last Admin   ??? bupivacaine (MARCAINE) 0.5 % (5 mg/mL) injection 10 mg  2 mL Subcutaneous Once Duffy Bruce, [place]       ??? lidocaine 10 mg/mL (1 %) injection 2 mL  2 mL Subcutaneous Once Marissa Lindsey Baum, [place]       ??? triamcinolone acetonide (KENALOG-40) injection 80 mg  80 mg Intra-articular Once [facility], [place]         No results found for: WBC, HGB, HCT, MCV, PLT  Lab Results   Component Value Date    CREATININE 1.02 12/30/2009    CREATININE 1.02 12/30/2009    BUN 18 12/30/2009    BUN 18 12/30/2009    NA 137 12/30/2009    NA 137 12/30/2009    K 4.1 12/30/2009    K 4.1 12/30/2009    CL 100 12/30/2009    CL 100 12/30/2009    CO2 27 12/30/2009    CO2 27 12/30/2009     ?  Major Predictors:  Recent PCI/MI/unstable angina: no  Decompensated HF or new onset HF: no  Arrhythmias: no  High-grade atrioventricular block    Symptomatic ventricular arrhythmias in the presence of underlying heart disease    Supraventricular arrhythmias with uncontrolled ventricular rate)   Severe heart valve disease: no     RCRI:  High-risk type of surgery (vascular surgery or any open intraperitoneal or intrathoracic procedures):  History of ischemic heart disease (history of MI or a positive exercise test, current complaint of chest pain considered to be secondary to myocardial ischemia, use of nitrate therapy, or ECG with pathological Q waves; do not count prior coronary revascularization procedure unless one of the other criteria for ischemic heart disease is present): no  History of HF: no  History of cerebrovascular disease: no  Diabetes mellitus requiring treatment with insulin: no  Preoperative serum creatinine >2: no    RCRI score: zero    Interpretation of Risk Score   Risk class Points Risk of MACE (%)   I. Very low 0 3.9   II. Low 1 6.0   III. Moderate 2 10.1   IV. High 3 + 15.0     Functional Capacity:  Can take care of self, such as eat, dress or use the toilet (1 MET)   Can walk up a flight of steps or a hill (4 METs)   Can do heavy work around the house such as scrubbing floors or lifting or moving heavy furniture (between 4 and 10 METs)   Can participate in strenuous sports such as swimming, singles tennis, football, basketball, and skiing (>10 METs)    No family history of malignant hyperthermia. No know history of  bleeding disorder. No previous history of difficulty with anesthesia.       Current Outpatient Medications   Medication Sig Dispense Refill   ??? ibuprofen (ADVIL,MOTRIN) 200 MG tablet Take 200 mg by mouth daily as needed for Pain. Takes few times a week due to neck pain and occas bedtime       Current Facility-Administered Medications   Medication Dose Route Frequency Provider Last Rate Last Admin   ??? bupivacaine (MARCAINE) 0.5 % (5 mg/mL) injection 10 mg  2 mL Subcutaneous Once Duffy Bruce, [place]       ??? lidocaine 10 mg/mL (1 %) injection 2 mL  2 mL Subcutaneous Once Marissa Lindsey Baum, [place]       ??? triamcinolone acetonide (KENALOG-40) injection 80 mg  80 mg Intra-articular Once Duffy Bruce, [place]          Review of Systems   Constitutional: Negative for activity change, appetite change, chills, diaphoresis, fatigue, fever, weight gain and weight loss.   HENT: Negative for ear pain, facial swelling, hearing loss, sinus pressure and trouble swallowing.    Eyes: Negative for photophobia, pain, discharge, redness, itching and visual disturbance.   Respiratory: Negative for apnea, cough, choking, chest tightness, shortness of breath, wheezing and stridor.    Cardiovascular: Negative for chest pain, palpitations and leg swelling.   Gastrointestinal: Negative for abdominal distention, abdominal pain, blood in stool, constipation, diarrhea, heartburn, nausea and vomiting.   Genitourinary: Negative for difficulty urinating, dysuria, flank pain, frequency and hematuria.   Musculoskeletal: Negative for arthralgias, back pain, gait problem, joint swelling, myalgias, neck pain and neck stiffness.   Skin: Negative for color change, pallor, rash and wound.   Neurological: Negative for dizziness, tremors, seizures, syncope, facial asymmetry, speech difficulty, weakness, light-headedness, numbness and headaches.   Hematological: Negative for adenopathy. Does not bruise/bleed easily.   Psychiatric/Behavioral: Negative  for agitation, behavioral problems, decreased concentration, dysphoric mood and sleep disturbance. The patient is not nervous/anxious and is not hyperactive.    All other systems reviewed and are negative.           Objective:   There were no vitals filed for this visit.          There is no height or weight on file to calculate BMI.    Physical Exam  Vitals signs reviewed.   Constitutional:       Appearance: She is well-developed.   HENT:      Head: Normocephalic and atraumatic.      Nose: Nose normal.      Mouth/Throat:      Pharynx: No oropharyngeal exudate.   Eyes:      General:         Right eye: No discharge.         Left eye: No discharge.      Conjunctiva/sclera: Conjunctivae normal.      Pupils: Pupils are equal, round, and reactive to light.   Neck:      Musculoskeletal: Normal  range of motion and neck supple.      Thyroid: No thyromegaly.   Cardiovascular:      Rate and Rhythm: Normal rate and regular rhythm.      Heart sounds: Normal heart sounds. No murmur. No friction rub. No gallop.    Pulmonary:      Effort: Pulmonary effort is normal. No respiratory distress.      Breath sounds: Normal breath sounds. No wheezing or rales.   Chest:      Chest wall: No tenderness.   Abdominal:      General: Bowel sounds are normal. There is no distension.      Palpations: Abdomen is soft. There is no mass.      Tenderness: There is no abdominal tenderness. There is no guarding or rebound.   Musculoskeletal: Normal range of motion.         General: No tenderness.   Skin:     General: Skin is warm and dry.      Findings: No erythema or rash.   Neurological:      Mental Status: She is alert and oriented to person, place, and time.      Cranial Nerves: No cranial nerve deficit.   Psychiatric:         Behavior: Behavior normal.         Thought Content: Thought content normal.         Judgment: Judgment normal.                   Assessment/Plan:   Perioperative evaluation:  -she is medically optimized for surgery  -RCRI =  zero  -she was instructed to hold NSAID's for 7 days prior to surgery  -no further testing or imaging required  -routine labs for routine health maintenance       Number and Complexity of Problems (Level 4)  During this encounter, I addressed 2 or more stable chronic illnesses.    Amount and/or Complexity of Data Ordered, Reviewed, or Analyzed (Level 2)  Risk of Complication and/or Morbidity or Mortality of Patient Management (Level  4)  The patient's management entails Moderate risk of complications and/or morbidity or mortality.    Overall LOS:  4                     Kinisha Soper Jerilynn Mages, MD

## 2019-06-26 ENCOUNTER — Ambulatory Visit: Payer: PRIVATE HEALTH INSURANCE

## 2020-05-14 ENCOUNTER — Inpatient Hospital Stay: Admit: 2020-05-14 | Discharge: 2020-05-18 | Payer: PRIVATE HEALTH INSURANCE

## 2020-05-14 ENCOUNTER — Ambulatory Visit: Admit: 2020-05-14 | Discharge: 2020-05-14 | Payer: PRIVATE HEALTH INSURANCE

## 2020-05-14 DIAGNOSIS — M25562 Pain in left knee: Secondary | ICD-10-CM

## 2020-05-14 DIAGNOSIS — G8929 Other chronic pain: Secondary | ICD-10-CM

## 2020-05-14 NOTE — Unmapped (Signed)
This office note has been dictated.

## 2020-05-14 NOTE — Unmapped (Signed)
ZO:XWRU knee pain    HPI: TUJUANA KILMARTIN is a 74 y.o. female who presents to clinic today with complaints of left knee pain. She has a history of left tibial plateau fracture requiring surgical fixation and subsequent removal. She has pain intermittently occurring once a week. She takes tylenol as needed.     ROS: All others negative except for as stated in HPI    Past Medical History:   Diagnosis Date   ??? Carpal tunnel syndrome on left 03/18/2016    Added automatically from request for surgery [id number]   ??? Closed fracture of upper end of tibia 12/18/2009   ??? History of fall     fell 10/2015   ??? Low back pain    ??? Osteoarthritis    ??? Osteoporosis    ??? Urticaria    ??? Varicella        Past Surgical History:   Procedure Laterality Date   ??? CESAREAN SECTION  1985   ??? HARDWARE REMOVAL  8/09    of left tibia plateau   ??? LEG SURGERY  05/09/06    fractured left tibia plateau   ??? RELEASE CARPAL TUNNEL Left 03/22/2016    Procedure: LEFT CARPAL TUNNEL RELEASE;  Surgeon: Kathrene Bongo, MD;  Location: [facility];  Service: Orthopedics;  Laterality: Left;   ??? WRIST FRACTURE SURGERY Left 11/13/2015    Procedure: OPEN REDUCTION INTERNAL FIXATION LEFT DISTAL RADIUS FRACTURE;  Surgeon: Kathrene Bongo, MD;  Location: [facility];  Service: Orthopedics;  Laterality: Left;       Social History     Socioeconomic History   ??? Marital status: Married     Spouse name: Not on file   ??? Number of children: Not on file   ??? Years of education: Not on file   ??? Highest education level: Not on file   Occupational History   ??? Not on file   Tobacco Use   ??? Smoking status: Never Smoker   ??? Smokeless tobacco: Never Used   Vaping Use   ??? Vaping Use: Never used   Substance and Sexual Activity   ??? Alcohol use: Yes     Alcohol/week: 7.0 standard drinks     Types: 7 Cans of beer per week   ??? Drug use: No     Comment: 12-23-2009   ??? Sexual activity: Yes     Partners: Male   Other Topics Concern   ??? Caffeine Use Not Asked   ??? Occupational Exposure Not Asked   ??? Exercise  Not Asked   ??? Seat Belt Not Asked   Social History Narrative   ??? Not on file     Social Determinants of Health     Financial Resource Strain: Not on file   Physical Activity: Not on file   Stress: Not on file   Social Connections: Not on file   Housing Stability: Not on file       Allergies   Allergen Reactions   ??? Iodine Anaphylaxis     IVP dye for a kidney function test greater than 20 years ago   ??? Iodine And Iodide Containing Products Anaphylaxis     Respiratory Distress  IVP dye for a kidney function test greater than 20 years ago  Respiratory Distress   ??? Iodinated Contrast Media      Shortness of breath, hives.   ??? Shellfish Containing Products      Advised to avoid shellfish due to allergy to iodine.  Current Outpatient Medications on File Prior to Visit   Medication Sig Dispense Refill   ??? ibuprofen (ADVIL,MOTRIN) 200 MG tablet Take 200 mg by mouth daily as needed for Pain. Takes few times a week due to neck pain and occas bedtime       Current Facility-Administered Medications on File Prior to Visit   Medication Dose Route Frequency Provider Last Rate Last Admin   ??? bupivacaine (MARCAINE) 0.5 % (5 mg/mL) injection 10 mg  2 mL Subcutaneous Once Duffy Bruce, [place]       ??? lidocaine 10 mg/mL (1 %) injection 2 mL  2 mL Subcutaneous Once Marissa Lindsey Baum, [place]       ??? triamcinolone acetonide (KENALOG-40) injection 80 mg  80 mg Intra-articular Once Duffy Bruce, [place]             Physical Exam:     Vitals:    05/14/20 0927   Weight: 100 lb (45.4 kg)   Height: 5' 5.5 (1.664 m)       General: No distress, Alert & Oriented X 4 and Well-nourished  CV: RRR  Resp: Breathing unlabored   MSK:  Palpable crepitance with range of motion. Slight valgus deformity. Has good ROM and strength. 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 female with left post traumatic knee arthritis, L GT  bursitis      Plan  Continue current treatment  Shown stretching exercises  Follow up as needed.

## 2020-11-27 ENCOUNTER — Other Ambulatory Visit: Payer: Self-pay

## 2020-11-27 ENCOUNTER — Emergency Department
Admission: EM | Admit: 2020-11-27 | Discharge: 2020-11-28 | Disposition: A | Discharge status: Home / Self Care | Payer: Medicare Other | Attending: Emergency Medicine | Admitting: Emergency Medicine

## 2020-11-27 ENCOUNTER — Emergency Department: Payer: Medicare Other

## 2020-11-27 DIAGNOSIS — J441 Chronic obstructive pulmonary disease with (acute) exacerbation: Secondary | ICD-10-CM | POA: Insufficient documentation

## 2020-11-27 DIAGNOSIS — Z28310 Unvaccinated for covid-19: Secondary | ICD-10-CM | POA: Diagnosis not present

## 2020-11-27 DIAGNOSIS — R079 Chest pain, unspecified: Secondary | ICD-10-CM | POA: Diagnosis present

## 2020-11-27 DIAGNOSIS — Z96612 Presence of left artificial shoulder joint: Secondary | ICD-10-CM | POA: Diagnosis not present

## 2020-11-27 DIAGNOSIS — J45909 Unspecified asthma, uncomplicated: Secondary | ICD-10-CM | POA: Insufficient documentation

## 2020-11-27 DIAGNOSIS — N390 Urinary tract infection, site not specified: Secondary | ICD-10-CM | POA: Insufficient documentation

## 2020-11-27 HISTORY — DX: Unspecified asthma, uncomplicated: J45.909

## 2020-11-27 HISTORY — DX: Chronic obstructive pulmonary disease, unspecified: J44.9

## 2020-11-27 LAB — BASIC METABOLIC PANEL
Anion gap: 11 (ref 5–15)
BUN: 14 mg/dL (ref 8–23)
CO2: 25 mmol/L (ref 22–32)
Calcium: 9.3 mg/dL (ref 8.9–10.3)
Chloride: 100 mmol/L (ref 98–111)
Creatinine, Ser: 0.85 mg/dL (ref 0.44–1.00)
GFR, Estimated: >60 mL/min (ref >60)
Glucose, Bld: 125 mg/dL — ABNORMAL HIGH (ref 70–99)
Potassium: 3.7 mmol/L (ref 3.5–5.1)
Sodium: 136 mmol/L (ref 135–145)

## 2020-11-27 LAB — CBC
HCT: 34.9 % — ABNORMAL LOW (ref 36.0–46.0)
Hemoglobin: 10.3 g/dL — ABNORMAL LOW (ref 12.0–15.0)
MCH: 24.1 pg — ABNORMAL LOW (ref 26.0–34.0)
MCHC: 29.5 g/dL — ABNORMAL LOW (ref 30.0–36.0)
MCV: 81.5 fL (ref 80.0–100.0)
Platelets: 297 10*3/uL (ref 150–400)
RBC: 4.28 MIL/uL (ref 3.87–5.11)
RDW: 23.7 % — ABNORMAL HIGH (ref 11.5–15.5)
WBC: 9 10*3/uL (ref 4.0–10.5)
nRBC: 0 % (ref 0.0–0.2)

## 2020-11-27 LAB — TROPONIN I (HIGH SENSITIVITY): Troponin I (High Sensitivity): 6 ng/L (ref <18)

## 2020-11-27 NOTE — ED Triage Notes (Signed)
Pt arrives via POV with c/o chest tightness and cough that began four days ago. Hx of asthma and COPD. Reports monthly COPD flare-ups.

## 2020-11-28 DIAGNOSIS — J441 Chronic obstructive pulmonary disease with (acute) exacerbation: Secondary | ICD-10-CM | POA: Diagnosis not present

## 2020-11-28 LAB — URINALYSIS, ROUTINE W REFLEX MICROSCOPIC
Bilirubin Urine: NEGATIVE
Glucose, UA: NEGATIVE mg/dL
Ketones, ur: NEGATIVE mg/dL
Nitrite: NEGATIVE
Protein, ur: NEGATIVE mg/dL
Specific Gravity, Urine: 1.021 (ref 1.005–1.030)
WBC, UA: >50 WBC/hpf — ABNORMAL HIGH (ref 0–5)
pH: 5 (ref 5.0–8.0)

## 2020-11-28 MED ORDER — IPRATROPIUM-ALBUTEROL 0.5-2.5 (3) MG/3ML IN SOLN
3.0000 mL | Freq: Once | RESPIRATORY_TRACT | Status: AC
  Administered 2020-11-28: 3 mL via RESPIRATORY_TRACT

## 2020-11-28 MED ORDER — CEPHALEXIN 500 MG PO CAPS
500.0000 mg | ORAL_CAPSULE | Freq: Once | ORAL | Status: AC
  Administered 2020-11-28: 500 mg via ORAL
  Filled 2020-11-28: qty 1

## 2020-11-28 MED ORDER — HYDROCOD POLST-CPM POLST ER 10-8 MG/5ML PO SUER
5.0000 mL | Freq: Once | ORAL | Status: AC
  Administered 2020-11-28: 5 mL via ORAL
  Filled 2020-11-28: qty 5

## 2020-11-28 MED ORDER — PREDNISONE 20 MG PO TABS
40.0000 mg | ORAL_TABLET | ORAL | Status: AC
  Administered 2020-11-28: 40 mg via ORAL
  Filled 2020-11-28: qty 2

## 2020-11-28 MED ORDER — CEPHALEXIN 500 MG PO CAPS: 500.0000 mg | ORAL_CAPSULE | Freq: Four times a day (QID) | ORAL | 0 refills | Status: AC

## 2020-11-28 MED ORDER — PREDNISONE 10 MG PO TABS: ORAL_TABLET | ORAL | 0 refills | Status: DC

## 2020-11-28 MED ORDER — IBUPROFEN 800 MG PO TABS
800.0000 mg | ORAL_TABLET | Freq: Once | ORAL | Status: AC
  Administered 2020-11-28: 800 mg via ORAL

## 2020-11-28 MED ORDER — ALBUTEROL SULFATE HFA 108 (90 BASE) MCG/ACT IN AERS: INHALATION_SPRAY | RESPIRATORY_TRACT | 0 refills | Status: DC

## 2020-11-28 MED ORDER — IPRATROPIUM-ALBUTEROL 0.5-2.5 (3) MG/3ML IN SOLN
3.0000 mL | Freq: Once | RESPIRATORY_TRACT | Status: AC
  Administered 2020-11-28: 3 mL via RESPIRATORY_TRACT
  Filled 2020-11-28: qty 3

## 2020-11-28 NOTE — ED Notes (Signed)
Pt states she feels like she is having flare up of COPD. Chest pressure intermittently, states this is normal for her flare up

## 2020-11-28 NOTE — ED Provider Notes (Signed)
[facility] Emergency Department Provider Note  ____________________________________________   Event Date/Time   First MD Initiated Contact with Patient 11/27/20 2357     (approximate)  I have reviewed the triage vital signs and the nursing notes.   HISTORY  Chief Complaint Chest Pain and Cough    HPI [name] is a 75 y.o. female with a history of COPD and asthma who never smoked but had multiple decades of secondhand smoke exposure from her husband.  She does not use oxygen at baseline but has occasional flareups both due to allergies and just because of the COPD.  She just recently relocated here with her son from [place] and does not have a primary care doctor.  She said that for the last 3 days she has felt her shortness of breath gradually worsening with an increased cough and exertional shortness of breath.  She feels very tired and worn down.  This feels like it usually does when she is having a COPD exacerbation.  Nothing in particular makes her better.  She does not have all of her medications with her because of her recent move, but she does have her nebulizer with plenty of albuterol solution.  She does not have her inhaler.  She has not been on steroids recently.  She has a slightly sore throat which she says happens when she coughs a lot.  She has not been vaccinated for COVID-19 and says "and I am not going to".  She denies chest pain, nausea, vomiting, and abdominal pain.  She said her symptoms are least moderate and she is concerned they are going to get worse.     Past Medical History:  Diagnosis Date   Asthma    COPD (chronic obstructive pulmonary disease) (HCC)     There are no problems to display for this patient.   Past Surgical History:  Procedure Laterality Date   CATARACT EXTRACTION Bilateral    GALLBLADDER SURGERY     HUMERUS SURGERY Left    THYROIDECTOMY     TONSILECTOMY, ADENOIDECTOMY, BILATERAL MYRINGOTOMY AND TUBES      TOTAL SHOULDER REPLACEMENT Left    WRIST SURGERY      Prior to Admission medications   Medication Sig Start Date End Date Taking? Authorizing Provider  albuterol (VENTOLIN HFA) 108 (90 Base) MCG/ACT inhaler Inhale 2-4 puffs by mouth every 4 hours as needed for wheezing, cough, and/or shortness of breath 11/28/20  Yes Loleta Rose, MD  cephALEXin (KEFLEX) 500 MG capsule Take 1 capsule (500 mg total) by mouth 4 (four) times daily for 10 days. 11/28/20 12/08/20 Yes Loleta Rose, MD  predniSONE (DELTASONE) 10 MG tablet Take 4 tabs (40 mg) PO x 3 days, then take 2 tabs (20 mg) PO x 3 days, then take 1 tab (10 mg) PO x 3 days, then take 1/2 tab (5 mg) PO x 4 days. 11/28/20  Yes Loleta Rose, MD    Allergies Contrast media [iodinated diagnostic agents]  History reviewed. No pertinent family history.  Social History Social History   Tobacco Use   Smoking status: Never   Smokeless tobacco: Never  Substance Use Topics   Alcohol use: Never   Drug use: Never    Review of Systems Constitutional: No fever/chills.  Positive for general malaise and fatigue. Eyes: No visual changes. ENT: Positive for mild sore throat that she believes is due to coughing. Cardiovascular: Denies chest pain. Respiratory: Shortness of breath, cough, worse with exertion. Gastrointestinal: No abdominal pain.  No nausea, no vomiting.  No diarrhea.  No constipation. Genitourinary: Negative for dysuria. Musculoskeletal: Negative for neck pain.  Negative for back pain. Integumentary: Negative for rash. Neurological: Negative for headaches, focal weakness or numbness.   ____________________________________________   PHYSICAL EXAM:  VITAL SIGNS: ED Triage Vitals  Enc Vitals Group     BP 11/27/20 1804 132/75     Pulse Rate 11/27/20 1804 68     Resp 11/27/20 1804 (!) 21     Temp 11/27/20 1804 98.2 F (36.8 C)     Temp Source 11/27/20 1804 Oral     SpO2 11/27/20 1804 99 %     Weight 11/27/20 1806 73.9 kg  (163 lb)     Height 11/27/20 1806 1.549 m (5\' 1" )     Head Circumference --      Peak Flow --      Pain Score 11/27/20 1805 4     Pain Loc --      Pain Edu? --      Excl. in GC? --     Constitutional: Alert and oriented.  Eyes: Conjunctivae are normal.  Head: Atraumatic. Nose: No congestion/rhinnorhea. Mouth/Throat: Patient is wearing a mask. Neck: No stridor.  No meningeal signs.   Cardiovascular: Normal rate, regular rhythm. Good peripheral circulation. Respiratory: Normal respiratory effort.  No retractions or accessory muscle usage.  Mild expiratory wheezing throughout but it is very minimal.  Patient has frequent nonproductive cough. Gastrointestinal: Soft and nontender. No distention.  Musculoskeletal: No lower extremity tenderness nor edema. No gross deformities of extremities. Neurologic:  Normal speech and language. No gross focal neurologic deficits are appreciated.  Skin:  Skin is warm, dry and intact. Psychiatric: Mood and affect are normal. Speech and behavior are normal.  ____________________________________________   LABS (all labs ordered are listed, but only abnormal results are displayed)  Labs Reviewed  BASIC METABOLIC PANEL - Abnormal; Notable for the following components:      Result Value   Glucose, Bld 125 (*)    All other components within normal limits  CBC - Abnormal; Notable for the following components:   Hemoglobin 10.3 (*)    HCT 34.9 (*)    MCH 24.1 (*)    MCHC 29.5 (*)    RDW 23.7 (*)    All other components within normal limits  URINALYSIS, ROUTINE W REFLEX MICROSCOPIC - Abnormal; Notable for the following components:   Color, Urine YELLOW (*)    APPearance HAZY (*)    Hgb urine dipstick SMALL (*)    Leukocytes,Ua LARGE (*)    WBC, UA >50 (*)    Bacteria, UA RARE (*)    All other components within normal limits  URINE CULTURE  TROPONIN I (HIGH SENSITIVITY)   ____________________________________________  EKG  ED ECG REPORT I,  11/29/20, the attending physician, personally viewed and interpreted this ECG.  Date: 11/27/2020 EKG Time: 18: 06 Rate: 66 Rhythm: normal sinus rhythm QRS Axis: normal Intervals: normal ST/T Wave abnormalities: normal Narrative Interpretation: no evidence of acute ischemia  ____________________________________________  RADIOLOGY I, 07, personally viewed and evaluated these images (plain radiographs) as part of my medical decision making, as well as reviewing the written report by the radiologist.  ED MD interpretation: COPD without any acute abnormality  Official radiology report(s): DG Chest 2 View  Result Date: 11/27/2020 CLINICAL DATA:  Chest pain EXAM: CHEST - 2 VIEW COMPARISON:  None FINDINGS: Implantable loop recorder projects over the left chest. The aorta is calcified.  The remaining cardiomediastinal contours are unremarkable. Coarsened interstitial changes and bronchitic features compatible with documented history of COPD. Calcific density nodule seen in the right lung apex, suspect a small granuloma. No consolidation, features of edema, pneumothorax, or effusion. Reverse left shoulder arthroplasty. Degenerative changes in the right shoulder. No acute osseous or soft tissue abnormality. Surgical clips in the right upper quadrant. IMPRESSION: No acute cardiopulmonary abnormality. Coarsened interstitial and bronchitic features compatible with history of COPD. Aortic Atherosclerosis (ICD10-I70.0). Small calcific density nodule in the right lung apex, likely granuloma. Electronically Signed   By: Kreg ShropshirePrice  DeHay M.D.   On: 11/27/2020 19:17    ____________________________________________   PROCEDURES   Procedure(s) performed (including Critical Care):  Procedures   ____________________________________________   INITIAL IMPRESSION / MDM / ASSESSMENT AND PLAN / ED COURSE  As part of my medical decision making, I reviewed the following data within the electronic  MEDICAL RECORD NUMBER Nursing notes reviewed and incorporated, Labs reviewed , EKG interpreted , Old chart reviewed, Radiograph reviewed , and Notes from prior ED visits   Differential diagnosis includes, but is not limited to, COPD exacerbation, asthma exacerbation, pneumonia, COVID-19.  No chest pain, nonischemic EKG, no concern for ACS or PE.  I personally reviewed the patient's imaging and agree with the radiologist's interpretation that there are no acute abnormalities on chest x-ray.  Vital signs are stable and within normal limits including oxygen saturation of 100%.  Physical exam is generally reassuring with only mild wheezing, no retractions or accessory muscle usage, no respiratory distress.   I talked with the patient about COVID-19 testing and she adamantly declines.  She has a normal basic metabolic panel and a normal CBC as well as a normal high-sensitivity troponin and given the lack of ischemic changes on EKG and chest pain, there is no indication for second 1.  Her urinalysis is strongly positive and I will treat her empirically with Keflex as well as for COPD.  I am giving her 3 duo nebs and prednisone 40 mg as well as some Tussionex.  I will then reassess.          Clinical Course as of 11/28/20 [id number]  Sat Nov 28, 2020  0118 Patient is feeling better after her breathing treatments.  I will discharge as anticipated with prescriptions listed below and recommendations for how to establish a primary care doctor locally.  I gave my usual and customary return precautions. [CF]    Clinical Course User Index [CF] Loleta RoseForbach, Wen Munford, MD     ____________________________________________  FINAL CLINICAL IMPRESSION(S) / ED DIAGNOSES  Final diagnoses:  COPD exacerbation (HCC)  Urinary tract infection without hematuria, site unspecified     MEDICATIONS GIVEN DURING THIS VISIT:  Medications  cephALEXin (KEFLEX) capsule 500 mg (has no administration in time range)  ipratropium-albuterol  (DUONEB) 0.5-2.5 (3) MG/3ML nebulizer solution 3 mL (3 mLs Nebulization Given 11/28/20 0017)  ipratropium-albuterol (DUONEB) 0.5-2.5 (3) MG/3ML nebulizer solution 3 mL (3 mLs Nebulization Given 11/28/20 0017)  ipratropium-albuterol (DUONEB) 0.5-2.5 (3) MG/3ML nebulizer solution 3 mL (3 mLs Nebulization Given 11/28/20 0017)  predniSONE (DELTASONE) tablet 40 mg (40 mg Oral Given 11/28/20 0016)  chlorpheniramine-HYDROcodone (TUSSIONEX) 10-8 MG/5ML suspension 5 mL (5 mLs Oral Given 11/28/20 0016)  ibuprofen (ADVIL) tablet 800 mg (800 mg Oral Given 11/28/20 0108)     ED Discharge Orders          Ordered    predniSONE (DELTASONE) 10 MG tablet        11/28/20 [id number]  albuterol (VENTOLIN HFA) 108 (90 Base) MCG/ACT inhaler       Note to Pharmacy: Pharmacy may substitute brand and size for insurance-approved equivalent   11/28/20 0137    cephALEXin (KEFLEX) 500 MG capsule  4 times daily        11/28/20 0137             Note:  This document was prepared using Dragon voice recognition software and may include unintentional dictation errors.   Loleta Rose, MD 11/28/20 [phone]

## 2020-11-29 LAB — URINE CULTURE: Special Requests: NORMAL

## 2020-12-03 ENCOUNTER — Ambulatory Visit: Payer: PRIVATE HEALTH INSURANCE

## 2020-12-10 ENCOUNTER — Ambulatory Visit: Admit: 2020-12-10 | Discharge: 2020-12-10 | Payer: PRIVATE HEALTH INSURANCE

## 2020-12-10 DIAGNOSIS — M1712 Unilateral primary osteoarthritis, left knee: Secondary | ICD-10-CM

## 2020-12-10 MED ORDER — triamcinoloneacetonideKENALOG40injection80mg
40 | Freq: Once | INTRAMUSCULAR | Status: AC
Start: 2020-12-10 — End: 2020-12-10
  Administered 2020-12-10: 14:00:00 80 mg via INTRA_ARTICULAR

## 2020-12-10 MED ORDER — lidocaine 10 mg/mL (1 %) injection 2 mL
10 | Freq: Once | INTRAMUSCULAR | Status: AC
Start: 2020-12-10 — End: 2020-12-10
  Administered 2020-12-10: 14:00:00 2 mL

## 2020-12-10 MED ORDER — bupivacaine HCl (MARCAINE) 0.5 % (5 mg/mL) injection 10 mg
0.5 | Freq: Once | INTRAMUSCULAR | Status: AC
Start: 2020-12-10 — End: 2020-12-10
  Administered 2020-12-10: 14:00:00 10 mL via SUBCUTANEOUS

## 2020-12-10 NOTE — Unmapped (Signed)
[name] is here for evaluation of left knee pain.  She has a left tibial plateau fracture that was treated by myself a few years back.  She also has hardware removal.  She has had several injection of her left knee over the past few years.  None over the last 2+ years.  Left knee pain is affecting her daily activities she had couple episode of pain which causes the patient to fall.  Currently she is using a cane.    On exam thin female with a significant valgus alignment to the left knee.  There is effusion appreciated on left knee as well.  Range of motion is full of 0-120.    X-ray shows moderate to severe left knee osteoarthritis with valgus alignment.  There is bone-on-bone articulation of the lateral compartment.     Assessment left knee osteoarthritis valgus.    Plan will recommend patient will does not want to have surgical intervention at this time.  We recommend an injection along with unloader brace to help with his symptoms.  Under aseptic conditions left knee was injected with standard mixture Kenalog mixture.  She tolerated tolerated injection well.  Unloader brace was fitted for her as well and will like to really evaluate her in several months.

## 2020-12-10 NOTE — Unmapped (Signed)
Brace (lat knee unloader) was applied per the doctor's instruction.   Patient tolerated this well with no complications.  Patient was given instructions regarding showering/bathing, brace usage.    Per provider's instructions, the patient was prepped for an injection - as well as drug  allergies were reviewed.   The appropriate medication was drawn, and documented.  A medical assistant was also present to assist with the injection.   The patient was then educated on what to expect following the injection.   All questions were answered.    A verbal consent was obtained and a verbal timeout was performed on 12/10/2020 at 9:29 AM.      Marquis Buggy, MA

## 2020-12-10 NOTE — Unmapped (Signed)
This office note has been dictated.

## 2021-06-09 ENCOUNTER — Inpatient Hospital Stay: Admit: 2021-06-09 | Payer: MEDICARE

## 2021-06-09 ENCOUNTER — Inpatient Hospital Stay: Payer: MEDICARE | Attending: Sports Medicine

## 2021-06-09 ENCOUNTER — Ambulatory Visit: Admit: 2021-06-09 | Discharge: 2021-06-09 | Payer: MEDICARE | Attending: Sports Medicine

## 2021-06-09 DIAGNOSIS — S52501A Unspecified fracture of the lower end of right radius, initial encounter for closed fracture: Secondary | ICD-10-CM

## 2021-06-09 DIAGNOSIS — M25531 Pain in right wrist: Secondary | ICD-10-CM

## 2021-06-09 NOTE — Unmapped (Signed)
Chief Complaint   Patient presents with   ??? New Patient Visit/ Consultation     Right wrist        HPI:      [name] is a 76 y.o. female who presents for evaluation of right wrist injury.      The notes and documentation from the physician extender were reviewed, verified, and edited if necessary.       The patient presents today for evaluation of her right wrist.  She reports that on 06/06/2021 she suffered a FOOSH injury while going for a walk.  She was seen at an urgent care where she was diagnosed with a distal radius fracture.  She comes in today for further evaluation.  She denies any numbness or tingling.  She is right-hand dominant.  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  She has a history of a previous left distal radius fracture that was surgically repaired a few years ago.  She has a history of osteoporosis as well.    Past Medical History:   Diagnosis Date   ??? Carpal tunnel syndrome on left 03/18/2016    Added automatically from request for surgery [id number]   ??? Closed fracture of upper end of tibia 12/18/2009   ??? History of fall     fell 10/2015   ??? Low back pain    ??? Osteoarthritis    ??? Osteoporosis    ??? Urticaria    ??? Varicella        Past Surgical History:   Procedure Laterality Date   ??? CESAREAN SECTION  1985   ??? HARDWARE REMOVAL  8/09    of left tibia plateau   ??? LEG SURGERY  05/09/06    fractured left tibia plateau   ??? RELEASE CARPAL TUNNEL Left 03/22/2016    Procedure: LEFT CARPAL TUNNEL RELEASE;  Surgeon: Kathrene Bongo, MD;  Location: [facility];  Service: Orthopedics;  Laterality: Left;   ??? WRIST FRACTURE SURGERY Left 11/13/2015    Procedure: OPEN REDUCTION INTERNAL FIXATION LEFT DISTAL RADIUS FRACTURE;  Surgeon: Kathrene Bongo, MD;  Location: [facility];  Service: Orthopedics;  Laterality: Left;        Family History   Problem Relation Age of Onset   ??? Stroke Mother    ??? Melanoma Neg Hx        Medication  Current Outpatient Medications   Medication Sig Dispense Refill   ???  ibuprofen (ADVIL,MOTRIN) 200 MG tablet Take 200 mg by mouth daily as needed for Pain. Takes few times a week due to neck pain and occas bedtime       Current Facility-Administered Medications   Medication Dose Route Frequency Provider Last Rate Last Admin   ??? bupivacaine (MARCAINE) 0.5 % (5 mg/mL) injection 10 mg  2 mL Subcutaneous Once Duffy Bruce, [place]       ??? lidocaine 10 mg/mL (1 %) injection 2 mL  2 mL Subcutaneous Once Marissa Lindsey Baum, [place]       ??? triamcinolone acetonide (KENALOG-40) injection 80 mg  80 mg Intra-articular Once Duffy Bruce, [place]           Allergies  Allergies   Allergen Reactions   ??? Iodine Anaphylaxis     IVP dye for a kidney function test greater than 20 years ago   ??? Iodine And Iodide Containing Products Anaphylaxis     Respiratory Distress  IVP dye for a kidney function test greater  than 20 years ago  Respiratory Distress   ??? Iodinated Contrast Media      Shortness of breath, hives.   ??? Shellfish Containing Products      Advised to avoid shellfish due to allergy to iodine.       Review of Systems:  Pertinent items are noted in HPI.  A complete 10 system ROS was reviewed and was otherwise negative for pertinent issues.    Physical Examination:    This is a pleasant female, alert, and in no acute distress.    Vitals:    06/09/21 1101   Weight: (!) 96 lb (43.5 kg)   Height: 5' 5 (1.651 m)       Psychiatric:   The patient's mood is normal and appropriate for their visit     Right wrist exam: She has tenderness palpation over the distal radius.  She has a volar splint in place.  She has good motion of her fingers.  She has swelling throughout the digits as well.  She is neurovascular intact.    Vascular exam:   Extremities warm and well perfused, no significant edema    Respiratory exam: Breathing easy and unlabored    Neuro exam:   No focal neuro deficits    Lymphatic:  No obvious lymphadenopathy    Skin:     Warm, dry    Radiology:     3 view X-rays of the right wrist dated  06/06/2021 were reviewed independently and discussed with the patient.  The films revealed: Distal radius fracture with dorsal angulation    Assessment:     1. Closed fracture of distal end of right radius, unspecified fracture morphology, initial encounter  Orthopedics      2. Right wrist pain  X-ray Wrist Right min 3-views          Plan:    [name] is a 76 y.o. female with right wrist fracture.  She has a distal radius fracture with dorsal angulation.  She is currently immobilized in a splint.  We discussed treatment options.  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  She had surgical fixation of a distal radius fracture on the left a few years ago when she would like to meet with a hand surgeon to discuss surgical fixation to try to preserve motion of her wrist.  We will keep her in a splint at this point.  I will have her follow-up with my partner, Dr. Margette Fast, next week to reassess with repeat x-rays at that point.  The patient has followed with Dr. Venetia Maxon in the past but understands that he is no longer performing surgery.  Pain management was discussed with the patient using OTCs.  She voiced understanding the care plan all of her questions were answered    Discussed the patient's diagnosis, exam, imaging (if applicable), and treatment options:  [x]   Discussed return to activity considerations, including the need to avoid exacerbating activity, high impact, or painful activity  [x]   Discussed the use of pain medication and nonsteroidal anti-inflammatory agents including the risk of side effects which include but not limited to:  the risk of increased blood pressure, bleeding, renal, gastrointestinal, and heart involvement.     [x]  Patient to consider ibuprofen or naprosyn for 1-2 weeks with food.     [x]  The patient can also consider acetaminophen for additional pain if needed.    []  Prescription medication prescribed today (see Epic orders)   [  x] May continue on  their previously prescribed medications  [x]   Ice 3-4 x/day for 15-20 min and after activity  []   Consider glucosamine supplementation - 1500 mg/day for 2-3 months    []   Weight management   []   Consider brace  [x]   Reviewed the role of a rehabilitative exercise program:   []  Patient elected for a referral to therapy     []  Patient elected for a home based program and instruction was given in the office today  [x]   Reviewed the role of additional diagnostic and treatment options including the role of further imaging, injection therapy, and possible surgical considerations.      Follow-up: With Dr. Margette Fast to discuss the risk and benefits of surgical intervention versus conservative management and to reassess prognosis.  Sooner with any problems, questions, concerns, or worsening symptoms.

## 2021-06-14 ENCOUNTER — Inpatient Hospital Stay
Admit: 2021-06-14 | Discharge: 2021-06-18 | Payer: MEDICARE | Attending: Student in an Organized Health Care Education/Training Program

## 2021-06-14 ENCOUNTER — Ambulatory Visit: Admit: 2021-06-14 | Payer: MEDICARE | Attending: Student in an Organized Health Care Education/Training Program

## 2021-06-14 DIAGNOSIS — S52531A Colles' fracture of right radius, initial encounter for closed fracture: Secondary | ICD-10-CM

## 2021-06-14 NOTE — Unmapped (Signed)
Patient's short arm cast was applied per the doctor's instruction.   Patient tolerated this well with no complications. Patient was given instructions regarding showering/bathing.

## 2021-06-14 NOTE — Unmapped (Signed)
Orthopaedics and Sports Medicine  Hand and Upper Extremity Surgery    Keenan Bachelor, MD, MS, FRCS(C)    [name]  06/14/2021    The patient is seen at the request of Referral, Self.    CHIEF COMPLAINT     [name] is a 76 y.o. female seen in consultation regarding a right distal radius fracture sustained on 06/06/21.    Chief Complaint   Patient presents with   ??? 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  She sustained a displaced right distal radius fracture and was placed in a splint at an urgent care.  Since the injury, she has been tolerating her pain reasonably well and has not had any neurologic symptoms.  Her past medical history is fairly unremarkable, although she has had a previous left distal radius fracture treated with ORIF by Dr. Venetia Maxon and had good results from this.  Since her injury, she has noticed increasing pain in her right shoulder as she has a history of rotator cuff issues.      Allergies     Allergies   Allergen Reactions   ??? Iodine Anaphylaxis     IVP dye for a kidney function test greater than 20 years ago   ??? Iodine And Iodide Containing Products Anaphylaxis     Respiratory Distress  IVP dye for a kidney function test greater than 20 years ago  Respiratory Distress   ??? Iodinated Contrast Media      Shortness of breath, hives.   ??? Shellfish Containing Products      Advised to avoid shellfish due to allergy to iodine.       Home Medications     Current Outpatient Medications   Medication Sig   ??? ibuprofen Take 200 mg by mouth daily as needed for Pain. Takes few times a week due to neck pain and occas bedtime     Current Facility-Administered Medications   Medication Dose Frequency Provider Last Admin   ??? bupivacaine HCl  2 mL Once Duffy Bruce, [place]     ??? lidocaine  2 mL Once [address]  [place], [place]     ??? triamcinolone acetonide  80 mg Once Duffy Bruce, [place]         Past Medical, Surgical, & Social History     Past Medical History:   Diagnosis Date   ??? Carpal tunnel syndrome on left 03/18/2016    Added automatically from request for surgery [id number]   ??? Closed fracture of upper end of tibia 12/18/2009   ??? History of fall     fell 10/2015   ??? Low back pain    ??? Osteoarthritis    ??? Osteoporosis    ??? Urticaria    ??? Varicella        Past Surgical History:   Procedure Laterality Date   ??? CESAREAN SECTION  1985   ??? HARDWARE REMOVAL  8/09    of left tibia plateau   ??? LEG SURGERY  05/09/06    fractured left tibia plateau   ??? RELEASE CARPAL TUNNEL Left 03/22/2016    Procedure: LEFT CARPAL TUNNEL RELEASE;  Surgeon: Kathrene Bongo, MD;  Location: [facility];  Service: Orthopedics;  Laterality: Left;   ???  WRIST FRACTURE SURGERY Left 11/13/2015    Procedure: OPEN REDUCTION INTERNAL FIXATION LEFT DISTAL RADIUS FRACTURE;  Surgeon: Kathrene BongoPeter Stern, MD;  Location: [facility];  Service: Orthopedics;  Laterality: Left;       Social History     Occupational History   ??? Not on file   Tobacco Use   ??? Smoking status: Never   ??? Smokeless tobacco: Never   Vaping Use   ??? Vaping Use: Never used   Substance and Sexual Activity   ??? Alcohol use: Yes     Alcohol/week: 7.0 standard drinks     Types: 7 Cans of beer per week   ??? Drug use: No     Comment: 12-23-2009   ??? Sexual activity: Yes     Partners: Male        reports that she has never smoked. She has never used smokeless tobacco.    Family History   Problem Relation Age of Onset   ??? Stroke Mother    ??? Melanoma Neg Hx        Physical Exam     Vital Signs:  Ht 5' 5 (1.651 m)    Wt (!) 96 lb (43.5 kg)    BMI 15.98 kg/m??   BMI: Body mass index is 15.98 kg/m??.    General appearance: healthy, alert, no distress, appropriate for stated age  HEENT: normocephalic, atraumatic  Respiratory exam: Breathing easy and unlabored on room air  Cardiovascular: Extremities warm and well perfused with  brisk capillary refill, no significant edema.  Lymphatic: No obvious lymphadenopathy  Neuro: Alert and oriented to person, place, time, and situation. No focal, motor, or sensory deficit except as noted below.  Psychiatric: Mood and affect normal and appropriate       Upper Extremity Exam     On examination of the right wrist, there is moderate swelling.  There is pain focally over the distal radius.  No associated injury to the forearm or elbow was identified.  Neurovascular exam is intact.    Other Findings:    Skin:  Normal appearance and texture, consistent with age. No rashes. No significant lesions or abnormalities.    Vascular: All fingertips are pink with good capillary refill and of normal temperature.  No edema.  No areas of ischemia or ulcers. Radial pulse 2+.    Joint Stability: No obvious dislocation, subluxation or significant laxity in either upper extremity.    Neurovascular: All of the tendons appear intact. AIN/PIN/Uln motor intact. Sensation grossly intact in the median, radial, and ulnar nerve distributions.    Radiographs & Imaging     X-rays of the right wrist and forearm from 06/09/21 and today were reviewed and discussed independently with the patient.  These show an extra-articular fracture of the distal radius with displacement including dorsal angulation and loss of radial inclination.  On today's x-rays, the loss of radial inclination is quite mild and she is ulnar neutral.  There is slight dorsal tilt of perhaps 5 to 10 degrees on the lateral radiograph.  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 female with a minimally displaced right distal radius fracture.    Plan     Diagnosis, prognosis, and treatment options for this acute, uncomplicated injury were reviewed along with their risks and benefits.  I explained to the patient that the radiographic parameters of her distal radius  fracture are within an acceptable range for nonoperative treatment.  Operative management is also an option and would perhaps result in somewhat faster recovery and reduce need for casting.  Nevertheless in her age group, I would expect her long-term function to be equivalent whether she undergoes operative or nonoperative treatment.  The patient wanted to pursue nonoperative management given these factors.  We have placed her in a short arm cast today and will have her follow-up in 1 week for repeat x-rays.  I did warn her of the possibility of displacement over time, but given the stability of her fracture now 9 days after injury, I am hopeful that it will not displace further. All questions were answered in the office today.           Keenan Bachelor, MD, MS, FRCS(C)  Hand Surgeon    Assistant Professor  Department of Orthopaedic Surgery and Sports Medicine  Tel: [phone]      Please note that some or all of this record was generated using voice recognition software.If there are any questions about the content of this document, please contact me as some errors in transcription may have occurred.

## 2021-06-16 NOTE — Unmapped (Signed)
Called pt and lm that her follow up was changed to 06/25/21 at 10:45.

## 2021-06-18 ENCOUNTER — Ambulatory Visit: Payer: MEDICARE | Attending: Student in an Organized Health Care Education/Training Program

## 2021-06-22 NOTE — Unmapped (Signed)
Patient's daughter called in very upset and states that her mother called in last Thursday about her severe pain and inability to sleep due to the pain but she never heard back from our office. She is insisting that you reach out to patient asap to discuss and hopefully be able to get something called in to help her pain.      I don't see any encounters from last week other  Than [name] leaving a message about an appointment so I;m not sure who patient spoke to.    LOV 06/14/2021    Assessment   ??  [name] is a 76 y.o. female with a minimally displaced right distal radius fracture.  ??  Plan   ??  Diagnosis, prognosis, and treatment options for this acute, uncomplicated injury were reviewed along with their risks and benefits.  I explained to the patient that the radiographic parameters of her distal radius fracture are within an acceptable range for nonoperative treatment.  Operative management is also an option and would perhaps result in somewhat faster recovery and reduce need for casting.  Nevertheless in her age group, I would expect her long-term function to be equivalent whether she undergoes operative or nonoperative treatment.  The patient wanted to pursue nonoperative management given these factors.  We have placed her in a short arm cast today and will have her follow-up in 1 week for repeat x-rays.  I did warn her of the possibility of displacement over time, but given the stability of her fracture now 9 days after injury, I am hopeful that it will not displace further. All questions were answered in the office today.  ??

## 2021-06-23 MED ORDER — traMADoL (ULTRAM) 50 mg tablet
50 | ORAL_TABLET | Freq: Four times a day (QID) | ORAL | 0 refills | Status: AC | PRN
Start: 2021-06-23 — End: 2021-06-28

## 2021-06-23 NOTE — Unmapped (Signed)
Patient calling back now and asking for call back ASAP about her pain.

## 2021-06-25 ENCOUNTER — Ambulatory Visit: Payer: MEDICARE | Attending: Student in an Organized Health Care Education/Training Program

## 2021-06-28 ENCOUNTER — Inpatient Hospital Stay
Admit: 2021-06-28 | Discharge: 2021-07-02 | Payer: MEDICARE | Attending: Student in an Organized Health Care Education/Training Program

## 2021-06-28 ENCOUNTER — Ambulatory Visit
Admit: 2021-06-28 | Discharge: 2021-06-28 | Payer: MEDICARE | Attending: Student in an Organized Health Care Education/Training Program

## 2021-06-28 DIAGNOSIS — S62101D Fracture of unspecified carpal bone, right wrist, subsequent encounter for fracture with routine healing: Secondary | ICD-10-CM

## 2021-06-28 DIAGNOSIS — S52501D Unspecified fracture of the lower end of right radius, subsequent encounter for closed fracture with routine healing: Secondary | ICD-10-CM

## 2021-06-28 NOTE — Unmapped (Signed)
Orthopaedics and Sports Medicine  Hand and Upper Extremity Surgery    [name] Bachelor, MD, MS, FRCS(C)    [name]  06/28/2021    The patient is seen at the request of Referral, Self.    CHIEF COMPLAINT     [name] is a 76 y.o. female seen in follow-up regarding a right distal radius fracture sustained on 06/06/21, being treated non-operatively.    Chief Complaint   Patient presents with   ??? Establish Care     Right wrist follow up          History of Present Illness     [name] initially had considerable pain after her last visit, mostly related to positioning of her arm while sleeping.  She has a very painful shoulder from rotator cuff disease and had difficulty keeping the arm elevated while also keeping the shoulder comfortable.  I did prescribe her tramadol for pain, but she had substantial nausea and lightheadedness from this so I instructed her to discontinue it.  She seems to be coping reasonably well with just Tylenol and Advil.  She otherwise had no issues with the wrist and did not have much pain from that.  She has not had any paresthesias.      Allergies     Allergies   Allergen Reactions   ??? Iodine Anaphylaxis, Hives and Shortness Of Breath     IVP dye for a kidney function test greater than 20 years ago   ??? Iodine And Iodide Containing Products Anaphylaxis     Respiratory Distress  IVP dye for a kidney function test greater than 20 years ago  Respiratory Distress   ??? Iodinated Contrast Media      Shortness of breath, hives.   ??? Shellfish Containing Products      Advised to avoid shellfish due to allergy to iodine.       Home Medications     Current Outpatient Medications   Medication Sig   ??? ibuprofen Take 200 mg by mouth daily as needed for Pain. Takes few times a week due to neck pain and occas bedtime   ??? traMADoL Take 1 tablet (50 mg total) by mouth every 6 hours as needed for Pain for up to 5 days.     Current Facility-Administered Medications   Medication Dose Frequency  Provider Last Admin   ??? bupivacaine HCl  2 mL Once Duffy Bruce, [place]     ??? lidocaine  2 mL Once [address] [place], [place]     ??? triamcinolone acetonide  80 mg Once Duffy Bruce, [place]         Past Medical, Surgical, & Social History     Past Medical History:   Diagnosis Date   ??? Carpal tunnel syndrome on left 03/18/2016    Added automatically from request for surgery [id number]   ??? Closed fracture of upper end of tibia 12/18/2009   ??? History of fall     fell 10/2015   ??? Low back pain    ??? Osteoarthritis    ??? Osteoporosis    ??? Urticaria    ??? Varicella        Past Surgical History:   Procedure Laterality Date   ??? CESAREAN SECTION  1985   ??? HARDWARE REMOVAL  8/09    of left tibia plateau   ??? LEG SURGERY  05/09/06    fractured left tibia plateau   ??? RELEASE CARPAL TUNNEL Left  03/22/2016    Procedure: LEFT CARPAL TUNNEL RELEASE;  Surgeon: Kathrene Bongo, MD;  Location: [facility];  Service: Orthopedics;  Laterality: Left;   ??? WRIST FRACTURE SURGERY Left 11/13/2015    Procedure: OPEN REDUCTION INTERNAL FIXATION LEFT DISTAL RADIUS FRACTURE;  Surgeon: Kathrene Bongo, MD;  Location: [facility];  Service: Orthopedics;  Laterality: Left;       Social History     Occupational History   ??? Not on file   Tobacco Use   ??? Smoking status: Never   ??? Smokeless tobacco: Never   Vaping Use   ??? Vaping Use: Never used   Substance and Sexual Activity   ??? Alcohol use: Yes     Alcohol/week: 7.0 standard drinks     Types: 7 Cans of beer per week   ??? Drug use: No     Comment: 12-23-2009   ??? Sexual activity: Yes     Partners: Male        reports that she has never smoked. She has never used smokeless tobacco.    Family History   Problem Relation Age of Onset   ??? Stroke Mother    ??? Melanoma Neg Hx        Physical Exam     Vital Signs:  Ht 5' 5 (1.651 m)    Wt (!) 96 lb (43.5 kg)    BMI 15.98 kg/m??   BMI: Body mass index is 15.98 kg/m??.    General appearance: healthy, alert, no distress, appropriate for stated age  HEENT: normocephalic,  atraumatic  Respiratory exam: Breathing easy and unlabored on room air  Cardiovascular: Extremities warm and well perfused with brisk capillary refill, no significant edema.  Lymphatic: No obvious lymphadenopathy  Neuro: Alert and oriented to person, place, time, and situation. No focal, motor, or sensory deficit except as noted below.  Psychiatric: Mood and affect normal and appropriate     Upper Extremity Exam     On examination of the right wrist, there is mild deformity at the fracture site due to the dorsal tilt of the distal radius.  She is able to fully bend and extend her fingers.  Swelling is only quite mild at this point.  The skin was in good condition.    Other Findings:    Skin:  Normal appearance and texture, consistent with age. No rashes. No significant lesions or abnormalities.    Vascular: All fingertips are pink with good capillary refill and of normal temperature.  No edema.  No areas of ischemia or ulcers. Radial pulse 2+.    Joint Stability: No obvious dislocation, subluxation or significant laxity in either upper extremity.    Neurovascular: All of the tendons appear intact. AIN/PIN/Uln motor intact. Sensation grossly intact in the median, radial, and ulnar nerve distributions.    Radiographs & Imaging     X-rays of the right wrist dated today were reviewed independently and discussed with the patient.  The films revealed: Slight interval loss of reduction with approximately 10 degrees of dorsal radial tilt and perhaps 1 mm of radial shortening.  Radial inclination is maintained.    Other Diagnostic Data     None    Assessment     [name] is a 76 y.o. female with a right distal radius fracture being treated nonoperatively    Plan     Diagnosis, prognosis, and treatment options for this acute, complicated injury were reviewed along with their risks and benefits.  I explained to the  patient that in her age group she should have equivalent functional results with nonoperative treatment and  operative treatment despite some degree of radiographic displacement.  I did certainly give her the option of undergoing surgery as this would improve her radiographic outcome and prevent any further loss of reduction.  However I felt that continued nonoperative treatment would also be reasonable at this point.  After long discussion, she would like to continue with nonoperative treatment.  I did warn her about ongoing loss of reduction, but it is less likely at this point as the bone has already started to heal.  We have placed her in a new cast today and we will follow-up with her in 3 weeks.  All questions were answered in the office today.            [name] Bachelor, MD, MS, FRCS(C)  Hand Surgeon    Assistant Professor  Department of Orthopaedic Surgery and Sports Medicine  Tel: [phone]      Please note that some or all of this record was generated using voice recognition software.If there are any questions about the content of this document, please contact me as some errors in transcription may have occurred.

## 2021-06-28 NOTE — Unmapped (Signed)
Patient's short arm cast was removed per the doctor's instruction.   Patient tolerated this well with no complications. Patient was given instructions regarding showering/bathing.     Patient's short arm cast was applied per the doctor's instruction.   Patient tolerated this well with no complications. Patient was given instructions regarding showering/bathing.

## 2021-07-02 ENCOUNTER — Ambulatory Visit: Payer: MEDICARE | Attending: Student in an Organized Health Care Education/Training Program

## 2021-07-14 ENCOUNTER — Ambulatory Visit: Payer: MEDICARE

## 2021-07-19 ENCOUNTER — Inpatient Hospital Stay: Admit: 2021-07-19 | Payer: MEDICARE | Attending: Student in an Organized Health Care Education/Training Program

## 2021-07-19 ENCOUNTER — Ambulatory Visit: Payer: MEDICARE | Attending: Student in an Organized Health Care Education/Training Program

## 2021-07-19 ENCOUNTER — Ambulatory Visit
Admit: 2021-07-19 | Discharge: 2021-07-19 | Payer: MEDICARE | Attending: Student in an Organized Health Care Education/Training Program

## 2021-07-19 DIAGNOSIS — S62101D Fracture of unspecified carpal bone, right wrist, subsequent encounter for fracture with routine healing: Secondary | ICD-10-CM

## 2021-07-19 NOTE — Unmapped (Signed)
Patient's short arm cast was removed per the doctor's instruction.   Patient tolerated this well with no complications. Patient was given instructions regarding showering/bathing.     Brace (quick fit) was applied per the doctor's instruction.   Patient tolerated this well with no complications.  Patient was given instructions regarding showering/bathing, brace usage.

## 2021-07-19 NOTE — Unmapped (Signed)
Orthopaedics and Sports Medicine  Hand and Upper Extremity Surgery    Keenan Bachelor, MD, MS, FRCS(C)    [name]  07/19/2021    The patient is seen at the request of Referral, Self.    CHIEF COMPLAINT     [name]??is a 76 y.o.??female??seen in follow-up regarding a right distal radius fracture sustained on??06/06/21, being treated non-operatively.    No chief complaint on file.         History of Present Illness     [name] was seen with her daughter today.  She has tolerated casting reasonably well but has had ongoing aches and pains.  She has also noticed stiffness in her hand.  Today is the first day that the cast was removed.  She has not had any neurologic symptoms.      Allergies     Allergies   Allergen Reactions   ??? Iodine Anaphylaxis, Hives and Shortness Of Breath     IVP dye for a kidney function test greater than 20 years ago   ??? Iodine And Iodide Containing Products Anaphylaxis     Respiratory Distress  IVP dye for a kidney function test greater than 20 years ago  Respiratory Distress   ??? Iodinated Contrast Media      Shortness of breath, hives.   ??? Shellfish Containing Products      Advised to avoid shellfish due to allergy to iodine.       Home Medications     Current Outpatient Medications   Medication Sig   ??? ibuprofen Take 200 mg by mouth daily as needed for Pain. Takes few times a week due to neck pain and occas bedtime     Current Facility-Administered Medications   Medication Dose Frequency Provider Last Admin   ??? BUPivacaine HCl  2 mL Once Duffy Bruce, [place]     ??? lidocaine  2 mL Once [address] [place], [place]     ??? triamcinolone acetonide  80 mg Once Duffy Bruce, [place]         Past Medical, Surgical, & Social History     Past Medical History:   Diagnosis Date   ??? Carpal tunnel syndrome on left 03/18/2016    Added automatically from request for surgery [id number]   ??? Closed fracture of upper end of tibia 12/18/2009   ??? History of fall     fell 10/2015   ??? Low back pain     ??? Osteoarthritis    ??? Osteoporosis    ??? Urticaria    ??? Varicella        Past Surgical History:   Procedure Laterality Date   ??? CESAREAN SECTION  1985   ??? HARDWARE REMOVAL  8/09    of left tibia plateau   ??? LEG SURGERY  05/09/06    fractured left tibia plateau   ??? RELEASE CARPAL TUNNEL Left 03/22/2016    Procedure: LEFT CARPAL TUNNEL RELEASE;  Surgeon: Kathrene Bongo, MD;  Location: [facility];  Service: Orthopedics;  Laterality: Left;   ??? WRIST FRACTURE SURGERY Left 11/13/2015    Procedure: OPEN REDUCTION INTERNAL FIXATION LEFT DISTAL RADIUS FRACTURE;  Surgeon: Kathrene Bongo, MD;  Location: [facility];  Service: Orthopedics;  Laterality: Left;       Social History     Occupational History   ??? Not on file   Tobacco Use   ??? Smoking status: Never   ??? Smokeless tobacco: Never   Vaping  Use   ??? Vaping Use: Never used   Substance and Sexual Activity   ??? Alcohol use: Yes     Alcohol/week: 7.0 standard drinks     Types: 7 Cans of beer per week   ??? Drug use: No     Comment: 12-23-2009   ??? Sexual activity: Yes     Partners: Male        reports that she has never smoked. She has never used smokeless tobacco.    Family History   Problem Relation Age of Onset   ??? Stroke Mother    ??? Melanoma Neg Hx        Physical Exam     Vital Signs:  There were no vitals taken for this visit.  BMI: There is no height or weight on file to calculate BMI.    General appearance: healthy, alert, no distress, appropriate for stated age  HEENT: normocephalic, atraumatic  Respiratory exam: Breathing easy and unlabored on room air  Cardiovascular: Extremities warm and well perfused with brisk capillary refill, no significant edema.  Lymphatic: No obvious lymphadenopathy  Neuro: Alert and oriented to person, place, time, and situation. No focal, motor, or sensory deficit except as noted below.  Psychiatric: Mood and affect normal and appropriate     Upper Extremity Exam     On examination of the right hand, there is still moderate swelling around the wrist.   There is obvious deformity at the thumb [facility] joint with squaring of the thumb base and hyperextension through the MCP joint.  There is no extensive deformity at the wrist except for the notable swelling dorsally.  She is still mildly tender to palpation of the distal radius.  She is also tender over the [facility] joint and more distally over the dorsum of the wrist.  She does tolerate a reasonable amount of range of motion, particularly to wrist extension, with more limited flexion.  She does have full range of motion in flexion and extension of her fingers, but is somewhat stiff to abduction.    Other Findings:    Skin:  Normal appearance and texture, consistent with age. No rashes. No significant lesions or abnormalities.    Vascular: All fingertips are pink with good capillary refill and of normal temperature.  No edema.  No areas of ischemia or ulcers. Radial pulse 2+.    Joint Stability: No obvious dislocation, subluxation or significant laxity in either upper extremity.    Neurovascular: All of the tendons appear intact. AIN/PIN/Uln motor intact. Sensation grossly intact in the median, radial, and ulnar nerve distributions.    Radiographs & Imaging     X-rays of the right wrist dated today were reviewed independently and discussed with the patient.  The films revealed: Stable alignment of the distal radius fracture over the past 3 weeks with residual loss of radial length and slight dorsal tilt.  There is callus formation to indicate bony healing.    Other Diagnostic Data     None    Assessment     [name] is a 76 y.o. female with a right distal radius fracture that is healing clinically and radiographically with acceptable alignment    Plan     Diagnosis, prognosis, and treatment options for this stable chronic problem were reviewed along with their risks and benefits.  At this point, there is no further need for casting.  The patient can wear a splint for protection but should wean this after another 2  weeks. She was provided with a referral for hand therapy.  She can do range of motion as tolerated and start strengthening in another 2 to 4 weeks.  I will follow up with her in 6 weeks. All questions were answered in the office today.            Keenan Bachelor, MD, MS, FRCS(C)  Hand Surgeon    Assistant Professor  Department of Orthopaedic Surgery and Sports Medicine  Tel: [phone]      Please note that some or all of this record was generated using voice recognition software.If there are any questions about the content of this document, please contact me as some errors in transcription may have occurred.

## 2021-08-04 ENCOUNTER — Ambulatory Visit: Payer: MEDICARE | Attending: Sports Medicine

## 2021-08-06 ENCOUNTER — Ambulatory Visit: Payer: MEDICARE | Attending: Sports Medicine

## 2021-08-30 ENCOUNTER — Inpatient Hospital Stay: Admit: 2021-08-30 | Payer: MEDICARE | Attending: Student in an Organized Health Care Education/Training Program

## 2021-08-30 ENCOUNTER — Ambulatory Visit
Admit: 2021-08-30 | Discharge: 2021-08-30 | Payer: MEDICARE | Attending: Student in an Organized Health Care Education/Training Program

## 2021-08-30 DIAGNOSIS — S62101D Fracture of unspecified carpal bone, right wrist, subsequent encounter for fracture with routine healing: Secondary | ICD-10-CM

## 2021-08-30 NOTE — Unmapped (Signed)
Orthopaedics and Sports Medicine  Hand and Upper Extremity Surgery    Keenan Bachelor, MD, MS, FRCS(C)    [name]  08/30/2021    The patient is seen at the request of Referral, Self.    CHIEF COMPLAINT     [name]??is a 76 y.o.??female??seen in??follow-up??regarding a right distal radius fracture sustained on??06/06/21, being treated non-operatively.    Chief Complaint   Patient presents with   ??? Establish Care     Right wrist           History of Present Illness     [name] has been doing hand therapy and progressing gradually.  She has done a good job of regaining her range of motion and has gone back to playing violin.  Over the past 3 weeks, she has developed numbness and tingling in her index, middle, and ring fingers and was told that she has carpal tunnel syndrome.  She has been wearing a night splint, which has seemed to help to some extent.  She is still waking at night and getting intermittent paresthesias throughout the day.  Otherwise she is also having some ulnar-sided wrist pain and has noticed that her dorsal ulna is quite prominent now that her swelling around the wrist has gone down.  She has been trying to get back to her usual activities and is making progress.      Allergies     Allergies   Allergen Reactions   ??? Iodine Anaphylaxis, Hives and Shortness Of Breath     IVP dye for a kidney function test greater than 20 years ago   ??? Iodine And Iodide Containing Products Anaphylaxis     Respiratory Distress  IVP dye for a kidney function test greater than 20 years ago  Respiratory Distress   ??? Iodinated Contrast Media      Shortness of breath, hives.   ??? Shellfish Containing Products      Advised to avoid shellfish due to allergy to iodine.       Home Medications     Current Outpatient Medications   Medication Sig   ??? ibuprofen Take 200 mg by mouth daily as needed for Pain. Takes few times a week due to neck pain and occas bedtime     Current Facility-Administered Medications    Medication Dose Frequency Provider Last Admin   ??? BUPivacaine HCl  2 mL Once Duffy Bruce, [place]     ??? lidocaine  2 mL Once [address] [place], [place]     ??? triamcinolone acetonide  80 mg Once Duffy Bruce, [place]         Past Medical, Surgical, & Social History     Past Medical History:   Diagnosis Date   ??? Carpal tunnel syndrome on left 03/18/2016    Added automatically from request for surgery [id number]   ??? Closed fracture of upper end of tibia 12/18/2009   ??? History of fall     fell 10/2015   ??? Low back pain    ??? Osteoarthritis    ??? Osteoporosis    ??? Urticaria    ??? Varicella        Past Surgical History:   Procedure Laterality Date   ??? CESAREAN SECTION  1985   ??? HARDWARE REMOVAL  8/09    of left tibia plateau   ??? LEG SURGERY  05/09/06    fractured left tibia plateau   ??? RELEASE CARPAL TUNNEL Left 03/22/2016  Procedure: LEFT CARPAL TUNNEL RELEASE;  Surgeon: Kathrene Bongo, MD;  Location: [facility];  Service: Orthopedics;  Laterality: Left;   ??? WRIST FRACTURE SURGERY Left 11/13/2015    Procedure: OPEN REDUCTION INTERNAL FIXATION LEFT DISTAL RADIUS FRACTURE;  Surgeon: Kathrene Bongo, MD;  Location: [facility];  Service: Orthopedics;  Laterality: Left;       Social History     Occupational History   ??? Not on file   Tobacco Use   ??? Smoking status: Never   ??? Smokeless tobacco: Never   Vaping Use   ??? Vaping Use: Never used   Substance and Sexual Activity   ??? Alcohol use: Yes     Alcohol/week: 7.0 standard drinks     Types: 7 Cans of beer per week   ??? Drug use: No     Comment: 12-23-2009   ??? Sexual activity: Yes     Partners: Male        reports that she has never smoked. She has never used smokeless tobacco.    Family History   Problem Relation Age of Onset   ??? Stroke Mother    ??? Melanoma Neg Hx        Physical Exam     Vital Signs:  Ht 5' 5 (1.651 m)    Wt (!) 96 lb (43.5 kg)    BMI 15.98 kg/m??   BMI: Body mass index is 15.98 kg/m??.    General appearance: healthy, alert, no distress, appropriate for stated age  HEENT:  normocephalic, atraumatic  Respiratory exam: Breathing easy and unlabored on room air  Cardiovascular: Extremities warm and well perfused with brisk capillary refill, no significant edema.  Lymphatic: No obvious lymphadenopathy  Neuro: Alert and oriented to person, place, time, and situation. No focal, motor, or sensory deficit except as noted below.  Psychiatric: Mood and affect normal and appropriate     Upper Extremity Exam     On examination of the right wrist, there is no tenderness over the distal radius.  There is some mild ulnar foveal tenderness and the dorsal ulna is fairly prominent.  She actually has excellent range of motion with near full flexion, extension, supination, and pronation.  She does not have much pain with this.    She does have subjectively diminished sensation to the index, middle, and radial side of the ring finger compared to the thumb and small finger.  Two-point discrimination is 4 mm in all of the fingers except the index finger, where it is 5 mm.  Tinel test, Durkan's compression test, and Phalen test are all positive.    Other Findings:    Skin:  Normal appearance and texture, consistent with age. No rashes. No significant lesions or abnormalities.    Vascular: All fingertips are pink with good capillary refill and of normal temperature.  No edema.  No areas of ischemia or ulcers. Radial pulse 2+.    Joint Stability: No obvious dislocation, subluxation or significant laxity in either upper extremity.    Neurovascular: All of the tendons appear intact. AIN/PIN/Uln motor intact. Sensation grossly intact in the median, radial, and ulnar nerve distributions.    Radiographs & Imaging     X-rays of the right wrist dated today were reviewed independently and discussed with the patient.  The films revealed: Stable alignment of distal radius extra-articular fracture with slight residual radial shortening and dorsal tilt, within acceptable parameters.    Other Diagnostic Data      None  Assessment     [name] is a 76 y.o. female with a healed right distal radius fracture with slight malunion and recently developing carpal tunnel syndrome    Plan     Diagnosis, prognosis, and treatment options for this worsening chronic problem were reviewed along with their risks and benefits.  Regarding the distal radius fracture, I explained that it has already healed well but there is some malalignment.  She does have some tenderness in the ulnar fovea, and this may become a problem in the future due to ulnocarpal impingement, but it is too early to tell at this point.  It may also resolve and I encouraged her to continue with all of her normal activities and continue to strengthen the wrist.  For the carpal tunnel syndrome, this is just developed recently and will have to be watched closely.  Many of these cases do resolve over time, but I would like to keep a close eye on it to make sure that it does not worsen.  I will follow-up with her in 1 month to reassess the status of this.  She will continue to do night splinting during this time.  All questions were answered in the office today.            Keenan Bachelor, MD, MS, FRCS(C)  Hand Surgeon    Assistant Professor  Department of Orthopaedic Surgery and Sports Medicine  Tel: [phone]      Please note that some or all of this record was generated using voice recognition software.If there are any questions about the content of this document, please contact me as some errors in transcription may have occurred.

## 2021-09-15 ENCOUNTER — Other Ambulatory Visit: Payer: Self-pay | Admitting: Specialist

## 2021-09-15 DIAGNOSIS — R1310 Dysphagia, unspecified: Secondary | ICD-10-CM

## 2021-09-22 ENCOUNTER — Other Ambulatory Visit: Payer: Self-pay | Admitting: Family Medicine

## 2021-09-22 DIAGNOSIS — R1310 Dysphagia, unspecified: Secondary | ICD-10-CM

## 2021-09-27 ENCOUNTER — Ambulatory Visit: Admit: 2021-09-27 | Discharge: 2021-09-27 | Payer: MEDICARE

## 2021-09-27 ENCOUNTER — Ambulatory Visit
Admit: 2021-09-27 | Discharge: 2021-09-27 | Payer: MEDICARE | Attending: Student in an Organized Health Care Education/Training Program

## 2021-09-27 ENCOUNTER — Other Ambulatory Visit: Admit: 2021-09-27 | Payer: MEDICARE

## 2021-09-27 DIAGNOSIS — Z0000 Encounter for general adult medical examination without abnormal findings: Secondary | ICD-10-CM

## 2021-09-27 LAB — COMPREHENSIVE METABOLIC PANEL, SERUM
ALT: 10 U/L (ref 7–52)
AST (SGOT): 20 U/L (ref 13–39)
Albumin: 4.1 g/dL (ref 3.5–5.7)
Alkaline Phosphatase: 89 U/L (ref 36–125)
Anion Gap: 6 mmol/L (ref 3–16)
BUN: 12 mg/dL (ref 7–25)
CO2: 30 mmol/L (ref 21–33)
Calcium: 10 mg/dL (ref 8.6–10.3)
Chloride: 96 mmol/L — ABNORMAL LOW (ref 98–110)
Creatinine: 0.75 mg/dL (ref 0.60–1.30)
EGFR: 82
Glucose: 71 mg/dL (ref 70–100)
Osmolality, Calculated: 272 mosm/kg — ABNORMAL LOW (ref 278–305)
Potassium: 5.8 mmol/L — ABNORMAL HIGH (ref 3.5–5.3)
Sodium: 132 mmol/L — ABNORMAL LOW (ref 133–146)
Total Bilirubin: 0.6 mg/dL (ref 0.0–1.5)
Total Protein: 7.4 g/dL (ref 6.4–8.9)

## 2021-09-27 LAB — CBC
Hematocrit: 43.2 % (ref 35.0–45.0)
Hemoglobin: 14.9 g/dL (ref 11.7–15.5)
MCH: 33.6 pg — ABNORMAL HIGH (ref 27.0–33.0)
MCHC: 34.6 g/dL (ref 32.0–36.0)
MCV: 97.3 fL (ref 80.0–100.0)
MPV: 7.1 fL — ABNORMAL LOW (ref 7.5–11.5)
Platelets: 213 10*3/uL (ref 140–400)
RBC: 4.44 10*6/uL (ref 3.80–5.10)
RDW: 12.1 % (ref 11.0–15.0)
WBC: 5.4 10*3/uL (ref 3.8–10.8)

## 2021-09-27 LAB — LIPID PANEL
Cholesterol, Total: 208 mg/dL — ABNORMAL HIGH (ref 0–200)
HDL: 74 mg/dL (ref 60–92)
LDL Cholesterol: 114 mg/dL
Non-HDL Cholesterol, Calculated: 134 mg/dL — ABNORMAL HIGH (ref 0–129)
Triglycerides: 98 mg/dL (ref 10–149)

## 2021-09-27 LAB — VITAMIN D 25 HYDROXY: Vit D, 25-Hydroxy: 35.2 ng/mL (ref 30.0–100.0)

## 2021-09-27 LAB — HEMOGLOBIN A1C: Hemoglobin A1C: 5.2 % (ref 4.0–5.6)

## 2021-09-27 NOTE — Unmapped (Addendum)
Orthopaedics and Sports Medicine  Hand and Upper Extremity Surgery    Keenan Bachelor, MD, MS, FRCS(C)    [name]  09/27/2021    The patient is seen at the request of Referral, Self.    CHIEF COMPLAINT     [name] is a 76 y.o. female seen in follow-up regarding a right distal radius fracture sustained on??06/06/21, being treated non-operatively with associated carpal tunnel syndrome.    Chief Complaint   Patient presents with   ??? Establish Care     Right wrist f/u          History of Present Illness     [name] has been attending therapy and using a carpal tunnel splint at night.  She continues to have some pain and stiffness in her right wrist, although this is improving.  It does interfere somewhat with her ability to play violin, but she has been able to get back to this.  She is more bothered by the numbness and tingling that continues to be present in her thumb, index, middle, and ring fingers.  Although the carpal tunnel splint helps, it does not entirely prevent her from waking up.  She is also concerned about some deformity at her wrist from the prominent distal ulna.  However she admits that this area is no longer particularly tender.  She is supposed to undergo evaluation for a possible right rotator cuff tear, and hip and knee arthritis in the near future as well.    Allergies     Allergies   Allergen Reactions   ??? Iodine Anaphylaxis, Hives and Shortness Of Breath     IVP dye for a kidney function test greater than 20 years ago   ??? Iodine And Iodide Containing Products Anaphylaxis     Respiratory Distress  IVP dye for a kidney function test greater than 20 years ago  Respiratory Distress   ??? Iodinated Contrast Media      Shortness of breath, hives.   ??? Shellfish Containing Products      Advised to avoid shellfish due to allergy to iodine.       Home Medications     Current Outpatient Medications   Medication Sig   ??? alendronate Take 1 tablet (35 mg total) by mouth every 7 days.    ??? ibuprofen Take 1 tablet (200 mg total) by mouth daily as needed for Pain. Takes few times a week due to neck pain and occas bedtime     Current Facility-Administered Medications   Medication Dose Frequency Provider Last Admin   ??? BUPivacaine HCl  2 mL Once Duffy Bruce, [place]     ??? lidocaine  2 mL Once [address] [place], [place]     ??? triamcinolone acetonide  80 mg Once Duffy Bruce, [place]         Past Medical, Surgical, & Social History     Past Medical History:   Diagnosis Date   ??? Carpal tunnel syndrome on left 03/18/2016    Added automatically from request for surgery [id number]   ??? Closed fracture of upper end of tibia 12/18/2009   ??? History of fall     fell 10/2015   ??? Low back pain    ??? Osteoarthritis    ??? Osteoporosis    ??? Urticaria    ??? Varicella        Past Surgical History:   Procedure Laterality Date   ??? CESAREAN SECTION  1985   ???  HARDWARE REMOVAL  8/09    of left tibia plateau   ??? LEG SURGERY  05/09/06    fractured left tibia plateau   ??? RELEASE CARPAL TUNNEL Left 03/22/2016    Procedure: LEFT CARPAL TUNNEL RELEASE;  Surgeon: Kathrene Bongo, MD;  Location: [facility];  Service: Orthopedics;  Laterality: Left;   ??? WRIST FRACTURE SURGERY Left 11/13/2015    Procedure: OPEN REDUCTION INTERNAL FIXATION LEFT DISTAL RADIUS FRACTURE;  Surgeon: Kathrene Bongo, MD;  Location: [facility];  Service: Orthopedics;  Laterality: Left;       Social History     Occupational History   ??? Not on file   Tobacco Use   ??? Smoking status: Never   ??? Smokeless tobacco: Never   Vaping Use   ??? Vaping Use: Never used   Substance and Sexual Activity   ??? Alcohol use: Yes     Alcohol/week: 7.0 standard drinks     Types: 7 Cans of beer per week   ??? Drug use: No     Comment: 12-23-2009   ??? Sexual activity: Yes     Partners: Male        reports that she has never smoked. She has never used smokeless tobacco.    Family History   Problem Relation Age of Onset   ??? Stroke Mother    ??? Melanoma Neg Hx        Physical Exam     Vital Signs:  Ht 5' 4  (1.626 m)    Wt (!) 96 lb (43.5 kg)    BMI 16.48 kg/m??   BMI: Body mass index is 16.48 kg/m??.    General appearance: healthy, alert, no distress, appropriate for stated age  HEENT: normocephalic, atraumatic  Respiratory exam: Breathing easy and unlabored on room air  Cardiovascular: Extremities warm and well perfused with brisk capillary refill, no significant edema.  Lymphatic: No obvious lymphadenopathy  Neuro: Alert and oriented to person, place, time, and situation. No focal, motor, or sensory deficit except as noted below.  Psychiatric: Mood and affect normal and appropriate       Right Upper Extremity Exam     Skin:  Normal appearance and texture, consistent with age. No rashes. No significant lesions or abnormalities.  ??  Vascular: All fingertips are pink with good capillary refill and of normal temperature.  No edema.  No areas of ischemia or ulcers. Radial pulse 2+.  ??  Joint Stability: No obvious dislocation, subluxation or significant laxity in either upper extremity.  Limited flexion and extension of the wrist as expected. She has 45 degrees of flexion and extension.  Mild deformity of the distal forearm with prominent ulnar head.    Neurovascular: All of the tendons appear intact. AIN/PIN/Uln motor intact. Sensation grossly intact in the median, radial, and ulnar nerve distributions.  Positive Durkan's compression test and Tinel test over the carpal tunnel. No subjective sensation loss.    Squarring of the right thumb CMC joint with point tenderness and positive grind test.    Assessment     [name] is a 76 y.o. female seen in follow-up regarding a right distal radius fracture sustained on??06/06/21. She is recovering as expected from the distal radius fracture in terms of improving motion and functionality. She does have some residual deformity. She also has carpal tunnel syndrome that continues to be symptomatic with incomplete relief with splinting.    Plan     For carpal tunnel syndrome,  diagnosis, prognosis, and  treatment options for this worsening chronic problem were reviewed. Conservative treatments including activity modification, hand therapy, splinting and corticosteroid injection into the carpal tunnel were discussed. Risks and benefits of carpal tunnel release were discussed with the patient.  Risks of surgery in general include bleeding, infection, pain, scarring, stiffness, need for further surgery, risks of anesthesia (up to and including death), nerve injury, vessel injury, and chronic pain, among others. Specific risks to this procedure were also discussed, including scar tenderness, pillar pain, incomplete nerve healing, nerve injury and others.     Patient would like to speak with her other orthopedic surgeon about other joint pain including the shoulders and the knees since she believes those problems are more bothersome- and would like to get them sorted first.    she will follow-up after she is seen by her other orthopedic surgeon for reevaluation and made a decision as to wether she prefer surgery or is willing to try steroid injection or carpal tunnel release.  All questions were answered in the office today.           Please note that some or all of this record was generated using voice recognition software.If there are any questions about the content of this document, please contact me as some errors in transcription may have occurred.          I have independently examined and evaluated the patient on 09/27/2021. I have discussed the management plan with the fellow and agree with the documentation and plan as I have edited above.    Keenan Bachelor, MD, MS, FRCS(C)  Hand Surgeon    Assistant Professor  Department of Orthopaedic Surgery and Sports Medicine  Tel: [phone]

## 2021-09-27 NOTE — Unmapped (Signed)
[facility]  [facility] OFFICE  [address]  [place] [address]    Name:  [name] Date of Birth: Jun 22, 1945 (76 y.o.)   MRN: [id number]    Date of Service:  09/27/2021       Subjective:     History of Present Illness:   [name] is a(n) 76 y.o. female here today for annual wellness exam.     I have reviewed the Health Risk Assessment Questionnaire completed by patient including depression and alcohol screening.  Comments: none    Depression screening: performed, negative  Alcohol misuse screening: performed, negative  Memory Screening  No concerns per patient/family      Other HPI / Medical issues or concerns for this visit:  HPI    76yo female with history of osteoporosis here for AWV    -she retired on 02/15/21  -formerly a Personal assistant for [employer]  -various orthopedic problems - recent distal radius fracture and rotator cuff of R shoulder  -history of osteoporosis. On alendronate weekly and followed by ob for this. She is planning on getting repeat DEXA spring of 2024.    The following portions of the patient's history were reviewed and updated as appropriate:  past medical history, past surgical history, current medications, allergies, social history, family history and immunization history    Past Medical History:   Diagnosis Date   ??? Carpal tunnel syndrome on left 03/18/2016    Added automatically from request for surgery [id number]   ??? Closed fracture of upper end of tibia 12/18/2009   ??? History of fall     fell 10/2015   ??? Low back pain    ??? Osteoarthritis    ??? Osteoporosis    ??? Urticaria    ??? Varicella          Patient Care Team:  [name], MD as PCP - General (Internal Medicine)  Freddrick March, MD as Consulting Physician (Obstetrics and Gynecology)    Review of Systems   Constitutional: Negative for activity change, appetite change, chills, diaphoresis, fatigue, fever, weight gain and weight loss.   HENT: Negative for ear pain,  facial swelling, hearing loss, sinus pressure and trouble swallowing.    Eyes: Negative for photophobia, pain, discharge, redness, itching and visual disturbance.   Respiratory: Negative for apnea, cough, choking, chest tightness, shortness of breath, wheezing and stridor.    Cardiovascular: Negative for chest pain, palpitations and leg swelling.   Gastrointestinal: Negative for abdominal distention, abdominal pain, blood in stool, constipation, diarrhea, heartburn, nausea and vomiting.   Genitourinary: Negative for difficulty urinating, dysuria, flank pain, frequency and hematuria.   Musculoskeletal: Positive for arthralgias. Negative for back pain, gait problem, joint swelling, myalgias, neck pain and neck stiffness.   Skin: Negative for color change, pallor, rash and wound.   Neurological: Negative for dizziness, tremors, seizures, syncope, facial asymmetry, speech difficulty, weakness, light-headedness, numbness and headaches.   Hematological: Negative for adenopathy. Does not bruise/bleed easily.   Psychiatric/Behavioral: Negative for agitation, behavioral problems, decreased concentration, dysphoric mood and sleep disturbance. The patient is not nervous/anxious and is not hyperactive.    All other systems reviewed and are negative.           Objective:     Vitals:    09/27/21 0941   Weight: (!) 96 lb 12.8 oz (43.9 kg)   Height: 5' 4.25 (1.632 m)     Body mass index is  16.49 kg/m??.    Vision/hearing screen:No results found.    Physical Exam  Vitals reviewed.   Constitutional:       Appearance: She is well-developed.   HENT:      Head: Normocephalic and atraumatic.      Nose: Nose normal.      Mouth/Throat:      Pharynx: No oropharyngeal exudate.   Eyes:      General:         Right eye: No discharge.         Left eye: No discharge.      Conjunctiva/sclera: Conjunctivae normal.      Pupils: Pupils are equal, round, and reactive to light.   Neck:      Thyroid: No thyromegaly.   Cardiovascular:      Rate and  Rhythm: Normal rate and regular rhythm.      Heart sounds: Normal heart sounds. No murmur heard.    No friction rub. No gallop.   Pulmonary:      Effort: Pulmonary effort is normal. No respiratory distress.      Breath sounds: Normal breath sounds. No wheezing or rales.   Chest:      Chest wall: No tenderness.   Abdominal:      General: Bowel sounds are normal. There is no distension.      Palpations: Abdomen is soft. There is no mass.      Tenderness: There is no abdominal tenderness. There is no guarding or rebound.   Musculoskeletal:         General: No tenderness. Normal range of motion.      Cervical back: Normal range of motion and neck supple.   Skin:     General: Skin is warm and dry.      Findings: No erythema or rash.   Neurological:      Mental Status: She is alert and oriented to person, place, and time.      Cranial Nerves: No cranial nerve deficit.   Psychiatric:         Behavior: Behavior normal.         Thought Content: Thought content normal.         Judgment: Judgment normal.         General appearance - alert, well appearing, and in no distress  Eyes - pupils equal and reactive, extraocular eye movements intact  Mouth - mucous membranes moist, pharynx normal without lesions  Neck - supple, no significant adenopathy, no thyromegaly, no carotid bruit.  Chest - clear to auscultation, no wheezes, rales or rhonchi, symmetric air entry   Heart - normal rate, regular rhythm, normal S1, S2, no murmurs, rubs, clicks or gallops  Abdomen - soft, nontender, nondistended, no masses or organomegaly  Back exam - full range of motion  Neurological - alert, oriented, normal speech  Extremities - peripheral pulses normal, no pedal edema           Assessment/Plan:     [name] is a 76 y.o. here for Annual Wellness Visit    Patient Counseling and Personalized Prevention Plan    Risk Factors and Conditions Identified:   No additional risk factors or conditions identified.      Recommended Interventions or  Referrals:  Orders Placed This Encounter   ??? alendronate (FOSAMAX) 35 MG tablet        Discussion of advance directives:  Advance directives were not dicussed per patient request/patient declined. not needed  Discussion:  Discussed ADL support.  Diet counseling    Exercise counseling     Discussed psychosocial needs.  Discussed prevention of chronic disease and screening schedule reviewed.  Management and coordination of services     Goals    None         Health Maintenance   Upcoming Health Maintenance     Immunization: Zoster (1 of 2)  Overdue - never done    Immunization: DTaP/Tdap/Td (1 - Tdap)  Overdue since 04/06/2004    Immunization: Pneumococcal (1 - PCV)  Overdue - never done    Osteoporosis Monitoring (DXA Scan) (Every 2 Years)  Overdue since 08/07/2015    Alcohol Misuse Screening (Yearly)  Overdue since 06/24/2020    Depression Screening (Yearly)  Overdue since 06/24/2020    Annual Medicare Wellness Visit (Yearly)  Next due on 09/28/2022    Colorectal Cancer Screening (MyChart)   Discontinued          Additional Medical Problems addressed today    1. Routine health maintenance    2. Lumbar radiculopathy    3. Osteoporosis, unspecified osteoporosis type, unspecified pathological fracture presence       1. HM  -labs  -advised to ask her her insurance company about PCV-20 and shingles vaccine    2. Osteoporosis  -on alendronate  -managed by her ob/gyn    RTC 1 year                  [name], MD

## 2021-09-27 NOTE — Unmapped (Deleted)
[name]??is a 76 y.o.??female??seen in??follow-up??regarding a right distal radius fracture sustained on??06/06/21, being treated non-operatively. She developed right carpal tunnel syndrome just over 1 month ago.

## 2021-09-27 NOTE — Unmapped (Addendum)
Ask insurance about Prevnar-20 (pneumonia) and Shingrix (shingles) vaccine      Annual Wellness Visit Health Risk Assessment and Personalized Prevention Plan      Current Status of Health and Risk Factor Screening:  Cancer Screening             Discontinued - Colorectal Cancer Screening (MyChart)  Discontinued        Frequency changed to Never automatically (Topic No Longer Applies)    01/01/2002  HM Colonoscopy component of HM COLONOSCOPY    01/01/2002  HM COLONOSCOPY                  Lab Results   Component Value Date    LDL 103 06/25/2019    LDL 469 12/30/2009    LDL 629 12/30/2009    LDL 528 12/30/2009     BP Readings from Last 3 Encounters:   06/25/19 100/68   10/12/17 128/70   05/02/16 96/62       Recommended Screening Schedule for Next 3 Years:    Screening  Test When to Do It   Screening Mammography Annually or Every Other Year   Pelvic Exam and Pap Smear Not needed unless symptoms.   Colonoscopy Not needed   Bone Density Scan Spring 2024     Additional Advice:

## 2021-09-30 ENCOUNTER — Inpatient Hospital Stay: Admit: 2021-09-30 | Discharge: 2021-10-04 | Payer: MEDICARE

## 2021-09-30 ENCOUNTER — Ambulatory Visit: Admit: 2021-09-30 | Payer: MEDICARE

## 2021-09-30 DIAGNOSIS — M25562 Pain in left knee: Secondary | ICD-10-CM

## 2021-09-30 NOTE — Unmapped (Signed)
[name] RakersLois [name] is here to follow-up regarding her left knee.  She has a prior tibial plateau fracture that was treated by myself a few years back.  Subsequently she has hardware removal and treatment of her posterior arthritis with multiple injection.  Her last injection was in August of last year.  Over the winter she sustained a fall and resulting in a wrist fracture because of her left knee pain.  The left knee is irritating enough that affecting her ability to walk she also have some pain in left hip is as well because of compensating.    On exam there is 10 to 15 degrees valgus alignment to the left knee.  There is significant crepitance with range of motion as well.  There is atrophy of the both quad.    X-ray shows valgus alignment to the left knee with evidence of post traumatic arthritis.  There is osteopenia of both lower extremities.    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    Plan we recommended a left total knee replacement.  She would need to have stem fixation to support the previous fracture.  Risk assessments were discussed and she is a non-smoker no diabetes and has her teeth cleaned recently.  Her biggest risk factor would be of her osteoporosis

## 2021-10-05 ENCOUNTER — Ambulatory Visit
Admission: RE | Admit: 2021-10-05 | Discharge: 2021-10-05 | Disposition: A | Discharge status: Home / Self Care | Payer: Medicare (Managed Care) | Source: Ambulatory Visit | Attending: Family Medicine | Admitting: Family Medicine

## 2021-10-05 ENCOUNTER — Other Ambulatory Visit: Admit: 2021-10-05 | Payer: MEDICARE

## 2021-10-05 DIAGNOSIS — R1310 Dysphagia, unspecified: Secondary | ICD-10-CM | POA: Insufficient documentation

## 2021-10-05 DIAGNOSIS — I700 Atherosclerosis of aorta: Secondary | ICD-10-CM | POA: Insufficient documentation

## 2021-10-05 DIAGNOSIS — I2510 Atherosclerotic heart disease of native coronary artery without angina pectoris: Secondary | ICD-10-CM | POA: Insufficient documentation

## 2021-10-05 DIAGNOSIS — K449 Diaphragmatic hernia without obstruction or gangrene: Secondary | ICD-10-CM | POA: Diagnosis not present

## 2021-10-05 DIAGNOSIS — J90 Pleural effusion, not elsewhere classified: Secondary | ICD-10-CM | POA: Insufficient documentation

## 2021-10-05 DIAGNOSIS — E871 Hypo-osmolality and hyponatremia: Secondary | ICD-10-CM

## 2021-10-05 LAB — BASIC METABOLIC PANEL
Anion Gap: 10 mmol/L (ref 3–16)
BUN: 17 mg/dL (ref 7–25)
CO2: 25 mmol/L (ref 21–33)
Calcium: 9.5 mg/dL (ref 8.6–10.3)
Chloride: 96 mmol/L — ABNORMAL LOW (ref 98–110)
Creatinine: 0.79 mg/dL (ref 0.60–1.30)
EGFR: 77
Glucose: 120 mg/dL — ABNORMAL HIGH (ref 70–100)
Osmolality, Calculated: 275 mosm/kg — ABNORMAL LOW (ref 278–305)
Potassium: 5.3 mmol/L (ref 3.5–5.3)
Sodium: 131 mmol/L — ABNORMAL LOW (ref 133–146)

## 2021-10-05 NOTE — Unmapped (Signed)
Addended by: Nelle Don D on: 10/06/2021 12:43 PM     Modules accepted: Orders

## 2021-10-08 ENCOUNTER — Other Ambulatory Visit: Admit: 2021-10-08 | Payer: MEDICARE

## 2021-10-08 DIAGNOSIS — E871 Hypo-osmolality and hyponatremia: Secondary | ICD-10-CM

## 2021-10-08 LAB — BASIC METABOLIC PANEL
Anion Gap: 6 mmol/L (ref 3–16)
BUN: 14 mg/dL (ref 7–25)
CO2: 29 mmol/L (ref 21–33)
Calcium: 9.5 mg/dL (ref 8.6–10.3)
Chloride: 96 mmol/L — ABNORMAL LOW (ref 98–110)
Creatinine: 0.78 mg/dL (ref 0.60–1.30)
EGFR: 78
Glucose: 99 mg/dL (ref 70–100)
Osmolality, Calculated: 273 mosm/kg — ABNORMAL LOW (ref 278–305)
Potassium: 4.5 mmol/L (ref 3.5–5.3)
Sodium: 131 mmol/L — ABNORMAL LOW (ref 133–146)

## 2021-10-08 LAB — OSMOLALITY, URINE: Osmolality, Ur: 440 mosm/kg (ref 50–1200)

## 2021-10-08 LAB — CORTISOL: Cortisol: 18 ug/dL

## 2021-10-08 LAB — THYROID FUNCTION CASCADE: TSH: 3.92 u[IU]/mL (ref 0.45–4.12)

## 2021-10-08 LAB — SODIUM, URINE, RANDOM: Sodium, Ur: 62 mmol/L

## 2021-10-08 NOTE — Unmapped (Signed)
Addended by: Nelle Don D on: 10/08/2021 05:52 PM     Modules accepted: Orders

## 2021-10-11 NOTE — Unmapped (Signed)
Patient called, has decided to proceed with left knee surgery. Informed her I would have nurse navigator, Fuller Mandril, RN reach out to patient at [phone] to screen.

## 2021-10-12 NOTE — Unmapped (Signed)
Left message for patient to return call to direct line to discuss screening for surgery with Dr. Le.

## 2021-10-12 NOTE — Unmapped (Signed)
SURGERY SCREENING - TOTAL JOINTS    Patient:  [name]  MRN:  [id number]    DOB:  1945/12/04  Age:  76 y.o.    Phone:  [phone]      BMI:  16.6  (>35 no SDS)      Surgeon:  Dr. Eldred Manges     Joint:  Knee   Side:  Left    Hospital:  [facility]  Current Pain Level:  9/10    PCP:  [name], MD     Pain Mgmt Physician: none    Allergies:  Metals? no    Tobacco Use:  No    Types:  none    Marijuana or other recreational drugs:  no    Alcohol consumption:  social drinker    Dental:  Last dental visit:  5 weeks ago   If >49yr. do not schedule surgery.  Needs to see Dentist.     Any broken decaying, or abscessed teeth or generalized mouth pain:  no    Diabetic:  no  Last A1c:   Hemoglobin A1C   Date Value Ref Range Status   09/27/2021 5.2 4.0 - 5.6 % Final     Comment:     Hemoglobin A1c Interpretation Guidelines:  Normal:     <5.7%  Prediabetes: 5.7-6.4%  Diabetes:   >6.4%  Diagnosis requires two independent tests unless clinical diagnosis is clear.     Some clinical conditions, particularly anemias and hemoglobinopathies, may interfere with the diagnostic accuracy of hemoglobin A1c.     The recommended goal for diabetic glycemic control (Hemoglobin A1c <7.0%) should be individualized based on duration of diabetes, age/life expectancy, comorbid conditions, known CVD or advanced microvascular complications, hypoglycemia unawareness, and other individual patient considerations.         Cardiac History:    HTN  []  Yes  [x]   No        HLD      []  Yes [x]  No  CAD  []  Yes  [x]   No        High Cholesterol     []  Yes [x]  No  CHF  []  Yes  [x]   No        Afib       []  Yes [x]  No  MI  []  Yes  [x]   No        Murmur      []  Yes [x]  No  PPM   []  Yes  [x]   No        Defibrilator     []  Yes [x]  No  Stents   []  Yes  [x]   No        CABG       []  Yes [x]  No                                                         Valve disease/valve replacement  []  Yes [x]  No                                                          Vascular/Peripheral/Arterial Disease []  Yes [  x] No                                                            Other     []  Yes [x]  No     Cardiologist:  None       Anticoagulation:     [x]   None    []   Coumadin/Warfarin []   ASA 81 mg    []   Plavix   []   ASA 325 mg    []   Eliquis   []   Pradaxa    []   Lovenox   []   Xarelto    []   Arixtra    []   Aggrenox    Notify patient that they may be on injectable blood thinner once DC from hospital    Pulmonary History:    Asthma []  Yes  [x]   No        Emphysema  []  Yes [x]  No  COPD  []  Yes  [x]   No        Pulmonary HTN  []  Yes [x]  No  Sarcoidosis []  Yes  [x]   No        Pulmonary fibrosis []  Yes [x]  No  TB  []  Yes  [x]   No        Blood clot   []  Yes [x]  No  Sleep Apnea []  Yes  [x]   No        Other   []  Yes [x]  No    Inhalers:  no     Sleep Apnea:  no      Neuro History:      CVA  []  Yes  [x]   No        TIA   []  Yes [x]  No  Seizure []  Yes  [x]   No        Head trauma  []  Yes [x]  No  MS  []  Yes  [x]   No        Migraine Headache  []  Yes [x]  No  Other  []  Yes  [x]  No    GI History:    History of post op ilieus   []  Yes  [x]   No        Hepatitis  []  Yes [x]  No  Liver disease/Cirrhosis  []  Yes  [x]   No        Reflux/GERD [x]  Yes []  No  History of post op nausea/vomiting  []  Yes  [x]   No        Other  []  Yes [x]  No      GU History:    Chronic kidney disease-stage 1  []  Yes  [x]   No        ESRD   []  Yes [x]  No  Chronic kidney disease-stage 2  []  Yes  [x]   No        BPH   []  Yes [x]  No  Chronic kidney disease-stage 3  []  Yes  [x]   No        Frequent UTI  []  Yes [x]  No  Dialysis-peritoneal/hemodialysis []  Yes  [x]   No        Urinary retention  []  Yes [x]  No  Other     []  Yes  [x]   No      Blood Disorders:  Chronic anemia (Hgb <10g/dL) []  Yes  [x]   No        Sickle cell  []  Yes [x]  No  Clotting disorder   []  Yes  [x]   No        Leukemia  []  Yes [x]  No  Thrombocytopenia (platelets <75) []  Yes  [x]   No        HIV/AIDS  []  Yes [x]  No  Other     []  Yes  [x]  No    Orthopaedic  History:    Spinal stenosis []  Yes  [x]   No        Prior hip joint replacement []  Yes [x]  No  Low back pain  []  Yes  [x]   No        Prior knee joint replacement []  Yes [x]  No  Other osteoporosis   [x]  Yes  []  No    Other History:      Lupus        []  Yes  [x]   No        Rheumatoid arthritis    []  Yes [x]  No  Chronic steroid use      []  Yes  [x]   No        Cancer of any type    []  Yes [x]  No  Chronic pressure ulcers []  Yes  [x]   No        Family history of malignant hyperthermia  []  Yes [x]  No  Other       []  Yes  [x]  No    Psychosocial History (Hx of Mental Illness):      Anxiety  []  Yes  [x]   No        Depression   []  Yes [x]  No  Bipolar  []  Yes  [x]   No        Schizoaffective disorder  []  Yes [x]  No  Other  []  Yes  [x]   No      Home:  lives alone    Family support (name):  [name]     Relationship:  daughter  Contact information: In Epic    ADLs:  without assistance    Currently using HHC agency:  no     Patient does not have a preference with a home care agency so referral made to, and accepted by,  [facility] per physician preference.                                               Transportation:  yes                            Hospitalization in last 6 months:  no                              History of falls in last 6 months:  yes - Last Fall:  Jun 06, 2021 fx right wrist in fall                     Admission to SNF in last 6 months:  no     Assistive devices at home:  none    Prescription for assistive devices given:  none    Can patient ambulate without assistive device:  yes

## 2021-10-18 MED ORDER — lidocaine (PF) 2% (20 mg/mL) Soln 20 mg
20 | Freq: Once | INTRAMUSCULAR | Status: AC | PRN
Start: 2021-10-18 — End: 2021-10-18

## 2021-10-21 NOTE — Unmapped (Signed)
10/18/21 0001   Pre-op Phone Call   Surgery Time Verified Yes  (11/09/21 at 1330)   Arrival Time Verified 1130   Surgery Location Verified Yes  [facility])   Remind patient to bring picture ID and insurance card Yes   Medical History Reviewed Yes   NPO Status Reinforced Yes  (862)   Ride and Caregiver Arranged Yes   Ride Caregiver Provider Devonne, Kitchen (Daughter)   [phone] (Mobile)   Instructions to bring current medication list No     Reviewed Pre-Op instructions with patient. No hx of problems with anesthesia. Reviewed fasting guidelines.Avoid NSAIDS including ASA, Advil, Ibuprofen, Aleve, or Naproxen from now until procedure. Ok to take Acetaminophen. Do not take a Multivitamin or Herbal Supplements two weeks prior to surgery (If the surgery date is less than 2 weeks, please stop taking from now until the surgery date).  Reviewed med list. No medications to take AM of OR. Informed of need for labs/teaching to be scheduled with [facility]. Msg sent to [facility]. Patient voiced understanding of all instructions. My Chart message sent with preop instructions.

## 2021-10-26 ENCOUNTER — Ambulatory Visit: Admit: 2021-10-26 | Discharge: 2021-10-26 | Payer: MEDICARE

## 2021-10-26 ENCOUNTER — Institutional Professional Consult (permissible substitution): Admit: 2021-10-26 | Payer: MEDICARE

## 2021-10-26 DIAGNOSIS — M1732 Unilateral post-traumatic osteoarthritis, left knee: Secondary | ICD-10-CM

## 2021-10-26 DIAGNOSIS — Z01818 Encounter for other preprocedural examination: Secondary | ICD-10-CM

## 2021-10-26 LAB — BASIC METABOLIC PANEL
Anion Gap: 7 mmol/L (ref 3–16)
BUN: 11 mg/dL (ref 7–25)
CO2: 27 mmol/L (ref 21–33)
Calcium: 9.3 mg/dL (ref 8.6–10.3)
Chloride: 96 mmol/L (ref 98–110)
Creatinine: 0.75 mg/dL (ref 0.60–1.30)
EGFR: 82
Glucose: 100 mg/dL (ref 70–100)
Osmolality, Calculated: 269 mOsm/kg (ref 278–305)
Potassium: 4.2 mmol/L (ref 3.5–5.3)
Sodium: 130 mmol/L (ref 133–146)

## 2021-10-26 LAB — DIFFERENTIAL
Basophils Absolute: 29 /uL (ref 0–200)
Basophils Relative: 0.5 % (ref 0.0–1.0)
Eosinophils Absolute: 87 /uL (ref 15–500)
Eosinophils Relative: 1.5 % (ref 0.0–8.0)
Lymphocytes Absolute: 1166 /uL (ref 850–3900)
Lymphocytes Relative: 20.1 % (ref 15.0–45.0)
Monocytes Absolute: 423 /uL (ref 200–950)
Monocytes Relative: 7.3 % (ref 0.0–12.0)
Neutrophils Absolute: 4095 /uL (ref 1500–7800)
Neutrophils Relative: 70.6 % (ref 40.0–80.0)
nRBC: 0 /100 WBC (ref 0–0)

## 2021-10-26 LAB — POC URINALYSIS
Bilirubin, UA: NEGATIVE
Blood, POC, UA: NEGATIVE
Glucose, POC, UA: NEGATIVE mg/dL
Ketones, POC, UA: NEGATIVE mg/dL
Leukocytes, POC, UA: NEGATIVE
Nitrite, POC, UA: NEGATIVE
Protein, POC, UA: NEGATIVE mg/dL
Spec Grav, UA: 1.015 (ref 1.005–1.035)
Urobilinogen, POC, UA: 0.2 mg/dL (ref 0.2–1.0)
pH, POC,UA: 6.5 (ref 5.0–8.0)

## 2021-10-26 LAB — PROTEIN, TOTAL: Total Protein: 6.3 g/dL (ref 6.4–8.9)

## 2021-10-26 LAB — APTT: aPTT: 29.6 seconds (ref 25.5–35.0)

## 2021-10-26 LAB — CBC
Hematocrit: 38.7 % (ref 35.0–45.0)
Hemoglobin: 13.5 g/dL (ref 11.7–15.5)
MCH: 33.9 pg (ref 27.0–33.0)
MCHC: 34.9 g/dL (ref 32.0–36.0)
MCV: 97.1 fL (ref 80.0–100.0)
MPV: 7.2 fL (ref 7.5–11.5)
Platelets: 198 10*3/uL (ref 140–400)
RBC: 3.98 10*6/uL (ref 3.80–5.10)
RDW: 12.3 % (ref 11.0–15.0)
WBC: 5.8 10*3/uL (ref 3.8–10.8)

## 2021-10-26 LAB — ABO/RH: Rh Type: POSITIVE

## 2021-10-26 LAB — HEMOGLOBIN A1C: Hemoglobin A1C: 4.8 % (ref 4.0–5.6)

## 2021-10-26 LAB — MRSA/STAPH AUREUS DNA - PRE-SURGICAL SCREEN ONLY
MRSA, PCR: NEGATIVE
Staph Aureus, PCR: NEGATIVE

## 2021-10-26 LAB — NICOTINE(COTININE), QUAL, URINE: Cotinine, UR: NEGATIVE

## 2021-10-26 LAB — ANTIBODY SCREEN: Antibody Screen: NEGATIVE

## 2021-10-26 LAB — ALBUMIN: Albumin: 4.1 g/dL (ref 3.5–5.7)

## 2021-10-26 LAB — PROTIME-INR
INR: 1.1 (ref 0.9–1.1)
Protime: 14.2 seconds (ref 12.1–15.1)

## 2021-10-26 NOTE — Unmapped (Addendum)
Patient here today for RN visit for Labs and Teaching for CHG Shower and Nasal Swab results, prior to Left Total Knee on 11/09/21.     Home medications reconciliation reviewed with patient. Visitor policy and pain scale discussed and informational materials provided. Reviewed instructions of chloraprep shower daily x 5 days prior to surgery and the morning of surgery. Pre op instructions reviewed with patient and a written copy was provided to patient in writing on the AVS. Pt verbalized understanding of all instructions.     I spoke with pt and reviewed CHG and Nasal Swab Instructions  Labs-collected today at vist. Thanks, Lupita Leash RN    I sent a In basket message to Dr. Nelle Don (PCP) and Dr. Le--pt's (surgeon) to follow up on abnormal labs and Dr. Izola Price responded that he seen the labs and do not feel the results should delay surgery.

## 2021-10-26 NOTE — Unmapped (Signed)
[facility]  [address]  [place] [address]    Name:  [name] Date of Birth: 1945-06-12 (76 y.o.)   MRN: [id number]    Date of Service:  10/26/2021     Subjective:     Chief Complaint   Patient presents with   ??? Pre-op Exam     June 4th     History of Present Illness:  [name] is a(n) 76 y.o. female here today for the following:   HPI     76yo female with history of osteoporosis here for pre-op    Operative H&P    Type of surgery: L total knee arthroplasty  Type of anesthesia: general  Name of surgeon: Dr. Conley Rolls  Date of surgery: 11/09/2021    Surgery Risk: Low Intermediate High  Past Medical History:   Diagnosis Date   ??? Acquired absence of ovary, unilateral     Missing left ovary.   ??? Carpal tunnel syndrome on left 03/18/2016    Added automatically from request for surgery [id number]   ??? Closed fracture of upper end of tibia 12/18/2009   ??? 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   ??? History of fall     fell 10/2015   ??? Low back pain    ??? Osteoarthritis    ??? Osteoporosis    ??? Urticaria    ??? Varicella      Past Surgical History:   Procedure Laterality Date   ??? CESAREAN SECTION  1985   ??? HARDWARE REMOVAL  8/09    of left tibia plateau   ??? LEG SURGERY  05/09/06    fractured left tibia plateau   ??? RELEASE CARPAL TUNNEL Left 03/22/2016    Procedure: LEFT CARPAL TUNNEL RELEASE;  Surgeon: Kathrene Bongo, MD;  Location: [facility];  Service: Orthopedics;  Laterality: Left;   ??? WRIST FRACTURE SURGERY Left 11/13/2015    Procedure: OPEN REDUCTION INTERNAL FIXATION LEFT DISTAL RADIUS FRACTURE;  Surgeon: Kathrene Bongo, MD;  Location: [facility];  Service: Orthopedics;  Laterality: Left;     Social History     Socioeconomic History   ??? Marital status: Widowed     Spouse name: Not on file   ??? Number of children: Not on file   ??? Years of education: Not on file   ??? Highest education level: Not on file   Occupational History   ??? Not on file   Tobacco  Use   ??? Smoking status: Never   ??? Smokeless tobacco: Never   Vaping Use   ??? Vaping Use: Never used   Substance and Sexual Activity   ??? Alcohol use: Yes     Alcohol/week: 7.0 standard drinks     Types: 7 Glasses of wine per week   ??? Drug use: No     Comment: 12-23-2009   ??? Sexual activity: Yes     Partners: Male   Other Topics Concern   ??? Caffeine Use Not Asked   ??? Occupational Exposure Not Asked   ??? Exercise Not Asked   ??? Seat Belt Not Asked   Social History Narrative   ??? Not on file     Social Determinants of Health     Financial Resource Strain: Not on file   Food Insecurity: Not on file   Transportation Needs: Not on file   Intimate Partner Violence: Not on  file   Housing Stability: Not on file     Current Outpatient Medications   Medication Sig Dispense Refill   ??? alendronate (FOSAMAX) 35 MG tablet Take 1 tablet (35 mg total) by mouth every 7 days.     ??? ibuprofen (ADVIL,MOTRIN) 200 MG tablet Take 1 tablet (200 mg total) by mouth daily as needed for Pain. Takes few times a week due to neck pain and occas bedtime       Current Facility-Administered Medications   Medication Dose Route Frequency Provider Last Rate Last Admin   ??? bupivacaine (MARCAINE) 0.5 % (5 mg/mL) injection 10 mg  2 mL Subcutaneous Once Duffy Bruce, [place]       ??? lidocaine 10 mg/mL (1 %) injection 2 mL  2 mL Subcutaneous Once Duffy Bruce, [place]       ??? triamcinolone acetonide (KENALOG-40) injection 80 mg  80 mg Intra-articular Once Duffy Bruce, [place]         Lab Results   Component Value Date    WBC 5.4 09/27/2021    HGB 14.9 09/27/2021    HCT 43.2 09/27/2021    MCV 97.3 09/27/2021    PLT 213 09/27/2021     Lab Results   Component Value Date    CREATININE 0.78 10/08/2021    BUN 14 10/08/2021    NA 131 (L) 10/08/2021    K 4.5 10/08/2021    CL 96 (L) 10/08/2021    CO2 29 10/08/2021     ?  Major Predictors:  Recent PCI/MI/unstable angina: no  Decompensated HF or new onset HF: no  Arrhythmias: no  High-grade atrioventricular block     Symptomatic ventricular arrhythmias in the presence of underlying heart disease    Supraventricular arrhythmias with uncontrolled ventricular rate)   Severe heart valve disease: no     RCRI:  High-risk type of surgery (vascular surgery or any open intraperitoneal or intrathoracic procedures): no  History of ischemic heart disease (history of MI or a positive exercise test, current complaint of chest pain considered to be secondary to myocardial ischemia, use of nitrate therapy, or ECG with pathological Q waves; do not count prior coronary revascularization procedure unless one of the other criteria for ischemic heart disease is present): no  History of HF: no  History of cerebrovascular disease: no  Diabetes mellitus requiring treatment with insulin: no  Preoperative serum creatinine >2: no    RCRI score: zero    Interpretation of Risk Score   Risk class Points Risk of MACE (%)   I. Very low 0 3.9   II. Low 1 6.0   III. Moderate 2 10.1   IV. High 3 + 15.0     Functional Capacity:   Can take care of self, such as eat, dress or use the toilet (1 MET)   Can walk up a flight of steps or a hill (4 METs)   Can do heavy work around the house such as scrubbing floors or lifting or moving heavy furniture (between 4 and 10 METs)   Can participate in strenuous sports such as swimming, singles tennis, football, basketball, and skiing (>10 METs)    No family history of malignant hyperthermia. No know history of bleeding disorder. No previous history of difficulty with anesthesia.     Current Outpatient Medications   Medication Sig Dispense Refill   ??? alendronate (FOSAMAX) 35 MG tablet Take 1 tablet (35 mg total) by mouth every 7 days.     ???  ibuprofen (ADVIL,MOTRIN) 200 MG tablet Take 1 tablet (200 mg total) by mouth daily as needed for Pain. Takes few times a week due to neck pain and occas bedtime       Current Facility-Administered Medications   Medication Dose Route Frequency Provider Last Rate Last Admin   ??? bupivacaine  (MARCAINE) 0.5 % (5 mg/mL) injection 10 mg  2 mL Subcutaneous Once Duffy Bruce, [place]       ??? lidocaine 10 mg/mL (1 %) injection 2 mL  2 mL Subcutaneous Once Marissa Lindsey Baum, [place]       ??? triamcinolone acetonide (KENALOG-40) injection 80 mg  80 mg Intra-articular Once Duffy Bruce, [place]          Review of Systems   Constitutional: Negative for activity change, appetite change, chills, diaphoresis, fatigue, fever, weight gain and weight loss.   HENT: Negative for ear pain, facial swelling, hearing loss, sinus pressure and trouble swallowing.    Eyes: Negative for photophobia, pain, discharge, redness, itching and visual disturbance.   Respiratory: Negative for apnea, cough, choking, chest tightness, shortness of breath, wheezing and stridor.    Cardiovascular: Negative for chest pain, palpitations and leg swelling.   Gastrointestinal: Negative for abdominal distention, abdominal pain, blood in stool, constipation, diarrhea, heartburn, nausea and vomiting.   Genitourinary: Negative for difficulty urinating, dysuria, flank pain, frequency and hematuria.   Musculoskeletal: Negative for arthralgias, back pain, gait problem, joint swelling, myalgias, neck pain and neck stiffness.   Skin: Negative for color change, pallor, rash and wound.   Neurological: Negative for dizziness, tremors, seizures, syncope, facial asymmetry, speech difficulty, weakness, light-headedness, numbness and headaches.   Hematological: Negative for adenopathy. Does not bruise/bleed easily.   Psychiatric/Behavioral: Negative for agitation, behavioral problems, decreased concentration, dysphoric mood and sleep disturbance. The patient is not nervous/anxious and is not hyperactive.    All other systems reviewed and are negative.           Objective:     Vitals:    10/26/21 1018   BP: 98/60   Pulse: 78   SpO2: 97%   Weight: (!) 94 lb 14.4 oz (43 kg)     Body mass index is 16.29 kg/m??.        Physical Exam  Vitals reviewed.    Constitutional:       Appearance: She is well-developed.   HENT:      Head: Normocephalic and atraumatic.      Nose: Nose normal.      Mouth/Throat:      Pharynx: No oropharyngeal exudate.   Eyes:      General:         Right eye: No discharge.         Left eye: No discharge.      Conjunctiva/sclera: Conjunctivae normal.      Pupils: Pupils are equal, round, and reactive to light.   Neck:      Thyroid: No thyromegaly.   Cardiovascular:      Rate and Rhythm: Normal rate and regular rhythm.      Heart sounds: Normal heart sounds. No murmur heard.    No friction rub. No gallop.   Pulmonary:      Effort: Pulmonary effort is normal. No respiratory distress.      Breath sounds: Normal breath sounds. No wheezing or rales.   Chest:      Chest wall: No tenderness.   Abdominal:      General: Bowel sounds are normal.  There is no distension.      Palpations: Abdomen is soft. There is no mass.      Tenderness: There is no abdominal tenderness. There is no guarding or rebound.   Musculoskeletal:         General: No tenderness. Normal range of motion.      Cervical back: Normal range of motion and neck supple.   Skin:     General: Skin is warm and dry.      Findings: No erythema or rash.   Neurological:      Mental Status: She is alert and oriented to person, place, and time.      Cranial Nerves: No cranial nerve deficit.   Psychiatric:         Behavior: Behavior normal.         Thought Content: Thought content normal.         Judgment: Judgment normal.                   Assessment/Plan:   Perioperative evaluation    -she is medically optimized for surgery  -RCRI = zero  -no aspirin or NSAID's for 7 days prior to surgery                              [name], MD

## 2021-10-26 NOTE — Unmapped (Signed)
[facility]:    [address]    [place], [place] [address].   [phone]    Arrival Instructions    We're pleased that you have chosen [facility] for your upcoming procedure.  The staff serving you has been professionally trained to provide the highest quality care.  We encourage you to ask questions and to let the staff know of any special needs,as we want your visit to be as comfortable as possible.    Your procedure is scheduled on 11/09/2021 at  1330 PM.  Please arrive at 1130 AM to [facility]: the registration area in the main lobby.     Parking:  [employer]: the [employer] located at [address] (formerly [address]).  *Parking tickets can be validated at the [employer] near the [employer].   *Valet available 6:00am-6:00pm for a fee    Fasting Instructions Prior to Surgery or Procedure Requiring Sedation    IF YOUR PHYSICIAN HAS ALREADY PRESCRIBED A SPECIAL DIET OR BOWEL PREP PRIOR TO YOUR SURGERY OR PROCEDURE, PLEASE FOLLOW THE INSTRUCTIONS FROM YOUR PHYSICIAN'S OFFICE. (If times of surgery changes then the pre op diet instructions would need to be adjusted accordingly)      DO NOT EAT OR DRINK ANYTHING (including gum, mints, water, etc.) after midnight the night before your procedure.  You may brush your teeth and gargle on the morning of surgery, but do not swallow any water.  If you have morning medications to take, you may take them with small sips of water.         FAILURE TO FOLLOW YOUR DIET INSTRUCTIONS MAY LEAD TO A DELAY OR CANCELLATION OF YOUR SURGERY.    Diabetic patients: Closely monitor your blood sugar and keep in the range your doctor recommends.  If you have low blood sugar prior to hospital arrival, drink a small amount of clear sugar containing fluid such as apple juice or clear soda, but no liquids containing pulp.        Medication  Instructions  Bring a list of your current medications, along with the dose and frequency. Please include any vitamins, herbal supplements, and over the counter medications you may be taking.  Your pre op nurse will need to know when your last dose of medication was taken.    *MEDICATIONS TO TAKE MORNING OF SURGERY. Take the following medications morning of surgery with a small sip of water  None              Over-the-Counter Medication (OTC)  Instructions:    -Some over-the-counter Medications may thin your blood:  Stop taking over-the-counter blood thinners seven (7) to ten (10) days prior to your surgery.  Examples of over the counter blood thinners include:  Vitamin E and NSAIDs (Motrin, Naproxen, Aleve, Advil, Ibuprofen).      -Herbal Supplements and multivitamins should be stopped two (2) weeks prior to surgery.    -Cannabis products should be stopped two (2) weeks prior to surgery, unless you are taking cannabis to treat seizures or for medicinal purpose, then please stop on the day of surgery.    -Medication that are OK to continue through surgery: Fish oil, omege-3 fatty acids, melatonin, supplements used to treat a specific deficiency, like vitamin B12, iron, folate, calcium, magnesium or potassium.    -It is OK to take acetaminophen (Tylenol) if needed in the days leading up to surgery.  General Pre-Procedure Instructions    TRANSPORTATION ARRANGEMENTS:  You will need a responsible adult to accompany you home from the hospital.  You will not be permitted to drive, take a taxi, bus, [place] or [place] by yourself, as these do not count as reliable transportation.   You will also need someone to stay with you for the first 24 hours after anesthesia.  We will ask you to provide the name, and a working phone number for your designated contact.    VISITOR POLICY:  Same day surgery is allowing no more than (two) 2 adult visitors per patient  at any time.  Prior to having any visitors, the patient will be  checked in by the same day surgery nurse. Children under the age of 48 are not permitted to visit.  Visiting hours in other areas of the hospital will vary depending on specific unit, or floor of the hospital.  Masks are no longer required, exceptions may apply.    WHAT TO BRING:  Please bring a photo ID and insurance information.    If you are being admitted to the hospital after surgery, you may bring a small overnight bag, but please do not bring any valuables.    VALUABLES:  Please do not bring valuables such as money, jewelry or credit cards with you.    However, if you wear hearing aids, glasses, and/or dentures, bring these items along with a  case for safekeeping.    WHAT TO WEAR:  Wear casual, comfortable, loose fitting clothing.  You will be provided with a hospital gown prior to surgery.    DO NOT WEAR:  Any make-up, jewelry, body piercings, powders, perfumes/colognes, dark nail polish.    FEMALE PATIENTS:  Pre-menopausal female patients will be screened and/or tested for pregnancy prior to your procedure given the potential risk of anesthesia and surgery to a fetus.    Please do SHOWER the evening before surgery and again the morning of surgery.  If given an antiseptic soap such as Chlorhexidine (CHG), please follow those instructions for use (see below).  Simple soap, like Dial or [place], is adequate for all other surgeries. Do not shave in the area of the surgery for two (2) days prior to surgery.  If needed, a trained staff member will clip the area immediately before your surgery.      Chlorhexidine (CHG)/Surgical Scrub: If you have been provided Chlorhexidine for preop shower, you will use this soap to wash your entire body, once a day for five (5) days prior to surgery and the morning of surgery.     -Do not apply this soap to your face, hair, or genital area. You may use your regular soap in these areas.     -You may use the antibacterial lotion starting four (4) days before surgery, but do not use  any lotion the morning of surgery.    -No other lotions, ointment, powders, creams, deodorant, perfume or cologne once you start using CHG wash.     -Sleep on clean sheets the night before the procedure.     -No pets in your bed the night the procedure.       NASAL SWAB:   If you are positive for Staph, or possibly MRSA, you will need to use Bactroban/Mupirocin intranasally for 5 days prior to surgery and the morning of surgery. You will be contacted by a [facility] nurse practitioner or registered nurse and a prescription will be called in to your pharmacy. This is  used twice per day in each nostril via a Qtip.    INCISIONS:  If you have an incision after your procedure, be sure to sleep in clean sheets when you return home from surgery.  Do note allow pets to sleep in bed with you until you have a follow-up appointment with your doctor.          Day of Surgery Checklist: (can keep or delete- some duplicate info as above)    The morning of your surgery:  ? Brush your teeth.   ? Shower: the morning of surgery with an antibacterial soap, such as Dial. If given Chlorhexidine (CHG) wash or mupirocin/Bactroban nasal ointment, please use as instructed.   ? Remove: all makeup, nail polish, jewelry, wristwatch, and body piercings.  ? Do not use: powder, lotions, deodorant, perfume, and or cologne.   ? What to wear: Wear casual, loose fitting, and comfortable clothing.    Bring with you:  ? List of your medications, dosage and how often you take the medication.  ? List of over-the-counter medications and supplements, dose and how often you take the       medication.    ? Photo ID  ? Insurance card     ? Glasses and case  ? Dentures and case   ? Hearing aids and case  ? Crutches, walker, or cane if you have these items and will need them after surgery.      ? Specific medications as described above (rescue inhaler, transplant, seizure, Parkinson's and transplant medications)  ? CPAP or BiPAP machine and all associated equipment  except water  ? Remote control for any implanted device (nerve stimulator, bladder stimulator, INSPIRE, etc)  ? Other:     ? For an overnight stay - pack a small bag of items that you may need  (ex. change of clothing, toiletries, phone charger).  Locker space is limited in the surgery area.    Leave at home:  ? Valuables:  We recommend that you leave valuables (ex. money, jewelry, credit cards) at home or with your family.   ? Contact lenses: Leave at home or bring a case for safe keeping.        Additional Instructions    FEELING SICK?:  If you suspect that you have an illness/infection/rash currently, or, if you develop an illness or rash prior to surgery, please contact your surgeon immediately.    For questions please call [phone].  There is a nurse available Monday through Friday, 8 a.m. to 5:30 p.m.  The office is closed on weekends and holidays.      After hours you may leave a voicemail, and someone will return your call on the next business day.    In an EMERGENCY, if you must reach someone after our office is closed, you may call the same day surgery at [phone] from 5:30 to 8 p.m.  After that time, urgent calls only may go to the operating room desk at [phone].

## 2021-10-29 NOTE — Unmapped (Signed)
[facility] for Joint Replacement Model Notification Letter received and sent for scanning.

## 2021-11-08 MED FILL — ROPIV 2.46-EPI 0.005-CLONID 0.0008-KETOROL 0.3 MG/ML PERIARTICULR SYRG: 2.46-0.005- 2.46-0.005- 0.0008-0.3mg/mL | PERIARTICULAR | Qty: 50

## 2021-11-09 ENCOUNTER — Inpatient Hospital Stay
Admit: 2021-11-09 | Discharge: 2021-11-11 | Disposition: A | Discharge status: Home Health | Payer: MEDICARE | Source: Ambulatory Visit

## 2021-11-09 DIAGNOSIS — M1732 Unilateral post-traumatic osteoarthritis, left knee: Secondary | ICD-10-CM

## 2021-11-09 LAB — ABO/RH: Rh Type: POSITIVE

## 2021-11-09 LAB — POC GLU MONITORING DEVICE: POC Glucose Monitoring Device: 94 mg/dL (ref 70–100)

## 2021-11-09 MED ORDER — oxyCODONE (ROXICODONE) immediate release tablet 2.5 mg
5 | ORAL | PRN
Start: 2021-11-09 — End: 2021-11-11
  Administered 2021-11-10 – 2021-11-11 (×4): 2.5 mg via ORAL

## 2021-11-09 MED ORDER — propofol (DIPRIVAN) infusion 10 mg/mL
10 | INTRAVENOUS | Status: AC | PRN
Start: 2021-11-09 — End: 2021-11-09
  Administered 2021-11-09: 17:00:00 75 via INTRAVENOUS

## 2021-11-09 MED ORDER — EPINEPHrine HCl (PF) injection Soln
1 | INTRAMUSCULAR | Status: AC
Start: 2021-11-09 — End: 2021-11-09
  Administered 2021-11-09: 17:00:00 100 via INTRATHECAL

## 2021-11-09 MED ORDER — senna-docusate (SENNA-S) 8.6-50 mg per tablet 1 tablet
8.6-50 | Freq: Two times a day (BID) | ORAL | PRN
Start: 2021-11-09 — End: 2021-11-11

## 2021-11-09 MED ORDER — dexamethasone (DECADRON) injection 4 mg
4 | Freq: Once | INTRAMUSCULAR | PRN
Start: 2021-11-09 — End: 2021-11-09

## 2021-11-09 MED ORDER — tranexamic acid in NaCl,iso-os IV piggyback 1,000 mg
1000 | Freq: Once | INTRAVENOUS
Start: 2021-11-09 — End: 2021-11-09

## 2021-11-09 MED ORDER — bupivacaine (MARCAINE) injection
0.75 | INTRAMUSCULAR | Status: AC
Start: 2021-11-09 — End: 2021-11-09
  Administered 2021-11-09: 17:00:00 1.4 via INTRATHECAL

## 2021-11-09 MED ORDER — gabapentin (NEURONTIN) capsule 100 mg
100 | Freq: Two times a day (BID) | ORAL
Start: 2021-11-09 — End: 2021-11-11
  Administered 2021-11-10 – 2021-11-11 (×4): 100 mg via ORAL

## 2021-11-09 MED ORDER — ondansetron (ZOFRAN) injection 4 mg
4 | Freq: Three times a day (TID) | INTRAMUSCULAR | PRN
Start: 2021-11-09 — End: 2021-11-11
  Administered 2021-11-10: 10:00:00 4 mg via INTRAVENOUS

## 2021-11-09 MED ORDER — glucose chewable tablet 12 g
4 | ORAL | PRN
Start: 2021-11-09 — End: 2021-11-09

## 2021-11-09 MED ORDER — bupivacaine (PF)(SENSORCAINE/MARCAINE) 0.5% injection
0.5 | INTRAMUSCULAR | Status: AC
Start: 2021-11-09 — End: 2021-11-09
  Administered 2021-11-09: 16:00:00 10 via PERINEURAL

## 2021-11-09 MED ORDER — pregabalin (LYRICA) capsule 75 mg
75 | ORAL | Status: AC | PRN
Start: 2021-11-09 — End: 2021-11-09

## 2021-11-09 MED ORDER — Total Joint Mixture - RECK (ropivacaine-epinephrine-clonidine-ketorolac) syringe 50 mL
2.46-0.005- | PERIARTICULAR | Status: AC | PRN
Start: 2021-11-09 — End: 2021-11-09
  Administered 2021-11-09: 18:00:00 1 via INTRA_ARTICULAR

## 2021-11-09 MED ORDER — celecoxib (CELEBREX) 200 MG capsule
200 | ORAL | Status: AC
Start: 2021-11-09 — End: 2021-11-09
  Administered 2021-11-09: 17:00:00 400 via ORAL

## 2021-11-09 MED ORDER — propofol 10 mg/ml (DIPRIVAN) 10 mg/mL injection
10 | INTRAVENOUS | Status: AC
Start: 2021-11-09 — End: ?

## 2021-11-09 MED ORDER — peppermint oiL liquid 1 mL
Status: AC | PRN
Start: 2021-11-09 — End: 2021-11-09

## 2021-11-09 MED ORDER — lactated Ringers infusion
INTRAVENOUS
Start: 2021-11-09 — End: 2021-11-11
  Administered 2021-11-09 – 2021-11-10 (×2): 125 mL/h via INTRAVENOUS

## 2021-11-09 MED ORDER — acetaminophen (TYLENOL) 325 MG tablet
325 | ORAL | Status: AC
Start: 2021-11-09 — End: 2021-11-09
  Administered 2021-11-09: 17:00:00 975 via ORAL

## 2021-11-09 MED ORDER — HYDROmorphone (DILAUDID) injection Syrg 0.2 mg
0.5 | INTRAMUSCULAR | Status: AC | PRN
Start: 2021-11-09 — End: 2021-11-09

## 2021-11-09 MED ORDER — acetaminophen (TYLENOL) tablet 975 mg
325 | Freq: Three times a day (TID) | ORAL
Start: 2021-11-09 — End: 2021-11-11
  Administered 2021-11-10 – 2021-11-11 (×6): 975 mg via ORAL

## 2021-11-09 MED ORDER — dextrose 10%-water (D10W) IV soln
INTRAVENOUS | PRN
Start: 2021-11-09 — End: 2021-11-09

## 2021-11-09 MED ORDER — oxyCODONE (ROXICODONE) immediate release tablet 2.5 mg
5 | ORAL | Status: AC | PRN
Start: 2021-11-09 — End: 2021-11-09

## 2021-11-09 MED ORDER — lactated Ringers infusion
INTRAVENOUS
Start: 2021-11-09 — End: 2021-11-11

## 2021-11-09 MED ORDER — proMETHazine (PHENERGAN) injection 6.25 mg
25 | Freq: Four times a day (QID) | INTRAMUSCULAR | PRN
Start: 2021-11-09 — End: 2021-11-09

## 2021-11-09 MED ORDER — propofol 10 mg/ml (DIPRIVAN) injection
10 | INTRAVENOUS | Status: AC | PRN
Start: 2021-11-09 — End: 2021-11-09
  Administered 2021-11-09: 17:00:00 50 via INTRAVENOUS

## 2021-11-09 MED ORDER — fentaNYL (SUBLIMAZE) 50 mcg/mL injection
50 | INTRAMUSCULAR | Status: AC
Start: 2021-11-09 — End: ?

## 2021-11-09 MED ORDER — celecoxib (CELEBREX) capsule 400 mg
200 | ORAL | Status: AC | PRN
Start: 2021-11-09 — End: 2021-11-09

## 2021-11-09 MED ORDER — fentaNYL (SUBLIMAZE) injection
50 | INTRAMUSCULAR | Status: AC | PRN
Start: 2021-11-09 — End: 2021-11-09
  Administered 2021-11-09: 17:00:00 15 via INTRATHECAL

## 2021-11-09 MED ORDER — enoxaparin (LOVENOX) for prophylaxis syringe 30 mg/0.3 mL
30 | Freq: Every evening | SUBCUTANEOUS
Start: 2021-11-09 — End: 2021-11-11
  Administered 2021-11-10 – 2021-11-11 (×2): 30 mg via SUBCUTANEOUS

## 2021-11-09 MED ORDER — fentaNYL (SUBLIMAZE) injection 12.5 mcg
50 | INTRAMUSCULAR | PRN
Start: 2021-11-09 — End: 2021-11-09

## 2021-11-09 MED ORDER — ceFAZolin (ANCEF) 2 g in sodium chloride 0.9% 100 mL ADDaptor IVPB
INTRAVENOUS | Status: AC | PRN
Start: 2021-11-09 — End: 2021-11-09
  Administered 2021-11-09: 17:00:00 2 g via INTRAVENOUS

## 2021-11-09 MED ORDER — HYDROmorphone (DILAUDID) injection Syrg 0.5 mg
0.5 | INTRAMUSCULAR | PRN
Start: 2021-11-09 — End: 2021-11-09

## 2021-11-09 MED ORDER — pregabalin (LYRICA) 75 MG capsule
75 | ORAL | Status: AC
Start: 2021-11-09 — End: 2021-11-09
  Administered 2021-11-09: 17:00:00 75 via ORAL

## 2021-11-09 MED ORDER — oxyCODONE (ROXICODONE) immediate release tablet 5 mg
5 | ORAL | PRN
Start: 2021-11-09 — End: 2021-11-09

## 2021-11-09 MED ORDER — celecoxib (CELEBREX) capsule 200 mg
200 | Freq: Every day | ORAL | Status: AC
Start: 2021-11-09 — End: 2021-11-11
  Administered 2021-11-10 – 2021-11-11 (×2): 200 mg via ORAL

## 2021-11-09 MED ORDER — naloxone (NARCAN) injection 0.04 mg
0.4 | INTRAMUSCULAR | PRN
Start: 2021-11-09 — End: 2021-11-09

## 2021-11-09 MED ORDER — ceFAZolin (ANCEF) 2 g in sodium chloride 0.9% 100 mL ADDaptor IVPB
Freq: Three times a day (TID) | INTRAVENOUS | Status: AC
Start: 2021-11-09 — End: 2021-11-10
  Administered 2021-11-10 (×2): 2 g via INTRAVENOUS

## 2021-11-09 MED ORDER — oxyCODONE (ROXICODONE) immediate release tablet 5 mg
5 | ORAL | PRN
Start: 2021-11-09 — End: 2021-11-11
  Administered 2021-11-11 (×4): 5 mg via ORAL

## 2021-11-09 MED ORDER — calcium carbonate (TUMS) chewable tablet 1,000 mg
200 | ORAL | PRN
Start: 2021-11-09 — End: 2021-11-11

## 2021-11-09 MED ORDER — ondansetron (ZOFRAN) injection 4 mg
4 | Freq: Three times a day (TID) | INTRAMUSCULAR | PRN
Start: 2021-11-09 — End: 2021-11-09

## 2021-11-09 MED ORDER — oxyCODONE ER (oxyCONTIN) 12 hr tablet 10 mg
10 | ORAL | PRN
Start: 2021-11-09 — End: 2021-11-09

## 2021-11-09 MED ORDER — fentaNYL (SUBLIMAZE) injection
50 | INTRAMUSCULAR | Status: AC | PRN
Start: 2021-11-09 — End: 2021-11-09
  Administered 2021-11-09 (×2): 25 via INTRAVENOUS

## 2021-11-09 MED ORDER — acetaminophen (TYLENOL) tablet 975 mg
325 | ORAL | Status: AC | PRN
Start: 2021-11-09 — End: 2021-11-09

## 2021-11-09 MED ORDER — fentaNYL (SUBLIMAZE) injection 25 mcg
50 | INTRAMUSCULAR | PRN
Start: 2021-11-09 — End: 2021-11-09

## 2021-11-09 MED ORDER — meperidine (PF) (DEMEROL) Soln 12.5 mg
25 | INTRAMUSCULAR | Status: AC | PRN
Start: 2021-11-09 — End: 2021-11-09

## 2021-11-09 MED ORDER — HYDROmorphone (DILAUDID) injection Syrg 0.2 mg
0.5 | INTRAMUSCULAR | PRN
Start: 2021-11-09 — End: 2021-11-09

## 2021-11-09 MED ORDER — lactated Ringers infusion
INTRAVENOUS | Status: AC | PRN
Start: 2021-11-09 — End: 2021-11-09
  Administered 2021-11-09: 17:00:00 via INTRAVENOUS

## 2021-11-09 MED ORDER — tranexamic acid in NaCl,iso-os IV piggyback 1,000 mg
1000 | Freq: Once | INTRAVENOUS | Status: AC
Start: 2021-11-09 — End: 2021-11-09
  Administered 2021-11-09 (×2): 1000 mg via INTRAVENOUS

## 2021-11-09 MED FILL — CEFAZOLIN 2 GRAM SOLUTION FOR INJECTION: 2 2 gram | INTRAMUSCULAR | Qty: 1

## 2021-11-09 MED FILL — PROPOFOL 10 MG/ML INTRAVENOUS EMULSION: 10 10 mg/mL | INTRAVENOUS | Qty: 20

## 2021-11-09 MED FILL — ENOXAPARIN 30 MG/0.3 ML SUBCUTANEOUS SYRINGE: 30 30 mg/0.3 mL | SUBCUTANEOUS | Qty: 0.3

## 2021-11-09 MED FILL — TYLENOL 325 MG TABLET: 325 325 mg | ORAL | Qty: 3

## 2021-11-09 MED FILL — TRANEXAMIC ACID 1,000 MG/100 ML(10 MG/ML)IN SOD CHLOR,ISO IV PIGGYBACK: 1000 1,000 mg/100 mL (10 mg/mL) | INTRAVENOUS | Qty: 100

## 2021-11-09 MED FILL — GABAPENTIN 100 MG CAPSULE: 100 100 MG | ORAL | Qty: 1

## 2021-11-09 MED FILL — CELEBREX 200 MG CAPSULE: 200 200 mg | ORAL | Qty: 2

## 2021-11-09 MED FILL — PREGABALIN 75 MG CAPSULE: 75 75 MG | ORAL | Qty: 1

## 2021-11-09 MED FILL — FENTANYL (PF) 50 MCG/ML INJECTION SOLUTION: 50 50 mcg/mL | INTRAMUSCULAR | Qty: 2

## 2021-11-09 NOTE — Unmapped (Signed)
Anesthesia Transfer of Care Note    Patient: [name]  Procedure(s) Performed: Procedure(s):  LEFT TOTAL KNEE ARTHROPLASTY    Patient location: PACU    Anesthesia type: spinal    Airway Device on Arrival to PACU/ICU: Room Air    IV Access: Peripheral    Monitors Recommended to be Used During PACU/ICU: Standard Monitors    Outstanding Issues to Address: None    Level of Consciousness: awake    Post vital signs:    Vitals:    11/09/21 1435   BP: 140/75   Pulse: 107   Resp: 22   Temp: 96.8 ??F (36 ??C)   SpO2: 99%       Complications:  No notable events documented.    Date 11/08/21 0700 - 11/09/21 0659(Not Admitted) 11/09/21 0700 - 11/10/21 0659   Shift 0700-1459 1500-2259 2300-0659 24 Hour Total 0700-1459 1500-2259 2300-0659 24 Hour Total   INTAKE   I.V.(mL/kg)     750(17.2)   750(17.2)     Volume (mL) (lactated Ringers infusion)     750   750   IV Piggyback     200   200     Volume (mL) (ceFAZolin (ANCEF) 2 g in sodium chloride 0.9% 100 mL ADDaptor IVPB)     100   100     Volume (mL) (tranexamic acid in NaCl,iso-os IV piggyback 1,000 mg)     100   100   Shift Total(mL/kg)     950(21.8)   950(21.8)   OUTPUT   Shift Total(mL/kg)           Weight (kg) 43.5 43.5 43.5 43.5 43.5 43.5 43.5 43.5

## 2021-11-09 NOTE — Unmapped (Signed)
INTRA-OP POST BRIEFING NOTE: [name]      Specimens:     Prior to leaving the room: Nurse confirmed name of procedure, completion of instrument, sponge & needle counts, reads specimen labels aloud including patient name and addresses any equipment issues? Nurse confirmed wound class. Nurse to surgeon and anesthesia: What are key concerns for recovery and management of the patient?  Yes      Blood products stored at appropriate temperatures prior to return to blood bank (if applicable)? N/A      Patient identification band secured on patient prior to transfer out of the operating room? Yes    Temporary devices implanted for the duration of the surgery removed and evaluated for intactness and completeness prior to closure? N/A      Other Comments:     Signed: Raahim Shartzer    Date: 11/09/2021    Time: 2:26 PM

## 2021-11-09 NOTE — Unmapped (Addendum)
ORTHOPAEDIC SERVICE DISCHARGE INSTRUCTIONS    ORTHOPAEDIC HOTLINE:  [phone]  ORTHOPAEDIC FAX:  [phone]    *For questions please call the Orthopaedic Hotline and leave a message.*  If your call is between the hours of 7:00 AM - 3:00 PM every day, an Orthopaedic Nurse will return your call.        For emergencies after 3:00 PM and on major holidays, please call the [facility] at [phone] and ask for the Orthopaedic Resident on call or return to an Emergency Department.    Call the Orthopaedic Hotline or Return to an Emergency Department for:  1) Fever greater than 101.5??                                      2) Persistent nausea & vomiting                               3) Shortness of breath                  4) Persistent or increasing unrelieved pain  5) Foul smelling drainage from wounds/surgical sites  6) Increased redness, swelling, or warmth at wound/surgical sites.      INJURY/CONDITION INFORMATION: Left Total Knee Replacement (6.6.23)     DIET:   [x]  Regular    []  Special diet:    ACTIVITY:      [x]  As tolerated    [x]  Shower with tan Aquacel dressing over incision. No tub bathing, swimming or soaking.   []  Sponge bath to keep dressing(s) dry once tan Aquacel dressing is removed. Continue to sponge bath until otherwise ordered by your doctor.       RESTRICTIONS:   Right leg:    [x]  Weight bear as tolerated     []  Knee immobilizer    WOUND CARE:   [x]  Leave sutures/staples/steri-strips in place                                    [x]  Dressing changes: Remove the tan Aquacel dressing on 6.13.23 and place a new Aquacel dressing over incision. Leave new dressing in place until your follow up appointment .     PATIENT/FAMILY TEACHING:  [x]  Return to work/school on: to be determined at follow-up                   [x]  Return to driving: to be determined at follow-up             [x]  Pain management - ice and elevation    [x]  Incentive spirometer and coughing 10 times each hour while awake  []   Vaccinations given:    []  Tetanus:  []  Influenza (Flu):  []  Pneumovax (Pneumonia):  []  Diabetes information - see attached    MEDICATIONS:   [x]  Anticoagulation:   [x]  Prevention   [x]  Medication:    [x]  Aspirin 81 mg twice daily for 28 days after surgery.   [x]  OK to take Ibuprofen/NSAID (Advil, Motrin, Aleve, Naprosyn, Mobic, Celebrex) products when taking Acetaminophen (Tylenol).     -Do not exceed 1200 mg total or 400 mg 3 times/day Ibuprofen in 24 hours unless prescribed more by a physician. 2 tablets of over the counter 200 mg strength Ibuprofen equals 400 mg.     -  Always take with a full glass of water and with food.  [x]  Please take prescribed Pepcid while taking 28 day course of Aspirin and Celebrex/Mobic or other NSAIDS.  []  Do not take Ibuprofen or other NSAIDs while taking 28 day course of Aspirin unless okay'd by Orthopaedics physician  [x]  OK to take Acetaminophen (Tylenol) when taking plain Oxycodone (Roxicodone).    []  Do not take additional Acetaminophen (Tylenol) products while taking combination medications Oxycodone/APAP (Percocets) or Hydrocodone/APAP (Lortab, Vicodin, Norco).    *Do not exceed 3000 mg (9 tablets of 325 mg strength or 6 tablets of 500 mg strength) Acetaminophen (Tylenol) in 24 hours.                             *Take pain medication as prescribed. Do not drink alcohol, drive or operate heavy machinery while on narcotics.    DISCHARGE:  []  Home  [x]  Home with 24 hour/day assistance  []  Other:    EQUIPMENT   []  Crutches    [x]  Shower chair (patient to obtain, prescription provided)   []  Abduction pillow/brace    [x]  Rolling Walker (delivered to bedside prior to discharge)   [] CPM: Start at 0-40?? of flexion, increase by 5-10?? per day. Wear CPM no longer than 4 hours at a time.  []  Other:    HOME CARE AGENCY: [facility] [phone]   Services ordered:    [x]  PT - Physical Therapy   [x]  OT - Occupational Therapy   []  ST - Speech Therapy   []  Skilled Nursing   Please  call the Orthopaedic Hotline [phone] if you have not heard from your Home Care Agency in 2 days.     HOME HEALTH CARE INSTRUCTIONS:  Patient is set up to receive home healthcare with [facility] [phone]. Your home health care agency should contact you within 24 hours after discharge. If you do not hear from them, immediately contact your home care agency to set up your first home healthcare visit. If you have any questions or concerns pertaining to home healthcare, please do not hesitate to contact your home care agency, as they are prepared to address any home healthcare issues or concerns that may arise.

## 2021-11-09 NOTE — Unmapped (Signed)
Total Knee Arthroplasty Operative Report    Pre-operative Diagnosis:   1. Left knee post traumatic osteoarthritis    Post-operative Diagnosis:   1. Left knee post traumatic osteoarthritis    Procedure(s) Performed:  1. Left total knee arthroplasty    Implants:   Zimmer Persona Posterior Stabilizing Knee  Femur component: cemented size 7  Tibial component size: D  Patella: size 32 mm cemented patellar button  Polyethylene insert: 12 mm    Surgeon:  Skipper Cliche, M.D.     Assistants: 1.  Tawana Scale MD            2.  Cyndi Lennert MD            Anesthesia: General    Estimated Blood Loss:  Minimal    Tourniquet Time: 55 minutes at 250 mmHg on the left thigh             Drains: None    Complications:  None; patient tolerated the procedure well.           Condition: stable    Indications: [name] is a patient well known to the office that has had persistent left knee pain.  The patient has failed conservative management thus surgical intervention was considered.  The patient was informed of all the risks, benefits, and alternatives to surgical intervention and elected to proceed with left total knee arthroplasty.  Informed consent was signed and the patient presented to the operative suite for the above mentioned procedure.  The patient was informed of the risks and benefits including but not limited to hardware failure, need of revision surgery, avascular necrosis, reflex sympathetic dystrophy, arthrofibrosis, prolonged rehabilitation, cosmetic deformity, limp, continuing pain, worsening pain, blood loss, nerve injury, infection, and the remote complication of death under anesthesia.  Again, the patient accepted these risks and informed consent was signed.  The patient presented to the operative suite for the above mentioned procedure.    Procedure Details:  The patient was properly identified in the preoperative holding area.  The left lower extremity was marked per the surgical team. The patient was moved  from the stretcher to the operating room table and placed in the supine position.   The patient underwent uneventful induction of general anesthesia with an endotracheal tube.  The non-operative extremity was secured to the bed.  All bony prominences were well padded. A nonsterile tourniquet was placed on the upper thigh of the operative leg.  The lower extremity was then prepped and draped in the usual sterile fashion.  A timeout was performed. Preoperative antibiotic and TXA were given.  Markings for a standard anterior approach to the knee were marked out, and the leg was exsanguinated using an Esmarch bandage and a thigh-high tourniquet inflated to 300 mmHg. The foot was then positioned in the Alvarado boot in order to use the leg holder.   The skin incision was made, taking it through the skin and the subcutaneous tissues down to the level of the quadriceps tendon.  A standard medial parapatellar arthrotomy approach was used to gain access to the knee joint. The fat pad was removed from the undersurface of the patella tendon as well as from the suprapatellar space.  A medial release of the deep MCL was performed using a bovie as well as an osteotome.    The anterior aspects of the medial and lateral menisci were sharply resected using a #10 blade. The remaining ACL was also excised.  The PCL was  also released. All osteophytes were removed with a rongeur.    Patella initial preparation  A towel clip was used to hold the patella in place. A caliper was used to measure the patella depth and then 9-10 mm of bone were removed from the patella using a saw.  The caliper was used to verify the correct thickness.  A metal patella protector was malleted in place in order to retract the patella for the preparation of the femur and tibia.     Tibia Resection  The leg was then flexed back to 90 degrees and a 90 degree retractor was used to retract the patella laterally and homan retractor was used to protect the MCL. A PCL  retractor was placed to translate the tibia forward.  The extramedullary guide was placed and secured initially with a power pin. The slope and amount of cut were set and the guide was secured with another power pin. The 10/2 guide and angel wing were used to verify the appropriate cut. The tibia cut was then made using a saw. The bone fragment was then removed using a kocher and the bovie.    Femur Preparation  Attention was then turned to preparation of the femur. The bovie was used to mark whiteside's line and a line perpendicular to this. The intramedullary drill was used to find the intramedullary canal.  The intramedullary guide was placed and an initial distal femur cut was made.  The femur was sized and the appropriate cutting guide was place with 3 degrees of ER.  Anterior cut was check to make sure that there is no notching.  Anterior, posterior and chamfer cuts were made.      Gap balancing  A lamina spreader was then placed in the medial and  lateral joint line. All remaining meniscus was removed. Posterior osteophytes were removed as well if present.  Spacer blocks were then placed to assess the flexion and extension gaps as well as the mechanical axis.    Tibial sizing and components trialing  Tibia trial was place to ensure full coverage.  This was secured with pins followed by canal drilling and broaching of keel.  The femoral trial was placed followed by 10 mm articular surface trial.  The knee taken through full range of motion, and found to be quite stable.     Patella finishing  At this point, attention was then turned to the patella and the patella was sized. An appropriate sized drill guide was used and the   three pilot holes for the patella were drilled. A patella trial was placed and the knee was ranged and the patella was noted to be tracking without tilting     Box Cut for PS Knee  The box guide was then used to make the box cuts for the posterior stabilized knee if PCL was  removed.    Final Implants  All trial components were removed followed by copious irrigation of bone and soft tissue.  Mixture of Toradol, Ropivacaine, epinephrine and Clonodine was injected for pain control.  The actual components were then cemented into place beginning with the tibial component, the femoral component and placement of articular surface.  All excess cement was removed using Freers.  The knee was taken into full extension, and again, all excess cement was removed using Freers.  The patella component was then cemented with a clamp.  Once cement has set, the knee was once again taken into full range of motion.  There was  noted to be a minimal degree of anterior translation and lateral ligaments tight at all points of motion of the knee. The knee was then again copiously irrigated.  Second unit of TXA was given intravenously.    Closure  The knee was then closed in layers after the cement had completely hardened with 0 Ethibond figure-of-eight sutures for the capsule layer of the knee, and inverted 2-0 Vicryl sutures for the deep dermal layer closure.  Staples were applied to re-approximate the skin  Sterile dressings were applied consisting of Aquacel dressing and an ACE wrap.    The patient was awakened per anesthesia protocol without any difficulty, extubated, and taken to the post anesthesia care unit in stable condition, having tolerated the procedure well without any complication.  This was a clean elective case.      I was the attending surgeon for this case and was present and scrubbed for entire case including soft tissue releases, bone cuts and implant placement.      Post operative Plan:  The patient will be admitted and will undergo PT/OT beginning on POD #0.  PT/OT will work with for discharge. The patient will be weight bear as tolerated and use knee immobilizer as needed. A continuous passive motion machine will be used postoperatively during hospital stay.        Eldred Manges,  M.D.

## 2021-11-09 NOTE — Unmapped (Signed)
Anesthesia Post Note    Patient: [name]    Procedure(s) Performed: Procedure(s):  LEFT TOTAL KNEE ARTHROPLASTY    Anesthesia type: spinal    Patient location: PACU    Airway: Patent    Post pain: Adequate analgesia    Nausea / Vomiting: Absent    Post-operative Hydration Status: Adequate    Post assessment: no apparent anesthetic complications and tolerated procedure well    Last Vitals:   Vitals:    11/09/21 1600 11/09/21 1615 11/09/21 1630 11/09/21 1645   BP: 131/73 132/80 134/79 128/77   BP Location:       Patient Position:       Pulse: 71 77 70 69   Resp: 10 17 12 10    Temp:       TempSrc:       SpO2: 99% 100% 100% 99%   Weight:       Height:            Post vital signs: stable    Level of consciousness: awake and alert     Complications:  There were no known notable events for this encounter.

## 2021-11-09 NOTE — Unmapped (Signed)
Spinal            Patient location during procedure: OR  Performed intraoperatively.                 Patient position: sitting    Preanesthetic Checklist  Completed: patient identified, pre-op evaluation, timeout performed, IV checked, risks and benefits discussed, monitors and equipment checked/attached to patient, patient being monitored and hand hygiene performed    Questions answered / anesthesia plan accepted and patient agrees to proceed..   By:  Ramon Dredge md  Timeout verification:  correct patient, correct procedure, correct site and allergies reviewed.    Spinal    Prep: ChloraPrep and site prepped and draped                Approach: midline      Needle     Needle type: Pencan   Introducer used.    Needle gauge: 25 G    Needle length: 3.5 in  Level of placement:  L3-4    Free flow of CSF noted.  Number of attempts:  1                 Medications:    Injection time:  11/09/2021 12:51 PM                  Other medications:  fentanyl 15 mcg.      Assessment  Events:  blood not aspirated via needle, injection not painful, no injection resistance, no paresthesia with needle placement, no other event and no positive intravascular test dose                                                         Staffing  Performed: CRNA   Anesthesiologist: Perry Mount, MD  Resident/CRNA: Bridgette Habermann, CRNA      Medications Administered  bupivacaine (MARCAINE) injection 0.75% - Intrathecal   1.4 mL - 11/09/2021 12:51:00 PM  EPINEPHrine (PF) injection 1 mg/mL - Intrathecal   100 mcg - 11/09/2021 12:51:00 PMMedication administered at:  11/09/2021 12:51 PM

## 2021-11-09 NOTE — Unmapped (Signed)
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]       Post-op Diagnosis: same    Procedure(s):  LEFT TOTAL KNEE ARTHROPLASTY      Surgeon(s):  Skipper Clicheheodore Toan Lynwood Kubisiak, MD    Anesthesia: MAC (Monitor Anesthesia Care)    Staff:   Circulator: Donnie MesaGrace Adkins, RN; Julaine FusiMary Roebel, RN  Relief Circulator: Boykin NearingMichael Canary, RN  Relief Scrub: Mitzi HansenStephanie Hughes, RN  Scrub Person: August Williams  Resident: Vita ErmAlthea Anne Perez, MD; Wynell BalloonJonathan D Ellis, MD    Estimated Blood Loss: Minimal                 Specimens:            Drains:               There were no complications unless listed below.         Eldred Mangesheodore T Lyal Husted     Date: 11/09/2021  Time: 4:08 PM

## 2021-11-09 NOTE — Unmapped (Signed)
Regional Block       Block Type- Lower Extremity: adductor canal      Laterality:  left    Medications Administered: Pre Sedation / Block Medications  bupivacaine (PF)(SENSORCAINE/MARCAINE) 0.5% injection - PERINEURAL   10 mL - 11/09/2021 12:25:00 PM  Medication administered at:  11/09/2021 12:25 PM         Preanesthetic Checklist  Completed:  patient identified, IV checked, risks and benefits discussed, pre-op evaluation, timeout performed, anesthesia consent, hand hygiene performed, patient being monitored, patient agrees, verbalizes understanding, and wants to proceed and questions answered / anesthesia plan accepted      Block Reason: post-op pain management     Diagnosis:  postoperative pain management   Patient Location during procedure:  pre-op holding (SDS)   Block Timing:  preoperatively  Timeout verification:  correct patient, correct procedure, correct site, allergies reviewed and anticoagulant precautions reviewed    Timeout performed at:  12:25 EDT  Timeout performed by:  Joanna Hews    PROCEDURE:  Block Start Time:  11/09/2021 12:25 PM    Patient Position:  sitting  Prep: Chloraprep    Needle: Block needle 21 G 4 in         Injection Technique:  single shot             Continuous ultrasound guidance was utilized for the procedure. Tissue planes, vascular and nerve structures were identified. Relevant anatomy for the intention of the block was noted. Ultrasound image was interpreted, captured, and stored.          Complications:  blood not aspirated, no injection resistance, no paresthesia and no other event       See EMR for periprocedural vitals signs. Stable thoroughout unless otherwise documented      Block End Time:  11/09/2021 12:30 PM    Notes:   Written by Joanna Hews RN, acting as scribe for Dr. Ronnette Juniper.        Staffing:  Anesthesiologist:  Mary Sella, MD  Resident/Fellow: Alba Destine, MD    Other Anesthesia Staff:  Bayard Males, MD  Performed by:  Resident/Fellow

## 2021-11-09 NOTE — Unmapped (Addendum)
[place]  DEPARTMENT OF ANESTHESIOLOGY  PRE-PROCEDURAL EVALUATION    [name] is a 76 y.o. 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       No history of anesthetic complications   I have reviewed the History and Physical Exam, any relevant changes are noted in the anesthesia pre-operative evaluation.      Cardiovascular:    Exercise tolerance: good    (-) hypertension, past MI, CAD, dysrhythmias, angina.    Neuro/Muscoloskeletal/Psych:    (+) neuromuscular disease, arthritis and back problem.    (-) seizures, TIA, CVA.     Pulmonary:      (-) COPD, asthma, shortness of breath, recent URI.       GI/Hepatic/Renal:    Chronic renal disease (solo kidney, congenitally).  (-) GERD.    Comments: Hyponatremia to 130s, not symptomatic    Endo/Other:        (-) diabetes mellitus, hypothyroidism, hyperthyroidism, no anemia, no thrombocytopenia.       Past Medical History     Past Medical History:   Diagnosis Date   ??? Acquired absence of ovary, unilateral     Missing left ovary.   ??? Carpal tunnel syndrome on left 03/18/2016    Added automatically from request for surgery [id number]   ??? Closed fracture of upper end of tibia 12/18/2009   ??? 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   ??? History of fall     fell 10/2015   ??? Low back pain    ??? Osteoarthritis    ??? Osteoporosis    ??? Urticaria    ??? Varicella        Past Surgical History     Past Surgical History:   Procedure Laterality Date   ??? CESAREAN SECTION  1985   ??? HARDWARE REMOVAL  8/09    of left tibia plateau   ??? LEG SURGERY  05/09/06    fractured left tibia plateau   ??? RELEASE CARPAL TUNNEL Left 03/22/2016    Procedure: LEFT CARPAL TUNNEL RELEASE;  Surgeon: Kathrene Bongo, MD;  Location: [facility];  Service: Orthopedics;  Laterality: Left;   ???  WRIST FRACTURE SURGERY Left 11/13/2015    Procedure: OPEN REDUCTION INTERNAL FIXATION LEFT DISTAL RADIUS FRACTURE;  Surgeon: Kathrene Bongo, MD;  Location: [facility];  Service: Orthopedics;  Laterality: Left;       Family History     Family History   Problem Relation Age of Onset   ??? Stroke Mother    ??? Melanoma Neg Hx        Social History     Social History     Socioeconomic History   ??? Marital status: Widowed     Spouse name: Not on file   ??? Number of children: Not on file   ??? Years of education: Not on file   ??? Highest education level: Not on file   Occupational History   ??? Not on file   Tobacco Use   ??? Smoking status: Never   ??? Smokeless tobacco: Never   Vaping Use   ??? Vaping Use: Never used   Substance and Sexual Activity   ???  Alcohol use: Yes     Alcohol/week: 7.0 standard drinks     Types: 7 Glasses of wine per week   ??? Drug use: No     Comment: 12-23-2009   ??? Sexual activity: Yes     Partners: Male   Other Topics Concern   ??? Caffeine Use Not Asked   ??? Occupational Exposure Not Asked   ??? Exercise Not Asked   ??? Seat Belt Not Asked   Social History Narrative   ??? Not on file     Social Determinants of Health     Financial Resource Strain: Not on file   Physical Activity: Not on file   Stress: Not on file   Social Connections: Not on file   Housing Stability: Not on file       Medications     Allergies:  Allergies   Allergen Reactions   ??? Iodine Anaphylaxis, Hives and Shortness Of Breath     IVP dye for a kidney function test greater than 20 years ago   ??? Iodine And Iodide Containing Products Anaphylaxis     Respiratory Distress  IVP dye for a kidney function test greater than 20 years ago  Respiratory Distress   ??? Iodinated Contrast Media      Shortness of breath, hives.   ??? Shellfish Containing Products      Advised to avoid shellfish due to allergy to iodine.       Home Meds:  Prior to Admission medications as of 10/26/21 1323   Medication Sig Taking?   alendronate (FOSAMAX) 35 MG tablet Take 1 tablet (35 mg total)  by mouth every 7 days. Yes   ibuprofen (ADVIL,MOTRIN) 200 MG tablet Take 1 tablet (200 mg total) by mouth daily as needed for Pain. Takes few times a week due to neck pain and occas bedtime Yes       Inpatient Meds:  Scheduled:   Continuous:     PRN:     Vital Signs     Wt Readings from Last 3 Encounters:   10/21/21 (!) 96 lb (43.5 kg)   10/26/21 (!) 94 lb 14.4 oz (43 kg)   09/30/21 (!) 96 lb 6.4 oz (43.7 kg)     Ht Readings from Last 3 Encounters:   10/21/21 5' 4 (1.626 m)   09/30/21 5' 4 (1.626 m)   09/27/21 5' 4 (1.626 m)     Temp Readings from Last 3 Encounters:   09/27/21 97.2 ??F (36.2 ??C)   06/25/19 98.1 ??F (36.7 ??C)   03/22/16 98.2 ??F (36.8 ??C) (Oral)     BP Readings from Last 3 Encounters:   10/26/21 98/60   09/27/21 110/62   06/25/19 100/68     Pulse Readings from Last 3 Encounters:   10/26/21 78   09/27/21 73   06/25/19 76     @LASTSAO2 (3)@    Physical Exam     Airway:     Mallampati: II    Dental:   - No obvious cracked, loose, chipped, or missing teeth.     Pulmonary:   - normal exam     Cardiovascular:  - normal exam     Neuro/Musculoskeletal/Psych:    Mental status: alert and oriented to person, place and time.          Abdominal:    - normal exam    Current OB Status:       Other Findings:        Laboratory Data  Lab Results   Component Value Date    WBC 5.8 10/26/2021    HGB 13.5 10/26/2021    HCT 38.7 10/26/2021    MCV 97.1 10/26/2021    PLT 198 10/26/2021       No results found for: [facility]    Lab Results   Component Value Date    GLUCOSE 100 10/26/2021    BUN 11 10/26/2021    CO2 27 10/26/2021    CREATININE 0.75 10/26/2021    K 4.2 10/26/2021    NA 130 (L) 10/26/2021    CL 96 (L) 10/26/2021    CALCIUM 9.3 10/26/2021    ALBUMIN 4.1 10/26/2021    PROT 6.3 (L) 10/26/2021    ALKPHOS 89 09/27/2021    ALT 10 09/27/2021    AST 24 12/30/2009    AST 24 12/30/2009    BILITOT 0.6 09/27/2021       Lab Results   Component Value Date    INR 1.1 10/26/2021       No results found for: PREGTESTUR, PREGSERUM,  HCG, HCGQUANT    Anesthesia Plan     ASA 2         Female and current non-smoker    Anesthesia Type:  spinal.      PONV Risk Factors: female, current non-smoker,  plan for postoperative opioid use.              Induction:   Intravenous induction.      Anesthetic plan and risks discussed with patient.    Plan, alternatives, and risks of anesthesia, including death, have been explained to and discussed with the patient/legal guardian.  By my assessment, the patient/legal guardian understands and agrees.  Scenario presented in detail.  Questions answered.    Use of blood products discussed with patient who consented to blood products.

## 2021-11-09 NOTE — Unmapped (Signed)
UPDATED BRIEF H&P    Patient seen in pre-operative area   No changes from prior H&P  Patient denies any changes in medical status  Site marked and consent obtained/verified  Plans discussed and questions answered  To OR when ready    See previous EPIC notes for full details    Vita ErmALTHEA ANNE Johnetta Sloniker, MD  Orthopedic Surgery Resident  11/09/2021 11:48 AM

## 2021-11-09 NOTE — Unmapped (Signed)
[facility]  Case Management/Social Work Department  Discharge Planning Screen    Screening Questions     Do you need help filling out medical forms: No  Is patient from anywhere other than a private residence? (shelter, SNF, LTC, IPR, LTAC, etc.): No  Do you have any services that come into the house to help you? (COA, private duty, HHC, etc): No  Do you have barriers getting to follow up appointment or obtaining prescriptions?: No  Have you been to the (ED/hospital) 4x times in the past 6 months? : No    No discharge needs identified per chart screen     Update per Ortho Team that pt is not medically stable for discharge at this time.  Per report plan for OR today for Left Total Knee Arthroplasty.  Pt is expected to discharge home with no needs post-op.  Social Work to continue to follow.       [name], MSW, LSW  [phone]

## 2021-11-10 LAB — HEMOGLOBIN AND HEMATOCRIT, BLOOD
Hematocrit: 31.1 % — ABNORMAL LOW (ref 35.0–45.0)
Hemoglobin: 11 g/dL — ABNORMAL LOW (ref 11.7–15.5)
MCH: 34.3 pg — ABNORMAL HIGH (ref 27.0–33.0)
MCHC: 35.2 g/dL (ref 32.0–36.0)
MCV: 97.3 fL (ref 80.0–100.0)
RDW: 11.9 % (ref 11.0–15.0)

## 2021-11-10 LAB — BASIC METABOLIC PANEL
Anion Gap: 6 mmol/L (ref 3–16)
BUN: 12 mg/dL (ref 7–25)
CO2: 25 mmol/L (ref 21–33)
Calcium: 8.3 mg/dL — ABNORMAL LOW (ref 8.6–10.3)
Chloride: 99 mmol/L (ref 98–110)
Creatinine: 0.66 mg/dL (ref 0.60–1.30)
EGFR: >90
Glucose: 110 mg/dL — ABNORMAL HIGH (ref 70–100)
Osmolality, Calculated: 270 mosm/kg — ABNORMAL LOW (ref 278–305)
Potassium: 4.3 mmol/L (ref 3.5–5.3)
Sodium: 130 mmol/L — ABNORMAL LOW (ref 133–146)

## 2021-11-10 MED ORDER — lactated Ringers 1,000 mL bolus
Freq: Once | INTRAVENOUS | Status: AC
Start: 2021-11-10 — End: 2021-11-10
  Administered 2021-11-10: 16:00:00 via INTRAVENOUS

## 2021-11-10 MED ORDER — aspirin 81 MG chewable tablet
81 | ORAL_TABLET | Freq: Two times a day (BID) | ORAL | 0 refills | 30.00000 days | Status: AC
Start: 2021-11-10 — End: 2021-12-09
  Filled 2021-11-10: qty 56, 28d supply, fill #0

## 2021-11-10 MED FILL — OXYCODONE 5 MG TABLET: 5 5 MG | ORAL | Qty: 1

## 2021-11-10 MED FILL — TYLENOL 325 MG TABLET: 325 325 mg | ORAL | Qty: 3

## 2021-11-10 MED FILL — ONDANSETRON HCL (PF) 4 MG/2 ML INJECTION SOLUTION: 4 4 mg/2 mL | INTRAMUSCULAR | Qty: 2

## 2021-11-10 MED FILL — GABAPENTIN 100 MG CAPSULE: 100 100 MG | ORAL | Qty: 1

## 2021-11-10 MED FILL — CELEBREX 200 MG CAPSULE: 200 200 mg | ORAL | Qty: 1

## 2021-11-10 NOTE — Unmapped (Signed)
ORTHOPAEDIC SURGERY PROGRESS NOTE    ADMIT DATE:  11/09/2021    S:  Using bedpan while in the room but reports that her pain is ok on her pain medication. Otherwise we discussed plans to work with PT today.     O:  Vitals:    11/09/21 1804 11/09/21 1936 11/09/21 2322 11/10/21 0354   BP: 127/80 121/70 104/66 104/73   BP Location: Right arm Left arm Right arm Left arm   Patient Position: Lying Lying Lying Lying   Pulse: 67 79 69 74   Resp: 14 16 16 16    Temp: 98.1 ??F (36.7 ??C) 97.9 ??F (36.6 ??C) 97.7 ??F (36.5 ??C) 97.5 ??F (36.4 ??C)   TempSrc: Oral Oral Oral Axillary   SpO2: 100% 99% 98% 99%   Weight:       Height:           General:  NAD  CV/Pulmonary:  breathing unlabored    MSK:  L LE  Dressing c/d/i   SILT sp/dp/t/sural/saphenous  EHL/TA/GS intact  Toes up and down  Cap refill < 2 sec      A/P:  [name] is a 76 y.o. female who is s/p  LEFT TOTAL KNEE ARTHROPLASTY       -IV Abx:  ancef post-op x 2 doses  -WBS: wbat LLE   -PT/OT: eval and treat  -Diet: regular   -OR plans: complete   -Anticoagulation: lovenox  -Dispo planning: pending PT/OT recs      Vita ErmALTHEA ANNE Damarys Speir MD  ORTHOPAEDIC SURGERY RESIDENT

## 2021-11-10 NOTE — Unmapped (Addendum)
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  Pt would benefit from continued therapy at home upon d/c from [facility].    Assessment: Impaired Bed Mobility;Impaired Transfers;Impaired Gait;Impaired Balance;Impaired Strength;Impaired ROM;Impaired Psychologist, counselling;Impaired Activity Tolerance  Prognosis: Good    Recommendation  Recommendation: Home with 24 hour supervision/assistance, Home PT (pending increased gait and stair assessment)  Equipment Recommended: Rolling walker    Justification for DME ordered: [name]: Patient has decreased weight bearing or impaired balance putting them at risk for falling without use of a walker. They are unable to utilize crutches or a cane to provide adequate support    Outcome Measures  AM-PAC 6 Clicks Basic Mobility Inpatient Short Form: PT 6 Clicks Score: 20     Mobility Recommendations for Staff  Patient ability: Patient ambulates in room/ to bathroom  Assist needed: with 1 person assist  Equipment/ Precautions needed: Requires assistive device, Has precautions  Requires Assistive Device: Rolling walker (gait belt)  Precautions: WBAT L LE    Cognition  Overall Cognitive Status: Within Functional Limits  Cognitive Assessment: Arousal/ Alertness;Orientation Level;Following Commands;Behavior;Safety Judgment;Insight  Arousal/Alertness: Alert  Orientation Level: Oriented X4  Behavior:  Appropriate;Cooperative  Following Commands: Follows all commands and directions without difficulty  Safety Judgment: Good awareness of safety precautions     Pain  Pain Score: 4  (increased to 6/10 at end of treatment session)  Pain Location: Knee (left)  Pain Descriptors: Aching  Pain Intervention(s): Repositioned;Ambulation/increased activity  Therapist reported pain to: RN     Mobility  Bed Mobility  Supine to Sit: Supervision;head of bed elevated;towards the left    Transfers  Sit to Stand: Contact Guard assistance;up to assistive device (x 1 rep from EOB, x 1 rep from toilet)  Sit to Stand Assistive Device: Rolling walker    Gait  Distance: 150'  Level of Assistance: Film/video editor Device: Rolling walker  Gait Characteristics: Steady;swing-through pattern;decreased cadence;L Decreased stance time    Stairs  Number of Stairs: 10  Stair Management Assistance: Contact Guard assist  Stair Management Technique: One rail L;Step to pattern;Forwards;With walker    Balance  Sitting - Static: Supervision  Sitting - Dynamic: Supervision  Standing - Static: Stand-by assistance;With Assistive Device   Standing-Static Assistive Device: Rolling walker  Standing - DynamicContractor;With Assistive Device  Standing-Dynamic Assistive Device: Rolling walker    Gait belt used: Yes    Exercise  Therapeutic Exercises  Side: left  Extremity: lower extremity  Type of Exercise: AROM  Seated  Exercises: Long arc quads;Knee flexion  Reps/Comments: x 10 reps  L knee AAROM 5-90*    Position after Treatment and Safety Handoff  Position after treatment and safety handoff  Position after therapy session: Recliner  Details: RN notified;Call light/ needs within reach;visitor present;cold applied  Alarms: Chair  Alarms Status: Activated and Interfaced with call system  Goals  Goals Met: Stairs    Collaborated with: Patient  Patient Stated Goal: to go home  Goals to be met by: 11/17/21  Patient will  transition from supine to sit: Independent, head of bed flat  Patient will transition from sit to supine: Independent, head of bed flat  Patient will transfer from sit to stand: Supervision, up to assistive device (to RW)  Patient will ambulate: Stand-By assistance, with assistive device, distance (in feet)   Ambulation Asssistance Device: Rolling walker  Distance (in feet): 150'  Patient will go up / down stairs: Supervision, Number of stairs, With assistive device, with handrail  Stairs Assistive Device: Rolling walker (folded)  Number of Stairs: 10  Hand Rail: Left handrail  Miscellaneous Goal #1: Independent with L LE HEP  Miscellaneous Goal #2: L knee AAROM 0-90*  Long-term goal to be met by: 11/24/21  Long Term Goal : Pt will ambulate 300' with modified independence using LRAD    Patient/Family Education  Educated patient on the role of physical therapy, goals, plan of care, importance of increased activity, discharge recommendations, home exercise program, transfer training, gait training, stair training, weight bearing precautions and polar pack and fall prevention strategies, including need for supervision/ assistance with OOB activity; patient verbalized understanding and demonstrated understanding. Handout(s) issued: none.    Plan  Plan  Treatment/Interventions: LE strengthening/ROM, Endurance training, Patient/family training, Equipment eval/education, Gait training, Therapeutic Exercise, Therapeutic Activity, Stair Training  PT Frequency: minimum 5x/week    The plan of care and recommendations assesses the patient's and/or caregiver's readiness, willingness, and ability to provide or support functional mobility and ADL tasks as needed upon discharge.      Time  Start Time: 1304  Stop Time: 1346  Time Calculation (min): 42 min    Charges    $Gait/Mobility: 8-22 mins  $Therapeutic Exercise: 8-22 mins  $Therapeutic Activity: 1 unit                   Problem List  Patient Active Problem List   Diagnosis   ???  Thoracic or lumbosacral neuritis or radiculitis, unspecified   ??? Degeneration of lumbar or lumbosacral intervertebral disc   ??? Osteoporosis   ??? Routine health maintenance   ??? Lumbar radiculopathy   ??? Post-traumatic osteoarthritis of left knee        Past Medical History  Past Medical History:   Diagnosis Date   ??? Acquired absence of ovary, unilateral     Missing left ovary.   ??? Carpal tunnel syndrome on left 03/18/2016    Added automatically from request for surgery [id number]   ??? Closed fracture of upper end of tibia 12/18/2009   ??? 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   ??? History of fall     fell 10/2015   ??? Low back pain    ??? Osteoarthritis    ??? Osteoporosis    ??? Urticaria    ??? Varicella         Past Surgical History  Past Surgical History:   Procedure Laterality Date   ??? CESAREAN SECTION  1985   ??? HARDWARE REMOVAL  8/09    of left tibia plateau   ??? LEG SURGERY  05/09/06    fractured left tibia plateau   ??? RELEASE CARPAL TUNNEL Left 03/22/2016    Procedure: LEFT CARPAL TUNNEL RELEASE;  Surgeon: Kathrene Bongo, MD;  Location: [facility];  Service: Orthopedics;  Laterality: Left;   ???  TOTAL KNEE ARTHROPLASTY Left 11/09/2021    Procedure: LEFT TOTAL KNEE ARTHROPLASTY;  Surgeon: Skipper Cliche, MD;  Location: [facility];  Service: Orthopedics;  Laterality: Left;   ??? WRIST FRACTURE SURGERY Left 11/13/2015    Procedure: OPEN REDUCTION INTERNAL FIXATION LEFT DISTAL RADIUS FRACTURE;  Surgeon: Kathrene Bongo, MD;  Location: [facility];  Service: Orthopedics;  Laterality: Left;

## 2021-11-10 NOTE — Unmapped (Addendum)
[facility] was advised patient is need of home health and DME. Sent in referrals awaiting on acceptance.       UPDATE: 1139 Patient has been accepted with [facility] [phone] and [facility] [phone]. Andree Moro will deliver RW to bedside today, shower chair was not cover under insurance. Informed SW A. Virginia Rochester of this matter.     [facility] continue to follow...    Fonnie Mu  Care Coordinator Assistant  Care Management Services  [phone]

## 2021-11-10 NOTE — Unmapped (Signed)
Occupational Therapy  Initial Assessment     Name: [name]  DOB: July 02, 1945  Attending Physician: Skipper Cliche, MD  Admission Diagnosis: Post-traumatic osteoarthritis of left knee [M17.32]  Date: 11/10/2021  Room: 6350/U6350  Reviewed Pertinent hospital course: Yes    Hospital Course PT/OT: 76 yo female presents s/p OR(6/6): L TKA, adductor canal block  Relevant PMH : L carpal tunnel syndrome, OA  Precautions: WBAT L LE  Activity Level: Ambulate with assistance    Assist: Co-evaluation performed    Recommendation  Recommendation: Home with 24 hour supervision/assistance  Equipment Recommendations: Shower chair    Assessment  Assessment: Decreased activity tolerance, Decreased Functional Mobility, Decreased Balance, Decreased ADL status, Decreased Safe judgment during ADL, Decreased IADLs, Decreased self-care transfers                Prognosis for OT goals: Good                          Outcome Measures  AM-PAC 6 Clicks Daily Activity Inpatient Short Form: OT 6 Clicks Score: 21    Home Living/Prior Function  Patient able to provide accurate information at this time: Yes  Lives With: Alone  Assistance available: 24 hour assistance  Type of Home: House  Home Entry: More than 1 step to enter, with railing  Stairs to enter: 6  Home Layout: Bed/bath upstairs, 1/2 bath on main level, Two level (Pt moved bed to first floor)  Stairs within: 1 flight  Bathroom Shower/Tub: Pension scheme manager: Midwife: none  Home Equipment: Single-point cane    Prior Function  Functional Mobility: Independent ( no assistive device)  ADL Assistance: Independent  IADL Assistance: Independent  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[employer])  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, reading, taking [place] course    Pain  Pain Score: 4   Pain Location: Leg (left)  Pain Descriptors: Sore  Pain Intervention(s): Repositioned;Ambulation/increased activity    Cognition  Overall Cognitive  Status: Within Functional Limits  Cognitive Assessment: Arousal/ Alertness;Orientation Level;Following Commands;Behavior;Safety Judgment;Insight  Arousal/Alertness: Alert  Orientation Level: Oriented X4  Behavior: Appropriate;Cooperative  Following Commands: Follows all commands and directions without difficulty  Safety Judgment: Good awareness of safety precautions    Vision  Overall Vision/ Perception: Impaired  Impairments: Basic Vision  Current Vision: Wears glasses only for reading       Right Upper Extremity   Right UE ROM: Grossly WFL as observed during functional activities  Right UE Strength: Grossly WFL (at least 3+/5) as observed during functional activities  Right Hand Function: Grossly WFL as observed during functional activity         Left Upper Extremity  Left UE ROM: Grossly WFL as observed during functional activities  Left UE Strength: Grossly WFL (at least 3+/5) as observed during functional activities  Left UE Hand Function: Grossly WFL as observed during functional activites         Neuromuscular  Overall Sensation: Impaired  Impairments: Light touch  Light Touch:  (decreased L foot)          Functional Mobility  Bed Mobility   Supine to Sit: Stand by assistance  Sit to Supine: Minimal assistance  Transfers  Sit to Stand: Contact guard assistance;up to assistive device;cues for hand placement  Sit to Stand Assistive Device: Rolling walker  Stand to Sit: Contact guard assistance;cues for hand placement;with assistive device  Stand to Sit Assistive Device:  Rolling walker  Toilet Transfers: Stand by assistance;increased time to complete task;with assistive device  Toilet Transfers Assistive Device: Rolling walker  Functional Mobility: Stand by assistance;with assistive device  Functional Mobility Assistive Device: Rolling walker  Unable to progress further due to: pt walks in hall for household distance and then becomes lightheaded and pale; pt returned to bed and with increased time, pt recovers.  See PT note for BP values once returned to supine  Balance  Sitting - Static: Independent  Sitting-Dynamic: Supervision   Standing-Static: Supervision;With Assistive Device  Standing-Static Assistive Device: Rolling walker  Standing-Dynamic: Stand by assistance;With Assistive Device  Standing-Dynamic Assistive Device: Rolling walker    Gait belt used: Yes    ADL  Feeding Deficit: Set-up;Beverage management  Lower Body Dressing: Moderate assistance  Lower Body Dressing Deficit: Don/doff R sock;Don/doff L sock  Location Assessed LE Dressing: Seated edge of bed  Toileting: Stand-by assistance  Toileting Deficit: Toileting hygiene  Location Assessed Toileting: Toilet         Position after Treatment/Safety Handoff  Position after therapy session: Bed  Details: RN present;Call light/ needs within reach  Alarms: Bed  Alarms Status: Activated and unable to be interfaced with call system    Plan  Plan  Treatment Interventions: ADL retraining, Activity Tolerance training, IADL retraining, Patient/Family training, Functional transfer training, Therapeutic Activity  OT Frequency:  (1-2 sessions)    The plan of care and recommendations assesses the patient's and/or caregiver's readiness, willingness, and ability to provide or support functional mobility and ADL tasks as needed upon discharge.    Goals   Goals to be met in: 1 week  Patient stated goal: to go home, to decrease pain during mobility and ADLs, to increase independence  Patient will complete supine to sit in prep for ADLs: Independent  Patient will complete toilet transfer: Modified Independent  Patient will complete toileting: Modified Independent  Patient will complete lower body dressing: Modified Independent  Miscellaneous Goal #1: Shower task assessment  Long Term Goal : = STG  Collaborated with: Patient    Patient/Family Education  Educated patient on the role of occupational therapy, OT goals, OT plan of care and discharge recommendation and fall prevention  strategies including need for supervision/ assistance with OOB activity and use of call light. patient  verbalized understanding.    OT Time  Start Time: 0840  Stop Time: 0910  Time Calculation (min): 30 min    OT Charges  $OT Evaluation Mod Complex 45 Min: 1 Procedure              Problem List  Patient Active Problem List   Diagnosis   ??? Thoracic or lumbosacral neuritis or radiculitis, unspecified   ??? Degeneration of lumbar or lumbosacral intervertebral disc   ??? Osteoporosis   ??? Routine health maintenance   ??? Lumbar radiculopathy   ??? Post-traumatic osteoarthritis of left knee      Past Medical History  Past Medical History:   Diagnosis Date   ??? Acquired absence of ovary, unilateral     Missing left ovary.   ??? Carpal tunnel syndrome on left 03/18/2016    Added automatically from request for surgery [id number]   ??? Closed fracture of upper end of tibia 12/18/2009   ??? 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   ??? History of fall     fell 10/2015   ??? Low back pain    ??? Osteoarthritis    ??? Osteoporosis    ???  Urticaria    ??? Varicella      Past Surgical History  Past Surgical History:   Procedure Laterality Date   ??? CESAREAN SECTION  1985   ??? HARDWARE REMOVAL  8/09    of left tibia plateau   ??? LEG SURGERY  05/09/06    fractured left tibia plateau   ??? RELEASE CARPAL TUNNEL Left 03/22/2016    Procedure: LEFT CARPAL TUNNEL RELEASE;  Surgeon: Kathrene Bongo, MD;  Location: [facility];  Service: Orthopedics;  Laterality: Left;   ??? TOTAL KNEE ARTHROPLASTY Left 11/09/2021    Procedure: LEFT TOTAL KNEE ARTHROPLASTY;  Surgeon: Skipper Cliche, MD;  Location: [facility];  Service: Orthopedics;  Laterality: Left;   ??? WRIST FRACTURE SURGERY Left 11/13/2015    Procedure: OPEN REDUCTION INTERNAL FIXATION LEFT DISTAL RADIUS FRACTURE;  Surgeon: Kathrene Bongo, MD;  Location: [facility];  Service: Orthopedics;  Laterality: Left;

## 2021-11-10 NOTE — Unmapped (Signed)
Ortho Nurse Clinician Note:    Chart Reviewed.    Diagnosis/Activity/WBS: Pt is s/p L TKA (6.6). WBAT to LLE.     Assessment: Pt seen sitting up in bed in NAD; daughter at bedside. PT and bedside RN also at bedside currently rechecking BP. Pt endorses tolerable pain that worsens with movement. Pt states that her goal is to discharge home tomorrow with family; her daughter confirms that she will be available to provide 24 hr assistance beginning tomorrow evening. Discussed importance of increasing PO intake to stabilize BP; pt verbalized understanding. Provided pt with Aquacel dressing for home dressing change and dicussed changing dressing with daughter. All questions answered. No further needs at this time.     Wound Care: Aquacel Dressing- may change dressing x1 if >50% is saturated or integrity is compromised. If 2nd Aquacel needs changed, please replace with gauze and Tegaderm. Dressing change PRN thereafter. Document the dressing change in a note.  Notify MD for any concerns of post-operative bleeding (i.e. >2x/day)    Ortho plan of care: OR plans complete. Pt hypotensive to 90s/50s and dizzy with therapy; per discussion with Ortho MD pt to receive 1 liter bolus. Continue pain management. PT/OT eval; pt has cleared for home with home PT and RW pending further mobility with PT tomorrow. SW aware of home needs and RW should be delivered to bedside today. Will need a shower chair rx at discharge, as it is not covered by insurance.     Educated pt on importance of activity at discharge to decrease risk for complications. Educated pt on side effects of narcotics. Appropriate bowel reg ordered. Educated pt on use of IS. Encouraged ambulation, ice, stool softeners, and IS. Discussed WBS, and wound care. Pt verbalized understanding.    DVT prophylaxis/Anticoagulation: ppx lovenox while in house. Will discharge on ASA 81 mg bid x 28 days postop per attending MD's protocol. Encourage frequent ambulation/scds.     PT  Recs: home with 24 hr assistance, home PT and RW pending increased gait and stair assessment     OT Recs: home with 24 hr assistance and shower chair     Dispo planning: home with family tomorrow evening pending increased gait and stair assessment with PT.     Patient is agreeable to plan of care.     Follow up has been scheduled with Granville Lewis PA 6.19 at 1450. Information placed in dc navigator.    Patient has no further questions at this time. Will continue to follow.    Levada Dy Jeraldine Loots, RN  Pager: [phone]  Office: [phone]

## 2021-11-10 NOTE — Unmapped (Addendum)
Physical Therapy  Physical Therapy  Initial Assessment and Treatment     Name: [name]  DOB: [birth year]  Attending Physician: Skipper Cliche, MD  Admission Diagnosis: Post-traumatic osteoarthritis of left knee [M17.32]  Date: 11/10/2021  Room: 6350/U6350  Reviewed Pertinent hospital course: Yes    Hospital Course PT/OT: 76 yo female presents s/p OR(6/6): L TKA, adductor canal block  Relevant PMH : L carpal tunnel syndrome, OA  Precautions: WBAT L LE  Activity Level: Ambulate with assistance    Assist: Co-evaluation performed    Assessment  Assessment: Impaired Bed Mobility, Impaired Transfers, Impaired Gait, Impaired Balance, Impaired Strength, Impaired ROM, Impaired Stair Negotiation, Impaired Activity Tolerance  Prognosis: Good  Pt's tolerance to therapy session was fair and limited by orthostatic hypotension with ambulation.  Pt will require additional assessment prior to d/c from [facility].    Recommendation  Recommendation: Home with 24 hour supervision/assistance, Home PT (pending increased gait and stair assessment)  Equipment Recommended: Rolling walker    Justification for DME ordered: [name]: Patient has decreased weight bearing or impaired balance putting them at risk for falling without use of a walker. They are unable to utilize crutches or a cane to provide adequate support    Outcome Measures  AM-PAC 6 Clicks Basic Mobility Inpatient Short Form: PT 6 Clicks Score: 20     Mobility Recommendations for Staff  Patient ability: Patient ambulates in room/ to bathroom  Assist needed: with 1 person assist  Equipment/ Precautions needed: Requires assistive device, Has precautions  Requires Assistive Device: Rolling walker (gait belt)  Precautions: WBAT L LE    Home Living/Prior Function  Patient able to provide accurate information at this time: Yes  Lives With: Alone  Assistance available: 24 hour assistance  Type of Home: House  Home Entry: More than 1 step to enter;with railing  Stairs to enter:  6  Home Layout: Bed/bath upstairs;1/2 bath on main level;Two level (Pt moved bed to first floor)  Stairs within: 1 flight  Bathroom Shower/Tub: Pension scheme manager: Midwife: none  Home Equipment: Single-point cane  Prior Function  Functional Mobility: Independent ( no assistive device)  ADL Assistance: Independent  IADL Assistance: Independent  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[employer])  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, reading, taking [place] course     Pain  Pain Score: 4   Pain Location: Leg (left)  Pain Descriptors: Sore  Pain Intervention(s): Repositioned;Ambulation/increased activity    Vision  Vision/Perception  Overall Vision/ Perception: Impaired  Impairments: Basic Vision  Current Vision: Wears glasses only for reading    Cognition  Overall Cognitive Status: Within Functional Limits  Cognitive Assessment: Arousal/ Alertness;Orientation Level;Following Commands;Behavior;Safety Judgment;Insight  Arousal/Alertness: Alert  Orientation Level: Oriented X4  Behavior: Appropriate;Cooperative  Following Commands: Follows all commands and directions without difficulty  Safety Judgment: Good awareness of safety precautions    Neuromuscular  Overall Sensation: Impaired  Impairments: Light touch  Light Touch:  (decreased L foot)    Upper Extremity  UE Assessment: Strength WFL ( at least 3+/5) as observed during functional activity    Lower Extremity  Lower Extremity  LE Assessment: Impaired  Impairments: Strength;ROM  LE Strength: R LE: WFL; L LE: knee ext 2/5, ankle DF WFL  LE ROM: L knee AAROM 5-70*    Functional Mobility  Bed Mobility   Supine to Sit: Supervision  Sit to Supine: Minimal assistance    Transfers  Sit to Stand: Contact  guard assistance;up to assistive device;cues for hand placement (x 1 rep from EOB, x 1 rep from toilet)  Sit to Stand Assistive Device: Rolling walker    Gait  Distance (in feet): 80'  Level of assistance: Contact Guard  assistance  Assistive Device: Rolling walker  Gait Characteristics: Steady;step-to pattern;R decreased step length;L decreased step length;decreased cadence;L Decreased stance time;L Flexed knee (initial toe strike with cues for heel strike with TKE)  Unable to progress further due to: pt became lightheaded and pallor.  Pt returned to bed with physical assistance.  Vitals noted below.  RN notified.    Balance  Sitting - Static: Independent  Sitting-Dynamic: Supervision   Standing-Static: Stand by assistance;With Assistive Device  Standing-Static Assistive Device: Rolling walker  Standing-Dynamic: Geophysicist/field seismologist;With Assistive Device  Standing-Dynamic Assistive Device: Rolling walker    Vitals     BP (MAP) HR  Supine    94/56 (70) 65  Supine after 3 mins  100/62 (73) 70 - symptoms subsiding     Treatment provided during/after evaluation  Total treatment time (minutes): 10 mins  Therapeutic exercise: L LE x 10 reps: AP, QS, GS, SAQ, heel slides, hip abduction  Gait belt used: Yes    Position after Therapy/Safety Handoff  Position after treatment and safety handoff  Position after therapy session: Bed  Details: RN notified;Call light/ needs within reach;cold applied  Alarms: Bed  Alarms Status: Activated and Interfaced with call system    Goals  Collaborated with: Patient  Patient Stated Goal: to go home  Goals to be met by: 11/17/21  Patient will transition from supine to sit: Independent, head of bed flat  Patient will transition from sit to supine: Independent, head of bed flat  Patient will transfer from sit to stand: Supervision, up to assistive device (to RW)  Patient will ambulate: Stand-By assistance, with assistive device, distance (in feet)   Ambulation Asssistance Device: Rolling walker  Distance (in feet): 150'  Patient will go up / down stairs: Will tolerate assessment  Miscellaneous Goal #1: Independent with L LE HEP  Miscellaneous Goal #2: L knee AAROM 0-90*  Long-term goal to be met by:  11/24/21  Long Term Goal : Pt will ambulate 300' with modified independence using LRAD     Patient/Family Education  Educated patient on the role of physical therapy, goals, plan of care, importance of increased activity, discharge recommendations, home exercise program, transfer training, gait training and weight bearing precautions and fall prevention strategies, including need for supervision/ assistance with OOB activity; patient verbalized understanding and demonstrated understanding. Handout(s) issued: TKA.    Plan  Plan  Treatment/Interventions: LE strengthening/ROM, Endurance training, Patient/family training, Equipment eval/education, Gait training, Therapeutic Exercise, Therapeutic Activity, Stair Training  PT Frequency: minimum 5x/week    The plan of care and recommendations assesses the patient's and/or caregiver's readiness, willingness, and ability to provide or support functional mobility and ADL tasks as needed upon discharge.        Time  Start Time: 0842  Stop Time: 0922  Time Calculation (min): 40 min    Charges   $PT Evaluation Mod Complex 30 Min: 1 Procedure    $Therapeutic Exercise: 8-22 mins                 Problem List  Patient Active Problem List   Diagnosis   ??? Thoracic or lumbosacral neuritis or radiculitis, unspecified   ??? Degeneration of lumbar or lumbosacral intervertebral disc   ??? Osteoporosis   ???  Routine health maintenance   ??? Lumbar radiculopathy   ??? Post-traumatic osteoarthritis of left knee      Past Medical History  Past Medical History:   Diagnosis Date   ??? Acquired absence of ovary, unilateral     Missing left ovary.   ??? Carpal tunnel syndrome on left 03/18/2016    Added automatically from request for surgery [id number]   ??? Closed fracture of upper end of tibia 12/18/2009   ??? 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   ??? History of fall     fell 10/2015   ??? Low back pain    ??? Osteoarthritis    ??? Osteoporosis    ??? Urticaria    ??? Varicella       Past Surgical History  Past  Surgical History:   Procedure Laterality Date   ??? CESAREAN SECTION  1985   ??? HARDWARE REMOVAL  8/09    of left tibia plateau   ??? LEG SURGERY  05/09/06    fractured left tibia plateau   ??? RELEASE CARPAL TUNNEL Left 03/22/2016    Procedure: LEFT CARPAL TUNNEL RELEASE;  Surgeon: Kathrene Bongo, MD;  Location: [facility];  Service: Orthopedics;  Laterality: Left;   ??? TOTAL KNEE ARTHROPLASTY Left 11/09/2021    Procedure: LEFT TOTAL KNEE ARTHROPLASTY;  Surgeon: Skipper Cliche, MD;  Location: [facility];  Service: Orthopedics;  Laterality: Left;   ??? WRIST FRACTURE SURGERY Left 11/13/2015    Procedure: OPEN REDUCTION INTERNAL FIXATION LEFT DISTAL RADIUS FRACTURE;  Surgeon: Kathrene Bongo, MD;  Location: [facility];  Service: Orthopedics;  Laterality: Left;

## 2021-11-10 NOTE — Unmapped (Signed)
[facility]  Case Management/Social Work Department Brief Assessment    Re-Admission within 30 days   Planned or unplanned? N/A  If planned: Reason?  If unplanned: Reason?   Discuss with patient any barriers to prevent re-admission?     Patient Information   Admission diagnosis: Left Total Knee Arthroplasty    Demographic verified and updated as needed    Support Systems   Production manager (POA or Next of Kin):  Wilson Singer   Phone #: [phone]    Relationship:  Daughter     Living Arrangements Prior to Hospitalization      Patient reports that prior to hospitalization, she lives alone in a 3 story house (5-7 steps to get inside, and 20-25 steps inside). Patient stated she is going to live on the first floor for a while when she returns home.      Community Resources Prior to Hospitalization   Home Health Care Services and Phone #: N/A     Home Respiratory Services: N/A   Provider and Phone #  Home O2:       Baseline Liter Flow:   BIPAP/CPAP:  Nebulizer:     Durable Medical Equipment: N/A    Discharge Plan   Anticipated discharge plan: Home with HHC for PT/OT, Rolling Walker    Company Name(s) and Phone #: [facility] [phone], [employer] [phone]      Anticipated discharge date: 11/11/21      Transportation at Discharge: Family      Please contact CM/SW for any further discharge planning needs.    Patient/Family aware and taking part in the discharge plan.  Patient/family educated that once post-acute care needs have been identified, a provider list applicable to the identified post-acute care needs as well as the insurance provider will be provided, and patient/family have the freedom to choose their provider(s); financial interest(s) are disclosed as appropriate.

## 2021-11-11 MED ORDER — oxyCODONE (ROXICODONE) 10 mg Tab
10 | ORAL_TABLET | ORAL | 0 refills | 6.00000 days | Status: AC | PRN
Start: 2021-11-11 — End: 2021-11-16
  Filled 2021-11-11: qty 10, 2d supply, fill #0

## 2021-11-11 MED ORDER — senna-docusate (SENNA-S) 8.6-50 mg per tablet
8.6-50 | ORAL_TABLET | Freq: Two times a day (BID) | ORAL | 0 refills | Status: AC | PRN
Start: 2021-11-11 — End: ?
  Filled 2021-11-11: qty 30, 15d supply, fill #0

## 2021-11-11 MED ORDER — acetaminophen (TYLENOL) 325 MG tablet
325 | ORAL_TABLET | Freq: Three times a day (TID) | ORAL | 0 refills | 11.00000 days | Status: AC
Start: 2021-11-11 — End: 2021-11-18
  Filled 2021-11-11: qty 100, 11d supply, fill #0

## 2021-11-11 MED ORDER — famotidine (PEPCID) 20 MG tablet
20 | ORAL_TABLET | Freq: Two times a day (BID) | ORAL | 0 refills | Status: AC
Start: 2021-11-11 — End: ?
  Filled 2021-11-11: qty 60, 30d supply, fill #0

## 2021-11-11 MED ORDER — naloxone (NARCAN) 4 mg/actuation Spry
4 | NASAL | 1 refills | Status: AC | PRN
Start: 2021-11-11 — End: ?

## 2021-11-11 MED FILL — GABAPENTIN 100 MG CAPSULE: 100 100 MG | ORAL | Qty: 1

## 2021-11-11 MED FILL — OXYCODONE 5 MG TABLET: 5 5 MG | ORAL | Qty: 1

## 2021-11-11 MED FILL — CELEBREX 200 MG CAPSULE: 200 200 mg | ORAL | Qty: 1

## 2021-11-11 MED FILL — TYLENOL 325 MG TABLET: 325 325 mg | ORAL | Qty: 3

## 2021-11-11 MED FILL — ENOXAPARIN 30 MG/0.3 ML SUBCUTANEOUS SYRINGE: 30 30 mg/0.3 mL | SUBCUTANEOUS | Qty: 0.3

## 2021-11-11 NOTE — Unmapped (Signed)
Explained to Pt not to get out of bed without calling for assistance.  Bed alarm set, call light, bedside table within reach, bed in lowest position, Pt alert and orient.  Has walker in room

## 2021-11-11 NOTE — Unmapped (Signed)
Physician Discharge Summary     Patient ID:  [name]  [id number]  76 y.o.  07/12/1945    Admit date: 11/09/2021    Discharge date and time: No discharge date for patient encounter.     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 Acquired absence of ovary, unilateral     Missing left ovary.   ??? Carpal tunnel syndrome on left 03/18/2016    Added automatically from request for surgery [id number]   ??? Closed fracture of upper end of tibia 12/18/2009   ??? 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   ??? History of fall     fell 10/2015   ??? Low back pain    ??? Osteoarthritis    ??? Osteoporosis    ??? Urticaria    ??? Varicella        Discharge Diagnoses: s/p  LEFT total knee arthroplasty  Same as above    Procedure: LEFT total knee arthroplasty on 11/09/21    Admission Condition: good    Discharged Condition: good    Indication for Admission: Patient is a 76 y.o. 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 Surgical options were discussed with the patient in office and surgery was scheduled.     Hospital Course: Pt was admitted for surgery and underwent a LEFT TKA by Dr Conley RollsLe . Procedure was performed without complications. Pt recovered in the PACU and then was admitted to Ortho floor. Pt pain was well controlled with IV & po pain medications. Pt tolerated diet. Pt had PT/OT consults while on the floor. Pt was LEFT WBAT. Therapy recommended that pt be discharged to home. Pt had DVT prophylaxis, including daily Lovenox injections, TED hose, SCDs, and mobilization. Pt remained hemodynamically stable. Social work met with pt to make arrangements for home. Pt was discharged in stable condition.     Consults: none; Physical Therapy; Occupational Therapy; Social Work    Disposition: Home or Self Care WITHOUT  Home Care Services    Patient Instructions:   Current Discharge Medication List      START taking these medications    Details   acetaminophen (TYLENOL) 325 MG tablet Take 3 tablets (975 mg total) by mouth 3 times a day for 7 days.  Qty: 63 tablet, Refills: 0      senna-docusate (SENNA-S) 8.6-50 mg per tablet Take 1 tablet by mouth 2 times a day as needed for Constipation.  Qty: 30 tablet, Refills: 0      aspirin 81 MG chewable tablet Chew 1 tablet (81 mg total) by mouth in the morning and at bedtime for 28 days.  Qty: 56 tablet, Refills: 0      famotidine (PEPCID) 20 MG tablet Take 1 tablet (20 mg total) by mouth 2 times a day.  Qty: 60 tablet, Refills: 0      naloxone (NARCAN) 4 mg/actuation Spry Apply 1 spray in one nostril if needed. Call 911. May repeat dose in other nostril if no response in 3 minutes.  Qty: 2 each, Refills: 1      oxyCODONE (ROXICODONE) 10 mg Tab Take 0.5-1 tablets (5-10 mg total) by mouth every 4 hours as needed for Pain for up to 7 days.  Qty: 10 tablet,  Refills: 0    Associated Diagnoses: Post-traumatic osteoarthritis of left knee         CONTINUE these medications which have NOT CHANGED    Details   alendronate (FOSAMAX) 35 MG tablet Take 1 tablet (35 mg total) by mouth every 7 days.         STOP taking these medications       ibuprofen (ADVIL,MOTRIN) 200 MG tablet Comments:   Reason for Stopping:               Pt will require a narcotic Rx >30 morphine equivalent dose per day at discharge due to having acute pain associated with major orthopedic surgery (Total knee arthroplasty)   Activity: PT/OT evaluate & treat; LEFT LE WBAT; OOB TID @ minimum; ice/elevate prn; no pillow under knee (heel only)   Diet: regular diet  Wound Care: Keep Aquacel dressing in place until POD#7, then dry sterile dressing changes daily prn if soiled/saturated; monitor for signs/symptoms of infection - including fever >101.5, redness, warmth and increased pain & swelling.    Future Appointments   Date Time  Provider Department Center   11/22/2021  2:50 PM Mickle Mallory, PA [facility]   01/11/2022 11:10 AM Eldridge Scot, MD [facility]     Skipper Cliche, MD  [address]  [address]  [place] [address]  [phone]    Follow up on 11/22/2021  Please arrive 15 minutes early for your appointment at 2:50pm. Thank you    CCM [facility]  [address] [place], [place]  [place] [address]  [phone]          Signed:  Brayon Bielefeld  11/11/2021  3:50 PM

## 2021-11-11 NOTE — Unmapped (Signed)
[facility] faxed over hhc orders and dc summary via Epic.      Fonnie Mu  Care Coordinator Assistant  Care Management Services  [phone]

## 2021-11-11 NOTE — Unmapped (Signed)
    Case Manager/Social Worker Discharge Summary     Patient name: [name]                                        Patient MRN: [id number]  DOB: 01/25/1946                              Age: 76 y.o.              Gender: female  Patient emergency contact: Extended Emergency Contact Information  Primary Emergency Contact: Romualdo BolkKelly,Olivia   [place]  Mobile Phone: [phone]  Relation: Daughter      Attending provider: Skipper Clicheheodore Toan Le, MD  Primary care physician: Koleen NimrodSCOTT D MEYER, MD    The MD has indicated that the patient is ready for discharge.  [name] CarLois R Sproule was referred and [facility]  The patient will be transported by family.      Transfer Mode/Level of Care: Family         DC Summary and HHC order have been faxed to facility.     The plan has been reviewed:     Patient/Family Informed of Discharge Plan: Yes    Plan Reviewed With Patient, Family, or Significant Other: Yes    Patient and or family are aware and in agreement with the discharge plan: Yes             Plan reviewed with MD and other members of the health care team: Yes  Care Plan Completed: Yes    No further CM/SW needs.    This plan has been reviewed with the multi-disciplinary team.     Treatment Preferences    Treatment Preferences: Distance    Post-Discharge Goals    Patient's Post-Discharge goals: To return home safely.    Post Acute Care Provider Information:           Advertising copy[facility]Community Services at Discharge  [facility]  [facility]Phone # [facility] [phone])  Home Health Services Types at Discharge: PT/OT/SLP  DME Needs post discharge: Rolling walker  D[facility]/Phone # [facility] [phone])    Morrie SheldonAshley Dutch Ing HerrickBSW, [place]LSW  [phone]

## 2021-11-11 NOTE — Unmapped (Signed)
Nursing Overnight Progress Note    Significant Events During Shift  None    Patient/Family Concerns  Evening/Overnight Visitors: none  Concerns: none    Assessment  Nursing time demands: low  IV access: has IV access, adequate and functioning  Sitter requirements:  no    Mental Status  Mental Status for the past 14 hrs:   Level of Consciousness Orientation Level Cognition   11/10/21 2115 Alert Oriented X4 Ability to abstract;Appropriate judgement;Appropriate safety awareness;Appropriate attention/concentration;Appropriate for developmental age;Follows 2-step commands;Follows simple commands   11/11/21 0200 Alert Oriented X4 Appropriate judgement       Calls to Physician Team  No data found.    Medications  [phone] - Medications Not Given  (last 12 hrs)         ** No medications to display **          Diet/Meals Consumed (past 24 hours)  Nutrition Assessment for the past 24 hrs:   Diet Type Feeding Percent Meals Eaten (%) Appetite   11/10/21 0927 Regular Able to feed self 50 % Fair   11/10/21 1855 Regular Able to feed self 90 % Fair   11/10/21 1940 Regular Able to feed self -- --   11/10/21 2041 -- -- 100 % --   11/10/21 2115 Regular Able to feed self -- --   11/10/21 2227 -- -- 0 % --   11/11/21 0200 Regular Able to feed self -- --

## 2021-11-11 NOTE — Unmapped (Signed)
Ortho Nurse Clinician Note:     Chart Reviewed.    Diagnosis/Activity/WBS: pt is s/p L TKA (6/6). WBAT LLE.     Wound Care: Aquacel Dressing- may change dressing x1 if >50% is saturated or integrity is compromised. If 2nd Aquacel needs changed, please replace with gauze and Tegaderm. Dressing change PRN thereafter. Document the dressing change in a note.  Notify MD for any concerns of post-operative bleeding (i.e. >2x/day)    Ortho plan of care: OR plans complete at this time. Continue routine post-op management. Pt BP today stable, 138/75. Continue to monitor pain. Continue PT/OT. Pt cleared for home by therapy. RW delivered to the bedside yesterday. [facility]. SW assisting with discharge planning. Patient was given a extra Aquacel dressing for discharge.    DVT prophylaxis/Anticoagulation:  ppx Lovenox 40mg  daily while inpatient will transition to ASA 81mg  BID at discharge for 28 days post-op. Encourage OOB and SCD's.     PT Recs: Recommendation: Home with 24 hour supervision/assistance, Home PT (pending increased gait and stair assessment)  Equipment Recommended: Rolling walker  OT Recs: Recommendation: Home with 24 hour supervision/assistance  Equipment Recommendations: Shower chair    Dispo planning: home today     Follow up has been scheduled prior to surgery with Dr. Conley RollsLe PA on 6/19 at 1450. Information in discharge navigator.     Will continue to follow.    Radonna RickerMelissa Mahala Rommel RN, BSN  Pager: [phone]

## 2021-11-11 NOTE — Unmapped (Signed)
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 Pt would benefit from continued therapy at home upon d/c from [facility].    Recommendation  Recommendation: Home with 24 hour supervision/assistance, Home PT  Equipment Recommended: Rolling walker    Justification for DME ordered: [name]: Patient has decreased weight bearing or impaired balance putting them at risk for falling without use of a walker. They are unable to utilize crutches or a cane to provide adequate support    Outcome Measures  AM-PAC 6 Clicks Basic Mobility Inpatient Short Form: PT 6 Clicks Score: 20     Mobility Recommendations for Staff  Patient ability: Patient ambulates in hallway  Assist needed: with 1 person assist  Equipment/ Precautions needed: Requires assistive device, Has precautions  Requires Assistive Device: Rolling walker (gait belt)  Precautions: WBAT L LE    Cognition  Arousal/Alertness: Alert  Orientation Level: Oriented X4  Behavior: Appropriate;Cooperative  Following Commands: Follows all commands and directions without difficulty  Safety Judgment: Good awareness of safety precautions  Insight: Demonstrated intact insight into limitation and abilities to complete ADL's safely     Pain  Pain Score: 6   Pain Location: Leg (left)  Pain Descriptors: Lambert Mody;Aching  Pain Intervention(s): Repositioned;Ambulation/increased activity      Mobility  Bed Mobility  Supine to Sit: Supervision;head of bed elevated;towards the left    Transfers  Sit to Stand: Stand-by assistance;up to assistive device  Sit to Stand Assistive Device: Rolling walker    Gait  Distance: 150'  Level of Assistance: Stand By assistance  Assistive Device: Rolling walker  Gait Characteristics: Steady;swing-through pattern;decreased cadence;L decreased step length;R decreased step length;L Decreased stance time    Stairs  Number of Stairs: 10  Stair Management Assistance: Contact Guard assist  Stair Management Technique: One rail L;Step to pattern;Forwards;With walker    Balance  Sitting - Static: Independent  Sitting - Dynamic: Supervision  Standing - Static: Supervision;With Assistive Device   Standing-Static Assistive Device: Rolling walker  Standing - Dynamic: Stand-by assistance;With Assistive Device  Standing-Dynamic Assistive Device: Rolling walker    Gait belt used: Yes    Exercise  Therapeutic Exercises  Side: left  Extremity: lower extremity  Type of Exercise: AROM  Supine  Exercises: Ankle pumps;Quad sets;Gluteal sets;Short arc quads;ABduction/ADduction;Heel slides  Reps/Comments: x 10 reps  Seated  Exercises: Long arc quads;Knee flexion  Reps/Comments: x 10 reps    Position after Treatment and Safety Handoff  Position after treatment and safety handoff  Position after therapy session: Recliner  Details: RN notified;Call light/ needs within reach  Alarms: Chair  Alarms Status: Activated and Interfaced with call system    Goals  Goals Met: Gait    Collaborated with: Patient  Patient Stated Goal: to go home  Goals to be met by: 11/17/21  Patient will transition from supine to sit: Independent, head of bed  flat  Patient will transition from sit to supine: Independent, head of bed flat  Patient will transfer from sit to stand: Supervision, up to assistive device (to RW)  Patient will ambulate: Supervision, with assistive device, distance (in feet)   Ambulation Asssistance  Device: Rolling walker  Distance (in feet): 250'  Patient will go up / down stairs: Supervision, Number of stairs, With assistive device, with handrail  Stairs Assistive Device: Rolling walker (folded)  Number of Stairs: 10  Hand Rail: Left handrail  Miscellaneous Goal #1: Independent with L LE HEP  Miscellaneous Goal #2: L knee AAROM 0-90*  Long-term goal to be met by: 11/24/21  Long Term Goal : Pt will ambulate 300' with modified independence using LRAD    Patient/Family Education  Educated patient on the role of physical therapy, goals, plan of care, importance of increased activity, discharge recommendations, home exercise program, transfer training, gait training, stair training and weight bearing precautions and fall prevention strategies, including need for supervision/ assistance with OOB activity; patient verbalized understanding and demonstrated understanding. Handout(s) issued: none.    Plan  Plan  Treatment/Interventions: LE strengthening/ROM, Endurance training, Patient/family training, Equipment eval/education, Gait training, Therapeutic Exercise, Therapeutic Activity, Stair Training  PT Frequency: minimum 5x/week    The plan of care and recommendations assesses the patient's and/or caregiver's readiness, willingness, and ability to provide or support functional mobility and ADL tasks as needed upon discharge.      Time  Start Time: 1357  Stop Time: 1437  Time Calculation (min): 40 min    Charges       $Gait/Mobility: 8-22 mins  $Therapeutic Exercise: 8-22 mins  $Therapeutic Activity: 1 unit                   Problem List  Patient Active Problem List   Diagnosis   ??? Thoracic or lumbosacral neuritis or radiculitis, unspecified   ??? Degeneration of lumbar or lumbosacral intervertebral disc   ??? Osteoporosis   ??? Routine health maintenance   ??? Lumbar radiculopathy   ??? Post-traumatic osteoarthritis of left knee        Past Medical History  Past Medical History:   Diagnosis Date   ??? Acquired absence of ovary,  unilateral     Missing left ovary.   ??? Carpal tunnel syndrome on left 03/18/2016    Added automatically from request for surgery [id number]   ??? Closed fracture of upper end of tibia 12/18/2009   ??? 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   ??? History of fall     fell 10/2015   ??? Low back pain    ??? Osteoarthritis    ??? Osteoporosis    ??? Urticaria    ??? Varicella         Past Surgical History  Past Surgical History:   Procedure Laterality Date   ??? CESAREAN SECTION  1985   ??? HARDWARE REMOVAL  8/09    of left tibia plateau   ??? LEG SURGERY  05/09/06    fractured left tibia plateau   ??? RELEASE CARPAL TUNNEL Left 03/22/2016    Procedure: LEFT CARPAL TUNNEL RELEASE;  Surgeon: Kathrene Bongo, MD;  Location: [facility];  Service: Orthopedics;  Laterality: Left;   ??? TOTAL KNEE ARTHROPLASTY Left 11/09/2021    Procedure: LEFT TOTAL KNEE ARTHROPLASTY;  Surgeon: Skipper Cliche, MD;  Location: [facility];  Service: Orthopedics;  Laterality: Left;   ??? WRIST FRACTURE SURGERY Left  11/13/2015    Procedure: OPEN REDUCTION INTERNAL FIXATION LEFT DISTAL RADIUS FRACTURE;  Surgeon: Kathrene Bongo, MD;  Location: [facility];  Service: Orthopedics;  Laterality: Left;

## 2021-11-11 NOTE — Unmapped (Signed)
Pt is aware and agreeable to discharge to home this evening. Pt's PIV removed with no issues. Pt's daughter and son in law at bedside for discharge instructions. Pt has discharge medications at bedside. AVS and discharge instructions reviewed with pt and family. All questions answered. Pt is leaving with polar pack and aquacel dressing and all personal belongings. Transportation notified to pick up patient and take to discharge area.

## 2021-11-11 NOTE — Unmapped (Signed)
ORTHOPAEDIC SURGERY PROGRESS NOTE    ADMIT DATE:  11/09/2021    S:  No acute events overnight, patient expecting to discharge home today.     O:  Vitals:    11/10/21 1311 11/10/21 1603 11/10/21 2116 11/10/21 2313   BP: 111/84 116/69 131/72 125/64   BP Location: Left arm Left arm Left arm Left arm   Patient Position: Lying Lying Sitting Lying   Pulse: 77 79 89 82   Resp:  16  15   Temp:  98.4 ??F (36.9 ??C)  99 ??F (37.2 ??C)   TempSrc:  Oral  Oral   SpO2:  100%  95%   Weight:       Height:           General:  NAD  CV/Pulmonary:  breathing unlabored    MSK:  L LE  Dressing c/d/i   SILT sp/dp/t/sural/saphenous  EHL/TA/GS intact  Toes up and down  Cap refill < 2 sec      A/P:  [name] is a 76 y.o. female who is s/p  LEFT TOTAL KNEE ARTHROPLASTY       -IV Abx:  ancef post-op x 2 doses  -WBS: wbat LLE   -PT/OT: eval and treat  -Diet: regular   -OR plans: complete   -Anticoagulation: lovenox  -Dispo planning: dispo home       Karene Bracken Ether Griffins MD  ORTHOPAEDIC SURGERY RESIDENT

## 2021-11-11 NOTE — Unmapped (Signed)
REFERRAL FOR HOME HEALTH SERVICES FORM     Patient name: [name]  Patient MRN: [id number]  DOB: Feb 28, 1946  Age: 76 y.o.  Gender: female  SSN: [id number]  Address: [address] [place] [address]     Phone number: There are no phone numbers on file.   Patient emergency contact: Extended Emergency Contact Information  Primary Emergency Contact: Romualdo Bolk [place]  Mobile Phone: [phone]  Relation: Daughter    Date of admission: 11/09/2021  Date of discharge: 11/11/2021  Attending provider: Skipper Cliche, MD  Primary care physician: [name], MD    Code status: Full Code  Allergies:   Allergies   Allergen Reactions   ??? Iodine Anaphylaxis, Hives and Shortness Of Breath     IVP dye for a kidney function test greater than 20 years ago   ??? Iodine And Iodide Containing Products Anaphylaxis     Respiratory Distress  IVP dye for a kidney function test greater than 20 years ago  Respiratory Distress   ??? Iodinated Contrast Media      Shortness of breath, hives.   ??? Shellfish Containing Products      Advised to avoid shellfish due to allergy to iodine.       Lawyer Information                MEDICARE/MEDICARE A AND B Phone: --    Subscriber: Demeshia, [place] Subscriber#: [id number]    Group#: -- Precert#: --        [employer] [place] Phone: --    SubscriberPatriece, Archbold Subscriber#: [id number]    Group#: -- Precert#: --          Diagnoses Present on Admission   Primary Diagnosis: L knee OA  Discharge Diagnosis: s/p L TKA ( 6.6.23)     Prognosis: good  Rehabilitation potential: good    Diet     Diet Orders          Diet regular starting at 06/06 1505           Regular Diet    Services Required   Physical Therapy: Plan  Treatment/Interventions: LE strengthening/ROM, Endurance training, Patient/family training, Equipment eval/education, Gait training, Therapeutic Exercise, Therapeutic Activity, Stair Training  PT Frequency: minimum  5x/week  Recommendation  Recommendation: Home with 24 hour supervision/assistance, Home PT  Equipment Recommended: Rolling walker    Occupational Therapy: Plan  Treatment Interventions: ADL retraining, Activity Tolerance training, IADL retraining, Patient/Family training, Functional transfer training, Therapeutic Activity  OT Frequency:  (1-2 sessions)  Recommendation  Recommendation: Home with 24 hour supervision/assistance  Equipment Recommendations: Shower chair    Weight bearing status: full as tolerated left leg    Needs 24 hour supervision due to cognitive impairment: No    Discharge Medications   Medications:  Current Discharge Medication List      START taking these medications    Details   acetaminophen (TYLENOL) 325 MG tablet Take 3 tablets (975 mg total) by mouth 3 times a day for 7 days.  Qty: 63 tablet, Refills: 0      senna-docusate (SENNA-S) 8.6-50 mg per tablet Take 1 tablet by mouth 2 times a day as needed for Constipation.  Qty: 30 tablet, Refills: 0      aspirin 81 MG chewable tablet Chew 1 tablet (81 mg total) by mouth in the morning and at bedtime for 28 days.  Qty: 56  tablet, Refills: 0      famotidine (PEPCID) 20 MG tablet Take 1 tablet (20 mg total) by mouth 2 times a day.  Qty: 60 tablet, Refills: 0      naloxone (NARCAN) 4 mg/actuation Spry Apply 1 spray in one nostril if needed. Call 911. May repeat dose in other nostril if no response in 3 minutes.  Qty: 2 each, Refills: 1      oxyCODONE (ROXICODONE) 10 mg Tab Take 0.5-1 tablets (5-10 mg total) by mouth every 4 hours as needed for Pain for up to 7 days.  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         STOP taking these medications       ibuprofen (ADVIL,MOTRIN) 200 MG tablet Comments:   Reason for Stopping:                    Discharge Specific Orders   Discharge specific orders:  WOUND CARE: leave sutures/staples/steri-strips in place  no lotions, creams or ointments  keep clean and dry  Aquacel Dressing:  Please Remove Tan Aquacel dressing on 6.13.23, assess wound, and place another Aquacel dressing over surgical incision. This second dressing should remain in place until pt is seen in follow up by Ortho PA on 6.19.23.  Activity as tolerated  Out of bed to chair three times a day as tolerated  Ambulate three times a day  May shower with Aquacel dressing in place. No tub bathing, swimming or soaking.  Neurovascular checks to affected extremities daily  Ice and elevate injured extremities  Isolation     Patient Isolation Status     None to display          Vitals     Patient Vitals for the past 4 hrs:   BP Temp Temp src Pulse Resp SpO2   11/11/21 1537 138/75 98.2 ??F (36.8 ??C) Oral 93 16 100 %       Equipment/Supplies   Shower Chair  Agricultural consultant  No current labs  Ordering Physician: NPI  Skipper Cliche, MD]    Physician Certification   Further, I certify that my clinical findings support that this patient is homebound (i.e. absences from home require considerable and taxing effort and are for medical reasons or religious services or infrequently or short duration when for other reasons) due to decrease mobility it would be a taxing effort to receive outpatient services.    My signature below is to certify that this patient is under my care and that I, or nurse practitioner, or a physician assistant working with me, had a face-to-face encounter with this is patient on: 11/11/2021     Follow-up Appointments and [facility] Discharge Physician Name     Future Appointments   Date Time Provider Department Center   11/22/2021  2:50 PM Mickle Mallory, PA [facility] ORTH [facility]   01/11/2022 11:10 AM Eldridge Scot, MD [facility] M[facility]     Skipper Cliche, MD  [address]  [address]  [place] [address]  [phone]    Follow up on  11/22/2021  Please arrive 15 minutes early for your appointment at 2:50pm. Thank you    [facility]  Skilled Care  [address] [place], [place]  [place] [address]  [phone]          Discharging Physician Signature and Credentials   Discharging Physician: Electronically signed by Trixie Deis, MD    11/11/2021, 3:48 PM    Physician to follow up Information   PCP: [name], MD  PCP address: [address] / [place] [address]  PCP phone number: [phone]  PCP fax number: [phone]    If PCP is not following patient, type physician contact information here: Physician to follow is: Dr. Dorothy Spark and his phone/fax numbers are: [phone]    Discharge Planner and Credentials     Provider/Company Name and Contact Number:           [facility] at Discharge  [facility] at [facility] post discharge: Home Health Care, DME  Home Health Care Name/Phone # post discharge: [facility] [phone]  Home Health Services Types at Discharge: PT/OT/SLP  DME Needs post discharge: Rolling walker  DME Name/Phone # post discharge: [facility] [phone]    Discharge Planner Name and Telephone Number: Fonnie Mu, CCA [phone]

## 2021-11-11 NOTE — Unmapped (Signed)
Pt stated pain 9/10, pain med given, will continue to monitor

## 2021-11-12 NOTE — Unmapped (Signed)
Surgical admission does not qualify for TCM HDF for PCF

## 2021-11-16 MED ORDER — oxyCODONE (ROXICODONE) 10 mg Tab
10 | ORAL_TABLET | ORAL | 0 refills | 6.00000 days | Status: AC | PRN
Start: 2021-11-16 — End: 2021-11-23

## 2021-11-22 ENCOUNTER — Ambulatory Visit: Admit: 2021-11-22 | Discharge: 2021-11-22 | Payer: MEDICARE

## 2021-11-22 DIAGNOSIS — Z471 Aftercare following joint replacement surgery: Secondary | ICD-10-CM

## 2021-11-22 MED ORDER — oxyCODONE-acetaminophen (PERCOCET) 5-325 mg per tablet
5-325 | ORAL_TABLET | Freq: Four times a day (QID) | ORAL | 0 refills | Status: AC | PRN
Start: 2021-11-22 — End: 2021-11-29
  Filled 2021-11-22: qty 28, 7d supply, fill #0

## 2021-11-22 NOTE — Unmapped (Signed)
Post operative bandage was removed per the doctor's instruction.   Patient tolerated this well with no complications. Patient was given instructions regarding showering/bathing.

## 2021-11-22 NOTE — Unmapped (Signed)
Staples were removed per the doctor's instruction.   Patient tolerated this well with no complications.   Steri strips were applied.   Patient was given instructions regarding showering/bathing.

## 2021-11-22 NOTE — Unmapped (Signed)
CC: post op TKA    Subjective:  [name] is a 76 y.o. female here for a postoperative visit following a LEFT total knee arthroplasty on 11/09/2021. Patients pain is tolerable with current medications. They are ambulating w/wo assistive device. They are on ASA 81mg  BID for DVT prophylaxis. Denies any fever, chills, numbness, tingling, or wound complications.     Objective:  There were no vitals filed for this visit.  General: Well nourished, normally developed no apparent distress    Respiratory: Regular and unlabored  MSK:  Surgical incision appears to be healing well without signs of infection, extending erythema or drainage.  Improved swelling has wrinkle sign  Resolving swelling thigh, knee  And ankle  Skin staples intact  5/5 EHL/TA/G   Sensation intact to light touch DP/SP/T nerves  Foot WWP with a brisk DP pulse  Knee AROM is 0-90    Imaging:  No results found.    Assessment:  [name] is a 76 y.o. female here for a postoperative visit following a LEFT total knee arthroplasty on 11/09/2021    Plan:  1. Continue weightbearing as tolerated   2. Work on AROM of the knee (flexion,extension, hamstring & calf stretching)  3. Continue DVT prophylaxis medications for another 2 weeks postoperative  4. Elevate often to reduce swelling  5. Refill Rx percocet 5/325mg  PO Q6Hrs prn pain #28. Attempt to wean off medications as soon as possible.   6. OK to shower but no soaking  7. Follow-up in approximately 4-6 weeks with repeat knee xrays

## 2021-12-02 MED ORDER — gabapentin (NEURONTIN) 100 MG capsule
100 | ORAL_CAPSULE | Freq: Two times a day (BID) | ORAL | 0 refills | 30.00000 days
Start: 2021-12-02 — End: 2022-02-14

## 2021-12-14 ENCOUNTER — Ambulatory Visit: Admit: 2021-12-14 | Discharge: 2021-12-14 | Payer: MEDICARE

## 2021-12-14 DIAGNOSIS — R197 Diarrhea, unspecified: Secondary | ICD-10-CM

## 2021-12-14 NOTE — Unmapped (Signed)
Etiology not clear.  But since recent post op, need to rule out C diff.     Will check labs  She will do another COVID test.   BRAT diet.     Patient understands and agrees with plan.     This was a video visit, including two-way audio and video communication, in lieu of an in-person visit. The patient provided verbal consent to use the video visit.    I spent 10 min.  on this call conducting an interview, performing a limited exam by video, with >50% spent educating the patient on my assessment and plan. stART 4.06 PM STOP 4.16 pm

## 2021-12-14 NOTE — Unmapped (Signed)
[facility]   PRIMARY CARE AT [facility] OFFICE  [address]  [place] [address]    Name:  [name] Date of Birth: 1946-05-30 (76 y.o.)   MRN: [id number]    Date of Service:  12/14/2021     Subjective:     No chief complaint on file.    History of Present Illness:  [name] is a(n) 76 y.o. female here today for the following:   Telemedicine visit with patient consent    Pt here for c/o diarrhea since last wed.  S/p knee replacement.  About 1 month ago.     Took expired COVID test indetewrminate    No fevers.  Diarrhea is better but after eating something, usually has a BM.  Loose stools.    Taking fluids well.        Current Outpatient Medications   Medication Sig Dispense Refill    alendronate (FOSAMAX) 35 MG tablet Take 1 tablet (35 mg total) by mouth every 7 days.      famotidine (PEPCID) 20 MG tablet Take 1 tablet (20 mg total) by mouth 2 times a day. 60 tablet 0    gabapentin (NEURONTIN) 100 MG capsule Take 2 capsules (200 mg total) by mouth in the morning and at bedtime. 90 capsule 0    naloxone (NARCAN) 4 mg/actuation Spry Apply 1 spray in one nostril if needed. Call 911. May repeat dose in other nostril if no response in 3 minutes. 2 each 1    senna-docusate (SENNA-S) 8.6-50 mg per tablet Take 1 tablet by mouth 2 times a day as needed for Constipation. 30 tablet 0     Current Facility-Administered Medications   Medication Dose Route Frequency Provider Last Rate Last Admin    bupivacaine (MARCAINE) 0.5 % (5 mg/mL) injection 10 mg  2 mL Subcutaneous Once [employer], PA        lidocaine 10 mg/mL (1 %) injection 2 mL  2 mL Subcutaneous Once Marissa Lindsey Baum, [place]        triamcinolone acetonide (KENALOG-40) injection 80 mg  80 mg Intra-articular Once Duffy Bruce, [place]          Review of Systems   Constitutional: Negative for chills, fatigue and fever.   HENT: Negative for congestion and sore throat.    Respiratory: Negative for cough,  shortness of breath and wheezing.    Cardiovascular: Negative for chest pain.   Musculoskeletal: Negative for myalgias.   Neurological: Negative for seizures.   Psychiatric/Behavioral: Negative for behavioral problems and confusion.   All other systems reviewed and are negative.           Objective:   There were no vitals filed for this visit.  There is no height or weight on file to calculate BMI.        Physical Exam  Nursing note reviewed.   Pulmonary:      Effort: Pulmonary effort is normal. No respiratory distress.   Neurological:      Mental Status: She is alert and oriented to person, place, and time. Mental status is at baseline.   Psychiatric:         Mood and Affect: Mood normal.         Behavior: Behavior normal.         Thought Content: Thought content normal.                   Assessment/Plan:  Diarrhea of presumed infectious origin  Etiology not clear.  But since recent post op, need to rule out C diff.     Will check labs  She will do another COVID test.   BRAT diet.     Patient understands and agrees with plan.     This was a video visit, including two-way audio and video communication, in lieu of an in-person visit. The patient provided verbal consent to use the video visit.    I spent 10 min.  on this call conducting an interview, performing a limited exam by video, with >50% spent educating the patient on my assessment and plan. stART 4.06 PM STOP 4.16 pm                Return if symptoms worsen or fail to improve.         Fortino Sic, MD

## 2021-12-15 NOTE — Unmapped (Signed)
Patient had a video visit with Dr. Fidel LevySostok yesterday for diarrhea for several days. She did pickup her stool kit and will try to get it back later today or tomorrow. In the meantime she wanted to advise him of a new symptom she developed overnight. She woke up with a swollen lower lip, angioedema, she called it. She does have a history of this 10-15 years ago but they never figured out what was the cause. She took 2 Benadryl and it's better but still there a bit.

## 2021-12-15 NOTE — Unmapped (Signed)
Ok thanks for update.  Don't know what would cause it.  If recurs should seek immediate medical attention  MS

## 2021-12-15 NOTE — Unmapped (Signed)
I called and notified pt

## 2021-12-17 ENCOUNTER — Other Ambulatory Visit: Admit: 2021-12-17 | Payer: MEDICARE

## 2021-12-17 DIAGNOSIS — R197 Diarrhea, unspecified: Secondary | ICD-10-CM

## 2021-12-20 NOTE — Unmapped (Signed)
Relayed info to pt.  Pt verbalized understanding

## 2021-12-20 NOTE — Unmapped (Signed)
Pt calling asking for results from her lab from 7/14

## 2021-12-20 NOTE — Unmapped (Signed)
Sounds like if she is feeling better, then no need for further work up.    If recurs, should make appt.  MS

## 2021-12-20 NOTE — Unmapped (Signed)
Called pt to clarify.  Pt is checking on C-diff labs.  Pt reports feeling better, she had diarrhea yesterday, but sxs as of today. Please advise

## 2021-12-28 ENCOUNTER — Ambulatory Visit: Admit: 2021-12-28 | Discharge: 2021-12-28 | Payer: MEDICARE

## 2021-12-28 DIAGNOSIS — R197 Diarrhea, unspecified: Secondary | ICD-10-CM

## 2021-12-28 NOTE — Unmapped (Signed)
Having diarrhea on and off will re order c diff.  Patient provided instructions on proper collection and type of specimen to collect.   Patient understands and agrees with plan.

## 2021-12-28 NOTE — Unmapped (Signed)
Collect appropriate stool specimen as instructed.   Follow up once results are available.

## 2021-12-28 NOTE — Unmapped (Signed)
[facility]  [place] PRIMARY CARE AT [facility] OFFICE  [address]  [place] [address]    Name:  [name] Date of Birth: 1946/01/24 (76 y.o.)   MRN: [id number]    Date of Service:  12/28/2021     Subjective:     Chief Complaint   Patient presents with    Diarrhea     Pt had a TKR 7 wks ago, a few weeks later developed diarrhea.  Seems to resolve This past Sat diarrhea came back. She was ising the [employer] She has lost 4 lbs. Sunday okay Yesterday back to diarrhea      History of Present Illness:  [name] is a(n) 76 y.o. female here today for the following:     Pt. here today for an urgent visit. Med, problem and allergy list updated today.     Pt. Here due to recurrent diarrhea.  Had TKR 7 weeks ago, then developed diarrhea  seen on 12-14-21 via video visit.  Ordered c diff. But had solid stools so canceled.  Used [employer] and felt better but Saturday  diarrhea recurred. After eating     No diarrhea today,  ate toast and coffee for breakfast             Current Outpatient Medications   Medication Sig Dispense Refill    alendronate (FOSAMAX) 35 MG tablet Take 1 tablet (35 mg total) by mouth every 7 days.      famotidine (PEPCID) 20 MG tablet Take 1 tablet (20 mg total) by mouth 2 times a day. 60 tablet 0    gabapentin (NEURONTIN) 100 MG capsule Take 2 capsules (200 mg total) by mouth in the morning and at bedtime. 90 capsule 0    naloxone (NARCAN) 4 mg/actuation Spry Apply 1 spray in one nostril if needed. Call 911. May repeat dose in other nostril if no response in 3 minutes. 2 each 1    senna-docusate (SENNA-S) 8.6-50 mg per tablet Take 1 tablet by mouth 2 times a day as needed for Constipation. 30 tablet 0     Current Facility-Administered Medications   Medication Dose Route Frequency Provider Last Rate Last Admin    bupivacaine (MARCAINE) 0.5 % (5 mg/mL) injection 10 mg  2 mL Subcutaneous Once [employer], PA        lidocaine 10 mg/mL (1 %) injection 2 mL  2  mL Subcutaneous Once Marissa Lindsey Baum, [place]        triamcinolone acetonide (KENALOG-40) injection 80 mg  80 mg Intra-articular Once Duffy Bruce, [place]          Review of Systems   Constitutional:  Negative for chills, fatigue and fever.   HENT:  Negative for congestion and sore throat.    Respiratory:  Negative for cough, shortness of breath and wheezing.    Cardiovascular:  Negative for chest pain.   Musculoskeletal:  Negative for myalgias.   Neurological:  Negative for seizures.   Psychiatric/Behavioral:  Negative for behavioral problems and confusion.    All other systems reviewed and are negative.         Objective:     Vitals:    12/28/21 0923   BP: 96/62   Pulse: 79   Temp: 98.4 F (36.9 C)   SpO2: 97%   Weight: (!) 90 lb (40.8 kg)     Body mass index is 15.45 kg/m.  Physical Exam  Vitals and nursing note reviewed.   Constitutional:       General: She is not in acute distress.     Appearance: Normal appearance. She is well-developed. She is not diaphoretic.   HENT:      Head: Normocephalic and atraumatic.      Right Ear: External ear normal.      Left Ear: External ear normal.   Eyes:      Conjunctiva/sclera: Conjunctivae normal.      Pupils: Pupils are equal, round, and reactive to light.   Cardiovascular:      Rate and Rhythm: Normal rate and regular rhythm.      Heart sounds: Normal heart sounds.   Pulmonary:      Effort: Pulmonary effort is normal. No respiratory distress.      Breath sounds: Normal breath sounds. No wheezing or rales.   Chest:      Chest wall: No tenderness.   Abdominal:      General: Abdomen is flat. Bowel sounds are normal. There is no distension.      Palpations: Abdomen is soft. There is no mass.      Tenderness: There is no abdominal tenderness. There is no right CVA tenderness, left CVA tenderness, guarding or rebound.      Comments: Normal abd exam    Musculoskeletal:         General: Normal range of motion.      Cervical back: Normal range of motion and neck  supple.   Lymphadenopathy:      Cervical: No cervical adenopathy.   Skin:     General: Skin is warm and dry.   Neurological:      Mental Status: She is alert and oriented to person, place, and time. Mental status is at baseline.   Psychiatric:         Mood and Affect: Mood normal.         Behavior: Behavior normal.               Assessment/Plan:                Diarrhea of presumed infectious origin  Having diarrhea on and off will re order c diff.  Patient provided instructions on proper collection and type of specimen to collect.   Patient understands and agrees with plan.                Return if symptoms worsen or fail to improve.         Fortino Sic, MD

## 2021-12-29 ENCOUNTER — Other Ambulatory Visit: Admit: 2021-12-29 | Payer: MEDICARE

## 2021-12-29 DIAGNOSIS — R197 Diarrhea, unspecified: Secondary | ICD-10-CM

## 2021-12-30 NOTE — Unmapped (Signed)
Ok. Last time. If does not work, will need to work with PCP

## 2021-12-30 NOTE — Unmapped (Signed)
C diff check

## 2021-12-30 NOTE — Unmapped (Signed)
Pt is asking for her stool res / turned in yest at 10:30 AM.

## 2021-12-30 NOTE — Unmapped (Signed)
Dr Fidel Levy they cancelled the lab again

## 2021-12-30 NOTE — Unmapped (Signed)
Pt states , Her dtr called the lab, and was told, the stool test failed.    The sample has to be liquid diarrhea, and apparently her stool was soft.     Pt is asking, if she can Pick up another test ( ? )

## 2021-12-30 NOTE — Unmapped (Signed)
Pt informed.

## 2021-12-31 ENCOUNTER — Emergency Department
Admission: EM | Admit: 2021-12-31 | Discharge: 2021-12-31 | Disposition: A | Discharge status: Home / Self Care | Payer: Medicare (Managed Care) | Attending: Emergency Medicine | Admitting: Emergency Medicine

## 2021-12-31 ENCOUNTER — Emergency Department: Payer: Medicare (Managed Care)

## 2021-12-31 ENCOUNTER — Other Ambulatory Visit: Payer: Self-pay

## 2021-12-31 ENCOUNTER — Encounter: Payer: Self-pay | Admitting: *Deleted

## 2021-12-31 DIAGNOSIS — R072 Precordial pain: Secondary | ICD-10-CM | POA: Diagnosis present

## 2021-12-31 DIAGNOSIS — R079 Chest pain, unspecified: Secondary | ICD-10-CM

## 2021-12-31 LAB — CBC
HCT: 39.8 % (ref 36.0–46.0)
Hemoglobin: 13.1 g/dL (ref 12.0–15.0)
MCH: 29.4 pg (ref 26.0–34.0)
MCHC: 32.9 g/dL (ref 30.0–36.0)
MCV: 89.4 fL (ref 80.0–100.0)
Platelets: 176 10*3/uL (ref 150–400)
RBC: 4.45 MIL/uL (ref 3.87–5.11)
RDW: 13.1 % (ref 11.5–15.5)
WBC: 9.1 10*3/uL (ref 4.0–10.5)
nRBC: 0 % (ref 0.0–0.2)

## 2021-12-31 LAB — BASIC METABOLIC PANEL
Anion gap: 9 (ref 5–15)
BUN: 13 mg/dL (ref 8–23)
CO2: 24 mmol/L (ref 22–32)
Calcium: 9.8 mg/dL (ref 8.9–10.3)
Chloride: 102 mmol/L (ref 98–111)
Creatinine, Ser: 0.58 mg/dL (ref 0.44–1.00)
GFR, Estimated: >60 mL/min (ref >60)
Glucose, Bld: 188 mg/dL — ABNORMAL HIGH (ref 70–99)
Potassium: 4.2 mmol/L (ref 3.5–5.1)
Sodium: 135 mmol/L (ref 135–145)

## 2021-12-31 LAB — TROPONIN I (HIGH SENSITIVITY): Troponin I (High Sensitivity): 9 ng/L (ref <18)

## 2021-12-31 NOTE — ED Notes (Signed)
Pt wheeled to waiting room. Pt verbalized understanding of discharge instructions.   

## 2021-12-31 NOTE — ED Triage Notes (Signed)
Patient c/o mid-sternal chest pain that began at 0730 today. Patient describes as a pressure. Patient is on 2L O2 [place] for COPD and asthma.

## 2021-12-31 NOTE — ED Notes (Signed)
Patient states she felt nauseous and had a sweat across her forehead when chest pain first hit, but those have now resolved. Patient was given 324mg   Aspirin at 1000 today, Tylenol 650mg  at 0800 today.

## 2021-12-31 NOTE — ED Provider Notes (Addendum)
[facility] Provider Note   Event Date/Time   First MD Initiated Contact with Patient 12/31/21 1335     (approximate) History  Chest Pain  HPI [name] is a 76 y.o. female with a history of severe GERD who presents for midsternal chest pain that she describes as burning, nonradiating, and associated with diaphoresis and mild nausea occurring at approximately 10 AM today and resolving at approximately noon.  Patient states that this is unlike any reflux symptoms that she has had in the past.  Patient denies any worsening of this pain on exertion.  Patient denies any associated shortness of breath. ROS: Patient currently denies any vision changes, tinnitus, difficulty speaking, facial droop, sore throat, chest pain, shortness of breath, abdominal pain, nausea/vomiting/diarrhea, dysuria, or weakness/numbness/paresthesias in any extremity   Physical Exam  Triage Vital Signs: ED Triage Vitals  Enc Vitals Group     BP 12/31/21 1204 132/72     Pulse Rate 12/31/21 1204 81     Resp 12/31/21 1204 (!) 22     Temp 12/31/21 1204 98.4 F (36.9 C)     Temp Source 12/31/21 1204 Oral     SpO2 12/31/21 1204 97 %     Weight 12/31/21 1205 162 lb (73.5 kg)     Height 12/31/21 1205 5\' 1"  (1.549 m)     Head Circumference --      Peak Flow --      Pain Score 12/31/21 1205 6     Pain Loc --      Pain Edu? --      Excl. in GC? --    Most recent vital signs: Vitals:   12/31/21 1336 12/31/21 1509  BP: 106/69 112/75  Pulse: 72 77  Resp: 17 19  Temp:  98.2 F (36.8 C)  SpO2: 100% 95%   General: Awake, oriented x4. CV:  Good peripheral perfusion.  Resp:  Normal effort.  Abd:  No distention.  Other:  Elderly overweight Caucasian female laying in bed in no acute distress ED Results / Procedures / Treatments  Labs (all labs ordered are listed, but only abnormal results are displayed) Labs Reviewed  BASIC METABOLIC PANEL - Abnormal; Notable for the following  components:      Result Value   Glucose, Bld 188 (*)    All other components within normal limits  CBC  TROPONIN I (HIGH SENSITIVITY)  TROPONIN I (HIGH SENSITIVITY)   EKG ED ECG REPORT I, 01/02/22, the attending physician, personally viewed and interpreted this ECG. Date: 12/31/2021 EKG Time: 1157 Rate: 85 Rhythm: normal sinus rhythm QRS Axis: normal Intervals: normal ST/T Wave abnormalities: normal Narrative Interpretation: no evidence of acute ischemia RADIOLOGY ED MD interpretation: 2 view chest x-ray interpreted by me shows no evidence of acute abnormalities including no pneumonia, pneumothorax, or widened mediastinum -Agree with radiology assessment Official radiology report(s): DG Chest 2 View  Result Date: 12/31/2021 CLINICAL DATA:  Chest pain. EXAM: CHEST - 2 VIEW COMPARISON:  Chest CT 10/05/2021 and chest radiograph 11/27/2020 FINDINGS: Old left rib fractures. Left shoulder arthroplasty. Both lungs are clear. Again noted is a cardiac recorder in the left anterior chest soft tissues. Negative for a pneumothorax. Heart size is normal. Atherosclerotic calcifications at the aortic arch. No large pleural effusions. Degenerative changes in the midthoracic spine. IMPRESSION: No active cardiopulmonary disease. Electronically Signed   By: 11/29/2020 M.D.   On: 12/31/2021 12:57   PROCEDURES: Critical Care performed: No .1-3 Lead EKG  Interpretation  Performed by: Merwyn Katos, MD Authorized by: Merwyn Katos, MD     Interpretation: normal     ECG rate:  78   ECG rate assessment: normal     Rhythm: sinus rhythm     Ectopy: none     Conduction: normal    MEDICATIONS ORDERED IN ED: Medications - No data to display IMPRESSION / MDM / ASSESSMENT AND PLAN / ED COURSE  I reviewed the triage vital signs and the nursing notes.                             The patient is on the cardiac monitor to evaluate for evidence of arrhythmia and/or significant heart rate  changes. Patient's presentation is most consistent with acute presentation with potential threat to life or bodily function. Workup: ECG, CXR, CBC, BMP, Troponin Findings: ECG: No overt evidence of STEMI. No evidence of Brugadas sign, delta wave, epsilon wave, significantly prolonged QTc, or malignant arrhythmia HS Troponin: Negative x1 Other Labs unremarkable for emergent problems. CXR: Without PTX, PNA, or widened mediastinum Last Stress Test: Never Last Heart Catheterization: Never HEART Score: 3  Given History, Exam, and Workup I have low suspicion for ACS, Pneumothorax, Pneumonia, Pulmonary Embolus, Tamponade, Aortic Dissection or other emergent problem as a cause for this presentation.   Reassesment: Prior to discharge patients pain was controlled and they were well appearing.  Disposition:  Discharge. Strict return precautions discussed with patient with full understanding. Advised patient to follow up promptly with primary care provider    FINAL CLINICAL IMPRESSION(S) / ED DIAGNOSES   Final diagnoses:  Nonspecific chest pain   Rx / DC Orders   ED Discharge Orders          Ordered    Ambulatory referral to Cardiology       Comments: If you have not heard from the Cardiology office within the next 72 hours please call [phone].   12/31/21 1457           Note:  This document was prepared using Dragon voice recognition software and may include unintentional dictation errors.   Merwyn Katos, MD 12/31/21 1605    Merwyn Katos, MD 12/31/21 [phone]

## 2021-12-31 NOTE — ED Triage Notes (Signed)
First RN Note: Pt to ED via [facility] with c/o frontal HA then cold chills that resolved on it's own. Per EMS pt then had substernal CP that was non-radiating that resolved then returned. Per EMS pt then had return of CP but is unable to rate pain. Per EMS pt had EKG done with a change to her EKG. Per EMS pt's son is also cardiologist and pt would like him called.    140/70 96% RA

## 2021-12-31 NOTE — Discharge Instructions (Addendum)
Please follow up with cardiology as soon as possible for a follow-up. You should be called for an appointment

## 2022-01-03 ENCOUNTER — Inpatient Hospital Stay: Payer: MEDICARE | Attending: Sports Medicine

## 2022-01-03 ENCOUNTER — Inpatient Hospital Stay: Admit: 2022-01-03 | Discharge: 2022-01-07 | Payer: MEDICARE

## 2022-01-03 ENCOUNTER — Ambulatory Visit: Admit: 2022-01-03 | Payer: MEDICARE

## 2022-01-03 ENCOUNTER — Ambulatory Visit: Admit: 2022-01-03 | Discharge: 2022-01-03 | Payer: MEDICARE

## 2022-01-03 DIAGNOSIS — R197 Diarrhea, unspecified: Secondary | ICD-10-CM

## 2022-01-03 DIAGNOSIS — Z471 Aftercare following joint replacement surgery: Secondary | ICD-10-CM

## 2022-01-03 DIAGNOSIS — M25511 Pain in right shoulder: Secondary | ICD-10-CM

## 2022-01-03 DIAGNOSIS — Z96652 Presence of left artificial knee joint: Secondary | ICD-10-CM

## 2022-01-03 LAB — CLOSTRIDIUM DIFFICILE DNA AMPLIFICATION: Clost. Diff DNA Amp.: NEGATIVE

## 2022-01-03 NOTE — Unmapped (Signed)
Ok

## 2022-01-03 NOTE — Unmapped (Signed)
CC: post op TKA    Subjective:  [name] is a 76 y.o. female here for a postoperative visit following a LEFT total knee arthroplasty on 11/09/2021. Patients pain is tolerable with current medications. They are ambulating w/wo assistive device.  Denies any fever, chills, numbness, tingling, or wound complications.     Objective:  There were no vitals filed for this visit.  General: Well nourished, normally developed no apparent distress    Respiratory: Regular and unlabored  MSK:  Surgical incision appears well healed without signs of infection, extending erythema or drainage.  5/5 EHL/TA/G   Sensation intact to light touch DP/SP/T nerves  Foot WWP with a brisk DP pulse  Knee AROM is 0-90    Imaging:  Stable appearing knee prosthesis with no evidence of complication    Assessment:  [name] is a 76 y.o. female here for a postoperative visit following a LEFT total knee arthroplasty on 11/09/2021    Plan:  Dental prophylaxis was discussed.   Follow-up in approximately 1year with repeat knee xrays  Referral to Dr Kem KaysGrawe for right shoulder evaluation. Multi. RTC tear/GH arthritis

## 2022-01-03 NOTE — Unmapped (Signed)
Patient stopped by stating they dropped off lab and she wanted to let Maralyn SagoSarah know.

## 2022-01-10 ENCOUNTER — Inpatient Hospital Stay: Admit: 2022-01-10 | Payer: MEDICARE | Attending: Sports Medicine

## 2022-01-10 ENCOUNTER — Ambulatory Visit: Admit: 2022-01-10 | Discharge: 2022-01-10 | Payer: MEDICARE | Attending: Sports Medicine

## 2022-01-10 ENCOUNTER — Inpatient Hospital Stay: Admit: 2022-01-10 | Payer: MEDICARE

## 2022-01-10 DIAGNOSIS — M75101 Unspecified rotator cuff tear or rupture of right shoulder, not specified as traumatic: Secondary | ICD-10-CM

## 2022-01-10 DIAGNOSIS — M25511 Pain in right shoulder: Secondary | ICD-10-CM

## 2022-01-10 NOTE — Unmapped (Signed)
[facility]    PATIENT NAME:      [name], [name]                 MRN:                 [id number]  DATE OF BIRTH:     1946-03-23                    CSN:                 [id number]  PROVIDER:          Andree Elk, MD               VISIT DATE:          01/10/2022                                   OFFICE NOTE      [name] is here for her right shoulder.  She is a retired Personal assistant.  She has had right shoulder symptoms for a number of months.  Recently, she has had knee replacement and is now thinking about having shoulder surgery.  She has had a previous MRI   that shows a large rotator cuff tear with significant disease of the tendon without any osteoarthritis.  Her arc range of motion is intact and she is strong with forward flexion and she does not have any arthritis.  I think she would be a great candidate   for a balloon arthroplasty based upon her age, her function and her lack of arthritis.  She also has a clinically and radiographically intact subscapularis and CA ligament.      I will see her back at the time of surgery.  Risks, benefits, expected outcomes were discussed.   will see her back in a couple of weeks if she wants back more questions about the procedure.        Andree Elk, MD      BG/AQ  DD:  01/10/2022 16:43:16  DT:  01/10/2022 21:53:44    JOB#:  [id number]

## 2022-01-10 NOTE — Unmapped (Signed)
This injection is documented for Kipp Laurence, MD.    Per provider's instructions, the patient was prepped for an injection by Kipp Laurence, MD  - as well as drug and  allergies were reviewed.   The appropriate medication was drawn, and documented.  A medical assistant was also present to assist with the injection. The injection was administered by Kipp Laurence, MD. The patient was then educated on what to expect following the injection.   All questions were answered.    A verbal consent was obtained and a verbal timeout was performed on 01/10/2022 at 3:26 PM.     [employer], MA

## 2022-01-11 ENCOUNTER — Ambulatory Visit: Admit: 2022-01-11 | Discharge: 2022-01-11 | Payer: MEDICARE | Attending: "Endocrinology

## 2022-01-11 DIAGNOSIS — E871 Hypo-osmolality and hyponatremia: Secondary | ICD-10-CM

## 2022-01-11 MED ORDER — lidocaine 10 mg/mL (1 %) injection 2 mL
10 | Freq: Once | INTRAMUSCULAR | Status: AC
Start: 2022-01-11 — End: 2022-01-12

## 2022-01-11 MED ORDER — BUPivacaine HCl (MARCAINE) 0.5 % (5 mg/mL) injection 10 mg
0.5 | Freq: Once | INTRAMUSCULAR | Status: AC
Start: 2022-01-11 — End: 2022-01-12

## 2022-01-11 MED ORDER — triamcinolone acetonide (KENALOG-40) injection 40 mg
40 | Freq: Once | INTRAMUSCULAR | Status: AC
Start: 2022-01-11 — End: 2022-01-12

## 2022-01-11 NOTE — Unmapped (Signed)
01/11/2022  Referring Provider: [name], MD   Reason for referral: hyponatremia  HPI:  [name] is a 76 y.o. female with osteoporosis and hyponatremia    Mild hypoglycemia since 09/2021.  Sodium 130-132.  Normal sodium level in 2021  AM cortisol 18. No diabetes. TSH 3.92 in 10/2021    Urine osmolality 440 urine sodium 62 serum sodium 131 and calculated osmolality 273    Had total knee replacement 9 weeks ago.   On oxycodone after the surgery. Had diarrhea   Body weight was ~100 lb prior to the surgery.   Denied swelling in right leg. Had swelling in left leg after the surgery. No edema today.   No tobaco, drinks half glass of wine 6 days per week. No drugs.   Husband passed away 04-17-2020. Oral intake has reduced since then. Cereal for breakfast, half sandwitches with peanut butter for lunch but she does not eat lunches every day. Dinner - eats healthy . Restarted eating more since 2 weeks ago.   She is thinking about getting a colonoscopy for diarrhea  No cough      Allergies   Allergen Reactions    Iodine Anaphylaxis, Hives and Shortness Of Breath     IVP dye for a kidney function test greater than 20 years ago    Iodine And Iodide Containing Products Anaphylaxis     Respiratory Distress  IVP dye for a kidney function test greater than 20 years ago  Respiratory Distress    Iodinated Contrast Media      Shortness of breath, hives.    Shellfish Containing Products      Advised to avoid shellfish due to allergy to iodine.       Current Outpatient Medications   Medication Sig Dispense Refill    alendronate (FOSAMAX) 35 MG tablet Take 1 tablet (35 mg total) by mouth every 7 days.      famotidine (PEPCID) 20 MG tablet Take 1 tablet (20 mg total) by mouth 2 times a day. 60 tablet 0    gabapentin (NEURONTIN) 100 MG capsule Take 2 capsules (200 mg total) by mouth in the morning and at bedtime. 90 capsule 0    naloxone (NARCAN) 4 mg/actuation Spry Apply 1 spray in one nostril if needed. Call 911. May repeat dose in  other nostril if no response in 3 minutes. 2 each 1    senna-docusate (SENNA-S) 8.6-50 mg per tablet Take 1 tablet by mouth 2 times a day as needed for Constipation. 30 tablet 0     Current Facility-Administered Medications   Medication Dose Route Frequency Provider Last Rate Last Admin    bupivacaine (MARCAINE) 0.5 % (5 mg/mL) injection 10 mg  2 mL Subcutaneous Once [employer], PA        BUPivacaine HCl (MARCAINE) 0.5 % (5 mg/mL) injection 10 mg  2 mL Subcutaneous Once Andree Elk, MD        lidocaine 10 mg/mL (1 %) injection 2 mL  2 mL Subcutaneous Once Marissa Lindsey Baum, PA        lidocaine 10 mg/mL (1 %) injection 2 mL  2 mL Subcutaneous Once Andree Elk, MD        triamcinolone acetonide (KENALOG-40) injection 40 mg  40 mg Other Once Andree Elk, MD        triamcinolone acetonide (KENALOG-40) injection 80 mg  80 mg Intra-articular Once Duffy Bruce, [place]             --  History -    Patient Active Problem List   Diagnosis    Thoracic or lumbosacral neuritis or radiculitis, unspecified    Degeneration of lumbar or lumbosacral intervertebral disc    Osteoporosis    Routine health maintenance    Lumbar radiculopathy    Status post left knee replacement    Diarrhea of presumed infectious origin       Past Surgical History:   Procedure Laterality Date    CESAREAN SECTION  1985    HARDWARE REMOVAL  8/09    of left tibia plateau    LEG SURGERY  05/09/06    fractured left tibia plateau    RELEASE CARPAL TUNNEL Left 03/22/2016    Procedure: LEFT CARPAL TUNNEL RELEASE;  Surgeon: Kathrene Bongo, MD;  Location: [facility];  Service: Orthopedics;  Laterality: Left;    TOTAL KNEE ARTHROPLASTY Left 11/09/2021    Procedure: LEFT TOTAL KNEE ARTHROPLASTY;  Surgeon: Skipper Cliche, MD;  Location: [facility];  Service: Orthopedics;  Laterality: Left;    WRIST FRACTURE SURGERY Left 11/13/2015    Procedure: OPEN REDUCTION INTERNAL FIXATION LEFT DISTAL RADIUS FRACTURE;  Surgeon: Kathrene Bongo, MD;  Location: [facility];  Service:  Orthopedics;  Laterality: Left;       Family History   Problem Relation Age of Onset    Stroke Mother     Melanoma Neg Hx        Social History     Socioeconomic History    Marital status: Widowed     Spouse name: Not on file    Number of children: Not on file    Years of education: Not on file    Highest education level: Not on file   Occupational History    Not on file   Tobacco Use    Smoking status: Never    Smokeless tobacco: Never   Vaping Use    Vaping Use: Never used   Substance and Sexual Activity    Alcohol use: Yes     Alcohol/week: 7.0 standard drinks     Types: 7 Glasses of wine per week    Drug use: No     Comment: 12-23-2009    Sexual activity: Yes     Partners: Male   Other Topics Concern    Caffeine Use Not Asked    Occupational Exposure Not Asked    Exercise Not Asked    Seat Belt Not Asked   Social History Narrative    Not on file     Social Determinants of Health     Financial Resource Strain: Not on file   Food Insecurity: Not on file   Physical Activity: Not on file   Stress: Not on file   Social Connections: Not on file   Housing Stability: Not on file       Review of Systems  ROS       No data to display                The following portions of the patient's history were reviewed and updated as appropriate: allergies, current medications, past family history, past medical history, past social history, past surgical history and problem list.    BP 123/71 (BP Location: Right arm, Patient Position: Sitting, BP Cuff Size: Regular)   Pulse 81   Resp 16   Ht 5' 4 (1.626 m)   Wt (!) 92 lb (41.7 kg)   BMI 15.79 kg/m   Wt Readings from Last 3  Encounters:   01/11/22 (!) 92 lb (41.7 kg)   01/10/22 (!) 90 lb (40.8 kg)   01/03/22 (!) 90 lb (40.8 kg)       Physical Exam  Physical Exam  Vitals signs reviewed.   Constitutional:    No acute distress. Normal appearance.    HENT:   Normocephalic and atraumatic.   Eyes:   No discharge.  Extraocular movements intact.   Neck:   Normal range of motion. No  enlarged thyroid glands  Pulmonary:   Pulmonary effort is normal. No respiratory distress.  Neurological:   Alert and oriented   Psychiatric:      Mood normal.  Behavior normal.  Thought content normal.  Judgment normal.   No pedal edema. Skin turgor is reduced.  Labs:  ~Labs in Care Everywhere reviewed~    Lab Results   Component Value Date    WBC 5.8 10/26/2021    HGB 11.0 (L) 11/10/2021    HCT 31.1 (L) 11/10/2021    MCV 97.3 11/10/2021    PLT 198 10/26/2021     Lab Results   Component Value Date    NEUTROABS 4,095 10/26/2021    LYMPHOPCT 20.1 10/26/2021    MONOPCT 7.3 10/26/2021    MONOSABS 423 10/26/2021    EOSPCT 1.5 10/26/2021    EOSABS 87 10/26/2021    BASOPCT 0.5 10/26/2021     Lab Results   Component Value Date    CREATININE 0.66 11/10/2021    BUN 12 11/10/2021    NA 130 (L) 11/10/2021    K 4.3 11/10/2021    CL 99 11/10/2021    CO2 25 11/10/2021     Lab Results   Component Value Date    ALKPHOS 89 09/27/2021    ALT 10 09/27/2021    AST 24 12/30/2009    AST 24 12/30/2009    BILITOT 0.6 09/27/2021    ALBUMIN 4.1 10/26/2021    PROT 6.3 (L) 10/26/2021    LABGLOB 2.5 12/30/2009    LABGLOB 2.5 12/30/2009     Lab Results   Component Value Date    CHOLTOT 208 (H) 09/27/2021    TRIG 98 09/27/2021    HDL 74 09/27/2021    CHOLHDL 3.2 12/30/2009    CHOLHDL 3.2 12/30/2009    CHOLHDL 3.2 12/30/2009    LDL 147114 09/27/2021     Lab Results   Component Value Date    HGBA1C 4.8 10/26/2021    HGBA1C 5.2 09/27/2021    HGBA1C 5.6 06/25/2019     No results found for: FRUCT  Lab Results   Component Value Date    TSH 3.92 10/08/2021     No results found for: MICROALBUR   No results found for: MALBCRT      Assessment and Plan:  [name] is a 76 y.o. female  with osteoporosis and hyponatremia    Mild chronic hypoglycemia, unclear etiology  She is euthyroid clinically. Has diarrhea but trying to stay hydrated. Reviewed previous lab test results. She had mild hypoglycemia since a few months ago. Sodium level ranges from 130 to  132. Urine osmolality was high for the low blood sodium level. Urine sodium was >40. She had normal TSH and AM cortisol in 10/2021.  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 Suggested to increase food/nutrition intake, dietary sodium, stay hydrated, then repeat serum and urine electrolytes, cortisol and free  T4 level.     Follow up: 04/2022  Eldridge Scot, MD

## 2022-01-14 ENCOUNTER — Encounter: Payer: Self-pay | Admitting: Gastroenterology

## 2022-01-16 ENCOUNTER — Encounter: Payer: Self-pay | Admitting: Gastroenterology

## 2022-01-16 NOTE — H&P (Signed)
Pre-Procedure H&P   Patient ID: [name] is a 76 y.o. female.  Gastroenterology Provider: Jaynie Collins, DO  Referring Provider: Fransico Setters, NP PCP: Oneita Hurt, No  Date: 01/17/2022  HPI Ms. [name] is a 76 y.o. female who presents today for Esophagogastroduodenoscopy for Dysphagia, weight loss.  Patient has had worsening dysphagia to solids and liquids over the last year.  She notes some discomfort when swallowing.  Chest x-ray was performed and only demonstrated hiatal hernia.  She has had accompanying weight loss approximately 29 lbs in 3 months.  She underwent a CT chest in May which was overall unremarkable with no findings in the upper abdomen.  She has been cleared by her pulmonologist to undergo endoscopy.  Has noted coughing of pink frothy material at times.  She is status post cholecystectomy and thyroidectomy.  She takes aspirin 325 mg daily which has been held for this procedure  Most recent lab work hemoglobin 13.1 MCV 89 platelets 196,000 creatinine 0.88  Past Medical History:  Diagnosis Date   Asthma    COPD (chronic obstructive pulmonary disease) (HCC)     Past Surgical History:  Procedure Laterality Date   CATARACT EXTRACTION Bilateral    EYE SURGERY     GALLBLADDER SURGERY     HUMERUS SURGERY Left    THYROIDECTOMY     TONSILECTOMY, ADENOIDECTOMY, BILATERAL MYRINGOTOMY AND TUBES     TOTAL SHOULDER REPLACEMENT Left    WRIST SURGERY      Family History No h/o GI disease or malignancy  Review of Systems  Constitutional:  Positive for appetite change and unexpected weight change. Negative for activity change, chills, diaphoresis, fatigue and fever.  HENT:  Negative for trouble swallowing and voice change.   Respiratory:  Positive for cough. Negative for shortness of breath and wheezing.   Cardiovascular:  Negative for chest pain, palpitations and leg swelling.  Gastrointestinal:  Negative for abdominal distention, abdominal pain, anal  bleeding, blood in stool, constipation, diarrhea, nausea, rectal pain and vomiting.  Musculoskeletal:  Negative for arthralgias and myalgias.  Skin:  Negative for color change and pallor.  Neurological:  Negative for dizziness, syncope and weakness.  Psychiatric/Behavioral:  Negative for confusion.   All other systems reviewed and are negative.    Medications No current facility-administered medications on file prior to encounter.   Current Outpatient Medications on File Prior to Encounter  Medication Sig Dispense Refill   enalapril (VASOTEC) 10 MG tablet Take 10 mg by mouth daily.     albuterol (VENTOLIN HFA) 108 (90 Base) MCG/ACT inhaler Inhale 2-4 puffs by mouth every 4 hours as needed for wheezing, cough, and/or shortness of breath 6.7 g 0   predniSONE (DELTASONE) 10 MG tablet Take 4 tabs (40 mg) PO x 3 days, then take 2 tabs (20 mg) PO x 3 days, then take 1 tab (10 mg) PO x 3 days, then take 1/2 tab (5 mg) PO x 4 days. (Patient not taking: Reported on 01/17/2022) 23 tablet 0    Pertinent medications related to GI and procedure were reviewed by me with the patient prior to the procedure   Current Facility-Administered Medications:    0.9 %  sodium chloride infusion, , Intravenous, Continuous, Jaynie Collins, DO, Last Rate: 20 mL/hr at 01/17/22 0934, Continued from Pre-op at 01/17/22 0934      Allergies  Allergen Reactions   Contrast Media [Iodinated Contrast Media] Swelling    Facial swelling   Allergies were reviewed by me prior  to the procedure  Objective   Body mass index is 25.8 kg/m. Vitals:   01/17/22 0906  BP: 121/60  Pulse: 75  Resp: 20  Temp: (!) 97 F (36.1 C)  TempSrc: Temporal  SpO2: 100%  Weight: 68.2 kg  Height: 5\' 4"  (1.626 m)     Physical Exam Vitals and nursing note reviewed.  Constitutional:      General: She is not in acute distress.    Appearance: Normal appearance. She is not ill-appearing, toxic-appearing or diaphoretic.  HENT:      Head: Normocephalic and atraumatic.     Nose: Nose normal.     Mouth/Throat:     Mouth: Mucous membranes are moist.     Pharynx: Oropharynx is clear.  Eyes:     General: No scleral icterus.    Extraocular Movements: Extraocular movements intact.  Cardiovascular:     Rate and Rhythm: Normal rate and regular rhythm.     Heart sounds: Normal heart sounds. No murmur heard.    No friction rub. No gallop.  Pulmonary:     Effort: Pulmonary effort is normal. No respiratory distress.     Breath sounds: Normal breath sounds. No wheezing, rhonchi or rales.  Abdominal:     General: Bowel sounds are normal. There is no distension.     Palpations: Abdomen is soft.     Tenderness: There is no abdominal tenderness. There is no guarding or rebound.  Musculoskeletal:     Cervical back: Neck supple.     Right lower leg: No edema.     Left lower leg: No edema.  Skin:    General: Skin is warm and dry.     Coloration: Skin is not jaundiced or pale.  Neurological:     General: No focal deficit present.     Mental Status: She is alert and oriented to person, place, and time. Mental status is at baseline.  Psychiatric:        Mood and Affect: Mood normal.        Behavior: Behavior normal.        Thought Content: Thought content normal.        Judgment: Judgment normal.      Assessment:  Ms. [name] is a 76 y.o. female  who presents today for Esophagogastroduodenoscopy for Dysphagia, weight loss.  Plan:  Esophagogastroduodenoscopy with possible intervention today  Esophagogastroduodenoscopy with possible biopsy, control of bleeding, polypectomy, and interventions as necessary has been discussed with the patient/patient representative. Informed consent was obtained from the patient/patient representative after explaining the indication, nature, and risks of the procedure including but not limited to death, bleeding, perforation, missed neoplasm/lesions, cardiorespiratory compromise, and  reaction to medications. Opportunity for questions was given and appropriate answers were provided. Patient/patient representative has verbalized understanding is amenable to undergoing the procedure.   61, DO  [facility]  Portions of the record may have been created with voice recognition software. Occasional wrong-word or 'sound-a-like' substitutions may have occurred due to the inherent limitations of voice recognition software.  Read the chart carefully and recognize, using context, where substitutions may have occurred.

## 2022-01-17 ENCOUNTER — Ambulatory Visit: Payer: Medicare (Managed Care) | Admitting: Anesthesiology

## 2022-01-17 ENCOUNTER — Encounter: Payer: Self-pay | Admitting: Gastroenterology

## 2022-01-17 ENCOUNTER — Encounter
Admission: RE | Disposition: A | Discharge status: Home / Self Care | Payer: Self-pay | Source: Home / Self Care | Attending: Gastroenterology

## 2022-01-17 ENCOUNTER — Ambulatory Visit
Admission: RE | Admit: 2022-01-17 | Discharge: 2022-01-17 | Disposition: A | Discharge status: Home / Self Care | Payer: Medicare (Managed Care) | Attending: Gastroenterology | Admitting: Gastroenterology

## 2022-01-17 DIAGNOSIS — R634 Abnormal weight loss: Secondary | ICD-10-CM | POA: Insufficient documentation

## 2022-01-17 DIAGNOSIS — J449 Chronic obstructive pulmonary disease, unspecified: Secondary | ICD-10-CM | POA: Diagnosis not present

## 2022-01-17 DIAGNOSIS — R1310 Dysphagia, unspecified: Secondary | ICD-10-CM | POA: Insufficient documentation

## 2022-01-17 DIAGNOSIS — K449 Diaphragmatic hernia without obstruction or gangrene: Secondary | ICD-10-CM | POA: Diagnosis not present

## 2022-01-17 DIAGNOSIS — K317 Polyp of stomach and duodenum: Secondary | ICD-10-CM | POA: Diagnosis not present

## 2022-01-17 DIAGNOSIS — K2940 Chronic atrophic gastritis without bleeding: Secondary | ICD-10-CM | POA: Diagnosis not present

## 2022-01-17 DIAGNOSIS — Z6825 Body mass index (BMI) 25.0-25.9, adult: Secondary | ICD-10-CM | POA: Insufficient documentation

## 2022-01-17 HISTORY — PX: ESOPHAGOGASTRODUODENOSCOPY (EGD) WITH PROPOFOL: SHX5813

## 2022-01-17 SURGERY — ESOPHAGOGASTRODUODENOSCOPY (EGD) WITH PROPOFOL
Anesthesia: General

## 2022-01-17 MED ORDER — PROPOFOL 1000 MG/100ML IV EMUL
INTRAVENOUS | Status: AC
  Filled 2022-01-17: qty 100

## 2022-01-17 MED ORDER — LIDOCAINE HCL (CARDIAC) PF 100 MG/5ML IV SOSY
PREFILLED_SYRINGE | INTRAVENOUS | Status: DC | PRN
  Administered 2022-01-17: 50 mg via INTRAVENOUS

## 2022-01-17 MED ORDER — LIDOCAINE HCL (PF) 2 % IJ SOLN
INTRAMUSCULAR | Status: AC
  Filled 2022-01-17: qty 5

## 2022-01-17 MED ORDER — PROPOFOL 500 MG/50ML IV EMUL
INTRAVENOUS | Status: DC | PRN
  Administered 2022-01-17: 120 ug/kg/min via INTRAVENOUS

## 2022-01-17 MED ORDER — SODIUM CHLORIDE 0.9 % IV SOLN
INTRAVENOUS | Status: DC
  Administered 2022-01-17: 20 mL/h via INTRAVENOUS

## 2022-01-17 NOTE — Anesthesia Preprocedure Evaluation (Signed)
Anesthesia Evaluation  Patient identified by MRN, date of birth, ID band Patient awake    Reviewed: Allergy & Precautions, NPO status , Patient's Chart, lab work & pertinent test results  Airway Mallampati: II  TM Distance: >3 FB Neck ROM: Full    Dental  (+) Edentulous Upper, Edentulous Lower   Pulmonary neg pulmonary ROS, asthma , COPD,  COPD inhaler,    Pulmonary exam normal  + decreased breath sounds      Cardiovascular Exercise Tolerance: Poor negative cardio ROS Normal cardiovascular exam+ dysrhythmias  Rhythm:Regular     Neuro/Psych negative neurological ROS  negative psych ROS   GI/Hepatic negative GI ROS, Neg liver ROS,   Endo/Other  negative endocrine ROS  Renal/GU negative Renal ROS  negative genitourinary   Musculoskeletal   Abdominal (+) + obese,   Peds negative pediatric ROS (+)  Hematology negative hematology ROS (+)   Anesthesia Other Findings Past Medical History: No date: Asthma No date: COPD (chronic obstructive pulmonary disease) (HCC)  Past Surgical History: No date: CATARACT EXTRACTION; Bilateral No date: EYE SURGERY No date: GALLBLADDER SURGERY No date: HUMERUS SURGERY; Left No date: THYROIDECTOMY No date: TONSILECTOMY, ADENOIDECTOMY, BILATERAL MYRINGOTOMY AND TUBES No date: TOTAL SHOULDER REPLACEMENT; Left No date: WRIST SURGERY  BMI    Body Mass Index: 25.80 kg/m      Reproductive/Obstetrics negative OB ROS                             Anesthesia Physical Anesthesia Plan  ASA: 3  Anesthesia Plan: General   Post-op Pain Management:    Induction: Intravenous  PONV Risk Score and Plan: Propofol infusion and TIVA  Airway Management Planned: Natural Airway  Additional Equipment:   Intra-op Plan:   Post-operative Plan:   Informed Consent: I have reviewed the patients History and Physical, chart, labs and discussed the procedure including the  risks, benefits and alternatives for the proposed anesthesia with the patient or authorized representative who has indicated his/her understanding and acceptance.     Dental Advisory Given  Plan Discussed with: CRNA and Surgeon  Anesthesia Plan Comments:         Anesthesia Quick Evaluation

## 2022-01-17 NOTE — Anesthesia Procedure Notes (Signed)
Date/Time: 01/17/2022 10:08 AM  Performed by: Wayna Chalet, CindyPre-anesthesia Checklist: Patient identified, Emergency Drugs available, Suction available, Patient being monitored and Timeout performed Patient Re-evaluated:Patient Re-evaluated prior to induction Preoxygenation: Pre-oxygenation with 100% oxygen Induction Type: IV induction Airway Equipment and Method: Bite block Placement Confirmation: positive ETCO2 and CO2 detector

## 2022-01-17 NOTE — Transfer of Care (Signed)
Immediate Anesthesia Transfer of Care Note  Patient: [name]  Procedure(s) Performed: ESOPHAGOGASTRODUODENOSCOPY (EGD) WITH PROPOFOL  Patient Location: PACU  Anesthesia Type:General  Level of Consciousness: awake and sedated  Airway & Oxygen Therapy: Patient Spontanous Breathing and Patient connected to nasal cannula oxygen  Post-op Assessment: Report given to RN and Post -op Vital signs reviewed and stable  Post vital signs: Reviewed and stable  Last Vitals:  Vitals Value Taken Time  BP    Temp    Pulse    Resp    SpO2      Last Pain:  Vitals:   01/17/22 0906  TempSrc: Temporal  PainSc: 0-No pain         Complications: No notable events documented.

## 2022-01-17 NOTE — Op Note (Signed)
[facility] Patient Name: [name] Procedure Date: 01/17/2022 9:51 AM MRN: [id number] Account #: [id number] Date of Birth: 1945-07-20 Admit Type: Outpatient Age: 76 Room: [facility] ENDO ROOM 2 Gender: Female Note Status: Finalized Instrument Name: Upper Endoscope [id number] Procedure:             Upper GI endoscopy Indications:           Dysphagia, Weight loss Providers:             Annamaria Helling DO, DO Medicines:             Monitored Anesthesia Care Complications:         No immediate complications. Estimated blood loss:                         Minimal. Procedure:             Pre-Anesthesia Assessment:                        - Prior to the procedure, a History and Physical was                         performed, and patient medications and allergies were                         reviewed. The patient is competent. The risks and                         benefits of the procedure and the sedation options and                         risks were discussed with the patient. All questions                         were answered and informed consent was obtained.                         Patient identification and proposed procedure were                         verified by the physician, the nurse, the anesthetist                         and the technician in the endoscopy suite. Mental                         Status Examination: alert and oriented. Airway                         Examination: normal oropharyngeal airway and neck                         mobility. Respiratory Examination: clear to                         auscultation. CV Examination: RRR, no murmurs, no S3                         or S4. Prophylactic Antibiotics: The patient does  not                         require prophylactic antibiotics. Prior                         Anticoagulants: The patient has taken no previous                         anticoagulant or antiplatelet agents except for                          aspirin. ASA Grade Assessment: III - A patient with                         severe systemic disease. After reviewing the risks and                         benefits, the patient was deemed in satisfactory                         condition to undergo the procedure. The anesthesia                         plan was to use monitored anesthesia care (MAC).                         Immediately prior to administration of medications,                         the patient was re-assessed for adequacy to receive                         sedatives. The heart rate, respiratory rate, oxygen                         saturations, blood pressure, adequacy of pulmonary                         ventilation, and response to care were monitored                         throughout the procedure. The physical status of the                         patient was re-assessed after the procedure.                        After obtaining informed consent, the endoscope was                         passed under direct vision. Throughout the procedure,                         the patient's blood pressure, pulse, and oxygen                         saturations were monitored continuously. The Endoscope  was introduced through the mouth, and advanced to the                         second part of duodenum. The upper GI endoscopy was                         accomplished without difficulty. The patient tolerated                         the procedure well. Findings:      The duodenal bulb, first portion of the duodenum and second portion of       the duodenum were normal. Estimated blood loss: none.      Localized mild inflammation characterized by erosions and erythema was       found in the gastric antrum. Biopsies were taken with a cold forceps for       Helicobacter pylori testing. Estimated blood loss was minimal.      Multiple diminutive sessile polyps with no bleeding and no stigmata  of       recent bleeding were found in the gastric body. The polyp was removed       with a cold biopsy forceps. Resection and retrieval were complete. for       representative view. Estimated blood loss was minimal.      A 3 cm hiatal hernia was present. Estimated blood loss: none.      The Z-line was regular. Estimated blood loss: none.      Esophagogastric landmarks were identified: the gastroesophageal junction       was found at 36 cm from the incisors. Estimated blood loss: none.      Normal mucosa was found in the entire esophagus. Biopsies were taken       with a cold forceps for histology. from mid esophagus. Estimated blood       loss was minimal. A guidewire was placed and the scope was withdrawn.       Dilation was performed with a Savary dilator with no resistance at 51       Fr. The dilation site was examined following endoscope reinsertion and       showed no change. Estimated blood loss: none.      The exam was otherwise without abnormality. Impression:            - Normal duodenal bulb, first portion of the duodenum                         and second portion of the duodenum.                        - Gastritis. Biopsied.                        - Multiple gastric polyps. Resected and retrieved.                        - 3 cm hiatal hernia.                        - Z-line regular.                        - Esophagogastric landmarks identified.                        -  Normal mucosa was found in the entire esophagus.                         Biopsied. Dilated.                        - The examination was otherwise normal. Recommendation:        - Patient has a contact number available for                         emergencies. The signs and symptoms of potential                         delayed complications were discussed with the patient.                         Return to normal activities tomorrow. Written                         discharge instructions were provided to the  patient.                        - Discharge patient to home.                        - Resume previous diet.                        - Continue present medications.                        - Increase nexium/esomeprazole to twice a day.                        - Await pathology results.                        - Return to GI clinic as previously scheduled.                        - Recommend motility/manometry study if continues to                         have issues with trouble swallowing.                        - The findings and recommendations were discussed with                         the patient. Procedure Code(s):     --- Professional ---                        [phone], Esophagogastroduodenoscopy, flexible,                         transoral; with insertion of guide wire followed by                         passage of dilator(s) through esophagus over guide wire  43239, 59, Esophagogastroduodenoscopy, flexible,                         transoral; with biopsy, single or multiple Diagnosis Code(s):     --- Professional ---                        K29.70, Gastritis, unspecified, without bleeding                        K31.7, Polyp of stomach and duodenum                        K44.9, Diaphragmatic hernia without obstruction or                         gangrene                        R13.10, Dysphagia, unspecified                        R63.4, Abnormal weight loss CPT copyright 2019 American Medical Association. All rights reserved. The codes documented in this report are preliminary and upon coder review may  be revised to meet current compliance requirements. Attending Participation:      I personally performed the entire procedure. Volney American, DO Annamaria Helling DO, DO 01/17/2022 10:32:08 AM This report has been signed electronically. Number of Addenda: 0 Note Initiated On: 01/17/2022 9:51 AM Estimated Blood Loss:  Estimated blood loss was minimal.       [facility]

## 2022-01-17 NOTE — Anesthesia Postprocedure Evaluation (Signed)
Anesthesia Post Note  Patient: [name]  Procedure(s) Performed: ESOPHAGOGASTRODUODENOSCOPY (EGD) WITH PROPOFOL  Patient location during evaluation: PACU Anesthesia Type: General Level of consciousness: awake and awake and alert Pain management: pain level controlled Vital Signs Assessment: post-procedure vital signs reviewed and stable Respiratory status: spontaneous breathing and nonlabored ventilation Cardiovascular status: stable Anesthetic complications: no   No notable events documented.   Last Vitals:  Vitals:   01/17/22 1041 01/17/22 1051  BP: (!) 89/53 98/69  Pulse: 80 75  Resp: 18 18  Temp:    SpO2: 96% 97%    Last Pain:  Vitals:   01/17/22 1051  TempSrc:   PainSc: 0-No pain                 VAN STAVEREN,Boss Danielsen

## 2022-01-17 NOTE — Interval H&P Note (Signed)
History and Physical Interval Note: Preprocedure H&P from 01/17/22  was reviewed and there was no interval change after seeing and examining the patient.  Written consent was obtained from the patient after discussion of risks, benefits, and alternatives. Patient has consented to proceed with Esophagogastroduodenoscopy with possible intervention   01/17/2022 10:00 AM  [name]  has presented today for surgery, with the diagnosis of R13.10 - Dysphagia, unspecified type.  The various methods of treatment have been discussed with the patient and family. After consideration of risks, benefits and other options for treatment, the patient has consented to  Procedure(s): ESOPHAGOGASTRODUODENOSCOPY (EGD) WITH PROPOFOL (N/A) as a surgical intervention.  The patient's history has been reviewed, patient examined, no change in status, stable for surgery.  I have reviewed the patient's chart and labs.  Questions were answered to the patient's satisfaction.     Jaynie Collins

## 2022-01-18 ENCOUNTER — Encounter: Payer: Self-pay | Admitting: Gastroenterology

## 2022-01-18 ENCOUNTER — Ambulatory Visit: Admit: 2022-01-18 | Discharge: 2022-01-18 | Payer: MEDICARE

## 2022-01-18 DIAGNOSIS — R197 Diarrhea, unspecified: Secondary | ICD-10-CM

## 2022-01-18 NOTE — Unmapped (Signed)
[facility]  [facility]  [address]  [place] [address]    Name:  [name] Date of Birth: [birth year] (76 y.o.)   MRN: [id number]    Date of Service:  01/18/2022     Subjective:     Chief Complaint   Patient presents with    Post-op Problem     Bowel issues, diarrhea   Tried BRAT diet      History of Present Illness:  [name] is a(n) 76 y.o. female here today for the following:   HPI    76yo female with history of mild hyponatremia / osteoporosis here for urgent visit - diarrhea    -she had TKR 11/09/21, ~10 weeks ago  -she took oxycodone post-op for pain control, which caused constipation. Then she took gabapentin for pain control and at some point afterward but not immediately she started having intermittent diarrhea and fatigue. C-diff test was ordered and finally tested negative after 3rd attempt. She started to feel better and diarrhea had largely resolved but then 4 days ago she had another day of multiple bouts of diarrhea for unclear reasons. She had 2 episodes of loose stool. No blood in the stool. She had had very infrequent bowel movements since then.  -her weight has stayed in the mid-low 90's since the knee surgery  -no fevers / no chills / no night sweats / no abdominal pains    Wt Readings from Last 10 Encounters:   01/18/22 (!) 90 lb 3.2 oz (40.9 kg)   01/11/22 (!) 92 lb (41.7 kg)   01/10/22 (!) 90 lb (40.8 kg)   01/03/22 (!) 90 lb (40.8 kg)   12/28/21 (!) 90 lb (40.8 kg)   11/22/21 (!) 94 lb (42.6 kg)   10/21/21 (!) 96 lb (43.5 kg)   10/26/21 (!) 94 lb 14.4 oz (43 kg)   09/30/21 (!) 96 lb 6.4 oz (43.7 kg)   09/27/21 (!) 96 lb (43.5 kg)               Current Outpatient Medications   Medication Sig Dispense Refill    alendronate (FOSAMAX) 35 MG tablet Take 1 tablet (35 mg total) by mouth every 7 days.      famotidine (PEPCID) 20 MG tablet Take 1 tablet (20 mg total) by mouth 2 times a day. 60 tablet 0    gabapentin (NEURONTIN) 100  MG capsule Take 2 capsules (200 mg total) by mouth in the morning and at bedtime. 90 capsule 0    naloxone (NARCAN) 4 mg/actuation Spry Apply 1 spray in one nostril if needed. Call 911. May repeat dose in other nostril if no response in 3 minutes. 2 each 1    senna-docusate (SENNA-S) 8.6-50 mg per tablet Take 1 tablet by mouth 2 times a day as needed for Constipation. 30 tablet 0     Current Facility-Administered Medications   Medication Dose Route Frequency Provider Last Rate Last Admin    bupivacaine (MARCAINE) 0.5 % (5 mg/mL) injection 10 mg  2 mL Subcutaneous Once [employer], [place]        lidocaine 10 mg/mL (1 %) injection 2 mL  2 mL Subcutaneous Once Marissa Lindsey Baum, [place]        triamcinolone acetonide (KENALOG-40) injection 80 mg  80 mg Intra-articular Once Marissa Cherre Blanc, [place]          Review of Systems  Objective:     Vitals:    01/18/22 0949   BP: 98/60   Pulse: 85   SpO2: 97%   Weight: (!) 90 lb 3.2 oz (40.9 kg)     Body mass index is 15.48 kg/m.        Physical Exam  Normal abdominal exam            Assessment/Plan:   Diarrhea  -slowly improving  -monitor labs  -advised daily probiotic      Number and Complexity of Problems (Level 3)  During this encounter, I addressed 1 stable chronic illness      Amount and/or Complexity of Data Ordered, Reviewed, or Analyzed (Level 2)  I reviewed minimal or no data.      Risk of Complication and/or Morbidity or Mortality of Patient Management (Level  3)  The patient's management entails Low risk of complications and/or morbidity or mortality.    Overall LOS:  3                        Cathi Hazan Jerilynn Mages, MD

## 2022-01-19 LAB — SURGICAL PATHOLOGY

## 2022-01-24 ENCOUNTER — Ambulatory Visit: Admit: 2022-01-24 | Payer: MEDICARE

## 2022-01-24 DIAGNOSIS — E871 Hypo-osmolality and hyponatremia: Secondary | ICD-10-CM

## 2022-01-24 LAB — T4, FREE: Free T4: 0.85 ng/dL (ref 0.61–1.76)

## 2022-01-24 LAB — COMPREHENSIVE METABOLIC PANEL, SERUM
ALT: 9 U/L (ref 7–52)
AST (SGOT): 17 U/L (ref 13–39)
Albumin: 4.3 g/dL (ref 3.5–5.7)
Alkaline Phosphatase: 96 U/L (ref 36–125)
Anion Gap: 6 mmol/L (ref 3–16)
BUN: 13 mg/dL (ref 7–25)
CO2: 30 mmol/L (ref 21–33)
Calcium: 9.7 mg/dL (ref 8.6–10.3)
Chloride: 97 mmol/L (ref 98–110)
Creatinine: 0.7 mg/dL (ref 0.60–1.30)
EGFR: 89
Glucose: 87 mg/dL (ref 70–100)
Osmolality, Calculated: 275 mOsm/kg (ref 278–305)
Potassium: 4.3 mmol/L (ref 3.5–5.3)
Sodium: 133 mmol/L (ref 133–146)
Total Bilirubin: 0.5 mg/dL (ref 0.0–1.5)
Total Protein: 6.5 g/dL (ref 6.4–8.9)

## 2022-01-24 LAB — CORTISOL: Cortisol: 17.1 ug/dL

## 2022-01-24 LAB — TSH: TSH: 2.91 u[IU]/mL (ref 0.45–4.12)

## 2022-01-24 LAB — OSMOLALITY, URINE: Osmolality, Ur: 468 mOsm/kg (ref 50–1200)

## 2022-01-24 LAB — SODIUM, URINE, RANDOM: Sodium, Ur: 95 mmol/L

## 2022-01-31 ENCOUNTER — Ambulatory Visit: Admit: 2022-01-31 | Discharge: 2022-01-31 | Payer: MEDICARE | Attending: Sports Medicine

## 2022-01-31 DIAGNOSIS — M25511 Pain in right shoulder: Secondary | ICD-10-CM

## 2022-01-31 NOTE — Unmapped (Signed)
Pt dropped of history and phys exam form to be fill out and faxed. Not sure when surgery is yet. If you have question please call. Placed in MD's hot file. Thanks

## 2022-01-31 NOTE — Unmapped (Signed)
Placed in MD folder

## 2022-01-31 NOTE — Unmapped (Signed)
[facility]             [facility]    PATIENT NAME:      Lucia EstelleJOHNSON, Davena                 MRN:                 [id number]  DATE OF BIRTH:     02-02-1946                    CSN:                 [id number]  PROVIDER:          Andree ElkBRIAN [place], MD               VISIT DATE:          01/31/2022                                   OFFICE NOTE      HISTORY OF PRESENT ILLNESS:  The patient is here to re-discuss her right shoulder.  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  She has now failed multiple months of physical therapy as well as a steroid shot.  She continues   to have excellent range of motion with significant pain both during the day and at night.  She is interested in a more aggressive measure.    PLAN:  Based upon her age, activity level, symptom duration and exam findings, I think she would be great for hemi balloon arthroplasty.  Why I think that is her subscapularis is intact.  She has a primary complaint of pain and her arc range of motion is   not pseudo paralytic.  I will see her back at the time of surgery.  Risks, benefits, and expected outcomes were discussed.        Andree ElkBRIAN Devanie Galanti, MD      BG/AQ  DD: 01/31/2022 16:31:25  DT: 02/01/2022 03:11:06    JOB#:  [id number]

## 2022-02-01 NOTE — Unmapped (Signed)
Relayed info to pt.  Pt verbalized understanding   Pt will call back and schedule appt when she has a date set for surgery

## 2022-02-01 NOTE — Unmapped (Signed)
Needs surgery date first, then pre-op appt within 30 days.

## 2022-02-09 MED ORDER — lidocaine (PF) 2% (20 mg/mL) Soln 20 mg
20 | Freq: Once | INTRAMUSCULAR | Status: AC | PRN
Start: 2022-02-09 — End: 2022-02-09

## 2022-02-14 NOTE — Unmapped (Signed)
[facility]    We're pleased that you have chosen [facility].  The staff serving you is professionally trained to provide the highest quality care.  We encourage you to ask questions and to let the staff know your special needs.  We want your visit to be as comfortable as possible. Below you will find instructions for your upcoming procedure. Please read the instructions and follow closely, failure to follow instructions may lead to delay or cancellation of your surgery.     Your procedure is scheduled on 02/17/22.    Please arrive at 0830 AM.    Location: [facility]: Check in at the registration desk when you enter the building.       [facility]        [address]  [place], [place] [id number]    Surgery Center phone number:   [phone]       OTHER USEFUL INFORMATION [facility](FYI)     If you have any questions about these instructions please call us at:    [facility]  Monday - Friday 8:00 am - 4:30 pm  [phone]    For questions regarding surgery, please call your surgeon.     GENERAL INFORMATION BEFORE SURGERY     Contact your surgeon if you develop any of the following before surgery:  A fever or symptoms of an Upper Respiratory Infection.  A skin rash/wound/lesion.  A cold or are sick ANYTIME.    If you use tobacco, please quit as far in advance of surgery as possible.   Patients who quit at least 30 days before surgery may have better outcomes.  NICOTINE PATCHES:  should be removed 24 hours in advance of the surgery.        MEDICATIONS     MORNING OF SURGERY    DO NOT EAT OR DRINK ANYTHING (including gum, mints, water, etc) after midnight.    ?TAKE ONLY these medications the morning of surgery with a small sip of water     none       PLEASE BRUSH YOUR TEETH    SHOWER: BOTH the night before and the morning of surgery with an antibacterial soap, such as Dial.    ?Remove  all makeup, dark nail polish, jewelry, wristwatch, body piercings.  ?Do not use powder, lotions, deodorant, and perfume/cologne.   ?Do not shave in the area of the surgery for two (2) days prior to surgery.  If needed, a trained staff member will clip the area immediately before your surgery.    WHAT TO WEAR: Wear casual, loose fitting, and comfortable clothing.    Bring with you, if applicable:  ? List of your medications, dose and how often you take the medication.  ? List of over-the-counter medications and supplements, dose and how often you take the       medication.    ? Photo ID  ? Insurance card     ? Glasses and case  ? Dentures and case   ? Hearing aids and case  ? Crutches, walker, cane (if you have the items and will need them after surgery)        Leave at home:    We recommend that you leave valuables (ex. money, jewelry, credit cards) at home or     with your family.   ? Do not bring any medications to  the hospital. (Exception: rescue inhaler, transplant patients, Parkinson's or seizure medication.)  ? CONTACT LENSES- leave at home or bring a case for safe keeping.       TRANSPORTATION and VISITORS     VISITING HOURS: Same Day Surgery/Pre-Anesthesia: Open to two visitors per patient.      Please make transportation arrangements and bring ONE responsible adult to accompany you home and remain with you for 24 hours after having received anesthesia.              You will NOT be permitted to drive.  You may take a taxi, uber, etc., only if accompanied by a familiar adult.     WHAT TO EXPECT     Make sure all of your health care givers are checking your ID bracelet and verifying your name and date of birth.  You will actively be involved in verifying the type of surgery you are having and the correct site.      Your health care givers should be cleaning their hands with soap and water or antibacterial foam before taking care of you and if they do not, it is ok to remind them to do so.  Antibacterial  showering and good hand hygiene are essential to prevent surgical site infections and reduce the spread of MRSA.     In an effort to reduce the risks of blood clots after your surgery you may have compression sleeves on your lower legs.  These sleeves help facilitate circulation and decrease the chances of developing any blood clots.     You may be given an incentive spirometer after surgery to use every hour to help prevent pneumonia by having you take deep breaths in and out.  You will be given instructions about proper use after surgery.

## 2022-02-15 ENCOUNTER — Ambulatory Visit: Admit: 2022-02-15 | Discharge: 2022-02-15 | Payer: MEDICARE

## 2022-02-15 ENCOUNTER — Ambulatory Visit: Payer: MEDICARE

## 2022-02-15 DIAGNOSIS — Z01818 Encounter for other preprocedural examination: Secondary | ICD-10-CM

## 2022-02-15 NOTE — Unmapped (Signed)
[facility]  [facility] AT [facility] OFFICE  [address]  [place] [address]  Subjective   Name:  [name] Date of Birth: 08/15/1945 (76 y.o.)   MRN: [id number]    Date of Service:  02/15/2022      Chief Complaint   Patient presents with    Pre-op Exam     Patient reports that she is having right shoulder rotator cuff repair. Procedure will be through UC with Dr Kem Kays     History of Present Illness:  [name] is a(n) 76 y.o. female here today for the following:   Pt here for pre op exam     Right shoulder rotator cuff repair with Dr. Kem Kays on Sept 14, 2023         Patient Active Problem List:     Thoracic or lumbosacral neuritis or radiculitis, unspecified     Degeneration of lumbar or lumbosacral intervertebral disc     Osteoporosis     Routine health maintenance     Lumbar radiculopathy     Status post left knee replacement     Diarrhea of presumed infectious origin      Past Medical History:  No date: Acquired absence of ovary, unilateral      Comment:  Missing left ovary.  03/18/2016: Carpal tunnel syndrome on left      Comment:  Added automatically from request for surgery [id number]  12/18/2009: Closed fracture of upper end of tibia  No date: Congenital absence of one kidney      Comment:  No left kidney.  No date: History of fall      Comment:  fell 10/2015  No date: Low back pain  No date: Osteoarthritis  No date: Osteoporosis  No date: Urticaria  No date: Varicella      Past Surgical History:  1985: CESAREAN SECTION  8/09: HARDWARE REMOVAL      Comment:  of left tibia plateau  05/09/06: LEG SURGERY      Comment:  fractured left tibia plateau  03/22/2016: RELEASE CARPAL TUNNEL; Left      Comment:  Procedure: LEFT CARPAL TUNNEL RELEASE;  Surgeon: Kathrene Bongo, MD;  Location: [facility];  Service: Orthopedics;                 Laterality: Left;  11/09/2021: TOTAL KNEE ARTHROPLASTY; Left      Comment:  Procedure: LEFT TOTAL KNEE ARTHROPLASTY;  Surgeon:                 Skipper Cliche, MD;  Location: [facility];  Service:                Orthopedics;  Laterality: Left;  11/13/2015: WRIST FRACTURE SURGERY; Left      Comment:  Procedure: OPEN REDUCTION INTERNAL FIXATION LEFT DISTAL                RADIUS FRACTURE;  Surgeon: Kathrene Bongo, MD;  Location:                [facility];  Service: Orthopedics;  Laterality: Left;    Current Outpatient Medications on File Prior to Visit:  alendronate (FOSAMAX) 35 MG tablet, Take 1 tablet (35 mg total) by mouth every 7 days., Disp: , Rfl:   calcium carbonate (OS-CAL) 500 mg calcium (1,250 mg) chewable tablet, Chew 1 tablet (1,250 mg  total) by mouth daily., Disp: , Rfl:   lactobacillus rhamnosus, GG, (CULTURELLE) 10 billion cell capsule, Take 1 capsule by mouth daily., Disp: , Rfl:     Current Facility-Administered Medications on File Prior to Visit:  bupivacaine (MARCAINE) 0.5 % (5 mg/mL) injection 10 mg, 2 mL, Subcutaneous, Once, [employer], [place]  lidocaine 10 mg/mL (1 %) injection 2 mL, 2 mL, Subcutaneous, Once, Marissa Cherre Blanc, [place]  triamcinolone acetonide (KENALOG-40) injection 80 mg, 80 mg, Intra-articular, Once, Marissa Lindsey Baum, [place]    Allergies:   -- Iodine -- Anaphylaxis, Hives, Shortness Of                            Breath, Other (See Comments) and                            Itching    --  IVP dye for a kidney function test greater than 20             years ago   -- Iodine And Iodide Containing Products -- Anaphylaxis    --  Respiratory Distress             IVP dye for a kidney function test greater than 20             years agoRespiratory Distress   -- Iodinated Contrast Media     --  Shortness of breath, hives.   -- Shellfish Containing Products     --  Advised to avoid shellfish due to allergy to             iodine.    FH:  Not relevant to encounter or surgery        SH:  Tobacco:   None             ETOH: 1/2 glass wine 4-5 times per week                   Rec Drugs:none             Current Outpatient  Medications   Medication Sig Dispense Refill    alendronate (FOSAMAX) 35 MG tablet Take 1 tablet (35 mg total) by mouth every 7 days.      calcium carbonate (OS-CAL) 500 mg calcium (1,250 mg) chewable tablet Chew 1 tablet (1,250 mg total) by mouth daily.      lactobacillus rhamnosus, GG, (CULTURELLE) 10 billion cell capsule Take 1 capsule by mouth daily.       Current Facility-Administered Medications   Medication Dose Route Frequency Provider Last Rate Last Admin    bupivacaine (MARCAINE) 0.5 % (5 mg/mL) injection 10 mg  2 mL Subcutaneous Once [employer], [place]        lidocaine 10 mg/mL (1 %) injection 2 mL  2 mL Subcutaneous Once Marissa Lindsey Baum, [place]        triamcinolone acetonide (KENALOG-40) injection 80 mg  80 mg Intra-articular Once Duffy Bruce, [place]          Review of Systems   Constitutional:  Negative for activity change, appetite change, chills, fatigue and fever.   HENT:  Negative for ear pain, facial swelling, nosebleeds, sneezing and tinnitus.    Eyes:  Negative for pain, discharge and itching.   Respiratory:  Negative for apnea, cough, choking, chest tightness and shortness of breath.    Cardiovascular:  Negative  for chest pain, palpitations and leg swelling.   Gastrointestinal:  Negative for abdominal distention, abdominal pain, constipation, nausea and vomiting.   Genitourinary:  Negative for difficulty urinating, dyspareunia, dysuria and flank pain.   Musculoskeletal:  Negative for arthralgias, back pain, myalgias and neck pain.        See hpi    Skin:  Negative for color change and rash.   Neurological:  Negative for dizziness, seizures, facial asymmetry, light-headedness, numbness and headaches.   Hematological:  Negative for adenopathy. Does not bruise/bleed easily.   Psychiatric/Behavioral:  Negative for agitation and behavioral problems.          Objective   Vitals:    02/15/22 0831   BP: 122/77   Pulse: 71   SpO2: 99%   Height: 5' 4 (1.626 m)   Weight: (!) 93 lb (42.2 kg)    BMI (Calculated): 15.96     Body mass index is 15.96 kg/m.        Physical Exam  Vitals and nursing note reviewed.   Constitutional:       General: She is not in acute distress.     Appearance: Normal appearance. She is well-developed. She is not diaphoretic.   HENT:      Head: Normocephalic and atraumatic.      Right Ear: External ear normal.      Left Ear: External ear normal.      Nose: Nose normal.      Mouth/Throat:      Mouth: Mucous membranes are moist.      Pharynx: Oropharynx is clear.   Eyes:      Extraocular Movements: Extraocular movements intact.      Conjunctiva/sclera: Conjunctivae normal.      Pupils: Pupils are equal, round, and reactive to light.   Neck:      Thyroid: No thyromegaly.      Vascular: No JVD.   Cardiovascular:      Rate and Rhythm: Normal rate and regular rhythm.      Heart sounds: Normal heart sounds. No murmur heard.    No friction rub. No gallop.   Pulmonary:      Effort: Pulmonary effort is normal. No respiratory distress.      Breath sounds: Normal breath sounds. No wheezing or rales.   Abdominal:      General: Abdomen is flat. Bowel sounds are normal. There is no distension.      Palpations: Abdomen is soft.      Tenderness: There is no abdominal tenderness. There is no rebound.   Musculoskeletal:         General: No tenderness. Normal range of motion.      Cervical back: Normal range of motion and neck supple.   Lymphadenopathy:      Cervical: No cervical adenopathy.   Skin:     General: Skin is warm and dry.      Findings: No erythema or rash.   Neurological:      General: No focal deficit present.      Mental Status: She is alert and oriented to person, place, and time.      Cranial Nerves: No cranial nerve deficit.      Motor: No abnormal muscle tone.      Coordination: Coordination normal.      Deep Tendon Reflexes: Reflexes are normal and symmetric. Reflexes normal.   Psychiatric:         Mood and Affect: Mood normal.  Behavior: Behavior normal.         Thought  Content: Thought content normal.                Assessment & Plan              Pre-op exam  Right shoulder rotator cuff repair scheduled for 02-17-22    Pt. Demonstrates no medical contraindications to proposed surgery. This evaluation/assessment has been sent to the referring surgeon and surgeon is made aware today that it is available in electronic format through [facility] EHR.    EKG today.  Prior labs done .        Pre-op exam  Right shoulder rotator cuff repair scheduled for 02-17-22    Pt. Demonstrates no medical contraindications to proposed surgery. This evaluation/assessment has been sent to the referring surgeon and surgeon is made aware today that it is available in electronic format through [facility] EHR.    EKG today.  Prior labs done .      NSR normal axis  no acute ischemic changes.            Return if symptoms worsen or fail to improve.         Fortino Sic, MD

## 2022-02-15 NOTE — Unmapped (Addendum)
Right shoulder rotator cuff repair scheduled for 02-17-22    Pt. Demonstrates no medical contraindications to proposed surgery. This evaluation/assessment has been sent to the referring surgeon and surgeon is made aware today that it is available in electronic format through [facility] EHR.    EKG today.  Prior labs done .      NSR normal axis  no acute ischemic changes.

## 2022-02-17 ENCOUNTER — Other Ambulatory Visit: Payer: Self-pay | Admitting: Family Medicine

## 2022-02-17 DIAGNOSIS — E118 Type 2 diabetes mellitus with unspecified complications: Secondary | ICD-10-CM

## 2022-02-17 MED ORDER — celecoxib (CELEBREX) capsule 400 mg
200 | ORAL | Status: AC | PRN
Start: 2022-02-17 — End: 2022-02-17
  Administered 2022-02-17: 15:00:00 400 mg via ORAL

## 2022-02-17 MED ORDER — propofol 10 mg/ml (DIPRIVAN) injection
10 | INTRAVENOUS | PRN
Start: 2022-02-17 — End: 2022-02-17
  Administered 2022-02-17: 15:00:00 30 via INTRAVENOUS

## 2022-02-17 MED ORDER — gabapentin (NEURONTIN) capsule 600 mg
300 | ORAL | Status: AC | PRN
Start: 2022-02-17 — End: 2022-02-17
  Administered 2022-02-17: 15:00:00 600 mg via ORAL

## 2022-02-17 MED ORDER — labetaloL (NORMODYNE) injection
5 | INTRAVENOUS | PRN
Start: 2022-02-17 — End: 2022-02-17
  Administered 2022-02-17: 16:00:00 5 via INTRAVENOUS

## 2022-02-17 MED ORDER — fentaNYL (SUBLIMAZE) injection 25 mcg
50 | INTRAMUSCULAR | PRN
Start: 2022-02-17 — End: 2022-02-17

## 2022-02-17 MED ORDER — naloxone (NARCAN) injection 0.04 mg
0.4 | INTRAMUSCULAR | PRN
Start: 2022-02-17 — End: 2022-02-17

## 2022-02-17 MED ORDER — acetaminophen (TYLENOL) 500 MG tablet
500 | ORAL_TABLET | Freq: Three times a day (TID) | ORAL | 1 refills | 11.00000 days | Status: AC
Start: 2022-02-17 — End: ?

## 2022-02-17 MED ORDER — dextrose 10%-water (D10W) IV soln
INTRAVENOUS | PRN
Start: 2022-02-17 — End: 2022-02-17

## 2022-02-17 MED ORDER — lidocaine (PF) 20 mg/mL (2 %) Soln
20 | INTRAVENOUS | PRN
Start: 2022-02-17 — End: 2022-02-17
  Administered 2022-02-17: 15:00:00 50 via INTRAVENOUS

## 2022-02-17 MED ORDER — oxyCODONE (ROXICODONE) immediate release tablet 2.5 mg
5 | ORAL | PRN
Start: 2022-02-17 — End: 2022-02-17

## 2022-02-17 MED ORDER — oxyCODONE (ROXICODONE) 5 MG immediate release tablet
5 | ORAL_TABLET | Freq: Four times a day (QID) | ORAL | 0 refills | 6.00000 days | Status: AC | PRN
Start: 2022-02-17 — End: 2022-02-25

## 2022-02-17 MED ORDER — oxyCODONE (ROXICODONE) immediate release tablet 5 mg
5 | ORAL | PRN
Start: 2022-02-17 — End: 2022-02-17

## 2022-02-17 MED ORDER — ceFAZolin (ANCEF) IVPB 2 g in D5W (duplex)
2 | INTRAVENOUS | Status: AC | PRN
Start: 2022-02-17 — End: 2022-02-17
  Administered 2022-02-17: 15:00:00 2 g via INTRAVENOUS

## 2022-02-17 MED ORDER — HYDROmorphone (DILAUDID) injection Syrg 0.5 mg
0.5 | INTRAMUSCULAR | PRN
Start: 2022-02-17 — End: 2022-02-17

## 2022-02-17 MED ORDER — lactated Ringers infusion
INTRAVENOUS
Start: 2022-02-17 — End: 2022-02-17
  Administered 2022-02-17: 15:00:00 20 mL/h via INTRAVENOUS

## 2022-02-17 MED ORDER — fentaNYL (SUBLIMAZE) injection 50 mcg
50 | INTRAMUSCULAR | PRN
Start: 2022-02-17 — End: 2022-02-17

## 2022-02-17 MED ORDER — proMETHazine (PHENERGAN) injection 6.25 mg
25 | Freq: Four times a day (QID) | INTRAMUSCULAR | PRN
Start: 2022-02-17 — End: 2022-02-17

## 2022-02-17 MED ORDER — glucose chewable tablet 12 g
4 | ORAL | PRN
Start: 2022-02-17 — End: 2022-02-17

## 2022-02-17 MED ORDER — midazolam (PF) (VERSED) injection 2 mg
1 | Freq: Once | INTRAMUSCULAR | Status: AC
Start: 2022-02-17 — End: 2022-02-17
  Administered 2022-02-17: 15:00:00 1 mg via INTRAVENOUS

## 2022-02-17 MED ORDER — dexamethasone (DECADRON) injection 4 mg
4 | Freq: Once | INTRAMUSCULAR | PRN
Start: 2022-02-17 — End: 2022-02-17

## 2022-02-17 MED ORDER — sodium chloride 0.9 % irrigation
0.9 | PRN
Start: 2022-02-17 — End: 2022-02-17
  Administered 2022-02-17: 14:00:00 6000

## 2022-02-17 MED ORDER — acetaminophen (TYLENOL) tablet 975 mg
325 | ORAL | Status: AC | PRN
Start: 2022-02-17 — End: 2022-02-17
  Administered 2022-02-17: 15:00:00 975 mg via ORAL

## 2022-02-17 MED ORDER — propofol (DIPRIVAN) infusion 10 mg/mL
10 | INTRAVENOUS | PRN
Start: 2022-02-17 — End: 2022-02-17
  Administered 2022-02-17: 15:00:00 100 via INTRAVENOUS

## 2022-02-17 MED ORDER — HYDROmorphone (DILAUDID) injection 1 mg
2 | INTRAMUSCULAR | PRN
Start: 2022-02-17 — End: 2022-02-17

## 2022-02-17 MED ORDER — ropivacaine (PF) (NAROPIN) injection solution 0.5%
5 | Freq: Once | INTRAMUSCULAR | Status: AC
Start: 2022-02-17 — End: 2022-02-17
  Administered 2022-02-17: 15:00:00 150 mL via PERINEURAL

## 2022-02-17 MED ORDER — ondansetron (ZOFRAN) injection 4 mg
4 | Freq: Three times a day (TID) | INTRAMUSCULAR | PRN
Start: 2022-02-17 — End: 2022-02-17

## 2022-02-17 MED ORDER — senna-docusate (SENNOSIDES-DOCUSATE SODIUM) 8.6-50 mg per tablet
8.6-50 | ORAL_TABLET | Freq: Two times a day (BID) | ORAL | 0 refills | Status: AC | PRN
Start: 2022-02-17 — End: ?

## 2022-02-17 MED ORDER — ibuprofen (MOTRIN) 600 MG tablet
600 | ORAL_TABLET | Freq: Three times a day (TID) | ORAL | 1 refills | Status: AC
Start: 2022-02-17 — End: ?

## 2022-02-17 MED FILL — NAROPIN (PF) 5 MG/ML (0.5 %) INJECTION SOLUTION: 5 5 mg/mL (0.5 %) | INTRAMUSCULAR | Qty: 30

## 2022-02-17 MED FILL — LACTATED RINGERS INTRAVENOUS SOLUTION: 20.00 20.00 mL/hr | INTRAVENOUS | Qty: 1000

## 2022-02-17 MED FILL — GABAPENTIN 300 MG CAPSULE: 300 300 MG | ORAL | Qty: 2

## 2022-02-17 MED FILL — TYLENOL 325 MG TABLET: 325 325 mg | ORAL | Qty: 3

## 2022-02-17 MED FILL — CELEBREX 200 MG CAPSULE: 200 200 mg | ORAL | Qty: 2

## 2022-02-17 MED FILL — CEFAZOLIN 2 GRAM/50 ML IN DEXTROSE (ISO-OSMOTIC) INTRAVENOUS PIGGYBACK: 2 2 gram/50 mL | INTRAVENOUS | Qty: 50

## 2022-02-17 MED FILL — MIDAZOLAM (PF) 1 MG/ML INJECTION SOLUTION: 1 1 mg/mL | INTRAMUSCULAR | Qty: 2

## 2022-02-17 NOTE — Unmapped (Signed)
Arthor Gorter, M.D.  [email or web address]  [address]  ,  [address]  [phone]        IMMEDIATE POST-OPERATIVE INSTRUCTIONS - SHOULDER SURGERY     FOLLOW-UP    Please call the office to schedule a follow-up appointment for your suture removal, 10-14 days post-operatively.  At your first post-operative visit, Dr. Deliah Strehlow OR Blair McChesney, CNP will go over your surgery and outline your rehabilitation.    WOUND CARE    You may remove the Operative Dressing on Post-Op Day #2-3.  KEEP THE INCISIONS CLEAN AND DRY.  Apply Band-Aids to the wounds. Change the Band-Aids daily. Please do not use Bacitracin or other ointments under the bandage.   Use the Cryocuff or Ice as often as possible for the first 3-4 days, then as needed for pain relief.   You may shower on Post-Op Day #2. Please cover the wound with plastic wrap and secure it to your skin with tape. You may remove the sling for showering, but keep the arm across the chest. Gently pat the area dry. Do not soak the shoulder in water. Do not go swimming in the pool or ocean until your sutures are removed.    POST-OP        EXERCISES    Wear the sling at all times. You may remove the sling for showering, but keep the arm across the chest.       IF YOU HAVE ANY QUESTIONS, PLEASE FEEL FREE TO CALL OUR OFFICE.  Christa Penny @ [phone].  If your problem is an emergency you must go to the hospital or call 911.

## 2022-02-17 NOTE — Unmapped (Signed)
[place]                        [facility]      PATIENT NAME:      [name], [name]                 MRN:               [id number]  DATE OF BIRTH:     Nov 17, 1945                    CSN:               [id number]  PHYSICIAN:         Andree Elk, MD               ADMIT DATE:        02/17/2022  DICTATED BY:       Andree Elk, MD               SURGERY DATE:      02/17/2022                              OPERATIVE REPORT    SURGEON:  Andree Elk, MD      PREOPERATIVE DIAGNOSIS:  Right shoulder massive irreparable rotator cuff tear.    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    PROCEDURE PERFORMED:  Right shoulder subacromial balloon hemiarthroplasty.    ASSISTANTS:  Johnell Comings and Carlynn Purl.    ANESTHESIA:  Regional.    COMPLICATIONS:  None.    DETAILS OF PROCEDURE:  The patient was met in the preoperative holding area.  The site was marked.  The consent was reviewed.  He was brought back to the operating room and placed on the table in a beach-chair position.  The right upper extremity was   prepped and draped in the normal sterile fashion using ChloraPrep.  A time-out performed to identify the correct patient and the fact that antibiotics were given.  I began the procedure by undergoing a diagnostic arthroscopy through a posterior portal.    Complete inventory of the joint was undertaken.       Grade 1 chondromalacia changes were noted throughout, which then allowed for me to turn my attention to the subacromial space.  I made a lateral working portal and did a small debridement of fibrotic tissue so that I could see throughout the subacromial   space and then measured from the greater tuberosity with a probe all the way to the glenoid rim about 35 mm in length .  A small balloon was chosen.  The device was entered in through a lateral portal.  The balloon was then deployed and then filled   with saline.  After it was filled with saline and then the  balloon was taken out as per protocol.       We were able to patch the holder and then assess for the balloon to remain in the subacromial space throughout a range of motion.  We then took the camera out, closed it up with nylon, and took her to PACU in a stable condition.        Andree Elk, MD      BG/AQ  DD:  02/17/2022 15:20:07  DT:  02/17/2022 23:37:52    JOB#:  [id number]

## 2022-02-17 NOTE — Unmapped (Signed)
[place]  DEPARTMENT OF ANESTHESIOLOGY  PRE-PROCEDURAL EVALUATION    [name] is a 76 y.o. year old female presenting for:    Procedure(s):  Right Shoulder Arthroscopic Balloon Arthroplasty    Surgeon:   Andree Elk, MD    Chief Complaint     Tear of right infraspinatus tendon, initial encounter [W41.324M]; Tear of *    Review of Systems     Anesthesia Evaluation    Patient summary reviewed and nursing notes reviewed.  All other systems reviewed and are negative.           Cardiovascular:    Exercise tolerance: good      Neuro/Muscoloskeletal/Psych:    (+) neuromuscular disease and back problem.         Pulmonary:        GI/Hepatic/Renal:          Chronic renal disease.    Endo/Other:          Past Medical History     Past Medical History:   Diagnosis Date    Acquired absence of ovary, unilateral     Missing left ovary.    Carpal tunnel syndrome on left 03/18/2016    Added automatically from request for surgery [id number]    Closed fracture of upper end of tibia 12/18/2009    Congenital absence of one kidney     No left kidney.    History of fall     fell 10/2015    Low back pain     Osteoarthritis     Osteoporosis     Urticaria     Varicella        Past Surgical History     Past Surgical History:   Procedure Laterality Date    CESAREAN SECTION  1985    HARDWARE REMOVAL  8/09    of left tibia plateau    LEG SURGERY  05/09/06    fractured left tibia plateau    RELEASE CARPAL TUNNEL Left 03/22/2016    Procedure: LEFT CARPAL TUNNEL RELEASE;  Surgeon: Kathrene Bongo, MD;  Location: [facility];  Service: Orthopedics;  Laterality: Left;    TOTAL KNEE ARTHROPLASTY Left 11/09/2021    Procedure: LEFT TOTAL KNEE ARTHROPLASTY;  Surgeon: Skipper Cliche, MD;  Location: [facility];  Service: Orthopedics;  Laterality: Left;    WRIST FRACTURE SURGERY Left 11/13/2015    Procedure: OPEN REDUCTION INTERNAL FIXATION LEFT DISTAL RADIUS FRACTURE;  Surgeon: Kathrene Bongo, MD;  Location: [facility];  Service: Orthopedics;  Laterality:  Left;       Family History     Family History   Problem Relation Age of Onset    Stroke Mother     Melanoma Neg Hx        Social History     Social History     Socioeconomic History    Marital status: Widowed     Spouse name: Not on file    Number of children: Not on file    Years of education: Not on file    Highest education level: Not on file   Occupational History    Not on file   Tobacco Use    Smoking status: Never    Smokeless tobacco: Never   Vaping Use    Vaping Use: Never used   Substance and Sexual Activity    Alcohol use: Yes     Alcohol/week: 4.0 standard drinks     Types: 4 Glasses of wine per week  Drug use: No     Comment: 12-23-2009    Sexual activity: Not Currently     Partners: Male   Other Topics Concern    Caffeine Use Not Asked    Occupational Exposure Not Asked    Exercise Not Asked    Seat Belt Not Asked   Social History Narrative    Not on file     Social Determinants of Health     Financial Resource Strain: Not on file   Physical Activity: Not on file   Stress: Not on file   Social Connections: Not on file   Housing Stability: Not on file       Medications     Allergies:  Allergies   Allergen Reactions    Iodine Anaphylaxis, Hives, Shortness Of Breath, Other (See Comments) and Itching     IVP dye for a kidney function test greater than 20 years ago    Iodine And Iodide Containing Products Anaphylaxis     Respiratory Distress  IVP dye for a kidney function test greater than 20 years ago  Respiratory Distress    Iodinated Contrast Media      Shortness of breath, hives.    Shellfish Containing Products      Advised to avoid shellfish due to allergy to iodine.       Home Meds:  Prior to Admission medications as of 02/17/22 1002   Medication Sig Taking?   alendronate (FOSAMAX) 35 MG tablet Take 1 tablet (35 mg total) by mouth every 7 days. Yes   calcium carbonate (OS-CAL) 500 mg calcium (1,250 mg) chewable tablet Chew 1 tablet (1,250 mg total) by mouth daily. Yes    lactobacillus rhamnosus, GG, (CULTURELLE) 10 billion cell capsule Take 1 capsule by mouth daily. Yes       Inpatient Meds:  Scheduled:    midazolam  2 mg Intravenous Once    ropivacaine (PF) 0.5%  30 mL PERINEURAL Once     Continuous:    lactated Ringers         PRN: acetaminophen, ceFAZolin (ANCEF) IVPB, celecoxib, gabapentin    Vital Signs     Wt Readings from Last 3 Encounters:   02/15/22 (!) 93 lb (42.2 kg)   01/31/22 (!) 90 lb (40.8 kg)   01/18/22 (!) 90 lb 3.2 oz (40.9 kg)     Ht Readings from Last 3 Encounters:   02/15/22 5' 4 (1.626 m)   01/31/22 5' 4 (1.626 m)   01/11/22 5' 4 (1.626 m)     Temp Readings from Last 3 Encounters:   12/28/21 98.4 F (36.9 C) (Temporal)   11/11/21 98.2 F (36.8 C) (Oral)   09/27/21 97.2 F (36.2 C)     BP Readings from Last 3 Encounters:   02/15/22 122/77   01/18/22 98/60   01/11/22 123/71     Pulse Readings from Last 3 Encounters:   02/15/22 71   01/18/22 85   01/11/22 81     @LASTSAO2 (3)@    Physical Exam     Airway:     Mallampati: II  Mouth Opening: >2 FB  TM distance: > = 3 FB  Neck ROM: full    Dental:   - No obvious cracked, loose, chipped, or missing teeth.     Pulmonary:   - normal exam     Cardiovascular:  - normal exam     Neuro/Musculoskeletal/Psych:  - normal neurological exam.         Abdominal:  Current OB Status:       Other Findings:      Laboratory Data     Lab Results   Component Value Date    WBC 5.8 10/26/2021    HGB 11.0 (L) 11/10/2021    HCT 31.1 (L) 11/10/2021    MCV 97.3 11/10/2021    PLT 198 10/26/2021       No results found for: [facility]    Lab Results   Component Value Date    GLUCOSE 87 01/24/2022    BUN 13 01/24/2022    CO2 30 01/24/2022    CREATININE 0.70 01/24/2022    K 4.3 01/24/2022    NA 133 01/24/2022    CL 97 (L) 01/24/2022    CALCIUM 9.7 01/24/2022    ALBUMIN 4.3 01/24/2022    PROT 6.5 01/24/2022    ALKPHOS 96 01/24/2022    ALT 9 01/24/2022    AST 24 12/30/2009    AST 24 12/30/2009    BILITOT 0.5 01/24/2022       Lab Results    Component Value Date    INR 1.1 10/26/2021       No results found for: PREGTESTUR, PREGSERUM, HCG, HCGQUANT    Anesthesia Plan     ASA 2         Female and current non-smoker    Anesthesia Type:  regional and Pre-op pain block for post-op pain control.      PONV Risk Factors: female, current non-smoker              Induction:   Intravenous induction.      Anesthetic plan and risks discussed with patient.    Plan, alternatives, and risks of anesthesia, including death, have been explained to and discussed with the patient/legal guardian.  By my assessment, the patient/legal guardian understands and agrees.  Scenario presented in detail.  Questions answered.          Plan discussed with CRNA.

## 2022-02-17 NOTE — Unmapped (Addendum)
Anesthesia Transfer of Care Note    Patient: [name]  Procedure(s) Performed: Procedure(s):  Right Shoulder Arthroscopic Balloon Arthroplasty    Patient location: PACU    Anesthesia type: regional    Airway Device on Arrival to PACU/ICU: Room Air    IV Access: Peripheral    Monitors Recommended to be Used During PACU/ICU: Standard Monitors    Outstanding Issues to Address: None    Level of Consciousness: awake and alert     Post vital signs:    Vitals:    02/17/22 1200   BP: (!) 162/100   Pulse: 84   Resp: 19   Temp:    SpO2: 95%       Complications:  No notable events documented.    Date 02/16/22 0700 - 02/17/22 0659(Not Admitted) 02/17/22 0700 - 02/18/22 0659   Shift 0700-1459 1500-2259 2300-0659 24 Hour Total 0700-1459 1500-2259 2300-0659 24 Hour Total   INTAKE   I.V.     500   500     Volume (mL) (lactated Ringers infusion)     500   500   Shift Total     500   500   OUTPUT   Shift Total           Weight (kg)

## 2022-02-17 NOTE — Unmapped (Incomplete)
Discharged home in stable condition.  VSS.  Discharge instructions given, pt and family verbalizes understanding. Drsg D/I.  Questions/concerns addressed.  To exit via w/c, in no acute distress.

## 2022-02-17 NOTE — Unmapped (Signed)
Right Shoulder Arthroscopic Balloon Arthroplasty  Brief Op Note  [name]  02/17/2022      Pre-op Diagnosis: Tear of right infraspinatus tendon, initial encounter [S46.811A]  Tear of right supraspinatus tendon [M75.101]       Post-op Diagnosis: as above     Procedure(s):  Right Shoulder Arthroscopic Balloon Arthroplasty      Surgeon(s):  Andree ElkBrian Grawe, MD    Anesthesia: Regional    Staff:   Circulator: Rae RoamSarah Kirker, RN  Scrub Person: Leonides CaveKristen M Conrad  Assistant: Marchelle Gearingarashi Williams  Resident: Vita ErmAlthea Anne Juanetta Negash, MD; Marijo ConceptionElizabeth Marie Wacker, MD    Estimated Blood Loss: Minimal    There were no complications unless listed below.     Vita ErmALTHEA ANNE Justinian Miano     Date: 02/17/2022  Time: 11:36 AM

## 2022-02-17 NOTE — Unmapped (Signed)
Anesthesia Post Note    Patient: [name]    Procedure(s) Performed: Procedure(s):  Right Shoulder Arthroscopic Balloon Arthroplasty    Anesthesia type: regional    Patient location: PACU    Airway: Patent    Post pain: Adequate analgesia    Nausea / Vomiting: Absent    Post-operative Hydration Status: Adequate    Post assessment: no apparent anesthetic complications    Last Vitals:   Vitals:    02/17/22 1200 02/17/22 1205 02/17/22 1210 02/17/22 1215   BP: (!) 162/100 (!) 161/103 (!) 145/91 (!) 149/91   BP Location:       Patient Position:       BP Cuff Size:       Pulse: 84 81 75 74   Resp: 19 14 18 14    Temp:       TempSrc:       SpO2: 95% 95% 95% 96%        Post vital signs: stable    Level of consciousness: awake    Complications:  There were no known notable events for this encounter.

## 2022-02-17 NOTE — Unmapped (Signed)
INTRA-OP POST BRIEFING NOTE: [name]      Specimens:     Prior to leaving the room: Nurse confirmed name of procedure, completion of instrument, sponge & needle counts, reads specimen labels aloud including patient name and addresses any equipment issues? Nurse confirmed wound class. Nurse to surgeon and anesthesia: What are key concerns for recovery and management of the patient?  Yes      Blood products stored at appropriate temperatures prior to return to blood bank (if applicable)? N/A      Patient identification band secured on patient prior to transfer out of the operating room? Yes    Temporary devices implanted for the duration of the surgery removed and evaluated for intactness and completeness prior to closure? Yes      Other Comments:     Signed: Rae RoamSARAH Shailee Foots    Date: 02/17/2022    Time: 11:38 AM

## 2022-02-17 NOTE — Unmapped (Signed)
Regional Block    Block Type -Upper Extremity: brachial plexus, interscalene         Laterality:  right    Medications Administered: Pre Sedation / Block Medications    Medication administered at:  02/17/2022 10:50 AM         Preanesthetic Checklist  Completed:  patient identified, IV checked, risks and benefits discussed, pre-op evaluation, timeout performed, anesthesia consent, hand hygiene performed, patient being monitored, patient agrees, verbalizes understanding, and wants to proceed and questions answered / anesthesia plan accepted      Block Reason: primary anesthetic     Diagnosis:  other Other:  shoulder injury  Patient Location during procedure:  pre-op holding (SDS)   Block Timing:  preoperatively  Timeout verification:  correct patient, correct procedure, correct site, allergies reviewed and anticoagulant precautions reviewed    Timeout performed at:  10:50 EDT  Timeout performed by:  RN    PROCEDURE:  Block Start Time:  02/17/2022 10:50 AM    Patient Position:  sitting  Prep: Chloraprep    Needle: Block needle 21 G 2 in         Injection Technique:  single shot             Continuous ultrasound guidance was utilized for the procedure. Tissue planes, vascular and nerve structures were identified. Relevant anatomy for the intention of the block was noted. Ultrasound image was interpreted, captured, and stored. Nerve Stimulator          Complications:  blood not aspirated, no injection resistance, no paresthesia and no other event       See EMR for periprocedural vitals signs. Stable thoroughout unless otherwise documented      Block End Time:  02/17/2022 10:55 AM    Notes:   Versed 1 mg IV given for sedation.  Ropivicaine 0.5% 20 ml administered for nerve block.     Staffing:  Anesthesiologist:  Stacie Acres, MD  Resident/Fellow: Jeannine Kitten, MD      Performed by:  Resident/Fellow

## 2022-02-21 ENCOUNTER — Other Ambulatory Visit: Payer: Self-pay | Admitting: Orthopedic Surgery

## 2022-02-25 ENCOUNTER — Other Ambulatory Visit: Payer: Medicare (Managed Care)

## 2022-02-25 ENCOUNTER — Ambulatory Visit
Admission: RE | Admit: 2022-02-25 | Discharge: 2022-02-25 | Disposition: A | Discharge status: Home / Self Care | Payer: Medicare (Managed Care) | Source: Ambulatory Visit | Attending: Family Medicine | Admitting: Family Medicine

## 2022-02-25 DIAGNOSIS — E118 Type 2 diabetes mellitus with unspecified complications: Secondary | ICD-10-CM | POA: Insufficient documentation

## 2022-02-28 ENCOUNTER — Ambulatory Visit: Admit: 2022-02-28 | Discharge: 2022-02-28 | Payer: MEDICARE

## 2022-02-28 DIAGNOSIS — S46811A Strain of other muscles, fascia and tendons at shoulder and upper arm level, right arm, initial encounter: Secondary | ICD-10-CM

## 2022-02-28 NOTE — Unmapped (Signed)
[name] here for there first post op visit from a subacromial balloon on her right shoulder. She states she is doing okay. Still having a hard time sleeping. She has been in the sling.      On physical exam right upper extremity shows well-healing arthroscopic portal incisions without any evidence of infection including erythema, induration or palpable fluctuance. There is full active flexion and extension of her right elbow.  AIN, PIN and interosseous nerves are intact.  His sensation is intact to light touch in the median, radial, ulnar and axillary nerve distributions.      No new images this office visit.     Plan we will get her into PT. She can d/c the sling. Follow-up in 4 weeks.

## 2022-03-02 ENCOUNTER — Encounter
Admission: RE | Admit: 2022-03-02 | Discharge: 2022-03-02 | Disposition: A | Discharge status: Home / Self Care | Payer: Medicare (Managed Care) | Source: Ambulatory Visit | Attending: Orthopedic Surgery | Admitting: Orthopedic Surgery

## 2022-03-02 DIAGNOSIS — Z01812 Encounter for preprocedural laboratory examination: Secondary | ICD-10-CM

## 2022-03-02 DIAGNOSIS — Z01818 Encounter for other preprocedural examination: Secondary | ICD-10-CM | POA: Diagnosis present

## 2022-03-02 DIAGNOSIS — M25561 Pain in right knee: Secondary | ICD-10-CM | POA: Insufficient documentation

## 2022-03-02 HISTORY — DX: Anemia, unspecified: D64.9

## 2022-03-02 HISTORY — DX: Gastro-esophageal reflux disease without esophagitis: K21.9

## 2022-03-02 HISTORY — DX: Essential (primary) hypertension: I10

## 2022-03-02 HISTORY — DX: Personal history of other diseases of the digestive system: Z87.19

## 2022-03-02 HISTORY — DX: Type 2 diabetes mellitus without complications: E11.9

## 2022-03-02 HISTORY — DX: Hypothyroidism, unspecified: E03.9

## 2022-03-02 LAB — CBC WITH DIFFERENTIAL/PLATELET
Abs Immature Granulocytes: 0.04 10*3/uL (ref 0.00–0.07)
Basophils Absolute: 0.1 10*3/uL (ref 0.0–0.1)
Basophils Relative: 1 %
Eosinophils Absolute: 0.2 10*3/uL (ref 0.0–0.5)
Eosinophils Relative: 2 %
HCT: 42.7 % (ref 36.0–46.0)
Hemoglobin: 13.7 g/dL (ref 12.0–15.0)
Immature Granulocytes: 0 %
Lymphocytes Relative: 27 %
Lymphs Abs: 2.4 10*3/uL (ref 0.7–4.0)
MCH: 29.9 pg (ref 26.0–34.0)
MCHC: 32.1 g/dL (ref 30.0–36.0)
MCV: 93.2 fL (ref 80.0–100.0)
Monocytes Absolute: 0.6 10*3/uL (ref 0.1–1.0)
Monocytes Relative: 7 %
Neutro Abs: 5.6 10*3/uL (ref 1.7–7.7)
Neutrophils Relative %: 63 %
Platelets: 187 10*3/uL (ref 150–400)
RBC: 4.58 MIL/uL (ref 3.87–5.11)
RDW: 13.2 % (ref 11.5–15.5)
WBC: 8.9 10*3/uL (ref 4.0–10.5)
nRBC: 0 % (ref 0.0–0.2)

## 2022-03-02 LAB — COMPREHENSIVE METABOLIC PANEL
ALT: 31 U/L (ref 0–44)
AST: 31 U/L (ref 15–41)
Albumin: 4.4 g/dL (ref 3.5–5.0)
Alkaline Phosphatase: 47 U/L (ref 38–126)
Anion gap: 12 (ref 5–15)
BUN: 17 mg/dL (ref 8–23)
CO2: 27 mmol/L (ref 22–32)
Calcium: 9.6 mg/dL (ref 8.9–10.3)
Chloride: 99 mmol/L (ref 98–111)
Creatinine, Ser: 0.65 mg/dL (ref 0.44–1.00)
GFR, Estimated: >60 mL/min (ref >60)
Glucose, Bld: 191 mg/dL — ABNORMAL HIGH (ref 70–99)
Potassium: 4.1 mmol/L (ref 3.5–5.1)
Sodium: 138 mmol/L (ref 135–145)
Total Bilirubin: 0.8 mg/dL (ref 0.3–1.2)
Total Protein: 8.8 g/dL — ABNORMAL HIGH (ref 6.5–8.1)

## 2022-03-02 LAB — URINALYSIS, ROUTINE W REFLEX MICROSCOPIC
Bacteria, UA: NONE SEEN
Bilirubin Urine: NEGATIVE
Glucose, UA: NEGATIVE mg/dL
Ketones, ur: NEGATIVE mg/dL
Leukocytes,Ua: NEGATIVE
Nitrite: NEGATIVE
Protein, ur: NEGATIVE mg/dL
Specific Gravity, Urine: 1.008 (ref 1.005–1.030)
pH: 5 (ref 5.0–8.0)

## 2022-03-02 LAB — TYPE AND SCREEN
ABO/RH(D): A POS
Antibody Screen: NEGATIVE

## 2022-03-02 LAB — SURGICAL PCR SCREEN
MRSA, PCR: NEGATIVE
Staphylococcus aureus: NEGATIVE

## 2022-03-02 NOTE — Patient Instructions (Addendum)
Your procedure is scheduled on: Monday, October 9 Report to the Registration Desk on the 1st floor of the [facility]. To find out your arrival time, please call [phone] between 1PM - 3PM on: Friday, October 6 If your arrival time is 6:00 am, do not arrive prior to that time as the [place] entrance doors do not open until 6:00 am.  REMEMBER: Instructions that are not followed completely may result in serious medical risk, up to and including death; or upon the discretion of your surgeon and anesthesiologist your surgery may need to be rescheduled.  Do not eat food after midnight the night before surgery.  No gum chewing, lozengers or hard candies.  You may however, drink water up to 2 hours before you are scheduled to arrive for your surgery. Do not drink anything within 2 hours of your scheduled arrival time.  In addition, your doctor has ordered for you to drink the provided  Ensure Pre-Surgery Clear Carbohydrate Drink  Drinking this carbohydrate drink up to two hours before surgery helps to reduce insulin resistance and improve patient outcomes. Please complete drinking 2 hours prior to scheduled arrival time.  TAKE THESE MEDICATIONS THE MORNING OF SURGERY WITH A SIP OF WATER:  Albuterol nebulizer Levothyroxine Omeprazole (Prilosec) - (take one the night before and one on the morning of surgery - helps to prevent nausea after surgery.)  Use inhalers on the day of surgery and bring to the hospital.  One week prior to surgery: starting October 2 Stop Anti-inflammatories (NSAIDS) such as Advil, Aleve, Ibuprofen, Motrin, Naproxen, Naprosyn and Aspirin based products such as Excedrin, Goodys Powder, BC Powder. Stop ANY OVER THE COUNTER supplements until after surgery. Stop vitamin C, fish oil, turmeric, vitamin D, vitamin K You may however, continue to take Tylenol if needed for pain up until the day of surgery.  On the morning of surgery brush your teeth with toothpaste and  water, you may rinse your mouth with mouthwash if you wish. Do not swallow any toothpaste or mouthwash.  Use CHG Soap as directed on instruction sheet.  Do not wear jewelry, make-up, hairpins, clips or nail polish.  Do not wear lotions, powders, or perfumes.   Do not shave body from the neck down 48 hours prior to surgery just in case you cut yourself which could leave a site for infection.  Also, freshly shaved skin may become irritated if using the CHG soap.  Contact lenses, hearing aids and dentures may not be worn into surgery.  Do not bring valuables to the hospital. [facility] is not responsible for any missing/lost belongings or valuables.   Notify your doctor if there is any change in your medical condition (cold, fever, infection).  Wear comfortable clothing (specific to your surgery type) to the hospital.  After surgery, you can help prevent lung complications by doing breathing exercises.  Take deep breaths and cough every 1-2 hours. Your doctor may order a device called an Incentive Spirometer to help you take deep breaths.  If you are being admitted to the hospital overnight, leave your suitcase in the car. After surgery it may be brought to your room.  Please call the [place] Dept. at [phone] if you have any questions about these instructions.  Surgery Visitation Policy:  Patients undergoing a surgery or procedure may have two family members or support persons with them as long as the person is not COVID-19 positive or experiencing its symptoms.   Inpatient Visitation:  Visiting hours are 7 a.m. to 8 p.m. Up to four visitors are allowed at one time in a patient room, including children. The visitors may rotate out with other people during the day. One designated support person (adult) may remain overnight.      Preparing for Surgery with CHLORHEXIDINE GLUCONATE (CHG) Soap  Chlorhexidine Gluconate (CHG) Soap  o An antiseptic cleaner  that kills germs and bonds with the skin to continue killing germs even after washing  o Used for showering the night before surgery and morning of surgery  Before surgery, you can play an important role by reducing the number of germs on your skin.  CHG (Chlorhexidine gluconate) soap is an antiseptic cleanser which kills germs and bonds with the skin to continue killing germs even after washing.  Please do not use if you have an allergy to CHG or antibacterial soaps. If your skin becomes reddened/irritated stop using the CHG.  1. Shower the NIGHT BEFORE SURGERY and the MORNING OF SURGERY with CHG soap.  2. If you choose to wash your hair, wash your hair first as usual with your normal shampoo.  3. After shampooing, rinse your hair and body thoroughly to remove the shampoo.  4. Use CHG as you would any other liquid soap. You can apply CHG directly to the skin and wash gently with a scrungie or a clean washcloth.  5. Apply the CHG soap to your body only from the neck down. Do not use on open wounds or open sores. Avoid contact with your eyes, ears, mouth, and genitals (private parts). Wash face and genitals (private parts) with your normal soap.  6. Wash thoroughly, paying special attention to the area where your surgery will be performed.  7. Thoroughly rinse your body with warm water.  8. Do not shower/wash with your normal soap after using and rinsing off the CHG soap.  9. Pat yourself dry with a clean towel.  10. Wear clean pajamas to bed the night before surgery.  12. Place clean sheets on your bed the night of your first shower and do not sleep with pets.  13. Shower again with the CHG soap on the day of surgery prior to arriving at the hospital.  14. Do not apply any deodorants/lotions/powders.  15. Please wear clean clothes to the hospital.

## 2022-03-07 ENCOUNTER — Ambulatory Visit: Payer: MEDICARE

## 2022-03-14 ENCOUNTER — Observation Stay: Payer: Medicare (Managed Care)

## 2022-03-14 ENCOUNTER — Other Ambulatory Visit: Payer: Self-pay

## 2022-03-14 ENCOUNTER — Encounter
Admission: RE | Disposition: A | Discharge status: Home Health | Payer: Self-pay | Source: Ambulatory Visit | Attending: Orthopedic Surgery

## 2022-03-14 ENCOUNTER — Encounter: Payer: Self-pay | Admitting: *Deleted

## 2022-03-14 ENCOUNTER — Ambulatory Visit: Payer: Medicare (Managed Care) | Admitting: Anesthesiology

## 2022-03-14 ENCOUNTER — Ambulatory Visit: Payer: Medicare (Managed Care) | Admitting: Urgent Care

## 2022-03-14 ENCOUNTER — Observation Stay
Admission: RE | Admit: 2022-03-14 | Discharge: 2022-03-15 | Disposition: A | Discharge status: Home Health | Payer: Medicare (Managed Care) | Source: Ambulatory Visit | Attending: Orthopedic Surgery | Admitting: Orthopedic Surgery

## 2022-03-14 DIAGNOSIS — I10 Essential (primary) hypertension: Secondary | ICD-10-CM | POA: Insufficient documentation

## 2022-03-14 DIAGNOSIS — J449 Chronic obstructive pulmonary disease, unspecified: Secondary | ICD-10-CM | POA: Diagnosis not present

## 2022-03-14 DIAGNOSIS — E119 Type 2 diabetes mellitus without complications: Secondary | ICD-10-CM | POA: Insufficient documentation

## 2022-03-14 DIAGNOSIS — M1711 Unilateral primary osteoarthritis, right knee: Principal | ICD-10-CM | POA: Diagnosis present

## 2022-03-14 DIAGNOSIS — Z86718 Personal history of other venous thrombosis and embolism: Secondary | ICD-10-CM | POA: Insufficient documentation

## 2022-03-14 DIAGNOSIS — Z79899 Other long term (current) drug therapy: Secondary | ICD-10-CM | POA: Diagnosis not present

## 2022-03-14 DIAGNOSIS — J45909 Unspecified asthma, uncomplicated: Secondary | ICD-10-CM | POA: Diagnosis not present

## 2022-03-14 DIAGNOSIS — E039 Hypothyroidism, unspecified: Secondary | ICD-10-CM | POA: Diagnosis not present

## 2022-03-14 DIAGNOSIS — Z01812 Encounter for preprocedural laboratory examination: Secondary | ICD-10-CM

## 2022-03-14 DIAGNOSIS — Z7984 Long term (current) use of oral hypoglycemic drugs: Secondary | ICD-10-CM | POA: Insufficient documentation

## 2022-03-14 HISTORY — PX: TOTAL KNEE ARTHROPLASTY: SHX125

## 2022-03-14 LAB — ABO/RH: ABO/RH(D): A POS

## 2022-03-14 LAB — GLUCOSE, CAPILLARY
Glucose-Capillary: 305 mg/dL — ABNORMAL HIGH (ref 70–99)
Glucose-Capillary: 178 mg/dL — ABNORMAL HIGH (ref 70–99)

## 2022-03-14 SURGERY — ARTHROPLASTY, KNEE, TOTAL
Anesthesia: Spinal | Site: Knee | Laterality: Right

## 2022-03-14 MED ORDER — METOCLOPRAMIDE HCL 5 MG/ML IJ SOLN: 5.0000 mg | Freq: Three times a day (TID) | INTRAMUSCULAR | Status: DC | PRN

## 2022-03-14 MED ORDER — BUPIVACAINE LIPOSOME 1.3 % IJ SUSP
INTRAMUSCULAR | Status: AC
  Filled 2022-03-14: qty 20

## 2022-03-14 MED ORDER — IPRATROPIUM-ALBUTEROL 0.5-2.5 (3) MG/3ML IN SOLN
RESPIRATORY_TRACT | Status: AC
  Filled 2022-03-14: qty 3

## 2022-03-14 MED ORDER — EPHEDRINE SULFATE (PRESSORS) 50 MG/ML IJ SOLN
INTRAMUSCULAR | Status: DC | PRN
  Administered 2022-03-14: 10 mg via INTRAVENOUS

## 2022-03-14 MED ORDER — SODIUM CHLORIDE 0.9 % IV SOLN: INTRAVENOUS | Status: DC

## 2022-03-14 MED ORDER — ACETAMINOPHEN 500 MG PO TABS
1000.0000 mg | ORAL_TABLET | Freq: Three times a day (TID) | ORAL | Status: DC
  Administered 2022-03-14 – 2022-03-15 (×3): 1000 mg via ORAL
  Filled 2022-03-14 (×3): qty 2

## 2022-03-14 MED ORDER — PROPOFOL 1000 MG/100ML IV EMUL
INTRAVENOUS | Status: AC
  Filled 2022-03-14: qty 100

## 2022-03-14 MED ORDER — MIDAZOLAM HCL 5 MG/5ML IJ SOLN
INTRAMUSCULAR | Status: DC | PRN
  Administered 2022-03-14: 2 mg via INTRAVENOUS

## 2022-03-14 MED ORDER — TRAMADOL HCL 50 MG PO TABS
ORAL_TABLET | ORAL | Status: AC
  Filled 2022-03-14: qty 1

## 2022-03-14 MED ORDER — PANTOPRAZOLE SODIUM 40 MG PO TBEC
40.0000 mg | DELAYED_RELEASE_TABLET | Freq: Every day | ORAL | Status: DC
  Administered 2022-03-15: 40 mg via ORAL
  Filled 2022-03-14: qty 1

## 2022-03-14 MED ORDER — LEVOTHYROXINE SODIUM 112 MCG PO TABS
112.0000 ug | ORAL_TABLET | Freq: Every day | ORAL | Status: DC
  Administered 2022-03-15: 112 ug via ORAL
  Filled 2022-03-14: qty 1

## 2022-03-14 MED ORDER — DEXAMETHASONE SODIUM PHOSPHATE 10 MG/ML IJ SOLN
INTRAMUSCULAR | Status: DC | PRN
  Administered 2022-03-14: 10 mg via INTRAVENOUS

## 2022-03-14 MED ORDER — FENTANYL CITRATE (PF) 100 MCG/2ML IJ SOLN
INTRAMUSCULAR | Status: DC | PRN
  Administered 2022-03-14: 50 ug via INTRAVENOUS

## 2022-03-14 MED ORDER — HYDROCODONE-ACETAMINOPHEN 5-325 MG PO TABS: 1.0000 | ORAL_TABLET | ORAL | Status: DC | PRN

## 2022-03-14 MED ORDER — BUPIVACAINE-EPINEPHRINE (PF) 0.25% -1:200000 IJ SOLN: INTRAMUSCULAR | Status: DC | PRN

## 2022-03-14 MED ORDER — BUPIVACAINE-EPINEPHRINE (PF) 0.25% -1:200000 IJ SOLN
INTRAMUSCULAR | Status: AC
  Filled 2022-03-14: qty 30

## 2022-03-14 MED ORDER — SODIUM CHLORIDE 0.9 % IR SOLN
Status: DC | PRN
  Administered 2022-03-14: 3000 mL

## 2022-03-14 MED ORDER — 0.9 % SODIUM CHLORIDE (POUR BTL) OPTIME
TOPICAL | Status: DC | PRN
  Administered 2022-03-14: 1000 mL

## 2022-03-14 MED ORDER — ORAL CARE MOUTH RINSE: 15.0000 mL | OROMUCOSAL | Status: DC | PRN

## 2022-03-14 MED ORDER — TRANEXAMIC ACID-NACL 1000-0.7 MG/100ML-% IV SOLN
INTRAVENOUS | Status: DC | PRN
  Administered 2022-03-14: 1000 mg via INTRAVENOUS

## 2022-03-14 MED ORDER — ACETAMINOPHEN 10 MG/ML IV SOLN
INTRAVENOUS | Status: AC
  Filled 2022-03-14: qty 100

## 2022-03-14 MED ORDER — MENTHOL 3 MG MT LOZG: 1.0000 | LOZENGE | OROMUCOSAL | Status: DC | PRN

## 2022-03-14 MED ORDER — TRANEXAMIC ACID 1000 MG/10ML IV SOLN
INTRAVENOUS | Status: AC
  Filled 2022-03-14: qty 20

## 2022-03-14 MED ORDER — FENTANYL CITRATE (PF) 250 MCG/5ML IJ SOLN
INTRAMUSCULAR | Status: AC
  Filled 2022-03-14: qty 5

## 2022-03-14 MED ORDER — POLYVINYL ALCOHOL 1.4 % OP SOLN
2.0000 [drp] | Freq: Every day | OPHTHALMIC | Status: DC
  Administered 2022-03-15: 2 [drp] via OPHTHALMIC
  Filled 2022-03-14: qty 15

## 2022-03-14 MED ORDER — LACTATED RINGERS IV BOLUS
500.0000 mL | Freq: Once | INTRAVENOUS | Status: AC
  Administered 2022-03-14: 500 mL via INTRAVENOUS

## 2022-03-14 MED ORDER — CHLORHEXIDINE GLUCONATE 0.12 % MT SOLN
OROMUCOSAL | Status: AC
  Administered 2022-03-14: 15 mL via OROMUCOSAL
  Filled 2022-03-14: qty 15

## 2022-03-14 MED ORDER — CEFAZOLIN SODIUM-DEXTROSE 2-4 GM/100ML-% IV SOLN
2.0000 g | INTRAVENOUS | Status: AC
  Administered 2022-03-14: 2 g via INTRAVENOUS

## 2022-03-14 MED ORDER — IPRATROPIUM-ALBUTEROL 0.5-2.5 (3) MG/3ML IN SOLN
3.0000 mL | RESPIRATORY_TRACT | Status: DC
  Administered 2022-03-14: 3 mL via RESPIRATORY_TRACT

## 2022-03-14 MED ORDER — KETOROLAC TROMETHAMINE 30 MG/ML IJ SOLN
INTRAMUSCULAR | Status: AC
  Filled 2022-03-14: qty 1

## 2022-03-14 MED ORDER — SODIUM CHLORIDE FLUSH 0.9 % IV SOLN
INTRAVENOUS | Status: AC
  Filled 2022-03-14: qty 20

## 2022-03-14 MED ORDER — FENTANYL CITRATE (PF) 100 MCG/2ML IJ SOLN
INTRAMUSCULAR | Status: AC
  Filled 2022-03-14: qty 2

## 2022-03-14 MED ORDER — PHENOL 1.4 % MT LIQD: 1.0000 | OROMUCOSAL | Status: DC | PRN

## 2022-03-14 MED ORDER — METOCLOPRAMIDE HCL 5 MG PO TABS: 5.0000 mg | ORAL_TABLET | Freq: Three times a day (TID) | ORAL | Status: DC | PRN

## 2022-03-14 MED ORDER — INSULIN ASPART 100 UNIT/ML IJ SOLN
INTRAMUSCULAR | Status: AC
  Administered 2022-03-14: 5 [IU] via INTRAVENOUS
  Filled 2022-03-14: qty 1

## 2022-03-14 MED ORDER — DEXMEDETOMIDINE HCL IN NACL 200 MCG/50ML IV SOLN
INTRAVENOUS | Status: DC | PRN
  Administered 2022-03-14 (×2): 8 ug via INTRAVENOUS

## 2022-03-14 MED ORDER — CEFAZOLIN SODIUM-DEXTROSE 2-4 GM/100ML-% IV SOLN
INTRAVENOUS | Status: AC
  Filled 2022-03-14: qty 100

## 2022-03-14 MED ORDER — CHLORHEXIDINE GLUCONATE 0.12 % MT SOLN: 15.0000 mL | Freq: Once | OROMUCOSAL | Status: AC

## 2022-03-14 MED ORDER — PROPOFOL 500 MG/50ML IV EMUL
INTRAVENOUS | Status: DC | PRN
  Administered 2022-03-14: 150 ug/kg/min via INTRAVENOUS

## 2022-03-14 MED ORDER — ENOXAPARIN SODIUM 30 MG/0.3ML IJ SOSY
30.0000 mg | PREFILLED_SYRINGE | Freq: Two times a day (BID) | INTRAMUSCULAR | Status: DC
  Administered 2022-03-15: 30 mg via SUBCUTANEOUS
  Filled 2022-03-14: qty 0.3

## 2022-03-14 MED ORDER — CYCLOBENZAPRINE HCL 10 MG PO TABS
10.0000 mg | ORAL_TABLET | Freq: Three times a day (TID) | ORAL | Status: DC | PRN
  Administered 2022-03-14: 10 mg via ORAL
  Filled 2022-03-14: qty 1

## 2022-03-14 MED ORDER — PHENYLEPHRINE HCL-NACL 20-0.9 MG/250ML-% IV SOLN
INTRAVENOUS | Status: DC | PRN
  Administered 2022-03-14: 50 ug/min via INTRAVENOUS

## 2022-03-14 MED ORDER — ENALAPRIL MALEATE 20 MG PO TABS
20.0000 mg | ORAL_TABLET | Freq: Every day | ORAL | Status: DC
  Filled 2022-03-14: qty 1

## 2022-03-14 MED ORDER — ALBUMIN HUMAN 5 % IV SOLN: 12.5000 g | Freq: Once | INTRAVENOUS | Status: AC

## 2022-03-14 MED ORDER — INSULIN ASPART 100 UNIT/ML IJ SOLN: 5.0000 [IU] | Freq: Once | INTRAMUSCULAR | Status: AC

## 2022-03-14 MED ORDER — CEFAZOLIN SODIUM-DEXTROSE 2-4 GM/100ML-% IV SOLN
2.0000 g | Freq: Four times a day (QID) | INTRAVENOUS | Status: AC
  Administered 2022-03-14 (×2): 2 g via INTRAVENOUS
  Filled 2022-03-14 (×2): qty 100

## 2022-03-14 MED ORDER — KETOROLAC TROMETHAMINE 15 MG/ML IJ SOLN
7.5000 mg | Freq: Four times a day (QID) | INTRAMUSCULAR | Status: AC
  Administered 2022-03-14 – 2022-03-15 (×4): 7.5 mg via INTRAVENOUS
  Filled 2022-03-14 (×3): qty 1

## 2022-03-14 MED ORDER — PANTOPRAZOLE SODIUM 40 MG PO TBEC: 40.0000 mg | DELAYED_RELEASE_TABLET | Freq: Every day | ORAL | Status: DC

## 2022-03-14 MED ORDER — FENTANYL CITRATE (PF) 100 MCG/2ML IJ SOLN: 25.0000 ug | INTRAMUSCULAR | Status: DC | PRN

## 2022-03-14 MED ORDER — ACETAMINOPHEN 10 MG/ML IV SOLN
INTRAVENOUS | Status: DC | PRN
  Administered 2022-03-14: 1000 mg via INTRAVENOUS

## 2022-03-14 MED ORDER — TRAMADOL HCL 50 MG PO TABS
50.0000 mg | ORAL_TABLET | Freq: Four times a day (QID) | ORAL | Status: DC | PRN
  Administered 2022-03-14 – 2022-03-15 (×2): 50 mg via ORAL
  Filled 2022-03-14: qty 1

## 2022-03-14 MED ORDER — SODIUM CHLORIDE 0.9 % IV SOLN: INTRAVENOUS | Status: DC | PRN

## 2022-03-14 MED ORDER — BUPIVACAINE HCL (PF) 0.5 % IJ SOLN
INTRAMUSCULAR | Status: DC | PRN
  Administered 2022-03-14: 3 mL

## 2022-03-14 MED ORDER — ALBUMIN HUMAN 5 % IV SOLN
INTRAVENOUS | Status: AC
  Administered 2022-03-14: 12.5 g via INTRAVENOUS
  Filled 2022-03-14: qty 250

## 2022-03-14 MED ORDER — ONDANSETRON HCL 4 MG PO TABS: 4.0000 mg | ORAL_TABLET | Freq: Four times a day (QID) | ORAL | Status: DC | PRN

## 2022-03-14 MED ORDER — ALBUTEROL SULFATE (2.5 MG/3ML) 0.083% IN NEBU: 3.0000 mL | INHALATION_SOLUTION | RESPIRATORY_TRACT | Status: DC | PRN

## 2022-03-14 MED ORDER — SODIUM CHLORIDE (PF) 0.9 % IJ SOLN
INTRAMUSCULAR | Status: DC | PRN
  Administered 2022-03-14: 70 mL via INTRAMUSCULAR

## 2022-03-14 MED ORDER — ORAL CARE MOUTH RINSE: 15.0000 mL | Freq: Once | OROMUCOSAL | Status: AC

## 2022-03-14 MED ORDER — ONDANSETRON HCL 4 MG/2ML IJ SOLN: 4.0000 mg | Freq: Once | INTRAMUSCULAR | Status: DC | PRN

## 2022-03-14 MED ORDER — KETOROLAC TROMETHAMINE 15 MG/ML IJ SOLN
INTRAMUSCULAR | Status: AC
  Filled 2022-03-14: qty 1

## 2022-03-14 MED ORDER — ONDANSETRON HCL 4 MG/2ML IJ SOLN: 4.0000 mg | Freq: Four times a day (QID) | INTRAMUSCULAR | Status: DC | PRN

## 2022-03-14 MED ORDER — DOCUSATE SODIUM 100 MG PO CAPS
100.0000 mg | ORAL_CAPSULE | Freq: Two times a day (BID) | ORAL | Status: DC
  Administered 2022-03-14 – 2022-03-15 (×2): 100 mg via ORAL
  Filled 2022-03-14 (×2): qty 1

## 2022-03-14 MED ORDER — FUROSEMIDE 20 MG PO TABS: 20.0000 mg | ORAL_TABLET | Freq: Every day | ORAL | Status: DC

## 2022-03-14 MED ORDER — MIDAZOLAM HCL 2 MG/2ML IJ SOLN
INTRAMUSCULAR | Status: AC
  Filled 2022-03-14: qty 2

## 2022-03-14 MED ORDER — MORPHINE SULFATE (PF) 2 MG/ML IV SOLN: 0.5000 mg | INTRAVENOUS | Status: DC | PRN

## 2022-03-14 SURGICAL SUPPLY — 71 items
BLADE REAMER PATELLA SZ29 (BLADE) IMPLANT
BLADE SAGITTAL WIDE XTHICK NO (BLADE) ×1 IMPLANT
BLADE SAW SAG 25X90X1.19 (BLADE) ×1 IMPLANT
BLADE SAW SAG 29X58X.64 (BLADE) IMPLANT
BNDG ELASTIC 6X5.8 VLCR STR LF (GAUZE/BANDAGES/DRESSINGS) ×1 IMPLANT
BOWL CEMENT MIX W/ADAPTER (MISCELLANEOUS) ×1 IMPLANT
CEMENT BONE R 1X40 (Cement) ×2 IMPLANT
CHLORAPREP W/TINT 26 (MISCELLANEOUS) ×2 IMPLANT
COMP FEM CMT PERSONA SZ7 RT (Joint) ×1 IMPLANT
COMPONENT FEM CMT PRSONA SZ7RT (Joint) IMPLANT
COOLER POLAR GLACIER W/PUMP (MISCELLANEOUS) ×1 IMPLANT
CUFF TOURN SGL QUICK 24 (TOURNIQUET CUFF)
CUFF TOURN SGL QUICK 34 (TOURNIQUET CUFF)
CUFF TRNQT CYL 24X4X16.5-23 (TOURNIQUET CUFF) IMPLANT
CUFF TRNQT CYL 34X4.125X (TOURNIQUET CUFF) IMPLANT
DERMABOND ADVANCED .7 DNX12 (GAUZE/BANDAGES/DRESSINGS) ×1 IMPLANT
DRAPE 3/4 80X56 (DRAPES) ×1 IMPLANT
DRAPE INCISE IOBAN 66X60 STRL (DRAPES) IMPLANT
DRAPE U-SHAPE 47X51 STRL (DRAPES) ×1 IMPLANT
DRSG MEPILEX SACRM 8.7X9.8 (GAUZE/BANDAGES/DRESSINGS) ×1 IMPLANT
DRSG OPSITE POSTOP 4X10 (GAUZE/BANDAGES/DRESSINGS) IMPLANT
DRSG OPSITE POSTOP 4X8 (GAUZE/BANDAGES/DRESSINGS) IMPLANT
ELECT CAUTERY BLADE 6.4 (BLADE) ×1 IMPLANT
ELECT REM PT RETURN 9FT ADLT (ELECTROSURGICAL) ×1
ELECTRODE REM PT RTRN 9FT ADLT (ELECTROSURGICAL) ×1 IMPLANT
GLOVE PROTEXIS LATEX SZ 7.5 (GLOVE) ×3 IMPLANT
GLOVE SURG LATEX 7.5 PF (GLOVE) ×3 IMPLANT
GOWN STRL REUS W/ TWL LRG LVL3 (GOWN DISPOSABLE) ×1 IMPLANT
GOWN STRL REUS W/ TWL XL LVL3 (GOWN DISPOSABLE) ×1 IMPLANT
GOWN STRL REUS W/TWL LRG LVL3 (GOWN DISPOSABLE) ×1
GOWN STRL REUS W/TWL XL LVL3 (GOWN DISPOSABLE) ×1
HDLS TROCR DRIL PIN KNEE 75 (PIN) ×1
HOOD PEEL AWAY FLYTE STAYCOOL (MISCELLANEOUS) ×3 IMPLANT
INSERT TIB ASF PS CD/6-7 RT 13 (Insert) IMPLANT
IV NS IRRIG 3000ML ARTHROMATIC (IV SOLUTION) ×1 IMPLANT
KIT TURNOVER KIT A (KITS) ×1 IMPLANT
MANIFOLD NEPTUNE II (INSTRUMENTS) ×1 IMPLANT
MARKER SKIN DUAL TIP RULER LAB (MISCELLANEOUS) ×1 IMPLANT
MAT ABSORB  FLUID 56X50 GRAY (MISCELLANEOUS) ×1
MAT ABSORB FLUID 56X50 GRAY (MISCELLANEOUS) ×1 IMPLANT
NDL HYPO 21X1.5 SAFETY (NEEDLE) ×1 IMPLANT
NEEDLE HYPO 21X1.5 SAFETY (NEEDLE) ×1 IMPLANT
NS IRRIG 500ML POUR BTL (IV SOLUTION) ×1 IMPLANT
PACK TOTAL KNEE (MISCELLANEOUS) ×1 IMPLANT
PAD WRAPON POLAR KNEE (MISCELLANEOUS) ×1 IMPLANT
PIN DRILL HDLS TROCAR 75 4PK (PIN) IMPLANT
PULSAVAC PLUS IRRIG FAN TIP (DISPOSABLE) ×1
SCREW FEMALE HEX FIX 25X2.5 (ORTHOPEDIC DISPOSABLE SUPPLIES) IMPLANT
SCREW HEX HEADED 3.5X27 DISP (ORTHOPEDIC DISPOSABLE SUPPLIES) IMPLANT
SOLUTION IRRIG SURGIPHOR (IV SOLUTION) ×1 IMPLANT
STEM POLY PAT PLY 29M KNEE (Knees) IMPLANT
STEM TIB ST PERS 14+30 (Stem) IMPLANT
STEM TIBIA 5 DEG SZ D R KNEE (Knees) IMPLANT
SUT DVC 2 QUILL PDO  T11 36X36 (SUTURE) ×1
SUT DVC 2 QUILL PDO T11 36X36 (SUTURE) ×1 IMPLANT
SUT DVC VLOC 3-0 CL 6 P-12 (SUTURE) ×1 IMPLANT
SUT V-LOC 90 ABS DVC 3-0 CL (SUTURE) ×1 IMPLANT
SUT VIC AB 0 CT1 36 (SUTURE) ×1 IMPLANT
SUT VIC AB 1 CT1 36 (SUTURE) ×1 IMPLANT
SUT VIC AB 2-0 CT2 27 (SUTURE) ×1 IMPLANT
SUT VICRYL 1-0 27IN ABS (SUTURE) ×1
SUTURE VICRYL 1-0 27IN ABS (SUTURE) ×1 IMPLANT
SYR 20ML LL LF (SYRINGE) ×2 IMPLANT
SYR 3ML LL SCALE MARK (SYRINGE) ×1 IMPLANT
TIBIA STEM 5 DEG SZ D R KNEE (Knees) ×1 IMPLANT
TIP FAN IRRIG PULSAVAC PLUS (DISPOSABLE) ×1 IMPLANT
TOWEL OR 17X26 4PK STRL BLUE (TOWEL DISPOSABLE) ×1 IMPLANT
TRAP FLUID SMOKE EVACUATOR (MISCELLANEOUS) ×1 IMPLANT
TUBE KAMVAC SUCTION (TUBING) ×1 IMPLANT
WATER STERILE IRR 1000ML POUR (IV SOLUTION) ×1 IMPLANT
WRAPON POLAR PAD KNEE (MISCELLANEOUS) ×1

## 2022-03-14 NOTE — Transfer of Care (Signed)
Immediate Anesthesia Transfer of Care Note  Patient: Honor Frison  Procedure(s) Performed: TOTAL KNEE ARTHROPLASTY (Right: Knee)  Patient Location: PACU  Anesthesia Type:Spinal  Level of Consciousness: awake, alert  and oriented  Airway & Oxygen Therapy: Patient Spontanous Breathing and Patient connected to face mask oxygen  Post-op Assessment: Report given to RN and Post -op Vital signs reviewed and stable  Post vital signs: Reviewed and stable  Last Vitals:  Vitals Value Taken Time  BP 73/44 03/14/22 1015  Temp    Pulse 78 03/14/22 1016  Resp    SpO2 99 % 03/14/22 1016  Vitals shown include unvalidated device data.  Last Pain:  Vitals:   03/14/22 0639  TempSrc: Temporal  PainSc: 7          Complications: No notable events documented.

## 2022-03-14 NOTE — Plan of Care (Signed)
  Problem: Education: Goal: Knowledge of the prescribed therapeutic regimen will improve 03/14/2022 1752 by Montel Culver, RN Outcome: Progressing 03/14/2022 1508 by Montel Culver, RN Outcome: Progressing Goal: Individualized Educational Video(s) 03/14/2022 1752 by Montel Culver, RN Outcome: Progressing 03/14/2022 1508 by Montel Culver, RN Outcome: Progressing   Problem: Activity: Goal: Ability to avoid complications of mobility impairment will improve Outcome: Progressing Goal: Range of joint motion will improve Outcome: Progressing   Problem: Clinical Measurements: Goal: Postoperative complications will be avoided or minimized Outcome: Progressing   Problem: Pain Management: Goal: Pain level will decrease with appropriate interventions Outcome: Progressing   Problem: Skin Integrity: Goal: Will show signs of wound healing Outcome: Progressing   Problem: Education: Goal: Knowledge of General Education information will improve Description: Including pain rating scale, medication(s)/side effects and non-pharmacologic comfort measures Outcome: Progressing   Problem: Health Behavior/Discharge Planning: Goal: Ability to manage health-related needs will improve Outcome: Progressing   Problem: Clinical Measurements: Goal: Ability to maintain clinical measurements within normal limits will improve Outcome: Progressing Goal: Will remain free from infection Outcome: Progressing Goal: Diagnostic test results will improve Outcome: Progressing Goal: Respiratory complications will improve Outcome: Progressing Goal: Cardiovascular complication will be avoided Outcome: Progressing   Problem: Activity: Goal: Risk for activity intolerance will decrease Outcome: Progressing   Problem: Nutrition: Goal: Adequate nutrition will be maintained Outcome: Progressing   Problem: Coping: Goal: Level of anxiety will decrease Outcome: Progressing   Problem:  Elimination: Goal: Will not experience complications related to bowel motility Outcome: Progressing Goal: Will not experience complications related to urinary retention Outcome: Progressing   Problem: Pain Managment: Goal: General experience of comfort will improve Outcome: Progressing   Problem: Safety: Goal: Ability to remain free from injury will improve Outcome: Progressing   Problem: Skin Integrity: Goal: Risk for impaired skin integrity will decrease Outcome: Progressing

## 2022-03-14 NOTE — Interval H&P Note (Signed)
History and Physical Interval Note:  03/14/2022 7:26 AM  [name]  has presented today for surgery, with the diagnosis of Primary osteoarthritis of right knee  M17.11.  The various methods of treatment have been discussed with the patient and family. After consideration of risks, benefits and other options for treatment, the patient has consented to  Procedure(s): TOTAL KNEE ARTHROPLASTY (Right) as a surgical intervention.  The patient's history has been reviewed, patient examined, no change in status, stable for surgery.  I have reviewed the patient's chart and labs.  Questions were answered to the patient's satisfaction.     Steffanie Rainwater

## 2022-03-14 NOTE — Anesthesia Procedure Notes (Signed)
Date/Time: 03/14/2022 7:56 AM  Performed by: Nelda Marseille, CRNAPre-anesthesia Checklist: Patient identified, Emergency Drugs available, Suction available, Patient being monitored and Timeout performed Oxygen Delivery Method: Simple face mask

## 2022-03-14 NOTE — Op Note (Signed)
Patient Name: [name]  [id number]  Pre-Operative Diagnosis: Right knee Osteoarthritis  Post-Operative Diagnosis: Right knee osteoarthritis  Procedure: Right Total Knee Arthroplasty  Components/Implants: Femur: Persona Size 7 Right   Tibia Persona Size D w/ Stubby 14*30 Right  Poly 30mm MC  Patella 66mmx8  Date of Surgery: 03/14/2022  Surgeon: Steffanie Rainwater MD  Assistant: Dorise Hiss PA (present and scrubbed throughout the case, critical for assistance with exposure, retraction, instrumentation, and closure)   Anesthesiologist: Rosey Bath MD  Anesthesia: Spinal  Tourniquet Time: 69 min  EBL: 25  IVF: [id number]  Complications: None   Brief history: The patient is a 76 year old female with a history of osteoarthritis of the right knee with pain limiting their range of motion and activities of daily living, which has failed multiple attempts at conservative therapy.  The risks and benefits of total knee arthroplasty as definitive surgical treatment were discussed with the patient, who opted to proceed with the operation.  After outpatient medical clearance and optimization was completed the patient was admitted to [facility] for the procedure.  All preoperative films were reviewed and an appropriate surgical plan was made prior to surgery. Preoperative range of motion was 0 to 130 with no flexion contracture. The patient was identified as having a Valgus alignment.   Description of procedure: The patient was brought to the operating room where laterality was confirmed by all those present to be the right side.   Spinal anesthesia was administered and the patient received an intravenous dose of antibiotics for surgical prophylaxis and a dose of tranexamic acid during the case.  Patient is positioned supine on the operating room table with all bony prominences well-padded.  A well-padded tourniquet was applied to the right thigh.  The knee was then prepped and  draped in usual sterile fashion with multiple layers of adhesive and nonadhesive drapes.  All of those present in the operating room participated in a surgical timeout laterality and patient were confirmed.   An Esmarch was wrapped around the extremity and the leg was elevated and the knee flexed.  The tourniquet was inflated to a pressure of  275 mmHg. The Esmarch was removed and the leg was brought down to full extension.  The patella and tibial tubercle identified and outlined using a marking pen and a midline skin incision was made with a knife carried through the subcutaneous tissue down to the extensor retinaculum.  After exposure of the extensor mechanism the medial parapatellar arthrotomy was performed with a scalpel and electrocautery extending down medial and distal to the tibial tubercle taking care to avoid incising the patellar tendon.   A standard medial release was performed over the proximal tibia.  The knee was brought into extension in order to excise the fat pad taking care not to damage the patella tendon.  The superior soft tissue was removed from the anterior surface of the distal femur to visualize for the procedure.  The knee was then brought into flexion with the patella subluxed laterally and subluxing the tibia anteriorly.  The ACL was transected and removed with electrocautery and additional soft tissue was removed from the proximal surface of the tibia to fully expose. 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 The lateral femoral condyle and lateral edge of the trochlea additionally had grade 4 changes and eburnated throughout the entire weight bearing zone with lateral femoral condyle osteophytes  noted.   An extramedullary tibial cutting guide was then applied to the leg with a spring-loaded ankle clamp placed around the distal tibia just above the malleoli the angulation of the  guide was adjusted to give some posterior slope in the tibial resection with a neutral varus valgus alignment.  The resection guide was then pinned to the proximal tibia and the proximal tibial surface was resected with an oscillating saw.  Careful attention was paid to ensure the blade did not disrupt any of the soft tissues including any lateral or medial ligament.  Attention was then turned to the femur, with the knee slightly flexed a opening drill was used to enter the medullary canal of the femur.  After removing the drill marrow was suctioned out to decompress the distal femur.  An intramedullary femoral guide was then inserted into the drill hole and the alignment guide was seated firmly against the distal end of the medial femoral condyle.  The distal femoral cutting guide was then attached and pinned securely to the anterior surface of the femur and the intramedullary rod and alignment guide was removed.  Distal femur resection was then performed with an oscillating saw with retractors protecting medial and laterally.   The distal cutting block was then removed and the extension gap was checked with a spacer.  Extension gap was found to be appropriately sized to accommodate the spacer block.  The femoral sizing guide was then placed securely into the posterior condyles of the femur and the femoral size was measured and determined to be 7.  The size 7; 4-in-1 cutting guide was placed in position and secured with 2 pins.  The anterior posterior and chamfer resections were then performed with an oscillating saw.  Bony fragments and osteophytes were then removed.  Using a lamina spreader the posterior medial and lateral condyles were checked for additional osteophytes and posterior soft tissue remnants.  Any remaining meniscus was removed at this time.   The tibia was then exposed and the tibial trial was pinned onto the plateau after confirming appropriate orientation and rotation.  Using the drill  bushing the tibia was prepared to the appropriate drill depth.  Tibial broach impactor was then driven through the punch guide using a mallet.   The femoral trial component was then inserted onto the femur. A trial tibial polyethylene bearing was then placed and the knee was reduced.  The knee achieved full extension with no hyperextension and was found to be balanced in flexion and extension with the trials in place.  The knee was then brought into full extension the patella was everted and held with 2 Kocher clamps.  The articular surface of the patella was then resected with an patella reamer and saw after careful measurement with a caliper.  The patella was then prepared with the drill guide and a trial patella was placed.  The knee was then taken through range of motion and it was found that the patella circulated appropriately with the trochlea and good patellofemoral motion without subluxation.    The correct final components for implantation were confirmed and opened by the circulator nurse.  The prepared surfaces of the patella femur and tibia were cleaned with pulsatile lavage to remove all blood fat and other material and then the surfaces were dried.  2 bags of cement were mixed under vacuum and the components were cemented into place.  Excess cement was removed with curettes and forceps.  The final polyethylene tibial component was implanted  and the knee was brought into full extension to allow the cement to set.  At this time the periarticular injection cocktail was placed in the soft tissues surrounding the knee.  The knee was then irrigated with copious amount of normal saline via pulsatile lavage to remove all loose bodies and other debris.  The knee was then irrigated with surgiphor betadine based wash and reirrigated with saline.  The tourniquet was then dropped and all bleeding vessels were identified and coagulated.  The arthrotomy was approximated with #1 Vicryl and closed with #2 Quill  suture.  The knee was brought into slight flexion and the subcutaneous tissues were closed with 0 Vicryl, 2-0 Vicryl and a running subcuticular 3-0 vlock suture.  Skin was then glued with Dermabond.  A sterile adhesive dressing was then placed along with a sequential compression device to the calf, a Ted stocking, and a cryotherapy cuff.   Sponge, needle, and Lap counts were all correct at the end of the case.   The patient was transferred off of the operating room table to a hospital bed, good pulses were found distally on the operative side.  The patient was transferred to the recovery room in stable condition.

## 2022-03-14 NOTE — Plan of Care (Signed)
  Problem: Education: Goal: Knowledge of the prescribed therapeutic regimen will improve Outcome: Progressing Goal: Individualized Educational Video(s) Outcome: Progressing   

## 2022-03-14 NOTE — H&P (Signed)
History of Present Illness: The patient is an 76 y.o. female seen in clinic today for left knee pain and giving way.  Symptoms have been present for 2 year and began with normal activities.  The pain is described as gnawing, sharp, and shooting.  Pain is generalized. Currently pain is aggravated with normal daily activities, using stairs, and rising from a chair.  She has no associated symptoms.  She has tried acetaminophen and non-steroidal anti-inflammatories (Advil) with partial relief for several hours .  She has tried physical therapy , rest , and ice   cortisone and gel injections.  Patient reports her last injection gave her almost no relief.  She reports having a knee arthroscopy in 2019 for a torn meniscus in her right knee and was told by the doctor at that time that she had no cartilage left in her knee and was going to need a knee replacement. pain is relieved by ice and rest.    Denies any trauma in her knee any fevers chills or other recent injuries.  She currently resides at [facility] care and walks 1 to 2 miles twice weekly.  He has a history of multiple falls with injuries to her left upper extremity status post a left wrist fusion and ORIF to her left humerus which limits her use of her left arm.  Due to the issues with her left arm and some underlying dizziness when she leaves [place] she primarily uses a wheelchair for ambulation, though she is able to walk with a cane.  Patient reports a remote history of DVT in the lower right leg when she was young.    Past Medical History:     Past Medical History:  Diagnosis Date   Asthma, unspecified asthma severity, unspecified whether complicated, unspecified whether persistent     COPD (chronic obstructive pulmonary disease) (CMS-HCC)     Hypertension     Thyroid disease        Past Surgical History:      Past Surgical History:  Procedure Laterality Date   EGD @ [facility]   01/17/2022    Gastritis/Intestinal metaplasia/Hyperplastic gastric  polyp/Repeat 2yr/SMR   LAPAROSCOPIC CHOLECYSTECTOMY       left arm surgery       THYROIDECTOMY TOTAL          Past Family History: Family History       Family History  Problem Relation Age of Onset   Kidney failure Mother     Tuberculosis Father         Medications:       Current Outpatient Medications Ordered in Epic  Medication Sig Dispense Refill   FUROsemide (LASIX) 40 MG tablet Take 40 mg by mouth once daily       albuterol (PROVENTIL) 2.5 mg /3 mL (0.083 %) nebulizer solution Take 2.5 mg by nebulization as needed       aspirin 325 MG tablet Take 325 mg by mouth once daily (Patient not taking: Reported on 02/16/2022)       atenoloL (TENORMIN) 25 MG tablet Take 25 mg by mouth once daily       atorvastatin (LIPITOR) 20 MG tablet Take 20 mg by mouth once daily (Patient not taking: Reported on 02/16/2022)       carboxymethylcellulose (REFRESH TEARS) 0.5 % ophthalmic solution Place 1-2 drops into both eyes as needed for Dry Eyes       cholecalciferol (VITAMIN D3) 1000 unit tablet Take 1,500 Units by mouth once daily  clindamycin (CLEOCIN) 300 MG capsule Take 300 mg by mouth 3 (three) times daily       EAR DROPS, CARBAMIDE PEROXIDE, 6.5 % otic solution as needed       enalapril (VASOTEC) 10 MG tablet Take 10 mg by mouth once daily       enalapril (VASOTEC) 20 MG tablet Take 20 mg by mouth once daily (Patient not taking: Reported on 02/09/2022)       esomeprazole (NEXIUM) 40 MG DR capsule Take 1 capsule (40 mg total) by mouth 2 (two) times daily for 90 days 60 capsule 2   EUCERIN ORIGINAL lotion Apply topically as needed       FEROSUL 325 mg (65 mg iron) tablet Take 325 mg by mouth once daily       fluticasone propionate (FLONASE) 50 mcg/actuation nasal spray Place 2 sprays into both nostrils once daily       FUROsemide (LASIX) 20 MG tablet Take 20 mg by mouth once daily (Patient not taking: Reported on 02/16/2022)       gabapentin (NEURONTIN) 300 MG capsule Take 300 mg by mouth 3  (three) times daily       hydrocortisone 1 % cream Apply 1 Application! topically once daily       ibuprofen (MOTRIN) 800 MG tablet Take 800 mg by mouth every 6 (six) hours as needed for Pain       ketoconazole (NIZORAL) 2 % shampoo Apply topically once daily       levothyroxine (SYNTHROID) 175 MCG tablet Take 175 mcg by mouth once daily Take on an empty stomach with a glass of water at least 30-60 minutes before breakfast.       lidocaine (LIDODERM) 5 % patch Place 1 patch onto the skin daily       lisinopriL (ZESTRIL) 20 MG tablet Take 20 mg by mouth once daily       omeprazole (PRILOSEC) 20 MG DR capsule Take 20 mg by mouth 2 (two) times daily as needed       ondansetron (ZOFRAN) 8 MG tablet Take 8 mg by mouth as needed       potassium chloride (KLOR-CON) 10 MEQ ER tablet Take 10 mEq by mouth once daily       REFRESH OPTIVE ADVANCED, PF, 0.5-1-0.5 % Dpet Place 2 drops into both eyes once daily       RETAINE MGD, PF, 0.5-0.5 % Dpet Place 1 drop into both eyes once daily       TYLENOL ARTHRITIS PAIN 650 mg ER tablet Take 650 mg by mouth every 8 (eight) hours as needed       XIIDRA 5 % ophthalmic solution Place 1 drop into both eyes 2 (two) times daily        No current Epic-ordered facility-administered medications on file.      Allergies:      Allergies  Allergen Reactions   Iodinated Contrast Media Swelling      Facial swelling      Review of Systems:  A comprehensive 14 point ROS was performed, reviewed, and the pertinent orthopaedic findings are documented in the HPI.   Physical Exam: General/Constitutional: No apparent distress: well-nourished and well developed. Eyes: Pupils equal, round with synchronous movement. Pulmonary exam: Lungs clear to auscultation bilaterally no wheezing rales or rhonchi Cardiac exam: Regular rate and rhythm no obvious murmurs rubs or gallops.   Integumentary: No impressive skin lesions present, except as noted in detailed exam. Neuro/Psych: Normal  mood and  affect, oriented to person, place and time.   Comprehensive Knee Exam: Gait Antalgic with a cane  Alignment Neutral    Inspection   Right Left  Skin Normal appearance with no obvious deformity.  No ecchymosis or erythema.  Healed arthroscopic portals Normal appearance with no obvious deformity.  No ecchymosis or erythema.  Soft Tissue No focal soft tissue swelling No focal soft tissue swelling  Quad Atrophy None None    Palpation    Right Left  Tenderness Tender to palpation peripatellar and medial joint line No peripatellar, patellar tendon, quad tendon, medial/lateral joint line pain  Crepitus No tibiofemoral crepitus + Patellofemoral crepitus No patellofemoral or tibiofemoral crepitus  Effusion None None    Range of Motion   Right Left  Flexion  0-120 with pain at max flexion 0-120  Extension  Full knee extension without hyperextension Full knee extension without hyperextension    Ligamentous Exam   Right Left  Lachman Normal Normal  Valgus 0 Normal Normal  Valgus 30 Normal Normal  Varus 0 Normal Normal  Varus 30 Normal Normal  Anterior Drawer Normal Normal  Posterior Drawer Normal Normal    Meniscal Exam   Right Left  Hyperflexion Test Positive Negative  Hyperextension Test Negative Negative  McMurray's Positive Negative   Neurovascular   Right Left  Quadriceps Strength 5/5 5/5  Hamstring Strength 5/5 5/5  Hip Abductor Strength 4/5 4/5  Distal Motor Normal Normal  Distal Sensory Normal light touch sensation Normal light touch sensation  Distal Pulses Normal Normal      Imaging Studies: Knee Imaging (sm): X-rays reviewed of the right knee from 02/09/2022 AP lateral and sunrise view showing patellofemoral joint arthrosis and joint space narrowing sclerosis in the medial and lateral proximal tibia osteophyte formation over the medial femoral condyle and mild joint space narrowing consistent with moderate osteoarthritic changes.    X-ray Lutricia Feil view of  the right knee taken today shows medial joint space narrowing and more noted sclerosis and a large osteophyte over the medial femoral condyle.    Assessment:      ICD-10-CM  1. Primary osteoarthritis of right knee  M17.11      Plan: Mariame is a 76 year old female who presents with right knee bone on bone arthritis. Based upon the patient's continued symptoms and failure to respond to conservative treatment, I have recommended a right total knee replacement for this patient. A long discussion took place with the patient describing what a total joint replacement is and what the procedure would entail. A knee model, similar to the implants that will be used during the operation, was utilized to demonstrate the implants. Choices of implant manufactures were discussed and reviewed. The ability to secure the implant utilizing cement or cementless (press fit) fixation was discussed. The approach and exposure was discussed.    The hospitalization and post-operative care and rehabilitation were also discussed. The use of perioperative antibiotics and DVT prophylaxis were discussed. The risk, benefits and alternatives to a surgical intervention were discussed at length with the patient. The patient was also advised of risks related to the medical comorbidities and elevated body mass index (BMI). A lengthy discussion took place to review the most common complications including but not limited to: deep vein thrombosis, pulmonary embolus, heart attack, stroke, infection, wound breakdown, numbness, damage to nerves, tendon,muscles, arteries or other blood vessels, death and other possible complications from anesthesia. The patient was told that we will take steps to minimize these risks by  using sterile technique, antibiotics and DVT prophylaxis when appropriate and follow the patient postoperatively in the office setting to monitor progress. The possibility of recurrent pain, no improvement in pain and actual worsening  of pain were also discussed with the patient.    The discharge plan of care focused on the patient going home following surgery. The patient was encouraged to make the necessary arrangements to have someone stay with them when they are discharged home.    The benefits of surgery were discussed with the patient including the potential for improving the patient's current clinical condition through operative intervention. Alternatives to surgical intervention including continued conservative management were also discussed in detail. All questions were answered to the satisfaction of the patient. The patient participated and agreed to the plan of care as well as the use of the recommended implants for their total knee replacement surgery.        Patient received outpatient medical clearance and a negative doppler was completed. A1C was slightly elevated at 7.8 but below my hard cutoff of 8.0. I discussed with the patient the importance of dietary control and will have patient follow-up future treatment with medical provider outpatient.   History confirmed and exam unchanged today 03/14/2022.   Steffanie Rainwater

## 2022-03-14 NOTE — Evaluation (Signed)
Physical Therapy Evaluation Patient Details Name: [name] MRN: [id number] DOB: 1945/07/02 Today's Date: 03/14/2022  History of Present Illness  Pt is a 76 yo female s/p R TKA. PMH of asthma, COPD, HTN, sleep apnea, DM, GERD, L TSA, L wrist surgeries.  Clinical Impression  Patient alert, up in chair with RN and family at bedside. Eager for PT and to mobilize. Reported at baseline she is ambulatory with a quad cane or SPC, lives alone but has family to help at discharge.  The patient was able to perform several exercises with verbal cues, demonstrated great R knee AROM. Sit <> stand with RW and CGA, cued for hand placement but pt resistant to platform attachment (platform provided due to pt history of L wrist surgeries and to improve comfort). Pt instead held front of RW to take a few steps forwards/backwards, step to gait pattern and antalgic gait noted on RLE. Some unsteadiness noted with backwards steps.  Overall the patient demonstrated deficits (see "PT Problem List") that impede the patient's functional abilities, safety, and mobility and would benefit from skilled PT intervention. Recommendation is HHPT with frequent/constant supervision/assistance to maximize safety, mobility, and function.        Recommendations for follow up therapy are one component of a multi-disciplinary discharge planning process, led by the attending physician.  Recommendations may be updated based on patient status, additional functional criteria and insurance authorization.  Follow Up Recommendations Home health PT      Assistance Recommended at Discharge Frequent or constant Supervision/Assistance  Patient can return home with the following  A little help with walking and/or transfers;A little help with bathing/dressing/bathroom;Assistance with cooking/housework;Assist for transportation;Help with stairs or ramp for entrance;Direct supervision/assist for medications management    Equipment Recommendations  Rolling walker (2 wheels) (anticipate platform attachment as well, TBD)  Recommendations for Other Services       Functional Status Assessment Patient has had a recent decline in their functional status and demonstrates the ability to make significant improvements in function in a reasonable and predictable amount of time.     Precautions / Restrictions Precautions Precautions: Fall;Knee Precaution Booklet Issued: Yes (comment) Restrictions Weight Bearing Restrictions: Yes RLE Weight Bearing: Weight bearing as tolerated      Mobility  Bed Mobility               General bed mobility comments: pt up in chair at start/end of session    Transfers Overall transfer level: Needs assistance Equipment used: Rolling walker (2 wheels) Transfers: Sit to/from Stand Sit to Stand: Min guard           General transfer comment: cued for hand placement, attempted with platform attachment, pt not agreeable to use    Ambulation/Gait Ambulation/Gait assistance: Min guard Gait Distance (Feet): 2 Feet Assistive device: Rolling walker (2 wheels)         General Gait Details: pt places both hands on front of RW, antalgic step to gait pattern for a few feet forwards, few feet backwards, some unsteadiness noted with backwards stepping  Stairs            Wheelchair Mobility    Modified Rankin (Stroke Patients Only)       Balance Overall balance assessment: Needs assistance Sitting-balance support: Feet supported Sitting balance-Leahy Scale: Good     Standing balance support: Reliant on assistive device for balance Standing balance-Leahy Scale: Poor Standing balance comment: improved safety with BUE support  Pertinent Vitals/Pain Pain Assessment Pain Assessment: 0-10 Pain Score: 6  Pain Location: R knee Pain Descriptors / Indicators: Sore, Spasm Pain Intervention(s): Limited activity within patient's tolerance, Monitored  during session, Repositioned, Ice applied    Home Living Family/patient expects to be discharged to:: Private residence Living Arrangements: Alone Available Help at Discharge: Family;Available 24 hours/day (son to assist at baseline) Type of Home: House Home Access: Level entry (small threshold)       Home Layout: One level Home Equipment: Cane - single point;Cane - quad;Grab bars - toilet      Prior Function Prior Level of Function : Independent/Modified Independent                     Hand Dominance        Extremity/Trunk Assessment   Upper Extremity Assessment Upper Extremity Assessment: Defer to OT evaluation (Pt reported limitations in L wrist, inability to lift more than 1lb, wrist fusion)    Lower Extremity Assessment Lower Extremity Assessment:  (able to move both extremities against gravity, s/p R TKA)    Cervical / Trunk Assessment Cervical / Trunk Assessment: Normal  Communication   Communication: No difficulties  Cognition Arousal/Alertness: Awake/alert Behavior During Therapy: WFL for tasks assessed/performed Overall Cognitive Status: Within Functional Limits for tasks assessed                                          General Comments      Exercises Total Joint Exercises Short Arc Quad: AROM, 10 reps, Right Heel Slides: Seated, AROM, Right, 10 reps, Strengthening Long Arc Quad: AROM, Right, 10 reps Knee Flexion: AROM, Right, 10 reps, Seated Goniometric ROM: 0 - 90 degrees   Assessment/Plan    PT Assessment Patient needs continued PT services  PT Problem List Decreased strength;Decreased mobility;Decreased range of motion;Decreased activity tolerance;Decreased balance;Pain;Decreased safety awareness;Decreased knowledge of use of DME       PT Treatment Interventions DME instruction;Therapeutic exercise;Gait training;Balance training;Stair training;Neuromuscular re-education;Functional mobility training;Therapeutic  activities;Patient/family education    PT Goals (Current goals can be found in the Care Plan section)  Acute Rehab PT Goals Patient Stated Goal: to go home PT Goal Formulation: With patient Time For Goal Achievement: 03/28/22 Potential to Achieve Goals: Good    Frequency BID     Co-evaluation               AM-PAC PT "6 Clicks" Mobility  Outcome Measure Help needed turning from your back to your side while in a flat bed without using bedrails?: A Little Help needed moving from lying on your back to sitting on the side of a flat bed without using bedrails?: A Little Help needed moving to and from a bed to a chair (including a wheelchair)?: A Little Help needed standing up from a chair using your arms (e.g., wheelchair or bedside chair)?: A Little Help needed to walk in hospital room?: A Little Help needed climbing 3-5 steps with a railing? : A Lot 6 Click Score: 17    End of Session Equipment Utilized During Treatment: Gait belt Activity Tolerance: Patient tolerated treatment well Patient left: in chair;with chair alarm set;with call bell/phone within reach Nurse Communication: Mobility status PT Visit Diagnosis: Other abnormalities of gait and mobility (R26.89);Difficulty in walking, not elsewhere classified (R26.2);Muscle weakness (generalized) (M62.81);Pain Pain - Right/Left: Right Pain - part of body: Knee  Time: 4174-0814 PT Time Calculation (min) (ACUTE ONLY): 34 min   Charges:   PT Evaluation $PT Eval Low Complexity: 1 Low PT Treatments $Therapeutic Exercise: 8-22 mins $Therapeutic Activity: 8-22 mins       Lieutenant Diego PT, DPT 4:11 PM,03/14/22

## 2022-03-14 NOTE — Anesthesia Preprocedure Evaluation (Signed)
Anesthesia Evaluation  Patient identified by MRN, date of birth, ID band Patient awake    Reviewed: Allergy & Precautions, NPO status , Patient's Chart, lab work & pertinent test results  History of Anesthesia Complications Negative for: history of anesthetic complications  Airway Mallampati: II  TM Distance: >3 FB Neck ROM: Full    Dental  (+) Edentulous Upper, Edentulous Lower   Pulmonary neg shortness of breath, asthma , sleep apnea and Oxygen sleep apnea , COPD,  COPD inhaler, neg recent URI,    Pulmonary exam normal  + decreased breath sounds      Cardiovascular Exercise Tolerance: Poor hypertension, (-) angina(-) Past MI and (-) Cardiac Stents Normal cardiovascular exam+ dysrhythmias (-) Valvular Problems/Murmurs Rhythm:Regular     Neuro/Psych negative neurological ROS  negative psych ROS   GI/Hepatic Neg liver ROS, hiatal hernia, GERD  ,  Endo/Other  diabetesHypothyroidism   Renal/GU negative Renal ROS  negative genitourinary   Musculoskeletal   Abdominal (+) + obese,   Peds negative pediatric ROS (+)  Hematology negative hematology ROS (+)   Anesthesia Other Findings Past Medical History: No date: Asthma No date: COPD (chronic obstructive pulmonary disease) ([place])  Past Surgical History: No date: CATARACT EXTRACTION; Bilateral No date: EYE SURGERY No date: GALLBLADDER SURGERY No date: HUMERUS SURGERY; Left No date: THYROIDECTOMY No date: TONSILECTOMY, ADENOIDECTOMY, BILATERAL MYRINGOTOMY AND TUBES No date: TOTAL SHOULDER REPLACEMENT; Left No date: WRIST SURGERY  BMI    Body Mass Index: 25.80 kg/m      Reproductive/Obstetrics negative OB ROS                             Anesthesia Physical  Anesthesia Plan  ASA: 3  Anesthesia Plan: Spinal   Post-op Pain Management:    Induction:   PONV Risk Score and Plan: Propofol infusion and TIVA  Airway Management Planned:  Natural Airway and Nasal Cannula  Additional Equipment:   Intra-op Plan:   Post-operative Plan:   Informed Consent: I have reviewed the patients History and Physical, chart, labs and discussed the procedure including the risks, benefits and alternatives for the proposed anesthesia with the patient or authorized representative who has indicated his/her understanding and acceptance.     Dental Advisory Given  Plan Discussed with: CRNA and Surgeon  Anesthesia Plan Comments:         Anesthesia Quick Evaluation

## 2022-03-14 NOTE — Anesthesia Procedure Notes (Signed)
Spinal  Patient location during procedure: OR Start time: 03/14/2022 7:47 AM End time: 03/14/2022 7:56 AM Reason for block: surgical anesthesia Staffing Performed: resident/CRNA  Resident/CRNA: Nelda Marseille, CRNA Performed by: Nelda Marseille, CRNA Authorized by: Martha Clan, MD   Preanesthetic Checklist Completed: patient identified, IV checked, site marked, risks and benefits discussed, surgical consent, monitors and equipment checked, pre-op evaluation and timeout performed Spinal Block Patient position: sitting Prep: ChloraPrep Patient monitoring: heart rate, continuous pulse ox, blood pressure and cardiac monitor Approach: midline Location: L3-4 Injection technique: single-shot Needle Needle type: Whitacre and Introducer  Needle gauge: 25 G Needle length: 9 cm Assessment Sensory level: T10 Events: CSF return Additional Notes Sterile aseptic technique used throughout the procedure.  Negative paresthesia. Negative blood return. Positive free-flowing CSF. Expiration date of kit checked and confirmed. Patient tolerated procedure well, without complications.

## 2022-03-14 NOTE — Progress Notes (Signed)
Communicated with Dr. Rosey Bath and Dr. Karel Jarvis. From both a surgical and anesthesia standpoint, the patient is okay to go to the floor. Per Dr. Rosey Bath the pressures are lower due to the spinal anesthetic that can take hours to leave. Patient is asymptomatic and doing well. Patient given ultram and toradol for pain in the surgical knee, notified patient son that she is doing well and going to room 151 on the first floor.

## 2022-03-15 ENCOUNTER — Encounter: Payer: Self-pay | Admitting: Orthopedic Surgery

## 2022-03-15 DIAGNOSIS — M1711 Unilateral primary osteoarthritis, right knee: Secondary | ICD-10-CM | POA: Diagnosis not present

## 2022-03-15 LAB — CBC
HCT: 31.5 % — ABNORMAL LOW (ref 36.0–46.0)
Hemoglobin: 10.6 g/dL — ABNORMAL LOW (ref 12.0–15.0)
MCH: 30.5 pg (ref 26.0–34.0)
MCHC: 33.7 g/dL (ref 30.0–36.0)
MCV: 90.5 fL (ref 80.0–100.0)
Platelets: 119 10*3/uL — ABNORMAL LOW (ref 150–400)
RBC: 3.48 MIL/uL — ABNORMAL LOW (ref 3.87–5.11)
RDW: 12.8 % (ref 11.5–15.5)
WBC: 9.9 10*3/uL (ref 4.0–10.5)
nRBC: 0 % (ref 0.0–0.2)

## 2022-03-15 LAB — COMPREHENSIVE METABOLIC PANEL
ALT: 21 U/L (ref 0–44)
AST: 32 U/L (ref 15–41)
Albumin: 3.3 g/dL — ABNORMAL LOW (ref 3.5–5.0)
Alkaline Phosphatase: 34 U/L — ABNORMAL LOW (ref 38–126)
Anion gap: 10 (ref 5–15)
BUN: 19 mg/dL (ref 8–23)
CO2: 20 mmol/L — ABNORMAL LOW (ref 22–32)
Calcium: 8.6 mg/dL — ABNORMAL LOW (ref 8.9–10.3)
Chloride: 104 mmol/L (ref 98–111)
Creatinine, Ser: 0.82 mg/dL (ref 0.44–1.00)
GFR, Estimated: >60 mL/min (ref >60)
Glucose, Bld: 317 mg/dL — ABNORMAL HIGH (ref 70–99)
Potassium: 5 mmol/L (ref 3.5–5.1)
Sodium: 134 mmol/L — ABNORMAL LOW (ref 135–145)
Total Bilirubin: 0.6 mg/dL (ref 0.3–1.2)
Total Protein: 6.3 g/dL — ABNORMAL LOW (ref 6.5–8.1)

## 2022-03-15 MED ORDER — TRAMADOL HCL 50 MG PO TABS: 50.0000 mg | ORAL_TABLET | Freq: Four times a day (QID) | ORAL | 0 refills | Status: DC | PRN

## 2022-03-15 MED ORDER — FUROSEMIDE 20 MG PO TABS: 20.0000 mg | ORAL_TABLET | Freq: Every day | ORAL | Status: DC

## 2022-03-15 MED ORDER — BENZONATATE 100 MG PO CAPS: 100.0000 mg | ORAL_CAPSULE | Freq: Three times a day (TID) | ORAL | 0 refills | Status: DC | PRN

## 2022-03-15 MED ORDER — CELECOXIB 100 MG PO CAPS: 100.0000 mg | ORAL_CAPSULE | Freq: Two times a day (BID) | ORAL | 0 refills | Status: AC

## 2022-03-15 MED ORDER — CYCLOBENZAPRINE HCL 5 MG PO TABS: 5.0000 mg | ORAL_TABLET | Freq: Three times a day (TID) | ORAL | 0 refills | Status: DC | PRN

## 2022-03-15 MED ORDER — METFORMIN HCL 500 MG PO TABS: 500.0000 mg | ORAL_TABLET | Freq: Every day | ORAL | 0 refills | Status: DC

## 2022-03-15 MED ORDER — ENALAPRIL MALEATE 20 MG PO TABS: 20.0000 mg | ORAL_TABLET | Freq: Every day | ORAL | Status: DC

## 2022-03-15 MED ORDER — LIVING WELL WITH DIABETES BOOK
Freq: Once | Status: AC
  Filled 2022-03-15: qty 1

## 2022-03-15 MED ORDER — METFORMIN HCL 500 MG PO TABS: 500.0000 mg | ORAL_TABLET | Freq: Every day | ORAL | Status: DC

## 2022-03-15 MED ORDER — ENOXAPARIN SODIUM 40 MG/0.4ML IJ SOSY: 40.0000 mg | PREFILLED_SYRINGE | INTRAMUSCULAR | 0 refills | Status: DC

## 2022-03-15 MED ORDER — SODIUM CHLORIDE 0.9 % IV BOLUS
250.0000 mL | Freq: Once | INTRAVENOUS | Status: AC
  Administered 2022-03-15: 250 mL via INTRAVENOUS

## 2022-03-15 MED ORDER — DOCUSATE SODIUM 100 MG PO CAPS: 100.0000 mg | ORAL_CAPSULE | Freq: Two times a day (BID) | ORAL | 0 refills | Status: DC

## 2022-03-15 NOTE — Progress Notes (Signed)
Met with the patient in the room She is [facility] by [facility] will transport home at [place] to bring a Left Platform [facility] will pickup daily for the next 4 weeks for [facility] PT

## 2022-03-15 NOTE — Anesthesia Postprocedure Evaluation (Signed)
Anesthesia Post Note  Patient: [name]  Procedure(s) Performed: TOTAL KNEE ARTHROPLASTY (Right: Knee)  Patient location during evaluation: Nursing Unit Anesthesia Type: Spinal Level of consciousness: awake Pain management: pain level controlled Respiratory status: spontaneous breathing Cardiovascular status: stable Postop Assessment: no headache Anesthetic complications: no   No notable events documented.   Last Vitals:  Vitals:   03/15/22 0002 03/15/22 0526  BP: (!) 96/54 (!) 103/55  Pulse: 81 72  Resp: 16 16  Temp: 36.5 C 36.4 C  SpO2: 96% 95%    Last Pain:  Vitals:   03/14/22 2149  TempSrc:   PainSc: 5                  Lerry Liner

## 2022-03-15 NOTE — Discharge Instructions (Signed)
   Instructions after Total Knee Replacement   Zachary Aberman M.D.     Dept. of Orthopaedics & Sports Medicine  [facility]  [address]  [place], [place]  [address]  Phone: [phone]   Fax: [phone]    DIET: Drink plenty of non-alcoholic fluids. Resume your normal diet. Include foods high in fiber.  ACTIVITY:  You may use crutches or a walker with weight-bearing as tolerated, unless instructed otherwise. You may be weaned off of the walker or crutches by your Physical Therapist.  Do NOT place pillows under the knee. Anything placed under the knee could limit your ability to straighten the knee.   Continue doing gentle exercises. Exercising will reduce the pain and swelling, increase motion, and prevent muscle weakness.   Please continue to use the TED compression stockings for 2 weeks. You may remove the stockings at night, but should reapply them in the morning. Do not drive or operate any equipment until instructed.  WOUND CARE:  Continue to use the PolarCare or ice packs periodically to reduce pain and swelling. You may begin showering 3 days after surgery with honeycomb dressing. Remove honeycomb dressing 7 days after surgery and continue showering. Allow dermabond to fall off on its own.  MEDICATIONS: You may resume your regular medications. Please take the pain medication as prescribed on the medication. Do not take pain medication on an empty stomach. You have been given a prescription for a blood thinner (Lovenox or Coumadin). Please take the medication as instructed. (NOTE: After completing a 2 week course of Lovenox, take one 81 mg Enteric-coated aspirin twice a day. This along with elevation will help reduce the possibility of phlebitis in your operated leg.) Do not drive or drink alcoholic beverages when taking pain medications.  CALL THE OFFICE FOR: Temperature above 101 degrees Excessive bleeding or drainage on the dressing. Excessive swelling,  coldness, or paleness of the toes. Persistent nausea and vomiting.  FOLLOW-UP:  You should have an appointment to return to the office in 14 days after surgery. Arrangements have been made for continuation of Physical Therapy (either home therapy or outpatient therapy).    

## 2022-03-15 NOTE — Plan of Care (Signed)
  Problem: Education: Goal: Knowledge of the prescribed therapeutic regimen will improve Outcome: Progressing   Problem: Activity: Goal: Ability to avoid complications of mobility impairment will improve Outcome: Progressing   Problem: Pain Management: Goal: Pain level will decrease with appropriate interventions Outcome: Progressing   Problem: Skin Integrity: Goal: Will show signs of wound healing Outcome: Progressing   Problem: Education: Goal: Knowledge of General Education information will improve Description: Including pain rating scale, medication(s)/side effects and non-pharmacologic comfort measures Outcome: Progressing   Problem: Activity: Goal: Risk for activity intolerance will decrease Outcome: Progressing   Problem: Pain Managment: Goal: General experience of comfort will improve Outcome: Progressing   Problem: Skin Integrity: Goal: Risk for impaired skin integrity will decrease Outcome: Progressing

## 2022-03-15 NOTE — Progress Notes (Addendum)
Physical Therapy Treatment Patient Details Name: [name] MRN: [id number] DOB: May 05, 1946 Today's Date: 03/15/2022   History of Present Illness Pt is a 76 yo female s/p R TKA. PMH of asthma, COPD, HTN, sleep apnea, DM, GERD, L TSA, L wrist surgeries.    PT Comments    Up in chair.  Pt able to stand to platform RW with min guard.  Cues to push from chair but she does prefer to pull on walker.  She is able to walk to/from rehab gym with min guard and overall does well. Stair training with SPC up/down 4 steps with min guad/ assist and verbal cues for sequencing.  1 curb step with platform walker and min a x 1.  Stretching ex on mat table with excellent ROM.  Pt does have dizzy spells at baseline per pt and son.  She does have several incidents of passing dizziness during session.  She does seem to be well aware and manages well but fall risk remains a concern.  Son will be available to assist at home upon discharge and is educated on safety and expected recovery.   Given HEP handout and reviewed with son.    Recommendations for follow up therapy are one component of a multi-disciplinary discharge planning process, led by the attending physician.  Recommendations may be updated based on patient status, additional functional criteria and insurance authorization.  Follow Up Recommendations  Home health PT     Assistance Recommended at Discharge Frequent or constant Supervision/Assistance  Patient can return home with the following A little help with walking and/or transfers;A little help with bathing/dressing/bathroom;Assistance with cooking/housework;Assist for transportation;Help with stairs or ramp for entrance;Direct supervision/assist for medications management   Equipment Recommendations  Rolling walker (2 wheels);Other (comment) (platform attachment)    Recommendations for Other Services       Precautions / Restrictions Precautions Precautions: Fall;Knee Precaution Booklet  Issued: Yes (comment) Restrictions Weight Bearing Restrictions: Yes RLE Weight Bearing: Weight bearing as tolerated     Mobility  Bed Mobility               General bed mobility comments: pt up in chair at start/end of session    Transfers Overall transfer level: Needs assistance Equipment used: Left platform walker Transfers: Sit to/from Stand Sit to Stand: Supervision           General transfer comment: cues for hand placements and general safety    Ambulation/Gait Ambulation/Gait assistance: Min guard Gait Distance (Feet): 120 Feet Assistive device: Left platform walker Gait Pattern/deviations: Step-through pattern, Decreased step length - right, Decreased step length - left Gait velocity: WFL     General Gait Details: uses platform walker today and overall does well with it.   Stairs             Wheelchair Mobility    Modified Rankin (Stroke Patients Only)       Balance Overall balance assessment: Needs assistance Sitting-balance support: No upper extremity supported, Feet supported Sitting balance-Leahy Scale: Good     Standing balance support: No upper extremity supported, During functional activity Standing balance-Leahy Scale: Fair Standing balance comment: improved safety with BUE support                            Cognition Arousal/Alertness: Awake/alert Behavior During Therapy: WFL for tasks assessed/performed Overall Cognitive Status: Within Functional Limits for tasks assessed  Exercises Total Joint Exercises Long Arc Quad: AROM, Right, 10 reps Knee Flexion: AROM, Right, 10 reps, Seated Goniometric ROM: 0-97    General Comments        Pertinent Vitals/Pain Pain Assessment Pain Assessment: Faces Faces Pain Scale: Hurts a little bit Pain Location: R knee Pain Descriptors / Indicators: Sore, Spasm Pain Intervention(s): Monitored during session,  Repositioned    Home Living Family/patient expects to be discharged to:: Private residence Living Arrangements: Alone Available Help at Discharge: Family;Available 24 hours/day Type of Home: House Home Access: Level entry       Home Layout: One level Home Equipment: Cane - single point;Cane - quad;Grab bars - toilet;Shower seat      Prior Function            PT Goals (current goals can now be found in the care plan section) Progress towards PT goals: Progressing toward goals    Frequency    BID      PT Plan Current plan remains appropriate    Co-evaluation              AM-PAC PT "6 Clicks" Mobility   Outcome Measure  Help needed turning from your back to your side while in a flat bed without using bedrails?: A Little Help needed moving from lying on your back to sitting on the side of a flat bed without using bedrails?: A Little Help needed moving to and from a bed to a chair (including a wheelchair)?: A Little Help needed standing up from a chair using your arms (e.g., wheelchair or bedside chair)?: A Little Help needed to walk in hospital room?: A Little Help needed climbing 3-5 steps with a railing? : A Lot 6 Click Score: 17    End of Session Equipment Utilized During Treatment: Gait belt Activity Tolerance: Patient tolerated treatment well Patient left: in chair;with chair alarm set;with call bell/phone within reach Nurse Communication: Mobility status PT Visit Diagnosis: Other abnormalities of gait and mobility (R26.89);Difficulty in walking, not elsewhere classified (R26.2);Muscle weakness (generalized) (M62.81);Pain Pain - Right/Left: Right Pain - part of body: Knee     Time: 0935-1006 PT Time Calculation (min) (ACUTE ONLY): 31 min  Charges:  $Gait Training: 8-22 mins $Therapeutic Exercise: 8-22 mins                   Chesley Noon, PTA 03/15/22, 10:37 AM

## 2022-03-15 NOTE — Progress Notes (Signed)
Contacted [facility] RN, Juliann Pulse. Notified RN pt has discharged order in place. Reviewed discharge instructions with pt. Pt verbalized understanding. Pt discharged with all personal belongings, polar care, and Iv intact when removed. Staff wheeled pt out. Pt transported to home via [facility] transportation.

## 2022-03-15 NOTE — Evaluation (Signed)
Occupational Therapy Evaluation Patient Details Name: [name] MRN: [id number] DOB: 11-10-1945 Today's Date: 03/15/2022   History of Present Illness Pt is a 76 yo female s/p R TKA. PMH of asthma, COPD, HTN, sleep apnea, DM, GERD, L TSA, L wrist surgeries.   Clinical Impression   [name] was seen for OT evaluation this date. Prior to hospital admission, pt was MOD I for mobility and ADLs using QC. Pt lives alone with family available as needed. Pt presents to acute OT demonstrating impaired ADL performance and functional mobility 2/2 decreased activity tolerance and functional strength/ROM/balance deficits. Pt currently requires SUPERVISION don shorts seated EOB, R rail use for standing. MOD I don/doff B socks, MAX A for compression socks seated EOB. SUPERVISION + L platform RW for toilet t/f. Pt instructed in polar care mgt, falls prevention strategies, and compression stocking mgt. Handout provided. All education complete, will sign off. Upon hospital discharge, recommend no OT follow up needed.    Recommendations for follow up therapy are one component of a multi-disciplinary discharge planning process, led by the attending physician.  Recommendations may be updated based on patient status, additional functional criteria and insurance authorization.   Follow Up Recommendations  No OT follow up    Assistance Recommended at Discharge Intermittent Supervision/Assistance  Patient can return home with the following A little help with bathing/dressing/bathroom;Help with stairs or ramp for entrance    Functional Status Assessment  Patient has had a recent decline in their functional status and demonstrates the ability to make significant improvements in function in a reasonable and predictable amount of time.  Equipment Recommendations  Other (comment) (RW)    Recommendations for Other Services       Precautions / Restrictions Precautions Precautions: Fall;Knee Precaution Booklet  Issued: Yes (comment) Restrictions Weight Bearing Restrictions: Yes RLE Weight Bearing: Weight bearing as tolerated      Mobility Bed Mobility Overal bed mobility: Modified Independent                  Transfers Overall transfer level: Needs assistance Equipment used: Left platform walker Transfers: Sit to/from Stand Sit to Stand: Supervision                  Balance Overall balance assessment: Needs assistance Sitting-balance support: No upper extremity supported, Feet supported Sitting balance-Leahy Scale: Good     Standing balance support: No upper extremity supported, During functional activity Standing balance-Leahy Scale: Fair                             ADL either performed or assessed with clinical judgement   ADL Overall ADL's : Needs assistance/impaired                                       General ADL Comments: SUPERVISION don shorts seated EOB, R rail use for standing. MOD I don/doff B socks, MAX A for compression socks seated EOB. SUPERVISION + L platform RW for toilet t/f      Pertinent Vitals/Pain Pain Assessment Pain Assessment: 0-10 Pain Score: 6  Pain Location: R knee Pain Descriptors / Indicators: Sore, Spasm Pain Intervention(s): Limited activity within patient's tolerance, Premedicated before session, Patient requesting pain meds-RN notified     Hand Dominance Right   Extremity/Trunk Assessment Upper Extremity Assessment Upper Extremity Assessment: LUE deficits/detail;Overall [facility] for tasks assessed LUE  Deficits / Details: chronic L wrist deficits, hx of fusion   Lower Extremity Assessment Lower Extremity Assessment: Defer to PT evaluation       Communication Communication Communication: No difficulties   Cognition Arousal/Alertness: Awake/alert Behavior During Therapy: WFL for tasks assessed/performed Overall Cognitive Status: Within Functional Limits for tasks assessed                                                   Home Living Family/patient expects to be discharged to:: Private residence Living Arrangements: Alone Available Help at Discharge: Family;Available 24 hours/day Type of Home: House Home Access: Level entry     Home Layout: One level     Bathroom Shower/Tub: Teacher, early years/pre: Standard     Home Equipment: Cane - single point;Cane - quad;Grab bars - toilet;Shower seat          Prior Functioning/Environment Prior Level of Function : Independent/Modified Independent                        OT Problem List: Decreased strength;Decreased range of motion;Decreased activity tolerance         OT Goals(Current goals can be found in the care plan section) Acute Rehab OT Goals Patient Stated Goal: to go home OT Goal Formulation: With patient/family Time For Goal Achievement: 03/29/22 Potential to Achieve Goals: Good   AM-PAC OT "6 Clicks" Daily Activity     Outcome Measure Help from another person eating meals?: None Help from another person taking care of personal grooming?: A Little Help from another person toileting, which includes using toliet, bedpan, or urinal?: A Little Help from another person bathing (including washing, rinsing, drying)?: A Little Help from another person to put on and taking off regular upper body clothing?: None Help from another person to put on and taking off regular lower body clothing?: A Little 6 Click Score: 20   End of Session Nurse Communication: Patient requests pain meds  Activity Tolerance: Patient tolerated treatment well Patient left: in chair;with call bell/phone within reach;with nursing/sitter in room;with family/visitor present  OT Visit Diagnosis: Other abnormalities of gait and mobility (R26.89);Muscle weakness (generalized) (M62.81)                Time: 4540-9811 OT Time Calculation (min): 30 min Charges:  OT General Charges $OT Visit: 1 Visit OT  Evaluation $OT Eval Low Complexity: 1 Low OT Treatments $Self Care/Home Management : 8-22 mins  Dessie Coma, M.S. OTR/L  03/15/22, 9:34 AM  ascom [phone]

## 2022-03-15 NOTE — Progress Notes (Signed)
   Subjective: 1 Day Post-Op Procedure(s) (LRB): TOTAL KNEE ARTHROPLASTY (Right) Patient reports pain as mild.   Patient is well, and has had no acute complaints or problems Denies any CP, SOB, ABD pain. We will continue therapy today.  Plan is to go Home after hospital stay.  Objective: Vital signs in last 24 hours: Temp:  [97 F (36.1 C)-97.7 F (36.5 C)] 97.6 F (36.4 C) (10/10 0526) Pulse Rate:  [58-86] 72 (10/10 0526) Resp:  [12-23] 16 (10/10 0526) BP: (71-112)/(44-61) 103/55 (10/10 0526) SpO2:  [95 %-100 %] 95 % (10/10 0526)  Intake/Output from previous day: 10/09 0701 - 10/10 0700 In: 2510 [I.V.:1900.1; IV Piggyback:609.9] Out: 675 [Urine:650; Blood:25] Intake/Output this shift: No intake/output data recorded.  Recent Labs    03/15/22 0358  HGB 10.6*   Recent Labs    03/15/22 0358  WBC 9.9  RBC 3.48*  HCT 31.5*  PLT 119*   Recent Labs    03/15/22 0358  NA 134*  K 5.0  CL 104  CO2 20*  BUN 19  CREATININE 0.82  GLUCOSE 317*  CALCIUM 8.6*   No results for input(s): "LABPT", "INR" in the last 72 hours.  EXAM General - Patient is Alert, Appropriate, and Oriented Extremity - Neurovascular intact Sensation intact distally Intact pulses distally Dorsiflexion/Plantar flexion intact No cellulitis present Compartment soft Dressing - dressing C/D/I and no drainage Motor Function - intact, moving foot and toes well on exam.   Past Medical History:  Diagnosis Date   Anemia    Asthma    COPD (chronic obstructive pulmonary disease) (HCC)    Diabetes mellitus without complication (HCC)    type 2   DVT (deep venous thrombosis) (HCC) 1980   GERD (gastroesophageal reflux disease)    History of hiatal hernia    Hypertension    Hypothyroidism     Assessment/Plan:   1 Day Post-Op Procedure(s) (LRB): TOTAL KNEE ARTHROPLASTY (Right) Principal Problem:   Osteoarthritis of right knee  Estimated body mass index is 28.72 kg/m as calculated from the  following:   Height as of this encounter: 5\' 1"  (1.549 m).   Weight as of this encounter: 68.9 kg. Advance diet Up with therapy Pain controlled VSS, BP soft. Hold enalapril and lasix this am. Bolus of NS 250 cc ordered Labs stable CM to assist with discharge to home with HHPT. Possible dc to home today pending completion of PT goals.  DVT Prophylaxis - Lovenox, TED hose, and SCDs Weight-Bearing as tolerated to right leg   T. Rachelle Hora, PA-C [place] 03/15/2022, 8:07 AM

## 2022-03-15 NOTE — Discharge Summary (Signed)
Physician Discharge Summary  Patient ID: [name] MRN: [id number] DOB/AGE: 77/07/1945 77 y.o.  Admit date: 03/14/2022 Discharge date: 03/15/2022  Admission Diagnoses:  Osteoarthritis of right knee [M17.11]   Discharge Diagnoses: Patient Active Problem List   Diagnosis Date Noted   Osteoarthritis of right knee 03/14/2022    Past Medical History:  Diagnosis Date   Anemia    Asthma    COPD (chronic obstructive pulmonary disease) ([place])    Diabetes mellitus without complication ([place])    type 2   DVT (deep venous thrombosis) ([place]) 1980   GERD (gastroesophageal reflux disease)    History of hiatal hernia    Hypertension    Hypothyroidism      Transfusion: none   Consultants (if any):   Discharged Condition: Improved  Hospital Course: [name] is an 76 y.o. female who was admitted 03/14/2022 with a diagnosis of Osteoarthritis of right knee and went to the operating room on 03/14/2022 and underwent the above named procedures.    Surgeries: Procedure(s): TOTAL KNEE ARTHROPLASTY on 03/14/2022 Patient tolerated the surgery well. Taken to PACU where she was stabilized and then transferred to the orthopedic floor.  Started on Lovenox 30 mg q 12 hrs. TEDs and SCDs applied bilaterally. Heels elevated on bed. No evidence of DVT. Negative [place]. Physical therapy started on day #1 for gait training and transfer. OT started day #1 for ADL and assisted devices.  On postop day 1 patient was consulted with inpatient diabetes program coordinator, patient agreed to start checking her glucose and is open to oral diabetes medications.  Patient was set up with a glucometer at discharge and started on metformin 500 mg daily.  Patient's blood pressure was running a little soft on postop day 1, she was asymptomatic.  Her blood pressure medications were held and she was given 250 cc bolus.  Blood pressure improved and patient was able to perform physical therapy task very well and safely.  She  was stable and ready for discharge to home with home health PT. Patient's IV was discharged on postop day 1. On post op day #1 patient was stable and ready for discharge to home with HHPT.  Implants: Femur: Persona Size 7 Right   Tibia Persona Size D w/ Stubby 14*30 Right  Poly 64mm MC  Patella 76mmx8    She was given perioperative antibiotics:  Anti-infectives (From admission, onward)    Start     Dose/Rate Route Frequency Ordered Stop   03/14/22 1530  ceFAZolin (ANCEF) IVPB 2g/100 mL premix        2 g 200 mL/hr over 30 Minutes Intravenous Every 6 hours 03/14/22 1433 03/14/22 2151   03/14/22 0620  ceFAZolin (ANCEF) 2-4 GM/100ML-% IVPB       Note to Pharmacy: Herby Abraham W: cabinet override      03/14/22 0620 03/14/22 0823   03/14/22 0600  ceFAZolin (ANCEF) IVPB 2g/100 mL premix        2 g 200 mL/hr over 30 Minutes Intravenous On call to O.R. 03/14/22 0030 03/14/22 0825     .  She was given sequential compression devices, early ambulation, and Lovenox TEDs for DVT prophylaxis.  She benefited maximally from the hospital stay and there were no complications.    Recent vital signs:  Vitals:   03/15/22 0526 03/15/22 0846  BP: (!) 103/55 (!) 121/58  Pulse: 72 77  Resp: 16 20  Temp: 97.6 F (36.4 C) 98.8 F (37.1 C)  SpO2: 95% 99%  Recent laboratory studies:  Lab Results  Component Value Date   HGB 10.6 (L) 03/15/2022   HGB 13.7 03/02/2022   HGB 13.1 12/31/2021   Lab Results  Component Value Date   WBC 9.9 03/15/2022   PLT 119 (L) 03/15/2022   No results found for: "INR" Lab Results  Component Value Date   NA 134 (L) 03/15/2022   K 5.0 03/15/2022   CL 104 03/15/2022   CO2 20 (L) 03/15/2022   BUN 19 03/15/2022   CREATININE 0.82 03/15/2022   GLUCOSE 317 (H) 03/15/2022    Discharge Medications:   Allergies as of 03/15/2022       Reactions   Cephalosporins Itching   Codeine Other (See Comments)   Extreme drowsiness   Contrast Media [iodinated Contrast  Media] Hives, Swelling   Facial swelling   Oxycodone Other (See Comments)   Extreme drowsiness        Medication List     STOP taking these medications    ibuprofen 800 MG tablet Commonly known as: ADVIL       TAKE these medications    albuterol (2.5 MG/3ML) 0.083% nebulizer solution Commonly known as: PROVENTIL Take 2.5 mg by nebulization every 4 (four) hours as needed for wheezing or shortness of breath.   albuterol 108 (90 Base) MCG/ACT inhaler Commonly known as: VENTOLIN HFA Inhale 2-4 puffs by mouth every 4 hours as needed for wheezing, cough, and/or shortness of breath   benzonatate 100 MG capsule Commonly known as: Tessalon Perles Take 1 capsule (100 mg total) by mouth 3 (three) times daily as needed for cough.   busPIRone 5 MG tablet Commonly known as: BUSPAR Take 5 mg by mouth 3 (three) times daily as needed.   celecoxib 100 MG capsule Commonly known as: CeleBREX Take 1 capsule (100 mg total) by mouth 2 (two) times daily for 14 days.   cyclobenzaprine 5 MG tablet Commonly known as: FLEXERIL Take 1 tablet (5 mg total) by mouth 3 (three) times daily as needed for muscle spasms.   docusate sodium 100 MG capsule Commonly known as: COLACE Take 1 capsule (100 mg total) by mouth 2 (two) times daily.   enalapril 20 MG tablet Commonly known as: VASOTEC Take 20 mg by mouth daily.   enoxaparin 40 MG/0.4ML injection Commonly known as: LOVENOX Inject 0.4 mLs (40 mg total) into the skin daily for 14 days.   eucerin lotion Apply 1 Application topically as needed for dry skin.   ferrous sulfate 325 (65 FE) MG tablet Take 325 mg by mouth daily.   fluticasone 50 MCG/ACT nasal spray Commonly known as: FLONASE Place 2 sprays into both nostrils daily as needed for allergies or rhinitis.   furosemide 20 MG tablet Commonly known as: LASIX Take 20 mg by mouth daily.   hydrocortisone cream 1 % Apply 1 Application topically 2 (two) times daily as needed for  itching.   ketoconazole 2 % shampoo Commonly known as: NIZORAL Apply 1 Application topically 2 (two) times a week.   ketotifen 0.025 % ophthalmic solution Commonly known as: ZADITOR Place 1 drop into both eyes daily as needed.   levothyroxine 112 MCG tablet Commonly known as: SYNTHROID Take 112 mcg by mouth daily before breakfast.   Magnesium 250 MG Tabs Take 1 tablet by mouth daily.   metFORMIN 500 MG tablet Commonly known as: GLUCOPHAGE Take 1 tablet (500 mg total) by mouth daily with breakfast. Start taking on: March 16, 2022   Omega-3 1000 MG Caps  Take 1 capsule by mouth daily.   omeprazole 20 MG capsule Commonly known as: PRILOSEC Take 20 mg by mouth daily.   OXYGEN Inhale 2 L into the lungs at bedtime.   potassium chloride 10 MEQ tablet Commonly known as: KLOR-CON M Take 10 mEq by mouth daily.   Refresh Optive Advanced 0.5-1-0.5 % Soln Generic drug: Carboxymeth-Glycerin-Polysorb Apply 2 drops to eye daily.   traMADol 50 MG tablet Commonly known as: ULTRAM Take 1 tablet (50 mg total) by mouth every 6 (six) hours as needed for moderate pain.   TURMERIC PO Take 1 capsule by mouth daily.   Tylenol 8 Hour 650 MG CR tablet Generic drug: acetaminophen Take 650 mg by mouth every 8 (eight) hours as needed for pain.   VITAMIN C CR 1500 MG Tbcr Take 1 tablet by mouth daily.   VITAMIN D-VITAMIN K PO Take 1 tablet by mouth daily.               Durable Medical Equipment  (From admission, onward)           Start     Ordered   03/15/22 1220  DME Glucometer  Once        03/15/22 1219   03/15/22 0923  For home use only DME Walker rolling  Once       Comments: L platform attachment  Question Answer Comment  Walker: With 5 Inch Wheels   Patient needs a walker to treat with the following condition Generalized weakness      03/15/22 0922            Diagnostic Studies: DG Knee 1-2 Views Right  Result Date: 03/14/2022 CLINICAL DATA:  Status  post right knee arthroplasty. EXAM: RIGHT KNEE - 1-2 VIEW COMPARISON:  None Available. FINDINGS: Status post right knee arthroplasty. No perihardware lucency is seen to indicate hardware failure or loosening. Expected postoperative changes including intra-articular air, small joint effusion, and mild anterior soft tissue swelling/subcutaneous air. No acute fracture or dislocation. IMPRESSION: Status post right knee arthroplasty without evidence of hardware failure. Electronically Signed   By: Yvonne Kendall M.D.   On: 03/14/2022 11:44   US Venous Img Lower Unilateral Right (DVT)  Result Date: 02/25/2022 CLINICAL DATA:  eval: DVT EXAM: RIGHT LOWER EXTREMITY VENOUS DOPPLER ULTRASOUND TECHNIQUE: Gray-scale sonography with graded compression, as well as color Doppler and duplex ultrasound were performed to evaluate the lower extremity deep venous systems from the level of the common femoral vein and including the common femoral, femoral, profunda femoral, popliteal and calf veins including the posterior tibial, peroneal and gastrocnemius veins when visible. The superficial great saphenous vein was also interrogated. Spectral Doppler was utilized to evaluate flow at rest and with distal augmentation maneuvers in the common femoral, femoral and popliteal veins. COMPARISON:  None Available. FINDINGS: Contralateral Common Femoral Vein: Respiratory phasicity is normal and symmetric with the symptomatic side. No evidence of thrombus. Normal compressibility. Common Femoral Vein: No evidence of thrombus. Normal compressibility, respiratory phasicity and response to augmentation. Saphenofemoral Junction: No evidence of thrombus. Normal compressibility and flow on color Doppler imaging. Profunda Femoral Vein: No evidence of thrombus. Normal compressibility and flow on color Doppler imaging. Femoral Vein: No evidence of thrombus. Normal compressibility, respiratory phasicity and response to augmentation. Popliteal Vein: No  evidence of thrombus. Normal compressibility, respiratory phasicity and response to augmentation. Calf Veins: No evidence of thrombus. Normal compressibility and flow on color Doppler imaging. Other Findings:  None. IMPRESSION: No evidence of deep  venous thrombosis. Electronically Signed   By: Albin Felling M.D.   On: 02/25/2022 10:08    Disposition:      Follow-up Information     Duanne Guess, PA-C Follow up in 2 week(s).   Specialties: Orthopedic Surgery, Emergency Medicine Contact information: [place] [address] [phone]                  Signed: Feliberto Gottron 03/15/2022, 12:38 PM

## 2022-03-15 NOTE — Inpatient Diabetes Management (Addendum)
Inpatient Diabetes Program Recommendations  AACE/ADA: New Consensus Statement on Inpatient Glycemic Control (2015)  Target Ranges:  Prepandial:   less than 140 mg/dL      Peak postprandial:   less than 180 mg/dL (1-2 hours)      Critically ill patients:  140 - 180 mg/dL   Lab Results  Component Value Date   GLUCAP 178 (H) 03/14/2022    Review of Glycemic Control  Latest Reference Range & Units 03/14/22 06:32 03/14/22 10:19  Glucose-Capillary 70 - 99 mg/dL 305 (H) 178 (H)  (H): Data is abnormally high  Diabetes history: DM2 Outpatient Diabetes medications: None Current orders for Inpatient glycemic control: none  Inpatient Diabetes Program Recommendations:    Obtain A1C  Novolog 0-9 units tid and 0-5 qhs  Spoke with patient and son at bedside after noticing her hyperglycemia.  Asked if she had ever been told she has diabetes.  She states, "Oh I know I have diabetes.  I ain't taking any of that medicine because it will lead me to have to take insulin and I am not doing that.  I will get my sugar under control.  Its normally just fine".  Asked if she checks her blood glucose at home and she says no.  Asked how she knows "it's just fine" and states, "I know it's fine because I feel fine".    Discussed diet and exercise along with long and short term complications of diabetes.  Ordered LWWD.  Encouraged her to follow up with PCP and at least strt checking her blood glucose at home.    Addendum@11 :42: RN notified me that her son spoke with her about DM management and she has agreed to start checking glucose and is now open to oral DM meds.  Please order Glucometer at [facility] Order #[id number].  Might also start on Metformin and follow up with PCP.    Will continue to follow while inpatient.  Thank you, Reche Dixon, MSN, [facility] Diabetes Coordinator Inpatient Diabetes Program [phone] (team pager from 8a-5p)

## 2022-03-21 ENCOUNTER — Ambulatory Visit: Admit: 2022-03-21 | Discharge: 2022-03-21 | Payer: MEDICARE | Attending: Sports Medicine

## 2022-03-21 DIAGNOSIS — M25511 Pain in right shoulder: Secondary | ICD-10-CM

## 2022-03-21 MED ORDER — celecoxib (CELEBREX) 200 MG capsule
200 | ORAL_CAPSULE | Freq: Two times a day (BID) | ORAL | 0 refills | Status: AC
Start: 2022-03-21 — End: ?

## 2022-03-21 NOTE — Unmapped (Signed)
[facility]    PATIENT NAME:      [name], [name]                 MRN:                 [id number]  DATE OF BIRTH:     1945-10-17                    CSN:                 [id number]  PROVIDER:          Andree Elk, MD               VISIT DATE:          03/21/2022                                   OFFICE NOTE      She is second postop right shoulder.  She is really down in the dumps.  She reports that the balloon arthroplasty has not helped her at all.  She is pretty upset with how it is going.  I told her it has only been 4 weeks.  She has great forward flexion.    She has pretty good strength and she is still very active with playing her musical instrument.  I counseled her to be optimistic and to continue working with physical therapy.  We also added in some Celebrex to help with inflammatory control.  I will   see her back in 4 to 6 weeks.        Andree Elk, MD      BG/AQ  DD: 03/21/2022 17:17:22  DT: 03/22/2022 01:19:32    JOB#:  [id number]

## 2022-04-11 ENCOUNTER — Ambulatory Visit: Admit: 2022-04-11 | Discharge: 2022-04-15 | Payer: MEDICARE | Attending: "Endocrinology

## 2022-04-11 ENCOUNTER — Ambulatory Visit: Payer: MEDICARE | Attending: Sports Medicine

## 2022-04-11 ENCOUNTER — Other Ambulatory Visit: Admit: 2022-04-11 | Payer: MEDICARE

## 2022-04-11 DIAGNOSIS — E871 Hypo-osmolality and hyponatremia: Secondary | ICD-10-CM

## 2022-04-11 LAB — RENAL FUNCTION PANEL W/EGFR
Albumin: 4.2 g/dL (ref 3.5–5.7)
Anion Gap: 8 mmol/L (ref 3–16)
BUN: 14 mg/dL (ref 7–25)
CO2: 30 mmol/L (ref 21–33)
Calcium: 9.3 mg/dL (ref 8.6–10.3)
Chloride: 94 mmol/L (ref 98–110)
Creatinine: 0.76 mg/dL (ref 0.60–1.30)
EGFR: 81
Glucose: 83 mg/dL (ref 70–100)
Osmolality, Calculated: 274 mOsm/kg (ref 278–305)
Phosphorus: 4.3 mg/dL (ref 2.1–4.5)
Potassium: 4.8 mmol/L (ref 3.5–5.3)
Sodium: 132 mmol/L (ref 133–146)

## 2022-04-11 LAB — SODIUM, URINE, RANDOM: Sodium, Ur: 77 mmol/L

## 2022-04-11 LAB — OSMOLALITY, URINE: Osmolality, Ur: 450 mOsm/kg (ref 50–1200)

## 2022-04-11 NOTE — Unmapped (Signed)
04/11/2022  Reason for follow up: Hyponatremia  HPI:  [name] is a 76 y.o. female with osteoporosis and hyponatremia   Previous history:  Mild hypoglycemia since 09/2021.  Sodium 130-132.  Normal sodium level in 2021  AM cortisol 18. No diabetes. TSH 3.92 in 10/2021     Urine osmolality 440 urine sodium 62 serum sodium 131 and calculated osmolality 273  Denied swelling in right leg. Had swelling in left leg since left total knee replacement surgery. .   No tobaco, drinks half glass of wine 6 days per week. No drugs.   Husband passed away Apr 11, 2020. Oral intake has reduced since then. Cereal for breakfast, half sandwitches with peanut butter for lunch but she does not eat lunches every day. Dinner - eats healthy .  She is thinking about getting a colonoscopy for diarrhea  No cough    01/2022. Sodium 133, low normal. Potassium 4.3, TSH 2.91, FT4 0.85, cortisol 17.1, urine osmolality 468. Sodium 95  Interval history:  Had shoulder surgery recently. Feeling better lately, her appetite has improved and ~3 to 4 lb body weight.  Still has some GI issue - chronic diarrhea?        Allergies   Allergen Reactions    Iodine Anaphylaxis, Hives, Shortness Of Breath, Other (See Comments) and Itching     IVP dye for a kidney function test greater than 20 years ago    Iodine And Iodide Containing Products Anaphylaxis     Respiratory Distress  IVP dye for a kidney function test greater than 20 years ago  Respiratory Distress    Iodinated Contrast Media      Shortness of breath, hives.    Shellfish Containing Products      Advised to avoid shellfish due to allergy to iodine.       Current Outpatient Medications   Medication Sig Dispense Refill    acetaminophen (TYLENOL) 500 MG tablet Take 2 tablets (1,000 mg total) by mouth every 8 hours. 60 tablet 1    alendronate (FOSAMAX) 35 MG tablet Take 1 tablet (35 mg total) by mouth every 7 days.      calcium carbonate (OS-CAL) 500 mg calcium (1,250 mg) chewable tablet Chew 1 tablet (1,250 mg  total) by mouth daily.      celecoxib (CELEBREX) 200 MG capsule Take 1 capsule (200 mg total) by mouth 2 times a day. 60 capsule 0    ibuprofen (MOTRIN) 600 MG tablet Take 1 tablet (600 mg total) by mouth every 8 hours. 60 tablet 1    lactobacillus rhamnosus, GG, (CULTURELLE) 10 billion cell capsule Take 1 capsule by mouth daily.      senna-docusate (SENNOSIDES-DOCUSATE SODIUM) 8.6-50 mg per tablet Take 1 tablet by mouth every 12 hours as needed for Constipation. 60 tablet 0     Current Facility-Administered Medications   Medication Dose Route Frequency Provider Last Rate Last Admin    bupivacaine (MARCAINE) 0.5 % (5 mg/mL) injection 10 mg  2 mL Subcutaneous Once [employer], PA        lidocaine 10 mg/mL (1 %) injection 2 mL  2 mL Subcutaneous Once Marissa Lindsey Baum, [place]        triamcinolone acetonide (KENALOG-40) injection 80 mg  80 mg Intra-articular Once Marissa Cherre Blanc, [place]             -- History -    Patient Active Problem List   Diagnosis    Thoracic or lumbosacral neuritis or radiculitis, unspecified  Degeneration of lumbar or lumbosacral intervertebral disc    Osteoporosis    Routine health maintenance    Lumbar radiculopathy    Status post left knee replacement    Diarrhea of presumed infectious origin    Pre-op exam       Past Surgical History:   Procedure Laterality Date    ARTHROSCOPY SHOULDER Right 02/17/2022    Procedure: Right Shoulder Arthroscopic Balloon Arthroplasty;  Surgeon: Andree Elk, MD;  Location: [facility];  Service: Orthopedics;  Laterality: Right;    CESAREAN SECTION  1985    HARDWARE REMOVAL  8/09    of left tibia plateau    LEG SURGERY  05/09/06    fractured left tibia plateau    RELEASE CARPAL TUNNEL Left 03/22/2016    Procedure: LEFT CARPAL TUNNEL RELEASE;  Surgeon: Kathrene Bongo, MD;  Location: [facility];  Service: Orthopedics;  Laterality: Left;    TOTAL KNEE ARTHROPLASTY Left 11/09/2021    Procedure: LEFT TOTAL KNEE ARTHROPLASTY;  Surgeon: Skipper Cliche, MD;   Location: [facility] OR;  Service: Orthopedics;  Laterality: Left;    WRIST FRACTURE SURGERY Left 11/13/2015    Procedure: OPEN REDUCTION INTERNAL FIXATION LEFT DISTAL RADIUS FRACTURE;  Surgeon: Kathrene Bongo, MD;  Location: [facility];  Service: Orthopedics;  Laterality: Left;       Family History   Problem Relation Age of Onset    Stroke Mother     Melanoma Neg Hx        Social History     Socioeconomic History    Marital status: Widowed     Spouse name: Not on file    Number of children: Not on file    Years of education: Not on file    Highest education level: Not on file   Occupational History    Not on file   Tobacco Use    Smoking status: Never    Smokeless tobacco: Never   Vaping Use    Vaping Use: Never used   Substance and Sexual Activity    Alcohol use: Yes     Alcohol/week: 4.0 standard drinks     Types: 4 Glasses of wine per week    Drug use: No     Comment: 12-23-2009    Sexual activity: Not Currently     Partners: Male   Other Topics Concern    Caffeine Use Not Asked    Occupational Exposure Not Asked    Exercise Not Asked    Seat Belt Not Asked   Social History Narrative    Not on file     Social Determinants of Health     Financial Resource Strain: Not on file   Food Insecurity: Not on file   Physical Activity: Not on file   Stress: Not on file   Social Connections: Not on file   Housing Stability: Not on file       Review of Systems  ROS       No data to display                The following portions of the patient's history were reviewed and updated as appropriate: allergies, current medications, past family history, past medical history, past social history, past surgical history and problem list.    There were no vitals taken for this visit.  Wt Readings from Last 3 Encounters:   03/21/22 (!) 93 lb (42.2 kg)   02/28/22 (!) 93 lb (42.2 kg)   02/15/22 Marland Kitchen)  93 lb (42.2 kg)       Physical Exam  Physical Exam  Vitals signs reviewed.   Constitutional:    No acute distress. Normal appearance.    HENT:   Normocephalic  and atraumatic.   Eyes:   No discharge.  Extraocular movements intact.   Neck:   Normal range of motion.   Pulmonary:   Pulmonary effort is normal. No respiratory distress.   Neurological:   Alert and oriented   Psychiatric:      Mood normal.  Behavior normal.  Thought content normal.  Judgment normal.   Labs:  ~Labs in Care Everywhere reviewed~    Lab Results   Component Value Date    WBC 5.8 10/26/2021    HGB 11.0 (L) 11/10/2021    HCT 31.1 (L) 11/10/2021    MCV 97.3 11/10/2021    PLT 198 10/26/2021     Lab Results   Component Value Date    NEUTROABS 4,095 10/26/2021    LYMPHOPCT 20.1 10/26/2021    MONOPCT 7.3 10/26/2021    MONOSABS 423 10/26/2021    EOSPCT 1.5 10/26/2021    EOSABS 87 10/26/2021    BASOPCT 0.5 10/26/2021     Lab Results   Component Value Date    CREATININE 0.70 01/24/2022    BUN 13 01/24/2022    NA 133 01/24/2022    K 4.3 01/24/2022    CL 97 (L) 01/24/2022    CO2 30 01/24/2022     Lab Results   Component Value Date    ALKPHOS 96 01/24/2022    ALT 9 01/24/2022    AST 24 12/30/2009    AST 24 12/30/2009    BILITOT 0.5 01/24/2022    ALBUMIN 4.3 01/24/2022    PROT 6.5 01/24/2022    LABGLOB 2.5 12/30/2009    LABGLOB 2.5 12/30/2009     Lab Results   Component Value Date    CHOLTOT 208 (H) 09/27/2021    TRIG 98 09/27/2021    HDL 74 09/27/2021    CHOLHDL 3.2 12/30/2009    CHOLHDL 3.2 12/30/2009    CHOLHDL 3.2 12/30/2009    LDL 956 09/27/2021     Lab Results   Component Value Date    HGBA1C 4.8 10/26/2021    HGBA1C 5.2 09/27/2021    HGBA1C 5.6 06/25/2019     No results found for: FRUCT  Lab Results   Component Value Date    TSH 2.91 01/24/2022    FREET4 0.85 01/24/2022     Assessment and Plan:  CHIANTE PEDEN is a 76 y.o. female with osteoporosis and hyponatremia     Hyponatremia  Last sodium level improved to 133. Underlying etiology is unclear but could be SIADH or very low solute intake. Her appetite has improved since last appointment and she has been eating more lately.  Normal thyroid function and  normal AM cortisol.   She appears to be euthyroid clinically today.    - Check renal panel, urine sodium, and urine osmolality     Follow up: we will decide if Endocrine follow up is needed based on above test results.  Eldridge Scot, MD

## 2022-04-18 MED ORDER — celecoxib (CELEBREX) 200 MG capsule
200 | ORAL_CAPSULE | Freq: Two times a day (BID) | ORAL | 0 refills | Status: AC
Start: 2022-04-18 — End: ?

## 2022-05-02 ENCOUNTER — Ambulatory Visit: Admit: 2022-05-02 | Discharge: 2022-05-02 | Payer: MEDICARE | Attending: Sports Medicine

## 2022-05-02 DIAGNOSIS — M25511 Pain in right shoulder: Secondary | ICD-10-CM

## 2022-05-02 NOTE — Unmapped (Signed)
Orthopaedic Surgery Office Note    Subjective:  [name] is a 76 y.o. female who is presenting today for follow-up of her right shoulder arthroscopic balloon arthroplasty on 02/17/2022.  She continues to be disappointed with her progress and continued pain.  She has been active this fall she reports working on the yard and continuing to practice her instrument.  She is still not interested in any type of replacement given her recovery of her left knee replacement was challenging.    ROS: All others negative except for as stated in HPI    The following portions of the patient's history were reviewed and updated as appropriate: allergies, current medications, past medical history, past surgical history, and past social history.    Objective:  Vitals:    05/02/22 1005   Weight: (!) 97 lb (44 kg)   Height: 5' 4 (1.626 m)       Physical Exam:  General: NAD  Cardiopulmonary: unlabored respirations  MSK:      Right upper Extremity   - SILT m/u/r/a  - AIN/PIN/IO intact  - Cap refill < 2 sec  -Shoulder examination: Patient able to actively forward flex her shoulder to approximately 140 degrees, she demonstrates active external rotation and internal rotation.  Positive Hawkins      Imaging:  No new imaging taken at this visit    Assessment:  [name] is a 76 y.o. female approximately 12 weeks status post a right below knee arthroplasty     Plan:  - Discussed with the patient expectations regarding her surgery and she may expect to continue to make marginal improvements for the next few months or so.   - Could consider an MRI at her next visit if she is still frustrated with her progress but she would like to wait till after the holidays before making any decisions.  - Answered all of the patient's questions to his/her satisfaction and understanding, and he/she is in agreement with the plan.     The patient was seen and assessed by both myself and Dr. Kem Kays.  Dr. Kem Kays was present for all procedures and clinical  decision making.    Crissie Sickles, MD  Orthopaedic Surgery Resident

## 2022-05-22 ENCOUNTER — Inpatient Hospital Stay
Admission: EM | Admit: 2022-05-22 | Discharge: 2022-05-24 | DRG: 310 | Disposition: A | Discharge status: Home / Self Care | Payer: Medicare (Managed Care) | Attending: Internal Medicine | Admitting: Internal Medicine

## 2022-05-22 ENCOUNTER — Emergency Department: Payer: Medicare (Managed Care)

## 2022-05-22 ENCOUNTER — Encounter: Payer: Self-pay | Admitting: Internal Medicine

## 2022-05-22 ENCOUNTER — Inpatient Hospital Stay: Payer: Medicare (Managed Care)

## 2022-05-22 ENCOUNTER — Observation Stay: Payer: Medicare (Managed Care)

## 2022-05-22 ENCOUNTER — Other Ambulatory Visit: Payer: Self-pay

## 2022-05-22 DIAGNOSIS — Z86718 Personal history of other venous thrombosis and embolism: Secondary | ICD-10-CM | POA: Diagnosis not present

## 2022-05-22 DIAGNOSIS — K5900 Constipation, unspecified: Secondary | ICD-10-CM | POA: Diagnosis present

## 2022-05-22 DIAGNOSIS — Z885 Allergy status to narcotic agent status: Secondary | ICD-10-CM | POA: Diagnosis not present

## 2022-05-22 DIAGNOSIS — Z79899 Other long term (current) drug therapy: Secondary | ICD-10-CM | POA: Diagnosis not present

## 2022-05-22 DIAGNOSIS — Z96651 Presence of right artificial knee joint: Secondary | ICD-10-CM | POA: Diagnosis present

## 2022-05-22 DIAGNOSIS — Z96612 Presence of left artificial shoulder joint: Secondary | ICD-10-CM | POA: Diagnosis present

## 2022-05-22 DIAGNOSIS — Z7984 Long term (current) use of oral hypoglycemic drugs: Secondary | ICD-10-CM | POA: Diagnosis not present

## 2022-05-22 DIAGNOSIS — J019 Acute sinusitis, unspecified: Secondary | ICD-10-CM | POA: Diagnosis present

## 2022-05-22 DIAGNOSIS — Z91041 Radiographic dye allergy status: Secondary | ICD-10-CM | POA: Diagnosis not present

## 2022-05-22 DIAGNOSIS — E890 Postprocedural hypothyroidism: Secondary | ICD-10-CM | POA: Diagnosis present

## 2022-05-22 DIAGNOSIS — I10 Essential (primary) hypertension: Secondary | ICD-10-CM | POA: Diagnosis not present

## 2022-05-22 DIAGNOSIS — I480 Paroxysmal atrial fibrillation: Principal | ICD-10-CM | POA: Diagnosis present

## 2022-05-22 DIAGNOSIS — D509 Iron deficiency anemia, unspecified: Secondary | ICD-10-CM | POA: Diagnosis not present

## 2022-05-22 DIAGNOSIS — K219 Gastro-esophageal reflux disease without esophagitis: Secondary | ICD-10-CM | POA: Diagnosis present

## 2022-05-22 DIAGNOSIS — E039 Hypothyroidism, unspecified: Secondary | ICD-10-CM | POA: Diagnosis not present

## 2022-05-22 DIAGNOSIS — Z9981 Dependence on supplemental oxygen: Secondary | ICD-10-CM

## 2022-05-22 DIAGNOSIS — I4891 Unspecified atrial fibrillation: Secondary | ICD-10-CM | POA: Diagnosis not present

## 2022-05-22 DIAGNOSIS — Z881 Allergy status to other antibiotic agents status: Secondary | ICD-10-CM | POA: Diagnosis not present

## 2022-05-22 DIAGNOSIS — J449 Chronic obstructive pulmonary disease, unspecified: Secondary | ICD-10-CM | POA: Diagnosis present

## 2022-05-22 DIAGNOSIS — Z79890 Hormone replacement therapy (postmenopausal): Secondary | ICD-10-CM | POA: Diagnosis not present

## 2022-05-22 DIAGNOSIS — E1165 Type 2 diabetes mellitus with hyperglycemia: Secondary | ICD-10-CM | POA: Diagnosis present

## 2022-05-22 DIAGNOSIS — R079 Chest pain, unspecified: Secondary | ICD-10-CM | POA: Diagnosis present

## 2022-05-22 DIAGNOSIS — E119 Type 2 diabetes mellitus without complications: Secondary | ICD-10-CM

## 2022-05-22 DIAGNOSIS — I4892 Unspecified atrial flutter: Secondary | ICD-10-CM | POA: Diagnosis present

## 2022-05-22 DIAGNOSIS — J441 Chronic obstructive pulmonary disease with (acute) exacerbation: Secondary | ICD-10-CM | POA: Insufficient documentation

## 2022-05-22 LAB — CBG MONITORING, ED
Glucose-Capillary: 283 mg/dL — ABNORMAL HIGH (ref 70–99)
Glucose-Capillary: 333 mg/dL — ABNORMAL HIGH (ref 70–99)

## 2022-05-22 LAB — HEPARIN LEVEL (UNFRACTIONATED): Heparin Unfractionated: 0.15 IU/mL — ABNORMAL LOW (ref 0.30–0.70)

## 2022-05-22 LAB — PROTIME-INR
INR: 1.3 — ABNORMAL HIGH (ref 0.8–1.2)
Prothrombin Time: 15.6 seconds — ABNORMAL HIGH (ref 11.4–15.2)

## 2022-05-22 LAB — COMPREHENSIVE METABOLIC PANEL
ALT: 25 U/L (ref 0–44)
AST: 47 U/L — ABNORMAL HIGH (ref 15–41)
Albumin: 4 g/dL (ref 3.5–5.0)
Alkaline Phosphatase: 44 U/L (ref 38–126)
Anion gap: 12 (ref 5–15)
BUN: 15 mg/dL (ref 8–23)
CO2: 20 mmol/L — ABNORMAL LOW (ref 22–32)
Calcium: 9.2 mg/dL (ref 8.9–10.3)
Chloride: 103 mmol/L (ref 98–111)
Creatinine, Ser: 0.59 mg/dL (ref 0.44–1.00)
GFR, Estimated: >60 mL/min (ref >60)
Glucose, Bld: 201 mg/dL — ABNORMAL HIGH (ref 70–99)
Potassium: 4.4 mmol/L (ref 3.5–5.1)
Sodium: 135 mmol/L (ref 135–145)
Total Bilirubin: 1.1 mg/dL (ref 0.3–1.2)
Total Protein: 8.1 g/dL (ref 6.5–8.1)

## 2022-05-22 LAB — LIPASE, BLOOD: Lipase: 40 U/L (ref 11–51)

## 2022-05-22 LAB — APTT: aPTT: 37 seconds — ABNORMAL HIGH (ref 24–36)

## 2022-05-22 LAB — TROPONIN I (HIGH SENSITIVITY)
Troponin I (High Sensitivity): 8 ng/L (ref <18)
Troponin I (High Sensitivity): 17 ng/L (ref <18)
Troponin I (High Sensitivity): 18 ng/L — ABNORMAL HIGH (ref <18)
Troponin I (High Sensitivity): 17 ng/L (ref <18)

## 2022-05-22 LAB — CBC
HCT: 42.5 % (ref 36.0–46.0)
Hemoglobin: 13.6 g/dL (ref 12.0–15.0)
MCH: 30.2 pg (ref 26.0–34.0)
MCHC: 32 g/dL (ref 30.0–36.0)
MCV: 94.2 fL (ref 80.0–100.0)
Platelets: 163 10*3/uL (ref 150–400)
RBC: 4.51 MIL/uL (ref 3.87–5.11)
RDW: 13.7 % (ref 11.5–15.5)
WBC: 9.1 10*3/uL (ref 4.0–10.5)
nRBC: 0 % (ref 0.0–0.2)

## 2022-05-22 LAB — BRAIN NATRIURETIC PEPTIDE: B Natriuretic Peptide: 212.9 pg/mL — ABNORMAL HIGH (ref 0.0–100.0)

## 2022-05-22 LAB — D-DIMER, QUANTITATIVE: D-Dimer, Quant: 1.26 ug/mL-FEU — ABNORMAL HIGH (ref 0.00–0.50)

## 2022-05-22 LAB — T4, FREE: Free T4: 0.78 ng/dL (ref 0.61–1.12)

## 2022-05-22 LAB — TSH: TSH: 3.119 u[IU]/mL (ref 0.350–4.500)

## 2022-05-22 MED ORDER — DM-GUAIFENESIN ER 30-600 MG PO TB12: 1.0000 | ORAL_TABLET | Freq: Two times a day (BID) | ORAL | Status: DC | PRN

## 2022-05-22 MED ORDER — ASPIRIN 81 MG PO CHEW: 324.0000 mg | CHEWABLE_TABLET | Freq: Once | ORAL | Status: DC

## 2022-05-22 MED ORDER — METOPROLOL TARTRATE 25 MG PO TABS
25.0000 mg | ORAL_TABLET | Freq: Three times a day (TID) | ORAL | Status: DC
  Administered 2022-05-23 – 2022-05-24 (×4): 25 mg via ORAL
  Filled 2022-05-22 (×4): qty 1

## 2022-05-22 MED ORDER — INSULIN ASPART 100 UNIT/ML IJ SOLN: 0.0000 [IU] | Freq: Every day | INTRAMUSCULAR | Status: DC

## 2022-05-22 MED ORDER — FERROUS SULFATE 325 (65 FE) MG PO TABS
325.0000 mg | ORAL_TABLET | Freq: Every day | ORAL | Status: DC
  Administered 2022-05-23 – 2022-05-24 (×2): 325 mg via ORAL
  Filled 2022-05-22 (×2): qty 1

## 2022-05-22 MED ORDER — HEPARIN (PORCINE) 25000 UT/250ML-% IV SOLN
1100.0000 [IU]/h | INTRAVENOUS | Status: DC
  Administered 2022-05-22: 900 [IU]/h via INTRAVENOUS
  Filled 2022-05-22 (×2): qty 250

## 2022-05-22 MED ORDER — DILTIAZEM HCL-DEXTROSE 125-5 MG/125ML-% IV SOLN (PREMIX)
5.0000 mg/h | INTRAVENOUS | Status: DC
  Administered 2022-05-22: 5 mg/h via INTRAVENOUS
  Filled 2022-05-22: qty 125

## 2022-05-22 MED ORDER — ALUM & MAG HYDROXIDE-SIMETH 200-200-20 MG/5ML PO SUSP: 30.0000 mL | Freq: Once | ORAL | Status: DC

## 2022-05-22 MED ORDER — NITROGLYCERIN 0.4 MG SL SUBL: 0.4000 mg | SUBLINGUAL_TABLET | SUBLINGUAL | Status: DC | PRN

## 2022-05-22 MED ORDER — OMEGA-3-ACID ETHYL ESTERS 1 G PO CAPS
1.0000 | ORAL_CAPSULE | Freq: Every day | ORAL | Status: DC
  Administered 2022-05-23 – 2022-05-24 (×2): 1 g via ORAL
  Filled 2022-05-22 (×3): qty 1

## 2022-05-22 MED ORDER — METOPROLOL TARTRATE 5 MG/5ML IV SOLN: 5.0000 mg | INTRAVENOUS | Status: DC | PRN

## 2022-05-22 MED ORDER — INSULIN ASPART 100 UNIT/ML IJ SOLN
0.0000 [IU] | Freq: Three times a day (TID) | INTRAMUSCULAR | Status: DC
  Administered 2022-05-23: 5 [IU] via SUBCUTANEOUS
  Filled 2022-05-22 (×2): qty 1

## 2022-05-22 MED ORDER — DILTIAZEM HCL 25 MG/5ML IV SOLN
15.0000 mg | Freq: Once | INTRAVENOUS | Status: AC
  Administered 2022-05-22: 15 mg via INTRAVENOUS

## 2022-05-22 MED ORDER — AMIODARONE LOAD VIA INFUSION
150.0000 mg | Freq: Once | INTRAVENOUS | Status: AC
  Administered 2022-05-22: 150 mg via INTRAVENOUS
  Filled 2022-05-22: qty 83.34

## 2022-05-22 MED ORDER — KETOTIFEN FUMARATE 0.025 % OP SOLN: 1.0000 [drp] | Freq: Every day | OPHTHALMIC | Status: DC | PRN

## 2022-05-22 MED ORDER — PANTOPRAZOLE SODIUM 40 MG PO TBEC
40.0000 mg | DELAYED_RELEASE_TABLET | Freq: Once | ORAL | Status: AC
  Administered 2022-05-22: 40 mg via ORAL
  Filled 2022-05-22: qty 1

## 2022-05-22 MED ORDER — ACETAMINOPHEN 325 MG PO TABS
650.0000 mg | ORAL_TABLET | Freq: Four times a day (QID) | ORAL | Status: DC | PRN
  Administered 2022-05-22 – 2022-05-24 (×4): 650 mg via ORAL
  Filled 2022-05-22 (×4): qty 2

## 2022-05-22 MED ORDER — HEPARIN BOLUS VIA INFUSION
1900.0000 [IU] | Freq: Once | INTRAVENOUS | Status: AC
  Administered 2022-05-22: 1900 [IU] via INTRAVENOUS
  Filled 2022-05-22: qty 1900

## 2022-05-22 MED ORDER — AMIODARONE HCL IN DEXTROSE 360-4.14 MG/200ML-% IV SOLN
30.0000 mg/h | INTRAVENOUS | Status: DC
  Administered 2022-05-23: 30 mg/h via INTRAVENOUS

## 2022-05-22 MED ORDER — SODIUM CHLORIDE 0.9 % IV BOLUS
1000.0000 mL | Freq: Once | INTRAVENOUS | Status: AC
  Administered 2022-05-22: 1000 mL via INTRAVENOUS

## 2022-05-22 MED ORDER — TURMERIC 500 MG PO CAPS: ORAL_CAPSULE | Freq: Every day | ORAL | Status: DC

## 2022-05-22 MED ORDER — METOPROLOL TARTRATE 25 MG PO TABS
25.0000 mg | ORAL_TABLET | Freq: Three times a day (TID) | ORAL | Status: DC
  Administered 2022-05-22 – 2022-05-23 (×2): 25 mg via ORAL
  Filled 2022-05-22 (×2): qty 1

## 2022-05-22 MED ORDER — DIPHENHYDRAMINE HCL 50 MG/ML IJ SOLN
50.0000 mg | Freq: Once | INTRAMUSCULAR | Status: DC
  Filled 2022-05-22: qty 1

## 2022-05-22 MED ORDER — HEPARIN BOLUS VIA INFUSION
3000.0000 [IU] | Freq: Once | INTRAVENOUS | Status: AC
  Administered 2022-05-22: 3000 [IU] via INTRAVENOUS
  Filled 2022-05-22: qty 3000

## 2022-05-22 MED ORDER — DILTIAZEM HCL 25 MG/5ML IV SOLN
10.0000 mg | Freq: Once | INTRAVENOUS | Status: DC
  Filled 2022-05-22: qty 5

## 2022-05-22 MED ORDER — ONDANSETRON HCL 4 MG/2ML IJ SOLN
4.0000 mg | Freq: Once | INTRAMUSCULAR | Status: AC
  Administered 2022-05-22: 4 mg via INTRAVENOUS
  Filled 2022-05-22: qty 2

## 2022-05-22 MED ORDER — ACETAMINOPHEN 500 MG PO TABS
1000.0000 mg | ORAL_TABLET | Freq: Once | ORAL | Status: AC
  Administered 2022-05-22: 1000 mg via ORAL
  Filled 2022-05-22: qty 2

## 2022-05-22 MED ORDER — FAMOTIDINE 20 MG PO TABS
20.0000 mg | ORAL_TABLET | Freq: Once | ORAL | Status: AC
  Administered 2022-05-22: 20 mg via ORAL
  Filled 2022-05-22: qty 1

## 2022-05-22 MED ORDER — METHYLPREDNISOLONE SODIUM SUCC 40 MG IJ SOLR
40.0000 mg | Freq: Once | INTRAMUSCULAR | Status: AC
  Administered 2022-05-22: 40 mg via INTRAVENOUS
  Filled 2022-05-22: qty 1

## 2022-05-22 MED ORDER — IOHEXOL 350 MG/ML SOLN
75.0000 mL | Freq: Once | INTRAVENOUS | Status: AC | PRN
  Administered 2022-05-22: 75 mL via INTRAVENOUS

## 2022-05-22 MED ORDER — IBUPROFEN 600 MG PO TABS
600.0000 mg | ORAL_TABLET | Freq: Once | ORAL | Status: AC
  Administered 2022-05-22: 600 mg via ORAL
  Filled 2022-05-22: qty 1

## 2022-05-22 MED ORDER — DIPHENHYDRAMINE HCL 50 MG/ML IJ SOLN: 12.5000 mg | Freq: Three times a day (TID) | INTRAMUSCULAR | Status: DC | PRN

## 2022-05-22 MED ORDER — BUSPIRONE HCL 10 MG PO TABS
5.0000 mg | ORAL_TABLET | Freq: Three times a day (TID) | ORAL | Status: DC | PRN
  Administered 2022-05-23: 5 mg via ORAL
  Filled 2022-05-22: qty 1

## 2022-05-22 MED ORDER — ALBUTEROL SULFATE (2.5 MG/3ML) 0.083% IN NEBU: 2.5000 mg | INHALATION_SOLUTION | RESPIRATORY_TRACT | Status: DC | PRN

## 2022-05-22 MED ORDER — DILTIAZEM HCL 25 MG/5ML IV SOLN
10.0000 mg | Freq: Once | INTRAVENOUS | Status: AC
  Administered 2022-05-22: 10 mg via INTRAVENOUS
  Filled 2022-05-22: qty 5

## 2022-05-22 MED ORDER — DIPHENHYDRAMINE HCL 50 MG/ML IJ SOLN: 50.0000 mg | Freq: Once | INTRAMUSCULAR | Status: AC

## 2022-05-22 MED ORDER — MAGNESIUM OXIDE -MG SUPPLEMENT 400 (240 MG) MG PO TABS
200.0000 mg | ORAL_TABLET | Freq: Every day | ORAL | Status: DC
  Administered 2022-05-22 – 2022-05-24 (×3): 200 mg via ORAL
  Filled 2022-05-22 (×3): qty 1

## 2022-05-22 MED ORDER — AMIODARONE LOAD VIA INFUSION
150.0000 mg | Freq: Once | INTRAVENOUS | Status: DC
  Filled 2022-05-22: qty 83.34

## 2022-05-22 MED ORDER — FENTANYL CITRATE PF 50 MCG/ML IJ SOSY
12.5000 ug | PREFILLED_SYRINGE | INTRAMUSCULAR | Status: DC | PRN
  Administered 2022-05-24: 12.5 ug via INTRAVENOUS
  Filled 2022-05-22: qty 1

## 2022-05-22 MED ORDER — FUROSEMIDE 20 MG PO TABS
20.0000 mg | ORAL_TABLET | Freq: Every day | ORAL | Status: DC
  Administered 2022-05-23 – 2022-05-24 (×2): 20 mg via ORAL
  Filled 2022-05-22 (×3): qty 1

## 2022-05-22 MED ORDER — METOPROLOL TARTRATE 5 MG/5ML IV SOLN
5.0000 mg | Freq: Once | INTRAVENOUS | Status: AC
  Administered 2022-05-22: 5 mg via INTRAVENOUS
  Filled 2022-05-22: qty 5

## 2022-05-22 MED ORDER — ASPIRIN 81 MG PO TBEC
81.0000 mg | DELAYED_RELEASE_TABLET | Freq: Every day | ORAL | Status: DC
  Administered 2022-05-23 – 2022-05-24 (×2): 81 mg via ORAL
  Filled 2022-05-22 (×2): qty 1

## 2022-05-22 MED ORDER — POLYVINYL ALCOHOL 1.4 % OP SOLN: 2.0000 [drp] | Freq: Every day | OPHTHALMIC | Status: DC | PRN

## 2022-05-22 MED ORDER — PANTOPRAZOLE SODIUM 40 MG PO TBEC
40.0000 mg | DELAYED_RELEASE_TABLET | Freq: Every day | ORAL | Status: DC
  Administered 2022-05-23 – 2022-05-24 (×2): 40 mg via ORAL
  Filled 2022-05-22 (×2): qty 1

## 2022-05-22 MED ORDER — DIPHENHYDRAMINE HCL 50 MG/ML IJ SOLN
50.0000 mg | Freq: Once | INTRAMUSCULAR | Status: AC
  Administered 2022-05-23: 50 mg via INTRAVENOUS

## 2022-05-22 MED ORDER — HYDRALAZINE HCL 20 MG/ML IJ SOLN: 5.0000 mg | INTRAMUSCULAR | Status: DC | PRN

## 2022-05-22 MED ORDER — AMIODARONE HCL IN DEXTROSE 360-4.14 MG/200ML-% IV SOLN
60.0000 mg/h | INTRAVENOUS | Status: AC
  Administered 2022-05-22 (×2): 60 mg/h via INTRAVENOUS
  Filled 2022-05-22 (×2): qty 200

## 2022-05-22 MED ORDER — MORPHINE SULFATE (PF) 2 MG/ML IV SOLN
2.0000 mg | Freq: Once | INTRAVENOUS | Status: AC
  Administered 2022-05-22: 2 mg via INTRAVENOUS
  Filled 2022-05-22: qty 1

## 2022-05-22 MED ORDER — LIDOCAINE VISCOUS HCL 2 % MT SOLN: 15.0000 mL | Freq: Once | OROMUCOSAL | Status: DC

## 2022-05-22 MED ORDER — LEVOTHYROXINE SODIUM 25 MCG PO TABS
125.0000 ug | ORAL_TABLET | Freq: Every day | ORAL | Status: DC
  Administered 2022-05-23 – 2022-05-24 (×2): 125 ug via ORAL
  Filled 2022-05-22: qty 3
  Filled 2022-05-22: qty 1

## 2022-05-22 MED ORDER — VITAMIN C 500 MG PO TABS
500.0000 mg | ORAL_TABLET | Freq: Every day | ORAL | Status: DC
  Administered 2022-05-23 – 2022-05-24 (×2): 500 mg via ORAL
  Filled 2022-05-22 (×2): qty 1

## 2022-05-22 MED ORDER — CHOLECALCIFEROL 10 MCG/ML (400 UNIT/ML) PO LIQD
4000.0000 [IU] | Freq: Every day | ORAL | Status: DC
  Administered 2022-05-23 – 2022-05-24 (×2): 4000 [IU] via ORAL
  Filled 2022-05-22 (×4): qty 10

## 2022-05-22 MED ORDER — DILTIAZEM HCL 60 MG PO TABS
60.0000 mg | ORAL_TABLET | Freq: Once | ORAL | Status: AC
  Administered 2022-05-22: 60 mg via ORAL
  Filled 2022-05-22: qty 1

## 2022-05-22 MED ORDER — METOPROLOL TARTRATE 5 MG/5ML IV SOLN
2.5000 mg | INTRAVENOUS | Status: DC | PRN
  Filled 2022-05-22: qty 5

## 2022-05-22 MED ORDER — DIPHENHYDRAMINE HCL 25 MG PO CAPS
50.0000 mg | ORAL_CAPSULE | Freq: Once | ORAL | Status: AC
  Administered 2022-05-22: 50 mg via ORAL
  Filled 2022-05-22: qty 2

## 2022-05-22 NOTE — ED Notes (Signed)
Per [place] in pharmacy - TITRATE DILTAZEM TO 7.5MG  FOR 30 MIN PRIOR TO STOPPING. OK TO RUN AMIO AND DILT TOGETHER.   CROSS SENSITIVITY TO IODINE CONTRAST, MONITOR FOR SYMPTOMS.

## 2022-05-22 NOTE — ED Notes (Signed)
Pt HR sustained at 140 after IV dilt push - BP remains stable.  IV cardizem running at 5mg /hr - notifie ddr. .

## 2022-05-22 NOTE — ED Notes (Signed)
Dr. Clyde Lundborg made aware of d-dimer level  Heparin continues at 900 units/hr Dilt at 10mg /hr

## 2022-05-22 NOTE — ED Notes (Signed)
Unable to complete CT angio due to continued HR in the 140s - Dr. Clyde Lundborg made aware.    Patient no outside the window for CT imaging with contrast without repeating allergy cocktail

## 2022-05-22 NOTE — ED Notes (Signed)
Minimal improvement on HR after IV metoprolol - new EKG obtained, notified dr. Clyde Lundborg

## 2022-05-22 NOTE — ED Notes (Signed)
Dr Niu at bedside 

## 2022-05-22 NOTE — Consult Note (Addendum)
ANTICOAGULATION CONSULT NOTE - Initial Consult  Pharmacy Consult for Heparin infusion Indication: atrial fibrillation  Allergies  Allergen Reactions   Cephalosporins Itching   Codeine Other (See Comments)    Extreme drowsiness   Contrast Media [Iodinated Contrast Media] Hives and Swelling    Facial swelling   Oxycodone Other (See Comments)    Extreme drowsiness    Patient Measurements: Height: 5\' 2"  (157.5 cm) Weight: 67.1 kg (148 lb) IBW/kg (Calculated) : 50.1 Heparin Dosing Weight: 63.9kg  Vital Signs: Temp: 98.9 F (37.2 C) (12/17 1546) Temp Source: Oral (12/17 1546) BP: 108/69 (12/17 2139) Pulse Rate: 66 (12/17 2139)  Labs: Recent Labs    05/22/22 1010 05/22/22 1202 05/22/22 1831  HGB 13.6  --   --   HCT 42.5  --   --   PLT 163  --   --   APTT  --  37*  --   LABPROT  --  15.6*  --   INR  --  1.3*  --   HEPARINUNFRC  --   --  0.15*  CREATININE 0.59  --   --   TROPONINIHS 8 17 18*     Estimated Creatinine Clearance: 52.9 mL/min (by C-G formula based on SCr of 0.59 mg/dL).   Medical History: Past Medical History:  Diagnosis Date   Anemia    Asthma    COPD (chronic obstructive pulmonary disease) (HCC)    Diabetes mellitus without complication (HCC)    type 2   DVT (deep venous thrombosis) (HCC) 1980   GERD (gastroesophageal reflux disease)    History of hiatal hernia    Hypertension    Hypothyroidism     Medications:  No AC prior to admission  Assessment: Patient admitted with new onset Afib. 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 No other hx of anticoagulation use noted. Pharmacy consulted to manage heparin infusion.   Goal of Therapy:  Heparin level 0.3-0.7 units/ml Monitor platelets by anticoagulation protocol: Yes   Plan: Heparin level subtherapeutic Give heparin 1900 units bolus x 1 Increase heparin infusion to 1100 units/hr Check anti-Xa level in 8 hours after rate change Continue to monitor H&H and platelets  November, PharmD 05/22/2022 9:53 PM

## 2022-05-22 NOTE — Progress Notes (Incomplete)
       CROSS COVER NOTE  NAME: [name] MRN: [id number] DOB : 01/04/1946 ATTENDING PHYSICIAN: Lorretta Harp, MD    Date of Service   05/22/2022   HPI/Events of Note   STE II, III Asymptomatic at this time, CP on arrival to [facility] ED Trop 17 R/O PE  Interventions   Assessment/Plan: SoluMedrol 40 IV Benadryl 50 IV X        To reach the provider On-Call:   7AM- 7PM see care teams to locate the attending and reach out to them via [email or web address]. 7PM-7AM contact night-coverage If you still have difficulty reaching the appropriate provider, please page the [facility] (Director on Call) for [facility] on amion for assistance  This document was prepared using Sales executive software and may include unintentional dictation errors.  Bishop Limbo DNP, MBA, FNP-BC Nurse Practitioner [facility] Pager [phone]

## 2022-05-22 NOTE — ED Notes (Signed)
Pt refusing inulin and reporting "I just want to control it the natural way when I get home." Pt educated on importance of taking insulin and informed of her recent blood sugar reading. Pt verbalized understanding and refused insulin.

## 2022-05-22 NOTE — Progress Notes (Signed)
PHARMACIST - PHYSICIAN ORDER COMMUNICATION  CONCERNING: P&T Medication Policy on Herbal Medications  DESCRIPTION:  This patient's order for:  Tumeric  has been noted.  This product(s) is classified as an "herbal" or natural product. Due to a lack of definitive safety studies or FDA approval, nonstandard manufacturing practices, plus the potential risk of unknown drug-drug interactions while on inpatient medications, the Pharmacy and Therapeutics Committee does not permit the use of "herbal" or natural products of this type within [place].   ACTION TAKEN: The pharmacy department is unable to verify this order at this time and your patient has been informed of this safety policy. Please reevaluate patient's clinical condition at discharge and address if the herbal or natural product(s) should be resumed at that time.   

## 2022-05-22 NOTE — H&P (Addendum)
History and Physical    [name] EXB:[id number] DOB: Oct 08, 1945 DOA: 05/22/2022  Referring MD/NP/PA:   PCP: Pcp, No   Patient coming from:  The patient is coming from home.  At baseline, pt is independent for most of ADL.        Chief Complaint: Chest pain  HPI: [name] is a 76 y.o. female with medical history significant of hypertension, diabetes mellitus, COPD, asthma, GERD, hypothyroidism, depression, remote DVT 1980 (not on anticoagulants anymore), iron deficiency anemia, who presents with chest pain.   Pt states that she started having mild chest pain last night, which has worsened since this morning.  Chest pain is located in substernal area, pressure-like, moderate, nonradiating.  Associated with shortness of breath.  She has mild cough, no fever or chills.  Patient does not have nausea, vomiting or abdominal pain.  Patient is constipated.  No symptoms of UTI.  In ED, patient is found to have new onset atrial fibrillation with RVR, heart rate up to 150s.  Patient was given several doses of Cardizem in ED, heart rate temporarily decreased to 130s, and then comes back to 140-150s.  Cardizem drip started.    Data reviewed independently and ED Course: pt was found to have troponin level 8, 17, INR 1.3, PTT 37, BNP 212, lipase 40, liver function normal, D-dimer 1.26, GFR> 60, temperature normal, blood pressure 103/62, RR 25, oxygen saturation 93% on room air.  Chest x-ray showed a bronchitic change.  Patient is admitted to PCU as inpt. Dr. Juliann Pares of card is consulted.    EKG: I have personally reviewed.  Atrial fibrillation, QTc 492, low voltage, LAD, poor R wave progression   Review of Systems:   General: no fevers, chills, no body weight gain, has poor appetite, has fatigue HEENT: no blurry vision, hearing changes or sore throat Respiratory: has dyspnea, coughing, no wheezing CV: has chest pain, no palpitations GI: no nausea, vomiting, abdominal pain, diarrhea,  constipation GU: no dysuria, burning on urination, increased urinary frequency, hematuria  Ext: no leg edema Neuro: no unilateral weakness, numbness, or tingling, no vision change or hearing loss Skin: no rash, no skin tear. MSK: No muscle spasm, no deformity, no limitation of range of movement in spin Heme: No easy bruising.  Travel history: No recent long distant travel.   Allergy:  Allergies  Allergen Reactions   Cephalosporins Itching   Codeine Other (See Comments)    Extreme drowsiness   Contrast Media [Iodinated Contrast Media] Hives and Swelling    Facial swelling   Oxycodone Other (See Comments)    Extreme drowsiness    Past Medical History:  Diagnosis Date   Anemia    Asthma    COPD (chronic obstructive pulmonary disease) (HCC)    Diabetes mellitus without complication (HCC)    type 2   DVT (deep venous thrombosis) (HCC) 1980   GERD (gastroesophageal reflux disease)    History of hiatal hernia    Hypertension    Hypothyroidism     Past Surgical History:  Procedure Laterality Date   CATARACT EXTRACTION Bilateral    CHOLECYSTECTOMY     COLONOSCOPY     ESOPHAGOGASTRODUODENOSCOPY (EGD) WITH PROPOFOL N/A 01/17/2022   Procedure: ESOPHAGOGASTRODUODENOSCOPY (EGD) WITH PROPOFOL;  Surgeon: Jaynie Collins, DO;  Location: [facility] ENDOSCOPY;  Service: Gastroenterology;  Laterality: N/A;   HUMERUS SURGERY Left    fracture (7 surgeries total)   MENISCUS REPAIR Right 2019   THYROIDECTOMY     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 She has never used smokeless tobacco. She reports that she does not drink alcohol and does not use drugs.  Family History:  Family History  Problem Relation Age of Onset   Kidney disease Mother      Prior to Admission medications   Medication Sig Start Date End Date Taking? Authorizing Provider  acetaminophen (TYLENOL 8 HOUR) 650 MG CR tablet Take 650 mg by mouth every 8 (eight) hours as needed for pain.    [provider]  albuterol (PROVENTIL) (2.5 MG/3ML) 0.083% nebulizer solution Take 2.5 mg by nebulization every 4 (four) hours as needed for wheezing or shortness of breath.    [provider]  albuterol (VENTOLIN HFA) 108 (90 Base) MCG/ACT inhaler Inhale 2-4 puffs by mouth every 4 hours as needed for wheezing, cough, and/or shortness of breath 11/28/20   Loleta Rose, MD  Ascorbic Acid (VITAMIN C CR) 1500 MG TBCR Take 1 tablet by mouth daily.    [provider]  benzonatate (TESSALON PERLES) 100 MG capsule Take 1 capsule (100 mg total) by mouth 3 (three) times daily as needed for cough. 03/15/22 03/15/23  Evon Slack, PA-C  busPIRone (BUSPAR) 5 MG tablet Take 5 mg by mouth 3 (three) times daily as needed.    [provider]  Carboxymeth-Glycerin-Polysorb (REFRESH OPTIVE ADVANCED) 0.5-1-0.5 % SOLN Apply 2 drops to eye daily.    [provider]  cyclobenzaprine (FLEXERIL) 5 MG tablet Take 1 tablet (5 mg total) by mouth 3 (three) times daily as needed for muscle spasms. 03/15/22   Evon Slack, PA-C  docusate sodium (COLACE) 100 MG capsule Take 1 capsule (100 mg total) by mouth 2 (two) times daily. 03/15/22   Evon Slack, PA-C  Emollient (EUCERIN) lotion Apply 1 Application topically as needed for dry skin.    [provider]  enalapril (VASOTEC) 20 MG tablet Take 20 mg by mouth daily.    [provider]  enoxaparin (LOVENOX) 40 MG/0.4ML injection Inject 0.4 mLs (40 mg total) into the skin  daily for 14 days. 03/15/22 03/29/22  Evon Slack, PA-C  ferrous sulfate 325 (65 FE) MG tablet Take 325 mg by mouth daily.    [provider]  fluticasone (FLONASE) 50 MCG/ACT nasal spray Place 2 sprays into both nostrils daily as needed for allergies or rhinitis.    [provider]  furosemide (LASIX) 20 MG tablet Take 20 mg by mouth daily.    [provider]  hydrocortisone cream 1 % Apply 1 Application topically 2 (two) times daily as needed for itching.    [provider]  ketoconazole (NIZORAL) 2 % shampoo Apply 1 Application topically 2 (two) times a week.    [provider]  ketotifen (ZADITOR) 0.025 % ophthalmic solution Place 1 drop into both eyes daily as needed.    [provider]  levothyroxine (SYNTHROID) 112 MCG tablet Take 112 mcg by mouth daily before breakfast.    [provider]  Magnesium 250 MG TABS Take 1 tablet by mouth daily.    [provider]  metFORMIN (GLUCOPHAGE) 500 MG tablet Take 1 tablet (500 mg total) by mouth daily with breakfast. 03/16/22   Evon Slack, PA-C  Omega-3 1000 MG CAPS Take 1 capsule by mouth daily.    [provider]  omeprazole (PRILOSEC) 20 MG capsule Take 20 mg by mouth daily.    [provider]  OXYGEN Inhale 2 L into the lungs at bedtime.    [provider]  potassium chloride (KLOR-CON M) 10 MEQ tablet Take 10 mEq by mouth daily.    [provider]  traMADol (ULTRAM) 50 MG tablet Take 1 tablet (50 mg total) by mouth every 6 (six) hours as needed for moderate pain. 03/15/22   Evon Slack, PA-C  TURMERIC PO Take 1 capsule by mouth daily.    [provider]  VITAMIN D-VITAMIN K PO Take 1 tablet by mouth daily.    [provider]    Physical Exam: Vitals:   05/22/22 1757 05/22/22 1800 05/22/22 1810 05/22/22 1813  BP:  109/61 99/67   Pulse:  (!) 136 (!) 135 (!) 136  Resp:      Temp:      TempSrc:       SpO2: 95% 96% 98% 96%  Weight:      Height:       General: Not in acute distress HEENT:       Eyes: PERRL, EOMI, no scleral icterus.       ENT: No discharge from the ears and nose, no pharynx injection, no tonsillar enlargement.        Neck: No JVD, no bruit, no mass felt. Heme: No neck lymph node enlargement. Cardiac: S1/S2, irregularly irregular rhythm, no murmurs, No gallops or rubs. Respiratory: No rales, wheezing, rhonchi or rubs. GI: Soft, nondistended, nontender, no rebound pain, no organomegaly, BS present. GU: No hematuria Ext: No pitting leg edema bilaterally. 1+DP/PT pulse bilaterally. Musculoskeletal: No joint deformities, No joint redness or warmth, no limitation of ROM in spin. Skin: No rashes.  Neuro: Alert, oriented X3, cranial nerves II-XII grossly intact, moves all extremities normally Psych: Patient is not psychotic, no suicidal or hemocidal ideation.  Labs on Admission: I have personally reviewed following labs and imaging studies  CBC: Recent Labs  Lab 05/22/22 1010  WBC 9.1  HGB 13.6  HCT 42.5  MCV 94.2  PLT 163   Basic Metabolic Panel: Recent Labs  Lab 05/22/22 1010  NA 135  K 4.4  CL 103  CO2 20*  GLUCOSE 201*  BUN 15  CREATININE 0.59  CALCIUM 9.2   GFR: Estimated Creatinine Clearance: 52.9 mL/min (by C-G formula based on SCr of 0.59 mg/dL). Liver Function Tests: Recent Labs  Lab 05/22/22 1010  AST 47*  ALT 25  ALKPHOS 44  BILITOT 1.1  PROT 8.1  ALBUMIN 4.0   Recent Labs  Lab 05/22/22 1010  LIPASE 40   No results for input(s): "AMMONIA" in the last 168 hours. Coagulation Profile: Recent Labs  Lab 05/22/22 1202  INR 1.3*   Cardiac Enzymes: No results for input(s): "CKTOTAL", "CKMB", "CKMBINDEX", "TROPONINI" in the last 168 hours. BNP (last 3 results) No results for input(s): "PROBNP" in the last 8760 hours. HbA1C: No results for input(s): "HGBA1C" in the last 72 hours. CBG: No results for input(s): "GLUCAP" in the  last 168 hours. Lipid Profile: No results for input(s): "CHOL", "HDL", "LDLCALC", "TRIG", "CHOLHDL", "LDLDIRECT"  in the last 72 hours. Thyroid Function Tests: Recent Labs    05/22/22 1010  TSH 3.119  FREET4 0.78   Anemia Panel: No results for input(s): "VITAMINB12", "FOLATE", "FERRITIN", "TIBC", "IRON", "RETICCTPCT" in the last 72 hours. Urine analysis:    Component Value Date/Time   COLORURINE STRAW (A) 03/02/2022 1117   APPEARANCEUR CLEAR (A) 03/02/2022 1117   LABSPEC 1.008 03/02/2022 1117   PHURINE 5.0 03/02/2022 1117   GLUCOSEU NEGATIVE 03/02/2022 1117   HGBUR SMALL (A) 03/02/2022 1117   BILIRUBINUR NEGATIVE 03/02/2022 1117   KETONESUR NEGATIVE 03/02/2022 1117   PROTEINUR NEGATIVE 03/02/2022 1117   NITRITE NEGATIVE 03/02/2022 1117   LEUKOCYTESUR NEGATIVE 03/02/2022 1117   Sepsis Labs: @LABRCNTIP (procalcitonin:4,lacticidven:4) )No results found for this or any previous visit (from the past 240 hour(s)).   Radiological Exams on Admission: Venous Img Lower Bilateral (DVT)  Result Date: 05/22/2022 CLINICAL DATA:  Positive D-dimer, right knee surgery on March 14, 2022 EXAM: BILATERAL LOWER EXTREMITY VENOUS DOPPLER ULTRASOUND TECHNIQUE: Gray-scale sonography with graded compression, as well as color Doppler and duplex ultrasound were performed to evaluate the lower extremity deep venous systems from the level of the common femoral vein and including the common femoral, femoral, profunda femoral, popliteal and calf veins including the posterior tibial, peroneal and gastrocnemius veins when visible. The superficial great saphenous vein was also interrogated. Spectral Doppler was utilized to evaluate flow at rest and with distal augmentation maneuvers in the common femoral, femoral and popliteal veins. COMPARISON:  Right lower extremity venous Doppler ultrasound September 22nd 2023 FINDINGS: RIGHT LOWER EXTREMITY Common Femoral Vein: No evidence of thrombus. Normal compressibility,  respiratory phasicity and response to augmentation. Saphenofemoral Junction: No evidence of thrombus. Normal compressibility and flow on color Doppler imaging. Profunda Femoral Vein: No evidence of thrombus. Normal compressibility and flow on color Doppler imaging. Femoral Vein: No evidence of thrombus. Normal compressibility, respiratory phasicity and response to augmentation. Popliteal Vein: No evidence of thrombus. Normal compressibility, respiratory phasicity and response to augmentation. Calf Veins: No evidence of thrombus. Normal compressibility and flow on color Doppler imaging. Superficial Great Saphenous Vein: No evidence of thrombus. Normal compressibility. Venous Reflux:  None. Other Findings:  None. LEFT LOWER EXTREMITY Common Femoral Vein: No evidence of thrombus. Normal compressibility, respiratory phasicity and response to augmentation. Saphenofemoral Junction: No evidence of thrombus. Normal compressibility and flow on color Doppler imaging. Profunda Femoral Vein: No evidence of thrombus. Normal compressibility and flow on color Doppler imaging. Femoral Vein: No evidence of thrombus. Normal compressibility, respiratory phasicity and response to augmentation. Popliteal Vein: No evidence of thrombus. Normal compressibility, respiratory phasicity and response to augmentation. Calf Veins: No evidence of thrombus. Normal compressibility and flow on color Doppler imaging. Superficial Great Saphenous Vein: No evidence of thrombus. Normal compressibility. Venous Reflux:  None. Other Findings:  None. IMPRESSION: No evidence of deep venous thrombosis in either lower extremity. Electronically Signed   By: 08-12-1980 M.D.   On: 05/22/2022 18:19   DG Chest Port 1 View  Result Date: 05/22/2022 CLINICAL DATA:  76 year old female with history of chest pain. EXAM: PORTABLE CHEST 1 VIEW COMPARISON:  Chest x-ray 12/31/2021. FINDINGS: Lung volumes are normal. Diffuse interstitial prominence and widespread  peribronchial cuffing. No consolidative airspace disease. No pleural effusions. No pneumothorax. Calcified granulomas in the right upper lobe again noted. No other suspicious appearing pulmonary nodule or mass noted. Pulmonary vasculature and the cardiomediastinal silhouette are within normal limits. Atherosclerosis in the thoracic aorta. Extensive calcifications of the mitral annulus. Status  post left shoulder arthroplasty. Electronic device projecting over the left hemithorax, likely an implantable loop recorder. IMPRESSION: 1. Diffuse interstitial prominence and peribronchial cuffing, concerning for an acute bronchitis. 2. Aortic atherosclerosis. Electronically Signed   By: Trudie Reedaniel  Entrikin M.D.   On: 05/22/2022 10:19      Assessment/Plan Principal Problem:   Atrial fibrillation with RVR (HCC) Active Problems:   Chest pain   HTN (hypertension)   Diabetes mellitus without complication (HCC)   COPD (chronic obstructive pulmonary disease) (HCC)   Hypothyroidism   Iron deficiency anemia   Assessment and Plan:   Atrial fibrillation with RVR (HCC):  CHA2DS2 Score is 4. This is new onset A fib with RVR. HR is upto 150s. TSH 3.119 and Free T4 0.78. Consulted Dr .Juliann Paresallwood of card.  -admit to PCU as inpt -IV heparin -continue IV Cardizem gtt - Prn IV metoprolol - if HR is not improving. Will consider to start Amiodarone gtt. - will get 2d echo.  Addendum: Dr. Juliann Paresallwood recomended to start amiodarone drip, reduce or d/c Cardizem drip, start metoprolol heart 25-50 mg of p.o. 3 times daily, IV as needed metoprolol.  Patient is allergic to iodine, but the patient was premedicated with Solu-Medrol, Pepcid and Benadryl earlier for doing CTA. -will start amiodarone drip, watch closely for any allergic reaction -Metoprolol 25 mg 3 times daily -IV metoprolol 5 mg every 2 hours for heart rate >125    Chest pain: trop 8 --> 17. D-dimer is 1.26.  Will need to rule out PE.  Patient is allergic to  contrast.  Patient is pretreated with Benadryl, Pepcid and Solu-Medrol by EDP, but due to uncontrolled heart rate, test is postponed.  Will need to be pretreated again with Benadryl, Pepcid and Solu-Medrol again in order to do CTA when heart rate is controlled.  -Trend troponin -Aspirin -check A1c and FLP -Prn nitro -f/u 2d echo -will get CTA to r/o PE.  -Follow-up lower extremity venous Doppler to rule out a DVT.  HTN (hypertension) -IV hydralazine as needed -Patient is on oral Lasix 20 mg daily -Hold enalapril since patient is on Cardizem drip  Diabetes mellitus without complication [facility](HCC): Recent A1c 7.8, poorly controlled. -Sliding scale insulin  COPD (chronic obstructive pulmonary disease) (HCC) -Bronchodilators  Hypothyroidism -Synthroid  Iron deficiency anemia: Hemoglobin stable, 13.6. -Continue iron supplement      DVT ppx: on IV Heparin  Code Status: Full code  Family Communication:   Yes, patient's  son at bed side.   Disposition Plan:  Anticipate discharge back to previous environment  Consults called: Dr. Juliann Paresallwood of card   Admission status and Level of care: Progressive:     as inpt       Dispo: The patient is from: Home              Anticipated d/c is to: Home              Anticipated d/c date is: 2 days              Patient currently is not medically stable to d/c.    Severity of Illness:  The appropriate patient status for this patient is INPATIENT. Inpatient status is judged to be reasonable and necessary in order to provide the required intensity of service to ensure the patient's safety. The patient's presenting symptoms, physical exam findings, and initial radiographic and laboratory data in the context of their chronic comorbidities is felt to place them at high risk for further clinical deterioration.  Furthermore, it is not anticipated that the patient will be medically stable for discharge from the hospital within 2 midnights of admission.    * I certify that at the point of admission it is my clinical judgment that the patient will require inpatient hospital care spanning beyond 2 midnights from the point of admission due to high intensity of service, high risk for further deterioration and high frequency of surveillance required.*       Date of Service 05/22/2022    Lorretta Harp [facility]   If 7PM-7AM, please contact night-coverage [email or web address] 05/22/2022, 6:31 PM

## 2022-05-22 NOTE — ED Notes (Signed)
HR in 140s now - notified dr. Modesto Charon

## 2022-05-22 NOTE — ED Provider Notes (Signed)
[facility] Provider Note    None    (approximate)   History   Chest Pain   HPI  [name] is a 76 y.o. 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  This started last night at rest anterior chest nonradiating without associated shortness of breath.  No fever or cough.  No nausea or vomiting.  Pain is nonexertional.  Denies new leg pain or swelling.  She has no other acute medical complaints.    History was obtained via the patient. I reviewed external medical notes including a upper endoscopy performed August 2023 which revealed gastritis      Physical Exam   Triage Vital Signs: ED Triage Vitals  Enc Vitals Group     BP      Pulse      Resp      Temp      Temp src      SpO2      Weight      Height      Head Circumference      Peak Flow      Pain Score      Pain Loc      Pain Edu?      Excl. in [place]?     Most recent vital signs: Vitals:   05/22/22 1445 05/22/22 1535  BP: (!) 127/55 130/73  Pulse: (!) 137 (!) 140  Resp:  19  Temp:    SpO2: 96% 95%    General: Awake, no distress.  CV:  Good peripheral perfusion.  Slightly dry mucous membranes. Resp:  Normal effort.  Abd:  No distention.  Other:  Lungs clear to auscultation without focality or wheezing.  No leg swelling or pain.  Abdomen soft and nontender deep palpation all quadrants.  Radial pulses intact bilaterally.  She is tachycardic but normotensive.   ED Results / Procedures / Treatments   Labs (all labs ordered are listed, but only abnormal results are displayed) Labs Reviewed  COMPREHENSIVE METABOLIC PANEL - Abnormal; Notable for the following components:      Result Value   CO2 20 (*)    Glucose, Bld 201 (*)    AST 47 (*)    All other components within normal limits  BRAIN NATRIURETIC PEPTIDE - Abnormal; Notable  for the following components:   B Natriuretic Peptide 212.9 (*)    All other components within normal limits  PROTIME-INR - Abnormal; Notable for the following components:   Prothrombin Time 15.6 (*)    INR 1.3 (*)    All other components within normal limits  APTT - Abnormal; Notable for the following components:   aPTT 37 (*)    All other components within normal limits  CBC  LIPASE, BLOOD  TSH  T4, FREE  HEPARIN LEVEL (UNFRACTIONATED)  TROPONIN I (HIGH SENSITIVITY)  TROPONIN I (HIGH SENSITIVITY)     I reviewed labs and they are notable for normal white blood cell count 9.1  EKG  ED ECG REPORT I, Lucillie Garfinkel, the attending physician, personally viewed and interpreted this ECG.   Date: 05/22/2022  EKG Time: 1004  Rate: 154  Rhythm: tachycardia unclear if atrial fibrillation/flutter or sinus tachycardia  Axis: nl  Intervals:none  ST&T Change: No acute ischemic changes.    RADIOLOGY I independently reviewed and interpreted chest x-ray and see no focal consolidations or  pneumothorax   PROCEDURES:  Critical Care performed: Yes, see critical care procedure note(s)  .Critical Care  Performed by: Pilar Jarvis, MD Authorized by: Pilar Jarvis, MD   Critical care provider statement:    Critical care time (minutes):  30   Critical care was necessary to treat or prevent imminent or life-threatening deterioration of the following conditions: Atrial fibrillation with RVR requiring IV medications.   Critical care was time spent personally by me on the following activities:  Development of treatment plan with patient or surrogate, evaluation of patient's response to treatment, examination of patient, ordering and review of laboratory studies, ordering and review of radiographic studies, ordering and performing treatments and interventions, pulse oximetry, re-evaluation of patient's condition and review of old charts    MEDICATIONS ORDERED IN ED: Medications  alum & mag  hydroxide-simeth (MAALOX/MYLANTA) 200-200-20 MG/5ML suspension 30 mL (30 mLs Oral Not Given 05/22/22 1015)    And  lidocaine (XYLOCAINE) 2 % viscous mouth solution 15 mL ( Oral Canceled Entry 05/22/22 1222)  aspirin chewable tablet 324 mg ( Oral Canceled Entry 05/22/22 1100)  heparin ADULT infusion 100 units/mL (25000 units/22mL) (900 Units/hr Intravenous New Bag/Given 05/22/22 1344)  diltiazem (CARDIZEM) 125 mg in dextrose 5% 125 mL (1 mg/mL) infusion (has no administration in time range)  famotidine (PEPCID) tablet 20 mg (20 mg Oral Given 05/22/22 1142)  ondansetron (ZOFRAN) injection 4 mg (4 mg Intravenous Given 05/22/22 1055)  sodium chloride 0.9 % bolus 1,000 mL (0 mLs Intravenous Stopped 05/22/22 1227)  morphine (PF) 2 MG/ML injection 2 mg (2 mg Intravenous Given 05/22/22 1104)  methylPREDNISolone sodium succinate (SOLU-MEDROL) 40 mg/mL injection 40 mg (40 mg Intravenous Given 05/22/22 1142)  diphenhydrAMINE (BENADRYL) capsule 50 mg (50 mg Oral Given 05/22/22 1439)    Or  diphenhydrAMINE (BENADRYL) injection 50 mg ( Intravenous See Alternative 05/22/22 1439)  diltiazem (CARDIZEM) injection 10 mg (10 mg Intravenous Given 05/22/22 1057)  diltiazem (CARDIZEM) injection 15 mg (15 mg Intravenous Given 05/22/22 1140)  diltiazem (CARDIZEM) tablet 60 mg (60 mg Oral Given 05/22/22 1155)  heparin bolus via infusion 3,000 Units (3,000 Units Intravenous Bolus from Bag 05/22/22 1344)  pantoprazole (PROTONIX) EC tablet 40 mg (40 mg Oral Given 05/22/22 1439)  acetaminophen (TYLENOL) tablet 1,000 mg (1,000 mg Oral Given 05/22/22 1534)  ibuprofen (ADVIL) tablet 600 mg (600 mg Oral Given 05/22/22 1534)  diltiazem (CARDIZEM) injection 10 mg (10 mg Intravenous Given 05/22/22 1534)    Consultants:  I spoke with hospitalist for admission and regarding care plan for this patient.   IMPRESSION / MDM / ASSESSMENT AND PLAN / ED COURSE  I reviewed the triage vital signs and the nursing notes.                               Differential diagnosis includes, but is not limited to, ACS, PE, dissection, atrial fibrillation, respiratory infection, atrial fibrillation with RVR   The patient is on the cardiac monitor to evaluate for evidence of arrhythmia and/or significant heart rate changes.  MDM: On better review on her cardiac monitor.  She is in atrial fibrillation normotensive, no history of the same.  Will give fluids and pain control as well as IV diltiazem for rate control.  Will assess her chest pain with EKG and troponins for ischemia as well as a CT angiogram for PE, allergy and PMH with facial swelling/hives (pt states skin rash only without airway involvement and was  advised by her doctor that she will need premedication prior to contrast in the future) so premedication protocol ordered.  A-fib with RVR is new, patient with history of blood clots no longer on anticoagulation, hemodynamics otherwise okay at the moment normotensive, given IV diltiazem 10 mg then 15 mg with good result.  Rate controlled to 100s continues to be normotensive; given PO diltiazem 60mg .  After small dose of morphine 2 mg and aspirin given by EMS, patient's chest pain is much improved at this time.  She continues to deny shortness of breath.  Patient is now tachycardic once again to the 130s 140s so I ordered for diltiazem infusion.  She is admitted to the hospitalist service.  She is awaiting CT angiogram.  She has heparin drip going.    Patient's presentation is most consistent with acute presentation with potential threat to life or bodily function.       FINAL CLINICAL IMPRESSION(S) / ED DIAGNOSES   Final diagnoses:  Nonspecific chest pain  Atrial fibrillation with RVR ([place])  New onset atrial fibrillation (HCC)  COPD exacerbation ([place])     Rx / DC Orders   ED Discharge Orders     None        Note:  This document was prepared using Dragon voice recognition software and may include unintentional  dictation errors.    Lucillie Garfinkel, MD 05/22/22 1540

## 2022-05-22 NOTE — ED Notes (Signed)
Dr Clyde Lundborg at bedside for evalution - administering IV dilt per order.

## 2022-05-22 NOTE — ED Triage Notes (Signed)
BIBA from home with central chest pain that started last night. Pt reports pain worsened this morning.   Pt tachycardic HR 140-160s, BP WNL.  Pt given 324 ASA.  PMH aflutter, copd and asthma

## 2022-05-22 NOTE — ED Notes (Addendum)
Tolerated amiodarone bolus without reaction - infusion started.   Report to [place], [place]

## 2022-05-22 NOTE — Consult Note (Signed)
CARDIOLOGY CONSULT NOTE               Patient ID: [name] MRN: [id number] DOB/AGE: May 15, 1946 76 y.o.  Admit date: 05/22/2022 Referring Physician Dr Lorretta Harp hospitalist Primary Physician [facility] Primary Cardiologist  Reason for Consultation.  Rapid atrial fibrillation chest pain  HPI: Patient is a 76 year old female history of hypertension diabetes COPD asthma GERD thyroid disease previous DVT currently not on anticoagulation with iron deficiency anemia presented with chest pain symptoms worsen since this morning chest pain was substernal pressure-like feeling nonradiating associate with shortness of breath.  Patient had a mild cough no fever chills or sweats no nausea vomiting or diarrhea no urinary symptoms.  In the emergency room patient was found to be new onset atrial fibrillation flutter around 150 and regular was placed on Cardizem with little no improvement cardiology was consulted troponins were negative BNP was also unremarkable  Review of systems complete and found to be negative unless listed above     Past Medical History:  Diagnosis Date   Anemia    Asthma    COPD (chronic obstructive pulmonary disease) (HCC)    Diabetes mellitus without complication (HCC)    type 2   DVT (deep venous thrombosis) (HCC) 1980   GERD (gastroesophageal reflux disease)    History of hiatal hernia    Hypertension    Hypothyroidism     Past Surgical History:  Procedure Laterality Date   CATARACT EXTRACTION Bilateral    CHOLECYSTECTOMY     COLONOSCOPY     ESOPHAGOGASTRODUODENOSCOPY (EGD) WITH PROPOFOL N/A 01/17/2022   Procedure: ESOPHAGOGASTRODUODENOSCOPY (EGD) WITH PROPOFOL;  Surgeon: Jaynie Collins, DO;  Location: [facility] ENDOSCOPY;  Service: Gastroenterology;  Laterality: N/A;   HUMERUS SURGERY Left    fracture (7 surgeries total)   MENISCUS REPAIR Right 2019   THYROIDECTOMY     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; reconstruction from hip bone   TONSILECTOMY,  ADENOIDECTOMY, BILATERAL MYRINGOTOMY AND TUBES     TOTAL KNEE ARTHROPLASTY Right 03/14/2022   Procedure: TOTAL KNEE ARTHROPLASTY;  Surgeon: Reinaldo Berber, MD;  Location: [facility] ORS;  Service: Orthopedics;  Laterality: Right;   TOTAL SHOULDER REPLACEMENT Left    TUBAL LIGATION     WRIST SURGERY Left    multiple fractures from falls (14 surgeries from wrist to shoulder)    (Not in a hospital admission)  Social History   Socioeconomic History   Marital status: Widowed    Spouse name: Not on file   Number of children: 6   Years of education: Not on file   Highest education level: Not on file  Occupational History   Not on file  Tobacco Use   Smoking status: Never   Smokeless tobacco: Never  Vaping Use   Vaping Use: Never used  Substance and Sexual Activity   Alcohol use: Never   Drug use: Never   Sexual activity: Not Currently  Other Topics Concern   Not on file  Social History Narrative   Lives in senior housing in [place] (apt[place]   Social Determinants of Health   Financial Resource Strain: Not on file  Food Insecurity: No Food Insecurity (03/14/2022)   Hunger Vital Sign    Worried About Running Out of Food in the Last Year: Never true    Ran Out of Food in the Last Year: Never true  Transportation Needs: No Transportation Needs (03/14/2022)   PRAPARE - Administrator, Civil Service (Medical): No  Lack of Transportation (Non-Medical): No  Physical Activity: Not on file  Stress: Not on file  Social Connections: Not on file  Intimate Partner Violence: Not At Risk (03/14/2022)   Humiliation, Afraid, Rape, and Kick questionnaire    Fear of Current or Ex-Partner: No    Emotionally Abused: No    Physically Abused: No    Sexually Abused: No    Family History  Problem Relation Age of Onset   Kidney disease Mother       Review of systems complete and found to be negative unless listed above      PHYSICAL EXAM  General: Well developed, well nourished,  in no acute distress HEENT:  Normocephalic and atramatic Neck:  No JVD.  Lungs: Clear bilaterally to auscultation and percussion. Heart:  Tachycardia irregular S1 and S2 without gallops or murmurs.  Abdomen: Bowel sounds are positive, abdomen soft and non-tender  Msk:  Back normal, normal gait. Normal strength and tone for age. Extremities: No clubbing, cyanosis or edema.   Neuro: Alert and oriented X 3. Psych:  Good affect, responds appropriately  Labs:   Lab Results  Component Value Date   WBC 9.1 05/22/2022   HGB 13.6 05/22/2022   HCT 42.5 05/22/2022   MCV 94.2 05/22/2022   PLT 163 05/22/2022    Recent Labs  Lab 05/22/22 1010  NA 135  K 4.4  CL 103  CO2 20*  BUN 15  CREATININE 0.59  CALCIUM 9.2  PROT 8.1  BILITOT 1.1  ALKPHOS 44  ALT 25  AST 47*  GLUCOSE 201*   No results found for: "CKTOTAL", "CKMB", "CKMBINDEX", "TROPONINI" No results found for: "CHOL" No results found for: "HDL" No results found for: "LDLCALC" No results found for: "TRIG" No results found for: "CHOLHDL" No results found for: "LDLDIRECT"    Radiology: US Venous Img Lower Bilateral (DVT)  Result Date: 05/22/2022 CLINICAL DATA:  Positive D-dimer, right knee surgery on March 14, 2022 EXAM: BILATERAL LOWER EXTREMITY VENOUS DOPPLER ULTRASOUND TECHNIQUE: Gray-scale sonography with graded compression, as well as color Doppler and duplex ultrasound were performed to evaluate the lower extremity deep venous systems from the level of the common femoral vein and including the common femoral, femoral, profunda femoral, popliteal and calf veins including the posterior tibial, peroneal and gastrocnemius veins when visible. The superficial great saphenous vein was also interrogated. Spectral Doppler was utilized to evaluate flow at rest and with distal augmentation maneuvers in the common femoral, femoral and popliteal veins. COMPARISON:  Right lower extremity venous Doppler ultrasound September 22nd 2023  FINDINGS: RIGHT LOWER EXTREMITY Common Femoral Vein: No evidence of thrombus. Normal compressibility, respiratory phasicity and response to augmentation. Saphenofemoral Junction: No evidence of thrombus. Normal compressibility and flow on color Doppler imaging. Profunda Femoral Vein: No evidence of thrombus. Normal compressibility and flow on color Doppler imaging. Femoral Vein: No evidence of thrombus. Normal compressibility, respiratory phasicity and response to augmentation. Popliteal Vein: No evidence of thrombus. Normal compressibility, respiratory phasicity and response to augmentation. Calf Veins: No evidence of thrombus. Normal compressibility and flow on color Doppler imaging. Superficial Great Saphenous Vein: No evidence of thrombus. Normal compressibility. Venous Reflux:  None. Other Findings:  None. LEFT LOWER EXTREMITY Common Femoral Vein: No evidence of thrombus. Normal compressibility, respiratory phasicity and response to augmentation. Saphenofemoral Junction: No evidence of thrombus. Normal compressibility and flow on color Doppler imaging. Profunda Femoral Vein: No evidence of thrombus. Normal compressibility and flow on color Doppler imaging. Femoral Vein: No evidence of  thrombus. Normal compressibility, respiratory phasicity and response to augmentation. Popliteal Vein: No evidence of thrombus. Normal compressibility, respiratory phasicity and response to augmentation. Calf Veins: No evidence of thrombus. Normal compressibility and flow on color Doppler imaging. Superficial Great Saphenous Vein: No evidence of thrombus. Normal compressibility. Venous Reflux:  None. Other Findings:  None. IMPRESSION: No evidence of deep venous thrombosis in either lower extremity. Electronically Signed   By: Jacob Moores M.D.   On: 05/22/2022 18:19   DG Chest Port 1 View  Result Date: 05/22/2022 CLINICAL DATA:  76 year old female with history of chest pain. EXAM: PORTABLE CHEST 1 VIEW COMPARISON:  Chest  x-ray 12/31/2021. FINDINGS: Lung volumes are normal. Diffuse interstitial prominence and widespread peribronchial cuffing. No consolidative airspace disease. No pleural effusions. No pneumothorax. Calcified granulomas in the right upper lobe again noted. No other suspicious appearing pulmonary nodule or mass noted. Pulmonary vasculature and the cardiomediastinal silhouette are within normal limits. Atherosclerosis in the thoracic aorta. Extensive calcifications of the mitral annulus. Status post left shoulder arthroplasty. Electronic device projecting over the left hemithorax, likely an implantable loop recorder. IMPRESSION: 1. Diffuse interstitial prominence and peribronchial cuffing, concerning for an acute bronchitis. 2. Aortic atherosclerosis. Electronically Signed   By: Trudie Reed M.D.   On: 05/22/2022 10:19    EKG: Rapid atrial fibrillation atrial flutter rate of 140 narrow complex  ASSESSMENT AND PLAN:  Atrial fibs atrial flutter rapid ventricular response Tachycardia Chest pain Hypertension Diabetes COPD Anemia . Plan Recommend anticoagulation with heparin recommend rate control switch from diltiazem to metoprolol Hypertension reasonably controlled would recommend metoprolol ACE or ARB Chest pain possible angina consider ischemic workup Diabetes type 2 continue current diabetes medications and treatment Recommend antiarrhythmic with amiodarone metoprolol discontinue diltiazem COPD mild to moderate continue inhalers as necessary Echocardiogram for assessment of left ventricular function and valvular structures with A-fib Patient has significant CHADS2 score we will transition from heparin to probably Eliquis upon discharge No invasive procedures planned at this point    Signed: Alwyn Pea MD, 05/22/2022, 8:08 PM

## 2022-05-22 NOTE — ED Notes (Signed)
MD at bedside. 

## 2022-05-22 NOTE — Consult Note (Signed)
ANTICOAGULATION CONSULT NOTE - Initial Consult  Pharmacy Consult for Heparin infusion Indication: atrial fibrillation  Allergies  Allergen Reactions   Cephalosporins Itching   Codeine Other (See Comments)    Extreme drowsiness   Contrast Media [Iodinated Contrast Media] Hives and Swelling    Facial swelling   Oxycodone Other (See Comments)    Extreme drowsiness    Patient Measurements: Height: 5\' 2"  (157.5 cm) Weight: 67.1 kg (148 lb) IBW/kg (Calculated) : 50.1 Heparin Dosing Weight: 63.9kg  Vital Signs: Temp: 98.9 F (37.2 C) (12/17 1007) Temp Source: Oral (12/17 1007) BP: 134/71 (12/17 1200) Pulse Rate: 103 (12/17 1200)  Labs: Recent Labs    05/22/22 1010 05/22/22 1202  HGB 13.6  --   HCT 42.5  --   PLT 163  --   APTT  --  37*  LABPROT  --  15.6*  INR  --  1.3*  CREATININE 0.59  --   TROPONINIHS 8 17    Estimated Creatinine Clearance: 52.9 mL/min (by C-G formula based on SCr of 0.59 mg/dL).   Medical History: Past Medical History:  Diagnosis Date   Anemia    Asthma    COPD (chronic obstructive pulmonary disease) (HCC)    Diabetes mellitus without complication (HCC)    type 2   DVT (deep venous thrombosis) (HCC) 1980   GERD (gastroesophageal reflux disease)    History of hiatal hernia    Hypertension    Hypothyroidism     Medications:  No AC prior to admission  Assessment: Patient admitted with new onset Afib. 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 No other hx of anticoagulation use noted. Pharmacy consulted to manage heparin infusion.   Goal of Therapy:  Heparin level 0.3-0.7 units/ml Monitor platelets by anticoagulation protocol: Yes   Plan:  Give 3000 units bolus x 1 Start heparin infusion at 900 units/hr Check anti-Xa level in 8 hours and daily while on heparin Continue to monitor H&H and platelets  Hawa Henly Rodriguez-Guzman PharmD, BCPS 05/22/2022 1:01 PM

## 2022-05-23 ENCOUNTER — Inpatient Hospital Stay
Admit: 2022-05-23 | Discharge: 2022-05-23 | Disposition: A | Discharge status: Home / Self Care | Payer: Medicare (Managed Care) | Attending: Internal Medicine | Admitting: Internal Medicine

## 2022-05-23 ENCOUNTER — Encounter: Payer: Self-pay | Admitting: Internal Medicine

## 2022-05-23 ENCOUNTER — Inpatient Hospital Stay: Payer: Medicare (Managed Care)

## 2022-05-23 ENCOUNTER — Other Ambulatory Visit (HOSPITAL_COMMUNITY): Payer: Self-pay

## 2022-05-23 DIAGNOSIS — J019 Acute sinusitis, unspecified: Secondary | ICD-10-CM | POA: Diagnosis present

## 2022-05-23 DIAGNOSIS — I4891 Unspecified atrial fibrillation: Secondary | ICD-10-CM | POA: Diagnosis not present

## 2022-05-23 LAB — CBC
HCT: 34.4 % — ABNORMAL LOW (ref 36.0–46.0)
Hemoglobin: 11.1 g/dL — ABNORMAL LOW (ref 12.0–15.0)
MCH: 30.2 pg (ref 26.0–34.0)
MCHC: 32.3 g/dL (ref 30.0–36.0)
MCV: 93.5 fL (ref 80.0–100.0)
Platelets: 152 10*3/uL (ref 150–400)
RBC: 3.68 MIL/uL — ABNORMAL LOW (ref 3.87–5.11)
RDW: 13.5 % (ref 11.5–15.5)
WBC: 6.3 10*3/uL (ref 4.0–10.5)
nRBC: 0 % (ref 0.0–0.2)

## 2022-05-23 LAB — BASIC METABOLIC PANEL
Anion gap: 9 (ref 5–15)
BUN: 27 mg/dL — ABNORMAL HIGH (ref 8–23)
CO2: 21 mmol/L — ABNORMAL LOW (ref 22–32)
Calcium: 8.5 mg/dL — ABNORMAL LOW (ref 8.9–10.3)
Chloride: 100 mmol/L (ref 98–111)
Creatinine, Ser: 0.95 mg/dL (ref 0.44–1.00)
GFR, Estimated: >60 mL/min (ref >60)
Glucose, Bld: 296 mg/dL — ABNORMAL HIGH (ref 70–99)
Potassium: 4.5 mmol/L (ref 3.5–5.1)
Sodium: 130 mmol/L — ABNORMAL LOW (ref 135–145)

## 2022-05-23 LAB — GLUCOSE, CAPILLARY: Glucose-Capillary: 139 mg/dL — ABNORMAL HIGH (ref 70–99)

## 2022-05-23 LAB — LIPID PANEL
Cholesterol: 186 mg/dL (ref 0–200)
HDL: 32 mg/dL — ABNORMAL LOW (ref >40)
LDL Cholesterol: 123 mg/dL — ABNORMAL HIGH (ref 0–99)
Total CHOL/HDL Ratio: 5.8 RATIO
Triglycerides: 154 mg/dL — ABNORMAL HIGH (ref <150)
VLDL: 31 mg/dL (ref 0–40)

## 2022-05-23 LAB — CBG MONITORING, ED
Glucose-Capillary: 295 mg/dL — ABNORMAL HIGH (ref 70–99)
Glucose-Capillary: 300 mg/dL — ABNORMAL HIGH (ref 70–99)
Glucose-Capillary: 147 mg/dL — ABNORMAL HIGH (ref 70–99)

## 2022-05-23 LAB — TROPONIN I (HIGH SENSITIVITY): Troponin I (High Sensitivity): 15 ng/L (ref <18)

## 2022-05-23 LAB — HEPARIN LEVEL (UNFRACTIONATED): Heparin Unfractionated: 0.4 IU/mL (ref 0.30–0.70)

## 2022-05-23 LAB — HEMOGLOBIN A1C
Hgb A1c MFr Bld: 7.4 % — ABNORMAL HIGH (ref 4.8–5.6)
Mean Plasma Glucose: 166 mg/dL

## 2022-05-23 MED ORDER — IOHEXOL 350 MG/ML SOLN
75.0000 mL | Freq: Once | INTRAVENOUS | Status: AC | PRN
  Administered 2022-05-23: 75 mL via INTRAVENOUS

## 2022-05-23 MED ORDER — AMOXICILLIN 500 MG PO CAPS
500.0000 mg | ORAL_CAPSULE | Freq: Three times a day (TID) | ORAL | Status: AC
  Administered 2022-05-23 – 2022-05-24 (×5): 500 mg via ORAL
  Filled 2022-05-23 (×6): qty 1

## 2022-05-23 MED ORDER — AMIODARONE HCL 200 MG PO TABS
200.0000 mg | ORAL_TABLET | Freq: Two times a day (BID) | ORAL | Status: DC
  Administered 2022-05-23 (×2): 200 mg via ORAL
  Filled 2022-05-23 (×2): qty 1

## 2022-05-23 MED ORDER — LORATADINE 10 MG PO TABS
10.0000 mg | ORAL_TABLET | Freq: Every day | ORAL | Status: DC
  Administered 2022-05-23 – 2022-05-24 (×2): 10 mg via ORAL
  Filled 2022-05-23 (×2): qty 1

## 2022-05-23 MED ORDER — SALINE SPRAY 0.65 % NA SOLN: 1.0000 | NASAL | Status: DC | PRN

## 2022-05-23 MED ORDER — APIXABAN 5 MG PO TABS
5.0000 mg | ORAL_TABLET | Freq: Two times a day (BID) | ORAL | Status: DC
  Administered 2022-05-23 – 2022-05-24 (×4): 5 mg via ORAL
  Filled 2022-05-23 (×4): qty 1

## 2022-05-23 MED ORDER — FLUTICASONE PROPIONATE 50 MCG/ACT NA SUSP
2.0000 | Freq: Every day | NASAL | Status: DC
  Administered 2022-05-23 – 2022-05-24 (×2): 2 via NASAL
  Filled 2022-05-23 (×2): qty 16

## 2022-05-23 NOTE — Inpatient Diabetes Management (Signed)
Inpatient Diabetes Program Recommendations  AACE/ADA: New Consensus Statement on Inpatient Glycemic Control (2015)  Target Ranges:  Prepandial:   less than 140 mg/dL      Peak postprandial:   less than 180 mg/dL (1-2 hours)      Critically ill patients:  140 - 180 mg/dL   Lab Results  Component Value Date   GLUCAP 300 (H) 05/23/2022    Review of Glycemic Control  Diabetes history: DM2 Outpatient Diabetes medications: None Current orders for Inpatient glycemic control: Novolog 0-9 units tid, 0-5 units hs  Inpatient Diabetes Program Recommendations:   A1c 7.8 on 02/16/22 Received Solumedrol 40 mg x 2 yesterday and CBGs have increase to currently 300. Spoke with RN Neomia Glass and patient has refused her insulin correction scale. Spoke with patient and patient states she is afraid of side effects of insulin and she normally does natural things like adding cinnamon to apple sauce to decrease blood sugars without taking any medication. Reviewed with patient side effect of Hypoglycemia and that she in on the lower scale of coverage and she is covered if CBG does happen to decrease to hypoglycemia. Patient agrees to take the Novolog correction for the CBG of 300 but does not agree to take any further doses. Discussed with Neomia Glass RN and plans to cover 300 CBG.  Thank you, [name]. Druanne Bosques, RN, MSN, CDE  Diabetes Coordinator Inpatient Glycemic Control Team Team Pager [phone] (8am-5pm) 05/23/2022 12:10 PM

## 2022-05-23 NOTE — ED Notes (Signed)
Patient provided with personally care products in order to wash up.

## 2022-05-23 NOTE — ED Notes (Signed)
Assisted patient to the commode

## 2022-05-23 NOTE — Consult Note (Signed)
ANTICOAGULATION CONSULT NOTE  Pharmacy Consult for Heparin infusion Indication: atrial fibrillation  Allergies  Allergen Reactions   Cephalosporins Itching   Codeine Other (See Comments)    Extreme drowsiness   Contrast Media [Iodinated Contrast Media] Hives and Swelling    Facial swelling   Oxycodone Other (See Comments)    Extreme drowsiness    Patient Measurements: Height: 5\' 2"  (157.5 cm) Weight: 67.1 kg (148 lb) IBW/kg (Calculated) : 50.1 Heparin Dosing Weight: 63.9kg  Vital Signs: Temp: 97.4 F (36.3 C) (12/18 0547) BP: 119/64 (12/18 0600) Pulse Rate: 64 (12/18 0600)  Labs: Recent Labs    05/22/22 1010 05/22/22 1202 05/22/22 1831 05/22/22 2139 05/23/22 0554  HGB 13.6  --   --   --  11.1*  HCT 42.5  --   --   --  34.4*  PLT 163  --   --   --  152  APTT  --  37*  --   --   --   LABPROT  --  15.6*  --   --   --   INR  --  1.3*  --   --   --   HEPARINUNFRC  --   --  0.15*  --  0.40  CREATININE 0.59  --   --   --  0.95  TROPONINIHS 8 17 18* 17 15     Estimated Creatinine Clearance: 44.5 mL/min (by C-G formula based on SCr of 0.95 mg/dL).   Medical History: Past Medical History:  Diagnosis Date   Anemia    Asthma    COPD (chronic obstructive pulmonary disease) (HCC)    Diabetes mellitus without complication (HCC)    type 2   DVT (deep venous thrombosis) (HCC) 1980   GERD (gastroesophageal reflux disease)    History of hiatal hernia    Hypertension    Hypothyroidism     Medications:  No AC prior to admission  Assessment: Patient admitted with new onset Afib. 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 No other hx of anticoagulation use noted. Pharmacy consulted to manage heparin infusion.   Goal of Therapy:  Heparin level 0.3-0.7 units/ml Monitor platelets by anticoagulation protocol: Yes   12/18 0554 HL 0.40, therapeutic x 1  Plan:  Continue heparin infusion at 1100 units/hr Recheck HL in 8 hr to confirm Continue to monitor H&H and  platelets  1/19, PharmD, [facility] 05/23/2022 6:40 AM

## 2022-05-23 NOTE — Assessment & Plan Note (Signed)
Cardiology plans for nuclear stress test in AM tomorrow.

## 2022-05-23 NOTE — Assessment & Plan Note (Signed)
Resume outpatient amoxicillin to complete course. Supportive care with allergy medications and nasal irrigation.

## 2022-05-23 NOTE — Assessment & Plan Note (Signed)
Not exacerbated. Bronchodilators.

## 2022-05-23 NOTE — Consult Note (Signed)
ANTICOAGULATION CONSULT NOTE  Pharmacy Consult for Apixaban Indication: atrial fibrillation  Allergies  Allergen Reactions   Cephalosporins Itching   Codeine Other (See Comments)    Extreme drowsiness   Contrast Media [Iodinated Contrast Media] Hives and Swelling    Facial swelling   Oxycodone Other (See Comments)    Extreme drowsiness    Patient Measurements: Height: 5\' 2"  (157.5 cm) Weight: 67.1 kg (148 lb) IBW/kg (Calculated) : 50.1 Heparin Dosing Weight: 63.9kg  Vital Signs: Temp: 98.1 F (36.7 C) (12/18 1027) Temp Source: Oral (12/18 1027) BP: 149/63 (12/18 1027) Pulse Rate: 74 (12/18 1027)  Labs: Recent Labs    05/22/22 1010 05/22/22 1202 05/22/22 1831 05/22/22 2139 05/23/22 0554  HGB 13.6  --   --   --  11.1*  HCT 42.5  --   --   --  34.4*  PLT 163  --   --   --  152  APTT  --  37*  --   --   --   LABPROT  --  15.6*  --   --   --   INR  --  1.3*  --   --   --   HEPARINUNFRC  --   --  0.15*  --  0.40  CREATININE 0.59  --   --   --  0.95  TROPONINIHS 8 17 18* 17 15     Estimated Creatinine Clearance: 44.5 mL/min (by C-G formula based on SCr of 0.95 mg/dL).   Medical History: Past Medical History:  Diagnosis Date   Anemia    Asthma    COPD (chronic obstructive pulmonary disease) (HCC)    Diabetes mellitus without complication (HCC)    type 2   DVT (deep venous thrombosis) (HCC) 1980   GERD (gastroesophageal reflux disease)    History of hiatal hernia    Hypertension    Hypothyroidism     Medications:  No AC prior to admission  Assessment: Patient admitted with new onset Afib. 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 No other hx of anticoagulation use noted. Initially started on heparin infusion. Pharmacy consulted to transition to new eliquis.   Goal of Therapy:  Heparin level 0.3-0.7 units/ml Monitor platelets by anticoagulation protocol: Yes   12/18 0554 HL 0.40, therapeutic x 1  Plan:  Discontinue heparin infusion  Initiate  apixaban 5 mg BID   1/19, PharmD, BCPS Clinical Pharmacist   05/23/2022 10:32 AM

## 2022-05-23 NOTE — ED Notes (Signed)
Delayed in administering the cholecaliferol 57mcg/mL due to waiting on the medication to arrive from the pharmacy.

## 2022-05-23 NOTE — Assessment & Plan Note (Signed)
Home Lasix continued. Enalapril held on admission to allow BP room for HR control. PRN IV hydralazine.

## 2022-05-23 NOTE — Assessment & Plan Note (Addendum)
Uncontrolled, A1c in Sept this year 7.8% Now hyperglycemic around 300 after steroids to pretreat for IV contrast on admission yesterday Sliding scale Novolog. Patient has profound hyperglycemia today after given steroids to prevent allergic reaction to contrast. Patient adamantly refusing to be given any insulin despite counseling provided on importance of glycemic control.

## 2022-05-23 NOTE — ED Notes (Signed)
Patient to have a stress test in the morning. Needs to be NPO after midnight.

## 2022-05-23 NOTE — ED Notes (Signed)
Pt nurse from [facility] they provide transportation at time of discharge.

## 2022-05-23 NOTE — Progress Notes (Addendum)
Progress Note   Patient: [name] WUJ:[id number] DOB: 1945/07/15 DOA: 05/22/2022     1 DOS: the patient was seen and examined on 05/23/2022   Brief hospital course: [name] is a 76 y.o. female with medical history significant of hypertension, diabetes mellitus, COPD, asthma, GERD, hypothyroidism, depression, remote DVT 1980 (not on anticoagulants anymore), iron deficiency anemia, who presented to the ED on 05/22/2022 for evaluation of chest pain.   She was found to be in A-fib RVR with HR 130's to 150's. She was started on Cardizem drip in the ED.  Admitted to medicine service.  Cardiology consulted.  Cardiology recommended amiodarone drip. Cardizem drip was discontinued. Oral metoprolol tartrate was started for baseline rate control.  12/18 transitioned to oral amiodarone.  HR's controlled. Cardiology plans for nuclear stress test in AM.  Assessment and Plan: * Atrial fibrillation with RVR (HCC) Mgmt per Cardiology, see their recommendations. Transitioned to PO amiodarone today. Continue PO Lopressor per orders. Closely monitor BP. Telemetry Monitor & replace electrolytes.  Chest pain Cardiology plans for nuclear stress test in AM tomorrow.  HTN (hypertension) Home Lasix continued. Enalapril held on admission to allow BP room for HR control. PRN IV hydralazine.  Diabetes mellitus without complication (HCC) Uncontrolled, A1c in Sept this year 7.8% Now hyperglycemic around 300 after steroids to pretreat for IV contrast on admission yesterday Sliding scale Novolog. Patient has profound hyperglycemia today after given steroids to prevent allergic reaction to contrast. Patient adamantly refusing to be given any insulin despite counseling provided on importance of glycemic control.   COPD (chronic obstructive pulmonary disease) (HCC) Not exacerbated. Bronchodilators.  Hypothyroidism Synthroid  Iron deficiency anemia Hbg stable. Monitor CBC. Continue iron  supplement.   Acute sinusitis Resume outpatient amoxicillin to complete course. Supportive care with allergy medications and nasal irrigation.        Subjective: Pt seen in ED holding for a bed. Reports feeling well.  Asks to have ENT come look at her ears.  Has been with a sinus infection recently.  Continues to refuse insulin and does not entertain any education about importance of controlling sugars.  Reports concern that [facility] has, in the past, had delays in getting new medications to her.  She is concerned she won't have access to any new meds prescribed this admission.  Attempted to provide reassurance, but it was not well received.     Physical Exam: Vitals:   05/23/22 1020 05/23/22 1027 05/23/22 1030 05/23/22 1340  BP: (!) 149/63 (!) 149/63 (!) 139/57 139/63  Pulse: 74 74 71 75  Resp:  18  16  Temp:  98.1 F (36.7 C)  98.2 F (36.8 C)  TempSrc:  Oral    SpO2: 97% 97% 96% 97%  Weight:      Height:       General exam: awake, alert, no acute distress HEENT: atraumatic, clear conjunctiva, anicteric sclera, moist mucus membranes, hearing grossly normal  Respiratory system: CTAB, no wheezes, rales or rhonchi, normal respiratory effort. Cardiovascular system: normal S1/S2, RRR, no JVD, murmurs, rubs, gallops, no pedal edema.   Gastrointestinal system: soft, NT, ND, no HSM felt, +bowel sounds. Central nervous system: A&O x4. no gross focal neurologic deficits, normal speech Extremities: moves all, no edema, normal tone Skin: dry, intact, normal temperature, normal color, No rashes, lesions or ulcers Psychiatry: normal mood, congruent affect, judgement and insight appear normal   Data Reviewed:  Labs notable for Na 130, bicarb 21, glucose 296, BUN 27, Ca 8.5.  HDL  32, LDL 132, TG's 154.  Hbg 11.1  Family Communication: none  Disposition: Status is: Inpatient Remains inpatient appropriate because: ongoing cardiology evaluation   Planned Discharge Destination:  Home    Time spent: 45 minutes  Author: Pennie Banter, DO 05/23/2022 3:54 PM  For on call review [email or web address].

## 2022-05-23 NOTE — Assessment & Plan Note (Signed)
Synthroid 

## 2022-05-23 NOTE — Assessment & Plan Note (Signed)
Paroxysmal A-fib, new onset Mgmt per Cardiology, see their recommendations. Transitioned from drip to PO amiodarone. Continue PO Lopressor. Closely monitor BP. Telemetry shows HR's controlled. Monitor & replace electrolytes. Normal Myoview stress test this AM. Cariology follow up as instructed.

## 2022-05-23 NOTE — Assessment & Plan Note (Signed)
Hbg stable.  Monitor CBC. Continue iron supplement. 

## 2022-05-23 NOTE — ED Notes (Signed)
Patient wanted to do without her oxygen for a bit. Patient has been doing well off of oxygen and has been staying in the mid 90% range.

## 2022-05-23 NOTE — Hospital Course (Signed)
[name] is a 76 y.o. female with medical history significant of hypertension, diabetes mellitus, COPD, asthma, GERD, hypothyroidism, depression, remote DVT 1980 (not on anticoagulants anymore), iron deficiency anemia, who presented to the ED on 05/22/2022 for evaluation of chest pain.   She was found to be in A-fib RVR with HR 130's to 150's. She was started on Cardizem drip in the ED.  Admitted to medicine service.  Cardiology consulted.  Cardiology recommended amiodarone drip. Cardizem drip was discontinued. Oral metoprolol tartrate was started for baseline rate control.  12/18 transitioned to oral amiodarone.  HR's controlled. Cardiology plans for nuclear stress test in AM.

## 2022-05-23 NOTE — Progress Notes (Signed)
[facility] Cardiology    SUBJECTIVE: 76 year old female presented with palpitations tachycardia found to be in atrial fibrillation flutter rapid ventricular response patient gives a history of having had a Linq device placed several years ago in [place] and surgery to identify arrhythmias and none were ever documented now presented with atrial flutter rapid ventricular sponsor she is feels better now rate controlled back to sinus no further chest pain   Vitals:   05/23/22 0600 05/23/22 0630 05/23/22 0645 05/23/22 0700  BP: 119/64 103/61  101/63  Pulse: 64 65 66 66  Resp:    18  Temp:      TempSrc:      SpO2: 97% 97% 97% 97%  Weight:      Height:        No intake or output data in the 24 hours ending 05/23/22 0831    PHYSICAL EXAM  General: Well developed, well nourished, in no acute distress HEENT:  Normocephalic and atramatic Neck:  No JVD.  Lungs: Clear bilaterally to auscultation and percussion. Heart: HRRR . Normal S1 and S2 without gallops or murmurs.  Abdomen: Bowel sounds are positive, abdomen soft and non-tender  Msk:  Back normal, normal gait. Normal strength and tone for age. Extremities: No clubbing, cyanosis or edema.   Neuro: Alert and oriented X 3. Psych:  Good affect, responds appropriately   LABS: Basic Metabolic Panel: Recent Labs    05/22/22 1010 05/23/22 0554  NA 135 130*  K 4.4 4.5  CL 103 100  CO2 20* 21*  GLUCOSE 201* 296*  BUN 15 27*  CREATININE 0.59 0.95  CALCIUM 9.2 8.5*   Liver Function Tests: Recent Labs    05/22/22 1010  AST 47*  ALT 25  ALKPHOS 44  BILITOT 1.1  PROT 8.1  ALBUMIN 4.0   Recent Labs    05/22/22 1010  LIPASE 40   CBC: Recent Labs    05/22/22 1010 05/23/22 0554  WBC 9.1 6.3  HGB 13.6 11.1*  HCT 42.5 34.4*  MCV 94.2 93.5  PLT 163 152   Cardiac Enzymes: No results for input(s): "CKTOTAL", "CKMB", "CKMBINDEX", "TROPONINI" in the last 72 hours. BNP: Invalid input(s): "POCBNP" D-Dimer: Recent Labs     05/22/22 1200  DDIMER 1.26*   Hemoglobin A1C: No results for input(s): "HGBA1C" in the last 72 hours. Fasting Lipid Panel: Recent Labs    05/23/22 0554  CHOL 186  HDL 32*  LDLCALC 123*  TRIG 154*  CHOLHDL 5.8   Thyroid Function Tests: Recent Labs    05/22/22 1010  TSH 3.119   Anemia Panel: No results for input(s): "VITAMINB12", "FOLATE", "FERRITIN", "TIBC", "IRON", "RETICCTPCT" in the last 72 hours.  CT Angio Chest PE W/Cm &/Or Wo Cm  Result Date: 05/23/2022 CLINICAL DATA:  Chest pain, history of DVT, evaluate for PE EXAM: CT ANGIOGRAPHY CHEST WITH CONTRAST TECHNIQUE: Multidetector CT imaging of the chest was performed using the standard protocol during bolus administration of intravenous contrast. Multiplanar CT image reconstructions and MIPs were obtained to evaluate the vascular anatomy. RADIATION DOSE REDUCTION: This exam was performed according to the departmental dose-optimization program which includes automated exposure control, adjustment of the mA and/or kV according to patient size and/or use of iterative reconstruction technique. CONTRAST:  62mL OMNIPAQUE IOHEXOL 350 MG/ML SOLN COMPARISON:  Chest radiograph dated 05/22/2022. CT chest dated 10/05/2021. FINDINGS: Cardiovascular: Satisfactory opacification of the bilateral pulmonary arteries to the segmental level. No evidence of pulmonary embolism. Although not tailored for evaluation of the thoracic  aorta, there is no evidence of thoracic aortic aneurysm or dissection. Atherosclerotic calcifications of the aortic arch. The heart is top-normal in size. No pericardial effusion. Mitral valve annular calcifications. Coronary atherosclerosis of the LAD and left circumflex. Mediastinum/Nodes: Small left supraclavicular nodes measuring up to 8 mm, within the upper limits of normal. Lungs/Pleura: Mild interstitial ground-glass opacities in the lungs bilaterally, suggesting mild interstitial edema. Moderate right pleural effusion.  Associated right lower lobe atelectasis. No pneumothorax. Upper Abdomen: Visualized upper abdomen is notable for a mildly nodular hepatic contour, a small hiatal hernia, and vascular calcifications. Musculoskeletal: Mild degenerative changes of the visualized thoracolumbar spine. Review of the MIP images confirms the above findings. IMPRESSION: No evidence of pulmonary embolism. Mild interstitial edema with moderate right pleural effusion. Associated right lower lobe atelectasis. Aortic Atherosclerosis (ICD10-I70.0). Electronically Signed   By: Julian Hy M.D.   On: 05/23/2022 02:20   US Venous Img Lower Bilateral (DVT)  Result Date: 05/22/2022 CLINICAL DATA:  Positive D-dimer, right knee surgery on March 14, 2022 EXAM: BILATERAL LOWER EXTREMITY VENOUS DOPPLER ULTRASOUND TECHNIQUE: Gray-scale sonography with graded compression, as well as color Doppler and duplex ultrasound were performed to evaluate the lower extremity deep venous systems from the level of the common femoral vein and including the common femoral, femoral, profunda femoral, popliteal and calf veins including the posterior tibial, peroneal and gastrocnemius veins when visible. The superficial great saphenous vein was also interrogated. Spectral Doppler was utilized to evaluate flow at rest and with distal augmentation maneuvers in the common femoral, femoral and popliteal veins. COMPARISON:  Right lower extremity venous Doppler ultrasound September 22nd 2023 FINDINGS: RIGHT LOWER EXTREMITY Common Femoral Vein: No evidence of thrombus. Normal compressibility, respiratory phasicity and response to augmentation. Saphenofemoral Junction: No evidence of thrombus. Normal compressibility and flow on color Doppler imaging. Profunda Femoral Vein: No evidence of thrombus. Normal compressibility and flow on color Doppler imaging. Femoral Vein: No evidence of thrombus. Normal compressibility, respiratory phasicity and response to augmentation.  Popliteal Vein: No evidence of thrombus. Normal compressibility, respiratory phasicity and response to augmentation. Calf Veins: No evidence of thrombus. Normal compressibility and flow on color Doppler imaging. Superficial Great Saphenous Vein: No evidence of thrombus. Normal compressibility. Venous Reflux:  None. Other Findings:  None. LEFT LOWER EXTREMITY Common Femoral Vein: No evidence of thrombus. Normal compressibility, respiratory phasicity and response to augmentation. Saphenofemoral Junction: No evidence of thrombus. Normal compressibility and flow on color Doppler imaging. Profunda Femoral Vein: No evidence of thrombus. Normal compressibility and flow on color Doppler imaging. Femoral Vein: No evidence of thrombus. Normal compressibility, respiratory phasicity and response to augmentation. Popliteal Vein: No evidence of thrombus. Normal compressibility, respiratory phasicity and response to augmentation. Calf Veins: No evidence of thrombus. Normal compressibility and flow on color Doppler imaging. Superficial Great Saphenous Vein: No evidence of thrombus. Normal compressibility. Venous Reflux:  None. Other Findings:  None. IMPRESSION: No evidence of deep venous thrombosis in either lower extremity. Electronically Signed   By: Beryle Flock M.D.   On: 05/22/2022 18:19   DG Chest Port 1 View  Result Date: 05/22/2022 CLINICAL DATA:  76 year old female with history of chest pain. EXAM: PORTABLE CHEST 1 VIEW COMPARISON:  Chest x-ray 12/31/2021. FINDINGS: Lung volumes are normal. Diffuse interstitial prominence and widespread peribronchial cuffing. No consolidative airspace disease. No pleural effusions. No pneumothorax. Calcified granulomas in the right upper lobe again noted. No other suspicious appearing pulmonary nodule or mass noted. Pulmonary vasculature and the cardiomediastinal silhouette are within normal limits.  Atherosclerosis in the thoracic aorta. Extensive calcifications of the mitral  annulus. Status post left shoulder arthroplasty. Electronic device projecting over the left hemithorax, likely an implantable loop recorder. IMPRESSION: 1. Diffuse interstitial prominence and peribronchial cuffing, concerning for an acute bronchitis. 2. Aortic atherosclerosis. Electronically Signed   By: Trudie Reed M.D.   On: 05/22/2022 10:19     Echo pending  TELEMETRY: Normal sinus rhythm rate of 75 specific ST-T wave changes:  ASSESSMENT AND PLAN:  Principal Problem:   Atrial fibrillation with RVR (HCC) Active Problems:   Chest pain   HTN (hypertension)   Diabetes mellitus without complication (HCC)   COPD (chronic obstructive pulmonary disease) (HCC)   Hypothyroidism   Iron deficiency anemia    Plan Atrial fibrillation atrial flutter with rapid ventricular response subsequently converted to sinus rhythm at 70 will transition to p.o. amniodarone Continue metoprolol therapy for rate control and blood pressure Chest pain unclear as anginal would recommend functional study Lexiscan Myoview Mild to moderate COPD continue inhalers as necessary shortness of breath reasonable Continue diabetes management and control Hypertension reasonably manage continue metoprolol therapy   Alwyn Pea, MD 05/23/2022 8:31 AM

## 2022-05-23 NOTE — ED Notes (Signed)
Patient refused the insulin stating she will get it down when she gets home.

## 2022-05-23 NOTE — ED Notes (Signed)
Pt transported to and from CT on monitor with RN without issues.

## 2022-05-23 NOTE — Progress Notes (Signed)
*  PRELIMINARY RESULTS* Echocardiogram 2D Echocardiogram has been performed.  Cristela Blue 05/23/2022, 2:32 PM

## 2022-05-24 ENCOUNTER — Other Ambulatory Visit (HOSPITAL_COMMUNITY): Payer: Self-pay

## 2022-05-24 ENCOUNTER — Inpatient Hospital Stay: Payer: Medicare (Managed Care)

## 2022-05-24 DIAGNOSIS — I4891 Unspecified atrial fibrillation: Secondary | ICD-10-CM | POA: Diagnosis not present

## 2022-05-24 LAB — NM MYOCAR MULTI W/SPECT W/WALL MOTION / EF
Estimated workload: 1
Exercise duration (min): 1 min
Exercise duration (sec): 9 s
LV dias vol: 38 mL (ref 46–106)
LV sys vol: 7 mL
MPHR: 143 {beats}/min
Nuc Stress EF: 82 %
Peak HR: 86 {beats}/min
Percent HR: 60 %
Rest HR: 66 {beats}/min
Rest Nuclear Isotope Dose: 10.9 mCi
SDS: 1
SRS: 1
SSS: 1
ST Depression (mm): 0 mm
Stress Nuclear Isotope Dose: 32 mCi
TID: 0.48

## 2022-05-24 LAB — ECHOCARDIOGRAM COMPLETE
AR max vel: 3.07 cm2
AV Area VTI: 3.15 cm2
AV Area mean vel: 3.31 cm2
AV Mean grad: 4 mmHg
AV Peak grad: 7.2 mmHg
Ao pk vel: 1.34 m/s
Area-P 1/2: 1.99 cm2
Height: 62 in
MV VTI: 1.3 cm2
S' Lateral: 2.6 cm
Weight: 2368 oz

## 2022-05-24 LAB — BASIC METABOLIC PANEL
Anion gap: 7 (ref 5–15)
BUN: 36 mg/dL — ABNORMAL HIGH (ref 8–23)
CO2: 25 mmol/L (ref 22–32)
Calcium: 8.8 mg/dL — ABNORMAL LOW (ref 8.9–10.3)
Chloride: 107 mmol/L (ref 98–111)
Creatinine, Ser: 0.8 mg/dL (ref 0.44–1.00)
GFR, Estimated: >60 mL/min (ref >60)
Glucose, Bld: 139 mg/dL — ABNORMAL HIGH (ref 70–99)
Potassium: 3.9 mmol/L (ref 3.5–5.1)
Sodium: 139 mmol/L (ref 135–145)

## 2022-05-24 LAB — MAGNESIUM: Magnesium: 2.1 mg/dL (ref 1.7–2.4)

## 2022-05-24 LAB — GLUCOSE, CAPILLARY
Glucose-Capillary: 126 mg/dL — ABNORMAL HIGH (ref 70–99)
Glucose-Capillary: 155 mg/dL — ABNORMAL HIGH (ref 70–99)
Glucose-Capillary: 153 mg/dL — ABNORMAL HIGH (ref 70–99)

## 2022-05-24 MED ORDER — METOPROLOL TARTRATE 25 MG PO TABS: 25.0000 mg | ORAL_TABLET | Freq: Two times a day (BID) | ORAL | 0 refills | Status: DC

## 2022-05-24 MED ORDER — TECHNETIUM TC 99M TETROFOSMIN IV KIT
10.0000 | PACK | Freq: Once | INTRAVENOUS | Status: AC | PRN
  Administered 2022-05-24: 10.54 via INTRAVENOUS

## 2022-05-24 MED ORDER — ORAL CARE MOUTH RINSE: 15.0000 mL | OROMUCOSAL | Status: DC | PRN

## 2022-05-24 MED ORDER — METOPROLOL TARTRATE 25 MG PO TABS
25.0000 mg | ORAL_TABLET | Freq: Once | ORAL | Status: AC
  Administered 2022-05-24: 25 mg via ORAL
  Filled 2022-05-24: qty 1

## 2022-05-24 MED ORDER — ATORVASTATIN CALCIUM 20 MG PO TABS: 40.0000 mg | ORAL_TABLET | Freq: Every day | ORAL | Status: DC

## 2022-05-24 MED ORDER — LORATADINE 10 MG PO TABS: 10.0000 mg | ORAL_TABLET | Freq: Every day | ORAL | 0 refills | Status: DC

## 2022-05-24 MED ORDER — REGADENOSON 0.4 MG/5ML IV SOLN: 0.4000 mg | Freq: Once | INTRAVENOUS | Status: DC

## 2022-05-24 MED ORDER — APIXABAN 5 MG PO TABS: 5.0000 mg | ORAL_TABLET | Freq: Two times a day (BID) | ORAL | 0 refills | Status: DC

## 2022-05-24 MED ORDER — DOCUSATE SODIUM 100 MG PO CAPS
100.0000 mg | ORAL_CAPSULE | Freq: Two times a day (BID) | ORAL | Status: DC
  Administered 2022-05-24: 100 mg via ORAL
  Filled 2022-05-24: qty 1

## 2022-05-24 MED ORDER — METOPROLOL TARTRATE 25 MG PO TABS: 25.0000 mg | ORAL_TABLET | Freq: Two times a day (BID) | ORAL | Status: DC

## 2022-05-24 MED ORDER — MUCINEX 600 MG PO TB12: 600.0000 mg | ORAL_TABLET | Freq: Two times a day (BID) | ORAL | Status: DC | PRN

## 2022-05-24 MED ORDER — AMIODARONE HCL 200 MG PO TABS: ORAL_TABLET | ORAL | 0 refills | Status: DC

## 2022-05-24 MED ORDER — REGADENOSON 0.4 MG/5ML IV SOLN
0.4000 mg | Freq: Once | INTRAVENOUS | Status: AC
  Administered 2022-05-24: 0.4 mg via INTRAVENOUS

## 2022-05-24 MED ORDER — SALINE SPRAY 0.65 % NA SOLN: 1.0000 | NASAL | 0 refills | Status: DC | PRN

## 2022-05-24 MED ORDER — AMIODARONE HCL 200 MG PO TABS
200.0000 mg | ORAL_TABLET | Freq: Two times a day (BID) | ORAL | Status: DC
  Administered 2022-05-24 (×2): 200 mg via ORAL
  Filled 2022-05-24 (×2): qty 1

## 2022-05-24 MED ORDER — TECHNETIUM TC 99M TETROFOSMIN IV KIT
30.0000 | PACK | Freq: Once | INTRAVENOUS | Status: AC
  Administered 2022-05-24: 31.97 via INTRAVENOUS

## 2022-05-24 MED ORDER — AMIODARONE HCL 200 MG PO TABS: 200.0000 mg | ORAL_TABLET | Freq: Every day | ORAL | Status: DC

## 2022-05-24 NOTE — Plan of Care (Signed)

## 2022-05-24 NOTE — Progress Notes (Signed)
Order to discharge pt home.  Discharge instructions/AVS given to patient and son at bedside - reviewed and education provided as needed.    Per MD order, medications for tonight to be administered prior to discharge (amiodarone, amoxicillin, metoprolol, and Eliquis) so pt can start all medications on 12/20.  Home medications to be delivered by [facility].  Per son, medications have not arrived yet.    MD spoke with [facility] to ensure medication available at [facility].  Pt and son aware.  Educated pt and son on following up with [facility] for medications.  Pt to follow up with [facility] MD tomorrow morning, 12/20.

## 2022-05-24 NOTE — Progress Notes (Signed)
[facility] CLINIC CARDIOLOGY CONSULT NOTE       Patient ID: [name] MRN: [id number] DOB/AGE: 14-Jul-1945 76 y.o.  Admit date: 05/22/2022 Referring Physician Dr. Clyde Lundborg Primary Physician Pace Primary Cardiologist none Reason for Consultation AF RVR, chest pain   HPI: [name] is a 76yoF with a PMH of HTN, DM2 COPD, asthma, GERD, hypothyroidism, remote history of DVT (1980) who presented to [facility] ED 05/22/2022 with chest pain.  She was found to be in atrial fibrillation with RVR with rates in the 130s to 150s on presentation, converted to sinus rhythm after a diltiazem infusion, amiodarone infusion, and oral metoprolol.  She had a Lexiscan Myoview performed the morning of 12/19 for further evaluation of her chest discomfort.  Interval history: -Remains in sinus rhythm on telemetry, heart rates in the 60s to 80s -Reported some discomfort on her right anterior chest just above her right breast that is reproducible to palpation.   Review of systems complete and found to be negative unless listed above     Past Medical History:  Diagnosis Date   Anemia    Asthma    COPD (chronic obstructive pulmonary disease) (HCC)    Diabetes mellitus without complication (HCC)    type 2   DVT (deep venous thrombosis) (HCC) 1980   GERD (gastroesophageal reflux disease)    History of hiatal hernia    Hypertension    Hypothyroidism     Past Surgical History:  Procedure Laterality Date   CATARACT EXTRACTION Bilateral    CHOLECYSTECTOMY     COLONOSCOPY     ESOPHAGOGASTRODUODENOSCOPY (EGD) WITH PROPOFOL N/A 01/17/2022   Procedure: ESOPHAGOGASTRODUODENOSCOPY (EGD) WITH PROPOFOL;  Surgeon: Jaynie Collins, DO;  Location: [facility] ENDOSCOPY;  Service: Gastroenterology;  Laterality: N/A;   HUMERUS SURGERY Left    fracture (7 surgeries total)   MENISCUS REPAIR Right 2019   THYROIDECTOMY     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; reconstruction from hip bone   TONSILECTOMY, ADENOIDECTOMY,  BILATERAL MYRINGOTOMY AND TUBES     TOTAL KNEE ARTHROPLASTY Right 03/14/2022   Procedure: TOTAL KNEE ARTHROPLASTY;  Surgeon: Reinaldo Berber, MD;  Location: [facility] ORS;  Service: Orthopedics;  Laterality: Right;   TOTAL SHOULDER REPLACEMENT Left    TUBAL LIGATION     WRIST SURGERY Left    multiple fractures from falls (14 surgeries from wrist to shoulder)    Medications Prior to Admission  Medication Sig Dispense Refill Last Dose   acetaminophen (TYLENOL) 325 MG tablet Take 1,300 mg by mouth every 6 (six) hours as needed for moderate pain.   prn   albuterol (PROVENTIL) (2.5 MG/3ML) 0.083% nebulizer solution Take 2.5 mg by nebulization every 4 (four) hours as needed for wheezing or shortness of breath.   05/21/2022   amoxicillin (AMOXIL) 500 MG capsule Take 500 mg by mouth 3 (three) times daily.   05/21/2022   Ascorbic Acid (VITAMIN C CR) 1500 MG TBCR Take 1 tablet by mouth daily.   05/21/2022   atorvastatin (LIPITOR) 20 MG tablet Take 20 mg by mouth daily.      enalapril (VASOTEC) 20 MG tablet Take 20 mg by mouth daily.   05/21/2022   esomeprazole (NEXIUM) 40 MG capsule Take 40 mg by mouth 2 (two) times daily.   05/21/2022   ferrous sulfate 325 (65 FE) MG tablet Take 325 mg by mouth daily.   05/21/2022   furosemide (LASIX) 20 MG tablet Take 20 mg by mouth daily.   05/21/2022   ibuprofen (ADVIL)  200 MG tablet Take 400 mg by mouth every 6 (six) hours as needed for mild pain.   prn   ketoconazole (NIZORAL) 2 % shampoo Apply 1 Application topically 2 (two) times a week.   Past Week   ketotifen (ZADITOR) 0.025 % ophthalmic solution Place 1 drop into both eyes daily as needed.   05/21/2022   levothyroxine (SYNTHROID) 125 MCG tablet Take 125 mcg by mouth daily before breakfast.   05/21/2022   Magnesium 250 MG TABS Take 1 tablet by mouth daily.   05/21/2022   MUCINEX 600 MG 12 hr tablet Take 600 mg by mouth 2 (two) times daily.   05/21/2022   Omega-3 1000 MG CAPS Take 1 capsule by mouth daily.    05/21/2022   OXYGEN Inhale 2 L into the lungs at bedtime.   05/21/2022   potassium chloride (KLOR-CON M) 10 MEQ tablet Take 10 mEq by mouth daily.   05/21/2022   TURMERIC PO Take 1 capsule by mouth daily.   05/21/2022   VITAMIN D-VITAMIN K PO Take 4,000 Units by mouth daily.   05/21/2022   albuterol (VENTOLIN HFA) 108 (90 Base) MCG/ACT inhaler Inhale 2-4 puffs by mouth every 4 hours as needed for wheezing, cough, and/or shortness of breath 6.7 g 0 prn   benzonatate (TESSALON PERLES) 100 MG capsule Take 1 capsule (100 mg total) by mouth 3 (three) times daily as needed for cough. (Patient not taking: Reported on 05/22/2022) 30 capsule 0 Not Taking   busPIRone (BUSPAR) 5 MG tablet Take 5 mg by mouth 3 (three) times daily as needed.   prn   Carboxymeth-Glycerin-Polysorb (REFRESH OPTIVE ADVANCED) 0.5-1-0.5 % SOLN Apply 2 drops to eye daily.   prn   cyclobenzaprine (FLEXERIL) 5 MG tablet Take 1 tablet (5 mg total) by mouth 3 (three) times daily as needed for muscle spasms. (Patient not taking: Reported on 05/22/2022) 21 tablet 0 Not Taking   docusate sodium (COLACE) 100 MG capsule Take 1 capsule (100 mg total) by mouth 2 (two) times daily. (Patient not taking: Reported on 05/22/2022) 10 capsule 0 Not Taking   Emollient (EUCERIN) lotion Apply 1 Application topically as needed for dry skin.   prn   enoxaparin (LOVENOX) 40 MG/0.4ML injection Inject 0.4 mLs (40 mg total) into the skin daily for 14 days. 5.6 mL 0    fluticasone (FLONASE) 50 MCG/ACT nasal spray Place 2 sprays into both nostrils daily as needed for allergies or rhinitis.   prn   hydrocortisone cream 1 % Apply 1 Application topically 2 (two) times daily as needed for itching. (Patient not taking: Reported on 05/22/2022)   Not Taking   omeprazole (PRILOSEC) 20 MG capsule Take 20 mg by mouth daily. (Patient not taking: Reported on 05/22/2022)   Not Taking   traMADol (ULTRAM) 50 MG tablet Take 1 tablet (50 mg total) by mouth every 6 (six) hours as  needed for moderate pain. (Patient not taking: Reported on 05/22/2022) 30 tablet 0 Not Taking   Social History   Socioeconomic History   Marital status: Widowed    Spouse name: Not on file   Number of children: 6   Years of education: Not on file   Highest education level: Not on file  Occupational History   Not on file  Tobacco Use   Smoking status: Never   Smokeless tobacco: Never  Vaping Use   Vaping Use: Never used  Substance and Sexual Activity   Alcohol use: Never   Drug use: Never  Sexual activity: Not Currently  Other Topics Concern   Not on file  Social History Narrative   Lives in senior housing in [place] (apt[place]   Social Determinants of Health   Financial Resource Strain: Not on file  Food Insecurity: No Food Insecurity (05/23/2022)   Hunger Vital Sign    Worried About Running Out of Food in the Last Year: Never true    Ran Out of Food in the Last Year: Never true  Transportation Needs: No Transportation Needs (05/23/2022)   PRAPARE - Administrator, Civil Service (Medical): No    Lack of Transportation (Non-Medical): No  Physical Activity: Not on file  Stress: Not on file  Social Connections: Not on file  Intimate Partner Violence: Not At Risk (05/23/2022)   Humiliation, Afraid, Rape, and Kick questionnaire    Fear of Current or Ex-Partner: No    Emotionally Abused: No    Physically Abused: No    Sexually Abused: No    Family History  Problem Relation Age of Onset   Kidney disease Mother      No intake or output data in the 24 hours ending 05/24/22 1044  Vitals:   05/23/22 2005 05/24/22 0018 05/24/22 0459 05/24/22 0816  BP: (!) 142/62 (!) 122/57 (!) 133/49 (!) 139/54  Pulse: 66 69 70 66  Resp: Temp: 98.5 F (36.9 C) 98.6 F (37 C) 97.7 F (36.5 C) 98.4 F (36.9 C)  TempSrc: Oral Oral Oral   SpO2: 98% 93% 96% 95%  Weight:   69.5 kg   Height:    (1.575 m)     PHYSICAL EXAM General: Elderly Caucasian  female, well nourished, in no acute distress.  Sitting" in hospital bed. HEENT:  Normocephalic and atraumatic. Neck:  No JVD.  Lungs: Normal respiratory effort on room air. Clear bilaterally to auscultation. No wheezes, crackles, rhonchi.  Chest: Reproducible tenderness to palpation along her right lower chest wall. Heart: HRRR . Normal S1 and S2 without gallops or murmurs.  Abdomen: Non-distended appearing.  Msk: Normal strength and tone for age. Extremities: Warm and well perfused. No clubbing, cyanosis.  No peripheral edema.  Neuro: Alert and oriented X 3. Psych:  Answers questions appropriately.   Labs: Basic Metabolic Panel: Recent Labs    05/23/22 0554 05/24/22 0609  NA 130* 139  K 4.5 3.9  CL 100 107  CO2 21* 25  GLUCOSE 296* 139*  BUN 27* 36*  CREATININE 0.95 0.80  CALCIUM 8.5* 8.8*  MG  --  2.1   Liver Function Tests: Recent Labs    05/22/22 1010  AST 47*  ALT 25  ALKPHOS 44  BILITOT 1.1  PROT 8.1  ALBUMIN 4.0   Recent Labs    05/22/22 1010  LIPASE 40   CBC: Recent Labs    05/22/22 1010 05/23/22 0554  WBC 9.1 6.3  HGB 13.6 11.1*  HCT 42.5 34.4*  MCV 94.2 93.5  PLT 163 152   Cardiac Enzymes: Recent Labs    05/22/22 1831 05/22/22 2139 05/23/22 0554  TROPONINIHS 18* 17 15   BNP: Recent Labs    05/22/22 1010  BNP 212.9*   D-Dimer: Recent Labs    05/22/22 1200  DDIMER 1.26*   Hemoglobin A1C: Recent Labs    05/22/22 1831  HGBA1C 7.4*   Fasting Lipid Panel: Recent Labs    05/23/22 0554  CHOL 186  HDL 32*  LDLCALC 123*  TRIG 154*  CHOLHDL 5.8  Thyroid Function Tests: Recent Labs    05/22/22 1010  TSH 3.119   Anemia Panel: No results for input(s): "VITAMINB12", "FOLATE", "FERRITIN", "TIBC", "IRON", "RETICCTPCT" in the last 72 hours.   Radiology: ECHOCARDIOGRAM COMPLETE  Result Date: 05/24/2022    ECHOCARDIOGRAM REPORT   Patient Name:   [name] Date of Exam: 05/23/2022 Medical Rec #:  [id number]      Height:        62.0 in Accession #:    [id number]     Weight:       148.0 lb Date of Birth:  1946-02-02       BSA:          1.682 m Patient Age:    76 years       BP:           139/63 mmHg Patient Gender: F              HR:           75 bpm. Exam Location:  [facility] Procedure: 2D Echo, Cardiac Doppler and Color Doppler Indications:     Atrial Fibrillation I48.91  History:         Patient has no prior history of Echocardiogram examinations.                  COPD; Risk Factors:Diabetes and Hypertension. DVT.  Sonographer:     Cristela Blue Referring Phys:  [id number] Brien Few NIU Diagnosing Phys: Marcina Millard MD IMPRESSIONS  1. Left ventricular ejection fraction, by estimation, is 65 to 70%. The left ventricle has normal function. The left ventricle has no regional wall motion abnormalities. Left ventricular diastolic parameters were normal.  2. Right ventricular systolic function is normal. The right ventricular size is normal.  3. The mitral valve is normal in structure. Mild mitral valve regurgitation. No evidence of mitral stenosis. Moderate mitral annular calcification.  4. The aortic valve is normal in structure. Aortic valve regurgitation is mild to moderate. Aortic valve sclerosis is present, with no evidence of aortic valve stenosis.  5. The inferior vena cava is normal in size with greater than 50% respiratory variability, suggesting right atrial pressure of 3 mmHg. FINDINGS  Left Ventricle: Left ventricular ejection fraction, by estimation, is 65 to 70%. The left ventricle has normal function. The left ventricle has no regional wall motion abnormalities. The left ventricular internal cavity size was normal in size. There is  no left ventricular hypertrophy. Left ventricular diastolic parameters were normal. Right Ventricle: The right ventricular size is normal. No increase in right ventricular wall thickness. Right ventricular systolic function is normal. Left Atrium: Left atrial size was normal in size. Right Atrium: Right  atrial size was normal in size. Pericardium: There is no evidence of pericardial effusion. Mitral Valve: The mitral valve is normal in structure. Moderate mitral annular calcification. Mild mitral valve regurgitation. No evidence of mitral valve stenosis. MV peak gradient, 18.0 mmHg. The mean mitral valve gradient is 8.0 mmHg. Tricuspid Valve: The tricuspid valve is normal in structure. Tricuspid valve regurgitation is mild . No evidence of tricuspid stenosis. Aortic Valve: The aortic valve is normal in structure. Aortic valve regurgitation is mild to moderate. Aortic valve sclerosis is present, with no evidence of aortic valve stenosis. Aortic valve mean gradient measures 4.0 mmHg. Aortic valve peak gradient measures 7.2 mmHg. Aortic valve area, by VTI measures 3.15 cm. Pulmonic Valve: The pulmonic valve was normal in structure. Pulmonic valve regurgitation is not visualized. No evidence of  pulmonic stenosis. Aorta: The aortic root is normal in size and structure. Venous: The inferior vena cava is normal in size with greater than 50% respiratory variability, suggesting right atrial pressure of 3 mmHg. IAS/Shunts: No atrial level shunt detected by color flow Doppler.  LEFT VENTRICLE PLAX 2D LVIDd:         4.10 cm   Diastology LVIDs:         2.60 cm   LV e' medial:    5.98 cm/s LV PW:         1.10 cm   LV E/e' medial:  31.1 LV IVS:        1.00 cm   LV e' lateral:   5.11 cm/s LVOT diam:     2.00 cm   LV E/e' lateral: 36.4 LV SV:         83 LV SV Index:   49 LVOT Area:     3.14 cm  RIGHT VENTRICLE RV Basal diam:  3.30 cm RV Mid diam:    2.40 cm RV S prime:     14.10 cm/s TAPSE (M-mode): 2.5 cm LEFT ATRIUM             Index        RIGHT ATRIUM           Index LA diam:        5.30 cm 3.15 cm/m   RA Area:     13.90 cm LA Vol (A2C):   39.4 ml 23.42 ml/m  RA Volume:   32.30 ml  19.20 ml/m LA Vol (A4C):   69.8 ml 41.50 ml/m LA Biplane Vol: 53.0 ml 31.51 ml/m  AORTIC VALVE AV Area (Vmax):    3.07 cm AV Area (Vmean):    3.31 cm AV Area (VTI):     3.15 cm AV Vmax:           134.00 cm/s AV Vmean:          89.300 cm/s AV VTI:            0.263 m AV Peak Grad:      7.2 mmHg AV Mean Grad:      4.0 mmHg LVOT Vmax:         131.00 cm/s LVOT Vmean:        94.200 cm/s LVOT VTI:          0.264 m LVOT/AV VTI ratio: 1.00  AORTA Ao Root diam: 2.80 cm MITRAL VALVE                TRICUSPID VALVE MV Area (PHT): 1.99 cm     TR Peak grad:   21.9 mmHg MV Area VTI:   1.30 cm     TR Vmax:        234.00 cm/s MV Peak grad:  18.0 mmHg MV Mean grad:  8.0 mmHg     SHUNTS MV Vmax:       2.12 m/s     Systemic VTI:  0.26 m MV Vmean:      137.0 cm/s   Systemic Diam: 2.00 cm MV Decel Time: 382 msec MV E velocity: 186.00 cm/s MV A velocity: 149.00 cm/s MV E/A ratio:  1.25 Marcina Millard MD Electronically signed by Marcina Millard MD Signature Date/Time: 05/24/2022/9:31:58 AM    Final    CT Angio Chest PE W/Cm &/Or Wo Cm  Result Date: 05/23/2022 CLINICAL DATA:  Chest pain, history of DVT, evaluate for PE EXAM: CT ANGIOGRAPHY CHEST WITH CONTRAST TECHNIQUE:  Multidetector CT imaging of the chest was performed using the standard protocol during bolus administration of intravenous contrast. Multiplanar CT image reconstructions and MIPs were obtained to evaluate the vascular anatomy. RADIATION DOSE REDUCTION: This exam was performed according to the departmental dose-optimization program which includes automated exposure control, adjustment of the mA and/or kV according to patient size and/or use of iterative reconstruction technique. CONTRAST:  75mL OMNIPAQUE IOHEXOL 350 MG/ML SOLN COMPARISON:  Chest radiograph dated 05/22/2022. CT chest dated 10/05/2021. FINDINGS: Cardiovascular: Satisfactory opacification of the bilateral pulmonary arteries to the segmental level. No evidence of pulmonary embolism. Although not tailored for evaluation of the thoracic aorta, there is no evidence of thoracic aortic aneurysm or dissection. Atherosclerotic calcifications  of the aortic arch. The heart is top-normal in size. No pericardial effusion. Mitral valve annular calcifications. Coronary atherosclerosis of the LAD and left circumflex. Mediastinum/Nodes: Small left supraclavicular nodes measuring up to 8 mm, within the upper limits of normal. Lungs/Pleura: Mild interstitial ground-glass opacities in the lungs bilaterally, suggesting mild interstitial edema. Moderate right pleural effusion. Associated right lower lobe atelectasis. No pneumothorax. Upper Abdomen: Visualized upper abdomen is notable for a mildly nodular hepatic contour, a small hiatal hernia, and vascular calcifications. Musculoskeletal: Mild degenerative changes of the visualized thoracolumbar spine. Review of the MIP images confirms the above findings. IMPRESSION: No evidence of pulmonary embolism. Mild interstitial edema with moderate right pleural effusion. Associated right lower lobe atelectasis. Aortic Atherosclerosis (ICD10-I70.0). Electronically Signed   By: Charline Bills M.D.   On: 05/23/2022 02:20   US Venous Img Lower Bilateral (DVT)  Result Date: 05/22/2022 CLINICAL DATA:  Positive D-dimer, right knee surgery on March 14, 2022 EXAM: BILATERAL LOWER EXTREMITY VENOUS DOPPLER ULTRASOUND TECHNIQUE: Gray-scale sonography with graded compression, as well as color Doppler and duplex ultrasound were performed to evaluate the lower extremity deep venous systems from the level of the common femoral vein and including the common femoral, femoral, profunda femoral, popliteal and calf veins including the posterior tibial, peroneal and gastrocnemius veins when visible. The superficial great saphenous vein was also interrogated. Spectral Doppler was utilized to evaluate flow at rest and with distal augmentation maneuvers in the common femoral, femoral and popliteal veins. COMPARISON:  Right lower extremity venous Doppler ultrasound September 22nd 2023 FINDINGS: RIGHT LOWER EXTREMITY Common Femoral Vein: No  evidence of thrombus. Normal compressibility, respiratory phasicity and response to augmentation. Saphenofemoral Junction: No evidence of thrombus. Normal compressibility and flow on color Doppler imaging. Profunda Femoral Vein: No evidence of thrombus. Normal compressibility and flow on color Doppler imaging. Femoral Vein: No evidence of thrombus. Normal compressibility, respiratory phasicity and response to augmentation. Popliteal Vein: No evidence of thrombus. Normal compressibility, respiratory phasicity and response to augmentation. Calf Veins: No evidence of thrombus. Normal compressibility and flow on color Doppler imaging. Superficial Great Saphenous Vein: No evidence of thrombus. Normal compressibility. Venous Reflux:  None. Other Findings:  None. LEFT LOWER EXTREMITY Common Femoral Vein: No evidence of thrombus. Normal compressibility, respiratory phasicity and response to augmentation. Saphenofemoral Junction: No evidence of thrombus. Normal compressibility and flow on color Doppler imaging. Profunda Femoral Vein: No evidence of thrombus. Normal compressibility and flow on color Doppler imaging. Femoral Vein: No evidence of thrombus. Normal compressibility, respiratory phasicity and response to augmentation. Popliteal Vein: No evidence of thrombus. Normal compressibility, respiratory phasicity and response to augmentation. Calf Veins: No evidence of thrombus. Normal compressibility and flow on color Doppler imaging. Superficial Great Saphenous Vein: No evidence of thrombus. Normal compressibility. Venous Reflux:  None.  Other Findings:  None. IMPRESSION: No evidence of deep venous thrombosis in either lower extremity. Electronically Signed   By: Jacob Moores M.D.   On: 05/22/2022 18:19   DG Chest Port 1 View  Result Date: 05/22/2022 CLINICAL DATA:  76 year old female with history of chest pain. EXAM: PORTABLE CHEST 1 VIEW COMPARISON:  Chest x-ray 12/31/2021. FINDINGS: Lung volumes are normal.  Diffuse interstitial prominence and widespread peribronchial cuffing. No consolidative airspace disease. No pleural effusions. No pneumothorax. Calcified granulomas in the right upper lobe again noted. No other suspicious appearing pulmonary nodule or mass noted. Pulmonary vasculature and the cardiomediastinal silhouette are within normal limits. Atherosclerosis in the thoracic aorta. Extensive calcifications of the mitral annulus. Status post left shoulder arthroplasty. Electronic device projecting over the left hemithorax, likely an implantable loop recorder. IMPRESSION: 1. Diffuse interstitial prominence and peribronchial cuffing, concerning for an acute bronchitis. 2. Aortic atherosclerosis. Electronically Signed   By: Trudie Reed M.D.   On: 05/22/2022 10:19    TELEMETRY reviewed by me (LT) 05/24/2022 : Sinus rhythm 60s to 80s  EKG reviewed by me: Atrial fibrillation with rate 129  Data reviewed by me (LT) 05/24/2022: EKGs, hospitalist progress note, CBC BMP troponins vitals telemetry  Principal Problem:   Atrial fibrillation with RVR (HCC) Active Problems:   Chest pain   HTN (hypertension)   Diabetes mellitus without complication (HCC)   COPD (chronic obstructive pulmonary disease) (HCC)   Hypothyroidism   Iron deficiency anemia   Acute sinusitis    ASSESSMENT AND PLAN:  [name] is a 76yoF with a PMH of HTN, DM2 COPD, asthma, GERD, hypothyroidism, remote history of DVT (1980) who presented to [facility] ED 05/22/2022 with chest pain.  She was found to be in atrial fibrillation with RVR with rates in the 130s to 150s on presentation, converted to sinus rhythm after a diltiazem infusion, amiodarone infusion, and oral metoprolol.  She had a Lexiscan Myoview performed the morning of 12/19 for further evaluation of her chest discomfort.  # Paroxysmal atrial fibrillation with RVR Remains in sinus rhythm, I converted amiodarone infusion to p.o. amiodarone 200 mg twice a day until 12/23,  then 200 mg daily thereafter. -Continue metoprolol 25 mg twice daily p.o. for rate control -Continue Eliquis 5 mg twice daily for stroke prevention. -Recommended the patient keep regular checks on her blood pressure and heart rate once daily. -I offered her follow-up in clinic with Dr. Juliann Pares after discharge, she says she has a cardiologist at [facility] that she would like to follow-up with.  We are available if needed in the future.  # Atypical chest pain Suspect secondary to A-fib with RVR, also reproducible to palpation of her right lower chest wall.  Troponins negative on admission.  Lexiscan Myoview revealed normal myocardial perfusion with hyperdynamic LVEF without evidence of ischemia.  Echo with LVEF 65-70%. -Continue secondary prevention with aspirin 81 mg daily -Start atorvastatin 40 mg daily at bedtime, LDL 123. -Consider addition of SGLT2 on an outpatient basis for better control of her diabetes, A1c 7.4%  She is okay for discharge from a cardiac standpoint today.  This patient's plan of care was discussed and created with Dr. Darrold Junker and he is in agreement.  Signed: Rebeca Allegra , PA-C 05/24/2022, 10:44 AM [facility] Cardiology

## 2022-05-24 NOTE — Discharge Summary (Signed)
Physician Discharge Summary   Patient: [name] MRN: [id number] DOB: 14-Nov-1945  Admit date:     05/22/2022  Discharge date: 05/25/22  Discharge Physician: Pennie Banter   PCP: Pcp, No   Recommendations at discharge:    Follow up with Cardiology as instructed Follow up with Primary Care / [facility] Repeat CBC, BMP, Mg in 1-2 weeks Target K>4, Mg>2 in setting of A-fib  Discharge Diagnoses: Principal Problem:   Atrial fibrillation with RVR (HCC) Active Problems:   Chest pain   HTN (hypertension)   Diabetes mellitus without complication (HCC)   COPD (chronic obstructive pulmonary disease) (HCC)   Hypothyroidism   Iron deficiency anemia   Acute sinusitis  Resolved Problems:   * No resolved hospital problems. *  Hospital Course: Ikram Riebe is a 76 y.o. female with medical history significant of hypertension, diabetes mellitus, COPD, asthma, GERD, hypothyroidism, depression, remote DVT 1980 (not on anticoagulants anymore), iron deficiency anemia, who presented to the ED on 05/22/2022 for evaluation of chest pain.   She was found to be in A-fib RVR with HR 130's to 150's. She was started on Cardizem drip in the ED.  Admitted to medicine service.  Cardiology consulted.  Cardiology recommended amiodarone drip. Cardizem drip was discontinued. Oral metoprolol tartrate was started for baseline rate control.  12/18 transitioned to oral amiodarone.  HR's controlled. Cardiology plans for nuclear stress test in AM.  Assessment and Plan: * Atrial fibrillation with RVR (HCC) Paroxysmal A-fib, new onset Mgmt per Cardiology, see their recommendations. Transitioned from drip to PO amiodarone. Continue PO Lopressor. Closely monitor BP. Telemetry shows HR's controlled. Monitor & replace electrolytes. Normal Myoview stress test this AM. Cariology follow up as instructed.  Chest pain Cardiology plans for nuclear stress test in AM tomorrow.  HTN (hypertension) Home Lasix  continued. Enalapril held on admission to allow BP room for HR control. PRN IV hydralazine.  Diabetes mellitus without complication (HCC) Uncontrolled, A1c in Sept this year 7.8% Now hyperglycemic around 300 after steroids to pretreat for IV contrast on admission yesterday Sliding scale Novolog. Patient has profound hyperglycemia today after given steroids to prevent allergic reaction to contrast. Patient adamantly refusing to be given any insulin despite counseling provided on importance of glycemic control.   COPD (chronic obstructive pulmonary disease) (HCC) Not exacerbated. Bronchodilators.  Hypothyroidism Synthroid  Iron deficiency anemia Hbg stable. Monitor CBC. Continue iron supplement.   Acute sinusitis Resume outpatient amoxicillin to complete course. Supportive care with allergy medications and nasal irrigation.         Consultants: Cardiology Procedures performed: Echo. Myoview  Disposition: Home Diet recommendation:  Cardiac diet DISCHARGE MEDICATION: Allergies as of 05/24/2022       Reactions   Cephalosporins Itching   Codeine Other (See Comments)   Extreme drowsiness   Contrast Media [iodinated Contrast Media] Hives, Swelling   Facial swelling   Oxycodone Other (See Comments)   Extreme drowsiness        Medication List     STOP taking these medications    benzonatate 100 MG capsule Commonly known as: Tessalon Perles   cyclobenzaprine 5 MG tablet Commonly known as: FLEXERIL   docusate sodium 100 MG capsule Commonly known as: COLACE   enoxaparin 40 MG/0.4ML injection Commonly known as: LOVENOX   hydrocortisone cream 1 %   omeprazole 20 MG capsule Commonly known as: PRILOSEC   traMADol 50 MG tablet Commonly known as: ULTRAM       TAKE these medications    acetaminophen  325 MG tablet Commonly known as: TYLENOL Take 1,300 mg by mouth every 6 (six) hours as needed for moderate pain.   albuterol (2.5 MG/3ML) 0.083%  nebulizer solution Commonly known as: PROVENTIL Take 2.5 mg by nebulization every 4 (four) hours as needed for wheezing or shortness of breath.   albuterol 108 (90 Base) MCG/ACT inhaler Commonly known as: VENTOLIN HFA Inhale 2-4 puffs by mouth every 4 hours as needed for wheezing, cough, and/or shortness of breath   amiodarone 200 MG tablet Commonly known as: PACERONE Take 1 tablet (200 mg total) by mouth 2 (two) times daily for 4 days, THEN 1 tablet (200 mg total) daily. Start taking on: May 24, 2022   amoxicillin 500 MG capsule Commonly known as: AMOXIL Take 500 mg by mouth 3 (three) times daily.   apixaban 5 MG Tabs tablet Commonly known as: ELIQUIS Take 1 tablet (5 mg total) by mouth 2 (two) times daily.   atorvastatin 20 MG tablet Commonly known as: LIPITOR Take 20 mg by mouth daily.   busPIRone 5 MG tablet Commonly known as: BUSPAR Take 5 mg by mouth 3 (three) times daily as needed.   enalapril 20 MG tablet Commonly known as: VASOTEC Take 20 mg by mouth daily.   esomeprazole 40 MG capsule Commonly known as: NEXIUM Take 40 mg by mouth 2 (two) times daily.   eucerin lotion Apply 1 Application topically as needed for dry skin.   ferrous sulfate 325 (65 FE) MG tablet Take 325 mg by mouth daily.   fluticasone 50 MCG/ACT nasal spray Commonly known as: FLONASE Place 2 sprays into both nostrils daily as needed for allergies or rhinitis.   furosemide 20 MG tablet Commonly known as: LASIX Take 20 mg by mouth daily.   ibuprofen 200 MG tablet Commonly known as: ADVIL Take 400 mg by mouth every 6 (six) hours as needed for mild pain.   ketoconazole 2 % shampoo Commonly known as: NIZORAL Apply 1 Application topically 2 (two) times a week.   ketotifen 0.025 % ophthalmic solution Commonly known as: ZADITOR Place 1 drop into both eyes daily as needed.   levothyroxine 125 MCG tablet Commonly known as: SYNTHROID Take 125 mcg by mouth daily before breakfast.    loratadine 10 MG tablet Commonly known as: CLARITIN Take 1 tablet (10 mg total) by mouth daily.   Magnesium 250 MG Tabs Take 1 tablet by mouth daily.   metoprolol tartrate 25 MG tablet Commonly known as: LOPRESSOR Take 1 tablet (25 mg total) by mouth 2 (two) times daily.   Mucinex 600 MG 12 hr tablet Generic drug: guaiFENesin Take 1 tablet (600 mg total) by mouth 2 (two) times daily as needed. What changed:  when to take this reasons to take this   Omega-3 1000 MG Caps Take 1 capsule by mouth daily.   OXYGEN Inhale 2 L into the lungs at bedtime.   potassium chloride 10 MEQ tablet Commonly known as: KLOR-CON M Take 10 mEq by mouth daily.   Refresh Optive Advanced 0.5-1-0.5 % Soln Generic drug: Carboxymeth-Glycerin-Polysorb Apply 2 drops to eye daily.   sodium chloride 0.65 % Soln nasal spray Commonly known as: OCEAN Place 1 spray into both nostrils as needed for congestion.   TURMERIC PO Take 1 capsule by mouth daily.   VITAMIN C CR 1500 MG Tbcr Take 1 tablet by mouth daily.   VITAMIN D-VITAMIN K PO Take 4,000 Units by mouth daily.         Discharge Exam:  Filed Weights   05/22/22 1005 05/24/22 0459  Weight: 67.1 kg 69.5 kg   General exam: awake, alert, no acute distress Respiratory system: CTAB, no wheezes, rales or rhonchi, normal respiratory effort. Cardiovascular system: normal S1/S2, RRR, no JVD, murmurs, rubs, gallops, no pedal edema.   Gastrointestinal system: soft, NT, ND, no HSM felt, +bowel sounds. Central nervous system: A&O x4. no gross focal neurologic deficits, normal speech Extremities: moves all , no edema, normal tone Skin: dry, intact, normal temperature, normal color, No rashes, lesions or ulcers Psychiatry: normal mood, congruent affect, judgement and insight appear normal   Condition at discharge: stable  The results of significant diagnostics from this hospitalization (including imaging, microbiology, ancillary and laboratory)  are listed below for reference.   Imaging Studies: NM Myocar Multi W/Spect W/Wall Motion / EF  Result Date: 05/24/2022   The study is normal. The study is low risk.   No ST deviation was noted.   LV perfusion is normal. There is no evidence of ischemia. There is no evidence of infarction.   Left ventricular function is normal. Nuclear stress EF: 82 %. The left ventricular ejection fraction is hyperdynamic (>65%). 1.  Normal left ventricular function 2.  No evidence for scar or ischemia 3.  Low risk study   ECHOCARDIOGRAM COMPLETE  Result Date: 05/24/2022    ECHOCARDIOGRAM REPORT   Patient Name:   NIMA BAMBURG Date of Exam: 05/23/2022 Medical Rec #:  [id number]      Height:       62.0 in Accession #:    [id number]     Weight:       148.0 lb Date of Birth:  [birth year]       BSA:          1.682 m Patient Age:    76 years       BP:           139/63 mmHg Patient Gender: F              HR:           75 bpm. Exam Location:  [facility] Procedure: 2D Echo, Cardiac Doppler and Color Doppler Indications:     Atrial Fibrillation I48.91  History:         Patient has no prior history of Echocardiogram examinations.                  COPD; Risk Factors:Diabetes and Hypertension. DVT.  Sonographer:     Cristela Blue Referring Phys:  [id number] Brien Few NIU Diagnosing Phys: Marcina Millard MD IMPRESSIONS  1. Left ventricular ejection fraction, by estimation, is 65 to 70%. The left ventricle has normal function. The left ventricle has no regional wall motion abnormalities. Left ventricular diastolic parameters were normal.  2. Right ventricular systolic function is normal. The right ventricular size is normal.  3. The mitral valve is normal in structure. Mild mitral valve regurgitation. No evidence of mitral stenosis. Moderate mitral annular calcification.  4. The aortic valve is normal in structure. Aortic valve regurgitation is mild to moderate. Aortic valve sclerosis is present, with no evidence of aortic valve stenosis.  5. The  inferior vena cava is normal in size with greater than 50% respiratory variability, suggesting right atrial pressure of 3 mmHg. FINDINGS  Left Ventricle: Left ventricular ejection fraction, by estimation, is 65 to 70%. The left ventricle has normal function. The left ventricle has no regional wall motion abnormalities. The left ventricular internal cavity size was normal in  size. There is  no left ventricular hypertrophy. Left ventricular diastolic parameters were normal. Right Ventricle: The right ventricular size is normal. No increase in right ventricular wall thickness. Right ventricular systolic function is normal. Left Atrium: Left atrial size was normal in size. Right Atrium: Right atrial size was normal in size. Pericardium: There is no evidence of pericardial effusion. Mitral Valve: The mitral valve is normal in structure. Moderate mitral annular calcification. Mild mitral valve regurgitation. No evidence of mitral valve stenosis. MV peak gradient, 18.0 mmHg. The mean mitral valve gradient is 8.0 mmHg. Tricuspid Valve: The tricuspid valve is normal in structure. Tricuspid valve regurgitation is mild . No evidence of tricuspid stenosis. Aortic Valve: The aortic valve is normal in structure. Aortic valve regurgitation is mild to moderate. Aortic valve sclerosis is present, with no evidence of aortic valve stenosis. Aortic valve mean gradient measures 4.0 mmHg. Aortic valve peak gradient measures 7.2 mmHg. Aortic valve area, by VTI measures 3.15 cm. Pulmonic Valve: The pulmonic valve was normal in structure. Pulmonic valve regurgitation is not visualized. No evidence of pulmonic stenosis. Aorta: The aortic root is normal in size and structure. Venous: The inferior vena cava is normal in size with greater than 50% respiratory variability, suggesting right atrial pressure of 3 mmHg. IAS/Shunts: No atrial level shunt detected by color flow Doppler.  LEFT VENTRICLE PLAX 2D LVIDd:         4.10 cm   Diastology  LVIDs:         2.60 cm   LV e' medial:    5.98 cm/s LV PW:         1.10 cm   LV E/e' medial:  31.1 LV IVS:        1.00 cm   LV e' lateral:   5.11 cm/s LVOT diam:     2.00 cm   LV E/e' lateral: 36.4 LV SV:         83 LV SV Index:   49 LVOT Area:     3.14 cm  RIGHT VENTRICLE RV Basal diam:  3.30 cm RV Mid diam:    2.40 cm RV S prime:     14.10 cm/s TAPSE (M-mode): 2.5 cm LEFT ATRIUM             Index        RIGHT ATRIUM           Index LA diam:        5.30 cm 3.15 cm/m   RA Area:     13.90 cm LA Vol (A2C):   39.4 ml 23.42 ml/m  RA Volume:   32.30 ml  19.20 ml/m LA Vol (A4C):   69.8 ml 41.50 ml/m LA Biplane Vol: 53.0 ml 31.51 ml/m  AORTIC VALVE AV Area (Vmax):    3.07 cm AV Area (Vmean):   3.31 cm AV Area (VTI):     3.15 cm AV Vmax:           134.00 cm/s AV Vmean:          89.300 cm/s AV VTI:            0.263 m AV Peak Grad:      7.2 mmHg AV Mean Grad:      4.0 mmHg LVOT Vmax:         131.00 cm/s LVOT Vmean:        94.200 cm/s LVOT VTI:          0.264 m LVOT/AV VTI ratio: 1.00  AORTA Ao Root diam: 2.80 cm MITRAL VALVE                TRICUSPID VALVE MV Area (PHT): 1.99 cm     TR Peak grad:   21.9 mmHg MV Area VTI:   1.30 cm     TR Vmax:        234.00 cm/s MV Peak grad:  18.0 mmHg MV Mean grad:  8.0 mmHg     SHUNTS MV Vmax:       2.12 m/s     Systemic VTI:  0.26 m MV Vmean:      137.0 cm/s   Systemic Diam: 2.00 cm MV Decel Time: 382 msec MV E velocity: 186.00 cm/s MV A velocity: 149.00 cm/s MV E/A ratio:  1.25 Marcina Millard MD Electronically signed by Marcina Millard MD Signature Date/Time: 05/24/2022/9:31:58 AM    Final    CT Angio Chest PE W/Cm &/Or Wo Cm  Result Date: 05/23/2022 CLINICAL DATA:  Chest pain, history of DVT, evaluate for PE EXAM: CT ANGIOGRAPHY CHEST WITH CONTRAST TECHNIQUE: Multidetector CT imaging of the chest was performed using the standard protocol during bolus administration of intravenous contrast. Multiplanar CT image reconstructions and MIPs were obtained to evaluate  the vascular anatomy. RADIATION DOSE REDUCTION: This exam was performed according to the departmental dose-optimization program which includes automated exposure control, adjustment of the mA and/or kV according to patient size and/or use of iterative reconstruction technique. CONTRAST:  75mL OMNIPAQUE IOHEXOL 350 MG/ML SOLN COMPARISON:  Chest radiograph dated 05/22/2022. CT chest dated 10/05/2021. FINDINGS: Cardiovascular: Satisfactory opacification of the bilateral pulmonary arteries to the segmental level. No evidence of pulmonary embolism. Although not tailored for evaluation of the thoracic aorta, there is no evidence of thoracic aortic aneurysm or dissection. Atherosclerotic calcifications of the aortic arch. The heart is top-normal in size. No pericardial effusion. Mitral valve annular calcifications. Coronary atherosclerosis of the LAD and left circumflex. Mediastinum/Nodes: Small left supraclavicular nodes measuring up to 8 mm, within the upper limits of normal. Lungs/Pleura: Mild interstitial ground-glass opacities in the lungs bilaterally, suggesting mild interstitial edema. Moderate right pleural effusion. Associated right lower lobe atelectasis. No pneumothorax. Upper Abdomen: Visualized upper abdomen is notable for a mildly nodular hepatic contour, a small hiatal hernia, and vascular calcifications. Musculoskeletal: Mild degenerative changes of the visualized thoracolumbar spine. Review of the MIP images confirms the above findings. IMPRESSION: No evidence of pulmonary embolism. Mild interstitial edema with moderate right pleural effusion. Associated right lower lobe atelectasis. Aortic Atherosclerosis (ICD10-I70.0). Electronically Signed   By: Charline Bills M.D.   On: 05/23/2022 02:20   US Venous Img Lower Bilateral (DVT)  Result Date: 05/22/2022 CLINICAL DATA:  Positive D-dimer, right knee surgery on March 14, 2022 EXAM: BILATERAL LOWER EXTREMITY VENOUS DOPPLER ULTRASOUND TECHNIQUE:  Gray-scale sonography with graded compression, as well as color Doppler and duplex ultrasound were performed to evaluate the lower extremity deep venous systems from the level of the common femoral vein and including the common femoral, femoral, profunda femoral, popliteal and calf veins including the posterior tibial, peroneal and gastrocnemius veins when visible. The superficial great saphenous vein was also interrogated. Spectral Doppler was utilized to evaluate flow at rest and with distal augmentation maneuvers in the common femoral, femoral and popliteal veins. COMPARISON:  Right lower extremity venous Doppler ultrasound September 22nd 2023 FINDINGS: RIGHT LOWER EXTREMITY Common Femoral Vein: No evidence of thrombus. Normal compressibility, respiratory phasicity and response to augmentation. Saphenofemoral Junction: No evidence of thrombus. Normal compressibility  and flow on color Doppler imaging. Profunda Femoral Vein: No evidence of thrombus. Normal compressibility and flow on color Doppler imaging. Femoral Vein: No evidence of thrombus. Normal compressibility, respiratory phasicity and response to augmentation. Popliteal Vein: No evidence of thrombus. Normal compressibility, respiratory phasicity and response to augmentation. Calf Veins: No evidence of thrombus. Normal compressibility and flow on color Doppler imaging. Superficial Great Saphenous Vein: No evidence of thrombus. Normal compressibility. Venous Reflux:  None. Other Findings:  None. LEFT LOWER EXTREMITY Common Femoral Vein: No evidence of thrombus. Normal compressibility, respiratory phasicity and response to augmentation. Saphenofemoral Junction: No evidence of thrombus. Normal compressibility and flow on color Doppler imaging. Profunda Femoral Vein: No evidence of thrombus. Normal compressibility and flow on color Doppler imaging. Femoral Vein: No evidence of thrombus. Normal compressibility, respiratory phasicity and response to augmentation.  Popliteal Vein: No evidence of thrombus. Normal compressibility, respiratory phasicity and response to augmentation. Calf Veins: No evidence of thrombus. Normal compressibility and flow on color Doppler imaging. Superficial Great Saphenous Vein: No evidence of thrombus. Normal compressibility. Venous Reflux:  None. Other Findings:  None. IMPRESSION: No evidence of deep venous thrombosis in either lower extremity. Electronically Signed   By: Jacob Moores M.D.   On: 05/22/2022 18:19   DG Chest Port 1 View  Result Date: 05/22/2022 CLINICAL DATA:  76 year old female with history of chest pain. EXAM: PORTABLE CHEST 1 VIEW COMPARISON:  Chest x-ray 12/31/2021. FINDINGS: Lung volumes are normal. Diffuse interstitial prominence and widespread peribronchial cuffing. No consolidative airspace disease. No pleural effusions. No pneumothorax. Calcified granulomas in the right upper lobe again noted. No other suspicious appearing pulmonary nodule or mass noted. Pulmonary vasculature and the cardiomediastinal silhouette are within normal limits. Atherosclerosis in the thoracic aorta. Extensive calcifications of the mitral annulus. Status post left shoulder arthroplasty. Electronic device projecting over the left hemithorax, likely an implantable loop recorder. IMPRESSION: 1. Diffuse interstitial prominence and peribronchial cuffing, concerning for an acute bronchitis. 2. Aortic atherosclerosis. Electronically Signed   By: Trudie Reed M.D.   On: 05/22/2022 10:19    Microbiology: Results for orders placed or performed during the hospital encounter of 03/02/22  Surgical pcr screen     Status: None   Collection Time: 03/02/22 11:17 AM   Specimen: Nasal Mucosa; Nasal Swab  Result Value Ref Range Status   MRSA, PCR NEGATIVE NEGATIVE Final   Staphylococcus aureus NEGATIVE NEGATIVE Final    Comment: (NOTE) The Xpert SA Assay (FDA approved for NASAL specimens in patients 84 years of age and older), is one component  of a comprehensive surveillance program. It is not intended to diagnose infection nor to guide or monitor treatment. Performed at [facility], [address] [place], [place] [address]     Labs: CBC: Recent Labs  Lab 05/22/22 1010 05/23/22 0554  WBC 9.1 6.3  HGB 13.6 11.1*  HCT 42.5 34.4*  MCV 94.2 93.5  PLT 163 152   Basic Metabolic Panel: Recent Labs  Lab 05/22/22 1010 05/23/22 0554 05/24/22 0609  NA 135 130* 139  K 4.4 4.5 3.9  CL 103 100 107  CO2 20* 21* 25  GLUCOSE 201* 296* 139*  BUN 15 27* 36*  CREATININE 0.59 0.95 0.80  CALCIUM 9.2 8.5* 8.8*  MG  --   --  2.1   Liver Function Tests: Recent Labs  Lab 05/22/22 1010  AST 47*  ALT 25  ALKPHOS 44  BILITOT 1.1  PROT 8.1  ALBUMIN 4.0   CBG: Recent Labs  Lab 05/23/22 1635 05/23/22 2012 05/24/22 0817 05/24/22 1140 05/24/22 1609  GLUCAP 147* 139* 126* 155* 153*    Discharge time spent: greater than 30 minutes.  Signed: Pennie Banter, DO [facility] 05/25/2022

## 2022-05-25 ENCOUNTER — Ambulatory Visit: Admit: 2022-05-25 | Discharge: 2022-05-25 | Payer: MEDICARE

## 2022-05-25 DIAGNOSIS — Z471 Aftercare following joint replacement surgery: Secondary | ICD-10-CM

## 2022-05-25 DIAGNOSIS — M25511 Pain in right shoulder: Secondary | ICD-10-CM

## 2022-05-25 MED ORDER — triamcinolone acetonide (KENALOG-40) injection 40 mg
40 | Freq: Once | INTRAMUSCULAR | Status: AC
Start: 2022-05-25 — End: 2022-05-25
  Administered 2022-05-25: 18:00:00 40 mg via INTRA_ARTICULAR

## 2022-05-25 MED ORDER — lidocaine 10 mg/mL (1 %) injection 2 mL
10 | Freq: Once | INTRAMUSCULAR | Status: AC
Start: 2022-05-25 — End: 2022-05-25
  Administered 2022-05-25: 18:00:00 2 mL via SUBCUTANEOUS

## 2022-05-25 MED ORDER — BUPivacaine HCl (MARCAINE) 0.5 % (5 mg/mL) injection 10 mg
0.5 | Freq: Once | INTRAMUSCULAR | Status: AC
Start: 2022-05-25 — End: 2022-05-25
  Administered 2022-05-25: 18:00:00 10 mL via SUBCUTANEOUS

## 2022-05-25 NOTE — Unmapped (Signed)
Orthopaedic Surgery Office Note    Subjective:  [name] is a 76 y.o. female who is presenting today for follow-up of her right shoulder arthroscopic balloon arthroplasty on 02/17/2022.  She continues to be disappointed with her progress and continued pain.  She has been active this fall she reports working on the yard and continuing to practice her instrument.  She is still not interested in any type of replacement given her recovery of her left knee replacement was challenging.    ROS: All others negative except for as stated in HPI    The following portions of the patient's history were reviewed and updated as appropriate: allergies, current medications, past medical history, past surgical history, and past social history.    Objective:  Vitals:    05/25/22 0957   Weight: (!) 97 lb (44 kg)   Height: 5' 4 (1.626 m)       Physical Exam:  General: NAD  Cardiopulmonary: unlabored respirations  MSK:      Right upper Extremity   - SILT m/u/r/a  - AIN/PIN/IO intact  - Cap refill < 2 sec  -Shoulder examination: Patient able to actively forward flex her shoulder to approximately 140 degrees, she demonstrates active external rotation and internal rotation.  Positive Hawkins      Imaging:  No new imaging taken at this visit    Assessment:  [name] is a 76 y.o. female approximately 3 months from a subacromial balloon     Plan:  - we gave her an injection today  She will follow up after the holidays if she wants to pursue a MRI    Procedure Performed:  Glenohumeral Joint Steroid Injection - Right    Anesthesia:  none    Specimen Removed:  none    Complications:  none    PROCEDURE IN DETAIL:    After the patient's chart was reviewed, informed consent was obtained to proceed with the stated procedure.    The appropriate extremity and site of procedure was identified and verified with the patient.    The site was then prepped and draped in usual sterile fashion utilizing aseptic solution.  The patient was seated  upright and the superolateral border of the scapula was palpated.  Ethyl Chloride was used to anesthetize the skin.  The needle was inserted just inferior to the posterolateral edge of the acromion and advanced in an anteromedial angle toward the coracoid process.  Once the posterior capsule was violated, 2 ml 1% lidocaine, 2 ml 0.5% marcaine and 1 ml of triamcinolone (KENALOG) 40mg /ml was injected without resistance using a 22 gauge 1 1/2 inch needle.  The needle was withdrawn from the site and hemostasis was obtained by direct pressure.  A band aide was applied to the site.  The patient tolerated the procedure well without complications.

## 2022-05-25 NOTE — Unmapped (Signed)
This injection is documented for [facility], CNP.    Per provider's instructions, the patient was prepped for an injection by [facility], CNP  - as well as drug and  allergies were reviewed.   The appropriate medication was drawn, and documented.  A medical assistant was also present to assist with the injection. The injection was administered by [facility], CNP. The patient was then educated on what to expect following the injection.   All questions were answered.    A verbal consent was obtained and a verbal timeout was performed on 05/25/2022 at 10:10 AM.     Lakeyia Surber, MA

## 2022-06-29 ENCOUNTER — Ambulatory Visit: Admit: 2022-06-29 | Discharge: 2022-06-29 | Payer: MEDICARE | Attending: Sports Medicine

## 2022-06-29 DIAGNOSIS — M25511 Pain in right shoulder: Secondary | ICD-10-CM

## 2022-06-29 NOTE — Unmapped (Signed)
note

## 2022-07-04 IMAGING — CT CT CHEST W/O CM
2 of 4 series · 15 of 36 positions shown, 18 images · non-contrast
Comparison: 11/27/2020

CLINICAL DATA: Difficulty swallowing, COPD



[Series 2: thorax · axial · 0.65mm/px · z∈[-161,+103]mm · 12 of 158 slices shown, 15 images]
[im 13/158  mediastinal]
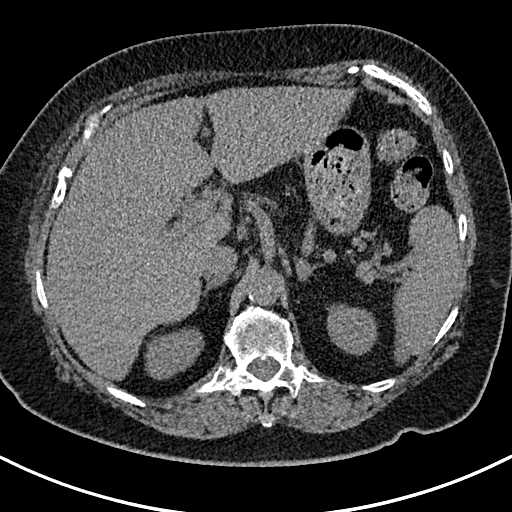
[im 13/158  lung]
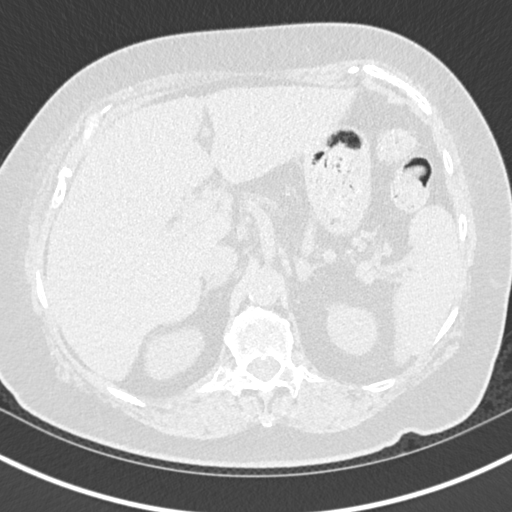
[im 25/158  lung]
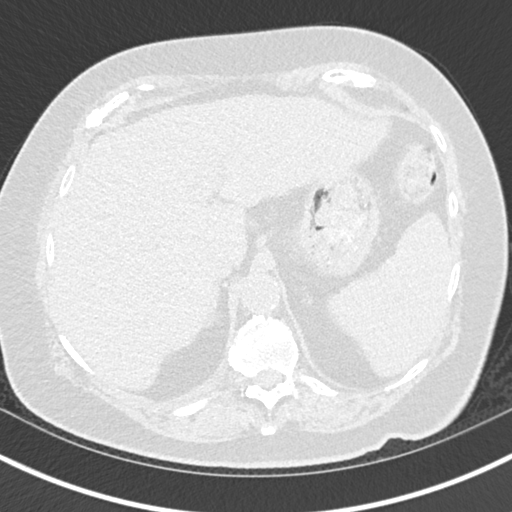
[im 37/158  lung]
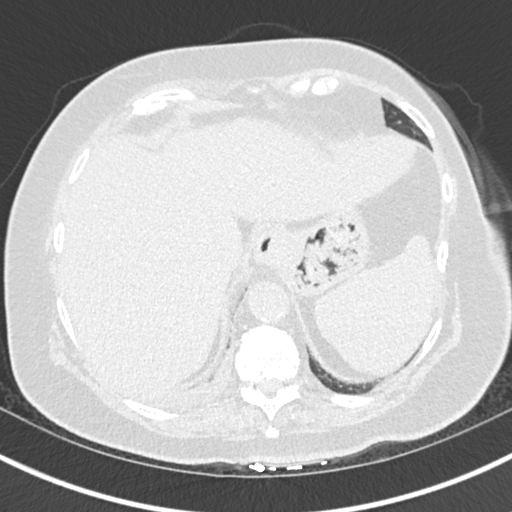
[im 49/158  lung]
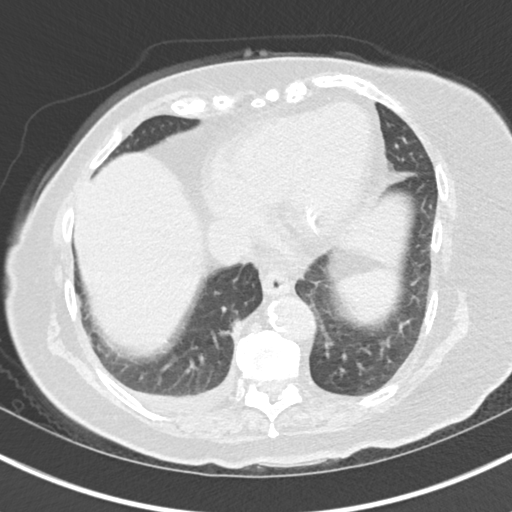
[im 61/158  mediastinal]
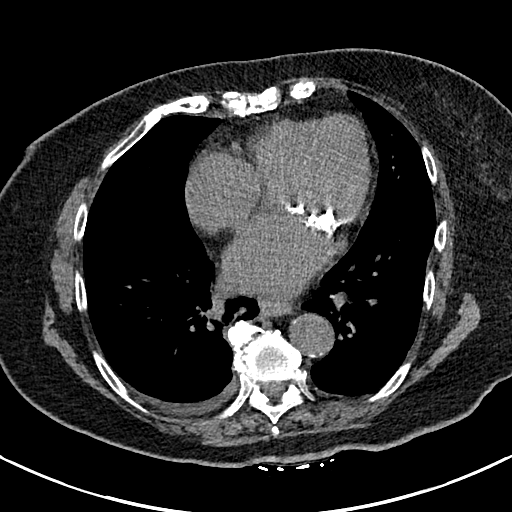
[im 61/158  lung]
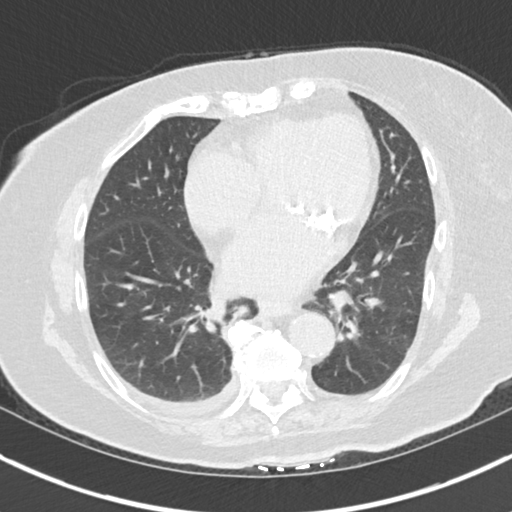
[im 73/158  lung]
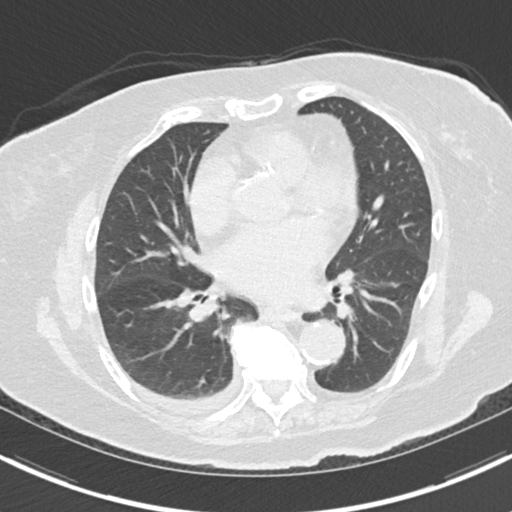
[im 85/158  lung]
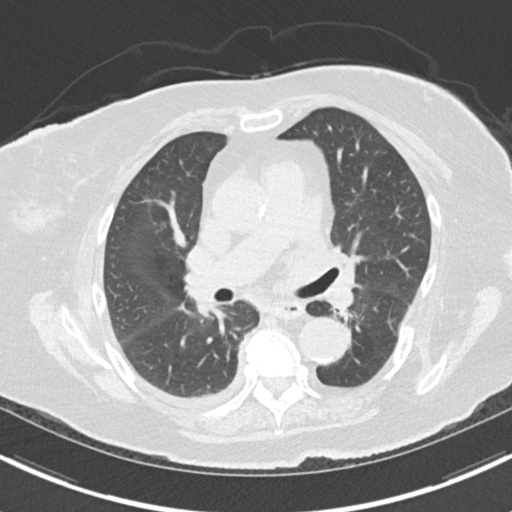
[im 97/158  lung]
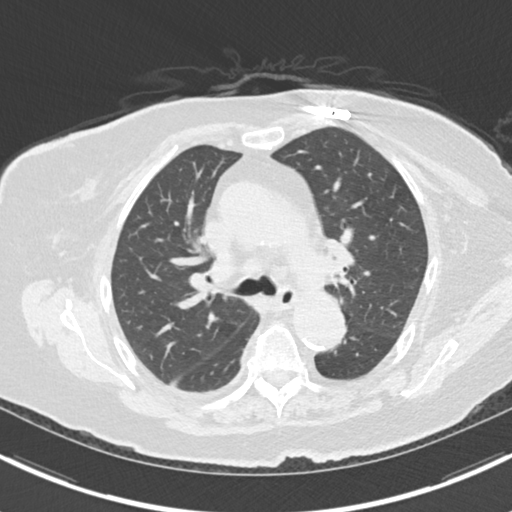
[im 109/158  mediastinal]
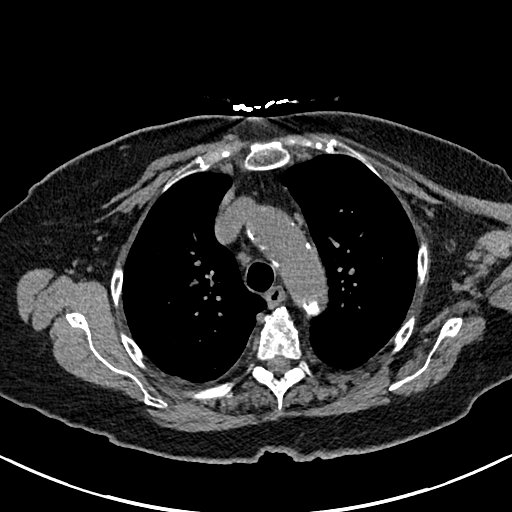
[im 109/158  lung]
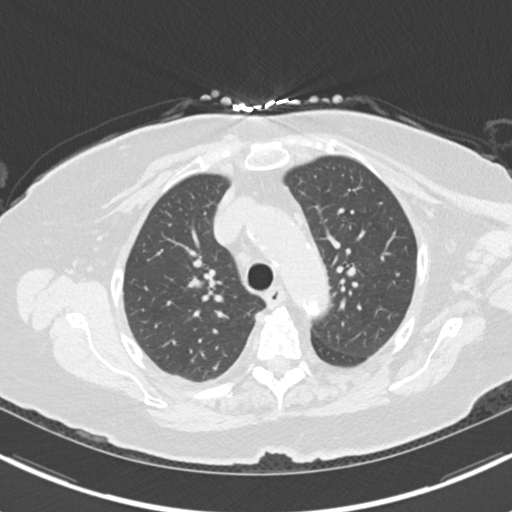
[im 121/158  lung]
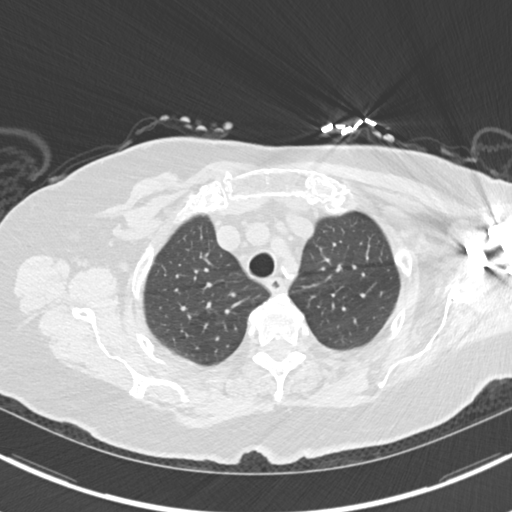
[im 133/158  lung]
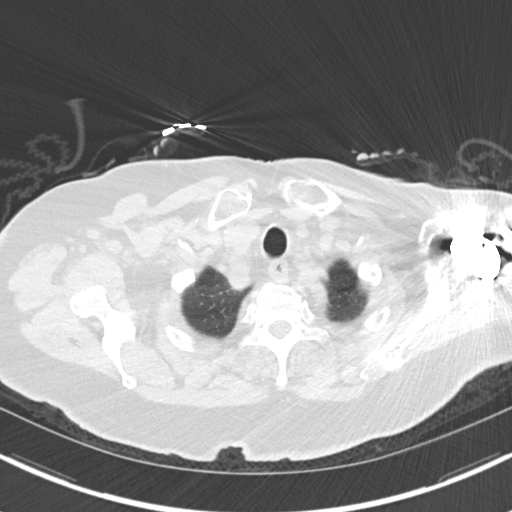
[im 145/158  lung]
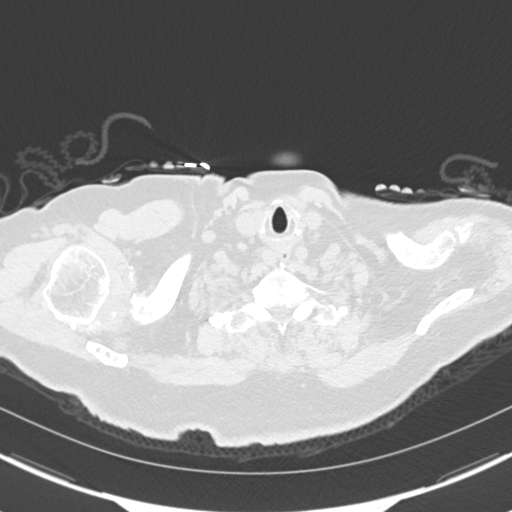

[Series 5: coronal · coronal · 0.64mm/px · 3 of 137 slices shown]
[im 28/137  lung]
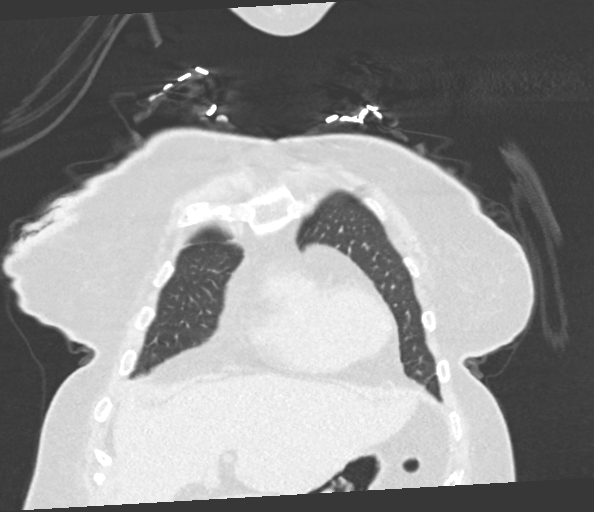
[im 55/137  lung]
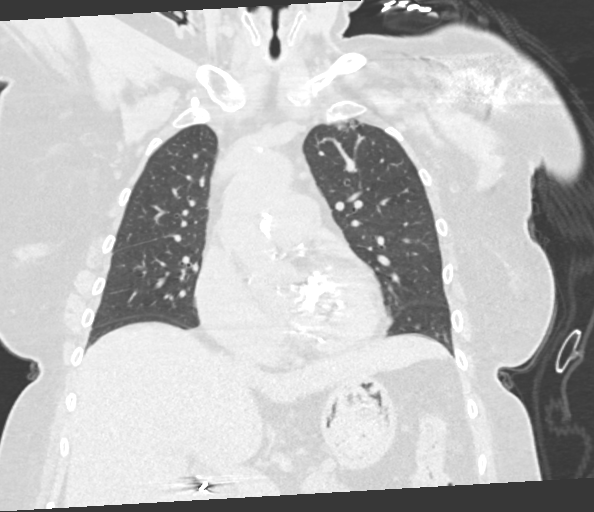
[im 82/137  lung]
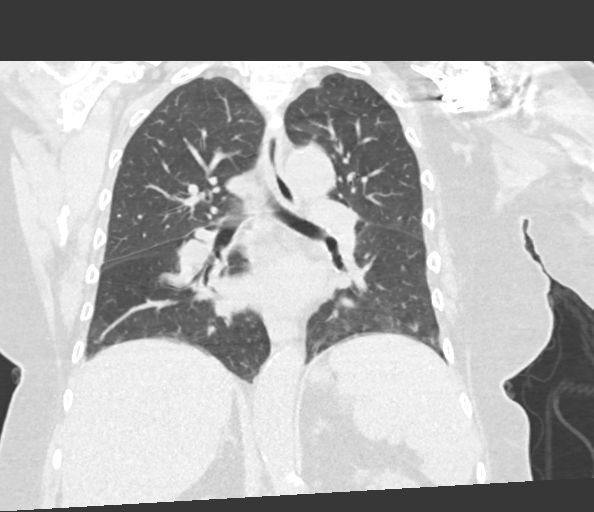

[15 of 36 positions shown; findings below may reference images not displayed]

FINDINGS: Cardiovascular: Unenhanced imaging of the heart and great vessels
demonstrates no pericardial effusion. There is dense calcification
of the mitral annulus, with mild calcification of the aortic valve.
Prominent atherosclerosis within the LAD distribution of the
coronary vasculature.

Normal caliber of the thoracic aorta. Diffuse aortic
atherosclerosis. Evaluation of the vascular lumen limited without IV
contrast.

Mediastinum/Nodes: No enlarged mediastinal or axillary lymph nodes.
Thyroid gland, trachea, and esophagus demonstrate no significant
findings.

Lungs/Pleura: No acute airspace disease. Trace right pleural
effusion. No pneumothorax. Central airways are patent.

Upper Abdomen: No acute abnormality.  Small hiatal hernia.

Musculoskeletal: No acute or destructive bony lesions. Left shoulder
arthroplasty. Reconstructed images demonstrate no additional
findings.
IMPRESSION: 1. Trace right pleural effusion.
2. No acute airspace disease.
3. Small hiatal hernia.
4. Aortic Atherosclerosis ([id number]). Coronary artery
atherosclerosis.

## 2022-08-02 NOTE — Telephone Encounter (Signed)
Called pt, no answer left vm with referral and number to call to schedule.

## 2022-08-02 NOTE — Telephone Encounter (Signed)
Referral placed for GI. Can also refer to [name] if there is a long wait time for UC.

## 2022-08-02 NOTE — Telephone Encounter (Signed)
Pt is req referral to GI for unresolved dx for bouts of diarrhea and constipation along w/ bloating.

## 2022-08-11 ENCOUNTER — Ambulatory Visit: Admit: 2022-08-11 | Discharge: 2022-08-11 | Payer: MEDICARE

## 2022-08-11 DIAGNOSIS — Z01818 Encounter for other preprocedural examination: Secondary | ICD-10-CM

## 2022-08-11 NOTE — Progress Notes (Signed)
[facility]  [facility] [facility]  [place] [id number]  Subjective   Name:  [name] Date of Birth: 08-02-1945 (77 y.o.)   MRN: [id number]    Date of Service:  08/11/2022      Chief Complaint   Patient presents with    Pre-op Exam     3/21, Dr Durward Fortes, [place], R shoulder surgery fax: [phone]     History of Present Illness:  [name] is a(n) 77 y.o. female here today for the following:   HPI    77yo female with history of mild hyponatremia / osteoporosis here for pre-op    Operative H&P    Type of surgery: R reverse total shoulder arthroplasty, biceps tenodesis  Type of anesthesia: general  Name of surgeon: Lanier Ensign, MD [facility])  Date of surgery: 08/25/2022    Surgery Risk: Low Intermediate High  Past Medical History:   Diagnosis Date    Acquired absence of ovary, unilateral     Missing left ovary.    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    History of fall     fell 10/2015    Low back pain     Osteoarthritis     Osteoporosis     Urticaria     Varicella      Past Surgical History:   Procedure Laterality Date    ARTHROSCOPY SHOULDER Right 02/17/2022    Procedure: Right Shoulder Arthroscopic Balloon Arthroplasty;  Surgeon: Philis Fendt, MD;  Location: [facility] OR [facility];  Service: Orthopedics;  Laterality: Right;    CESAREAN SECTION  1985    HARDWARE REMOVAL  8/09    of left tibia plateau    LEG SURGERY  05/09/06    fractured left tibia plateau    RELEASE CARPAL TUNNEL Left 03/22/2016    Procedure: LEFT CARPAL TUNNEL RELEASE;  Surgeon: Alisia Ferrari, MD;  Location: [facility] OR;  Service: Orthopedics;  Laterality: Left;    TOTAL KNEE ARTHROPLASTY Left 11/09/2021    Procedure: LEFT TOTAL KNEE ARTHROPLASTY;  Surgeon: Asher Muir, MD;  Location: [facility] OR;  Service: Orthopedics;  Laterality:  Left;    WRIST FRACTURE SURGERY Left 11/13/2015    Procedure: OPEN REDUCTION INTERNAL FIXATION LEFT DISTAL RADIUS FRACTURE;  Surgeon: Alisia Ferrari, MD;  Location: [facility] OR;  Service: Orthopedics;  Laterality: Left;     Social History     Socioeconomic History    Marital status: Widowed     Spouse name: Not on file    Number of children: Not on file    Years of education: Not on file    Highest education level: Not on file   Occupational History    Not on file   Tobacco Use    Smoking status: Never    Smokeless tobacco: Never   Vaping Use    Vaping Use: Never used   Substance and Sexual Activity    Alcohol use: Yes     Alcohol/week: 4.0 standard drinks of alcohol     Types: 4 Glasses of wine per week    Drug use: No     Comment: 12-23-2009    Sexual activity: Not Currently     Partners: Male   Other Topics  Concern    Caffeine Use Not Asked    Occupational Exposure Not Asked    Exercise Not Asked    Seat Belt Not Asked   Social History Narrative    Not on file     Social Determinants of Health     Financial Resource Strain: Not on file   Food Insecurity: No Food Insecurity (09/27/2021)    Yearly Questionnaire     Do you need any assistance with obtaining housing, meals, medication, transportation or medical equipment?: No     Assistance needed for:: Not on file   Transportation Needs: No Transportation Needs (09/27/2021)    Yearly Questionnaire     Do you need any assistance with obtaining housing, meals, medication, transportation or medical equipment?: No     Assistance needed for:: Not on file   Physical Activity: Not on file   Stress: Not on file   Social Connections: Not on file   Intimate Partner Violence: Not on file   Housing Stability: [place]  (09/27/2021)    Yearly Questionnaire     Do you need any assistance with obtaining housing, meals, medication, transportation or medical equipment?: No     Assistance needed for:: Not on file     Current Outpatient Medications   Medication Sig Dispense Refill     acetaminophen (TYLENOL) 500 MG tablet Take 2 tablets (1,000 mg total) by mouth every 8 hours. 60 tablet 1    alendronate (FOSAMAX) 35 MG tablet Take 1 tablet (35 mg total) by mouth every 7 days.      calcium carbonate (OS-CAL) 500 mg calcium (1,250 mg) chewable tablet Chew 1 tablet (1,250 mg total) by mouth daily.      celecoxib (CELEBREX) 200 MG capsule TAKE 1 CAPSULE(200 MG) BY MOUTH TWICE DAILY 60 capsule 0    ibuprofen (MOTRIN) 600 MG tablet Take 1 tablet (600 mg total) by mouth every 8 hours. 60 tablet 1    lactobacillus rhamnosus, GG, (CULTURELLE) 10 billion cell capsule Take 1 capsule by mouth daily.      senna-docusate (SENNOSIDES-DOCUSATE SODIUM) 8.6-50 mg per tablet Take 1 tablet by mouth every 12 hours as needed for Constipation. 60 tablet 0     Current Facility-Administered Medications   Medication Dose Route Frequency Provider Last Rate Last Admin    bupivacaine (MARCAINE) 0.5 % (5 mg/mL) injection 10 mg  2 mL Subcutaneous Once Dorothey Baseman, PA        lidocaine 10 mg/mL (1 %) injection 2 mL  2 mL Subcutaneous Once Dorothey Baseman, [place]        triamcinolone acetonide (KENALOG-40) injection 80 mg  80 mg Intra-articular Once Dorothey Baseman, [place]         Lab Results   Component Value Date    WBC 5.8 10/26/2021    HGB 11.0 (L) 11/10/2021    HCT 31.1 (L) 11/10/2021    MCV 97.3 11/10/2021    PLT 198 10/26/2021     Lab Results   Component Value Date    CREATININE 0.76 04/11/2022    BUN 14 04/11/2022    NA 132 (L) 04/11/2022    K 4.8 04/11/2022    CL 94 (L) 04/11/2022    CO2 30 04/11/2022     ?  Major Predictors:  Recent PCI/MI/unstable angina: no  Decompensated HF or new onset HF: no  Arrhythmias: no  High-grade atrioventricular block    Symptomatic ventricular arrhythmias in the presence of underlying heart disease  Supraventricular arrhythmias with uncontrolled ventricular rate)   Severe heart valve disease: no     RCRI:  High-risk type of surgery (vascular surgery or any open intraperitoneal  or intrathoracic procedures): no  History of ischemic heart disease (history of MI or a positive exercise test, current complaint of chest pain considered to be secondary to myocardial ischemia, use of nitrate therapy, or ECG with pathological Q waves; do not count prior coronary revascularization procedure unless one of the other criteria for ischemic heart disease is present): no  History of HF: no  History of cerebrovascular disease: no  Diabetes mellitus requiring treatment with insulin: no  Preoperative serum creatinine >2: no    RCRI score: zero    Interpretation of Risk Score   Risk class Points Risk of MACE (%)   I. Very low 0 3.9   II. Low 1 6.0   III. Moderate 2 10.1   IV. High 3 + 15.0     Functional Capacity:   Can take care of self, such as eat, dress or use the toilet (1 MET)   Can walk up a flight of steps or a hill (4 METs)   Can do heavy work around the house such as scrubbing floors or lifting or moving heavy furniture (between 4 and 10 METs)   Can participate in strenuous sports such as swimming, singles tennis, football, basketball, and skiing (>10 METs)    No family history of malignant hyperthermia. No know history of bleeding disorder. No previous history of difficulty with anesthesia.      Current Outpatient Medications   Medication Sig Dispense Refill    acetaminophen (TYLENOL) 500 MG tablet Take 2 tablets (1,000 mg total) by mouth every 8 hours. 60 tablet 1    alendronate (FOSAMAX) 35 MG tablet Take 1 tablet (35 mg total) by mouth every 7 days.      calcium carbonate (OS-CAL) 500 mg calcium (1,250 mg) chewable tablet Chew 1 tablet (1,250 mg total) by mouth daily.      lactobacillus rhamnosus, GG, (CULTURELLE) 10 billion cell capsule Take 1 capsule by mouth daily.      senna-docusate (SENNOSIDES-DOCUSATE SODIUM) 8.6-50 mg per tablet Take 1 tablet by mouth every 12 hours as needed for Constipation. 60 tablet 0     Current Facility-Administered Medications   Medication Dose Route Frequency  Provider Last Rate Last Admin    bupivacaine (MARCAINE) 0.5 % (5 mg/mL) injection 10 mg  2 mL Subcutaneous Once [employer], PA        lidocaine 10 mg/mL (1 %) injection 2 mL  2 mL Subcutaneous Once Marissa Lindsey Baum, [place]        triamcinolone acetonide (KENALOG-40) injection 80 mg  80 mg Intra-articular Once Dorothey Baseman, [place]          Review of Systems   Constitutional:  Negative for activity change, appetite change, chills, diaphoresis, fatigue, fever, weight gain and weight loss.   HENT:  Negative for ear pain, facial swelling, hearing loss, sinus pressure and trouble swallowing.    Eyes:  Negative for photophobia, pain, discharge, redness, itching and visual disturbance.   Respiratory:  Negative for apnea, cough, choking, chest tightness, shortness of breath, wheezing and stridor.    Cardiovascular:  Negative for chest pain, palpitations and leg swelling.   Gastrointestinal:  Positive for diarrhea. Negative for abdominal distention, abdominal pain, blood in stool, constipation, heartburn, nausea and vomiting.   Genitourinary:  Negative for difficulty urinating, dysuria, flank pain,  frequency and hematuria.   Musculoskeletal:  Positive for arthralgias. Negative for back pain, gait problem, joint swelling, myalgias, neck pain and neck stiffness.   Skin:  Negative for color change, pallor, rash and wound.   Neurological:  Negative for dizziness, tremors, seizures, syncope, facial asymmetry, speech difficulty, weakness, light-headedness, numbness and headaches.   Hematological:  Negative for adenopathy. Does not bruise/bleed easily.   Psychiatric/Behavioral:  Negative for agitation, behavioral problems, decreased concentration, dysphoric mood and sleep disturbance. The patient is not nervous/anxious and is not hyperactive.    All other systems reviewed and are negative.          Objective   Vitals:    08/11/22 1524   BP: 100/60   Pulse: 87   SpO2: 96%   Weight: (!) 96 lb 1.6 oz (43.6 kg)     Body  mass index is 16.5 kg/m.        Physical Exam  Vitals reviewed.   Constitutional:       Appearance: She is well-developed.   HENT:      Head: Normocephalic and atraumatic.      Nose: Nose normal.      Mouth/Throat:      Pharynx: No oropharyngeal exudate.   Eyes:      General:         Right eye: No discharge.         Left eye: No discharge.      Conjunctiva/sclera: Conjunctivae normal.      Pupils: Pupils are equal, round, and reactive to light.   Neck:      Thyroid: No thyromegaly.   Cardiovascular:      Rate and Rhythm: Normal rate and regular rhythm.      Heart sounds: Normal heart sounds. No murmur heard.     No friction rub. No gallop.   Pulmonary:      Effort: Pulmonary effort is normal. No respiratory distress.      Breath sounds: Normal breath sounds. No wheezing or rales.   Chest:      Chest wall: No tenderness.   Abdominal:      General: Bowel sounds are normal. There is no distension.      Palpations: Abdomen is soft. There is no mass.      Tenderness: There is no abdominal tenderness. There is no guarding or rebound.   Musculoskeletal:         General: No tenderness. Normal range of motion.      Cervical back: Normal range of motion and neck supple.   Skin:     General: Skin is warm and dry.      Findings: No erythema or rash.   Neurological:      Mental Status: She is alert and oriented to person, place, and time.      Cranial Nerves: No cranial nerve deficit.   Psychiatric:         Behavior: Behavior normal.         Thought Content: Thought content normal.         Judgment: Judgment normal.                  Assessment & Plan   Perioperative evaluation:  -patient medically optimized for surgery  -RCRI = zero  -hold NSAID's for 7 days prior to surgery  -labs per surgeon      Number and Complexity of Problems (Level 4)  During this encounter, I addressed 1 undiagnosed new problem with uncertain  prognosis      Amount and/or Complexity of Data Ordered, Reviewed, or Analyzed (Level 2)  I reviewed minimal or  no data.      Risk of Complication and/or Morbidity or Mortality of Patient Management (Level  4)  The patient's management entails Moderate risk of complications and/or morbidity or mortality.    Overall LOS:  4                          Tesla Keeler Jerilynn Mages, MD

## 2022-08-25 NOTE — Other (Signed)
This is a notification of a Admission Alert generated from an ADT received from [place]. This patient was admited to:[facility] QKSK:[id number] Discharge Date: Visit Type:Inpatient Diagnosis:Encounter for other preprocedural   examination Complete rotator cuff tear or rupture of right shoulder, not specified as traumatic

## 2022-08-25 NOTE — Other (Signed)
This is a notification of a Discharge Alert generated from an ADT received from [place]. This patient was admited to:[facility] UORV:[id number] Discharge [phone] Visit Type:Inpatient Diagnosis:Encounter for other   preprocedural examination Complete rotator cuff tear or rupture of right shoulder, not specified as traumatic Other acute postprocedural pain

## 2022-09-02 ENCOUNTER — Ambulatory Visit: Payer: MEDICARE

## 2022-10-28 DIAGNOSIS — S0003XA Contusion of scalp, initial encounter: Secondary | ICD-10-CM

## 2022-10-28 NOTE — ED Triage Notes (Signed)
Pts family came up to desk and said that her mom's ears are feeling full and she is feeling more dizzy.

## 2022-10-28 NOTE — ED Triage Notes (Signed)
Pt comes to ER tonight after falling back from standing and hitting her head. Pt -blood thinners, - loc, a dog jumped on her and knocked her down. Pt is alert and oriented, bleeding seems to be controled. Pt has gauze on site.

## 2022-10-29 ENCOUNTER — Inpatient Hospital Stay: Admit: 2022-10-29 | Discharge: 2022-10-29 | Disposition: A | Discharge status: Home / Self Care | Payer: MEDICARE

## 2022-10-29 ENCOUNTER — Emergency Department: Admit: 2022-10-29 | Payer: MEDICARE

## 2022-10-29 DIAGNOSIS — W19XXXA Unspecified fall, initial encounter: Secondary | ICD-10-CM

## 2022-10-29 LAB — BASIC METABOLIC PANEL
Anion Gap: 10 mmol/L (ref 3–16)
BUN: 15 mg/dL (ref 7–25)
CO2: 25 mmol/L (ref 21–33)
Calcium: 10.2 mg/dL (ref 8.6–10.3)
Chloride: 96 mmol/L — ABNORMAL LOW (ref 98–110)
Creatinine: 0.75 mg/dL (ref 0.60–1.30)
EGFR: 81
Glucose: 119 mg/dL — ABNORMAL HIGH (ref 70–100)
Osmolality, Calculated: 274 mOsm/kg — ABNORMAL LOW (ref 278–305)
Potassium: 4.2 mmol/L (ref 3.5–5.3)
Sodium: 131 mmol/L — ABNORMAL LOW (ref 133–146)

## 2022-10-29 LAB — DIFFERENTIAL
Basophils Absolute: 70 /uL (ref 0–200)
Basophils Relative: 0.6 % (ref 0.0–1.0)
Eosinophils Absolute: 23 /uL (ref 15–500)
Eosinophils Relative: 0.2 % (ref 0.0–8.0)
Lymphocytes Absolute: 1009 /uL (ref 850–3900)
Lymphocytes Relative: 8.7 % — ABNORMAL LOW (ref 15.0–45.0)
Monocytes Absolute: 638 /uL (ref 200–950)
Monocytes Relative: 5.5 % (ref 0.0–12.0)
Neutrophils Absolute: 9860 /uL — ABNORMAL HIGH (ref 1500–7800)
Neutrophils Relative: 85 % — ABNORMAL HIGH (ref 40.0–80.0)
nRBC: 0 /100 WBC (ref 0–0)

## 2022-10-29 LAB — CBC
Hematocrit: 42.7 % (ref 35.0–45.0)
Hemoglobin: 15.1 g/dL (ref 11.7–15.5)
MCH: 34.2 pg — ABNORMAL HIGH (ref 27.0–33.0)
MCHC: 35.3 g/dL (ref 32.0–36.0)
MCV: 96.8 fL (ref 80.0–100.0)
MPV: 6.9 fL — ABNORMAL LOW (ref 7.5–11.5)
Platelets: 210 10*3/uL (ref 140–400)
RBC: 4.42 10*6/uL (ref 3.80–5.10)
RDW: 12.4 % (ref 11.0–15.0)
WBC: 11.6 10*3/uL — ABNORMAL HIGH (ref 3.8–10.8)

## 2022-10-29 LAB — ED HCV AB REFLEX TO HCV QUANT
HCV Ab: NONREACTIVE
HCVAB Number: 0.12 S/CO (ref 0.00–0.79)

## 2022-10-29 LAB — PROTIME-INR
INR: 1 (ref 0.9–1.1)
Protime: 13.8 seconds (ref 12.1–15.1)

## 2022-10-29 MED ORDER — HYDROmorphone (DILAUDID) injection Syrg 0.5 mg
0.5 | Freq: Once | INTRAMUSCULAR | Status: AC
Start: 2022-10-29 — End: 2022-10-29
  Administered 2022-10-29: 05:00:00 0.5 mg via INTRAVENOUS

## 2022-10-29 MED ORDER — diphth,pertus(acell),tetanus (BOOSTRIX TDAP) 2.5-8-5 Lf-mcg-Lf/0.5mL syringe Syrg 0.5 mL
2.5-8-5 | Freq: Once | INTRAMUSCULAR | Status: AC
Start: 2022-10-29 — End: 2022-10-29
  Administered 2022-10-29: 05:00:00 0.5 mL via INTRAMUSCULAR

## 2022-10-29 MED FILL — BOOSTRIX TDAP 2.5 LF UNIT-8 MCG-5 LF/0.5 ML INTRAMUSCULAR SYRINGE: 2.5-8-5 2.5-8-5 Lf-mcg-Lf/0.5mL | INTRAMUSCULAR | Qty: 0.5

## 2022-10-29 MED FILL — HYDROMORPHONE 0.5 MG/0.5 ML INJECTION SYRINGE: 0.5 0.5 mg/0.5 mL | INTRAMUSCULAR | Qty: 0.5

## 2022-10-29 NOTE — ED Provider Notes (Signed)
[name] Note    Date of service:  10/29/2022    Reason for Visit: Fall (Head laceration,-loc, -blood thinners) and Ear Fullness      Patient History   HPI:  [name] is a 77 y.o. female who presents for evaluation of a head injury after a fall.  Reports she was out for a walk when a dog caused her to fall backwards onto her head.  She did not lose consciousness.  She has been able to ambulate since fall.  She did have bleeding and was brought into the [name] by her daughter for reevaluation.  She has no chest pain or shortness of breath.  No abdominal pain.  No nausea or vomiting.  No seizure.  No blood thinners.  Unclear of tetanus being up-to-date.    History was obtained from the patient.    Limitations: none    Past Medical History  Past Medical History:   Diagnosis Date    Acquired absence of ovary, unilateral     Missing left ovary.    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    History of fall     fell 10/2015    Low back pain     Osteoarthritis     Osteoporosis     Urticaria     Varicella        Past Surgical History  Past Surgical History:   Procedure Laterality Date    ARTHROSCOPY SHOULDER Right 02/17/2022    Procedure: Right Shoulder Arthroscopic Balloon Arthroplasty;  Surgeon: Andree Elk, MD;  Location: [facility] OR [facility];  Service: Orthopedics;  Laterality: Right;    CESAREAN SECTION  1985    HARDWARE REMOVAL  8/09    of left tibia plateau    LEG SURGERY  05/09/06    fractured left tibia plateau    RELEASE CARPAL TUNNEL Left 03/22/2016    Procedure: LEFT CARPAL TUNNEL RELEASE;  Surgeon: Kathrene Bongo, MD;  Location: [facility];  Service: Orthopedics;  Laterality: Left;    TOTAL KNEE ARTHROPLASTY Left 11/09/2021    Procedure: LEFT TOTAL KNEE ARTHROPLASTY;  Surgeon: Skipper Cliche, MD;  Location: [facility] OR;  Service: Orthopedics;   Laterality: Left;    WRIST FRACTURE SURGERY Left 11/13/2015    Procedure: OPEN REDUCTION INTERNAL FIXATION LEFT DISTAL RADIUS FRACTURE;  Surgeon: Kathrene Bongo, MD;  Location: [facility] OR;  Service: Orthopedics;  Laterality: Left;       Family History  Family History   Problem Relation Age of Onset    Stroke Mother     Melanoma Neg Hx        Social History  KAISYN BRISK  reports that she has never smoked. She has never used smokeless tobacco. She reports current alcohol use of about 4.0 standard drinks of alcohol per week. She reports that she does not use drugs.    Home Medications  Previous Medications    ACETAMINOPHEN (TYLENOL) 500 MG TABLET    Take 2 tablets (1,000 mg total) by mouth every 8 hours.    ALENDRONATE (FOSAMAX) 35 MG TABLET    Take 1 tablet (35 mg total) by mouth every 7 days.    CALCIUM CARBONATE (OS-CAL) 500 MG CALCIUM (1,250 MG) CHEWABLE TABLET    Chew 1 tablet (1,250 mg  total) by mouth daily.    LACTOBACILLUS RHAMNOSUS, GG, (CULTURELLE) 10 BILLION CELL CAPSULE    Take 1 capsule by mouth daily.    SENNA-DOCUSATE (SENNOSIDES-DOCUSATE SODIUM) 8.6-50 MG PER TABLET    Take 1 tablet by mouth every 12 hours as needed for Constipation.       Allergies:   Allergies as of 10/28/2022 - Fully Reviewed 08/11/2022   Allergen Reaction Noted    Iodine Anaphylaxis, Hives, Shortness Of Breath, Other (See Comments), and Itching 10/01/2013    Iodine and iodide containing products Anaphylaxis 12/18/2015    Iodinated contrast media  12/18/2015    Shellfish containing products  07/21/2014       Review of Systems   ROS:  A comprehensive review of systems was performed and negative except as noted previously in the HPI.    Physical Exam     [name] Triage Vitals   Vital Signs Group      Temp 10/28/22 2113 97.8 F (36.6 C)      Temp Source 10/28/22 2113 Oral      Heart Rate 10/28/22 2113 93      Heart Rate Source 10/28/22 2113 Monitor      Resp 10/28/22 2113 18      SpO2 10/28/22 2113 100 %      BP 10/28/22 2113 (!) 166/107       MAP (mmHg) 10/28/22 2113 120      BP Method 10/28/22 2113 Automatic      BP Location 10/28/22 2113 Right upper arm      BP Cuff Size 10/29/22 0049 Small      Patient Position 10/28/22 2113 Sitting   SpO2 10/28/22 2113 100 %   O2 Device 10/28/22 2113 None (Room air)       Constitutional: 77 y.o. female, well-appearing, NAD  Head: normocephalic, there is a left posterior scalp hematoma with some overlying abrasions but no clear laceration  Eyes: anicteric, PERRL, EOMI   ENT: no nasal discharge, mucous membranes are moist, no hemotympanum bilaterally  Neck: supple, full ROM, trachea midline, no cervical spine tenderness to palpation  Pulmonary: lungs clear to auscultation bilaterally with symmetric aeration, no wheezes/rales/rhonchi, no stridor   Cardiac: regular rate and regular rhythm, no murmurs/rubs/gallops   Abdomen: soft, non-tender, non-distended   Thorax: No chest wall tenderness palpation, no thoracic or lumbar spinal tenderness to palpation  Extremities: warm and well-perfused, no apparent long bone deformity, no peripheral edema, 2+ radial pulses   Skin: no rashes or ecchymosis   Neuro: alert and oriented x3, speech normal, moves upper and lower extremities spontaneously and symmetrically, ambulates without impairment   Psych: mood and affect appropriate for situation       Diagnostic Studies     Labs:  Labs Reviewed   BASIC METABOLIC PANEL - Abnormal; Notable for the following components:       Result Value    Sodium 131 (*)     Chloride 96 (*)     Glucose 119 (*)     Osmolality, Calculated 274 (*)     All other components within normal limits   CBC - Abnormal; Notable for the following components:    WBC 11.6 (*)     MCH 34.2 (*)     MPV 6.9 (*)     All other components within normal limits   DIFFERENTIAL - Abnormal; Notable for the following components:    Neutrophils Relative 85.0 (*)     Lymphocytes Relative 8.7 (*)  Neutrophils Absolute 9,860 (*)     All other components within normal limits    PROTIME-INR   [name] HCV AB REFLEX TO HCV QUANT   PLATELET FUNCTION P2Y12   PLATELET FUNCTION ASPIRIN       Radiology:  CT Head WO contrast   Final Result   IMPRESSION:      Head   1.  Left posterior occipital scalp contusion/laceration.   2.  No acute intracranial hemorrhage or mass effect.   3.  Mild opacification of the right mastoid air cells.       Cervical spine   1.  Cervical degenerative disc disease with no significant compressive abnormality.   2.  No acute fracture or traumatic malalignment at cervical levels           Approved by Doree Albee, MD on 10/29/2022 12:54 AM EDT      I have personally reviewed the images and I agree with this report.      Report Verified by: Lana Fish, MD at 10/29/2022 1:04 AM EDT      CT Cervical spine WO contrast   Final Result   IMPRESSION:      Head   1.  Left posterior occipital scalp contusion/laceration.   2.  No acute intracranial hemorrhage or mass effect.   3.  Mild opacification of the right mastoid air cells.       Cervical spine   1.  Cervical degenerative disc disease with no significant compressive abnormality.   2.  No acute fracture or traumatic malalignment at cervical levels           Approved by Doree Albee, MD on 10/29/2022 12:54 AM EDT      I have personally reviewed the images and I agree with this report.      Report Verified by: Lana Fish, MD at 10/29/2022 1:04 AM EDT          [name] Course and MDM     [name] is a 77 y.o. female with a history and presentation as described above in HPI.  The patient was evaluated by myself, the [name] Attending Physician, Dr. Dartha Lodge, MD. All management and disposition plans were discussed and agreed upon. Appropriate labs and diagnostic studies were reviewed as they were made available. Pertinent laboratory studies in medical decision making are listed below.     [name] is a 77 y.o. female with a history and presentation as noted in the HPI.  Initial impression is notable for a well-appearing  77 year old female in no acute distress.  She presents after mechanical fall when her dog pushed her on the back of her head.  CT imaging of the head and cervical spine were obtained that were negative for any traumatic abnormality.  Basic labs were obtained and reviewed.  Patient has no anemia and a mild leukocytosis at 11.6 which I suspect is reactive.  BMP is mild hyponatremia and hypochloremia which I do not think of contributed to her fall.  Her tetanus was updated in the [name] and she was given Dilaudid 0.5 mg for pain management.  This point feel that she is prepped for discharge home.  She is in strict return precautions for recurrent fall, seizure, vomiting, weakness, numbness, or with any other concerns.         Medical Decision Making  Problems Addressed:  Fall, initial encounter: complicated acute illness or injury  Hematoma of scalp, initial encounter: complicated acute illness or injury  Amount and/or Complexity of Data Reviewed  Independent Historian: caregiver     Details: Daughter  Labs: ordered.  Radiology: ordered.    Risk  Prescription drug management.  Parenteral controlled substances.        Consults:  None    Summary of Treatment in [name]:  Medications   HYDROmorphone (DILAUDID) injection Syrg 0.5 mg (0.5 mg Intravenous Given 10/29/22 0052)   diphth,pertus(acell),tetanus (BOOSTRIX TDAP) 2.5-8-5 Lf-mcg-Lf/0.5mL syringe Syrg 0.5 mL (0.5 mLs Intramuscular Given 10/29/22 0103)         Impression     1. Fall, initial encounter    2. Hematoma of scalp, initial [id number]         Plan   Discharge in stable/improved condition  Patient will be discharged in stable or improved condition. The workup, treatment, and diagnoses were discussed with the patient and/or their family members. The patient agrees to the plan and questions were addressed and answered. The patient is instructed to return to the emergency department should their symptoms worsen or any concern they believes warrants acute physician  evaluation.       Follow-Up/Future Appointments:  Scott D Meyer, MD  [name]  [address]  [place]  [address]  [phone]    Schedule an appointment as soon as possible for a visit       [facility]me[phone]   [place] [address]  [phone]    If symptoms worsen    Future Appo[address] [place]   11/03/2022  9:30 AM Michael PKathrine HaddockUCH GAST [facility] [place]       Jayant Kriz A Foye SpurlingPGY-3 Emergency Medicine    This note was dictated using voice-recognition software, which occasionally leads to inadvertent typographic errors.     Jaydian Santana A Foye SpurlingResident  10/29/22 [phone]

## 2022-10-29 NOTE — Other (Signed)
[facility]  MEDICAL SCREENING EXAM    Date of Service: 10/29/2022    Reason for Visit: Fall (Head laceration,-loc, -blood thinners) and Ear Fullness      MSE Plan     Patient evaluated from the lobby for a medical screening exam.  In short, this is a patient presenting with fall. To further evaluate their complaints, the following orders have been placed:     Labs Reviewed   BASIC METABOLIC PANEL   ED HCV AB REFLEX TO HCV QUANT   CBC   DIFFERENTIAL   PROTIME-INR     CT Head WO contrast    (Results Pending)   CT Cervical spine WO contrast    (Results Pending)     No results found for this or any previous visit (from the past 4160 hour(s)).    Patient moved to a SRU.      Brief HPI     [name] is a 77 y.o. female presents emergency department after mechanical fall.  Patient had a dog jumped on her, causing her to fall backwards and hit her head.  Denies any loss of consciousness.  Is not on any anticoagulation.  Reports increased dizziness and ear fullness on the right side.  No vomiting since the injury.    Pertinent Physical Exam     ED Triage Vitals [10/28/22 2113]   Enc Vitals Group      BP (!) 166/107      Heart Rate 93      Resp 18      Temp 97.8 F (36.6 C)      Temp Source Oral      SpO2 100 %      Weight       Height       Head Circumference       Peak Flow       Pain Score       Pain Loc       Pain Education       Exclude from Growth Chart      Physical Exam  Constitutional:       Appearance: She is well-developed.   HENT:      Head: Atraumatic.      Right Ear: There is hemotympanum.   Eyes:      Extraocular Movements: Extraocular movements intact.      Pupils: Pupils are equal, round, and reactive to light.   Cardiovascular:      Rate and Rhythm: Normal rate and regular rhythm.      Heart sounds: No murmur heard.  Pulmonary:      Effort: Pulmonary effort is normal. No respiratory distress.      Breath sounds: Normal breath sounds.   Abdominal:      General: Abdomen is flat.  There is no distension.      Palpations: Abdomen is soft. There is no hepatomegaly or splenomegaly.      Tenderness: There is no abdominal tenderness.   Musculoskeletal:      Cervical back: Normal range of motion and neck supple.   Skin:     General: Skin is warm.      Findings: No rash.   Neurological:      General: No focal deficit present.      Mental Status: She is alert and oriented to person, place, and time.         Patient History     Medical History:  Past Medical History:   Diagnosis Date    Acquired absence of ovary, unilateral     Missing left ovary.    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    History of fall     fell 10/2015    Low back pain     Osteoarthritis     Osteoporosis     Urticaria     Varicella      Surgical History:   Past Surgical History:   Procedure Laterality Date    ARTHROSCOPY SHOULDER Right 02/17/2022    Procedure: Right Shoulder Arthroscopic Balloon Arthroplasty;  Surgeon: Andree Elk, MD;  Location: [facility] OR [facility];  Service: Orthopedics;  Laterality: Right;    CESAREAN SECTION  1985    HARDWARE REMOVAL  8/09    of left tibia plateau    LEG SURGERY  05/09/06    fractured left tibia plateau    RELEASE CARPAL TUNNEL Left 03/22/2016    Procedure: LEFT CARPAL TUNNEL RELEASE;  Surgeon: Kathrene Bongo, MD;  Location: [facility];  Service: Orthopedics;  Laterality: Left;    TOTAL KNEE ARTHROPLASTY Left 11/09/2021    Procedure: LEFT TOTAL KNEE ARTHROPLASTY;  Surgeon: Skipper Cliche, MD;  Location: [facility] OR;  Service: Orthopedics;  Laterality: Left;    WRIST FRACTURE SURGERY Left 11/13/2015    Procedure: OPEN REDUCTION INTERNAL FIXATION LEFT DISTAL RADIUS FRACTURE;  Surgeon: Kathrene Bongo, MD;  Location: [facility];  Service: Orthopedics;  Laterality: Left;      Medications:   Current Facility-Administered Medications:     bupivacaine (MARCAINE)  0.5 % (5 mg/mL) injection 10 mg, 2 mL, Subcutaneous, Once, [employer], [place]    lidocaine 10 mg/mL (1 %) injection 2 mL, 2 mL, Subcutaneous, Once, [employer], [place]    triamcinolone acetonide (KENALOG-40) injection 80 mg, 80 mg, Intra-articular, Once, Duffy Bruce, [place]    Current Outpatient Medications:     acetaminophen (TYLENOL) 500 MG tablet, Take 2 tablets (1,000 mg total) by mouth every 8 hours., Disp: 60 tablet, Rfl: 1    alendronate (FOSAMAX) 35 MG tablet, Take 1 tablet (35 mg total) by mouth every 7 days., Disp: , Rfl:     calcium carbonate (OS-CAL) 500 mg calcium (1,250 mg) chewable tablet, Chew 1 tablet (1,250 mg total) by mouth daily., Disp: , Rfl:     lactobacillus rhamnosus, GG, (CULTURELLE) 10 billion cell capsule, Take 1 capsule by mouth daily., Disp: , Rfl:     senna-docusate (SENNOSIDES-DOCUSATE SODIUM) 8.6-50 mg per tablet, Take 1 tablet by mouth every 12 hours as needed for Constipation., Disp: 60 tablet, Rfl: 0  Allergies:   Allergies   Allergen Reactions    Iodine Anaphylaxis, Hives, Shortness Of Breath, Other (See Comments) and Itching     IVP dye for a kidney function test greater than 20 years ago    Iodine And Iodide Containing Products Anaphylaxis     Respiratory Distress  IVP dye for a kidney function test greater than 20 years ago  Respiratory Distress    Iodinated Contrast Media      Shortness of breath, hives.    Shellfish Containing Products      Advised to avoid shellfish due to allergy to iodine.       Treyana [name]

## 2022-10-29 NOTE — Other (Incomplete)
[facility]  MEDICAL SCREENING EXAM    Date of Service: 10/29/2022    Reason for Visit: Fall (Head laceration,-loc, -blood thinners) and Ear Fullness      MSE Plan     Patient evaluated from the lobby for a medical screening exam.  In short, this is a patient presenting with fall. To further evaluate their complaints, the following orders have been placed:     Labs Reviewed - No data to display  No orders to display     No results found for this or any previous visit (from the past 4160 hour(s)).    Stable for lobby while awaiting ED bed. See primary provider's note for full details and final disposition.      Brief HPI     [name] is a 77 y.o. female presenting with ***.    Pertinent Physical Exam     ED Triage Vitals [10/28/22 2113]   Enc Vitals Group      BP (!) 166/107      Heart Rate 93      Resp 18      Temp 97.8 F (36.6 C)      Temp Source Oral      SpO2 100 %      Weight       Height       Head Circumference       Peak Flow       Pain Score       Pain Loc       Pain Education       Exclude from Growth Chart      Physical Exam    Patient History     Medical History:   Past Medical History:   Diagnosis Date    Acquired absence of ovary, unilateral     Missing left ovary.    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    History of fall     fell 10/2015    Low back pain     Osteoarthritis     Osteoporosis     Urticaria     Varicella      Surgical History:   Past Surgical History:   Procedure Laterality Date    ARTHROSCOPY SHOULDER Right 02/17/2022    Procedure: Right Shoulder Arthroscopic Balloon Arthroplasty;  Surgeon: Andree Elk, MD;  Location: [facility] OR [facility];  Service: Orthopedics;  Laterality: Right;    CESAREAN SECTION  1985    HARDWARE REMOVAL  8/09    of left tibia plateau    LEG SURGERY  05/09/06     fractured left tibia plateau    RELEASE CARPAL TUNNEL Left 03/22/2016    Procedure: LEFT CARPAL TUNNEL RELEASE;  Surgeon: Kathrene Bongo, MD;  Location: [facility];  Service: Orthopedics;  Laterality: Left;    TOTAL KNEE ARTHROPLASTY Left 11/09/2021    Procedure: LEFT TOTAL KNEE ARTHROPLASTY;  Surgeon: Skipper Cliche, MD;  Location: [facility] OR;  Service: Orthopedics;  Laterality: Left;    WRIST FRACTURE SURGERY Left 11/13/2015    Procedure: OPEN REDUCTION INTERNAL FIXATION LEFT DISTAL RADIUS FRACTURE;  Surgeon: Kathrene Bongo, MD;  Location: [facility];  Service: Orthopedics;  Laterality: Left;      Medications:   Current Facility-Administered Medications:  bupivacaine (MARCAINE) 0.5 % (5 mg/mL) injection 10 mg, 2 mL, Subcutaneous, Once, Marissa Lindsey [employer]aine 10 mg/mL (1 %) injection 2 mL, 2 mL, Subcutaneous, Onc[employer]y Ba[place]um, [place]    triamcinolone acetonide (KENALOG-40) injection 80 mg, 80 mg, Intra-articular, Once, Duffy Bruce, [place]    Current Outpatient Medications:     acetaminophen (TYLENOL) 500 MG tablet, Take 2 tablets (1,000 mg total) by mouth every 8 hours., Disp: 60 tablet, Rfl: 1    alendronate (FOSAMAX) 35 MG tablet, Take 1 tablet (35 mg total) by mouth every 7 days., Disp: , Rfl:     calcium carbonate (OS-CAL) 500 mg calcium (1,250 mg) chewable tablet, Chew 1 tablet (1,250 mg total) by mouth daily., Disp: , Rfl:     lactobacillus rhamnosus, GG, (CULTURELLE) 10 billion cell capsule, Take 1 capsule by mouth daily., Disp: , Rfl:     senna-docusate (SENNOSIDES-DOCUSATE SODIUM) 8.6-50 mg per tablet, Take 1 tablet by mouth every 12 hours as needed for Constipation., Disp: 60 tablet, Rfl: 0  Allergies:   Allergies   Allergen Reactions    Iodine Anaphylaxis, Hives, Shortness Of Breath, Other (See Comments) and Itching     IVP dye for a kidney function test greater than 20 years ago    Iodine And Iodide Containing Products Anaphylaxis     Respiratory Distress  IVP dye for a kidney  function test greater than 20 years ago  Respiratory Distress    Iodinated Contrast Media      Shortness of breath, hives.    Shellfish Containing Products      Advised to avoid shellfish due to allergy to iodine.       Lorien Shingler

## 2022-10-29 NOTE — Discharge Instructions (Signed)
You were seen in the emergency department after a fall.  The CT scan did not show any bleed in your brain or fracture of the skull.  Please follow-up with your primary care provider for reevaluation.  Return to the emergency department sooner for difficulty walking, vomiting, weakness or numbness, seizure, or with any other concerns.

## 2022-10-29 NOTE — ED Notes (Signed)
AVS discussed with pt, no questions at this time. PIV removed. Pt ambulated to lobby in stable condition.

## 2022-10-29 NOTE — ED Provider Notes (Addendum)
ED Attending Attestation Note    Date of service:  10/29/2022     This patient was seen by the Resident, Sobocinski.  I have seen and examined the patient, agree with the workup, evaluation, management and diagnosis. The care plan has been discussed and I concur.     [name] is a 77 y.o. female who presents with head injury.  Patient reports she was standing in the street talking with her neighbor when her neighbor's dog jumped up on her and she fell backward, striking her head.  No LOC.  Reports headache.  Denies neck or back pain.  Denies chest or abdominal pain.  Denies extremity pain.  Unsure of last tetanus.  On exam she is GCS 15.  Has abrasion to posterior scalp with tenderness there.  There is no cervical, thoracic or lumbar spinous process tenderness or stepoff.  There is no paravertebral swelling or deformity.  Extremities without deformity and NVI.  Breath sounds clear, abdomen is soft and nontender.      CRITICAL CARE TIME    I have seen and examined this patient and provided 39 minutes of critical care time exclusive of separately billed procedures and treating other patients.  Critical care was necessary to treat or prevent imminent or life threatening deterioration of the following condition(s): trauma    Critical care time was spent personally by me on the following activities: discussions with consultants, evaluation of patient's resonse to treatment, ordering and performing treatments and interventions, ordering and review of laboratory studies, ordering and review of radiographic studies, re-evaluation of patient's condition, and review of old charts.

## 2022-10-30 ENCOUNTER — Emergency Department: Payer: Medicare (Managed Care)

## 2022-10-30 ENCOUNTER — Other Ambulatory Visit: Payer: Self-pay

## 2022-10-30 ENCOUNTER — Emergency Department
Admission: EM | Admit: 2022-10-30 | Discharge: 2022-10-30 | Disposition: A | Discharge status: Home / Self Care | Payer: Medicare (Managed Care) | Attending: Student in an Organized Health Care Education/Training Program | Admitting: Student in an Organized Health Care Education/Training Program

## 2022-10-30 DIAGNOSIS — W25XXXA Contact with sharp glass, initial encounter: Secondary | ICD-10-CM | POA: Diagnosis not present

## 2022-10-30 DIAGNOSIS — S91332A Puncture wound without foreign body, left foot, initial encounter: Secondary | ICD-10-CM | POA: Diagnosis not present

## 2022-10-30 DIAGNOSIS — Z23 Encounter for immunization: Secondary | ICD-10-CM | POA: Insufficient documentation

## 2022-10-30 DIAGNOSIS — T148XXA Other injury of unspecified body region, initial encounter: Secondary | ICD-10-CM

## 2022-10-30 MED ORDER — TETANUS-DIPHTH-ACELL PERTUSSIS 5-2.5-18.5 LF-MCG/0.5 IM SUSY
0.5000 mL | PREFILLED_SYRINGE | Freq: Once | INTRAMUSCULAR | Status: AC
  Administered 2022-10-30: 0.5 mL via INTRAMUSCULAR
  Filled 2022-10-30: qty 0.5

## 2022-10-30 MED ORDER — DOXYCYCLINE MONOHYDRATE 100 MG PO TABS: 100.0000 mg | ORAL_TABLET | Freq: Two times a day (BID) | ORAL | 0 refills | Status: AC

## 2022-10-30 NOTE — ED Triage Notes (Signed)
Patient arrived via EMS from home. States she dropped a glass bottle of vinegar and stepped on a piece of glass. States she feels glass is stuck in left foot. AOX4. Speaking in full clear sentences. Denies falling.

## 2022-10-30 NOTE — ED Provider Notes (Signed)
[facility] Provider Note  Patient Contact: 7:09 PM (approximate)   History   Laceration   HPI  [name] is a 77 y.o. female presents to the emergency department after patient dropped a glass bottle of vinegar.  She has 2 superficial appearing puncture wounds along the plantar aspect of her left foot.  Patient has had some pain with attempted weightbearing.  No other alleviating measures have been attempted.      Physical Exam   Triage Vital Signs: ED Triage Vitals  Enc Vitals Group     BP      Pulse      Resp      Temp      Temp src      SpO2      Weight      Height      Head Circumference      Peak Flow      Pain Score      Pain Loc      Pain Edu?      Excl. in GC?     Most recent vital signs: Vitals:   10/30/22 1909  BP: (!) 148/67  Pulse: 62  Resp: 20  Temp: 98.8 F (37.1 C)  SpO2: 99%     General: Alert and in no acute distress. Eyes:  PERRL. EOMI. Head: No acute traumatic findings ENT:      Nose: No congestion/rhinnorhea.      Mouth/Throat: Mucous membranes are moist. Neck: No stridor. No cervical spine tenderness to palpation. Cardiovascular:  Good peripheral perfusion Respiratory: Normal respiratory effort without tachypnea or retractions. Lungs CTAB. Good air entry to the bases with no decreased or absent breath sounds. Gastrointestinal: Bowel sounds 4 quadrants. Soft and nontender to palpation. No guarding or rigidity. No palpable masses. No distention. No CVA tenderness. Musculoskeletal: Full range of motion to all extremities.  Neurologic:  No gross focal neurologic deficits are appreciated.  Skin: Patient has superficial appearing puncture wounds along the plantar aspect of the left foot.    ED Results / Procedures / Treatments   Labs (all labs ordered are listed, but only abnormal results are displayed) Labs Reviewed - No data to display      RADIOLOGY  I personally viewed and evaluated these  images as part of my medical decision making, as well as reviewing the written report by the radiologist.  ED Provider Interpretation: No radiopaque foreign bodies visualized on x-ray of the left foot.   PROCEDURES:  Critical Care performed: No  Procedures   MEDICATIONS ORDERED IN ED: Medications  Tdap (BOOSTRIX) injection 0.5 mL (has no administration in time range)     IMPRESSION / MDM / ASSESSMENT AND PLAN / ED COURSE  I reviewed the triage vital signs and the nursing notes.                              Assessment and plan: Puncture wounds:  77 year old female presents to the emergency department with superficial puncture wounds along the plantar aspect of the left foot.  No acute bony abnormality was visualized on x-ray of the left foot.  No retained foreign bodies.  Patient's wound was cleansed in the emergency department and patient's tetanus status was updated.  Patient was discharged with doxycycline given cephalosporin allergy.  Patient education regarding wound care was given.     FINAL CLINICAL IMPRESSION(S) / ED DIAGNOSES   Final diagnoses:  Puncture wound     Rx / DC Orders   ED Discharge Orders          Ordered    doxycycline (ADOXA) 100 MG tablet  2 times daily        10/30/22 1954             Note:  This document was prepared using Dragon voice recognition software and may include unintentional dictation errors.   Pia Mau [place], PA-C 10/30/22 1956    Willy Eddy, MD 11/02/22 [phone]

## 2022-10-30 NOTE — Discharge Instructions (Addendum)
Keep wound clean and dry for the next 24 hours. Take Doxycyline twice daily for the next seven days.

## 2022-11-01 ENCOUNTER — Ambulatory Visit: Admit: 2022-11-01 | Discharge: 2022-11-01 | Payer: MEDICARE

## 2022-11-01 ENCOUNTER — Inpatient Hospital Stay: Admit: 2022-11-01 | Discharge: 2022-11-16 | Payer: PRIVATE HEALTH INSURANCE

## 2022-11-01 ENCOUNTER — Other Ambulatory Visit: Admit: 2022-11-01 | Payer: MEDICARE

## 2022-11-01 DIAGNOSIS — W19XXXD Unspecified fall, subsequent encounter: Secondary | ICD-10-CM

## 2022-11-01 DIAGNOSIS — H9071 Mixed conductive and sensorineural hearing loss, unilateral, right ear, with unrestricted hearing on the contralateral side: Secondary | ICD-10-CM

## 2022-11-01 LAB — CBC
Hematocrit: 43.1 % (ref 35.0–45.0)
Hemoglobin: 15.1 g/dL (ref 11.7–15.5)
MCH: 34 pg — ABNORMAL HIGH (ref 27.0–33.0)
MCHC: 35 g/dL (ref 32.0–36.0)
MCV: 97 fL (ref 80.0–100.0)
MPV: 7.3 fL — ABNORMAL LOW (ref 7.5–11.5)
Platelets: 217 10*3/uL (ref 140–400)
RBC: 4.44 10*6/uL (ref 3.80–5.10)
RDW: 12 % (ref 11.0–15.0)
WBC: 5.7 10*3/uL (ref 3.8–10.8)

## 2022-11-01 LAB — DIFFERENTIAL
Basophils Absolute: 29 /uL (ref 0–200)
Basophils Relative: 0.5 % (ref 0.0–1.0)
Eosinophils Absolute: 34 /uL (ref 15–500)
Eosinophils Relative: 0.6 % (ref 0.0–8.0)
Lymphocytes Absolute: 701 /uL — ABNORMAL LOW (ref 850–3900)
Lymphocytes Relative: 12.3 % — ABNORMAL LOW (ref 15.0–45.0)
Monocytes Absolute: 445 /uL (ref 200–950)
Monocytes Relative: 7.8 % (ref 0.0–12.0)
Neutrophils Absolute: 4492 /uL (ref 1500–7800)
Neutrophils Relative: 78.8 % (ref 40.0–80.0)
nRBC: 0 /100 WBC (ref 0–0)

## 2022-11-01 LAB — COMPREHENSIVE METABOLIC PANEL, SERUM
ALT: 9 U/L (ref 7–52)
AST (SGOT): 18 U/L (ref 13–39)
Albumin: 4.5 g/dL (ref 3.5–5.7)
Alkaline Phosphatase: 83 U/L (ref 36–125)
Anion Gap: 9 mmol/L (ref 3–16)
BUN: 15 mg/dL (ref 7–25)
CO2: 27 mmol/L (ref 21–33)
Calcium: 9.8 mg/dL (ref 8.6–10.3)
Chloride: 96 mmol/L — ABNORMAL LOW (ref 98–110)
Creatinine: 0.74 mg/dL (ref 0.60–1.30)
EGFR: 83
Glucose: 96 mg/dL (ref 70–100)
Osmolality, Calculated: 275 mOsm/kg — ABNORMAL LOW (ref 278–305)
Potassium: 4.7 mmol/L (ref 3.5–5.3)
Sodium: 132 mmol/L — ABNORMAL LOW (ref 133–146)
Total Bilirubin: 0.4 mg/dL (ref 0.0–1.5)
Total Protein: 6.9 g/dL (ref 6.4–8.9)

## 2022-11-01 NOTE — Patient Instructions (Signed)
Drink plenty of fluids  Limit your screen time  Follow up with your PCP in 2 weeks.   Follow up with ENT

## 2022-11-01 NOTE — Progress Notes (Signed)
[facility]  [facility] OFFICE  [address]  [place] [address]  Subjective   Name:  [name] Date of Birth: September 13, 1945 (77 y.o.)   MRN: [id number]    Date of Service:  11/01/2022      Chief Complaint   Patient presents with    Follow-up     Pt was pushed over by a large dog Fri evening hit head on pavement. She has a really bad HA on both side of the back. Not sleeping well. She does feels foggy     Fall    Head Injury    Shoulder Injury     R shoulder she is 10 wks post op for a TRS. She is stating her shoulder is very sore.     Hearing Loss     Pt cannot hear out of her R ear at all     Results     Pt would like to go over her blood work results for ER      History of Present Illness:  [name] is a(n) 77 y.o. female here today for the following:     Pt. here today for an urgent visit. Med, problem and allergy list updated today.     Pt. Here for ER folow up.  Was knocked to ground by a large dog on Friday evenging.  Evalauted in ER with CT scan and labs.      CT scan did not show any bleeds.  S/p shoulder replaement surgery 10 weeks ago,states shoulder is sore.     Has headache post trauma likely concussion. Sustained laceration to scalp.     Also states noted loss of hearing in right ear. No LOC.  Lots of abrasions of back      Stated headache is feeling better the past couple days, but still feels a bit foggy.  Able to relate the events of Friday easily and language/speech is fluent                  Current Outpatient Medications   Medication Sig Dispense Refill    acetaminophen (TYLENOL) 500 MG tablet Take 2 tablets (1,000 mg total) by mouth every 8 hours. 60 tablet 1    alendronate (FOSAMAX) 35 MG tablet Take 1 tablet (35 mg total) by mouth every 7 days.      calcium carbonate (OS-CAL) 500 mg calcium (1,250 mg) chewable tablet Chew 1 tablet (1,250 mg total) by mouth daily.      lactobacillus rhamnosus, GG, (CULTURELLE) 10 billion  cell capsule Take 1 capsule by mouth daily.      senna-docusate (SENNOSIDES-DOCUSATE SODIUM) 8.6-50 mg per tablet Take 1 tablet by mouth every 12 hours as needed for Constipation. 60 tablet 0     Current Facility-Administered Medications   Medication Dose Route Frequency Provider Last Rate Last Admin    bupivacaine (MARCAINE) 0.5 % (5 mg/mL) injection 10 mg  2 mL Subcutaneous Once [employer], PA        lidocaine 10 mg/mL (1 %) injection 2 mL  2 mL Subcutaneous Once Marissa Lindsey Baum, [place]        triamcinolone acetonide (KENALOG-40) injection 80 mg  80 mg Intra-articular Once Duffy Bruce, [place]          Review of Systems   Constitutional:  Negative for activity change, appetite change, chills, fatigue and fever.   HENT:  Negative for ear pain,  facial swelling, nosebleeds, sneezing and tinnitus.    Eyes:  Negative for pain, discharge and itching.   Respiratory:  Negative for apnea, cough, choking, chest tightness and shortness of breath.    Cardiovascular:  Negative for chest pain, palpitations and leg swelling.   Gastrointestinal:  Negative for abdominal distention, abdominal pain, constipation, nausea and vomiting.   Genitourinary:  Negative for difficulty urinating, dyspareunia, dysuria and flank pain.   Musculoskeletal:  Negative for arthralgias, back pain, myalgias and neck pain.        See hpi    Skin:  Negative for color change and rash.   Neurological:  Negative for dizziness, seizures, facial asymmetry, light-headedness, numbness and headaches.   Hematological:  Negative for adenopathy. Does not bruise/bleed easily.   Psychiatric/Behavioral:  Negative for agitation and behavioral problems.            Objective   Vitals:    11/01/22 1123   BP: 144/80   Pulse: 94   Temp: 98.7 F (37.1 C)   SpO2: 97%   Weight: (!) 96 lb (43.5 kg)     Body mass index is 16.48 kg/m.        Physical Exam  Vitals and nursing note reviewed.   Constitutional:       Appearance: Normal appearance.   HENT:       Head: Normocephalic.      Right Ear: There is no impacted cerumen.      Left Ear: There is no impacted cerumen.      Ears:      Comments: Blood in right ear canal:    Eyes:      Extraocular Movements: Extraocular movements intact.      Conjunctiva/sclera: Conjunctivae normal.      Pupils: Pupils are equal, round, and reactive to light.   Cardiovascular:      Rate and Rhythm: Normal rate and regular rhythm.   Pulmonary:      Effort: Pulmonary effort is normal.      Breath sounds: Normal breath sounds.   Skin:     General: Skin is warm and dry.      Comments: Abrasions of back.  Right shoulder sore.    Neurological:      General: No focal deficit present.      Mental Status: She is alert and oriented to person, place, and time. Mental status is at baseline.      Cranial Nerves: No cranial nerve deficit.      Motor: No weakness.      Deep Tendon Reflexes: Reflexes normal.   Psychiatric:         Mood and Affect: Mood normal.         Behavior: Behavior normal.         Thought Content: Thought content normal.         Judgment: Judgment normal.                Assessment & Plan                                Fortino Sic, MD

## 2022-11-01 NOTE — Assessment & Plan Note (Addendum)
Due to dog pushing her over.  Mechanical fall.   Contusions to back  Head trauma noted and some blood in right ear canal.     States headache has improved though still tender over top of head and right base of skull.     Will recheck labs that were slighlty off.  Also check right shoulder xray to see if any hardware issues.     Finally refer to ent due to blood in canal and hearing loss.     Patient understands and agrees with plan.

## 2022-11-01 NOTE — Assessment & Plan Note (Signed)
Acute hearing loss.  May be due to trauma:  some blood in canal.   Will refer to ENT.     Patient understands and agrees with plan.

## 2022-11-03 ENCOUNTER — Ambulatory Visit: Admit: 2022-11-03 | Discharge: 2022-11-03 | Payer: MEDICARE

## 2022-11-03 ENCOUNTER — Ambulatory Visit: Admit: 2022-11-03 | Discharge: 2022-11-07 | Payer: PRIVATE HEALTH INSURANCE

## 2022-11-03 ENCOUNTER — Ambulatory Visit: Admit: 2022-11-03 | Discharge: 2022-11-08 | Payer: MEDICARE | Attending: Audiologist

## 2022-11-03 DIAGNOSIS — H9201 Otalgia, right ear: Secondary | ICD-10-CM

## 2022-11-03 DIAGNOSIS — R197 Diarrhea, unspecified: Secondary | ICD-10-CM

## 2022-11-03 DIAGNOSIS — H9120 Sudden idiopathic hearing loss, unspecified ear: Secondary | ICD-10-CM

## 2022-11-03 DIAGNOSIS — R198 Other specified symptoms and signs involving the digestive system and abdomen: Secondary | ICD-10-CM

## 2022-11-03 MED ORDER — predniSONE (DELTASONE) 20 MG tablet
20 | ORAL_TABLET | ORAL | 0 refills | Status: AC
Start: 2022-11-03 — End: ?

## 2022-11-03 MED ORDER — ofloxacin (FLOXIN) 0.3 % otic solution
0.3 | Freq: Every day | OTIC | 0 refills | 10.00000 days | Status: AC
Start: 2022-11-03 — End: ?

## 2022-11-03 MED ORDER — psyllium (KONSYL) Powd
ORAL | 3 refills | Status: AC
Start: 2022-11-03 — End: ?

## 2022-11-03 MED ORDER — polyethylene glycol (MIRALAX) 17 gram/dose powder
17 | Freq: Every day | ORAL | 2 refills | 14.00000 days | Status: AC
Start: 2022-11-03 — End: 2023-04-02

## 2022-11-03 NOTE — Progress Notes (Signed)
Vitals:    11/03/22 1653   Weight: (!) 94 lb (42.6 kg)   Height: 5' 4 (1.626 m)       Chief Complaint   Patient presents with    Hearing Loss       Cancer Staging:  Cancer Staging   No matching staging information was found for the patient.      Subjective   History of Present Illness:  Patient ID: [name] is a 77 y.o. female presents to the clinic for evaluation of hearing loss of right ear.     Patient presented to ED on 10/29/22 after being pushed over by a large dog causing her to fall down and hit head on the pavement. Did not lose consciousness. She did have bleeding however.     Followed up with PCP on 11/01/22 and noted hearing loss of right ear and blood in the right ear canal.     Noticed hearing loss of right ear right after the fall.     Head CT was done in the ED showing no bleeds but :  1.  Left posterior occipital scalp contusion/laceration.   2.  No acute intracranial hemorrhage or mass effect.   3.  Mild opacification of the right mastoid air cells.     Feels hearing is improving but still not a 100%. States hearing loss was not great even before the fall.     Family history of hearing loss: No  Better hearing ear: Left. 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   Any ototoxic medications: No  History of hearing aid use: No  Tinnitus: No  History of ear infections: No  Prior history of ear surgery: No  Head Trauma: Yes  Prior CT/MRI: Yes  Dizziness - frequency, duration, positional: No  Falls: Yes    Otorrhea:No  Aural fullness: Yes, right   Otalgia: No    Headache still present from concussion but improving since initial onset.     Objective   Past Medical History:  She has a past medical history of Acquired absence of ovary, unilateral, Carpal tunnel syndrome on left (03/18/2016), Closed fracture of upper end of tibia (12/18/2009), Congenital absence of one kidney, History of fall, Low back pain, Osteoarthritis, Osteoporosis, Urticaria, and Varicella.  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  Social History:  She reports that she has never smoked. She has never used smokeless tobacco. She reports current alcohol use of about 4.0 standard drinks of alcohol per week. She reports that she does not use drugs.    Physical Exam:  General:  Well-developed, well-nourished female in no acute distress.  I was able to converse well with the patient.  The patient is able to answer questions adequately and appropriately.  Psychiatric:  NL mood and affect.  A+O x 4.  Neck: Overall appearance NL.    Head and Face: AT-NC.   Eye:  Ocular motility NL.  No erythema or infection.  Nose:  External nose without infection or abnormality.     OC/OP:  Lips are symmetric and in good condition.   Skin:  Visual examination of the scalp, neck, and face reveals bruises along the back of her head with abrasion noted in central aspect of the back of  head.   Ears:  External ears have a normal appearance with no masses, lesions, or scars.  Cardiovascular:  Warm and well perfused.  Respiratory: normal effort, no acute distress.   Neurologic:  Facial nerve function grade I.  Other cranial nerves grossly WNL.     Procedure Note:  Micro-Otoscopy:  - Right: EAC abnormal with scant dried blood; TM flat, mobile, and light reflex is present in anterior inferior quadrant; middle ear clear  - Left: EAC normal; TM flat, mobile, and light reflex is present in anterior inferior quadrant; middle ear clear    Testing:  - 11/03/22 Audiogram:    AD: Mild to profound mixed hearing loss. Fair WRS in quiet at 45 dB 76%.  AS: Low normal to mild SNHL 125-3kHz,moderate to severe SNHL 4-8 kHz. Excellent WRS in quiet at 35 dB 96%.   AU: Normal ME pressure and  compliance (type A)    Imaging:  Head CT 10/29/22:    FINDINGS:    Adequate diagnostic quality.    Brain parenchyma: Scattered foci of nonspecific white matter low-attenuation. Gray-white differentiation. No intraparenchymal hemorrhage or mass effect.    Ventricles and extraaxial spaces: Normal ventricular system. No extra-axial fluid collection.    Orbits, paranasal sinuses, mastoids: No acute orbital abnormality. Minimal layering fluid in the right sphenoid sinus. Mucus retention cyst within the left maxillary sinus. Mild opacification of the right mastoid air cells.    Extracranial soft tissues: Left posterior occipital scalp contusion/laceration.    Calvarium and skull base: No fracture or suspicious osseous lesion.    Other: Atherosclerotic vascular calcifications.    Cervical alignment: Grade 1 anterolisthesis of C4 on C5. Mild retrolisthesis of C5 on C6.    Cervical osseous structures: No fracture. Moderate multilevel disc height loss and osteophytes. No suspicious marrow lesion.     Level details:    Multiple small disc osteophyte complexes noted at the levels of C2-C3, C3-C4, C4-C5, C5-C6, and C6-C7 without significant central canal narrowing. Multilevel neural foraminal narrowing secondary to uncovertebral and facet joint arthrosis.    Extraspinal structures: Atherosclerotic disease with no neck mass or adenopathy included. Biapical lung scarring and no suspicious pulmonary nodule.    Impression   IMPRESSION:    Head  1.  Left posterior occipital scalp contusion/laceration.  2.  No acute intracranial hemorrhage or mass effect.  3.  Mild opacification of the right mastoid air cells.    Cervical spine  1.  Cervical degenerative disc disease with no significant compressive abnormality.  2.  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 Given that she has a  dropped WRS at 76% in the right ear compared to left, I would like to start a high dose steroid taper to help with concerns of a sudden hearing loss and any inflammation that could be around the auditory nerve.   -Start high dose steroid (Prednisone) taper. Educated on possible side effects.   -Start floxin drops to right ear for scant blood noted to right EAC  -Follow up 2 weeks with repeat hearing test   -Hold on additional imaging at this time but after steroid treatment if there is no improvement we could consider CT temporal bones vs. MRI of head to look at the mixed hearing loss of right ear.   -Patient stated understanding  and in agreement with today's plan.       77 y.o. White or Caucasian female  No diagnosis found.    Activation code not generated  Current My  Status: Active     LOS:  No LOS data to display

## 2022-11-03 NOTE — Patient Instructions (Signed)
X-ray can be done today on 1st floor     Start taking psyllium husk (Metamucil) daily - 1 tbsp in 8 oz water or other fluid    Start taking 1 capful Miralax daily

## 2022-11-03 NOTE — Progress Notes (Signed)
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

## 2022-11-03 NOTE — Addendum Note (Signed)
Addended by: Jolyn Lent on: 11/03/2022 10:21 AM     Modules accepted: Orders

## 2022-11-03 NOTE — Progress Notes (Signed)
[facility]    New Patient Visit  Nurse Practitioner    CONSULT - Reason for Visit:  Diarrhea.  Requesting Physician: [name], MD.    CHIEF COMPLAINT:  Diarrhea    HISTORY OF PRESENT ILLNESS:   [name] is a 77 y.o. female who is referred for the following: Diarrhea. Ms. Altizer states she has had issues with her bowels since a TKR in 11/2021. She had been constipated after the surgery but it never seemed to get better. She started taking Senna and now has been having intermittent diarrhea. Previous bowel pattern was formed stool daily.    Currently, she moves her bowels every 1-2 weeks. She may have smaller bowel movements between larger loose episodes. She never feels like she completely empties. She denies hematochezia, melena, change in stool caliber, IDA, unintentional weight loss, abdominal pain, prior abdominal surgery, fever.     Denies any dysphagia, odynophagia, reflux, regurgitation, nausea. Denies any prior liver, pancreatic or gallbladder disease.      No history of anemia.  No sleep apnea. No narcotics. No history of anesthesia complications.    01/03/2022 Negative C. Diff    Last Colonoscopy: 01/01/2002  Normal. Dr. Deborah Chalk at [facility]. Next recommended 5 years     Upper Endoscopy: never  Family History of Gastrointestinal Malignancy: none     Past Medical History:   Past Medical History:   Diagnosis Date    Acquired absence of ovary, unilateral     Missing left ovary.    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    History of fall     fell 10/2015    Low back pain     Osteoarthritis     Osteoporosis     Urticaria     Varicella        Past Surgical History:   Past Surgical History:   Procedure Laterality Date    ARTHROSCOPY SHOULDER Right 02/17/2022    Procedure: Right Shoulder Arthroscopic Balloon Arthroplasty;   Surgeon: Andree Elk, MD;  Location: [facility];  Service: Orthopedics;  Laterality: Right;    CESAREAN SECTION  1985    HARDWARE REMOVAL  8/09    of left tibia plateau    LEG SURGERY  05/09/06    fractured left tibia plateau    RELEASE CARPAL TUNNEL Left 03/22/2016    Procedure: LEFT CARPAL TUNNEL RELEASE;  Surgeon: Kathrene Bongo, MD;  Location: [facility];  Service: Orthopedics;  Laterality: Left;    TOTAL KNEE ARTHROPLASTY Left 11/09/2021    Procedure: LEFT TOTAL KNEE ARTHROPLASTY;  Surgeon: Skipper Cliche, MD;  Location: [facility] OR;  Service: Orthopedics;  Laterality: Left;    WRIST FRACTURE SURGERY Left 11/13/2015    Procedure: OPEN REDUCTION INTERNAL FIXATION LEFT DISTAL RADIUS FRACTURE;  Surgeon: Kathrene Bongo, MD;  Location: [facility];  Service: Orthopedics;  Laterality: Left;       Current Medications:    Current Outpatient Medications   Medication Sig    acetaminophen Take 2 tablets (1,000 mg total) by mouth every 8 hours.    alendronate Take 1 tablet (35 mg total) by mouth every 7 days.    calcium carbonate Chew 1 tablet (1,250 mg total) by mouth daily.    lactobacillus rhamnosus (GG) Take 1 capsule  by mouth daily.    senna-docusate Take 1 tablet by mouth every 12 hours as needed for Constipation.     Current Facility-Administered Medications   Medication Dose Frequency Provider Last Admin    BUPivacaine HCl  2 mL Once Duffy Bruce, PA      lidocaine  2 mL Once [employer], [place]      triamcinolone acetonide  80 mg Once [employer], [place]         ALLERGIES:  Iodine, Iodine and iodide containing products, Iodinated contrast media, and Shellfish containing products    SOCIAL HISTORY:    Social History     Socioeconomic History    Marital status: Widowed     Spouse name: Not on file    Number of children: Not on file    Years of education: Not on file    Highest education level: Not on file   Occupational History    Not on file   Tobacco Use    Smoking status: Never    Smokeless tobacco: Never    Vaping Use    Vaping status: Never Used   Substance and Sexual Activity    Alcohol use: Yes     Alcohol/week: 4.0 standard drinks of alcohol     Types: 4 Glasses of wine per week     Comment: ocasionally    Drug use: No     Comment: 12-23-2009    Sexual activity: Not Currently     Partners: Male   Other Topics Concern    Caffeine Use Not Asked    Occupational Exposure Not Asked    Exercise Not Asked    Seat Belt Not Asked   Social History Narrative    Not on file     Social Determinants of Health     Financial Resource Strain: Not on file   Food Insecurity: No Food Insecurity (11/01/2022)    Yearly Questionnaire     Do you need any assistance with obtaining housing, meals, medication, transportation or medical equipment?: No     Assistance needed for:: Not on file   Transportation Needs: No Transportation Needs (11/01/2022)    Yearly Questionnaire     Do you need any assistance with obtaining housing, meals, medication, transportation or medical equipment?: No     Assistance needed for:: Not on file   Physical Activity: Not on file   Stress: Not on file   Social Connections: Not on file   Intimate Partner Violence: Unknown (06/27/2022)    [facility]     Interpersonal Safety     Feel physically or emotionally unsafe where currently live: Not on file     Harm by anyone: Not on file     Emotionally Harmed: Not on file   Housing Stability: Low Risk  (11/01/2022)    Yearly Questionnaire     Do you need any assistance with obtaining housing, meals, medication, transportation or medical equipment?: No     Assistance needed for:: Not on file       FAMILY HISTORY:   Family History   Problem Relation Age of Onset    Stroke Mother     Melanoma Neg Hx        ASSESSMENTS:   BODY HABITUS AND NUTRITION STATUS:    There is no height or weight on file to calculate BMI.    REVIEW OF SYSTEMS:  (For the following ROS, pertinent positives are in bold; pertinent negatives are  in italics.)  CONSTITUTIONAL: fevers,  chills, sweats, weight loss, fatigue  ENMT: voice changes, sore throat  CV: chest pain, palpitations  RESP: cough, dyspnea  GI: see HPI  GU: hematuria, dysuria, frequency, urgency, abnormal vaginal bleeding  MSKL: pain, muscle weakness, joint pain  SKIN: rash, pain, pruritis  HEME/LYMPH: bruising, bleeding  NEURO: headache, dizziness  PSYCH: depression, anxiety    PHYSICAL EXAM:     Vital Signs:  There were no vitals taken for this visit.    CONSTITUTIONAL: WD/WN, in NAD, afebrile  NECK: supple, symmetrical, trachea midline  LUNGS: no increased work of breathing   CARDIOVASCULAR: RRR  ABDOMEN: no scars, normal BS, soft, NT, ND, no rebound or guarding, no hepatomegaly, no masses  MUSCULOSKELETAL: no LE edema  SKIN: no rashes and no jaundice  HEMATOLOGIC: no bleeding or bruising   NEURO: normal gait, aaox3    LABS:   Lab Results   Component Value Date    WBC 11.6 (H) 10/29/2022    HGB 15.1 10/29/2022    HCT 42.7 10/29/2022    MCV 96.8 10/29/2022    PLT 210 10/29/2022     Lab Results   Component Value Date    GLUCOSE 119 (H) 10/29/2022    BUN 15 10/29/2022    CREATININE 0.75 10/29/2022    K 4.2 10/29/2022    PHOS 4.3 04/11/2022    ALBUMIN 4.2 04/11/2022    EGFR 81 10/29/2022     Lab Results   Component Value Date    ALKPHOS 96 01/24/2022    ALT 9 01/24/2022    AST 24 12/30/2009    AST 24 12/30/2009    BILITOT 0.5 01/24/2022    ALBUMIN 4.2 04/11/2022    PROT 6.5 01/24/2022    LABGLOB 2.5 12/30/2009    LABGLOB 2.5 12/30/2009     No results found for: IRON, TIBC, FERRITIN     IMAGING and ENDOSCOPY:   As Noted in the HPI     ASSESSMENT AND PLAN:    Welda R JohnsAINSLEIGH DROHANy.o. fem13ale who is referred for the following: Diarrhea. Ms. Sachdeva Saadates she has had issues with her bowels since a TKR in 11/2021.  It sounds like she is having more constipation with overflow diarrhea.  She will need a KUB to assess for stool burden.  I encouraged her to start taking Metamucil and MiraLAX daily.  She is overdue for screening  colonoscopy.  We will hold off on scheduling any endoscopy at this time.  Will focus on improving her bowel pattern.  If her symptoms persist then we will discuss the need for a colonoscopy at her next office visit.  Patient was agreeable with this plan.    Follow up: 3 months    This document was dictated using Conservation officer, historic buildings.  While every effort is made to proofread the document as it is being created, please excuse any transcription errors encountered in reading.    Kathrine Haddock, APRN  [facility]   [phone]

## 2022-11-04 ENCOUNTER — Inpatient Hospital Stay: Admit: 2022-11-04 | Payer: MEDICARE

## 2022-11-04 DIAGNOSIS — R198 Other specified symptoms and signs involving the digestive system and abdomen: Secondary | ICD-10-CM

## 2022-11-14 ENCOUNTER — Ambulatory Visit: Payer: MEDICARE

## 2022-11-17 ENCOUNTER — Ambulatory Visit: Admit: 2022-11-17 | Discharge: 2022-11-17 | Payer: PRIVATE HEALTH INSURANCE | Attending: Audiologist

## 2022-11-17 ENCOUNTER — Ambulatory Visit: Admit: 2022-11-17 | Discharge: 2022-12-03 | Payer: MEDICARE

## 2022-11-17 DIAGNOSIS — H9120 Sudden idiopathic hearing loss, unspecified ear: Secondary | ICD-10-CM

## 2022-11-17 DIAGNOSIS — H903 Sensorineural hearing loss, bilateral: Secondary | ICD-10-CM

## 2022-11-17 MED ORDER — ofloxacin (FLOXIN) 0.3 % otic solution
0.3 | Freq: Every day | OTIC | 0 refills | 10.00000 days | Status: AC
Start: 2022-11-17 — End: ?

## 2022-11-17 MED ORDER — fluticasone propionate (FLONASE) 50 mcg/actuation nasal spray
50 | Freq: Every day | NASAL | 1 refills | Status: AC
Start: 2022-11-17 — End: 2022-11-29

## 2022-11-17 NOTE — Progress Notes (Signed)
Vitals:    11/17/22 1146   BP: 138/85   BP Location: Left upper arm   Patient Position: Sitting   BP Cuff Size: Small   Pulse: 66   SpO2: 93%   Weight: (!) 92 lb (41.7 kg)   Height: 5' 5.5 (1.664 m)       Chief Complaint   Patient presents with    Sudden hearing loss after trauma     F/u       Cancer Staging:   Cancer Staging   No matching staging information was found for the patient.      Subjective   History of Present Illness:  Patient ID: [name] is a 77 y.o. female presents to the clinic for evaluation of hearing loss of right ear.     Patient presented to ED on 10/29/22 after being pushed over by a large dog causing her to fall down and hit head on the pavement. Did not lose consciousness. She did have bleeding however.     Followed up with PCP on 11/01/22 and noted hearing loss of right ear and blood in the right ear canal.     Noticed hearing loss of right ear right after the fall.     Head CT was done in the ED showing no bleeds but :  1.  Left posterior occipital scalp contusion/laceration.   2.  No acute intracranial hemorrhage or mass effect.   3.  Mild opacification of the right mastoid air cells.     Feels hearing is improving but still not a 100%. States hearing loss was not great even before the fall.     Family history of hearing loss: No  Better hearing ear: Left. 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   Any ototoxic medications: No  History of hearing aid use: No  Tinnitus: No  History of ear infections: No  Prior history of ear surgery: No  Head Trauma: Yes  Prior CT/MRI: Yes  Dizziness - frequency, duration, positional: No  Falls: Yes    Otorrhea:No  Aural fullness: Yes, right   Otalgia: No    Headache still present from concussion but improving since initial onset.    Interval History 11/17/22:    Has since completed her oral steroid course     Feels there is slight improvement of the hearing     States head just feels Unclear .    Still feeling clogged in  right ear     Objective   Past Medical History:  She has a past medical history of Acquired absence of ovary, unilateral, Carpal tunnel syndrome on left (03/18/2016), Closed fracture of upper end of tibia (12/18/2009), Congenital absence of one kidney, History of fall, Low back pain, Osteoarthritis, Osteoporosis, Urticaria, and Varicella.  Past Surgical History:  She has a past surgical history that includes Cesarean section (1985); Leg Surgery (05/09/06); Hardware Removal (8/09); Wrist fracture surgery (Left, 11/13/2015); release carpal tunnel (Left, 03/22/2016); Total knee arthroplasty (Left, 11/09/2021); and arthroscopy shoulder (Right, 02/17/2022).  Social History:  She reports that she has never smoked. She has never used smokeless tobacco. She reports current alcohol use of about 4.0 standard drinks of alcohol per week. She reports that she does not use drugs.    Physical Exam:  General:  Well-developed, well-nourished female in no acute distress.  I was able to converse well with the patient.  The patient is able to answer questions adequately and appropriately.  Psychiatric:  NL mood and affect.  A+O x 4.  Neck: Overall appearance NL.    Head and Face: AT-NC.   Eye:  Ocular motility NL.  No erythema or infection.  Nose:  External nose without infection or abnormality.     OC/OP:  Lips are symmetric and in good condition.   Skin:  Visual examination of the scalp, neck, and face reveals well healing  bruises along the back of her head with abrasion noted in central aspect of the back of head.   Ears:  External ears have a normal appearance with no masses, lesions, or scars.  Cardiovascular:  Warm and well perfused.  Respiratory: normal effort, no acute distress.   Neurologic:  Facial nerve function grade I.  Other cranial nerves grossly WNL.     Procedure Note:  Micro-Otoscopy:  - Right: EAC abnormal with scant dried blood; TM flat, mobile, and light reflex is present in anterior inferior quadrant; middle ear  clear  - Left: EAC normal; TM flat, mobile, and light reflex is present in anterior inferior quadrant; middle ear clear    Testing:  - 11/03/22 Audiogram:    AD: Mild to profound mixed hearing loss. Fair WRS in quiet at 45 dB 76%.  AS: Low normal to mild SNHL 125-3kHz,moderate to severe SNHL 4-8 kHz. Excellent WRS in quiet at 35 dB 96%.   AU: Normal ME pressure and compliance (type A)      11/17/22 Audiogram:    AD: Mild to profound mostly SNHL (Conductive component at 4 kHz). Good WRS in quiet at 30 dB 88%.    AS: Low normal to severe SNHL. Excellent WRS in quiet ar 30 dB 92%.       Imaging:  Head CT 10/29/22:    FINDINGS:    Adequate diagnostic quality.    Brain parenchyma: Scattered foci of nonspecific white matter low-attenuation. Gray-white differentiation. No intraparenchymal hemorrhage or mass effect.    Ventricles and extraaxial spaces: Normal ventricular system. No extra-axial fluid collection.    Orbits, paranasal sinuses, mastoids: No acute orbital abnormality. Minimal layering fluid in the right sphenoid sinus. Mucus retention cyst within the left maxillary sinus. Mild opacification of the right mastoid air cells.    Extracranial soft tissues: Left posterior occipital scalp contusion/laceration.    Calvarium and skull base: No fracture or suspicious osseous lesion.    Other: Atherosclerotic vascular calcifications.    Cervical alignment: Grade 1 anterolisthesis of C4 on C5. Mild retrolisthesis of C5 on C6.    Cervical osseous structures: No fracture. Moderate multilevel disc height loss and osteophytes. No suspicious marrow lesion.     Level details:    Multiple small disc osteophyte complexes noted at the levels of C2-C3, C3-C4, C4-C5, C5-C6, and C6-C7 without significant central canal narrowing. Multilevel neural foraminal narrowing secondary to uncovertebral and facet joint arthrosis.    Extraspinal structures: Atherosclerotic disease with no neck mass or adenopathy included. Biapical lung scarring and  no suspicious pulmonary nodule.    Impression   IMPRESSION:    Head  1.  Left posterior occipital scalp contusion/laceration.  2.  No acute intracranial hemorrhage or mass effect.  3.  Mild opacification of the right mastoid air cells.    Cervical spine  1.  Cervical degenerative disc disease with no significant compressive abnormality.  2.  No acute fracture or traumatic malalignment at cervical levels           Assessment and Plan:    Will talk  with otology team and get in touch with patient about next steps.     Start daily saline rinses and flonase for ear fullness.     Continue floxin drops for the next 10 days to help with blood in the [facility] after dried blood removal.      77 y.o. White or Caucasian female  No diagnosis found.    Activation code not generated  Current My [place] Status: Active     LOS:  No LOS data to display

## 2022-11-17 NOTE — Progress Notes (Signed)
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

## 2022-11-18 NOTE — Telephone Encounter (Signed)
Received refill request from pt's pharmacy for 90 days supply of Flonase.

## 2022-11-22 NOTE — Telephone Encounter (Signed)
Pt called stating ABV told her after her visit on 11/17/2022 he was going to discuss pt's treatment plan with another provider at our practice and call her either last Friday or yesterday. Pt has not received a call and is hoping ABV will call her ASAP. Pt can be reached at [phone].

## 2022-11-29 DIAGNOSIS — H903 Sensorineural hearing loss, bilateral: Secondary | ICD-10-CM

## 2022-11-29 MED ORDER — fluticasone propionate (FLONASE) 50 mcg/actuation nasal spray
50 | Freq: Every day | NASAL | 1 refills | Status: AC
Start: 2022-11-29 — End: ?

## 2022-11-29 NOTE — Telephone Encounter (Signed)
ABV sent pt MyChart message on 11/22/2022.

## 2022-11-29 NOTE — Telephone Encounter (Signed)
Patient has been contacted twice between last week and today to try and discuss future plans and treatment options. Nothing urgent/emergent at this time but I left a message for the patient to provide a time she would be free to have a quick phone call. Thanks.

## 2022-12-07 NOTE — Telephone Encounter (Signed)
Called patient in response to her My Chart message on 12/03/22 requesting a sooner appointment. Patient is now scheduled with Kathrine Haddock, APRN on 12/13/22 at 12:30 PM at the [address] [facility] location, 6th floor. Patient verbalized understanding and agrees with the plan.

## 2022-12-13 ENCOUNTER — Ambulatory Visit: Admit: 2022-12-13 | Discharge: 2022-12-13 | Payer: MEDICARE

## 2022-12-13 DIAGNOSIS — R198 Other specified symptoms and signs involving the digestive system and abdomen: Secondary | ICD-10-CM

## 2022-12-13 NOTE — Patient Instructions (Addendum)
Continue taking Metamucil daily    After Miralax clean out decrease Miralax to 1/2 capful every other day    Miralax Clean Out:  Do on a day where you will be home all day and near a bathroom.  By 9:00am - Mix 7 caps of Miralax in 32 oz of liquid (water, gatorade, juice, flavored water - no soda or milk). Make sure it is dissolved well and some patients like it better served very cold. Drink this full amount within 4-5 hours.  You will know the clean out was successful when 2-3 consecutive stools are clear liquid and the color of tea or [facility]. There will be a few visible brown specks/flakes and no pieces of stool.  If you do not see clear stool then repeat the entire clean out the following day. Let us know if the clean out is not complete in 2 days.  Have a light diet during clean out (nothing greasy or fatty). Ensure good hydration during the day- I would recommend something with electrolytes such as gatorade (just not red color).      Schedule Colonoscopy  Take GoLytely prep - 1/2 night before colonoscopy- 1/2 morning of colonoscopy  Take medication that you usually take in the morning    Follow up with Kathrine Haddock, CNP 1 month after Colonoscopy    For questions or concerns, send a MyChart message or call your GI nurse coordinator, Alfred Levins, RN at [phone]. Our office hours are Monday-Friday, 8:00am - 4:30pm.

## 2022-12-13 NOTE — Progress Notes (Unsigned)
[facility]   Return Office Visit  Nurse Practitioner    CHIEF COMPLAINT:  No chief complaint on file.    [name] is a 77 y.o. female who is referred for the following: Diarrhea. Ms. Florio states she has had issues with her bowels since a TKR in 11/2021.  It sounds like she is having more constipation with overflow diarrhea.  She will need a KUB to assess for stool burden.  I encouraged her to start taking Metamucil and MiraLAX daily.  She is overdue for screening colonoscopy.  We will hold off on scheduling any endoscopy at this time.  Will focus on improving her bowel pattern.  If her symptoms persist then we will discuss the need for a colonoscopy at her next office visit.  Patient was agreeable with this plan.     HISTORY OF PRESENT ILLNESS:    [name] is a 77 y.o. female who is seen in follow up.    Patient was last seen on 11/03/2022 for Alternating C/D, Bloating.    Currently, she moves her bowels ***. She denies a recent change in bowel habits, hematochezia, melena, change in stool caliber. No unintentional weight loss, abdominal pain.     Denies any dysphagia, odynophagia, reflux, regurgitation, nausea. Denies any prior liver, pancreatic or gallbladder disease.      No history of anemia.  No sleep apnea. No narcotics. No history of anesthesia complications.    KUB: 11/04/2022  Moderate stool burden     01/03/2022 Negative C. Diff     Last Colonoscopy: 01/01/2002  Normal. Dr. Deborah Chalk at [facility]. Next recommended 5 years    Upper Endoscopy: never  Family History of Gastrointestinal Malignancy: none       REVIEW OF SYSTEMS:   (For the following ROS, pertinent positives are in bold; pertinent negatives are in italics.)  CONSTITUTIONAL: fevers, chills, sweats, weight loss, fatigue  CV: chest pain, dyspnea, palpitations  RESP: cough, dyspnea  GI: see HPI    Past Medical History:   Past Medical History:   Diagnosis Date    Acquired absence of ovary, unilateral     Missing left ovary.     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    History of fall     fell 10/2015    Low back pain     Osteoarthritis     Osteoporosis     Urticaria     Varicella        Past Surgical History:   Past Surgical History:   Procedure Laterality Date    ARTHROSCOPY SHOULDER Right 02/17/2022    Procedure: Right Shoulder Arthroscopic Balloon Arthroplasty;  Surgeon: Andree Elk, MD;  Location: [facility] OR [facility];  Service: Orthopedics;  Laterality: Right;    CESAREAN SECTION  1985    HARDWARE REMOVAL  8/09    of left tibia plateau    LEG SURGERY  05/09/06    fractured left tibia plateau    RELEASE CARPAL TUNNEL Left 03/22/2016    Procedure: LEFT CARPAL TUNNEL RELEASE;  Surgeon: Kathrene Bongo, MD;  Location: [facility] OR;  Service: Orthopedics;  Laterality: Left;    TOTAL KNEE ARTHROPLASTY Left 11/09/2021    Procedure: LEFT TOTAL KNEE ARTHROPLASTY;  Surgeon: Skipper Cliche, MD;  Location: [facility] OR;  Service: Orthopedics;  Laterality: Left;    WRIST FRACTURE SURGERY Left 11/13/2015    Procedure: OPEN REDUCTION INTERNAL FIXATION LEFT DISTAL RADIUS FRACTURE;  Surgeon: Kathrene Bongo, MD;  Location: [facility] OR;  Service: Orthopedics;  Laterality: Left;       Current Medications:    Meds Reviews and Updated     ALLERGIES:  Iodine, Iodine and iodide containing products, Iodinated contrast media, and Shellfish containing products    SOCIAL HISTORY:    Social History     Socioeconomic History    Marital status: Widowed     Spouse name: Not on file    Number of children: Not on file    Years of education: Not on file    Highest education level: Not on file   Occupational History    Not on file   Tobacco Use    Smoking status: Never    Smokeless tobacco: Never   Vaping Use    Vaping status: Never Used   Substance and Sexual Activity    Alcohol use: Yes     Alcohol/week: 4.0 standard drinks of alcohol      Types: 4 Glasses of wine per week     Comment: ocasionally    Drug use: No     Comment: 12-23-2009    Sexual activity: Not Currently     Partners: Male   Other Topics Concern    Caffeine Use Not Asked    Occupational Exposure Not Asked    Exercise Not Asked    Seat Belt Not Asked   Social History Narrative    Not on file     Social Determinants of Health     Financial Resource Strain: Not on file   Food Insecurity: No Food Insecurity (11/01/2022)    Yearly Questionnaire     Do you need any assistance with obtaining housing, meals, medication, transportation or medical equipment?: No     Assistance needed for:: Not on file   Transportation Needs: No Transportation Needs (11/01/2022)    Yearly Questionnaire     Do you need any assistance with obtaining housing, meals, medication, transportation or medical equipment?: No     Assistance needed for:: Not on file   Physical Activity: Not on file   Stress: Not on file   Social Connections: Not on file   Intimate Partner Violence: Unknown (06/27/2022)    [facility]     Interpersonal Safety     Feel physically or emotionally unsafe where currently live: Not on file     Harm by anyone: Not on file     Emotionally Harmed: Not on file   Housing Stability: Low Risk  (11/01/2022)    Yearly Questionnaire     Do you need any assistance with obtaining housing, meals, medication, transportation or medical equipment?: No     Assistance needed for:: Not on file        FAMILY HISTORY:   Family History   Problem Relation Age of Onset    Stroke Mother     Melanoma Neg Hx        PHYSICAL EXAM:   Vital Signs:  There were no vitals taken for this visit.  There is no height or weight on file to calculate BMI.    CONSTITUTIONAL: WD/WN, in NAD  LUNGS: no increased work of breathing   CARDIOVASCULAR: RRR  ABDOMEN: no scars, normal bowel sounds, soft, non-distended, non-tender, no rebound or guarding, no hepatomegaly   MUSCULOSKELETAL: there  is no redness, warmth, or  swelling of the joints  SKIN: no redness, warmth, or swelling, no rashes and no jaundice    Lab Results   Component Value Date    WBC 5.7 11/01/2022    HGB 15.1 11/01/2022    HCT 43.1 11/01/2022    MCV 97.0 11/01/2022    PLT 217 11/01/2022     Lab Results   Component Value Date    GLUCOSE 96 11/01/2022    BUN 15 11/01/2022    CREATININE 0.74 11/01/2022    K 4.7 11/01/2022    PHOS 4.3 04/11/2022    ALBUMIN 4.5 11/01/2022    EGFR 83 11/01/2022     Lab Results   Component Value Date    ALKPHOS 83 11/01/2022    ALT 9 11/01/2022    AST 24 12/30/2009    AST 24 12/30/2009    BILITOT 0.4 11/01/2022    ALBUMIN 4.5 11/01/2022    PROT 6.9 11/01/2022    LABGLOB 2.5 12/30/2009    LABGLOB 2.5 12/30/2009     No results found for: IRON, TIBC, FERRITIN    DIAGNOSTIC STUDIES REVIEWED:    As Noted in the HPI     ASSESSMENT AND PLAN:  TRISTEN BERMEA is a 77 y.o. female who is seen in follow up.        Return to Clinic:     This document was dictated using Conservation officer, historic buildings.  While every effort is made to proofread the document as it is being created, please excuse any transcription errors encountered in reading.    Kathrine Haddock, APRN  [facility]   [phone]

## 2022-12-13 NOTE — Telephone Encounter (Signed)
Called and scheduled patient for COLO on 01-30-2023 at 10:30am      Confirmed that patient is not on any kind of anticoagulation therapy. Instructed on clear liquid diet as well as split dose Golytely prep. Instructed to arrive at [facility] at 9am  with escort. Directions given, verbalized understanding. Written instructions and directions provided.

## 2022-12-14 MED ORDER — polyethylene glycol (GOLYTELY) 236-22.74-6.74 -5.86 gram solution
236-22.74-6.74 | ORAL | 0 refills | 14.00000 days | Status: AC | PRN
Start: 2022-12-14 — End: 2022-12-15

## 2023-01-19 NOTE — Progress Notes (Signed)
Voice-mail left for Pt trying to confirm colo appointment on 8/26 with arrival time of 0900. Left details to pick up prep as soon as possible, follow the clear liquid diet, to have Pt arrange a ride for after procedure with responsible adult, and call back number of [phone] for any questions about appointment.

## 2023-01-26 NOTE — Progress Notes (Signed)
Patient confirms 9am arrival on 01/30/23, daughter will drop her off but will leave phone on. No questions on diet or prep

## 2023-01-30 ENCOUNTER — Ambulatory Visit: Payer: MEDICARE | Attending: Gastroenterology

## 2023-01-30 MED ORDER — propofol 10 mg/ml (DIPRIVAN) injection
10 | INTRAVENOUS | PRN
Start: 2023-01-30 — End: 2023-01-30
  Administered 2023-01-30: 15:00:00 via INTRAVENOUS

## 2023-01-30 MED ORDER — sodium chloride 0.9 % IV infusion
INTRAVENOUS
Start: 2023-01-30 — End: 2023-01-30
  Administered 2023-01-30: 15:00:00 via INTRAVENOUS

## 2023-01-30 MED ORDER — lidocaine (PF) 20 mg/mL (2 %) Soln
20 | INTRAVENOUS | PRN
Start: 2023-01-30 — End: 2023-01-30
  Administered 2023-01-30: 15:00:00 via INTRAVENOUS

## 2023-01-30 MED ORDER — propofol 10 mg/ml, 20mL vial (DIPRIVAN) INFUSION
10 | INTRAVENOUS | PRN
Start: 2023-01-30 — End: 2023-01-30
  Administered 2023-01-30: 15:00:00 via INTRAVENOUS

## 2023-01-30 NOTE — Anesthesia Pre-Procedure Evaluation (Signed)
[name]  DEPARTMENT OF ANESTHESIOLOGY  PRE-PROCEDURAL EVALUATION    [name] is a 77 y.o. year old female presenting for:    Procedure(s):  COLONOSCOPY W/ OR W/O BIOPSY    Surgeon:   Jeannene Patella, MD    Chief Complaint     encounter for screening colonoscopy    Review of Systems     Anesthesia Evaluation    Patient summary reviewed.       No history of anesthetic complications   I have reviewed the History and Physical Exam, any relevant changes are noted in the anesthesia pre-operative evaluation.      Cardiovascular:    Exercise tolerance: good            (-) hypertension, past MI, CAD, dysrhythmias, angina.    Neuro/Muscoloskeletal/Psych:    (+) neuromuscular disease, arthritis and back problem.      (-) seizures, TIA, CVA.     Pulmonary:              (-) COPD, asthma, shortness of breath, recent URI.       GI/Hepatic/Renal:          Chronic renal disease (solo kidney, congenitally).  (-) GERD.    Comments: Hyponatremia to 130s, not symptomatic    Endo/Other:          (-) diabetes mellitus, hypothyroidism, hyperthyroidism, no anemia, no thrombocytopenia.        Past Medical History     Past Medical History:   Diagnosis Date    Acquired absence of ovary, unilateral     Missing left ovary.    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    History of fall     fell 10/2015    Low back pain     Osteoarthritis     Osteoporosis     Urticaria     Varicella        Past Surgical History     Past Surgical History:   Procedure Laterality Date    ARTHROSCOPY SHOULDER Right 02/17/2022    Procedure: Right Shoulder Arthroscopic Balloon Arthroplasty;  Surgeon: Andree Elk, MD;  Location: [facility] OR [facility];  Service: Orthopedics;  Laterality: Right;    CESAREAN SECTION  1985    HARDWARE REMOVAL  8/09    of left tibia plateau    LEG SURGERY  05/09/06     fractured left tibia plateau    RELEASE CARPAL TUNNEL Left 03/22/2016    Procedure: LEFT CARPAL TUNNEL RELEASE;  Surgeon: Kathrene Bongo, MD;  Location: [facility];  Service: Orthopedics;  Laterality: Left;    TOTAL KNEE ARTHROPLASTY Left 11/09/2021    Procedure: LEFT TOTAL KNEE ARTHROPLASTY;  Surgeon: Skipper Cliche, MD;  Location: [facility] OR;  Service: Orthopedics;  Laterality: Left;    WRIST FRACTURE SURGERY Left 11/13/2015    Procedure: OPEN REDUCTION INTERNAL FIXATION LEFT DISTAL RADIUS FRACTURE;  Surgeon: Kathrene Bongo, MD;  Location: [facility] OR;  Service: Orthopedics;  Laterality: Left;       Family History     Family History   Problem Relation Age of Onset    Stroke Mother     Melanoma Neg Hx        Social History     Social History  Socioeconomic History    Marital status: Widowed     Spouse name: Not on file    Number of children: Not on file    Years of education: Not on file    Highest education level: Not on file   Occupational History    Not on file   Tobacco Use    Smoking status: Never    Smokeless tobacco: Never   Vaping Use    Vaping status: Never Used   Substance and Sexual Activity    Alcohol use: Yes     Alcohol/week: 4.0 standard drinks of alcohol     Types: 4 Glasses of wine per week     Comment: ocasionally    Drug use: No     Comment: 12-23-2009    Sexual activity: Not Currently     Partners: Male   Other Topics Concern    Caffeine Use Yes    Occupational Exposure No    Exercise Yes    Seat Belt Yes   Social History Narrative    Not on file     Social Determinants of Health     Financial Resource Strain: Not on file   Food Insecurity: No Food Insecurity (11/01/2022)    Yearly Questionnaire     Do you need any assistance with obtaining housing, meals, medication, transportation or medical equipment?: No     Assistance needed for:: Not on file   Transportation Needs: No Transportation Needs (11/01/2022)    Yearly Questionnaire     Do you need any assistance with obtaining housing,  meals, medication, transportation or medical equipment?: No     Assistance needed for:: Not on file   Physical Activity: Not on file   Stress: Not on file   Social Connections: Not on file   Intimate Partner Violence: Unknown (06/27/2022)    [facility]     Interpersonal Safety     Feel physically or emotionally unsafe where currently live: Not on file     Harm by anyone: Not on file     Emotionally Harmed: Not on file   Housing Stability: Low Risk  (11/01/2022)    Yearly Questionnaire     Do you need any assistance with obtaining housing, meals, medication, transportation or medical equipment?: No     Assistance needed for:: Not on file       Medications     Allergies:  Allergies   Allergen Reactions    Iodine Anaphylaxis, Hives, Shortness Of Breath, Other (See Comments) and Itching     IVP dye for a kidney function test greater than 20 years ago    Iodine And Iodide Containing Products Anaphylaxis     Respiratory Distress  IVP dye for a kidney function test greater than 20 years ago  Respiratory Distress    Iodinated Contrast Media      Shortness of breath, hives.    Shellfish Containing Products      Advised to avoid shellfish due to allergy to iodine.       Home Meds:  Prior to Admission medications as of 01/26/23 1147   Medication Sig Taking?   acetaminophen (TYLENOL) 500 MG tablet Take 2 tablets (1,000 mg total) by mouth every 8 hours.    alendronate (FOSAMAX) 35 MG tablet Take 1 tablet (35 mg total) by mouth every 7 days.    calcium carbonate (OS-CAL) 500 mg calcium (1,250 mg) chewable tablet Chew 1 tablet (1,250 mg total) by mouth daily.  fluticasone propionate (FLONASE) 50 mcg/actuation nasal spray shake liquid and use 2 sprays in each nostril daily    lactobacillus rhamnosus, GG, (CULTURELLE) 10 billion cell capsule Take 1 capsule by mouth daily.    ofloxacin (FLOXIN) 0.3 % otic solution Place 5 drops into the right ear daily.    ofloxacin (FLOXIN) 0.3 % otic solution Place  5 drops into the right ear daily.    polyethylene glycol (MIRALAX) 17 gram/dose powder Take 17 g by mouth daily. Indications: constipation    predniSONE (DELTASONE) 20 MG tablet Take 3 tablets ( 60 mg) once daily for seven days. Then take 2 tablets ( 40 mg) once daily for three days. Then take 1 tablets ( 20 mg) once daily for two days. Then take 1/2 tablet ( 10 mg) once daily for two days.    psyllium (KONSYL) Powd Take 1 tbsp in 8 oz water daily    senna-docusate (SENNOSIDES-DOCUSATE SODIUM) 8.6-50 mg per tablet Take 1 tablet by mouth every 12 hours as needed for Constipation.        Inpatient Meds:  Scheduled:   Continuous:     PRN:     Vital Signs     Wt Readings from Last 3 Encounters:   12/13/22 (!) 95 lb (43.1 kg)   11/17/22 (!) 92 lb (41.7 kg)   11/03/22 (!) 94 lb (42.6 kg)     Ht Readings from Last 3 Encounters:   12/13/22 5' 5 (1.651 m)   11/17/22 5' 5.5 (1.664 m)   11/03/22 5' 4 (1.626 m)     Temp Readings from Last 3 Encounters:   11/01/22 98.7 F (37.1 C) (Temporal)   10/28/22 97.8 F (36.6 C) (Oral)   02/17/22 97.5 F (36.4 C) (Tympanic)     BP Readings from Last 3 Encounters:   12/13/22 119/73   11/17/22 138/85   11/03/22 (!) 158/92     Pulse Readings from Last 3 Encounters:   12/13/22 76   11/17/22 66   11/03/22 85     @LASTSAO2 (3)@    Physical Exam     Airway:     Mallampati: II    Dental:   - No obvious cracked, loose, chipped, or missing teeth.     Pulmonary:   - normal exam     Cardiovascular:  - normal exam     Neuro/Musculoskeletal/Psych:     Mental status: alert and oriented to person, place and time.          Abdominal:    - normal exam    Current OB Status:       Other Findings:        Laboratory Data     Lab Results   Component Value Date    WBC 5.7 11/01/2022    HGB 15.1 11/01/2022    HCT 43.1 11/01/2022    MCV 97.0 11/01/2022    PLT 217 11/01/2022       No results found for: [facility]    Lab Results   Component Value Date    GLUCOSE 96 11/01/2022    BUN 15 11/01/2022    CO2 27  11/01/2022    CREATININE 0.74 11/01/2022    K 4.7 11/01/2022    NA 132 (L) 11/01/2022    CL 96 (L) 11/01/2022    CALCIUM 9.8 11/01/2022    ALBUMIN 4.5 11/01/2022    PROT 6.9 11/01/2022    ALKPHOS 83 11/01/2022    ALT 9 11/01/2022    AST  24 12/30/2009    AST 24 12/30/2009    BILITOT 0.4 11/01/2022       Lab Results   Component Value Date    INR 1.0 10/29/2022       No results found for: PREGTESTUR, PREGSERUM, HCG, HCGQUANT    Anesthesia Plan     ASA 2         Female and current non-smoker    Anesthesia Type:  MAC.      PONV Risk Factors: female, current non-smoker,  plan for postoperative opioid use.              Induction:   Intravenous induction.    (Anesthetic Plan:   Type/Airway: MAC  Access: PIV access x1  Analgesia: multimodal analgesia including PO adjuncts & IV opiates if warranted  Monitors: standard ASA monitors  Dispo: Endoscopy Recovery )  Anesthetic plan and risks discussed with patient.    Plan, alternatives, and risks of anesthesia, including death, have been explained to and discussed with the patient/legal guardian.  By my assessment, the patient/legal guardian understands and agrees.  Scenario presented in detail.  Questions answered.    Use of blood products discussed with patient who.  Blood products not discussed.      Plan discussed with CRNA and attending.

## 2023-01-30 NOTE — Op Note (Signed)
[id number]  _______________________________________________________________________________  Procedure Date: 01/30/2023 10:38 AM    Patient Name: [name]  MRN: [id number]                         Account Number: [id number]  Date of Birth: 08/25/1945               Admit Type: Outpatient  Age: 77                               Gender: Female  Note Status: Finalized                Attending MD: Jeannene Patella , , [id number]  _______________________________________________________________________________     Procedure:             Colonoscopy  Indications:           Screening for colorectal malignant neoplasm  Providers:             Jeannene Patella  Referring MD:          Jeannene Patella  Medicines:             Monitored Anesthesia Care  Complications:         No immediate complications.  _______________________________________________________________________________  Procedure:             Pre-Anesthesia Assessment:                         - Prior to the procedure, a History and Physical was                          performed, and patient medications and allergies were                          reviewed. The risks and benefits of the procedure and                          the sedation options and risks were discussed with the                          patient. All questions were answered and informed                          consent was obtained. Patient identification and                          proposed procedure were verified by the physician, the                          nurse, the anesthesiologist and the anesthetist in the                          pre-procedure area in the procedure room. Mental                          Status Examination: alert and oriented. Airway                          Examination: normal  oropharyngeal airway and neck                          mobility. Respiratory Examination: clear to                          auscultation. CV Examination: normal. Prophylactic                          Antibiotics: The  patient does not require prophylactic                          antibiotics. Prior Anticoagulants: The patient has                          taken no anticoagulant or antiplatelet agents. ASA                          Grade Assessment: II - A patient with mild systemic                          disease. After reviewing the risks and benefits, the                          patient was deemed in satisfactory condition to                          undergo the procedure. The anesthesia plan was to use                          monitored anesthesia care (MAC). This assessment was                          completed before the administration of sedation.                         After I obtained informed consent, the scope was                          passed under direct vision. Throughout the procedure,                          the patient's blood pressure, pulse, and oxygen                          saturations were monitored continuously. The                          Colonoscope was introduced through the anus and                          advanced to the cecum, identified by appendiceal                          orifice and ileocecal valve. The colonoscopy was  performed without difficulty. The patient tolerated                          the procedure well. The quality of the bowel                          preparation was evaluated using the BBPS [facility] Bowel                          Preparation Scale) with scores of: Right Colon = 3,                          Transverse Colon = 3 and Left Colon = 3 (entire mucosa                          seen well with no residual staining, small fragments                          of stool or opaque liquid). The total BBPS score                          equals 9. The ileocecal valve, appendiceal orifice,                          and rectum were photographed. Scope withdrawal time                          was 8 minutes.                                                                                    Findings:       The perianal and digital rectal examinations were normal. Pertinent        negatives include no palpable rectal lesions.       Two sessile polyps were found in the transverse colon. The polyps were 1        to 2 mm in size. These polyps were removed with a cold biopsy forceps.        Resection and retrieval were complete. Verification of patient        identification for the specimen was done. Estimated blood loss was        minimal.       The colon (entire examined portion) appeared normal. Biopsies for        histology were taken with a cold forceps from the ascending colon,        transverse colon, sigmoid colon and rectum for evaluation of microscopic        colitis. Verification of patient identification for the specimen was        done. Estimated blood loss was minimal.  Estimated Blood Loss:       Estimated blood loss: none. Estimated blood loss was minimal.  Impression:            - Two 1 to 2 mm polyps in the transverse colon,                          removed with a cold biopsy forceps. Resected and                          retrieved.                         - The entire examined colon is normal. Biopsied.  Recommendation:        - Discharge patient to home (ambulatory).                         - Resume previous diet.                         - Await pathology results.                         - Repeat colonoscopy for surveillance based on                          pathology results.                                                                                   Procedure Code(s):     --- Professional ---                         [phone], 33, Colonoscopy, flexible; with biopsy, single                          or multiple  Diagnosis Code(s):     --- Professional ---                         Z12.11, Encounter for screening for malignant neoplasm                          of colon                          D12.3, Benign neoplasm of transverse colon (hepatic                          flexure or splenic flexure)    CPT copyright 2022 [employer]. All rights reserved.    The codes documented in this report are preliminary and upon coder review may   be revised to meet current compliance requirements.    Dr Richarda Overlie  _____________  Jeannene Patella,   01/30/2023 11:01:08 AM  Scope Withdrawal Time 0 hours  8 minutes 2 seconds   Total Procedure Duration Time 0 hours 15 minutes 8 seconds   Scope In: 10:41:42 AM  Scope Out: 10:56:50 AM         [address] [place], [place], [address]

## 2023-01-30 NOTE — Discharge Instructions (Addendum)
[facility]  ENDOSCOPY INSTRUCTION/RELEASE FORMS    Date: 8/26/2024Dr. Donnald Garre  Procedure: Procedure(s):  COLONOSCOPY W/ BIOPSY    IF YOU DEVELOP EXCESSIVE BLEEDING, UNCONTROLLED PAIN OR SHORTNESS OF BREATH, GO TO THE EMERGENCY DEPARTMENT FOR AN EXAMINATION.  IF YOU DEVELOP CHILLS, FEVER (greater than 100.5) OR HAVE CONCERNS ABOUT YOUR RECOVERY, CALL Dr. Donnald Garre at [phone] .     BECAUSE OF THE SEDATION YOU RECEIVED FOR YOUR PROCEDURE, PLEASE FOLLOW THE INSTRUCTIONS BELOW:  You may experience lightheadedness and nausea.  Rest today; no strenuous activity.  DO NOT:  Drink alcoholic beverages today.  Make serious financial or legal decisions today.  Operate automobiles, heavy machinery or use any appliances today.  Consult your physician about taking over the counter medications such as Motrin, Advil, Aleve, Ibuprofen or Naprosyn if you've had polyps removed.  Diet:  Resume your diet as tolerated or recommended by your primary care provider.  Medications:  The treatment you received today does not change your current medications.  New/revised prescriptions given to patient?: No.  Side Effects You May Experience:  Cramping, gas, bloating, belching.  Blood tinged mucous from rectum.  Additional:  Follow up with Dr. Donnald Garre at [phone] for biopsy results.  Follow up for routine appointment/further testing as determined per your Dr.  Bonita Quin will be contacted soon with biopsy results    Future Appointments   Date Time Provider Department Center   02/09/2023 11:00 AM Kathrine Haddock, CNP [facility]   02/27/2023 11:30 AM Kathrine Haddock, CNP [facility]     All Patient Belongings Have Been Returned: _________(patient initials)  I understand and acknowledge receipt of the above instructions.                                                                                                                                          Patient or Guardian Signature                                                          Date/Time  Physician's or R.N.'s Signature                                                                  Date/Time      The discharge instructions have been reviewed with the patient and/or Guardian.  Patient and/or Guardian signed and retained a printed copy.

## 2023-01-30 NOTE — TOC Discharge Planning (AHS/AVS) (Signed)
Anesthesia Transfer of Care Note    Patient: [name]  Procedure(s) Performed: Procedure(s):  COLONOSCOPY W/ BIOPSY    Patient location: Endoscopy PACU    Anesthesia type: MAC    Airway Device on Arrival to PACU/ICU: Room Air    IV Access: Peripheral    Monitors Recommended to be Used During PACU/ICU: Quarry manager Issues to Address: None    Level of Consciousness: awake, alert , and oriented    Post vital signs:    Vitals:    01/30/23 1104   BP: 110/63   Pulse: 88   Resp: 22   Temp: 98.1 F (36.7 C)   SpO2: 100%       Complications:  No notable events documented.    Date 01/29/23 0700 - 01/30/23 0659(Not Admitted) 01/30/23 0700 - 01/31/23 0659   Shift 0700-1459 1500-2259 2300-0659 24 Hour Total 0700-1459 1500-2259 2300-0659 24 Hour Total   INTAKE   I.V.     500   500     Volume (mL) (sodium chloride 0.9 % IV infusion)     500   500   Shift Total     500   500   OUTPUT   Shift Total           Weight (kg)

## 2023-01-30 NOTE — Anesthesia Post-Procedure Evaluation (Signed)
Anesthesia Post Note    Patient: [name]    Procedure(s) Performed: Procedure(s):  COLONOSCOPY W/ BIOPSY    Anesthesia type: MAC    Patient location: Endoscopy PACU    Airway: Patent    Post pain: Adequate analgesia    Nausea / Vomiting: Absent    Post-operative Hydration Status: Adequate    Post assessment: no apparent anesthetic complications and tolerated procedure well    Last Vitals:   Vitals:    01/30/23 1107 01/30/23 1115 01/30/23 1130 01/30/23 1145   BP: 110/63 113/65 108/66 102/62   Pulse: 87 81 82 82   Resp: 21 18 19 20    Temp: 98 F (36.7 C)      TempSrc: Oral      SpO2: 100% 99% 100% 97%        Last Temperature: 98 F (36.7 C) (01/30/2023 11:07 AM)      Post vital signs: stable    Level of consciousness: awake and alert     Complications:  There were no known notable events for this encounter.

## 2023-01-30 NOTE — Other (Signed)
[facility]  PRE-SEDATION ASSESSMENT, HISTORY & PHYSICAL    Date: 01/30/2023     [name] is a 77 y.o. year old female MRN: [id number]    Pre-Procedure Diagnosis/Procedure Indication: screening  Planned Procedure: colonoscopy  NPO for solids >8 hours, NPO for liquids >8 hours    Past Medical History     Past Medical History:   Diagnosis Date    Acquired absence of ovary, unilateral     Missing left ovary.    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    Sudden hearing loss after trauma    ASNHL (asymmetrical sensorineural hearing loss)       Past Surgical History     Past Surgical History:   Procedure Laterality Date    ARTHROSCOPY SHOULDER Right 02/17/2022    Procedure: Right Shoulder Arthroscopic Balloon Arthroplasty;  Surgeon: Andree Elk, MD;  Location: [facility] OR [facility];  Service: Orthopedics;  Laterality: Right;    CESAREAN SECTION  1985    HARDWARE REMOVAL  8/09    of left tibia plateau    LEG SURGERY  05/09/06    fractured left tibia plateau    RELEASE CARPAL TUNNEL Left 03/22/2016    Procedure: LEFT CARPAL TUNNEL RELEASE;  Surgeon: Kathrene Bongo, MD;  Location: [facility] OR;  Service: Orthopedics;  Laterality: Left;    TOTAL KNEE ARTHROPLASTY Left 11/09/2021    Procedure:  LEFT TOTAL KNEE ARTHROPLASTY;  Surgeon: Skipper Cliche, MD;  Location: [facility] OR;  Service: Orthopedics;  Laterality: Left;    WRIST FRACTURE SURGERY Left 11/13/2015    Procedure: OPEN REDUCTION INTERNAL FIXATION LEFT DISTAL RADIUS FRACTURE;  Surgeon: Kathrene Bongo, MD;  Location: [facility] OR;  Service: Orthopedics;  Laterality: Left;       Medications     Prior to Admission medications    Medication Sig Start Date End Date Taking? Authorizing Provider   acetaminophen (TYLENOL) 500 MG tablet Take 2 tablets (1,000 mg total) by mouth every 8 hours. 02/17/22   Althea Ether Griffins, MD   alendronate (FOSAMAX) 35 MG tablet Take 1 tablet (35 mg total) by mouth every 7 days.    Historical Provider, MD   calcium carbonate (OS-CAL) 500 mg calcium (1,250 mg) chewable tablet Chew 1 tablet (1,250 mg total) by mouth daily.    Historical Provider, MD   fluticasone propionate (FLONASE) 50 mcg/actuation nasal spray shake liquid and use 2 sprays in each nostril daily 11/29/22   Lenard Simmer, PA   lactobacillus rhamnosus,  GG, (CULTURELLE) 10 billion cell capsule Take 1 capsule by mouth daily.    Historical Provider, MD   ofloxacin (FLOXIN) 0.3 % otic solution Place 5 drops into the right ear daily. 11/03/22   Lenard Simmer, PA   ofloxacin (FLOXIN) 0.3 % otic solution Place 5 drops into the right ear daily. 11/17/22   Lenard Simmer, PA   polyethylene glycol (MIRALAX) 17 gram/dose powder Take 17 g by mouth daily. Indications: constipation 11/03/22 04/02/23  Kathrine Haddock, CNP   predniSONE (DELTASONE) 20 MG tablet Take 3 tablets ( 60 mg) once daily for seven days. Then take 2 tablets ( 40 mg) once daily for three days. Then take 1 tablets ( 20 mg) once daily for two days. Then take 1/2 tablet ( 10 mg) once daily for two days. 11/03/22   Lenard Simmer, PA   psyllium (KONSYL) Powd Take 1 tbsp in 8 oz water daily 11/03/22   Kathrine Haddock, CNP   senna-docusate (SENNOSIDES-DOCUSATE SODIUM) 8.6-50 mg per tablet Take 1 tablet by mouth every 12 hours as  needed for Constipation. 02/17/22   Vita Erm, MD     Allergies:  Iodine  Iodine And Iodide Containing Products  Iodinated Contrast Media  Shellfish Containing Products    Abbreviated Review of Systems (ROS)     Functional Capacity: [employer] ACTIVITY SCALE: 5 - Walking four miles per hour; social dancing; washing a car.  Chest Pain: no  Shortness of Breath/Dyspnea or Exertion: no  Recent URI: no    Airway, ASA Score & Sedation Specific History Concerns   ASA 2    Focused Physical Exam:     Height ; Weight ; BMI There is no height or weight on file to calculate BMI.  Vitals:    01/30/23 0943   BP: 142/88   Pulse: 82   Resp: 16   Temp: 98 F (36.7 C)   SpO2: 100%       Neuro: no deficits  Cardiovascular: RRR  Respiratory: CTAB    Sedation Plan: MAC    Antibiotic prophylaxis is not indicated.

## 2023-02-09 ENCOUNTER — Ambulatory Visit: Admit: 2023-02-09 | Discharge: 2023-02-09 | Payer: MEDICARE

## 2023-02-09 DIAGNOSIS — R198 Other specified symptoms and signs involving the digestive system and abdomen: Secondary | ICD-10-CM

## 2023-02-09 NOTE — Patient Instructions (Signed)
Start taking Metamucil daily again    Start taking 1 capful of MiraLAX daily.  If your bowel pattern returns to normal you can decrease the MiraLAX back to half a capful daily

## 2023-02-09 NOTE — Progress Notes (Signed)
[facility]   Return Office Visit  Nurse Practitioner    CHIEF COMPLAINT:  s/p Colonoscopy, Alternating C/D, Bloating    HISTORY OF PRESENT ILLNESS:    [name] is a 77 y.o. female who is seen in follow up.    Patient was last seen on 12/13/2022 for Alternating C/D, Colon Cancer Screening. [name] presents s/p Colonoscopy.  Colonoscopy showed 2 small tubular adenomas that were removed.  She states she continues to alternate constipation and diarrhea.  She has been taking 1 tablespoon of Metamucil daily and half a capful of MiraLAX daily.  She has not taken any since her colonoscopy. She denies a recent change in bowel habits, hematochezia, melena, change in stool caliber. No unintentional weight loss, abdominal pain.     Denies any dysphagia, odynophagia, reflux, regurgitation, nausea. Denies any prior liver, pancreatic or gallbladder disease.      No history of anemia.  No sleep apnea. No narcotics. No history of anesthesia complications.    KUB: 11/04/2022  Moderate stool burden     Last Colonoscopy: 01/30/2023  Impression:    - Two 1 to 2 mm polyps in the transverse colon,                          removed with a cold biopsy forceps. Resected and                          retrieved.                          - The entire examined colon is normal. Biopsied.   Path:  A.   Colon, random, biopsy:   - Fragments of colonic mucosa with small lymphoid aggregates, lamina   propria    with pigmented macrophages and mild increase chronic inflammatory cells.   - No overt evidence of microscopic colitis.     B.   Colon, transverse, polyps x2:   - Fragment of tubular adenoma x1.   - Additional fragment of benign colonic mucosa with small lymphoid   aggregate x1.   - No evidence of high-grade dysplasia.     Colonoscopy: 01/01/2002  Normal. Dr. Deborah Chalk at [facility]. Next recommended 5 years    Family History of Gastrointestinal Malignancy: none       REVIEW OF SYSTEMS:   (For the following ROS, pertinent positives  are in bold; pertinent negatives are in italics.)  CONSTITUTIONAL: fevers, chills, sweats, weight loss, fatigue  CV: chest pain, dyspnea, palpitations  RESP: cough, dyspnea  GI: see HPI    Past Medical History:   Past Medical History:   Diagnosis Date    Acquired absence of ovary, unilateral     Missing left ovary.    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    History of fall     fell 10/2015    Low back pain     Osteoarthritis     Osteoporosis     Urticaria     Varicella        Past Surgical History:   Past Surgical History:   Procedure Laterality Date    ARTHROSCOPY SHOULDER Right 02/17/2022  Procedure: Right Shoulder Arthroscopic Balloon Arthroplasty;  Surgeon: Andree Elk, MD;  Location: [facility] OR [facility];  Service: Orthopedics;  Laterality: Right;    CESAREAN SECTION  1985    COLONOSCOPY N/A 01/30/2023    Procedure: COLONOSCOPY W/ BIOPSY;  Surgeon: Jeannene Patella, MD;  Location: [facility] ENDOSCOPY;  Service: Gastroenterology;  Laterality: N/A;    HARDWARE REMOVAL  8/09    of left tibia plateau    LEG SURGERY  05/09/06    fractured left tibia plateau    RELEASE CARPAL TUNNEL Left 03/22/2016    Procedure: LEFT CARPAL TUNNEL RELEASE;  Surgeon: Kathrene Bongo, MD;  Location: [facility] OR;  Service: Orthopedics;  Laterality: Left;    TOTAL KNEE ARTHROPLASTY Left 11/09/2021    Procedure: LEFT TOTAL KNEE ARTHROPLASTY;  Surgeon: Skipper Cliche, MD;  Location: [facility] OR;  Service: Orthopedics;  Laterality: Left;    WRIST FRACTURE SURGERY Left 11/13/2015    Procedure: OPEN REDUCTION INTERNAL FIXATION LEFT DISTAL RADIUS FRACTURE;  Surgeon: Kathrene Bongo, MD;  Location: [facility] OR;  Service: Orthopedics;  Laterality: Left;       Current Medications:    Meds Reviews and Updated     ALLERGIES:  Iodine, Iodine and iodide containing products, Iodinated contrast media, and Shellfish containing  products    SOCIAL HISTORY:    Social History     Socioeconomic History    Marital status: Widowed     Spouse name: Not on file    Number of children: Not on file    Years of education: Not on file    Highest education level: Not on file   Occupational History    Not on file   Tobacco Use    Smoking status: Never    Smokeless tobacco: Never   Vaping Use    Vaping status: Never Used   Substance and Sexual Activity    Alcohol use: Yes     Alcohol/week: 4.0 standard drinks of alcohol     Types: 4 Glasses of wine per week     Comment: ocasionally    Drug use: No     Comment: 12-23-2009    Sexual activity: Not Currently     Partners: Male   Other Topics Concern    Caffeine Use Yes    Occupational Exposure No    Exercise Yes    Seat Belt Yes   Social History Narrative    Not on file     Social Determinants of Health     Financial Resource Strain: Not on file   Food Insecurity: No Food Insecurity (11/01/2022)    Yearly Questionnaire     Do you need any assistance with obtaining housing, meals, medication, transportation or medical equipment?: No     Assistance needed for:: Not on file   Transportation Needs: No Transportation Needs (11/01/2022)    Yearly Questionnaire     Do you need any assistance with obtaining housing, meals, medication, transportation or medical equipment?: No     Assistance needed for:: Not on file   Physical Activity: Not on file   Stress: Not on file   Social Connections: Not on file   Intimate Partner Violence: Not At Risk (01/30/2023)    Humiliation, Afraid, Rape, and Kick questionnaire     Fear of Current or Ex-Partner: No     Emotionally Abused: No     Physically Abused: No     Sexually Abused: No   Housing Stability: Low Risk  (11/01/2022)    Yearly  Questionnaire     Do you need any assistance with obtaining housing, meals, medication, transportation or medical equipment?: No     Assistance needed for:: Not on file        FAMILY HISTORY:   Family History   Problem Relation Age of Onset    Stroke  Mother     Melanoma Neg Hx        PHYSICAL EXAM:   Vital Signs:  There were no vitals taken for this visit.  There is no height or weight on file to calculate BMI.    CONSTITUTIONAL: WD/WN, in NAD  LUNGS: no increased work of breathing   CARDIOVASCULAR: RRR  ABDOMEN: no scars, normal bowel sounds, soft, non-distended, non-tender, no rebound or guarding, no hepatomegaly   MUSCULOSKELETAL: there is no redness, warmth, or swelling of the joints  SKIN: no redness, warmth, or swelling, no rashes and no jaundice    Lab Results   Component Value Date    WBC 5.7 11/01/2022    HGB 15.1 11/01/2022    HCT 43.1 11/01/2022    MCV 97.0 11/01/2022    PLT 217 11/01/2022     Lab Results   Component Value Date    GLUCOSE 96 11/01/2022    BUN 15 11/01/2022    CREATININE 0.74 11/01/2022    K 4.7 11/01/2022    PHOS 4.3 04/11/2022    ALBUMIN 4.5 11/01/2022    EGFR 83 11/01/2022     Lab Results   Component Value Date    ALKPHOS 83 11/01/2022    ALT 9 11/01/2022    AST 24 12/30/2009    AST 24 12/30/2009    BILITOT 0.4 11/01/2022    ALBUMIN 4.5 11/01/2022    PROT 6.9 11/01/2022    LABGLOB 2.5 12/30/2009    LABGLOB 2.5 12/30/2009     No results found for: IRON, TIBC, FERRITIN    DIAGNOSTIC STUDIES REVIEWED:    As Noted in the HPI     ASSESSMENT AND PLAN:  [name] is a 77 y.o. female who is seen in follow up.  [name] presents s/p colonoscopy.  We reviewed the results.  This should be her last screening colonoscopy due to age.  As far as her alternating constipation and diarrhea I encouraged her to continue taking the Metamucil daily.  I would also like her to increase her MiraLAX to 1 full capful daily.  She can titrate the MiraLAX accordingly once her bowel habits returned to normal.  I encouraged her to contact me if she has any questions or change in symptoms.  Patient was agreeable with this plan.    Return to Clinic: 6 months    This document was dictated using Conservation officer, historic buildings.  While every effort is  made to proofread the document as it is being created, please excuse any transcription errors encountered in reading.    Kathrine Haddock, APRN  [facility]   [phone]

## 2023-02-27 ENCOUNTER — Ambulatory Visit: Payer: PRIVATE HEALTH INSURANCE

## 2023-03-05 ENCOUNTER — Emergency Department: Payer: Medicare (Managed Care)

## 2023-03-05 ENCOUNTER — Other Ambulatory Visit: Payer: Self-pay

## 2023-03-05 ENCOUNTER — Emergency Department
Admission: EM | Admit: 2023-03-05 | Discharge: 2023-03-05 | Disposition: A | Discharge status: Home / Self Care | Payer: Medicare (Managed Care) | Attending: Emergency Medicine | Admitting: Emergency Medicine

## 2023-03-05 DIAGNOSIS — W19XXXA Unspecified fall, initial encounter: Secondary | ICD-10-CM | POA: Diagnosis not present

## 2023-03-05 DIAGNOSIS — M79671 Pain in right foot: Secondary | ICD-10-CM | POA: Insufficient documentation

## 2023-03-05 DIAGNOSIS — E860 Dehydration: Secondary | ICD-10-CM | POA: Diagnosis not present

## 2023-03-05 DIAGNOSIS — Z7901 Long term (current) use of anticoagulants: Secondary | ICD-10-CM | POA: Diagnosis not present

## 2023-03-05 DIAGNOSIS — R55 Syncope and collapse: Secondary | ICD-10-CM | POA: Diagnosis not present

## 2023-03-05 DIAGNOSIS — S80212A Abrasion, left knee, initial encounter: Secondary | ICD-10-CM | POA: Diagnosis not present

## 2023-03-05 DIAGNOSIS — S50312A Abrasion of left elbow, initial encounter: Secondary | ICD-10-CM | POA: Diagnosis not present

## 2023-03-05 DIAGNOSIS — I6782 Cerebral ischemia: Secondary | ICD-10-CM | POA: Diagnosis not present

## 2023-03-05 DIAGNOSIS — J90 Pleural effusion, not elsewhere classified: Secondary | ICD-10-CM | POA: Insufficient documentation

## 2023-03-05 DIAGNOSIS — M79622 Pain in left upper arm: Secondary | ICD-10-CM | POA: Insufficient documentation

## 2023-03-05 DIAGNOSIS — Y92195 Garage of other specified residential institution as the place of occurrence of the external cause: Secondary | ICD-10-CM | POA: Insufficient documentation

## 2023-03-05 DIAGNOSIS — M47812 Spondylosis without myelopathy or radiculopathy, cervical region: Secondary | ICD-10-CM | POA: Diagnosis not present

## 2023-03-05 DIAGNOSIS — R9431 Abnormal electrocardiogram [ECG] [EKG]: Secondary | ICD-10-CM | POA: Insufficient documentation

## 2023-03-05 DIAGNOSIS — G319 Degenerative disease of nervous system, unspecified: Secondary | ICD-10-CM | POA: Insufficient documentation

## 2023-03-05 LAB — CBC
HCT: 41.5 % (ref 36.0–46.0)
Hemoglobin: 13.6 g/dL (ref 12.0–15.0)
MCH: 30.5 pg (ref 26.0–34.0)
MCHC: 32.8 g/dL (ref 30.0–36.0)
MCV: 93 fL (ref 80.0–100.0)
Platelets: 179 10*3/uL (ref 150–400)
RBC: 4.46 MIL/uL (ref 3.87–5.11)
RDW: 13.5 % (ref 11.5–15.5)
WBC: 7.6 10*3/uL (ref 4.0–10.5)
nRBC: 0 % (ref 0.0–0.2)

## 2023-03-05 LAB — BASIC METABOLIC PANEL
Anion gap: 11 (ref 5–15)
BUN: 25 mg/dL — ABNORMAL HIGH (ref 8–23)
CO2: 26 mmol/L (ref 22–32)
Calcium: 9.2 mg/dL (ref 8.9–10.3)
Chloride: 99 mmol/L (ref 98–111)
Creatinine, Ser: 1.13 mg/dL — ABNORMAL HIGH (ref 0.44–1.00)
GFR, Estimated: 50 mL/min — ABNORMAL LOW (ref >60)
Glucose, Bld: 135 mg/dL — ABNORMAL HIGH (ref 70–99)
Potassium: 3.9 mmol/L (ref 3.5–5.1)
Sodium: 136 mmol/L (ref 135–145)

## 2023-03-05 NOTE — ED Provider Notes (Signed)
[facility] Provider Note    Event Date/Time   First MD Initiated Contact with Patient 03/05/23 1628     (approximate)   History   Fall   HPI  [name] is a 77 y.o. female who presents to the emergency department today after a fall.  The patient states she was in the garage when she fell.  She does have a history of dizzy episodes and has fallen in the past.  She states she has had extensive workup for this without any clear etiology.  Today she did feel dizzy when she was falling but did not have anything to grab onto.  She is primarily complaining of pain in the left upper arm.  She has had multiple surgeries in the past secondary to fractures and complications.  Also complaining of pain to the top of her right foot.     Physical Exam   Triage Vital Signs: ED Triage Vitals  Encounter Vitals Group     BP 03/05/23 1625 121/76     Systolic BP Percentile --      Diastolic BP Percentile --      Pulse Rate 03/05/23 1625 (!) 58     Resp 03/05/23 1625 16     Temp 03/05/23 1632 98.4 F (36.9 C)     Temp Source 03/05/23 1632 Oral     SpO2 03/05/23 1625 94 %     Weight 03/05/23 1630 141 lb (64 kg)     Height 03/05/23 1629 5\' 1"  (1.549 m)     Head Circumference --      Peak Flow --      Pain Score 03/05/23 1629 9     Pain Loc --      Pain Education --      Exclude from Growth Chart --     Most recent vital signs: Vitals:   03/05/23 1625 03/05/23 1632  BP: 121/76   Pulse: (!) 58   Resp: 16   Temp:  98.4 F (36.9 C)  SpO2: 94%    General: Awake, alert, oriented. CV:  Good peripheral perfusion. Regular rate and rhythm. Resp:  Normal effort. Lungs clear. Abd:  No distention.  Other:  Abrasion to left knee and left elbow.   ED Results / Procedures / Treatments   Labs (all labs ordered are listed, but only abnormal results are displayed) Labs Reviewed  BASIC METABOLIC PANEL - Abnormal; Notable for the following components:      Result  Value   Glucose, Bld 135 (*)    BUN 25 (*)    Creatinine, Ser 1.13 (*)    GFR, Estimated 50 (*)    All other components within normal limits  CBC  CBG MONITORING, ED     EKG  I, Phineas Semen, attending physician, personally viewed and interpreted this EKG  EKG Time: 1638 Rate: 59 Rhythm: sinus bradycardia Axis: normal Intervals: qtc 499 QRS: narrow ST changes: no st elevation Impression: abnormal ekg    RADIOLOGY I independently interpreted and visualized the CT head/cervical spine. My interpretation: No intracranial bleed. No fracture Radiology interpretation:  IMPRESSION:  CT head:    1.  No evidence of an acute intracranial abnormality.  2. 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    CT cervical spine:    1. No evidence of an acute cervical spine fracture.  2. Levocurvature of the cervical spine.  3. Nonspecific reversal of the expected cervical  lordosis.  4. Cervical spondylosis as described. At C6-C7, a posterior disc  osteophyte complex contributes to spinal canal stenosis which  appears at least moderate in severity.  5. Partially imaged right pleural effusion.      PROCEDURES:  Critical Care performed: No   MEDICATIONS ORDERED IN ED: Medications - No data to display   IMPRESSION / MDM / ASSESSMENT AND PLAN / ED COURSE  I reviewed the triage vital signs and the nursing notes.                              Differential diagnosis includes, but is not limited to, fracture, dislocation, contusion.  Patient's presentation is most consistent with acute presentation with potential threat to life or bodily function.   Patient presented to the emergency department today after a fall.  Imaging negative for acute fracture.  Patient did have an abrasion over her left knee although not tenderness.  She states she did not have concern for fracture to that knee.  Did check blood work which showed a slight  elevation of her creatinine.  I did discuss this with the patient.  She declined any IV fluids and states she will focus on hydrating.  At this time given negative imaging and otherwise reassuring blood work I think is reasonable for patient be discharged home.     FINAL CLINICAL IMPRESSION(S) / ED DIAGNOSES   Final diagnoses:  Fall, initial encounter  Dehydration     Note:  This document was prepared using Dragon voice recognition software and may include unintentional dictation errors.    Phineas Semen, MD 03/05/23 [phone]

## 2023-03-05 NOTE — Discharge Instructions (Signed)
Please seek medical attention for any high fevers, chest pain, shortness of breath, change in behavior, persistent vomiting, bloody stool or any other new or concerning symptoms.  

## 2023-03-05 NOTE — ED Triage Notes (Signed)
Pt from home via [facility] for fall when taking the trash out. Pt did not lose consciousness, does not think she hit her head but is not "100% sure." Takes Eliquis. Pain in left elbow and knee and top of right foot. Abrasion to left knee.  EMS vitals: BP 126/60 HR 59 CBG 134 RR 14 95% RA

## 2023-04-07 ENCOUNTER — Other Ambulatory Visit: Payer: Self-pay

## 2023-04-07 ENCOUNTER — Inpatient Hospital Stay
Admission: EM | Admit: 2023-04-07 | Discharge: 2023-04-12 | DRG: 841 | Disposition: A | Discharge status: Home Health | Payer: Medicare (Managed Care) | Source: Ambulatory Visit | Attending: Internal Medicine | Admitting: Internal Medicine

## 2023-04-07 ENCOUNTER — Other Ambulatory Visit: Payer: Self-pay | Admitting: Family Medicine

## 2023-04-07 ENCOUNTER — Encounter: Payer: Self-pay | Admitting: Internal Medicine

## 2023-04-07 ENCOUNTER — Ambulatory Visit
Admission: RE | Admit: 2023-04-07 | Discharge: 2023-04-07 | Disposition: A | Discharge status: Home / Self Care | Payer: Medicare (Managed Care) | Source: Ambulatory Visit | Attending: Family Medicine | Admitting: Family Medicine

## 2023-04-07 DIAGNOSIS — I2510 Atherosclerotic heart disease of native coronary artery without angina pectoris: Secondary | ICD-10-CM | POA: Diagnosis present

## 2023-04-07 DIAGNOSIS — Z86718 Personal history of other venous thrombosis and embolism: Secondary | ICD-10-CM

## 2023-04-07 DIAGNOSIS — I1310 Hypertensive heart and chronic kidney disease without heart failure, with stage 1 through stage 4 chronic kidney disease, or unspecified chronic kidney disease: Secondary | ICD-10-CM | POA: Diagnosis present

## 2023-04-07 DIAGNOSIS — I82409 Acute embolism and thrombosis of unspecified deep veins of unspecified lower extremity: Secondary | ICD-10-CM | POA: Diagnosis present

## 2023-04-07 DIAGNOSIS — J90 Pleural effusion, not elsewhere classified: Secondary | ICD-10-CM | POA: Diagnosis present

## 2023-04-07 DIAGNOSIS — J4489 Other specified chronic obstructive pulmonary disease: Secondary | ICD-10-CM | POA: Diagnosis present

## 2023-04-07 DIAGNOSIS — F419 Anxiety disorder, unspecified: Secondary | ICD-10-CM | POA: Diagnosis present

## 2023-04-07 DIAGNOSIS — R0602 Shortness of breath: Secondary | ICD-10-CM | POA: Insufficient documentation

## 2023-04-07 DIAGNOSIS — J449 Chronic obstructive pulmonary disease, unspecified: Secondary | ICD-10-CM

## 2023-04-07 DIAGNOSIS — Z96612 Presence of left artificial shoulder joint: Secondary | ICD-10-CM | POA: Diagnosis present

## 2023-04-07 DIAGNOSIS — K5900 Constipation, unspecified: Secondary | ICD-10-CM | POA: Diagnosis not present

## 2023-04-07 DIAGNOSIS — Z7722 Contact with and (suspected) exposure to environmental tobacco smoke (acute) (chronic): Secondary | ICD-10-CM | POA: Diagnosis present

## 2023-04-07 DIAGNOSIS — R0789 Other chest pain: Secondary | ICD-10-CM

## 2023-04-07 DIAGNOSIS — K219 Gastro-esophageal reflux disease without esophagitis: Secondary | ICD-10-CM | POA: Diagnosis present

## 2023-04-07 DIAGNOSIS — F32A Depression, unspecified: Secondary | ICD-10-CM | POA: Diagnosis present

## 2023-04-07 DIAGNOSIS — R0609 Other forms of dyspnea: Secondary | ICD-10-CM | POA: Diagnosis not present

## 2023-04-07 DIAGNOSIS — E039 Hypothyroidism, unspecified: Secondary | ICD-10-CM | POA: Diagnosis not present

## 2023-04-07 DIAGNOSIS — Z885 Allergy status to narcotic agent status: Secondary | ICD-10-CM

## 2023-04-07 DIAGNOSIS — Z96651 Presence of right artificial knee joint: Secondary | ICD-10-CM | POA: Diagnosis present

## 2023-04-07 DIAGNOSIS — E1122 Type 2 diabetes mellitus with diabetic chronic kidney disease: Secondary | ICD-10-CM | POA: Diagnosis present

## 2023-04-07 DIAGNOSIS — R296 Repeated falls: Secondary | ICD-10-CM | POA: Diagnosis present

## 2023-04-07 DIAGNOSIS — Z881 Allergy status to other antibiotic agents status: Secondary | ICD-10-CM

## 2023-04-07 DIAGNOSIS — I10 Essential (primary) hypertension: Secondary | ICD-10-CM | POA: Diagnosis present

## 2023-04-07 DIAGNOSIS — Z91041 Radiographic dye allergy status: Secondary | ICD-10-CM

## 2023-04-07 DIAGNOSIS — Z841 Family history of disorders of kidney and ureter: Secondary | ICD-10-CM

## 2023-04-07 DIAGNOSIS — Z79899 Other long term (current) drug therapy: Secondary | ICD-10-CM

## 2023-04-07 DIAGNOSIS — E1129 Type 2 diabetes mellitus with other diabetic kidney complication: Secondary | ICD-10-CM | POA: Diagnosis present

## 2023-04-07 DIAGNOSIS — M79671 Pain in right foot: Secondary | ICD-10-CM | POA: Diagnosis present

## 2023-04-07 DIAGNOSIS — N183 Chronic kidney disease, stage 3 unspecified: Secondary | ICD-10-CM | POA: Diagnosis not present

## 2023-04-07 DIAGNOSIS — I129 Hypertensive chronic kidney disease with stage 1 through stage 4 chronic kidney disease, or unspecified chronic kidney disease: Secondary | ICD-10-CM | POA: Diagnosis present

## 2023-04-07 DIAGNOSIS — D72829 Elevated white blood cell count, unspecified: Secondary | ICD-10-CM | POA: Insufficient documentation

## 2023-04-07 DIAGNOSIS — J439 Emphysema, unspecified: Secondary | ICD-10-CM

## 2023-04-07 DIAGNOSIS — D631 Anemia in chronic kidney disease: Secondary | ICD-10-CM | POA: Diagnosis present

## 2023-04-07 DIAGNOSIS — R933 Abnormal findings on diagnostic imaging of other parts of digestive tract: Secondary | ICD-10-CM | POA: Diagnosis present

## 2023-04-07 DIAGNOSIS — J9811 Atelectasis: Secondary | ICD-10-CM | POA: Diagnosis present

## 2023-04-07 DIAGNOSIS — Z79890 Hormone replacement therapy (postmenopausal): Secondary | ICD-10-CM

## 2023-04-07 DIAGNOSIS — Z9181 History of falling: Secondary | ICD-10-CM

## 2023-04-07 DIAGNOSIS — E890 Postprocedural hypothyroidism: Secondary | ICD-10-CM | POA: Diagnosis present

## 2023-04-07 DIAGNOSIS — Z9049 Acquired absence of other specified parts of digestive tract: Secondary | ICD-10-CM

## 2023-04-07 DIAGNOSIS — N1831 Chronic kidney disease, stage 3a: Secondary | ICD-10-CM | POA: Diagnosis present

## 2023-04-07 DIAGNOSIS — C8510 Unspecified B-cell lymphoma, unspecified site: Principal | ICD-10-CM | POA: Diagnosis present

## 2023-04-07 DIAGNOSIS — E669 Obesity, unspecified: Secondary | ICD-10-CM | POA: Diagnosis present

## 2023-04-07 DIAGNOSIS — Z7901 Long term (current) use of anticoagulants: Secondary | ICD-10-CM

## 2023-04-07 DIAGNOSIS — I482 Chronic atrial fibrillation, unspecified: Secondary | ICD-10-CM | POA: Diagnosis present

## 2023-04-07 DIAGNOSIS — I959 Hypotension, unspecified: Secondary | ICD-10-CM | POA: Diagnosis not present

## 2023-04-07 LAB — BASIC METABOLIC PANEL
Anion gap: 11 (ref 5–15)
BUN: 33 mg/dL — ABNORMAL HIGH (ref 8–23)
CO2: 23 mmol/L (ref 22–32)
Calcium: 9.1 mg/dL (ref 8.9–10.3)
Chloride: 102 mmol/L (ref 98–111)
Creatinine, Ser: 1.02 mg/dL — ABNORMAL HIGH (ref 0.44–1.00)
GFR, Estimated: 56 mL/min — ABNORMAL LOW (ref >60)
Glucose, Bld: 111 mg/dL — ABNORMAL HIGH (ref 70–99)
Potassium: 4.2 mmol/L (ref 3.5–5.1)
Sodium: 136 mmol/L (ref 135–145)

## 2023-04-07 LAB — CBC WITH DIFFERENTIAL/PLATELET
Abs Immature Granulocytes: 0.07 10*3/uL (ref 0.00–0.07)
Basophils Absolute: 0 10*3/uL (ref 0.0–0.1)
Basophils Relative: 0 %
Eosinophils Absolute: 0 10*3/uL (ref 0.0–0.5)
Eosinophils Relative: 0 %
HCT: 40.2 % (ref 36.0–46.0)
Hemoglobin: 13.7 g/dL (ref 12.0–15.0)
Immature Granulocytes: 1 %
Lymphocytes Relative: 15 %
Lymphs Abs: 2.2 10*3/uL (ref 0.7–4.0)
MCH: 30.9 pg (ref 26.0–34.0)
MCHC: 34.1 g/dL (ref 30.0–36.0)
MCV: 90.5 fL (ref 80.0–100.0)
Monocytes Absolute: 1.3 10*3/uL — ABNORMAL HIGH (ref 0.1–1.0)
Monocytes Relative: 9 %
Neutro Abs: 10.7 10*3/uL — ABNORMAL HIGH (ref 1.7–7.7)
Neutrophils Relative %: 75 %
Platelets: 240 10*3/uL (ref 150–400)
RBC: 4.44 MIL/uL (ref 3.87–5.11)
RDW: 13.5 % (ref 11.5–15.5)
WBC: 14.3 10*3/uL — ABNORMAL HIGH (ref 4.0–10.5)
nRBC: 0 % (ref 0.0–0.2)

## 2023-04-07 LAB — APTT: aPTT: 42 s — ABNORMAL HIGH (ref 24–36)

## 2023-04-07 LAB — PROTIME-INR
INR: 1.7 — ABNORMAL HIGH (ref 0.8–1.2)
Prothrombin Time: 19.8 s — ABNORMAL HIGH (ref 11.4–15.2)

## 2023-04-07 LAB — PROCALCITONIN: Procalcitonin: <0.1 ng/mL

## 2023-04-07 MED ORDER — AMIODARONE HCL 200 MG PO TABS
200.0000 mg | ORAL_TABLET | Freq: Every day | ORAL | Status: DC
  Administered 2023-04-08 – 2023-04-09 (×2): 200 mg via ORAL
  Filled 2023-04-07 (×2): qty 1

## 2023-04-07 MED ORDER — HYDRALAZINE HCL 20 MG/ML IJ SOLN: 5.0000 mg | INTRAMUSCULAR | Status: DC | PRN

## 2023-04-07 MED ORDER — FERROUS SULFATE 325 (65 FE) MG PO TABS
325.0000 mg | ORAL_TABLET | Freq: Every day | ORAL | Status: DC
  Administered 2023-04-08 – 2023-04-12 (×5): 325 mg via ORAL
  Filled 2023-04-07 (×5): qty 1

## 2023-04-07 MED ORDER — VANCOMYCIN HCL 1250 MG/250ML IV SOLN
1250.0000 mg | Freq: Once | INTRAVENOUS | Status: AC
  Administered 2023-04-07: 1250 mg via INTRAVENOUS
  Filled 2023-04-07: qty 250

## 2023-04-07 MED ORDER — BUSPIRONE HCL 5 MG PO TABS: 5.0000 mg | ORAL_TABLET | Freq: Three times a day (TID) | ORAL | Status: DC | PRN

## 2023-04-07 MED ORDER — SODIUM CHLORIDE 0.9 % IV SOLN
2.0000 g | Freq: Two times a day (BID) | INTRAVENOUS | Status: DC
  Administered 2023-04-07 – 2023-04-09 (×4): 2 g via INTRAVENOUS
  Filled 2023-04-07 (×4): qty 12.5

## 2023-04-07 MED ORDER — ONDANSETRON HCL 4 MG/2ML IJ SOLN
4.0000 mg | Freq: Three times a day (TID) | INTRAMUSCULAR | Status: DC | PRN
  Administered 2023-04-08 – 2023-04-11 (×3): 4 mg via INTRAVENOUS
  Filled 2023-04-07 (×3): qty 2

## 2023-04-07 MED ORDER — VANCOMYCIN HCL 750 MG/150ML IV SOLN
750.0000 mg | INTRAVENOUS | Status: DC
  Administered 2023-04-08: 750 mg via INTRAVENOUS
  Filled 2023-04-07: qty 150

## 2023-04-07 MED ORDER — ATORVASTATIN CALCIUM 20 MG PO TABS: 20.0000 mg | ORAL_TABLET | Freq: Every day | ORAL | Status: DC

## 2023-04-07 MED ORDER — LEVOTHYROXINE SODIUM 125 MCG PO TABS
125.0000 ug | ORAL_TABLET | Freq: Every day | ORAL | Status: DC
  Administered 2023-04-08 – 2023-04-12 (×5): 125 ug via ORAL
  Filled 2023-04-07 (×5): qty 1

## 2023-04-07 MED ORDER — ENALAPRIL MALEATE 20 MG PO TABS
20.0000 mg | ORAL_TABLET | Freq: Every day | ORAL | Status: DC
Start: 2023-04-08 — End: 2023-04-10
  Administered 2023-04-08 – 2023-04-09 (×2): 20 mg via ORAL
  Filled 2023-04-07 (×2): qty 1
  Filled 2023-04-07 (×3): qty 2

## 2023-04-07 MED ORDER — POLYVINYL ALCOHOL 1.4 % OP SOLN: 2.0000 [drp] | OPHTHALMIC | Status: DC | PRN

## 2023-04-07 MED ORDER — SALINE SPRAY 0.65 % NA SOLN: 1.0000 | NASAL | Status: DC | PRN

## 2023-04-07 MED ORDER — ACETAMINOPHEN 325 MG PO TABS
650.0000 mg | ORAL_TABLET | Freq: Four times a day (QID) | ORAL | Status: DC | PRN
  Administered 2023-04-09: 650 mg via ORAL
  Filled 2023-04-07: qty 2

## 2023-04-07 MED ORDER — PANTOPRAZOLE SODIUM 40 MG PO TBEC
40.0000 mg | DELAYED_RELEASE_TABLET | Freq: Every day | ORAL | Status: DC
  Administered 2023-04-08 – 2023-04-12 (×5): 40 mg via ORAL
  Filled 2023-04-07 (×5): qty 1

## 2023-04-07 MED ORDER — METOPROLOL TARTRATE 25 MG PO TABS
25.0000 mg | ORAL_TABLET | Freq: Two times a day (BID) | ORAL | Status: DC
Start: 2023-04-08 — End: 2023-04-10
  Administered 2023-04-08 – 2023-04-09 (×3): 25 mg via ORAL
  Filled 2023-04-07 (×4): qty 1

## 2023-04-07 MED ORDER — DM-GUAIFENESIN ER 30-600 MG PO TB12
1.0000 | ORAL_TABLET | Freq: Two times a day (BID) | ORAL | Status: DC | PRN
  Administered 2023-04-11: 1 via ORAL
  Filled 2023-04-07: qty 1

## 2023-04-07 MED ORDER — ALBUTEROL SULFATE (2.5 MG/3ML) 0.083% IN NEBU
2.5000 mg | INHALATION_SOLUTION | RESPIRATORY_TRACT | Status: DC | PRN
  Administered 2023-04-08: 2.5 mg via RESPIRATORY_TRACT
  Filled 2023-04-07: qty 3

## 2023-04-07 MED ORDER — APIXABAN 5 MG PO TABS: 5.0000 mg | ORAL_TABLET | Freq: Two times a day (BID) | ORAL | Status: DC

## 2023-04-07 NOTE — ED Notes (Addendum)
PT ask for a spoon to eat her applesauce with and another blanket, provided. Pt ask for her night time medicine. RN notified.

## 2023-04-07 NOTE — Progress Notes (Signed)
Pharmacy Antibiotic Note  [name] is a 77 y.o. female admitted on 04/07/2023 with  possible respiratory infection .  Pharmacy has been consulted for Cefepime and Vancomycin dosing.  Plan: Vancomycin 1250 mg IV loading dose followed by Vancomycin 750 mg IV Q 24 hrs. Goal AUC 400-550. Expected AUC: 492.1 SCr used: 1.02 Expected Cmin: 13.9  Cefepime 2g IV q12h   Height: 5\' 1"  (154.9 cm) Weight: 64.4 kg (142 lb) IBW/kg (Calculated) : 47.8  Temp (24hrs), Avg:97.9 F (36.6 C), Min:97.7 F (36.5 C), Max:98.1 F (36.7 C)  Recent Labs  Lab 04/07/23 1806  WBC 14.3*  CREATININE 1.02*    Estimated Creatinine Clearance: 39 mL/min (A) (by C-G formula based on SCr of 1.02 mg/dL (H)).    Allergies  Allergen Reactions   Cephalosporins Itching   Codeine Other (See Comments)    Extreme drowsiness   Contrast Media [Iodinated Contrast Media] Hives and Swelling    Facial swelling   Oxycodone Other (See Comments)    Extreme drowsiness    Antimicrobials this admission: Cefepime 11/1 >>  Vancomycin 11/1 >>   Dose adjustments this admission:  Microbiology results:  Thank you for allowing pharmacy to be a part of this patient's care.  Clovia Cuff, PharmD, BCPS 04/07/2023 9:38 PM

## 2023-04-07 NOTE — ED Triage Notes (Addendum)
Pt at [facility] appt today and CT scan showed possible CHF verses pleural effusion with RUL atelectasis. Pt normally wears 2L [place] but was on 3L [place] for EMS with SOB.  3LNC - 96%O22 BP:123/78 HR: 60 CBG: 151

## 2023-04-07 NOTE — ED Notes (Signed)
Unable to get IV access do to pt not allowing [place] to stick left arm. Multiple attempts were made by two Rns to get IV access on right arm but were unsuccessful.

## 2023-04-07 NOTE — ED Notes (Signed)
Pt up to the toilet to void. Urine sample sent to lab. Pt back in bed with call bell and no other needs at this time.

## 2023-04-07 NOTE — ED Provider Notes (Signed)
[facility] Provider Note    Event Date/Time   First MD Initiated Contact with Patient 04/07/23 1701     (approximate)   History   Shortness of Breath   HPI  [name] is a 77 y.o. female who presents to the emergency department today because of concerns for abnormal CT scan performed as an outpatient.  The patient had the CT scan performed because of shortness of breath.     Physical Exam   Triage Vital Signs: ED Triage Vitals  Encounter Vitals Group     BP 04/07/23 1702 134/65     Systolic BP Percentile --      Diastolic BP Percentile --      Pulse Rate 04/07/23 1702 61     Resp 04/07/23 1702 20     Temp 04/07/23 1702 98.1 F (36.7 C)     Temp src --      SpO2 04/07/23 1702 93 %     Weight 04/07/23 1705 142 lb (64.4 kg)     Height 04/07/23 1705 5\' 1"  (1.549 m)     Head Circumference --      Peak Flow --      Pain Score 04/07/23 1701 4     Pain Loc --      Pain Education --      Exclude from Growth Chart --     Most recent vital signs: Vitals:   04/07/23 1702  BP: 134/65  Pulse: 61  Resp: 20  Temp: 98.1 F (36.7 C)  SpO2: 93%   General: Awake, alert, oriented. CV:  Good peripheral perfusion. Regular rate and rhythm. Resp:  Normal effort. Diminished breath sounds in right lung Abd:  No distention.    ED Results / Procedures / Treatments   Labs (all labs ordered are listed, but only abnormal results are displayed) Labs Reviewed  CBC WITH DIFFERENTIAL/PLATELET - Abnormal; Notable for the following components:      Result Value   WBC 14.3 (*)    Neutro Abs 10.7 (*)    Monocytes Absolute 1.3 (*)    All other components within normal limits  BASIC METABOLIC PANEL - Abnormal; Notable for the following components:   Glucose, Bld 111 (*)    BUN 33 (*)    Creatinine, Ser 1.02 (*)    GFR, Estimated 56 (*)    All other components within normal limits  CULTURE, BLOOD (ROUTINE X 2)  CULTURE, BLOOD (ROUTINE X 2)   PROCALCITONIN  CBC  BASIC METABOLIC PANEL  PROTIME-INR  APTT     EKG  I, Phineas Semen, attending physician, personally viewed and interpreted this EKG  EKG Time: 1705 Rate: 63 Rhythm: sinus bradycardia Axis: right axis deviation Intervals: qtc 496 QRS: narrow, q waves v1 ST changes: no st elevation Impression: abnormal ekg    RADIOLOGY CT from earlier today reviewed   PROCEDURES:  Critical Care performed: No    MEDICATIONS ORDERED IN ED: Medications - No data to display   IMPRESSION / MDM / ASSESSMENT AND PLAN / ED COURSE  I reviewed the triage vital signs and the nursing notes.                              Differential diagnosis includes, but is not limited to, malignant effusion, CHF, infectious etiology  Patient's presentation is most consistent with acute presentation with potential threat to life or bodily function.  The patient is on the cardiac monitor to evaluate for evidence of arrhythmia and/or significant heart rate changes.  Patient presented to the emergency department today after outpatient CT shows a large right-sided pleural effusion.  Discussed with Dr. Clyde Lundborg with the hospitalist service who will evaluate for admission.     FINAL CLINICAL IMPRESSION(S) / ED DIAGNOSES   Final diagnoses:  SOB (shortness of breath)  Pleural effusion     Note:  This document was prepared using Dragon voice recognition software and may include unintentional dictation errors.    Phineas Semen, MD 04/07/23 2255

## 2023-04-07 NOTE — H&P (Signed)
History and Physical    [name] WGN:[id number] DOB: 09/27/1945 DOA: 04/07/2023  Referring MD/NP/PA:   PCP: Pcp, No   Patient coming from:  The patient is coming from home.     Chief Complaint: SOB  HPI: [name] is a 77 y.o. female with medical history significant of a fib and DVT on Eliquis, HTN, diet-controlled diabetes, COPD on 2 L oxygen at night, hypothyroidism, GERD, depression, anemia, CKD-3A, who presents with shortness of shortness of breath.  Patient states that she has shortness of breath in the past several days which has been progressively worsening.  Patient has cough with little mucus production and  chest pressure.  Patient was seen by her doctor in [facility] and CT of chest done, which showed moderate layered right pleural effusion.  Patient was sent to ED for further evaluation and treatment.  Patient states that she had nausea, vomiting, diarrhea recently, which has resolved.  Currently no nausea, vomiting, diarrhea or abdominal pain.  No symptoms of UTI.  She complains of her right foot pain in the dorsal side.  No fall or injury.  No fever or chills.   Data reviewed independently and ED Course: pt was found to have WBC 14.3, stable renal function, procalcitonin < 0.10.  Temperature normal, blood pressure 103/91, heart rate 59, RR 24, oxygen saturation 94% on room air.  Patient is admitted to telemetry bed as inpatient.  CT of chest without contrast: 1. Moderate-large layering right-sided pleural effusion with complete atelectasis of the right lower lobe and near-complete atelectasis of the right middle lobe. Partial compressive atelectasis of the right upper lobe. 2. Mild mosaic attenuation of the aerated portion of the right upper lobe and within the left lung, which may be related to small airways disease. 3. Nodular hepatic surface contour, suggestive of cirrhosis. 4. Aortic and coronary artery atherosclerosis (ICD10-I70.0).   EKG: I have personally  reviewed.  Regular rhythm, QTc 496, RAD, low voltage   Review of Systems:   General: no fevers, chills, no body weight gain, has poor appetite, has fatigue HEENT: no blurry vision, hearing changes or sore throat Respiratory: has dyspnea, coughing, no wheezing CV: no chest pain, no palpitations GI: no nausea, vomiting, abdominal pain, diarrhea, constipation GU: no dysuria, burning on urination, increased urinary frequency, hematuria  Ext: no leg edema Neuro: no unilateral weakness, numbness, or tingling, no vision change or hearing loss Skin: no rash, no skin tear. MSK: No muscle spasm, no deformity, no limitation of range of movement in spin. Has right foot pain Heme: No easy bruising.  Travel history: No recent long distant travel.   Allergy:  Allergies  Allergen Reactions   Cephalosporins Itching   Codeine Other (See Comments)    Extreme drowsiness   Contrast Media [Iodinated Contrast Media] Hives and Swelling    Facial swelling   Oxycodone Other (See Comments)    Extreme drowsiness    Past Medical History:  Diagnosis Date   Anemia    Asthma    COPD (chronic obstructive pulmonary disease) (HCC)    Diabetes mellitus without complication (HCC)    type 2   DVT (deep venous thrombosis) (HCC) 1980   GERD (gastroesophageal reflux disease)    History of hiatal hernia    Hypertension    Hypothyroidism     Past Surgical History:  Procedure Laterality Date   CATARACT EXTRACTION Bilateral    CHOLECYSTECTOMY     COLONOSCOPY     ESOPHAGOGASTRODUODENOSCOPY (EGD) WITH PROPOFOL N/A 01/17/2022  Procedure: ESOPHAGOGASTRODUODENOSCOPY (EGD) WITH PROPOFOL;  Surgeon: Jaynie Collins, DO;  Location: [facility] ENDOSCOPY;  Service: Gastroenterology;  Laterality: N/A;   HUMERUS SURGERY Left    fracture (7 surgeries total)   MENISCUS REPAIR Right 2019   THYROIDECTOMY     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 She has never used smokeless tobacco. She reports that she does not drink alcohol and does not use drugs.  Family History:  Family History  Problem Relation Age of Onset   Kidney disease Mother      Prior to Admission medications   Medication Sig Start Date End Date Taking? Authorizing Provider  acetaminophen (TYLENOL) 325 MG tablet Take 1,300 mg by mouth every 6 (six) hours as needed for moderate pain.    [provider]  albuterol (PROVENTIL) (2.5 MG/3ML) 0.083% nebulizer solution Take 2.5 mg by nebulization every 4 (four) hours as needed for wheezing or shortness of breath.    [provider]  albuterol (VENTOLIN HFA) 108 (90 Base) MCG/ACT inhaler Inhale 2-4 puffs by mouth every 4 hours as needed for wheezing, cough, and/or shortness of breath 11/28/20   Loleta Rose, MD  amiodarone (PACERONE) 200 MG tablet Take 1 tablet (200 mg total) by mouth 2 (two) times daily for 4 days, THEN 1 tablet (200 mg total) daily. 05/24/22 06/27/22  Esaw Grandchild A, DO  amoxicillin (AMOXIL) 500 MG capsule Take 500 mg by mouth 3 (three) times daily. 05/16/22   [provider]  apixaban (ELIQUIS) 5 MG TABS tablet Take 1 tablet (5 mg total) by mouth 2 (two) times daily. 05/24/22   Pennie Banter, DO  Ascorbic Acid (VITAMIN C CR) 1500 MG TBCR Take 1 tablet by mouth daily.    [provider]  atorvastatin (LIPITOR) 20 MG tablet Take 20 mg by mouth daily.    [provider]  busPIRone (BUSPAR) 5 MG tablet Take 5 mg by mouth 3 (three) times daily as needed.    [provider]  Carboxymeth-Glycerin-Polysorb (REFRESH OPTIVE ADVANCED) 0.5-1-0.5 % SOLN Apply 2 drops to eye daily.    [provider]  Emollient (EUCERIN) lotion Apply 1 Application topically as needed for dry skin.    [provider]  enalapril (VASOTEC) 20 MG tablet Take 20 mg by mouth daily.    [provider]  esomeprazole (NEXIUM) 40 MG capsule Take 40 mg by mouth 2 (two) times daily. 05/06/22   [provider]  ferrous sulfate 325 (65 FE) MG tablet Take 325 mg by mouth daily.    [provider]  fluticasone (FLONASE) 50 MCG/ACT nasal spray Place 2 sprays into both nostrils daily as needed for allergies or rhinitis.    [provider]  furosemide (LASIX) 20 MG tablet Take 20 mg by mouth daily.    [provider]  ibuprofen (ADVIL) 200 MG tablet Take 400 mg by mouth every 6 (six) hours as needed for mild pain.    [provider]  ketoconazole (  NIZORAL) 2 % shampoo Apply 1 Application topically 2 (two) times a week.    [provider]  ketotifen (ZADITOR) 0.025 % ophthalmic solution Place 1 drop into both eyes daily as needed.    [provider]  levothyroxine (SYNTHROID) 125 MCG tablet Take 125 mcg by mouth daily before breakfast.    [provider]  loratadine (CLARITIN) 10 MG tablet Take 1 tablet (10 mg total) by mouth daily. 05/25/22   Pennie Banter, DO  Magnesium 250 MG TABS Take 1 tablet by mouth daily.    [provider]  metoprolol tartrate (LOPRESSOR) 25 MG tablet Take 1 tablet (25 mg total) by mouth 2 (two) times daily. 05/24/22   Pennie Banter, DO  MUCINEX 600 MG 12 hr tablet Take 1 tablet (600 mg total) by mouth 2 (two) times daily as needed. 05/24/22   Esaw Grandchild A, DO  Omega-3 1000 MG CAPS Take 1 capsule by mouth daily.    [provider]  OXYGEN Inhale 2 L into the lungs at bedtime.    [provider]  potassium chloride (KLOR-CON M) 10 MEQ  tablet Take 10 mEq by mouth daily.    [provider]  sodium chloride (OCEAN) 0.65 % SOLN nasal spray Place 1 spray into both nostrils as needed for congestion. 05/24/22   Esaw Grandchild A, DO  TURMERIC PO Take 1 capsule by mouth daily.    [provider]  VITAMIN D-VITAMIN K PO Take 4,000 Units by mouth daily.    [provider]    Physical Exam: Vitals:   04/07/23 1930 04/07/23 2000 04/07/23 2100 04/07/23 2150  BP: 117/64 (!) 107/58 126/69 133/65  Pulse: (!) 55 (!) 55 (!) 57 (!) 58  Resp: (!) 26 (!) 27 18 16   Temp:  97.7 F (36.5 C)  97.9 F (36.6 C)  TempSrc:  Oral  Oral  SpO2: 93% 94% 93% 96%  Weight:      Height:       General: Not in acute distress HEENT:       Eyes: PERRL, EOMI, no jaundice       ENT: No discharge from the ears and nose, no pharynx injection, no tonsillar enlargement.        Neck: No JVD, no bruit, no mass felt. Heme: No neck lymph node enlargement. Cardiac: S1/S2, RRR, No murmurs, No gallops or rubs. Respiratory: Has decreased air movement bilaterally. GI: Soft, nondistended, nontender, no rebound pain, no organomegaly, BS present. GU: No hematuria Ext: No pitting leg edema bilaterally. 1+DP/PT pulse bilaterally. Musculoskeletal: No joint deformities, No joint redness or warmth, no limitation of ROM in spin.  Has tenderness in the dorsal side of right foot Skin: No rashes.  Neuro: Alert, oriented X3, cranial nerves II-XII grossly intact, moves all extremities normally.   Psych: Patient is not psychotic, no suicidal or hemocidal ideation.  Labs on Admission: I have personally reviewed following labs and imaging studies  CBC: Recent Labs  Lab 04/07/23 1806  WBC 14.3*  NEUTROABS 10.7*  HGB 13.7  HCT 40.2  MCV 90.5  PLT 240   Basic Metabolic Panel: Recent Labs  Lab 04/07/23 1806  NA 136  K 4.2  CL 102  CO2 23  GLUCOSE 111*  BUN 33*  CREATININE 1.02*  CALCIUM 9.1   GFR: Estimated Creatinine Clearance: 39  mL/min (A) (by C-G formula based on SCr of 1.02 mg/dL (H)). Liver Function Tests: No results for input(s): "AST", "ALT", "ALKPHOS", "BILITOT", "  PROT", "ALBUMIN" in the last 168 hours. No results for input(s): "LIPASE", "AMYLASE" in the last 168 hours. No results for input(s): "AMMONIA" in the last 168 hours. Coagulation Profile: Recent Labs  Lab 04/07/23 2313  INR 1.7*   Cardiac Enzymes: No results for input(s): "CKTOTAL", "CKMB", "CKMBINDEX", "TROPONINI" in the last 168 hours. BNP (last 3 results) No results for input(s): "PROBNP" in the last 8760 hours. HbA1C: No results for input(s): "HGBA1C" in the last 72 hours. CBG: No results for input(s): "GLUCAP" in the last 168 hours. Lipid Profile: No results for input(s): "CHOL", "HDL", "LDLCALC", "TRIG", "CHOLHDL", "LDLDIRECT" in the last 72 hours. Thyroid Function Tests: No results for input(s): "TSH", "T4TOTAL", "FREET4", "T3FREE", "THYROIDAB" in the last 72 hours. Anemia Panel: No results for input(s): "VITAMINB12", "FOLATE", "FERRITIN", "TIBC", "IRON", "RETICCTPCT" in the last 72 hours. Urine analysis:    Component Value Date/Time   COLORURINE STRAW (A) 03/02/2022 1117   APPEARANCEUR CLEAR (A) 03/02/2022 1117   LABSPEC 1.008 03/02/2022 1117   PHURINE 5.0 03/02/2022 1117   GLUCOSEU NEGATIVE 03/02/2022 1117   HGBUR SMALL (A) 03/02/2022 1117   BILIRUBINUR NEGATIVE 03/02/2022 1117   KETONESUR NEGATIVE 03/02/2022 1117   PROTEINUR NEGATIVE 03/02/2022 1117   NITRITE NEGATIVE 03/02/2022 1117   LEUKOCYTESUR NEGATIVE 03/02/2022 1117   Sepsis Labs: @LABRCNTIP (procalcitonin:4,lacticidven:4) )No results found for this or any previous visit (from the past 240 hour(s)).   Radiological Exams on Admission: CT CHEST WO CONTRAST  Result Date: 04/07/2023 CLINICAL DATA:  Chest pain, shortness of breath EXAM: CT CHEST WITHOUT CONTRAST TECHNIQUE: Multidetector CT imaging of the chest was performed following the standard protocol without IV  contrast. RADIATION DOSE REDUCTION: This exam was performed according to the departmental dose-optimization program which includes automated exposure control, adjustment of the mA and/or kV according to patient size and/or use of iterative reconstruction technique. COMPARISON:  CT 05/23/2022 FINDINGS: Cardiovascular: Heart size is upper limits of normal. No pericardial effusion. Thoracic aorta is nonaneurysmal. There are atherosclerotic vascular calcifications of the aorta and coronary arteries. Calcification of the mitral annulus. Central pulmonary vasculature is nondilated. Mediastinum/Nodes: No enlarged mediastinal or axillary lymph nodes. Thyroid gland, trachea, and esophagus demonstrate no significant findings. Small hiatal hernia. Lungs/Pleura: Moderate-large layering right-sided pleural effusion with complete atelectasis of the right lower lobe and near-complete atelectasis of the right middle lobe. Partial compressive atelectasis of the right upper lobe. Mild mosaic attenuation of the aerated portion of the right upper lobe and within the left lung. No left-sided pleural effusion. No pneumothorax. Upper Abdomen: Nodular hepatic surface contour. No acute abnormality. Musculoskeletal: No chest wall mass or suspicious bone lesions identified. Left chest wall loop recorder. IMPRESSION: 1. Moderate-large layering right-sided pleural effusion with complete atelectasis of the right lower lobe and near-complete atelectasis of the right middle lobe. Partial compressive atelectasis of the right upper lobe. 2. Mild mosaic attenuation of the aerated portion of the right upper lobe and within the left lung, which may be related to small airways disease. 3. Nodular hepatic surface contour, suggestive of cirrhosis. 4. Aortic and coronary artery atherosclerosis (ICD10-I70.0). Electronically Signed   By: Duanne Guess D.O.   On: 04/07/2023 15:25      Assessment/Plan Principal Problem:   Pleural effusion on  right Active Problems:   HTN (hypertension)   Type II diabetes mellitus with renal manifestations (HCC)   COPD (chronic obstructive pulmonary disease) (HCC)   Hypothyroidism   Chronic kidney disease, stage 3a (HCC)   Atrial fibrillation, chronic (HCC)   DVT (  deep venous thrombosis) (HCC)   Right foot pain   Assessment and Plan:  Pleural effusion on right:  CT scan showed moderate-large layering right-sided pleural effusion with complete atelectasis of the right lower lobe and near-complete atelectasis of the right middle lobe. Partial compressive atelectasis of the right upper lobe.  Patient does not have fever, but has leukocytosis with WBC 14.3.  Will start antibiotics empirically.  -Admitted to telemetry bed as inpatient -Start vancomycin and cefepime -Blood culture -Ordered thoracentesis to be done by IR -Bronchodilators and PR Mucinex   HTN (hypertension) -IV hydralazine as needed -Enalapril, metoprolol  Diet controlled type II diabetes mellitus with renal manifestations [facility]): Recent A1c 7.8, poorly controlled.  Patient not taking medications currently.  Blood sugar 111 today. -Check CBG every morning  COPD (chronic obstructive pulmonary disease) (HCC) -Bronchodilators, as needed Mucinex  Hypothyroidism -Synthroid  Chronic kidney disease, stage 3a (HCC): Renal function stable.  Creatinine 1.02, BUN 33, GFR 56 (recent baseline creatinine 1.13 on 03/05/2023) -Follow-up with CBC  Atrial fibrillation, chronic (HCC) -Metoprolol and amiodarone -Switch Eliquis to IV heparin for doing thoracentesis  DVT (deep venous thrombosis) (HCC) -Switched Eliquis to IV heparin  Right foot pain: Etiology is not clear. -As needed Tylenol -Follow-up x-ray of right foot         DVT ppx: on IV Heparin  Code Status: Full code    Family Communication:  Yes, patient's son  by phone  Disposition Plan:  Anticipate discharge back to previous environment  Consults called:   none  Admission status and Level of care: Telemetry Medical:    as inpt      Dispo: The patient is from: Home              Anticipated d/c is to: Home              Anticipated d/c date is: 2 days              Patient currently is not medically stable to d/c.    Severity of Illness:  The appropriate patient status for this patient is INPATIENT. Inpatient status is judged to be reasonable and necessary in order to provide the required intensity of service to ensure the patient's safety. The patient's presenting symptoms, physical exam findings, and initial radiographic and laboratory data in the context of their chronic comorbidities is felt to place them at high risk for further clinical deterioration. Furthermore, it is not anticipated that the patient will be medically stable for discharge from the hospital within 2 midnights of admission.   * I certify that at the point of admission it is my clinical judgment that the patient will require inpatient hospital care spanning beyond 2 midnights from the point of admission due to high intensity of service, high risk for further deterioration and high frequency of surveillance required.*       Date of Service 04/08/2023    Lorretta Harp [facility]   If 7PM-7AM, please contact night-coverage [email or web address] 04/08/2023, 12:03 AM

## 2023-04-08 ENCOUNTER — Inpatient Hospital Stay: Payer: Medicare (Managed Care)

## 2023-04-08 DIAGNOSIS — J90 Pleural effusion, not elsewhere classified: Secondary | ICD-10-CM | POA: Diagnosis not present

## 2023-04-08 LAB — BASIC METABOLIC PANEL
Anion gap: 10 (ref 5–15)
BUN: 36 mg/dL — ABNORMAL HIGH (ref 8–23)
CO2: 23 mmol/L (ref 22–32)
Calcium: 8.6 mg/dL — ABNORMAL LOW (ref 8.9–10.3)
Chloride: 103 mmol/L (ref 98–111)
Creatinine, Ser: 1.07 mg/dL — ABNORMAL HIGH (ref 0.44–1.00)
GFR, Estimated: 53 mL/min — ABNORMAL LOW (ref >60)
Glucose, Bld: 106 mg/dL — ABNORMAL HIGH (ref 70–99)
Potassium: 4.4 mmol/L (ref 3.5–5.1)
Sodium: 136 mmol/L (ref 135–145)

## 2023-04-08 LAB — HEPATIC FUNCTION PANEL
ALT: 14 U/L (ref 0–44)
AST: 31 U/L (ref 15–41)
Albumin: 3.5 g/dL (ref 3.5–5.0)
Alkaline Phosphatase: 34 U/L — ABNORMAL LOW (ref 38–126)
Bilirubin, Direct: 0.3 mg/dL — ABNORMAL HIGH (ref 0.0–0.2)
Indirect Bilirubin: 0.9 mg/dL (ref 0.3–0.9)
Total Bilirubin: 1.2 mg/dL (ref 0.3–1.2)
Total Protein: 6.8 g/dL (ref 6.5–8.1)

## 2023-04-08 LAB — CBC
HCT: 38.6 % (ref 36.0–46.0)
Hemoglobin: 12.8 g/dL (ref 12.0–15.0)
MCH: 30.9 pg (ref 26.0–34.0)
MCHC: 33.2 g/dL (ref 30.0–36.0)
MCV: 93.2 fL (ref 80.0–100.0)
Platelets: 205 10*3/uL (ref 150–400)
RBC: 4.14 MIL/uL (ref 3.87–5.11)
RDW: 13.8 % (ref 11.5–15.5)
WBC: 11.2 10*3/uL — ABNORMAL HIGH (ref 4.0–10.5)
nRBC: 0 % (ref 0.0–0.2)

## 2023-04-08 LAB — HEPARIN LEVEL (UNFRACTIONATED): Heparin Unfractionated: >1.1 [IU]/mL — ABNORMAL HIGH (ref 0.30–0.70)

## 2023-04-08 LAB — APTT
aPTT: 120 s — ABNORMAL HIGH (ref 24–36)
aPTT: 97 s — ABNORMAL HIGH (ref 24–36)

## 2023-04-08 LAB — LACTATE DEHYDROGENASE: LDH: 260 U/L — ABNORMAL HIGH (ref 98–192)

## 2023-04-08 MED ORDER — POTASSIUM CHLORIDE CRYS ER 10 MEQ PO TBCR
10.0000 meq | EXTENDED_RELEASE_TABLET | Freq: Every day | ORAL | Status: DC
  Administered 2023-04-09 – 2023-04-12 (×4): 10 meq via ORAL
  Filled 2023-04-08 (×4): qty 1

## 2023-04-08 MED ORDER — ALPRAZOLAM 0.25 MG PO TABS
0.2500 mg | ORAL_TABLET | Freq: Two times a day (BID) | ORAL | Status: DC | PRN
  Administered 2023-04-08 – 2023-04-11 (×2): 0.25 mg via ORAL
  Filled 2023-04-08 (×2): qty 1

## 2023-04-08 MED ORDER — FLUTICASONE PROPIONATE 50 MCG/ACT NA SUSP: 2.0000 | Freq: Every day | NASAL | Status: DC | PRN

## 2023-04-08 MED ORDER — HEPARIN (PORCINE) 25000 UT/250ML-% IV SOLN
900.0000 [IU]/h | INTRAVENOUS | Status: DC
  Administered 2023-04-08: 1000 [IU]/h via INTRAVENOUS
  Administered 2023-04-09: 900 [IU]/h via INTRAVENOUS
  Filled 2023-04-08 (×2): qty 250

## 2023-04-08 MED ORDER — FLUTICASONE FUROATE-VILANTEROL 200-25 MCG/ACT IN AEPB
1.0000 | INHALATION_SPRAY | Freq: Every day | RESPIRATORY_TRACT | Status: DC
  Filled 2023-04-08: qty 28

## 2023-04-08 MED ORDER — LORATADINE 10 MG PO TABS
10.0000 mg | ORAL_TABLET | Freq: Every day | ORAL | Status: DC
  Administered 2023-04-08 – 2023-04-12 (×5): 10 mg via ORAL
  Filled 2023-04-08 (×5): qty 1

## 2023-04-08 MED ORDER — FUROSEMIDE 20 MG PO TABS
20.0000 mg | ORAL_TABLET | Freq: Every day | ORAL | Status: DC
  Administered 2023-04-08 – 2023-04-09 (×2): 20 mg via ORAL
  Filled 2023-04-08 (×2): qty 1

## 2023-04-08 MED ORDER — TRAZODONE HCL 50 MG PO TABS
50.0000 mg | ORAL_TABLET | Freq: Every evening | ORAL | Status: DC | PRN
  Administered 2023-04-09 – 2023-04-11 (×3): 50 mg via ORAL
  Filled 2023-04-08 (×3): qty 1

## 2023-04-08 MED ORDER — ROPINIROLE HCL 0.25 MG PO TABS
0.2500 mg | ORAL_TABLET | Freq: Two times a day (BID) | ORAL | Status: DC
  Administered 2023-04-08 – 2023-04-12 (×7): 0.25 mg via ORAL
  Filled 2023-04-08 (×8): qty 1

## 2023-04-08 MED ORDER — DIPHENHYDRAMINE HCL 50 MG/ML IJ SOLN: 12.5000 mg | Freq: Four times a day (QID) | INTRAMUSCULAR | Status: DC | PRN

## 2023-04-08 NOTE — Hospital Course (Addendum)
HPI:  [name] is a 77 y.o. female with medical history significant of a fib and DVT on Eliquis, HTN, diet-controlled diabetes, COPD on 2 L oxygen at night, hypothyroidism, GERD, depression, anemia, CKD-3A, who presents with shortness of breath x several days progressively worsening.  Cough with little mucus production, chest pressure.PCP at [facility] - CT of chest done, moderate layered right pleural effusion.  Patient sent to ED  Hospital course / significant events:  11/01: to ED, admitted to hospitalist service. Thoracentesis pending.  11/02: reports persistent chest tightness and SOB, thoracentesis pending 11/03: thoracentesis today fluid labs pending, echo pending. Wait 24h at least (utnil tomorrow approx 4:00 PM) prior to restart anticoagulation. Pt is set up w/ pulmonary follow up outpatient Weds 11/06 11/04: hypotensive, pending echo, adjusting antihypertensive meds, PT/OT eval pending d/t significant weakness.  11/05: CXR a bit worse on R question effusion/infiltrate, will cover for PNA and increase lasix. Pleural cytology pending   Consultants:  none  Procedures/Surgeries: 04/12/23 thoracentesis w/ Dr Aundria Rud      ASSESSMENT & PLAN:   Pleural effusion on right, etiology concerning for malignancy vs infection vs hepatic Secondhand smoke exposure   S/p thoracentesis 04/09/23 Pleural fluid analysis (+)leukocytosis monocyte predominant, and significant elevation in LDH. DDx infectious etiologies, malignancy.  Pleural Cytology and AFB cultures were sent - awaiting results   Complete antiobiotic course Concern for cirrhosis on CT - hepatic labs normal --> follow outpatient w/ GI  Echo EF 55% Increased home lasix to 20 mg bid     Hx Essential HTN (hypertension) BP soft here but is improving  Holding Enalapril Reduced metoprolol 25 --> 12.5 mg bid  Reduced amiodarone 200 --> 100 mg daily    Diet controlled type II diabetes mellitus with renal manifestations  Recent A1c 7.8,  diet controlled.   CBG every morning   COPD (chronic obstructive pulmonary disease), Asthma  Bronchodilators Pt declines inhalers    Hypothyroidism Synthroid   Chronic kidney disease, stage 3a (HCC):  Renal function stable. Last creatinine 0.87   Atrial fibrillation, chronic  Currently sinus rhythm  Metoprolol, amiodarone - reduced dose d/t low BP, see above  Eliquis was intiially held and pt was on IV heparin pending thoracentesis, pt very nervous to restart heparin infusion.  Can go back on eliquis.   Hx DVT (deep venous thrombosis)  Plan eliquis on discharge, holding for now in case other procedures needed - see above    Right foot pain: no fracture Falls at home  Pain control PT/OT   Chest tightness - resolved Anxiety component - family states anxiety worse past few months  On buspar at home  Does not meet criteria for AKI Caution w/ lasix    Overweight based on BMI: Body mass index is 25.89 kg/m.  Underweight - under 18.5  normal weight - 18.5 to 24.9 overweight - 25 to 29.9 obese - 30 or more  Constipation- resolved.  Advised caution with immodium.   DVT prophylaxis: on heparin sq IV fluids: no continuous IV fluids  Nutrition: cardiac/carb diet  Central lines / invasive devices: none  Code Status: FULL CODE ACP documentation reviewed: has MOST on file in [facility]  TOC needs: home health  Barriers to dispo / significant pending items:  hopefully diurese some and stabilize overnight, pt is very anxious but if remains stable into tomorrow should be ok for discharge

## 2023-04-08 NOTE — Progress Notes (Signed)
ANTICOAGULATION CONSULT NOTE  Pharmacy Consult for Heparin Infusion Indication: AF  Allergies  Allergen Reactions   Cephalosporins Itching   Codeine Other (See Comments)    Extreme drowsiness   Contrast Media [Iodinated Contrast Media] Hives and Swelling    Facial swelling   Oxycodone Other (See Comments)    Extreme drowsiness    Patient Measurements: Height: 5\' 2"  (157.5 cm) Weight: 64.2 kg (141 lb 8.6 oz) IBW/kg (Calculated) : 50.1 Heparin Dosing Weight: 61.1 kg  Vital Signs: Temp: 97.9 F (36.6 C) (11/02 1727) Temp Source: Oral (11/02 1727) BP: 111/64 (11/02 1727) Pulse Rate: 63 (11/02 1727)  Labs: Recent Labs    04/07/23 1806 04/07/23 2313 04/08/23 0627 04/08/23 1029  HGB 13.7  --  12.8  --   HCT 40.2  --  38.6  --   PLT 240  --  205  --   APTT  --  42*  --  120*  LABPROT  --  19.8*  --   --   INR  --  1.7*  --   --   HEPARINUNFRC  --   --   --  >1.10*  CREATININE 1.02*  --  1.07*  --     Estimated Creatinine Clearance: 38.1 mL/min (A) (by C-G formula based on SCr of 1.07 mg/dL (H)).   Medical History: Past Medical History:  Diagnosis Date   Anemia    Asthma    COPD (chronic obstructive pulmonary disease) (HCC)    Diabetes mellitus without complication (HCC)    type 2   DVT (deep venous thrombosis) (HCC) 1980   GERD (gastroesophageal reflux disease)    History of hiatal hernia    Hypertension    Hypothyroidism     Assessment: [name] is a 77 y.o. female presenting after abnormal CT as outpatient. PMH significant for HTN, COPD, hypothyroidism, T2DM, AF, IDA. Patient was on [facility] PTA per chart review. Last dose of apixaban was 10/31. Pharmacy has been consulted to initiate and manage heparin infusion.   Baseline Labs: aPTT 42, PT 19.8, INR 1.7, Hgb 14.3, Hct 40.2, Plt 240   Goal of Therapy:  Heparin level 0.3-0.7 units/ml aPTT 66-102 seconds Monitor platelets by anticoagulation protocol: Yes  Date Time aPTT/HL Rate/Comment   11/2 1029 120/>1.10 1000/supratherapeutic 11/2 2009 97/---  900/therapeutic x1  Plan: Continue heparin infusion at 900 units/hr Check aPTT in 8 hours Continue to monitor aPTT until HL and aPTT correlate.  Check HL daily for correlation until HL and aPTT correlate. Switch to HL monitoring once HL and aPTT correlate.  Continue to monitor H&H and platelets daily while on heparin infusion    Celene Squibb, PharmD Clinical Pharmacist 04/08/2023 6:36 PM

## 2023-04-08 NOTE — ED Notes (Signed)
Pt reports head is itching. States antibiotic allergy is to cephalosporins. Denies throat swelling/difficulty speaking. No rash/hives visible. Orders received from MD.

## 2023-04-08 NOTE — Progress Notes (Signed)
ANTICOAGULATION CONSULT NOTE  Pharmacy Consult for heparin infusion Indication: VTE Tx  Allergies  Allergen Reactions   Cephalosporins Itching   Codeine Other (See Comments)    Extreme drowsiness   Contrast Media [Iodinated Contrast Media] Hives and Swelling    Facial swelling   Oxycodone Other (See Comments)    Extreme drowsiness    Patient Measurements: Height: 5\' 1"  (154.9 cm) Weight: 64.4 kg (142 lb) IBW/kg (Calculated) : 47.8 Heparin Dosing Weight: 61.1 kg  Vital Signs: Temp: 98 F (36.7 C) (11/02 0733) Temp Source: Oral (11/02 0733) BP: 114/63 (11/02 1030) Pulse Rate: 62 (11/02 1030)  Labs: Recent Labs    04/07/23 1806 04/07/23 2313 04/08/23 0627 04/08/23 1029  HGB 13.7  --  12.8  --   HCT 40.2  --  38.6  --   PLT 240  --  205  --   APTT  --  42*  --  120*  LABPROT  --  19.8*  --   --   INR  --  1.7*  --   --   HEPARINUNFRC  --   --   --  >1.10*  CREATININE 1.02*  --  1.07*  --     Estimated Creatinine Clearance: 37.2 mL/min (A) (by C-G formula based on SCr of 1.07 mg/dL (H)).   Medical History: Past Medical History:  Diagnosis Date   Anemia    Asthma    COPD (chronic obstructive pulmonary disease) (HCC)    Diabetes mellitus without complication (HCC)    type 2   DVT (deep venous thrombosis) (HCC) 1980   GERD (gastroesophageal reflux disease)    History of hiatal hernia    Hypertension    Hypothyroidism     Medications:  PTA Meds: Eliquis 5 mg BID, last dose unknown  Assessment: Pt is a 77 yo female presenting to ED d/t concerns of abnormal CT scan outpatient.  Pt with hx of A fib and DVT on Elquis, now has right pleural effusion, will need thoracentesis.  Goal of Therapy:  Heparin level 0.3-0.7 units/ml aPTT 66-102 seconds Monitor platelets by anticoagulation protocol: Yes  11/02@1029 : aPTT 120 sec, SUPRAtherapeutic@900  units/hr   Plan:  Decrease heparin infusion at 900 units/hr Will check aPTT in 8 hr after start of infusion Will  follow aPTT until correlation w/ HL confirmed HL & CBC daily while on heparin  Bettey Costa, PharmD Clinical Pharmacist 04/08/2023 11:22 AM

## 2023-04-08 NOTE — Progress Notes (Addendum)
PROGRESS NOTE    [name]   ZOX:[id number] DOB: [birth year]  DOA: 04/07/2023 Date of Service: 04/08/23 which is hospital day 1  PCP: Pcp, No    HPI:  [name] is a 77 y.o. female with medical history significant of a fib and DVT on Eliquis, HTN, diet-controlled diabetes, COPD on 2 L oxygen at night, hypothyroidism, GERD, depression, anemia, CKD-3A, who presents with shortness of breath x several days progressively worsening.  Cough with little mucus production, chest pressure.PCP at [facility] - CT of chest done, moderate layered right pleural effusion.  Patient sent to ED  Hospital course / significant events:  11/01: to ED, admitted to hospitalist service. Thoracentesis pending.  11/02: reports persistent chest tightness and SOB, thoracentesis pending   Consultants:  none  Procedures/Surgeries: none      ASSESSMENT & PLAN:   Pleural effusion on right:   Patient does not have fever, but has leukocytosis with WBC 14.3.   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   CBG every morning   COPD (chronic obstructive pulmonary disease) (HCC) Bronchodilators, as needed Mucinex   Hypothyroidism Synthroid   Chronic kidney disease, stage 3a (HCC):  Renal function stable. Monitor BMP   Atrial fibrillation, chronic  Metoprolol, amiodarone Eliquis to IV heparin pending thoracentesis   Hx DVT (deep venous thrombosis)  Heparin as above Plan eliquis on discharge / when able    Right foot pain: no fracture Pain  control PT/OT following thoracentesis   Chest tightness Anxiety component  Reassurance that she is on heart monitor and oxygen monitor EKG for completeness  Continue telemetry and continuous O2  Hold heavily sedating medications w/ risk respiratory compromise but will trial low dose Xanax and will trial trazodone prn insmonia     Overweight based on BMI: Body mass index is 26.83 kg/m.  Underweight - under 18.5  normal weight - 18.5 to 24.9 overweight - 25 to 29.9 obese - 30 or more   DVT prophylaxis: on heparin  IV fluids: no continuous IV fluids  Nutrition: cardiac/carb diet  Central lines / invasive devices: none  Code Status: FULL CODE ACP documentation reviewed: has MOST on file in [facility]  TOC needs: TBD Barriers to dispo / significant pending items: thoracentesis, workup for effusion              Subjective / Brief ROS:  Patient reports chest pressure and SOB  Feels worse from yesterday, gradual worsening No chest pain/sharp pain Denies new weakness.  Tolerating diet.  Reports no concerns w/ urination/defecation.   Family Communication: spoke on phone w/ don [name] .04/08/23  2:44 PM     Objective Findings:  Vitals:   04/08/23 1230 04/08/23 1300 04/08/23 1330 04/08/23 1400  BP: 112/69 (!) 105/51 106/72 110/63  Pulse: 60 (!) 52 61 68  Resp:      Temp:      TempSrc:      SpO2: 96% 94% 98% 96%  Weight:      Height:       No intake or output data in the 24  hours ending 04/08/23 1444 Filed Weights   04/07/23 1705  Weight: 64.4 kg    Examination:  Physical Exam Constitutional:      General: She is not in acute distress.    Appearance: She is not toxic-appearing.  Cardiovascular:     Rate and Rhythm: Normal rate. Rhythm irregular.     Heart sounds: Murmur heard.  Pulmonary:     Breath sounds: Examination of the right-middle field reveals decreased breath sounds. Examination of the right-lower field reveals decreased breath sounds.  Decreased breath sounds present.  Abdominal:     Palpations: Abdomen is soft.  Musculoskeletal:     Right lower leg: No edema.     Left lower leg: No edema.  Skin:    General: Skin is warm and dry.  Neurological:     General: No focal deficit present.     Mental Status: She is alert.  Psychiatric:        Mood and Affect: Mood is anxious.        Behavior: Behavior normal.          Scheduled Medications:   amiodarone  200 mg Oral Daily   enalapril  20 mg Oral Daily   ferrous sulfate  325 mg Oral Daily   fluticasone furoate-vilanterol  1 puff Inhalation Daily   furosemide  20 mg Oral Daily   levothyroxine  125 mcg Oral Q0600   loratadine  10 mg Oral Daily   metoprolol tartrate  25 mg Oral BID   pantoprazole  40 mg Oral Daily   [START ON 04/09/2023] potassium chloride  10 mEq Oral Daily   rOPINIRole  0.25 mg Oral BID    Continuous Infusions:  ceFEPime (MAXIPIME) IV Stopped (04/08/23 1152)   heparin 900 Units/hr (04/08/23 1158)   vancomycin      PRN Medications:  acetaminophen, albuterol, ALPRAZolam, dextromethorphan-guaiFENesin, diphenhydrAMINE, fluticasone, hydrALAZINE, ondansetron (ZOFRAN) IV, polyvinyl alcohol, sodium chloride, traZODone  Antimicrobials from admission:  Anti-infectives (From admission, onward)    Start     Dose/Rate Route Frequency Ordered Stop   04/08/23 2200  vancomycin (VANCOREADY) IVPB 750 mg/150 mL        750 mg 150 mL/hr over 60 Minutes Intravenous Every 24 hours 04/07/23 2134     04/07/23 2200  ceFEPIme (MAXIPIME) 2 g in sodium chloride 0.9 % 100 mL IVPB        2 g 200 mL/hr over 30 Minutes Intravenous Every 12 hours 04/07/23 2131     04/07/23 2145  vancomycin (VANCOREADY) IVPB 1250 mg/250 mL        1,250 mg 166.7 mL/hr over 90 Minutes Intravenous  Once 04/07/23 2131 04/08/23 0001           Data Reviewed:  I have personally reviewed the following...  CBC: Recent Labs  Lab 04/07/23 1806 04/08/23 0627  WBC 14.3* 11.2*   NEUTROABS 10.7*  --   HGB 13.7 12.8  HCT 40.2 38.6  MCV 90.5 93.2  PLT 240 205   Basic Metabolic Panel: Recent Labs  Lab 04/07/23 1806 04/08/23 0627  NA 136 136  K 4.2 4.4  CL 102 103  CO2 23 23  GLUCOSE 111* 106*  BUN 33* 36*  CREATININE 1.02* 1.07*  CALCIUM 9.1 8.6*   GFR: Estimated Creatinine Clearance: 37.2 mL/min (A) (by C-G formula based on SCr of 1.07 mg/dL (H)). Liver Function Tests: Recent Labs  Lab 04/08/23 0627  AST 31  ALT 14  ALKPHOS 34*  BILITOT 1.2  PROT  6.8  ALBUMIN 3.5   No results for input(s): "LIPASE", "AMYLASE" in the last 168 hours. No results for input(s): "AMMONIA" in the last 168 hours. Coagulation Profile: Recent Labs  Lab 04/07/23 2313  INR 1.7*   Cardiac Enzymes: No results for input(s): "CKTOTAL", "CKMB", "CKMBINDEX", "TROPONINI" in the last 168 hours. BNP (last 3 results) No results for input(s): "PROBNP" in the last 8760 hours. HbA1C: No results for input(s): "HGBA1C" in the last 72 hours. CBG: No results for input(s): "GLUCAP" in the last 168 hours. Lipid Profile: No results for input(s): "CHOL", "HDL", "LDLCALC", "TRIG", "CHOLHDL", "LDLDIRECT" in the last 72 hours. Thyroid Function Tests: No results for input(s): "TSH", "T4TOTAL", "FREET4", "T3FREE", "THYROIDAB" in the last 72 hours. Anemia Panel: No results for input(s): "VITAMINB12", "FOLATE", "FERRITIN", "TIBC", "IRON", "RETICCTPCT" in the last 72 hours. Most Recent Urinalysis On File:     Component Value Date/Time   COLORURINE STRAW (A) 03/02/2022 1117   APPEARANCEUR CLEAR (A) 03/02/2022 1117   LABSPEC 1.008 03/02/2022 1117   PHURINE 5.0 03/02/2022 1117   GLUCOSEU NEGATIVE 03/02/2022 1117   HGBUR SMALL (A) 03/02/2022 1117   BILIRUBINUR NEGATIVE 03/02/2022 1117   KETONESUR NEGATIVE 03/02/2022 1117   PROTEINUR NEGATIVE 03/02/2022 1117   NITRITE NEGATIVE 03/02/2022 1117   LEUKOCYTESUR NEGATIVE 03/02/2022 1117   Sepsis  Labs: @LABRCNTIP (procalcitonin:4,lacticidven:4) Microbiology: Recent Results (from the past 240 hour(s))  Culture, blood (Routine X 2) w Reflex to ID Panel     Status: None (Preliminary result)   Collection Time: 04/07/23 11:13 PM   Specimen: BLOOD RIGHT ARM  Result Value Ref Range Status   Specimen Description BLOOD RIGHT ARM  Final   Special Requests   Final    BOTTLES DRAWN AEROBIC AND ANAEROBIC Blood Culture adequate volume   Culture   Final    NO GROWTH < 12 HOURS Performed at [facility], [address] [place], [place] [address]    Report Status PENDING  Incomplete  Culture, blood (Routine X 2) w Reflex to ID Panel     Status: None (Preliminary result)   Collection Time: 04/07/23 11:14 PM   Specimen: BLOOD RIGHT HAND  Result Value Ref Range Status   Specimen Description BLOOD RIGHT HAND  Final   Special Requests   Final    BOTTLES DRAWN AEROBIC AND ANAEROBIC Blood Culture adequate volume   Culture   Final    NO GROWTH < 12 HOURS Performed at [facility], [address] [place], [place] [address]    Report Status PENDING  Incomplete      Radiology Studies last 3 days: DG Foot Complete Right  Result Date: 04/08/2023 CLINICAL DATA:  Right foot pain EXAM: RIGHT FOOT COMPLETE - 3+ VIEW COMPARISON:  None Available. FINDINGS: No fracture or dislocation is seen. Mild degenerative changes of the 1st MCP joint. Degenerative changes of the dorsal midfoot. Moderate posterior and plantar enthesophytes. IMPRESSION: No fracture or dislocation is seen. Degenerative changes, as above. Electronically Signed   By: Charline Bills M.D.   On: 04/08/2023 01:55   CT CHEST WO CONTRAST  Result Date: 04/07/2023 CLINICAL DATA:  Chest pain, shortness of breath EXAM: CT CHEST WITHOUT CONTRAST TECHNIQUE: Multidetector CT imaging of the chest was performed following the standard protocol without IV contrast. RADIATION DOSE REDUCTION: This exam was performed according to the  departmental dose-optimization program which includes automated exposure control, adjustment of the mA and/or kV according to patient size and/or use of iterative reconstruction technique. COMPARISON:  CT  05/23/2022 FINDINGS: Cardiovascular: Heart size is upper limits of normal. No pericardial effusion. Thoracic aorta is nonaneurysmal. There are atherosclerotic vascular calcifications of the aorta and coronary arteries. Calcification of the mitral annulus. Central pulmonary vasculature is nondilated. Mediastinum/Nodes: No enlarged mediastinal or axillary lymph nodes. Thyroid gland, trachea, and esophagus demonstrate no significant findings. Small hiatal hernia. Lungs/Pleura: Moderate-large layering right-sided pleural effusion with complete atelectasis of the right lower lobe and near-complete atelectasis of the right middle lobe. Partial compressive atelectasis of the right upper lobe. Mild mosaic attenuation of the aerated portion of the right upper lobe and within the left lung. No left-sided pleural effusion. No pneumothorax. Upper Abdomen: Nodular hepatic surface contour. No acute abnormality. Musculoskeletal: No chest wall mass or suspicious bone lesions identified. Left chest wall loop recorder. IMPRESSION: 1. Moderate-large layering right-sided pleural effusion with complete atelectasis of the right lower lobe and near-complete atelectasis of the right middle lobe. Partial compressive atelectasis of the right upper lobe. 2. Mild mosaic attenuation of the aerated portion of the right upper lobe and within the left lung, which may be related to small airways disease. 3. Nodular hepatic surface contour, suggestive of cirrhosis. 4. Aortic and coronary artery atherosclerosis (ICD10-I70.0). Electronically Signed   By: Duanne Guess D.O.   On: 04/07/2023 15:25       Time spent: 50 min    Sunnie Nielsen, DO [facility] 04/08/2023, 2:44 PM    Dictation software may have been used to generate  the above note. Typos may occur and escape review in typed/dictated notes. Please contact Dr Lyn Hollingshead directly for clarity if needed.  Staff may message me via secure chat in Epic  but this may not receive an immediate response,  please page me for urgent matters!  If 7PM-7AM, please contact night coverage [email or web address]

## 2023-04-08 NOTE — Progress Notes (Signed)
ANTICOAGULATION CONSULT NOTE  Pharmacy Consult for heparin infusion Indication: VTE Tx  Allergies  Allergen Reactions   Cephalosporins Itching   Codeine Other (See Comments)    Extreme drowsiness   Contrast Media [Iodinated Contrast Media] Hives and Swelling    Facial swelling   Oxycodone Other (See Comments)    Extreme drowsiness    Patient Measurements: Height: 5\' 1"  (154.9 cm) Weight: 64.4 kg (142 lb) IBW/kg (Calculated) : 47.8 Heparin Dosing Weight: 61.1 kg  Vital Signs: Temp: 97.9 F (36.6 C) (11/01 2150) Temp Source: Oral (11/01 2150) BP: 133/65 (11/01 2150) Pulse Rate: 58 (11/01 2150)  Labs: Recent Labs    04/07/23 1806 04/07/23 2313  HGB 13.7  --   HCT 40.2  --   PLT 240  --   APTT  --  42*  LABPROT  --  19.8*  INR  --  1.7*  CREATININE 1.02*  --     Estimated Creatinine Clearance: 39 mL/min (A) (by C-G formula based on SCr of 1.02 mg/dL (H)).   Medical History: Past Medical History:  Diagnosis Date   Anemia    Asthma    COPD (chronic obstructive pulmonary disease) (HCC)    Diabetes mellitus without complication (HCC)    type 2   DVT (deep venous thrombosis) (HCC) 1980   GERD (gastroesophageal reflux disease)    History of hiatal hernia    Hypertension    Hypothyroidism     Medications:  PTA Meds: Eliquis 5 mg BID, last dose unknown  Assessment: Pt is a 77 yo female presenting to ED d/t concerns of abnormal CT scan outpatient.  Pt with hx of A fib and DVT on Elquis, now has right pleural effusion, will need thoracentesis.  Goal of Therapy:  Heparin level 0.3-0.7 units/ml aPTT 66-102 seconds Monitor platelets by anticoagulation protocol: Yes   Plan:  No initial bolus given Start heparin infusion at 1000 units/hr Will follow aPTT until correlation w/ HL confirmed Will check aPTT in 8 hr after start of infusion HL & CBC daily while on heparin  Otelia Sergeant, PharmD, [facility] 04/08/2023 12:06 AM

## 2023-04-09 ENCOUNTER — Inpatient Hospital Stay: Payer: Medicare (Managed Care)

## 2023-04-09 ENCOUNTER — Inpatient Hospital Stay (HOSPITAL_COMMUNITY)
Admit: 2023-04-09 | Discharge: 2023-04-09 | Disposition: A | Discharge status: Home / Self Care | Payer: Medicare (Managed Care) | Attending: Osteopathic Medicine | Admitting: Osteopathic Medicine

## 2023-04-09 DIAGNOSIS — I482 Chronic atrial fibrillation, unspecified: Secondary | ICD-10-CM

## 2023-04-09 DIAGNOSIS — R0609 Other forms of dyspnea: Secondary | ICD-10-CM | POA: Diagnosis not present

## 2023-04-09 DIAGNOSIS — R0602 Shortness of breath: Secondary | ICD-10-CM | POA: Diagnosis not present

## 2023-04-09 DIAGNOSIS — E039 Hypothyroidism, unspecified: Secondary | ICD-10-CM | POA: Diagnosis not present

## 2023-04-09 DIAGNOSIS — J90 Pleural effusion, not elsewhere classified: Secondary | ICD-10-CM

## 2023-04-09 LAB — CBC
HCT: 40.8 % (ref 36.0–46.0)
Hemoglobin: 13.1 g/dL (ref 12.0–15.0)
MCH: 30.5 pg (ref 26.0–34.0)
MCHC: 32.1 g/dL (ref 30.0–36.0)
MCV: 95.1 fL (ref 80.0–100.0)
Platelets: 209 10*3/uL (ref 150–400)
RBC: 4.29 MIL/uL (ref 3.87–5.11)
RDW: 13.7 % (ref 11.5–15.5)
WBC: 8.9 10*3/uL (ref 4.0–10.5)
nRBC: 0 % (ref 0.0–0.2)

## 2023-04-09 LAB — GLUCOSE, PLEURAL OR PERITONEAL FLUID: Glucose, Fluid: 36 mg/dL

## 2023-04-09 LAB — BODY FLUID CELL COUNT WITH DIFFERENTIAL
Eos, Fluid: 0 %
Lymphs, Fluid: 13 %
Monocyte-Macrophage-Serous Fluid: 87 %
Neutrophil Count, Fluid: 0 %
Total Nucleated Cell Count, Fluid: 22066 uL

## 2023-04-09 LAB — GLUCOSE, CAPILLARY: Glucose-Capillary: 128 mg/dL — ABNORMAL HIGH (ref 70–99)

## 2023-04-09 LAB — HEPARIN LEVEL (UNFRACTIONATED): Heparin Unfractionated: >1.1 [IU]/mL — ABNORMAL HIGH (ref 0.30–0.70)

## 2023-04-09 LAB — PROTEIN, PLEURAL OR PERITONEAL FLUID: Total protein, fluid: 5.3 g/dL

## 2023-04-09 LAB — AMYLASE, PLEURAL OR PERITONEAL FLUID: Amylase, Fluid: 30 U/L

## 2023-04-09 LAB — APTT: aPTT: 60 s — ABNORMAL HIGH (ref 24–36)

## 2023-04-09 LAB — LACTATE DEHYDROGENASE, PLEURAL OR PERITONEAL FLUID: LD, Fluid: 1546 U/L — ABNORMAL HIGH (ref 3–23)

## 2023-04-09 MED ORDER — HEPARIN BOLUS VIA INFUSION
900.0000 [IU] | Freq: Once | INTRAVENOUS | Status: AC
  Administered 2023-04-09: 900 [IU] via INTRAVENOUS
  Filled 2023-04-09: qty 900

## 2023-04-09 MED ORDER — HEPARIN (PORCINE) 25000 UT/250ML-% IV SOLN
1000.0000 [IU]/h | INTRAVENOUS | Status: DC
  Administered 2023-04-09: 1000 [IU]/h via INTRAVENOUS

## 2023-04-09 NOTE — Plan of Care (Signed)
  Problem: Education: Goal: Knowledge of General Education information will improve Description: Including pain rating scale, medication(s)/side effects and non-pharmacologic comfort measures Outcome: Progressing   Problem: Health Behavior/Discharge Planning: Goal: Ability to manage health-related needs will improve Outcome: Progressing   Problem: Clinical Measurements: Goal: Ability to maintain clinical measurements within normal limits will improve Outcome: Progressing Goal: Will remain free from infection Outcome: Progressing Goal: Diagnostic test results will improve Outcome: Progressing Goal: Respiratory complications will improve Outcome: Progressing Pt had bedside Thoracentesis performed by Dr Silverio Decamp this shift; removing 1.5 liters of csf. Goal: Cardiovascular complication will be avoided Outcome: Progressing   Problem: Activity: Goal: Risk for activity intolerance will decrease Outcome: Progressing   Problem: Nutrition: Goal: Adequate nutrition will be maintained Outcome: Progressing   Problem: Coping: Goal: Level of anxiety will decrease Outcome: Progressing   Problem: Elimination: Goal: Will not experience complications related to bowel motility Outcome: Progressing Goal: Will not experience complications related to urinary retention Outcome: Progressing   Problem: Pain Management: Goal: General experience of comfort will improve Outcome: Progressing   Problem: Safety: Goal: Ability to remain free from injury will improve Outcome: Progressing   Problem: Skin Integrity: Goal: Risk for impaired skin integrity will decrease Outcome: Progressing

## 2023-04-09 NOTE — Progress Notes (Addendum)
PROGRESS NOTE    [name]   AVW:[id number] DOB: 1945/07/04  DOA: 04/07/2023 Date of Service: 04/09/23 which is hospital day 2  PCP: Pcp, No    HPI:  [name] is a 77 y.o. female with medical history significant of a fib and DVT on Eliquis, HTN, diet-controlled diabetes, COPD on 2 L oxygen at night, hypothyroidism, GERD, depression, anemia, CKD-3A, who presents with shortness of breath x several days progressively worsening.  Cough with little mucus production, chest pressure.PCP at [facility] - CT of chest done, moderate layered right pleural effusion.  Patient sent to ED  Hospital course / significant events:  11/01: to ED, admitted to hospitalist service. Thoracentesis pending.  11/02: reports persistent chest tightness and SOB, thoracentesis pending 11/03: thoracentesis today fluid labs pending, echo pending. Wait 24h at least (utnil tomorrow approx 4:00 PM) prior to restart anticoagulation   Consultants:  none  Procedures/Surgeries: 04/09/23 thoracentesis w/ Dr Aundria Rud      ASSESSMENT & PLAN:   Pleural effusion on right, uncertain etiology  Patient does not have fever, but has leukocytosis with WBC 14.3.   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   CBG every morning   COPD (chronic obstructive pulmonary disease) (HCC) Bronchodilators, as needed Mucinex   Hypothyroidism Synthroid   Chronic kidney disease, stage 3a (HCC):  Renal function stable. Monitor BMP   Atrial fibrillation, chronic  Metoprolol, amiodarone Eliquis to IV heparin pending thoracentesis   Hx DVT (deep venous thrombosis)  Heparin as above Plan  eliquis on discharge / when able    Right foot pain: no fracture Pain control PT/OT following thoracentesis   Chest tightness Anxiety component  Reassurance that she is on heart monitor and oxygen monitor EKG for completeness  Continue telemetry and continuous O2  Hold heavily sedating medications w/ risk respiratory compromise but will trial low dose Xanax and will trial trazodone prn insmonia     Overweight based on BMI: Body mass index is 26.83 kg/m.  Underweight - under 18.5  normal weight - 18.5 to 24.9 overweight - 25 to 29.9 obese - 30 or more   DVT prophylaxis: on heparin  IV fluids: no continuous IV fluids  Nutrition: cardiac/carb diet  Central lines / invasive devices: none  Code Status: FULL CODE ACP documentation reviewed: has MOST on file in [facility]  TOC needs: TBD Barriers to dispo / significant pending items: thoracentesis, workup for effusion              Subjective / Brief ROS:  Patient reports chest pressure and SOB  No chest pain/sharp pain Denies new weakness.  Tolerating diet.  Reports no concerns w/ urination/defecation.   Family Communication: will call son today following thoracentesis     Objective Findings:  Vitals:   04/09/23 0517 04/09/23 0757 04/09/23 0802 04/09/23 1148  BP: 103/63  (!) 88/52 108/66  Pulse: 60 60 60 62  Resp: 20 17  16   Temp: 98.1 F (36.7 C) 98 F (36.7 C)  97.6 F (36.4 C)  TempSrc:      SpO2: 95% 96%  98%  Weight:      Height:  Intake/Output Summary (Last 24 hours) at 04/09/2023 1546 Last data filed at 04/09/2023 1030 Gross per 24 hour  Intake 131.62 ml  Output --  Net 131.62 ml   Filed Weights   04/07/23 1705 04/08/23 1727  Weight: 64.4 kg 64.2 kg    Examination:  Physical Exam Constitutional:      General: She is not in acute distress.    Appearance: She is not toxic-appearing.  Cardiovascular:     Rate and Rhythm: Normal rate. Rhythm irregular.     Heart sounds: Murmur  heard.  Pulmonary:     Breath sounds: Examination of the right-middle field reveals decreased breath sounds. Examination of the right-lower field reveals decreased breath sounds. Decreased breath sounds present.  Abdominal:     Palpations: Abdomen is soft.  Musculoskeletal:     Right lower leg: No edema.     Left lower leg: No edema.  Skin:    General: Skin is warm and dry.  Neurological:     General: No focal deficit present.     Mental Status: She is alert.  Psychiatric:        Mood and Affect: Mood is anxious.        Behavior: Behavior normal.          Scheduled Medications:   amiodarone  200 mg Oral Daily   enalapril  20 mg Oral Daily   ferrous sulfate  325 mg Oral Daily   fluticasone furoate-vilanterol  1 puff Inhalation Daily   furosemide  20 mg Oral Daily   levothyroxine  125 mcg Oral Q0600   loratadine  10 mg Oral Daily   metoprolol tartrate  25 mg Oral BID   pantoprazole  40 mg Oral Daily   potassium chloride  10 mEq Oral Daily   rOPINIRole  0.25 mg Oral BID    Continuous Infusions:    PRN Medications:  acetaminophen, albuterol, ALPRAZolam, dextromethorphan-guaiFENesin, diphenhydrAMINE, fluticasone, hydrALAZINE, ondansetron (ZOFRAN) IV, polyvinyl alcohol, sodium chloride, traZODone  Antimicrobials from admission:  Anti-infectives (From admission, onward)    Start     Dose/Rate Route Frequency Ordered Stop   04/08/23 2200  vancomycin (VANCOREADY) IVPB 750 mg/150 mL  Status:  Discontinued        750 mg 150 mL/hr over 60 Minutes Intravenous Every 24 hours 04/07/23 2134 04/09/23 1210   04/07/23 2200  ceFEPIme (MAXIPIME) 2 g in sodium chloride 0.9 % 100 mL IVPB  Status:  Discontinued        2 g 200 mL/hr over 30 Minutes Intravenous Every 12 hours 04/07/23 2131 04/09/23 1210   04/07/23 2145  vancomycin (VANCOREADY) IVPB 1250 mg/250 mL        1,250 mg 166.7 mL/hr over 90 Minutes Intravenous  Once 04/07/23 2131 04/08/23 0001           Data Reviewed:   I have personally reviewed the following...  CBC: Recent Labs  Lab 04/07/23 1806 04/08/23 0627 04/09/23 0459  WBC 14.3* 11.2* 8.9  NEUTROABS 10.7*  --   --   HGB 13.7 12.8 13.1  HCT 40.2 38.6 40.8  MCV 90.5 93.2 95.1  PLT 240 205 209   Basic Metabolic Panel: Recent Labs  Lab 04/07/23 1806 04/08/23 0627  NA 136 136  K 4.2 4.4  CL 102 103  CO2 23 23  GLUCOSE 111* 106*  BUN 33* 36*  CREATININE 1.02* 1.07*  CALCIUM 9.1 8.6*   GFR: Estimated Creatinine Clearance: 38.1 mL/min (A) (by C-G formula based on SCr  of 1.07 mg/dL (H)). Liver Function Tests: Recent Labs  Lab 04/08/23 0627  AST 31  ALT 14  ALKPHOS 34*  BILITOT 1.2  PROT 6.8  ALBUMIN 3.5   No results for input(s): "LIPASE", "AMYLASE" in the last 168 hours. No results for input(s): "AMMONIA" in the last 168 hours. Coagulation Profile: Recent Labs  Lab 04/07/23 2313  INR 1.7*   Cardiac Enzymes: No results for input(s): "CKTOTAL", "CKMB", "CKMBINDEX", "TROPONINI" in the last 168 hours. BNP (last 3 results) No results for input(s): "PROBNP" in the last 8760 hours. HbA1C: No results for input(s): "HGBA1C" in the last 72 hours. CBG: Recent Labs  Lab 04/09/23 0802  GLUCAP 128*   Lipid Profile: No results for input(s): "CHOL", "HDL", "LDLCALC", "TRIG", "CHOLHDL", "LDLDIRECT" in the last 72 hours. Thyroid Function Tests: No results for input(s): "TSH", "T4TOTAL", "FREET4", "T3FREE", "THYROIDAB" in the last 72 hours. Anemia Panel: No results for input(s): "VITAMINB12", "FOLATE", "FERRITIN", "TIBC", "IRON", "RETICCTPCT" in the last 72 hours. Most Recent Urinalysis On File:     Component Value Date/Time   COLORURINE STRAW (A) 03/02/2022 1117   APPEARANCEUR CLEAR (A) 03/02/2022 1117   LABSPEC 1.008 03/02/2022 1117   PHURINE 5.0 03/02/2022 1117   GLUCOSEU NEGATIVE 03/02/2022 1117   HGBUR SMALL (A) 03/02/2022 1117   BILIRUBINUR NEGATIVE 03/02/2022 1117   KETONESUR NEGATIVE 03/02/2022 1117   PROTEINUR  NEGATIVE 03/02/2022 1117   NITRITE NEGATIVE 03/02/2022 1117   LEUKOCYTESUR NEGATIVE 03/02/2022 1117   Sepsis Labs: @LABRCNTIP (procalcitonin:4,lacticidven:4) Microbiology: Recent Results (from the past 240 hour(s))  Culture, blood (Routine X 2) w Reflex to ID Panel     Status: None (Preliminary result)   Collection Time: 04/07/23 11:13 PM   Specimen: BLOOD RIGHT ARM  Result Value Ref Range Status   Specimen Description BLOOD RIGHT ARM  Final   Special Requests   Final    BOTTLES DRAWN AEROBIC AND ANAEROBIC Blood Culture adequate volume   Culture   Final    NO GROWTH 2 DAYS Performed at [facility], [address] [place], [place] [address]    Report Status PENDING  Incomplete  Culture, blood (Routine X 2) w Reflex to ID Panel     Status: None (Preliminary result)   Collection Time: 04/07/23 11:14 PM   Specimen: BLOOD RIGHT HAND  Result Value Ref Range Status   Specimen Description BLOOD RIGHT HAND  Final   Special Requests   Final    BOTTLES DRAWN AEROBIC AND ANAEROBIC Blood Culture adequate volume   Culture   Final    NO GROWTH 2 DAYS Performed at [facility], [address] [place], [place] [address]    Report Status PENDING  Incomplete      Radiology Studies last 3 days: US Abdomen Limited RUQ (LIVER/GB)  Result Date: 04/09/2023 CLINICAL DATA:  Cirrhosis EXAM: ULTRASOUND ABDOMEN LIMITED RIGHT UPPER QUADRANT COMPARISON:  None Available. FINDINGS: Gallbladder: Prior cholecystectomy. Common bile duct: Diameter: 6 mm. Liver: No focal lesion identified. Nodular liver contour. Normal parenchymal echogenicity. Portal vein is patent on color Doppler imaging with normal direction of blood flow towards the liver. Other: Right pleural effusion. IMPRESSION: 1. Cirrhotic liver morphology. 2. Right pleural effusion. Electronically Signed   By: Allegra Lai M.D.   On: 04/09/2023 12:59   DG Foot Complete Right  Result Date: 04/08/2023 CLINICAL DATA:  Right foot  pain EXAM: RIGHT FOOT COMPLETE - 3+ VIEW COMPARISON:  None Available. FINDINGS: No fracture or dislocation is seen. Mild degenerative changes  of the 1st MCP joint. Degenerative changes of the dorsal midfoot. Moderate posterior and plantar enthesophytes. IMPRESSION: No fracture or dislocation is seen. Degenerative changes, as above. Electronically Signed   By: Charline Bills M.D.   On: 04/08/2023 01:55   CT CHEST WO CONTRAST  Result Date: 04/07/2023 CLINICAL DATA:  Chest pain, shortness of breath EXAM: CT CHEST WITHOUT CONTRAST TECHNIQUE: Multidetector CT imaging of the chest was performed following the standard protocol without IV contrast. RADIATION DOSE REDUCTION: This exam was performed according to the departmental dose-optimization program which includes automated exposure control, adjustment of the mA and/or kV according to patient size and/or use of iterative reconstruction technique. COMPARISON:  CT 05/23/2022 FINDINGS: Cardiovascular: Heart size is upper limits of normal. No pericardial effusion. Thoracic aorta is nonaneurysmal. There are atherosclerotic vascular calcifications of the aorta and coronary arteries. Calcification of the mitral annulus. Central pulmonary vasculature is nondilated. Mediastinum/Nodes: No enlarged mediastinal or axillary lymph nodes. Thyroid gland, trachea, and esophagus demonstrate no significant findings. Small hiatal hernia. Lungs/Pleura: Moderate-large layering right-sided pleural effusion with complete atelectasis of the right lower lobe and near-complete atelectasis of the right middle lobe. Partial compressive atelectasis of the right upper lobe. Mild mosaic attenuation of the aerated portion of the right upper lobe and within the left lung. No left-sided pleural effusion. No pneumothorax. Upper Abdomen: Nodular hepatic surface contour. No acute abnormality. Musculoskeletal: No chest wall mass or suspicious bone lesions identified. Left chest wall loop recorder.  IMPRESSION: 1. Moderate-large layering right-sided pleural effusion with complete atelectasis of the right lower lobe and near-complete atelectasis of the right middle lobe. Partial compressive atelectasis of the right upper lobe. 2. Mild mosaic attenuation of the aerated portion of the right upper lobe and within the left lung, which may be related to small airways disease. 3. Nodular hepatic surface contour, suggestive of cirrhosis. 4. Aortic and coronary artery atherosclerosis (ICD10-I70.0). Electronically Signed   By: Duanne Guess D.O.   On: 04/07/2023 15:25         Sunnie Nielsen, DO [facility] 04/09/2023, 3:46 PM    Dictation software may have been used to generate the above note. Typos may occur and escape review in typed/dictated notes. Please contact Dr Lyn Hollingshead directly for clarity if needed.  Staff may message me via secure chat in Epic  but this may not receive an immediate response,  please page me for urgent matters!  If 7PM-7AM, please contact night coverage [email or web address]

## 2023-04-09 NOTE — Progress Notes (Signed)
Brief Progress Note:  Reviewed patient's pleural fluid analysis, with leukocytosis that is monocyte predominant, and significant elevation in LDH. My differential for this includes infectious etiologies, especially TB, as well as malignancy. Cytology and AFB cultures were sent. She is hemodynamically stable, and the effusion is simple appearing on [place]. She has no signs or symptoms of infection otherwise, and I am less concerned about an empyema at this point. Will hold off on chest tube placement at this point.  [name], MD [facility] 04/09/2023 6:05 PM

## 2023-04-09 NOTE — Plan of Care (Signed)

## 2023-04-09 NOTE — Progress Notes (Signed)
ANTICOAGULATION CONSULT NOTE  Pharmacy Consult for Heparin Infusion Indication: AF  Allergies  Allergen Reactions   Cephalosporins Itching   Codeine Other (See Comments)    Extreme drowsiness   Contrast Media [Iodinated Contrast Media] Hives and Swelling    Facial swelling   Oxycodone Other (See Comments)    Extreme drowsiness    Patient Measurements: Height: 5\' 2"  (157.5 cm) Weight: 64.2 kg (141 lb 8.6 oz) IBW/kg (Calculated) : 50.1 Heparin Dosing Weight: 61.1 kg  Vital Signs: Temp: 98.1 F (36.7 C) (11/03 0517) BP: 103/63 (11/03 0517) Pulse Rate: 60 (11/03 0517)  Labs: Recent Labs    04/07/23 1806 04/07/23 1806 04/07/23 2313 04/08/23 0627 04/08/23 1029 04/08/23 2009 04/09/23 0459  HGB 13.7  --   --  12.8  --   --  13.1  HCT 40.2  --   --  38.6  --   --  40.8  PLT 240  --   --  205  --   --  209  APTT  --    < > 42*  --  120* 97* 60*  LABPROT  --   --  19.8*  --   --   --   --   INR  --   --  1.7*  --   --   --   --   HEPARINUNFRC  --   --   --   --  >1.10*  --  >1.10*  CREATININE 1.02*  --   --  1.07*  --   --   --    < > = values in this interval not displayed.    Estimated Creatinine Clearance: 38.1 mL/min (A) (by C-G formula based on SCr of 1.07 mg/dL (H)).   Medical History: Past Medical History:  Diagnosis Date   Anemia    Asthma    COPD (chronic obstructive pulmonary disease) (HCC)    Diabetes mellitus without complication (HCC)    type 2   DVT (deep venous thrombosis) (HCC) 1980   GERD (gastroesophageal reflux disease)    History of hiatal hernia    Hypertension    Hypothyroidism     Assessment: [name] is a 77 y.o. female presenting after abnormal CT as outpatient. PMH significant for HTN, COPD, hypothyroidism, T2DM, AF, IDA. Patient was on [facility] PTA per chart review. Last dose of apixaban was 10/31. Pharmacy has been consulted to initiate and manage heparin infusion.   Baseline Labs: aPTT 42, PT 19.8, INR 1.7, Hgb 14.3, Hct 40.2,  Plt 240   Goal of Therapy:  Heparin level 0.3-0.7 units/ml aPTT 66-102 seconds Monitor platelets by anticoagulation protocol: Yes  Date Time aPTT/HL Rate/Comment  11/2 1029 120/>1.10 1000/supratherapeutic 11/2 2009 97/---  900/therapeutic x1 11/03 0459 60 / >1.1 aPTT subtherapeutic  Plan: Bolus 900 units x 1 Increase heparin infusion to 1000 units/hr Check aPTT in 8 hours after rate change Continue to monitor aPTT until HL and aPTT correlate.  Check HL daily for correlation until HL and aPTT correlate. Switch to HL monitoring once HL and aPTT correlate.  Continue to monitor H&H and platelets daily while on heparin infusion    Otelia Sergeant, PharmD, [facility] 04/09/2023 6:22 AM

## 2023-04-09 NOTE — Procedures (Signed)
Thoracentesis  Procedure Note  [name]  [id number]  1946/03/17  Date:04/09/23  Time:3:35 PM   Provider Performing:Bellamy Judson   Procedure: Thoracentesis with imaging guidance ([id number])  Indication(s) Pleural Effusion  Consent Risks of the procedure as well as the alternatives and risks of each were explained to the patient and/or caregiver.  Consent for the procedure was obtained and is signed in the bedside chart  Anesthesia Topical only with 1% lidocaine    Time Out Verified patient identification, verified procedure, site/side was marked, verified correct patient position, special equipment/implants available, medications/allergies/relevant history reviewed, required imaging and test results available.   Sterile Technique Maximal sterile technique including full sterile barrier drape, hand hygiene, sterile gown, sterile gloves, mask, hair covering, sterile ultrasound probe cover (if used).  Procedure Description Ultrasound was used to identify appropriate pleural anatomy for placement and overlying skin marked.  Area of drainage cleaned and draped in sterile fashion. Lidocaine was used to anesthetize the skin and subcutaneous tissue. The 23 gauge introducer needle aspirated blood on first pass. At this point, the introducer needle was withdrawn, and a chest tube kit was obtained should the effusion be a hemothorax. With real time ultrasound guidance, a different introducer needle was introduced one rib space above - serosanguinous fluid was aspirated. At this point, I decided to proceed with the thoracentesis and not place a chest tube. 1500 cc's of serosanguinous appearing fluid was drained from the right pleural space. Catheter then removed and bandaid applied to site.   Complications/Tolerance None; patient tolerated the procedure well. Chest X-ray is ordered to confirm no post-procedural complication.   EBL Minimal   Specimen(s) Pleural fluid   [name],  MD [place] [facility] 04/09/2023 3:35 PM

## 2023-04-09 NOTE — Progress Notes (Signed)
*  PRELIMINARY RESULTS* Echocardiogram 2D Echocardiogram has been performed.  [name] 04/09/2023, 2:36 PM

## 2023-04-09 NOTE — Consult Note (Signed)
NAME:  [name], MRN:  [id number], DOB:  10/03/1945, LOS: 2 ADMISSION DATE:  04/07/2023, CONSULTATION DATE:  04/09/2023 REFERRING MD:  Sunnie Nielsen, DO, CHIEF COMPLAINT:  Pleural Effusion   History of Present Illness:   Patient is a 77 year old female with presenting to the hospital with shortness of breath and found to have a pleural effusion for which we are consulted.  She reports shortness of breath of a few weeks that progressively got worse. Outpatient chest xray showed a pleural effusion for which she was asked to come in to the ED. No infectious symptoms reported, with no shortness of breath, no cough, and no sputum production. She has not had a pleural effusion in the past.  She reports history of TB exposure in the past (husband died of TB in the 79's). Denies any lower extremity swelling. Denies any knowledge of liver cirrhosis. Has not had any pleural effusions or pneumonia in the past. She denies any jaundice. She reports being on furosemide, with the dose halved around 6 to 8 months ago. She didn't notice an association between that and her symptoms.  Workup in the ED included a CBC showing mild leukocytosis (14.3) and stable renal function. Procalcitonin was < 0.1. CT scan of the chest showed a large right sided pleural effusion with collapse of the RLL and RML. The liver is noted to be nodular suggestive of cirrhosis. She was started on antibiotics and admitted to the hospital for further workup.  Patient adamantly denies any history of smoking or alcohol use. Reports second hand smoke exposure from her husband.  Pertinent  Medical History  -afib on Apixaban -history of DVT -HTN -Asthma -Hypothyroidism -CKD   Objective   Blood pressure (!) 88/52, pulse 60, temperature 98 F (36.7 C), resp. rate 17, height 5\' 2"  (1.575 m), weight 64.2 kg, SpO2 96%.       No intake or output data in the 24 hours ending 04/09/23 0930 Filed Weights   04/07/23 1705 04/08/23 1727   Weight: 64.4 kg 64.2 kg    Examination: Physical Exam Constitutional:      General: She is not in acute distress.    Appearance: Normal appearance. She is ill-appearing.  Cardiovascular:     Rate and Rhythm: Normal rate and regular rhythm.     Pulses: Normal pulses.     Heart sounds: Normal heart sounds.  Pulmonary:     Effort: Pulmonary effort is normal.     Comments: Decreased breath sounds over the right Abdominal:     General: There is distension.     Palpations: Abdomen is soft.  Musculoskeletal:     Right lower leg: No edema.     Left lower leg: No edema.  Neurological:     General: No focal deficit present.     Mental Status: She is alert and oriented to person, place, and time. Mental status is at baseline.     Assessment & Plan:   #Pleural Effusion  Presenting with new onset pleural effusion of unclear etiology. Broad differential that includes infectious (including TB), para-pmneumonic effusions, hepatic hydrothorax, decompensated heart failure, medication induced (amiodarone), hemothorax, chylothorax, collagen vascular disease, and malignancy. On POCUS, the fluid appears simple and free flowing. Patient will need thoracentesis and to send fluid for analysis to determine the etiology of the effusion. Will limit to 1.5L of drainage today, and if this is an exudate she might require a chest tube for further drainage.  -thoracentesis today -will send pleural  fluid analysis -recommend TTE -recommend abdominal US -liver cirrhosis workup -PCCM to follow  [name], MD [facility] 04/09/2023 11:31 AM     Labs   CBC: Recent Labs  Lab 04/07/23 1806 04/08/23 0627 04/09/23 0459  WBC 14.3* 11.2* 8.9  NEUTROABS 10.7*  --   --   HGB 13.7 12.8 13.1  HCT 40.2 38.6 40.8  MCV 90.5 93.2 95.1  PLT 240 205 209    Basic Metabolic Panel: Recent Labs  Lab 04/07/23 1806 04/08/23 0627  NA 136 136  K 4.2 4.4  CL 102 103  CO2 23 23  GLUCOSE  111* 106*  BUN 33* 36*  CREATININE 1.02* 1.07*  CALCIUM 9.1 8.6*   GFR: Estimated Creatinine Clearance: 38.1 mL/min (A) (by C-G formula based on SCr of 1.07 mg/dL (H)). Recent Labs  Lab 04/07/23 1806 04/08/23 0627 04/09/23 0459  PROCALCITON <0.10  --   --   WBC 14.3* 11.2* 8.9    Liver Function Tests: Recent Labs  Lab 04/08/23 0627  AST 31  ALT 14  ALKPHOS 34*  BILITOT 1.2  PROT 6.8  ALBUMIN 3.5   No results for input(s): "LIPASE", "AMYLASE" in the last 168 hours. No results for input(s): "AMMONIA" in the last 168 hours.  ABG No results found for: "PHART", "PCO2ART", "PO2ART", "HCO3", "TCO2", "ACIDBASEDEF", "O2SAT"   Coagulation Profile: Recent Labs  Lab 04/07/23 2313  INR 1.7*    Cardiac Enzymes: No results for input(s): "CKTOTAL", "CKMB", "CKMBINDEX", "TROPONINI" in the last 168 hours.  HbA1C: Hgb A1c MFr Bld  Date/Time Value Ref Range Status  05/22/2022 06:31 PM 7.4 (H) 4.8 - 5.6 % Final    Comment:    (NOTE)         Prediabetes: 5.7 - 6.4         Diabetes: >6.4         Glycemic control for adults with diabetes: <7.0     CBG: Recent Labs  Lab 04/09/23 0802  GLUCAP 128*    Review of Systems:   Review of Systems  Constitutional:  Positive for malaise/fatigue. Negative for chills, fever and weight loss.  Respiratory:  Positive for shortness of breath. Negative for cough, hemoptysis, sputum production and wheezing.   Cardiovascular:  Negative for chest pain.     Past Medical History:  She,  has a past medical history of Anemia, Asthma, COPD (chronic obstructive pulmonary disease) (HCC), Diabetes mellitus without complication (HCC), DVT (deep venous thrombosis) (HCC) (1980), GERD (gastroesophageal reflux disease), History of hiatal hernia, Hypertension, and Hypothyroidism.   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 She has never used smokeless tobacco. She reports that she does not drink alcohol and does not use drugs.   Family History:  Her family history includes Kidney disease in her mother.   Allergies Allergies  Allergen Reactions   Cephalosporins Itching   Codeine Other (See Comments)    Extreme drowsiness   Contrast Media [Iodinated Contrast Media] Hives and Swelling    Facial swelling   Oxycodone Other (See Comments)    Extreme drowsiness     Home Medications  Prior to Admission medications   Medication Sig Start Date End Date Taking? Authorizing Provider  acetaminophen (TYLENOL) 325 MG tablet Take 1,300 mg by mouth every 6 (six) hours as needed for moderate pain.   Yes [provider]  ADVAIR HFA 115-21 MCG/ACT inhaler Inhale 2 puffs into the lungs 2 (two) times daily. 04/07/23  Yes [provider]  albuterol (PROVENTIL) (2.5 MG/3ML) 0.083% nebulizer solution Take 2.5 mg by nebulization every 4 (four) hours as needed for wheezing or shortness of breath.   Yes [provider]  albuterol (VENTOLIN HFA) 108 (90 Base) MCG/ACT inhaler Inhale 2-4 puffs by  mouth every 4 hours as needed for wheezing, cough, and/or shortness of breath 11/28/20  Yes Loleta Rose, MD  amiodarone (PACERONE) 200 MG tablet Take 1 tablet (200 mg total) by mouth 2 (two) times daily for 4 days, THEN 1 tablet (200 mg total) daily. 05/24/22 04/08/23 Yes Pennie Banter, DO  apixaban (ELIQUIS) 5 MG TABS tablet Take 1 tablet (5 mg total) by mouth 2 (two) times daily. 05/24/22  Yes Pennie Banter, DO  Ascorbic Acid (VITAMIN C CR) 1500 MG TBCR Take 1 tablet by mouth daily.   Yes [provider]  atorvastatin (LIPITOR) 20 MG tablet Take 20 mg by mouth daily.   Yes [provider]  busPIRone (BUSPAR) 5 MG tablet Take 5 mg by mouth 3 (three) times daily as needed.   Yes [provider]  Carboxymeth-Glycerin-Polysorb (REFRESH OPTIVE ADVANCED) 0.5-1-0.5 % SOLN Apply 2 drops to eye daily.   Yes [provider]  Emollient (EUCERIN) lotion Apply 1 Application topically as needed for dry skin.   Yes [provider]  enalapril (VASOTEC) 20 MG tablet Take 20 mg by mouth daily.   Yes [provider]  esomeprazole (NEXIUM) 40 MG capsule Take 40 mg by mouth 2 (two) times daily. 05/06/22  Yes [provider]  ferrous sulfate 325 (65 FE) MG tablet Take 325 mg by mouth daily.   Yes [provider]  fluticasone (FLONASE) 50 MCG/ACT nasal spray Place 2 sprays into both nostrils daily as needed for allergies or rhinitis.   Yes [provider]  furosemide (LASIX) 20 MG tablet Take 20 mg by mouth daily.   Yes [provider]  ibuprofen (ADVIL) 200 MG tablet Take 400 mg by mouth every 6 (six) hours as needed for mild pain.   Yes [provider]  ipratropium-albuterol (DUONEB) 0.5-2.5 (3) MG/3ML SOLN Take 3 mLs by nebulization every 4 (four) hours as needed. 02/14/23  Yes [provider]  ketoconazole (NIZORAL) 2 % shampoo Apply 1 Application topically 2 (two) times a week.   Yes [provider]  ketotifen (ZADITOR) 0.025 % ophthalmic solution Place 1 drop into both eyes daily as needed.   Yes [provider]  levothyroxine (SYNTHROID) 125 MCG tablet  Take 125 mcg by mouth daily before breakfast.   Yes [provider]  loperamide (IMODIUM) 2 MG capsule Take 2 mg by mouth as needed for diarrhea or loose stools. 11/29/22  Yes [provider]  loratadine (CLARITIN) 10 MG tablet Take 1 tablet (10 mg total) by mouth daily. 05/25/22  Yes Esaw Grandchild A, DO  Magnesium 250 MG TABS Take 1 tablet by mouth daily.   Yes [provider]  metoprolol tartrate (LOPRESSOR) 25 MG tablet Take 1 tablet (25 mg total) by mouth 2 (two) times daily. 05/24/22  Yes Esaw Grandchild A, DO  MUCINEX 600 MG 12 hr tablet Take 1 tablet (600 mg total) by mouth 2 (two) times daily as needed. 05/24/22  Yes Esaw Grandchild A, DO  Omega-3 1000 MG CAPS Take 1 capsule by mouth daily.   Yes [provider]  potassium chloride (KLOR-CON M) 10 MEQ tablet Take 10 mEq by mouth daily.   Yes [provider]  potassium chloride (KLOR-CON) 10 MEQ tablet Take 10 mEq by mouth 2 (two) times daily. 04/07/23  Yes [provider]  rOPINIRole (REQUIP) 0.25 MG tablet Take 0.25 mg by mouth 2 (two) times daily. 03/30/23  Yes [provider]  sodium chloride (OCEAN) 0.65 % SOLN nasal spray Place 1 spray into both nostrils as needed for congestion. 05/24/22  Yes Esaw Grandchild A, DO  SYSTANE ULTRA 0.4-0.3 % SOLN Apply to eye. 04/07/23  Yes [provider]  traMADol (ULTRAM) 50 MG tablet Take 50 mg by mouth every 6 (six) hours as needed. 03/09/23  Yes [provider]  traZODone (DESYREL) 50 MG tablet Take 50 mg by mouth at bedtime as needed for sleep. 03/14/23  Yes [provider]  TURMERIC PO Take 1 capsule by mouth daily.   Yes [provider]  VITAMIN D-VITAMIN K PO Take 4,000 Units by mouth daily.   Yes [provider]   amoxicillin (AMOXIL) 500 MG capsule Take 500 mg by mouth 3 (three) times daily. Patient not taking: Reported on 04/08/2023 05/16/22   [provider]  OXYGEN Inhale 2 L into the lungs at bedtime.    [provider]    I spent 66 minutes caring for this patient today, including preparing to see the patient, obtaining a medical history , reviewing a separately obtained history, performing a medically appropriate examination and/or evaluation, counseling and educating the patient/family/caregiver, ordering medications, tests, or procedures, documenting clinical information in the electronic health record, and independently interpreting results (not separately reported/billed) and communicating results to the patient/family/caregiver

## 2023-04-10 ENCOUNTER — Inpatient Hospital Stay: Payer: Medicare (Managed Care)

## 2023-04-10 DIAGNOSIS — J90 Pleural effusion, not elsewhere classified: Secondary | ICD-10-CM | POA: Diagnosis not present

## 2023-04-10 LAB — BASIC METABOLIC PANEL
Anion gap: 9 (ref 5–15)
BUN: 40 mg/dL — ABNORMAL HIGH (ref 8–23)
CO2: 27 mmol/L (ref 22–32)
Calcium: 8.8 mg/dL — ABNORMAL LOW (ref 8.9–10.3)
Chloride: 100 mmol/L (ref 98–111)
Creatinine, Ser: 1.13 mg/dL — ABNORMAL HIGH (ref 0.44–1.00)
GFR, Estimated: 50 mL/min — ABNORMAL LOW (ref >60)
Glucose, Bld: 121 mg/dL — ABNORMAL HIGH (ref 70–99)
Potassium: 4.1 mmol/L (ref 3.5–5.1)
Sodium: 136 mmol/L (ref 135–145)

## 2023-04-10 LAB — CBC
HCT: 41 % (ref 36.0–46.0)
Hemoglobin: 13.4 g/dL (ref 12.0–15.0)
MCH: 30.2 pg (ref 26.0–34.0)
MCHC: 32.7 g/dL (ref 30.0–36.0)
MCV: 92.3 fL (ref 80.0–100.0)
Platelets: 175 10*3/uL (ref 150–400)
RBC: 4.44 MIL/uL (ref 3.87–5.11)
RDW: 13.3 % (ref 11.5–15.5)
WBC: 8.1 10*3/uL (ref 4.0–10.5)
nRBC: 0 % (ref 0.0–0.2)

## 2023-04-10 LAB — ECHOCARDIOGRAM COMPLETE
AR max vel: 1.37 cm2
AV Area VTI: 1.18 cm2
AV Area mean vel: 1.26 cm2
AV Mean grad: 7 mm[Hg]
AV Peak grad: 12.7 mm[Hg]
Ao pk vel: 1.78 m/s
Area-P 1/2: 2.07 cm2
Height: 62 in
MV M vel: 1.72 m/s
MV Peak grad: 11.8 mm[Hg]
P 1/2 time: 600 ms
S' Lateral: 3.1 cm
Weight: 2264.57 [oz_av]

## 2023-04-10 MED ORDER — HEPARIN SODIUM (PORCINE) 5000 UNIT/ML IJ SOLN
5000.0000 [IU] | Freq: Three times a day (TID) | INTRAMUSCULAR | Status: DC
  Administered 2023-04-10 – 2023-04-12 (×6): 5000 [IU] via SUBCUTANEOUS
  Filled 2023-04-10 (×6): qty 1

## 2023-04-10 MED ORDER — FUROSEMIDE 20 MG PO TABS
20.0000 mg | ORAL_TABLET | Freq: Every day | ORAL | Status: DC
  Administered 2023-04-10: 20 mg via ORAL
  Filled 2023-04-10: qty 1

## 2023-04-10 MED ORDER — AMIODARONE HCL 200 MG PO TABS
100.0000 mg | ORAL_TABLET | Freq: Every day | ORAL | Status: DC
Start: 2023-04-10 — End: 2023-05-06
  Administered 2023-04-10 – 2023-04-12 (×3): 100 mg via ORAL
  Filled 2023-04-10 (×3): qty 1

## 2023-04-10 MED ORDER — HEPARIN (PORCINE) 25000 UT/250ML-% IV SOLN: 1000.0000 [IU]/h | INTRAVENOUS | Status: DC

## 2023-04-10 MED ORDER — METOPROLOL TARTRATE 25 MG PO TABS
12.5000 mg | ORAL_TABLET | Freq: Two times a day (BID) | ORAL | Status: DC
Start: 2023-04-10 — End: 2023-04-12
  Administered 2023-04-11 – 2023-04-12 (×3): 12.5 mg via ORAL
  Filled 2023-04-10 (×3): qty 1

## 2023-04-10 MED ORDER — FUROSEMIDE 40 MG PO TABS: 40.0000 mg | ORAL_TABLET | Freq: Every day | ORAL | Status: DC

## 2023-04-10 NOTE — Evaluation (Signed)
Physical Therapy Evaluation Patient Details Name: [name] MRN: [id number] DOB: 05-30-1946 Today's Date: 04/10/2023  History of Present Illness  Patient is a 77 year old female coming from home with progressively worsening shortness of breath. Her doctor at [facility] ordered CT which showed pleural effusion. S/p thoracentesis.  Clinical Impression  Patient is agreeable to PT evaluation. She was cooperative throughout session and is eager to regain independence. She reports living alone in senior apartments and participates with the [facility] program for the day. She has DME in place at home and has supportive family members who check in on her regularly.  Today, supervision provided for bed mobility. CGA provided for standing from bed and from bed side commode for safety. Patient ambulated a lap in the hallway with her own cane with CGA to Min A for occasional steadying. Patient requested to ambulate without oxygen in place. Sp02 88-92% on room air during the ambulation bout. Encouraged patient to consider using her 4 wheeled walker at home for increased support and to have a seat as needed for rest breaks. Recommend to continue PT to maximize independence and facilitate return to prior level of function. Patient indicates that [facility] provides physical therapy if needed.       If plan is discharge home, recommend the following: A little help with bathing/dressing/bathroom;Assistance with cooking/housework;A little help with walking and/or transfers;Assist for transportation   Can travel by private vehicle        Equipment Recommendations None recommended by PT  Recommendations for Other Services       Functional Status Assessment Patient has had a recent decline in their functional status and demonstrates the ability to make significant improvements in function in a reasonable and predictable amount of time.     Precautions / Restrictions Precautions Precautions: Fall Restrictions Weight  Bearing Restrictions: No      Mobility  Bed Mobility Overal bed mobility: Needs Assistance Bed Mobility: Supine to Sit, Sit to Supine     Supine to sit: Supervision, HOB elevated Sit to supine: Supervision, HOB elevated   General bed mobility comments: increased time required with no physical assistance    Transfers Overall transfer level: Needs assistance Equipment used: Straight cane Transfers: Sit to/from Stand Sit to Stand: Contact guard assist           General transfer comment: for standing from bed and from bed side commode    Ambulation/Gait Ambulation/Gait assistance: Contact guard assist, Min assist Gait Distance (Feet): 175 Feet Assistive device: Straight cane Gait Pattern/deviations: Step-through pattern, Decreased stride length Gait velocity: decreased     General Gait Details: patient requesting to walk without her oxygen in place. Patient required CGA with occasional steadying assistance provided using her own cane for ambulation around the hallway. she took several brief standing rest breaks and Sp02 ranged from 88%-92% on room air. encouraged patient to consider using her 4 wheeled walker for increased support and for seat for rest breaks as needed  Stairs            Wheelchair Mobility     Tilt Bed    Modified Rankin (Stroke Patients Only)       Balance Overall balance assessment: Needs assistance, History of Falls Sitting-balance support: Feet supported Sitting balance-Leahy Scale: Fair     Standing balance support: Single extremity supported, No upper extremity supported, During functional activity, Reliant on assistive device for balance Standing balance-Leahy Scale: Poor Standing balance comment: one loss of balance anteriorly when bending forward to  lift seat on bed side commode. Min A required for safety. otherwise CGA with single point cane                             Pertinent Vitals/Pain Pain Assessment Pain  Assessment: No/denies pain    Home Living Family/patient expects to be discharged to:: Private residence Living Arrangements: Alone Available Help at Discharge: Family;Available PRN/intermittently Type of Home: Apartment Home Access: Level entry       Home Layout: One level Home Equipment: Rollator (4 wheels);Cane - single point;Shower seat Additional Comments: oxygen at night up until recently where she has been wearing 24/7 at home    Prior Function Prior Level of Function : Independent/Modified Independent             Mobility Comments: participates with [facility] program during the day. Mod I with cane for ambulation. walks a mile 3 days per week ADLs Comments: patient reports she does her own laundry, ADLs. one recent fall in the shower     Extremity/Trunk Assessment   Upper Extremity Assessment Upper Extremity Assessment: Defer to OT evaluation    Lower Extremity Assessment Lower Extremity Assessment: Generalized weakness       Communication   Communication Communication: Hearing impairment (wears hearing aides)  Cognition Arousal: Alert Behavior During Therapy: WFL for tasks assessed/performed Overall Cognitive Status: Within Functional Limits for tasks assessed                                          General Comments      Exercises     Assessment/Plan    PT Assessment Patient needs continued PT services  PT Problem List Decreased strength;Decreased range of motion;Decreased activity tolerance;Decreased balance;Decreased mobility;Decreased safety awareness;Decreased knowledge of precautions       PT Treatment Interventions DME instruction;Gait training;Stair training;Functional mobility training;Therapeutic activities;Therapeutic exercise;Balance training;Neuromuscular re-education;Patient/family education;Cognitive remediation    PT Goals (Current goals can be found in the Care Plan section)  Acute Rehab PT Goals Patient Stated Goal:  to have an aide at home PT Goal Formulation: With patient Time For Goal Achievement: 04/24/23 Potential to Achieve Goals: Good    Frequency Min 1X/week     Co-evaluation               AM-PAC PT "6 Clicks" Mobility  Outcome Measure Help needed turning from your back to your side while in a flat bed without using bedrails?: None Help needed moving from lying on your back to sitting on the side of a flat bed without using bedrails?: A Little Help needed moving to and from a bed to a chair (including a wheelchair)?: A Little Help needed standing up from a chair using your arms (e.g., wheelchair or bedside chair)?: A Little Help needed to walk in hospital room?: A Little Help needed climbing 3-5 steps with a railing? : A Little 6 Click Score: 19    End of Session Equipment Utilized During Treatment: Gait belt Activity Tolerance: Patient tolerated treatment well Patient left: in bed;with call bell/phone within reach;with bed alarm set   PT Visit Diagnosis: Muscle weakness (generalized) (M62.81);Unsteadiness on feet (R26.81)    Time: 1610-9604 PT Time Calculation (min) (ACUTE ONLY): 27 min   Charges:   PT Evaluation $PT Eval Low Complexity: 1 Low   PT General Charges $$ ACUTE PT VISIT:  1 Visit         Donna Bernard, PT, MPT  [facility] 04/10/2023, 2:58 PM

## 2023-04-10 NOTE — Progress Notes (Addendum)
PROGRESS NOTE    [name]   WUJ:[id number] DOB: 08-Jul-1945  DOA: 04/07/2023 Date of Service: 04/10/23 which is hospital day 3  PCP: Pcp, No    HPI:  [name] is a 77 y.o. female with medical history significant of a fib and DVT on Eliquis, HTN, diet-controlled diabetes, COPD on 2 L oxygen at night, hypothyroidism, GERD, depression, anemia, CKD-3A, who presents with shortness of breath x several days progressively worsening.  Cough with little mucus production, chest pressure.PCP at [facility] - CT of chest done, moderate layered right pleural effusion.  Patient sent to ED  Hospital course / significant events:  11/01: to ED, admitted to hospitalist service. Thoracentesis pending.  11/02: reports persistent chest tightness and SOB, thoracentesis pending 11/03: thoracentesis today fluid labs pending, echo pending. Wait 24h at least (utnil tomorrow approx 4:00 PM) prior to restart anticoagulation. Pt is set up w/ pulmonary follow up outpatient Weds 11/06 11/04: hypotensive, pending echo, adjusting antihypertensive meds, PT/OT eval pending d/t significant weakness.   Consultants:  none  Procedures/Surgeries: 04/09/23 thoracentesis w/ Dr Aundria Rud      ASSESSMENT & PLAN:   Pleural effusion on right, etiology concerning for malignancy Secondhand smoke exposure   Neg procal and BCx NGx2d --> d/c abx  thoracentesis 11/03 --> cytology pending, cultures pending including AFB Personal review CXR this am appears stable, radiology over-read noted possible RML infiltrate but pt has no s/s pneumonia, repeat in AM tomrorow  Bronchodilators  PRN Mucinex Concern for cirrhosis on CT - hepatic labs normal --> follow outpatient  Echo results pending  Repeat CXR in AM 11/05 Continue home Lasix 20 mg daily     Hx Essential HTN (hypertension) BP soft here  Holding Enalapril Reduced metoprolol 25 --> 12.5 mg bid  Reduced amiodarone 200 --> 100 mg daily    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   CBG every morning   COPD (chronic obstructive pulmonary disease) (HCC) Bronchodilators, as needed Mucinex   Hypothyroidism Synthroid   Chronic kidney disease, stage 3a (HCC):  Renal function stable. Monitor BMP   Atrial fibrillation, chronic  Currently sinus rhythm  Metoprolol, amiodarone - reduced dose d/t low BP, see above  Eliquis to IV heparin pending thoracentesis, pt very nervous to restart heparin infusion, advised this was preferred but she requests for sq heparin pending discharge, in NSR relatively low risk and plan resume eliquis hopefully tomorrow    Hx DVT (deep venous thrombosis)  Plan eliquis on discharge, holding for now in case other procedures needed - see above    Right foot pain: no fracture Falls at home  Pain control PT/OT   Chest tightness - resolved Anxiety component - family states anxiety worse past few months  Hold heavily sedating medications w/ risk respiratory compromise but will trial low dose Xanax and will trial trazodone prn insmonia     Overweight based on BMI: Body mass index is 26.83 kg/m.  Underweight - under 18.5  normal weight - 18.5 to 24.9 overweight - 25 to 29.9 obese - 30 or more   DVT prophylaxis: on heparin  IV fluids: no continuous IV fluids  Nutrition: cardiac/carb diet  Central lines / invasive devices: none  Code Status: FULL CODE ACP documentation reviewed: has MOST on file in [facility]  TOC needs: TBD Barriers to dispo / significant pending items:  workup for effusion, soft BP, hopefully BP stabilize and can anticipate d/c tomorrow / day after.  Subjective / Brief ROS:  Patient reports she is very worried we have changed her home medications - I explained why we held/reduced doses given her low BP and we are monitoring closely. She reports since lasix was reduced she feels more bloated No chest pain/sharp pain Denies  new weakness.  Family reports overall she is weaker / less endurance, acknowledge some of this can be d/t evolving effusion Tolerating diet.    Family Communication: son at bedside on rounds     Objective Findings:  Vitals:   04/09/23 2145 04/10/23 0744 04/10/23 1221 04/10/23 1601  BP: (!) 103/52 (!) 98/48 117/65 (!) 101/52  Pulse: 63 65 72 68  Resp:  16  20  Temp:  97.8 F (36.6 C)  97.8 F (36.6 C)  TempSrc:      SpO2:  95%  99%  Weight:      Height:       No intake or output data in the 24 hours ending 04/10/23 1624  Filed Weights   04/07/23 1705 04/08/23 1727  Weight: 64.4 kg 64.2 kg    Examination:  Physical Exam Constitutional:      General: She is not in acute distress.    Appearance: She is not toxic-appearing.  Cardiovascular:     Rate and Rhythm: Normal rate and regular rhythm.     Heart sounds: Murmur heard.  Pulmonary:     Breath sounds: Examination of the right-middle field reveals decreased breath sounds. Examination of the right-lower field reveals decreased breath sounds. Decreased breath sounds present.  Abdominal:     Palpations: Abdomen is soft.  Musculoskeletal:     Right lower leg: No edema.     Left lower leg: No edema.  Skin:    General: Skin is warm and dry.  Neurological:     General: No focal deficit present.     Mental Status: She is alert.  Psychiatric:        Mood and Affect: Mood is anxious.        Behavior: Behavior normal.          Scheduled Medications:   amiodarone  100 mg Oral Daily   ferrous sulfate  325 mg Oral Daily   fluticasone furoate-vilanterol  1 puff Inhalation Daily   furosemide  20 mg Oral Daily   heparin injection (subcutaneous)  5,000 Units Subcutaneous Q8H   levothyroxine  125 mcg Oral Q0600   loratadine  10 mg Oral Daily   metoprolol tartrate  12.5 mg Oral BID   pantoprazole  40 mg Oral Daily   potassium chloride  10 mEq Oral Daily   rOPINIRole  0.25 mg Oral BID    Continuous  Infusions:    PRN Medications:  acetaminophen, albuterol, ALPRAZolam, dextromethorphan-guaiFENesin, diphenhydrAMINE, fluticasone, ondansetron (ZOFRAN) IV, polyvinyl alcohol, sodium chloride, traZODone  Antimicrobials from admission:  Anti-infectives (From admission, onward)    Start     Dose/Rate Route Frequency Ordered Stop   04/08/23 2200  vancomycin (VANCOREADY) IVPB 750 mg/150 mL  Status:  Discontinued        750 mg 150 mL/hr over 60 Minutes Intravenous Every 24 hours 04/07/23 2134 04/09/23 1210   04/07/23 2200  ceFEPIme (MAXIPIME) 2 g in sodium chloride 0.9 % 100 mL IVPB  Status:  Discontinued        2 g 200 mL/hr over 30 Minutes Intravenous Every 12 hours 04/07/23 2131 04/09/23 1210   04/07/23 2145  vancomycin (VANCOREADY) IVPB 1250 mg/250 mL  1,250 mg 166.7 mL/hr over 90 Minutes Intravenous  Once 04/07/23 2131 04/08/23 0001           Data Reviewed:  I have personally reviewed the following...  CBC: Recent Labs  Lab 04/07/23 1806 04/08/23 0627 04/09/23 0459 04/10/23 0454  WBC 14.3* 11.2* 8.9 8.1  NEUTROABS 10.7*  --   --   --   HGB 13.7 12.8 13.1 13.4  HCT 40.2 38.6 40.8 41.0  MCV 90.5 93.2 95.1 92.3  PLT 240 205 209 175   Basic Metabolic Panel: Recent Labs  Lab 04/07/23 1806 04/08/23 0627 04/10/23 0454  NA 136 136 136  K 4.2 4.4 4.1  CL 102 103 100  CO2 23 23 27   GLUCOSE 111* 106* 121*  BUN 33* 36* 40*  CREATININE 1.02* 1.07* 1.13*  CALCIUM 9.1 8.6* 8.8*   GFR: Estimated Creatinine Clearance: 36.1 mL/min (A) (by C-G formula based on SCr of 1.13 mg/dL (H)). Liver Function Tests: Recent Labs  Lab 04/08/23 0627  AST 31  ALT 14  ALKPHOS 34*  BILITOT 1.2  PROT 6.8  ALBUMIN 3.5   No results for input(s): "LIPASE", "AMYLASE" in the last 168 hours. No results for input(s): "AMMONIA" in the last 168 hours. Coagulation Profile: Recent Labs  Lab 04/07/23 2313  INR 1.7*   Cardiac Enzymes: No results for input(s): "CKTOTAL", "CKMB",  "CKMBINDEX", "TROPONINI" in the last 168 hours. BNP (last 3 results) No results for input(s): "PROBNP" in the last 8760 hours. HbA1C: No results for input(s): "HGBA1C" in the last 72 hours. CBG: Recent Labs  Lab 04/09/23 0802  GLUCAP 128*   Lipid Profile: No results for input(s): "CHOL", "HDL", "LDLCALC", "TRIG", "CHOLHDL", "LDLDIRECT" in the last 72 hours. Thyroid Function Tests: No results for input(s): "TSH", "T4TOTAL", "FREET4", "T3FREE", "THYROIDAB" in the last 72 hours. Anemia Panel: No results for input(s): "VITAMINB12", "FOLATE", "FERRITIN", "TIBC", "IRON", "RETICCTPCT" in the last 72 hours. Most Recent Urinalysis On File:     Component Value Date/Time   COLORURINE STRAW (A) 03/02/2022 1117   APPEARANCEUR CLEAR (A) 03/02/2022 1117   LABSPEC 1.008 03/02/2022 1117   PHURINE 5.0 03/02/2022 1117   GLUCOSEU NEGATIVE 03/02/2022 1117   HGBUR SMALL (A) 03/02/2022 1117   BILIRUBINUR NEGATIVE 03/02/2022 1117   KETONESUR NEGATIVE 03/02/2022 1117   PROTEINUR NEGATIVE 03/02/2022 1117   NITRITE NEGATIVE 03/02/2022 1117   LEUKOCYTESUR NEGATIVE 03/02/2022 1117   Sepsis Labs: @LABRCNTIP (procalcitonin:4,lacticidven:4) Microbiology: Recent Results (from the past 240 hour(s))  Culture, blood (Routine X 2) w Reflex to ID Panel     Status: None (Preliminary result)   Collection Time: 04/07/23 11:13 PM   Specimen: BLOOD RIGHT ARM  Result Value Ref Range Status   Specimen Description BLOOD RIGHT ARM  Final   Special Requests   Final    BOTTLES DRAWN AEROBIC AND ANAEROBIC Blood Culture adequate volume   Culture   Final    NO GROWTH 3 DAYS Performed at [facility], [address] [place], [place] [address]    Report Status PENDING  Incomplete  Culture, blood (Routine X 2) w Reflex to ID Panel     Status: None (Preliminary result)   Collection Time: 04/07/23 11:14 PM   Specimen: BLOOD RIGHT HAND  Result Value Ref Range Status   Specimen Description BLOOD RIGHT HAND  Final    Special Requests   Final    BOTTLES DRAWN AEROBIC AND ANAEROBIC Blood Culture adequate volume   Culture   Final    NO  GROWTH 3 DAYS Performed at [facility], [address] [place], [place] [address]    Report Status PENDING  Incomplete  Pleural fluid culture w Gram Stain     Status: None (Preliminary result)   Collection Time: 04/09/23  3:30 PM   Specimen: Pleural Fluid  Result Value Ref Range Status   Specimen Description   Final    PLEURAL Performed at [facility], [address] [place], [place] [address]    Special Requests   Final    NONE Performed at [facility], [address] [place], [place] [address]    Gram Stain   Final    RARE WBC PRESENT, PREDOMINANTLY PMN NO ORGANISMS SEEN    Culture   Final    NO GROWTH < 24 HOURS Performed at [facility], [address] [place], [place] [address]    Report Status PENDING  Incomplete      Radiology Studies last 3 days: DG Chest Port 1 View  Result Date: 04/10/2023 CLINICAL DATA:  142230 Pleural effusion 142230. EXAM: PORTABLE CHEST 1 VIEW COMPARISON:  Chest radiograph 04/09/2023. FINDINGS: Decreased, trace residual right pleural effusion. Increasing airspace opacity in the medial aspect of the right lung base, suspicious for aspiration or developing infection. No pneumothorax. IMPRESSION: Decreased, trace residual right pleural effusion. Increasing airspace opacity in the medial aspect of the right lung base, suspicious for aspiration or developing infection. Electronically Signed   By: Orvan Falconer M.D.   On: 04/10/2023 12:47   DG Chest Port 1 View  Result Date: 04/09/2023 CLINICAL DATA:  Post thoracentesis EXAM: PORTABLE CHEST 1 VIEW COMPARISON:  CT chest 04/07/2023, chest x-ray 05/22/2022 FINDINGS: Left-sided electronic recording device. Normal cardiac size. Mitral annular calcifications. Left lung grossly clear. Decreased right pleural effusion with small moderate residual  pleural effusion. No convincing right pneumothorax. Airspace disease at the right base. Aortic atherosclerosis. Old appearing left-sided rib fractures. Left shoulder replacement IMPRESSION: Decreased right pleural effusion with small to moderate residual pleural effusion. No convincing pneumothorax. Airspace disease at the right base, atelectasis versus pneumonia. Electronically Signed   By: Jasmine Pang M.D.   On: 04/09/2023 16:27   US Abdomen Limited RUQ (LIVER/GB)  Result Date: 04/09/2023 CLINICAL DATA:  Cirrhosis EXAM: ULTRASOUND ABDOMEN LIMITED RIGHT UPPER QUADRANT COMPARISON:  None Available. FINDINGS: Gallbladder: Prior cholecystectomy. Common bile duct: Diameter: 6 mm. Liver: No focal lesion identified. Nodular liver contour. Normal parenchymal echogenicity. Portal vein is patent on color Doppler imaging with normal direction of blood flow towards the liver. Other: Right pleural effusion. IMPRESSION: 1. Cirrhotic liver morphology. 2. Right pleural effusion. Electronically Signed   By: Allegra Lai M.D.   On: 04/09/2023 12:59   DG Foot Complete Right  Result Date: 04/08/2023 CLINICAL DATA:  Right foot pain EXAM: RIGHT FOOT COMPLETE - 3+ VIEW COMPARISON:  None Available. FINDINGS: No fracture or dislocation is seen. Mild degenerative changes of the 1st MCP joint. Degenerative changes of the dorsal midfoot. Moderate posterior and plantar enthesophytes. IMPRESSION: No fracture or dislocation is seen. Degenerative changes, as above. Electronically Signed   By: Charline Bills M.D.   On: 04/08/2023 01:55   CT CHEST WO CONTRAST  Result Date: 04/07/2023 CLINICAL DATA:  Chest pain, shortness of breath EXAM: CT CHEST WITHOUT CONTRAST TECHNIQUE: Multidetector CT imaging of the chest was performed following the standard protocol without IV contrast. RADIATION DOSE REDUCTION: This exam was performed according to the departmental dose-optimization program which includes automated exposure control,  adjustment of the mA and/or kV according to patient size and/or use of iterative reconstruction technique. COMPARISON:  CT 05/23/2022 FINDINGS: Cardiovascular: Heart size is upper limits of normal. No pericardial effusion. Thoracic aorta is nonaneurysmal. There are atherosclerotic vascular calcifications of the aorta and coronary arteries. Calcification of the mitral annulus. Central pulmonary vasculature is nondilated. Mediastinum/Nodes: No enlarged mediastinal or axillary lymph nodes. Thyroid gland, trachea, and esophagus demonstrate no significant findings. Small hiatal hernia. Lungs/Pleura: Moderate-large layering right-sided pleural effusion with complete atelectasis of the right lower lobe and near-complete atelectasis of the right middle lobe. Partial compressive atelectasis of the right upper lobe. Mild mosaic attenuation of the aerated portion of the right upper lobe and within the left lung. No left-sided pleural effusion. No pneumothorax. Upper Abdomen: Nodular hepatic surface contour. No acute abnormality. Musculoskeletal: No chest wall mass or suspicious bone lesions identified. Left chest wall loop recorder. IMPRESSION: 1. Moderate-large layering right-sided pleural effusion with complete atelectasis of the right lower lobe and near-complete atelectasis of the right middle lobe. Partial compressive atelectasis of the right upper lobe. 2. Mild mosaic attenuation of the aerated portion of the right upper lobe and within the left lung, which may be related to small airways disease. 3. Nodular hepatic surface contour, suggestive of cirrhosis. 4. Aortic and coronary artery atherosclerosis (ICD10-I70.0). Electronically Signed   By: Duanne Guess D.O.   On: 04/07/2023 15:25         Sunnie Nielsen, DO [facility] 04/10/2023, 4:24 PM    Dictation software may have been used to generate the above note. Typos may occur and escape review in typed/dictated notes. Please contact Dr  Lyn Hollingshead directly for clarity if needed.  Staff may message me via secure chat in Epic  but this may not receive an immediate response,  please page me for urgent matters!  If 7PM-7AM, please contact night coverage [email or web address]

## 2023-04-10 NOTE — TOC Initial Note (Addendum)
Transition of Care [facility]) - Initial/Assessment Note    Patient Details  Name: [name] MRN: [id number] Date of Birth: 05-31-1946  [facility]) CM/SW Contact:    Allena Katz, LCSW Phone Number: 04/10/2023, 1:51 PM  Clinical Narrative:   Pt admitted from home alone with right PE. Pt is active [facility] participant and will follow up with them at Discharge. CSW has connected with [facility] CM robin who reports pt has a strong support system through her family. [facility] to provide all post discharge needs for patient and plans to transport patient home at discharge. [facility] CM reports pt was pretty mobile before admission. PT/OT ordered to assess if patient has any post discharge needs.                 Expected Discharge Plan: Home/Self Care Barriers to Discharge: Continued Medical Work up   Patient Goals and CMS Choice Patient states their goals for this hospitalization and ongoing recovery are:: Return home with [facility] services. CMS Medicare.gov Compare Post Acute Care list provided to:: Patient        Expected Discharge Plan and Services       Living arrangements for the past 2 months: Apartment                                      Prior Living Arrangements/Services Living arrangements for the past 2 months: Apartment Lives with:: Self            Care giver support system in place?: Yes (comment)      Activities of Daily Living   ADL Screening (condition at time of admission) Independently performs ADLs?: Yes (appropriate for developmental age) Is the patient deaf or have difficulty hearing?: Yes Does the patient have difficulty seeing, even when wearing glasses/contacts?: No Does the patient have difficulty concentrating, remembering, or making decisions?: No  Permission Sought/Granted                  Emotional Assessment       Orientation: : Oriented to Self, Oriented to Place, Oriented to  Time, Oriented to Situation      Admission  diagnosis:  SOB (shortness of breath) [R06.02] Pleural effusion [J90] Pleural effusion on right [J90] Patient Active Problem List   Diagnosis Date Noted   Pleural effusion on right 04/07/2023   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   Chronic kidney disease, stage 3a (HCC) 04/07/2023   Atrial fibrillation, chronic (HCC) 04/07/2023   DVT (deep venous thrombosis) (HCC) 04/07/2023   Leukocytosis 04/07/2023   Right foot pain 04/07/2023   Acute sinusitis 05/23/2022   COPD exacerbation (HCC) 05/22/2022   Atrial fibrillation with RVR (HCC) 05/22/2022   Chest pain 05/22/2022   HTN (hypertension) 05/22/2022   Diabetes mellitus without complication (HCC) 05/22/2022   COPD (chronic obstructive pulmonary disease) (HCC) 05/22/2022   Hypothyroidism 05/22/2022   Iron deficiency anemia 05/22/2022   Osteoarthritis of right knee 03/14/2022   PCP:  Pcp, No Pharmacy:   [facility] [place], [place] - [address] [place] [address] Phone: [phone] Fax: [phone]     Social Determinants of Health (SDOH) Social History: SDOH Screenings   Food Insecurity: No Food Insecurity (04/08/2023)  Housing: Low Risk  (04/08/2023)  Transportation Needs: No Transportation Needs (04/08/2023)  Utilities: Not At Risk (04/08/2023)  Tobacco Use: Low Risk  (04/07/2023)  SDOH Interventions:     Readmission Risk Interventions    04/10/2023    1:51 PM  Readmission Risk Prevention Plan  Transportation Screening Complete  PCP or Specialist Appt within 3-5 Days Complete  Palliative Care Screening Complete  Medication Review (RN Care Manager) Complete

## 2023-04-10 NOTE — Care Management Important Message (Signed)
Important Message  Patient Details  Name: [name] MRN: [id number] Date of Birth: [birth year]   Important Message Given:  N/A - LOS <3 / Initial given by admissions     Olegario Messier A Danyele Smejkal 04/10/2023, 9:34 AM

## 2023-04-10 NOTE — Evaluation (Signed)
Occupational Therapy Evaluation Patient Details Name: [name] MRN: [id number] DOB: [birth year] Today's Date: 04/10/2023   History of Present Illness Patient is a 77 year old female coming from home with progressively worsening shortness of breath. Her doctor at [facility] ordered CT which showed pleural effusion. S/p thoracentesis.   Clinical Impression   Pt was seen for OT evaluation this date. Prior to hospital admission, pt was generally independent until the past two weeks, particularly her laundry. Pt presents to acute OT demonstrating impaired ADL performance and functional mobility 2/2 SOB, impaired activity tolerance, balance, and strength (See OT problem list for additional functional deficits). Pt currently requires CGA for ADL transfers, CGA for standing pericare and clothing mgt after toileting via BSC with MIN A to correct for LOB. VC for rest breaks to support recovery. While walking with PT pt SpO2 88-92% on room air. Pt would benefit from skilled OT services to address noted impairments and functional limitations (see below for any additional details) in order to maximize safety and independence while minimizing falls risk and caregiver burden.     If plan is discharge home, recommend the following: A little help with walking and/or transfers;A little help with bathing/dressing/bathroom;Assistance with cooking/housework;Assist for transportation;Help with stairs or ramp for entrance    Functional Status Assessment  Patient has had a recent decline in their functional status and demonstrates the ability to make significant improvements in function in a reasonable and predictable amount of time.  Equipment Recommendations  Other (comment) (defer to PACE)    Recommendations for Other Services       Precautions / Restrictions Precautions Precautions: Fall Restrictions Weight Bearing Restrictions: No      Mobility Bed Mobility Overal bed mobility: Needs Assistance Bed  Mobility: Sit to Supine       Sit to supine: Supervision, HOB elevated        Transfers Overall transfer level: Needs assistance Equipment used: Straight cane Transfers: Sit to/from Stand Sit to Stand: Contact guard assist           General transfer comment: for standing from bed and from bed side commode      Balance Overall balance assessment: Needs assistance, History of Falls Sitting-balance support: Feet supported Sitting balance-Leahy Scale: Fair     Standing balance support: Single extremity supported, No upper extremity supported, During functional activity, Reliant on assistive device for balance Standing balance-Leahy Scale: Poor Standing balance comment: one loss of balance anteriorly when bending forward to lift seat on bed side commode. Min A required for safety. otherwise CGA with single point cane                           ADL either performed or assessed with clinical judgement   ADL Overall ADL's : Needs assistance/impaired                     Lower Body Dressing: Sit to/from stand;Contact guard assist   Toilet Transfer: Minimal assistance;Contact guard assist;BSC/3in1;Rolling walker (2 wheels) Toilet Transfer Details (indicate cue type and reason): slight LOB during pericare/clothing mgt requiring assist Toileting- Clothing Manipulation and Hygiene: Contact guard assist;Sit to/from stand       Functional mobility during ADLs: Contact guard assist;Cane       Vision         Perception         Praxis         Pertinent Vitals/Pain Pain Assessment Pain Assessment:  No/denies pain     Extremity/Trunk Assessment Upper Extremity Assessment Upper Extremity Assessment: Generalized weakness   Lower Extremity Assessment Lower Extremity Assessment: Generalized weakness       Communication Communication Communication: Hearing impairment (wears hearing aides)   Cognition Arousal: Alert Behavior During Therapy: WFL for  tasks assessed/performed Overall Cognitive Status: Within Functional Limits for tasks assessed                                       General Comments       Exercises Other Exercises Other Exercises: Pt educated in rest breaks and incorporating ECS into daily ADL/IADL routines to minimize over exertion and SOB.   Shoulder Instructions      Home Living Family/patient expects to be discharged to:: Private residence Living Arrangements: Alone Available Help at Discharge: Family;Available PRN/intermittently Type of Home: Apartment Home Access: Level entry     Home Layout: One level     Bathroom Shower/Tub: Runner, broadcasting/film/video: Rollator (4 wheels);Cane - single point;Shower seat   Additional Comments: oxygen at night up until recently where she has been wearing 24/7 at home      Prior Functioning/Environment Prior Level of Function : Independent/Modified Independent             Mobility Comments: participates with [facility] program during the day. Mod I with cane for ambulation. walks a mile 3 days per week ADLs Comments: patient reports she does her own laundry,  ADLs. one recent fall in the shower. Has been unable to complete her laundry over the past 2 weeks 2/2 SOB and fatigue along with citing weather as hindering her breathing.        OT Problem List: Decreased strength;Decreased activity tolerance;Decreased safety awareness;Impaired balance (sitting and/or standing);Decreased knowledge of use of DME or AE;Cardiopulmonary status limiting activity      OT Treatment/Interventions: Self-care/ADL training;Therapeutic exercise;Therapeutic activities;Energy conservation;DME and/or AE instruction;Patient/family education;Balance training    OT Goals(Current goals can be found in the care plan section) Acute Rehab OT Goals Patient Stated Goal: get more help for laundry OT Goal Formulation: With patient Time For Goal Achievement:  04/24/23 Potential to Achieve Goals: Good ADL Goals Pt Will Perform Lower Body Dressing: with modified independence;sit to/from stand Pt Will Transfer to Toilet: with modified independence;ambulating (LRAD) Pt Will Perform Toileting - Clothing Manipulation and hygiene: with modified independence Additional ADL Goal #1: Pt will verbalize plan for implementing at least 2 learned ECS into daily ADL/IADL routines to improve safety, indep, and minimize risk of over exertion and SOB.  OT Frequency: Min 1X/week    Co-evaluation              AM-PAC OT "6 Clicks" Daily Activity     Outcome Measure Help from another person eating meals?: None Help from another person taking care of personal grooming?: A Little Help from another person toileting, which includes using toliet, bedpan, or urinal?: A Little Help from another person bathing (including washing, rinsing, drying)?: A Little Help from another person to put on and taking off regular upper body clothing?: None Help from another person to put on and taking off regular lower body clothing?: A Little 6 Click Score: 20   End of Session Equipment Utilized During Treatment: Gait belt;Rolling walker (2 wheels);Oxygen (O2 replaced at end of session)  Activity Tolerance: Patient tolerated treatment well  Patient left: in bed;with call bell/phone within reach;with bed alarm set  OT Visit Diagnosis: Other abnormalities of gait and mobility (R26.89);Muscle weakness (generalized) (M62.81)                Time: 6045-4098 OT Time Calculation (min): 15 min Charges:  OT General Charges $OT Visit: 1 Visit OT Evaluation $OT Eval Low Complexity: 1 Low  Arman Filter., MPH, MS, OTR/L ascom [phone] 04/10/23, 5:32 PM

## 2023-04-10 NOTE — Progress Notes (Addendum)
ANTICOAGULATION CONSULT NOTE  Pharmacy Consult for Heparin Infusion Indication: AF  Allergies  Allergen Reactions   Cephalosporins Itching   Codeine Other (See Comments)    Extreme drowsiness   Contrast Media [Iodinated Contrast Media] Hives and Swelling    Facial swelling   Oxycodone Other (See Comments)    Extreme drowsiness    Patient Measurements: Height: 5\' 2"  (157.5 cm) Weight: 64.2 kg (141 lb 8.6 oz) IBW/kg (Calculated) : 50.1 Heparin Dosing Weight: 63.1 kg  Vital Signs: Temp: 97.8 F (36.6 C) (11/04 0744) BP: 117/65 (11/04 1221) Pulse Rate: 72 (11/04 1221)  Labs: Recent Labs    04/07/23 1806 04/07/23 1806 04/07/23 2313 04/08/23 0627 04/08/23 1029 04/08/23 2009 04/09/23 0459 04/10/23 0454  HGB 13.7  --   --  12.8  --   --  13.1 13.4  HCT 40.2  --   --  38.6  --   --  40.8 41.0  PLT 240  --   --  205  --   --  209 175  APTT  --    < > 42*  --  120* 97* 60*  --   LABPROT  --   --  19.8*  --   --   --   --   --   INR  --   --  1.7*  --   --   --   --   --   HEPARINUNFRC  --   --   --   --  >1.10*  --  >1.10*  --   CREATININE 1.02*  --   --  1.07*  --   --   --  1.13*   < > = values in this interval not displayed.    Estimated Creatinine Clearance: 36.1 mL/min (A) (by C-G formula based on SCr of 1.13 mg/dL (H)).   Medical History: Past Medical History:  Diagnosis Date   Anemia    Asthma    COPD (chronic obstructive pulmonary disease) (HCC)    Diabetes mellitus without complication (HCC)    type 2   DVT (deep venous thrombosis) (HCC) 1980   GERD (gastroesophageal reflux disease)    History of hiatal hernia    Hypertension    Hypothyroidism     Assessment: [name] is a 77 y.o. female presenting with shortness of breath and pleural effusion. PMH significant for HTN, COPD, hypothyroidism, T2DM, AF, IDA. Patient was on [facility] PTA per chart review. Last dose of apixaban was 10/31. Pharmacy has been consulted to initiate and manage heparin infusion.    Baseline Labs: aPTT 42, PT 19.8, INR 1.7, Hgb 14.3, Hct 40.2, Plt 240   Goal of Therapy:  Heparin level 0.3-0.7 units/ml aPTT 66-102 seconds Monitor platelets by anticoagulation protocol: Yes  Date Time aPTT/HL Rate/Comment  11/2 1029 120/>1.10 1000/supratherapeutic 11/2 2009 97/---  900/therapeutic x1 11/03 0459 60 / >1.1 aPTT subtherapeutic  Heparin infusion stopped on 11/03 at 0945 for thoracentesis. Per pulmonology, heparin infusion should be resumed 24 hours after procedure. CBC is stable, will resume heparin infusion at 1500 today without a bolus. Will continue to monitor aPTT levels until correlating with Anti-Xa levels.   Plan: Resume heparin infusion at 1000 units/hour Check 8-hour aPTT and Anti-Xa levels Monitor aPTT levels until correlating with Anti-Xa levels, then monitor with Anti-Xa levels alone Monitor CBC and signs/symptoms of bleeding  Thank you for involving pharmacy in this patient's care.   Rockwell Alexandria, PharmD Clinical Pharmacist 04/10/2023 1:48 PM  ______________________________________________________________________________  11/04 1410 Update  Per Dr. Lyn Hollingshead, patient would prefer not to be on heparin infusion. Patient is in NSR (rate/rhythm controlled on PTA metoprolol and amiodarone). Will hold off on resuming PTA apixaban for now due to potential upcoming procedures during hospitalization.   Plan: Do not resume heparin infusion Start VTE prophylaxis with heparin 5000 units SQ three times daily F/u resuming PTA apixaban as able  Thank you for involving pharmacy in this patient's care.   Rockwell Alexandria, PharmD Clinical Pharmacist 04/10/2023 2:09 PM

## 2023-04-10 NOTE — Plan of Care (Signed)

## 2023-04-11 ENCOUNTER — Inpatient Hospital Stay: Payer: Medicare (Managed Care)

## 2023-04-11 DIAGNOSIS — J90 Pleural effusion, not elsewhere classified: Secondary | ICD-10-CM | POA: Diagnosis not present

## 2023-04-11 LAB — BASIC METABOLIC PANEL
Anion gap: 10 (ref 5–15)
BUN: 27 mg/dL — ABNORMAL HIGH (ref 8–23)
CO2: 25 mmol/L (ref 22–32)
Calcium: 8.5 mg/dL — ABNORMAL LOW (ref 8.9–10.3)
Chloride: 97 mmol/L — ABNORMAL LOW (ref 98–111)
Creatinine, Ser: 0.87 mg/dL (ref 0.44–1.00)
GFR, Estimated: >60 mL/min (ref >60)
Glucose, Bld: 180 mg/dL — ABNORMAL HIGH (ref 70–99)
Potassium: 3.6 mmol/L (ref 3.5–5.1)
Sodium: 132 mmol/L — ABNORMAL LOW (ref 135–145)

## 2023-04-11 LAB — TRIGLYCERIDES, BODY FLUIDS: Triglycerides, Fluid: 76 mg/dL

## 2023-04-11 LAB — GLUCOSE, CAPILLARY: Glucose-Capillary: 129 mg/dL — ABNORMAL HIGH (ref 70–99)

## 2023-04-11 MED ORDER — BISACODYL 10 MG RE SUPP
10.0000 mg | Freq: Every day | RECTAL | Status: DC | PRN
  Administered 2023-04-11: 10 mg via RECTAL
  Filled 2023-04-11: qty 1

## 2023-04-11 MED ORDER — FLEET ENEMA RE ENEM: 1.0000 | ENEMA | Freq: Every day | RECTAL | Status: DC | PRN

## 2023-04-11 MED ORDER — AZITHROMYCIN 500 MG PO TABS
250.0000 mg | ORAL_TABLET | Freq: Every day | ORAL | Status: DC
  Administered 2023-04-12: 250 mg via ORAL
  Filled 2023-04-11: qty 1

## 2023-04-11 MED ORDER — AMOXICILLIN-POT CLAVULANATE 875-125 MG PO TABS
1.0000 | ORAL_TABLET | Freq: Two times a day (BID) | ORAL | Status: DC
  Administered 2023-04-11 – 2023-04-12 (×2): 1 via ORAL
  Filled 2023-04-11 (×2): qty 1

## 2023-04-11 MED ORDER — MAGNESIUM HYDROXIDE 400 MG/5ML PO SUSP
15.0000 mL | Freq: Every day | ORAL | Status: DC | PRN
  Administered 2023-04-11: 15 mL via ORAL
  Filled 2023-04-11: qty 30

## 2023-04-11 MED ORDER — AZITHROMYCIN 500 MG PO TABS
500.0000 mg | ORAL_TABLET | Freq: Once | ORAL | Status: AC
  Administered 2023-04-11: 500 mg via ORAL
  Filled 2023-04-11: qty 1

## 2023-04-11 MED ORDER — FUROSEMIDE 20 MG PO TABS
20.0000 mg | ORAL_TABLET | Freq: Two times a day (BID) | ORAL | Status: DC
  Administered 2023-04-11 – 2023-04-12 (×3): 20 mg via ORAL
  Filled 2023-04-11 (×3): qty 1

## 2023-04-11 NOTE — Care Management Important Message (Signed)
Important Message  Patient Details  Name: [name] MRN: [id number] Date of Birth: [birth year]   Important Message Given:  N/A - LOS <3 / Initial given by admissions     Olegario Messier A Koltyn Kelsay 04/11/2023, 9:11 AM

## 2023-04-11 NOTE — Progress Notes (Signed)
Physical Therapy Treatment Patient Details Name: [name] MRN: [id number] DOB: [birth year] Today's Date: 04/11/2023   History of Present Illness Patient is a 77 year old female coming from home with progressively worsening shortness of breath. Her doctor at [facility] ordered CT which showed pleural effusion. S/p thoracentesis.    PT Comments  In bed at rest.  Was up earlier in chair but fatigued quickly.  Bed mobility is improved today with supervision.  She does not want to use O2 for gait and sats remain 94/95% on room air x 3 laps but she asks to put O2 back on after gait.  Said at baseline she used it only at night and sats were typically 95% at home too.  She walks x 2 laps with SPC and often reaching for railing or objects with L hand for balance.  She is encouraged and walks in the middle of hallway to take extra support away, while she has no formal LOB's gait is generally unsteady with decreased speed and noted increased caution.  She does walk x 1 lap with RW and CGA x 1.  Balance is improved with RW.  Pt is encouraged to use RW or rollator at home but I am unsure if she will do so.  RN aware she is able to maintain O2 sats with gait. Discussed with family.   If plan is discharge home, recommend the following: A little help with bathing/dressing/bathroom;Assistance with cooking/housework;A little help with walking and/or transfers;Assist for transportation   Can travel by private vehicle        Equipment Recommendations  None recommended by PT    Recommendations for Other Services       Precautions / Restrictions Precautions Precautions: Fall Restrictions Weight Bearing Restrictions: No     Mobility  Bed Mobility Overal bed mobility: Modified Independent Bed Mobility: Supine to Sit, Sit to Supine     Supine to sit: Modified independent (Device/Increase time) Sit to supine: Modified independent (Device/Increase time)     Patient Response:  Cooperative  Transfers Overall transfer level: Needs assistance Equipment used: Straight cane Transfers: Sit to/from Stand Sit to Stand: Supervision                Ambulation/Gait Ambulation/Gait assistance: Contact guard assist, Supervision Gait Distance (Feet): 450 Feet Assistive device: Straight cane, Rolling walker (2 wheels) Gait Pattern/deviations: Step-through pattern, Decreased stride length Gait velocity: decreased     General Gait Details: x 2 with SPC, x 1 with RW  -   Stairs             Wheelchair Mobility     Tilt Bed Tilt Bed Patient Response: Cooperative  Modified Rankin (Stroke Patients Only)       Balance Overall balance assessment: Needs assistance, History of Falls Sitting-balance support: Feet supported Sitting balance-Leahy Scale: Fair     Standing balance support: Single extremity supported, No upper extremity supported, During functional activity, Reliant on assistive device for balance Standing balance-Leahy Scale: Poor                              Cognition Arousal: Alert Behavior During Therapy: WFL for tasks assessed/performed Overall Cognitive Status: Within Functional Limits for tasks assessed  Exercises      General Comments        Pertinent Vitals/Pain Pain Assessment Pain Assessment: No/denies pain    Home Living                          Prior Function            PT Goals (current goals can now be found in the care plan section) Progress towards PT goals: Progressing toward goals    Frequency    Min 1X/week      PT Plan      Co-evaluation              AM-PAC PT "6 Clicks" Mobility   Outcome Measure  Help needed turning from your back to your side while in a flat bed without using bedrails?: None Help needed moving from lying on your back to sitting on the side of a flat bed without using bedrails?:  None Help needed moving to and from a bed to a chair (including a wheelchair)?: A Little Help needed standing up from a chair using your arms (e.g., wheelchair or bedside chair)?: A Little Help needed to walk in hospital room?: A Little Help needed climbing 3-5 steps with a railing? : A Little 6 Click Score: 20    End of Session Equipment Utilized During Treatment: Gait belt Activity Tolerance: Patient tolerated treatment well Patient left: in bed;with call bell/phone within reach;with bed alarm set   PT Visit Diagnosis: Muscle weakness (generalized) (M62.81);Unsteadiness on feet (R26.81)     Time: 1012-1040 PT Time Calculation (min) (ACUTE ONLY): 28 min  Charges:    $Gait Training: 23-37 mins PT General Charges $$ ACUTE PT VISIT: 1 Visit                   Danielle Dess, PTA 04/11/23, 10:55 AM

## 2023-04-11 NOTE — Plan of Care (Signed)

## 2023-04-11 NOTE — Progress Notes (Addendum)
PROGRESS NOTE    [name]   VHQ:[id number] DOB: 12-12-1945  DOA: 04/07/2023 Date of Service: 04/11/23 which is hospital day 4  PCP: Pcp, No    HPI:  Annaliza [name] is a 77 y.o. female with medical history significant of a fib and DVT on Eliquis, HTN, diet-controlled diabetes, COPD on 2 L oxygen at night, hypothyroidism, GERD, depression, anemia, CKD-3A, who presents with shortness of breath x several days progressively worsening.  Cough with little mucus production, chest pressure.PCP at [facility] - CT of chest done, moderate layered right pleural effusion.  Patient sent to ED  Hospital course / significant events:  11/01: to ED, admitted to hospitalist service. Thoracentesis pending.  11/02: reports persistent chest tightness and SOB, thoracentesis pending 11/03: thoracentesis today fluid labs pending, echo pending. Wait 24h at least (utnil tomorrow approx 4:00 PM) prior to restart anticoagulation. Pt is set up w/ pulmonary follow up outpatient Weds 11/06 11/04: hypotensive, pending echo, adjusting antihypertensive meds, PT/OT eval pending d/t significant weakness.  11/05: CXR a bit worse on R question effusion/infiltrate, will cover for PNA and increase lasix. Pleural cytology pending   Consultants:  none  Procedures/Surgeries: 04/09/23 thoracentesis w/ Dr Aundria Rud      ASSESSMENT & PLAN:   Pleural effusion on right, etiology concerning for malignancy vs infection vs hepatic Secondhand smoke exposure   S/p thoracentesis 04/09/23 Pleural fluid analysis (+)leukocytosis monocyte predominant, and significant elevation in LDH. DDx infectious etiologies, malignancy.  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   CBG every morning   COPD (chronic obstructive pulmonary disease), Asthma  Bronchodilators, as needed Mucinex Pt declines inhalers    Hypothyroidism Synthroid   Chronic kidney disease, stage 3a (HCC):  Renal function stable. Monitor BMP   Atrial fibrillation, chronic  Currently sinus rhythm  Metoprolol, amiodarone - reduced dose d/t low BP, see above  Eliquis was intiially held and pt was on IV heparin pending thoracentesis, pt very nervous to restart heparin infusion, advised this was preferred but she requests for sq heparin pending discharge   Hx DVT (deep venous thrombosis)  Plan eliquis on discharge, holding for now in case other procedures needed - see above    Right foot pain: no fracture Falls at home  Pain control PT/OT   Chest tightness - resolved Anxiety component - family states anxiety worse past few months  Hold heavily sedating medications w/ risk respiratory compromise but will trial low dose Xanax and will trial trazodone prn insmonia   AKI resolved Caution w/ lasix  Recheck BMP in am    Overweight based on BMI: Body mass index is 26.83 kg/m.  Underweight - under 18.5  normal weight - 18.5 to 24.9 overweight - 25 to 29.9 obese - 30 or more   DVT prophylaxis: on heparin sq IV fluids: no continuous IV fluids  Nutrition: cardiac/carb diet  Central lines / invasive devices: none  Code Status: FULL CODE ACP documentation reviewed: has MOST on file in [facility]  TOC needs: home health  Barriers to dispo / significant pending items:  hopefully diurese some and stabilize overnight, pt is very anxious but if remains stable into tomorrow should be ok for discharge               Subjective / Brief ROS:  Patient reports still SOB today on exertion but walked ok w/ therapy  No chest pain/sharp pain Denies new weakness.  Reports significant anxiety  Tolerating diet.    Family Communication: spoke w/ son in the hallway after I'd seen the patient he was on his way in to visit     Objective Findings:  Vitals:   04/10/23 1601 04/10/23 1945 04/11/23 0511 04/11/23 0824  BP: (!) 101/52 (!) 113/55 117/67 127/70  Pulse: 68 70 77 85  Resp: 20 17  15   Temp: 97.8 F (36.6 C) 98.2 F (36.8 C) 98 F (36.7 C) 97.7 F (36.5 C)  TempSrc:      SpO2: 99% 97% 95% 93%  Weight:      Height:       No intake or output data in the 24 hours ending 04/11/23 1358  Filed Weights   04/07/23 1705 04/08/23 1727  Weight: 64.4 kg 64.2 kg    Examination:  Physical Exam Constitutional:      General: She is not in acute distress.    Appearance: She is not toxic-appearing.  Cardiovascular:     Rate and Rhythm: Normal rate and regular rhythm.     Heart sounds: Murmur heard.  Pulmonary:     Breath sounds: Examination of the right-middle field reveals decreased breath sounds. Examination of the right-lower field reveals decreased breath sounds. Decreased breath sounds present.  Abdominal:     Palpations: Abdomen is soft.  Musculoskeletal:     Right lower leg: No edema.     Left lower leg: No edema.  Skin:    General: Skin is warm and dry.  Neurological:     General: No focal deficit present.     Mental Status: She is alert.  Psychiatric:        Mood and Affect: Mood is anxious.        Behavior: Behavior normal.          Scheduled Medications:   amiodarone  100 mg Oral Daily   amoxicillin-clavulanate  1 tablet Oral BID WC   [START ON 04/12/2023] azithromycin  250 mg Oral Daily   azithromycin  500 mg Oral Once   ferrous sulfate  325 mg Oral Daily   furosemide  20 mg Oral BID WC   heparin injection (subcutaneous)  5,000 Units Subcutaneous  Q8H   levothyroxine  125 mcg Oral Q0600   loratadine  10 mg Oral Daily   metoprolol tartrate  12.5 mg Oral BID   pantoprazole  40 mg Oral Daily   potassium chloride  10 mEq Oral Daily   rOPINIRole  0.25 mg Oral BID    Continuous Infusions:    PRN Medications:  acetaminophen, albuterol, ALPRAZolam, dextromethorphan-guaiFENesin, diphenhydrAMINE, fluticasone, ondansetron (ZOFRAN) IV, polyvinyl alcohol, sodium chloride, traZODone  Antimicrobials from admission:  Anti-infectives (From admission, onward)    Start     Dose/Rate Route Frequency Ordered Stop   04/12/23 1000  azithromycin (ZITHROMAX) tablet 250 mg        250 mg Oral Daily 04/11/23 1336 04/16/23 0959   04/11/23 1430  azithromycin (ZITHROMAX) tablet 500  mg        500 mg Oral  Once 04/11/23 1336     04/11/23 1400  amoxicillin-clavulanate (AUGMENTIN) 875-125 MG per tablet 1 tablet        1 tablet Oral 2 times daily with meals 04/11/23 1336 04/18/23 1659   04/08/23 2200  vancomycin (VANCOREADY) IVPB 750 mg/150 mL  Status:  Discontinued        750 mg 150 mL/hr over 60 Minutes Intravenous Every 24 hours 04/07/23 2134 04/09/23 1210   04/07/23 2200  ceFEPIme (MAXIPIME) 2 g in sodium chloride 0.9 % 100 mL IVPB  Status:  Discontinued        2 g 200 mL/hr over 30 Minutes Intravenous Every 12 hours 04/07/23 2131 04/09/23 1210   04/07/23 2145  vancomycin (VANCOREADY) IVPB 1250 mg/250 mL        1,250 mg 166.7 mL/hr over 90 Minutes Intravenous  Once 04/07/23 2131 04/08/23 0001           Data Reviewed:  I have personally reviewed the following...  CBC: Recent Labs  Lab 04/07/23 1806 04/08/23 0627 04/09/23 0459 04/10/23 0454  WBC 14.3* 11.2* 8.9 8.1  NEUTROABS 10.7*  --   --   --   HGB 13.7 12.8 13.1 13.4  HCT 40.2 38.6 40.8 41.0  MCV 90.5 93.2 95.1 92.3  PLT 240 205 209 175   Basic Metabolic Panel: Recent Labs  Lab 04/07/23 1806 04/08/23 0627 04/10/23 0454 04/11/23 0549  NA 136 136 136 132*  K 4.2 4.4 4.1 3.6   CL 102 103 100 97*  CO2 23 23 27 25   GLUCOSE 111* 106* 121* 180*  BUN 33* 36* 40* 27*  CREATININE 1.02* 1.07* 1.13* 0.87  CALCIUM 9.1 8.6* 8.8* 8.5*   GFR: Estimated Creatinine Clearance: 46.9 mL/min (by C-G formula based on SCr of 0.87 mg/dL). Liver Function Tests: Recent Labs  Lab 04/08/23 0627  AST 31  ALT 14  ALKPHOS 34*  BILITOT 1.2  PROT 6.8  ALBUMIN 3.5   No results for input(s): "LIPASE", "AMYLASE" in the last 168 hours. No results for input(s): "AMMONIA" in the last 168 hours. Coagulation Profile: Recent Labs  Lab 04/07/23 2313  INR 1.7*   Cardiac Enzymes: No results for input(s): "CKTOTAL", "CKMB", "CKMBINDEX", "TROPONINI" in the last 168 hours. BNP (last 3 results) No results for input(s): "PROBNP" in the last 8760 hours. HbA1C: No results for input(s): "HGBA1C" in the last 72 hours. CBG: Recent Labs  Lab 04/09/23 0802 04/11/23 0821  GLUCAP 128* 129*   Lipid Profile: No results for input(s): "CHOL", "HDL", "LDLCALC", "TRIG", "CHOLHDL", "LDLDIRECT" in the last 72 hours. Thyroid Function Tests: No results for input(s): "TSH", "T4TOTAL", "FREET4", "T3FREE", "THYROIDAB" in the last 72 hours. Anemia Panel: No results for input(s): "VITAMINB12", "FOLATE", "FERRITIN", "TIBC", "IRON", "RETICCTPCT" in the last 72 hours. Most Recent Urinalysis On File:     Component Value Date/Time   COLORURINE STRAW (A) 03/02/2022 1117   APPEARANCEUR CLEAR (A) 03/02/2022 1117   LABSPEC 1.008 03/02/2022 1117   PHURINE 5.0 03/02/2022 1117   GLUCOSEU NEGATIVE 03/02/2022 1117   HGBUR SMALL (A) 03/02/2022 1117   BILIRUBINUR NEGATIVE 03/02/2022 1117   KETONESUR NEGATIVE 03/02/2022 1117   PROTEINUR NEGATIVE 03/02/2022 1117   NITRITE NEGATIVE 03/02/2022 1117   LEUKOCYTESUR NEGATIVE 03/02/2022 1117   Sepsis Labs: @LABRCNTIP (procalcitonin:4,lacticidven:4) Microbiology: Recent Results (from the past 240 hour(s))  Culture, blood (Routine X 2) w Reflex to ID Panel     Status:  None (  Preliminary result)   Collection Time: 04/07/23 11:13 PM   Specimen: BLOOD RIGHT ARM  Result Value Ref Range Status   Specimen Description BLOOD RIGHT ARM  Final   Special Requests   Final    BOTTLES DRAWN AEROBIC AND ANAEROBIC Blood Culture adequate volume   Culture   Final    NO GROWTH 4 DAYS Performed at [facility], [address] [place], [place] [address]    Report Status PENDING  Incomplete  Culture, blood (Routine X 2) w Reflex to ID Panel     Status: None (Preliminary result)   Collection Time: 04/07/23 11:14 PM   Specimen: BLOOD RIGHT HAND  Result Value Ref Range Status   Specimen Description BLOOD RIGHT HAND  Final   Special Requests   Final    BOTTLES DRAWN AEROBIC AND ANAEROBIC Blood Culture adequate volume   Culture   Final    NO GROWTH 4 DAYS Performed at [facility], [address] [place], [place] [address]    Report Status PENDING  Incomplete  Pleural fluid culture w Gram Stain     Status: None (Preliminary result)   Collection Time: 04/09/23  3:30 PM   Specimen: Pleural Fluid  Result Value Ref Range Status   Specimen Description   Final    PLEURAL Performed at [facility], [address] [place], [place] [address]    Special Requests   Final    NONE Performed at [facility], [address] [place], [place] [address]    Gram Stain   Final    RARE WBC PRESENT, PREDOMINANTLY PMN NO ORGANISMS SEEN    Culture   Final    NO GROWTH 2 DAYS Performed at [facility], [address] [place], [place] [address]    Report Status PENDING  Incomplete      Radiology Studies last 3 days: DG Chest Port 1 View  Result Date: 04/11/2023 CLINICAL DATA:  Pleural effusion. EXAM: PORTABLE CHEST 1 VIEW COMPARISON:  April 10, 2023. FINDINGS: The heart size and mediastinal contours are within normal limits. Status post left shoulder arthroplasty. Old left rib fractures are noted. Increased right basilar opacity  is noted concerning for worsening effusion and associated atelectasis or infiltrate. Left lung is unremarkable. IMPRESSION: Increased right basilar opacity is noted concerning for worsening effusion and associated atelectasis or infiltrate. Electronically Signed   By: Lupita Raider M.D.   On: 04/11/2023 08:51   ECHOCARDIOGRAM COMPLETE  Result Date: 04/10/2023    ECHOCARDIOGRAM REPORT   Patient Name:   [name] Date of Exam: 04/09/2023 Medical Rec #:  [id number]      Height:       62.0 in Accession #:    [id number]     Weight:       141.5 lb Date of Birth:  04/06/1946       BSA:          1.650 m Patient Age:    77 years       BP:           121/76 mmHg Patient Gender: F              HR:           58 bpm. Exam Location:  [facility] Procedure: 2D Echo, Cardiac Doppler and Color Doppler Indications:    DOE R06.0  History:        Patient has prior history of Echocardiogram examinations, most  recent 05/24/2022. COPD, Arrythmias:Atrial Fibrillation; Risk                 Factors:Hypertension and Diabetes.  Sonographer:    Dondra Prader RVT RCS Referring Phys: [id number] Sunnie Nielsen  Sonographer Comments: Patient unable to tolerate probe pressure on abdomen; no subcostal views. IMPRESSIONS  1. Left ventricular ejection fraction, by estimation, is 55 to 60%. The left ventricle has normal function. The left ventricle has no regional wall motion abnormalities. Left ventricular diastolic parameters are consistent with Grade I diastolic dysfunction (impaired relaxation).  2. Right ventricular systolic function is normal. The right ventricular size is normal. There is normal pulmonary artery systolic pressure.  3. Left atrial size was mildly dilated.  4. The mitral valve is normal in structure. Mild mitral valve regurgitation. Mild to moderate mitral stenosis. The mean mitral valve gradient is 6.0 mmHg. Severe mitral annular calcification.  5. The aortic valve is normal in structure. Aortic valve regurgitation  is trivial. No aortic stenosis is present.  6. The inferior vena cava is normal in size with greater than 50% respiratory variability, suggesting right atrial pressure of 3 mmHg. FINDINGS  Left Ventricle: Left ventricular ejection fraction, by estimation, is 55 to 60%. The left ventricle has normal function. The left ventricle has no regional wall motion abnormalities. The left ventricular internal cavity size was normal in size. There is  no left ventricular hypertrophy. Left ventricular diastolic parameters are consistent with Grade I diastolic dysfunction (impaired relaxation). Right Ventricle: The right ventricular size is normal. No increase in right ventricular wall thickness. Right ventricular systolic function is normal. There is normal pulmonary artery systolic pressure. The tricuspid regurgitant velocity is 2.31 m/s, and  with an assumed right atrial pressure of 3 mmHg, the estimated right ventricular systolic pressure is 24.3 mmHg. Left Atrium: Left atrial size was mildly dilated. Right Atrium: Right atrial size was normal in size. Pericardium: There is no evidence of pericardial effusion. Mitral Valve: The mitral valve is normal in structure. There is moderate thickening of the mitral valve leaflet(s). There is moderate calcification of the mitral valve leaflet(s). Severe mitral annular calcification. Mild mitral valve regurgitation. Mild  to moderate mitral valve stenosis. The mean mitral valve gradient is 6.0 mmHg. Tricuspid Valve: The tricuspid valve is normal in structure. Tricuspid valve regurgitation is mild . No evidence of tricuspid stenosis. Aortic Valve: The aortic valve is normal in structure. Aortic valve regurgitation is trivial. Aortic regurgitation PHT measures 600 msec. No aortic stenosis is present. Aortic valve mean gradient measures 7.0 mmHg. Aortic valve peak gradient measures 12.7 mmHg. Aortic valve area, by VTI measures 1.18 cm. Pulmonic Valve: The pulmonic valve was normal in  structure. Pulmonic valve regurgitation is not visualized. No evidence of pulmonic stenosis. Aorta: The aortic root is normal in size and structure. Venous: The inferior vena cava is normal in size with greater than 50% respiratory variability, suggesting right atrial pressure of 3 mmHg. IAS/Shunts: No atrial level shunt detected by color flow Doppler.  LEFT VENTRICLE PLAX 2D LVIDd:         4.30 cm   Diastology LVIDs:         3.10 cm   LV e' medial:    4.56 cm/s LV PW:         1.00 cm   LV E/e' medial:  24.6 LV IVS:        0.80 cm   LV e' lateral:   4.87 cm/s LVOT diam:  1.40 cm   LV E/e' lateral: 23.0 LV SV:         43 LV SV Index:   26 LVOT Area:     1.54 cm  RIGHT VENTRICLE RV Basal diam:  2.60 cm RV S prime:     11.30 cm/s TAPSE (M-mode): 1.7 cm LEFT ATRIUM             Index        RIGHT ATRIUM          Index LA diam:        4.40 cm 2.67 cm/m   RA Area:     7.81 cm LA Vol (A2C):   50.9 ml 30.84 ml/m  RA Volume:   13.80 ml 8.36 ml/m LA Vol (A4C):   42.4 ml 25.69 ml/m LA Biplane Vol: 49.3 ml 29.87 ml/m  AORTIC VALVE                     PULMONIC VALVE AV Area (Vmax):    1.37 cm      PV Vmax:       1.06 m/s AV Area (Vmean):   1.26 cm      PV Peak grad:  4.5 mmHg AV Area (VTI):     1.18 cm AV Vmax:           178.00 cm/s AV Vmean:          116.000 cm/s AV VTI:            0.369 m AV Peak Grad:      12.7 mmHg AV Mean Grad:      7.0 mmHg LVOT Vmax:         158.00 cm/s LVOT Vmean:        94.800 cm/s LVOT VTI:          0.282 m LVOT/AV VTI ratio: 0.76 AI PHT:            600 msec  AORTA Ao Root diam: 2.80 cm Ao Asc diam:  3.60 cm MITRAL VALVE                TRICUSPID VALVE MV Area (PHT): 2.07 cm     TR Peak grad:   21.3 mmHg MV Mean grad:  6.0 mmHg     TR Vmax:        231.00 cm/s MV Decel Time: 366 msec MR Peak grad: 11.8 mmHg     SHUNTS MR Mean grad: 6.0 mmHg      Systemic VTI:  0.28 m MR Vmax:      172.00 cm/s   Systemic Diam: 1.40 cm MR Vmean:     116.0 cm/s MV E velocity: 112.00 cm/s MV A velocity: 167.00  cm/s MV E/A ratio:  0.67 Lorine Bears MD Electronically signed by Lorine Bears MD Signature Date/Time: 04/10/2023/5:17:03 PM    Final    DG Chest Port 1 View  Result Date: 04/10/2023 CLINICAL DATA:  142230 Pleural effusion 142230. EXAM: PORTABLE CHEST 1 VIEW COMPARISON:  Chest radiograph 04/09/2023. FINDINGS: Decreased, trace residual right pleural effusion. Increasing airspace opacity in the medial aspect of the right lung base, suspicious for aspiration or developing infection. No pneumothorax. IMPRESSION: Decreased, trace residual right pleural effusion. Increasing airspace opacity in the medial aspect of the right lung base, suspicious for aspiration or developing infection. Electronically Signed   By: Orvan Falconer M.D.   On: 04/10/2023 12:47   DG Chest Port 1 View  Result Date: 04/09/2023 CLINICAL  DATA:  Post thoracentesis EXAM: PORTABLE CHEST 1 VIEW COMPARISON:  CT chest 04/07/2023, chest x-ray 05/22/2022 FINDINGS: Left-sided electronic recording device. Normal cardiac size. Mitral annular calcifications. Left lung grossly clear. Decreased right pleural effusion with small moderate residual pleural effusion. No convincing right pneumothorax. Airspace disease at the right base. Aortic atherosclerosis. Old appearing left-sided rib fractures. Left shoulder replacement IMPRESSION: Decreased right pleural effusion with small to moderate residual pleural effusion. No convincing pneumothorax. Airspace disease at the right base, atelectasis versus pneumonia. Electronically Signed   By: Jasmine Pang M.D.   On: 04/09/2023 16:27   US Abdomen Limited RUQ (LIVER/GB)  Result Date: 04/09/2023 CLINICAL DATA:  Cirrhosis EXAM: ULTRASOUND ABDOMEN LIMITED RIGHT UPPER QUADRANT COMPARISON:  None Available. FINDINGS: Gallbladder: Prior cholecystectomy. Common bile duct: Diameter: 6 mm. Liver: No focal lesion identified. Nodular liver contour. Normal parenchymal echogenicity. Portal vein is patent on color Doppler  imaging with normal direction of blood flow towards the liver. Other: Right pleural effusion. IMPRESSION: 1. Cirrhotic liver morphology. 2. Right pleural effusion. Electronically Signed   By: Allegra Lai M.D.   On: 04/09/2023 12:59   DG Foot Complete Right  Result Date: 04/08/2023 CLINICAL DATA:  Right foot pain EXAM: RIGHT FOOT COMPLETE - 3+ VIEW COMPARISON:  None Available. FINDINGS: No fracture or dislocation is seen. Mild degenerative changes of the 1st MCP joint. Degenerative changes of the dorsal midfoot. Moderate posterior and plantar enthesophytes. IMPRESSION: No fracture or dislocation is seen. Degenerative changes, as above. Electronically Signed   By: Charline Bills M.D.   On: 04/08/2023 01:55         Sunnie Nielsen, DO [facility] 04/11/2023, 1:58 PM    Dictation software may have been used to generate the above note. Typos may occur and escape review in typed/dictated notes. Please contact Dr Lyn Hollingshead directly for clarity if needed.  Staff may message me via secure chat in Epic  but this may not receive an immediate response,  please page me for urgent matters!  If 7PM-7AM, please contact night coverage [email or web address]

## 2023-04-12 ENCOUNTER — Other Ambulatory Visit: Payer: Self-pay | Admitting: Student in an Organized Health Care Education/Training Program

## 2023-04-12 ENCOUNTER — Ambulatory Visit: Payer: Medicare (Managed Care) | Admitting: Oncology

## 2023-04-12 ENCOUNTER — Inpatient Hospital Stay: Payer: Medicare (Managed Care) | Admitting: Pulmonary Disease

## 2023-04-12 ENCOUNTER — Inpatient Hospital Stay: Payer: Medicare (Managed Care)

## 2023-04-12 DIAGNOSIS — E1122 Type 2 diabetes mellitus with diabetic chronic kidney disease: Secondary | ICD-10-CM | POA: Diagnosis not present

## 2023-04-12 DIAGNOSIS — I10 Essential (primary) hypertension: Secondary | ICD-10-CM

## 2023-04-12 DIAGNOSIS — J90 Pleural effusion, not elsewhere classified: Secondary | ICD-10-CM

## 2023-04-12 DIAGNOSIS — N183 Chronic kidney disease, stage 3 unspecified: Secondary | ICD-10-CM

## 2023-04-12 DIAGNOSIS — N1831 Chronic kidney disease, stage 3a: Secondary | ICD-10-CM | POA: Diagnosis not present

## 2023-04-12 LAB — COMP PANEL: LEUKEMIA/LYMPHOMA: Immunophenotypic Profile: 96

## 2023-04-12 LAB — GLUCOSE, CAPILLARY: Glucose-Capillary: 118 mg/dL — ABNORMAL HIGH (ref 70–99)

## 2023-04-12 LAB — CULTURE, BLOOD (ROUTINE X 2)
Culture: NO GROWTH
Culture: NO GROWTH
Special Requests: ADEQUATE
Special Requests: ADEQUATE

## 2023-04-12 LAB — ACID FAST SMEAR (AFB, MYCOBACTERIA): Acid Fast Smear: NEGATIVE

## 2023-04-12 MED ORDER — METOPROLOL TARTRATE 25 MG PO TABS: 12.5000 mg | ORAL_TABLET | Freq: Two times a day (BID) | ORAL | 0 refills | Status: DC

## 2023-04-12 MED ORDER — AZITHROMYCIN 250 MG PO TABS: 250.0000 mg | ORAL_TABLET | Freq: Every day | ORAL | 0 refills | Status: DC

## 2023-04-12 MED ORDER — AMOXICILLIN-POT CLAVULANATE 875-125 MG PO TABS: 1.0000 | ORAL_TABLET | Freq: Two times a day (BID) | ORAL | 0 refills | Status: DC

## 2023-04-12 MED ORDER — FUROSEMIDE 20 MG PO TABS: 20.0000 mg | ORAL_TABLET | Freq: Two times a day (BID) | ORAL | 0 refills | Status: DC

## 2023-04-12 MED ORDER — AMIODARONE HCL 100 MG PO TABS: 100.0000 mg | ORAL_TABLET | Freq: Every day | ORAL | 0 refills | Status: DC

## 2023-04-12 NOTE — TOC Transition Note (Signed)
Transition of Care [facility]) - CM/SW Discharge Note   Patient Details  Name: [name] MRN: [id number] Date of Birth: 08/15/1945  Transition of [facility]) CM/SW Contact:  Allena Katz, LCSW Phone Number: 04/12/2023, 10:16 AM   Clinical Narrative:   Pt has orders to discharge home with [facility]. [facility] CM robin notified. [facility] will pick up patient around 11-12. [facility] to arrange [facility] for patient. No other needs at this time.     Final next level of care: Home w Home Health Services Barriers to Discharge: Barriers Resolved   Patient Goals and CMS Choice CMS Medicare.gov Compare Post Acute Care list provided to:: Patient    Discharge Placement                  Patient to be transferred to facility by: [facility]      Discharge Plan and Services Additional resources added to the After Visit Summary for                    DME Agency: [employer]       HH Arranged: PT, OT          Social Determinants of Health (SDOH) Interventions SDOH Screenings   Food Insecurity: No Food Insecurity (04/08/2023)  Housing: Low Risk  (04/08/2023)  Transportation Needs: No Transportation Needs (04/08/2023)  Utilities: Not At Risk (04/08/2023)  Tobacco Use: Low Risk  (04/07/2023)     Readmission Risk Interventions    04/10/2023    1:51 PM  Readmission Risk Prevention Plan  Transportation Screening Complete  PCP or Specialist Appt within 3-5 Days Complete  Palliative Care Screening Complete  Medication Review (RN Care Manager) Complete

## 2023-04-12 NOTE — Discharge Summary (Signed)
Physician Discharge Summary   Patient: [name] MRN: [id number] DOB: 10-30-1945  Admit date:     04/07/2023  Discharge date: 04/12/23  Discharge Physician: Alford Highland   PCP: [facility]  Recommendations at discharge:    Follow up [facility] one day  Discharge Diagnoses: Principal Problem:   Pleural effusion on right Active Problems:   HTN (hypertension)   Type II diabetes mellitus with renal manifestations (HCC)   COPD (chronic obstructive pulmonary disease) (HCC)   Hypothyroidism   Chronic kidney disease, stage 3a (HCC)   Atrial fibrillation, chronic (HCC)   DVT (deep venous thrombosis) (HCC)   Right foot pain   Stage 3 chronic renal impairment associated with type 2 diabetes mellitus [facility])    Hospital Course: HPI:  [name] is a 77 y.o. female with medical history significant of a fib and DVT on Eliquis, HTN, diet-controlled diabetes, COPD on 2 L oxygen at night, hypothyroidism, GERD, depression, anemia, CKD-3A, who presents with shortness of breath x several days progressively worsening.  Cough with little mucus production, chest pressure.PCP at [facility] - CT of chest done, moderate layered right pleural effusion.  Patient sent to ED  Hospital course / significant events:  11/01: to ED, admitted to hospitalist service. Thoracentesis pending.  11/02: reports persistent chest tightness and SOB, thoracentesis pending 11/03: thoracentesis today fluid labs pending, echo pending. Wait 24h at least (utnil tomorrow approx 4:00 PM) prior to restart anticoagulation. Pt is set up w/ pulmonary follow up outpatient Weds 11/06 11/04: hypotensive, pending echo, adjusting antihypertensive meds, PT/OT eval pending d/t significant weakness.  11/05: CXR a bit worse on R question effusion/infiltrate, will cover for PNA and increase lasix. Pleural cytology pending   Consultants:  none  Procedures/Surgeries: 04/12/23 thoracentesis w/ Dr Aundria Rud      ASSESSMENT & PLAN:    Pleural effusion on right, etiology concerning for malignancy vs infection vs hepatic Secondhand smoke exposure   S/p thoracentesis 04/09/23 Pleural fluid analysis (+)leukocytosis monocyte predominant, and significant elevation in LDH. DDx infectious etiologies, malignancy.  Pleural Cytology and AFB cultures were sent - awaiting results   Complete antiobiotic course Concern for cirrhosis on CT - hepatic labs normal --> follow outpatient w/ GI  Echo EF 55% Increased home lasix to 20 mg bid     Hx Essential HTN (hypertension) BP soft here but is improving  Holding Enalapril Reduced metoprolol 25 --> 12.5 mg bid  Reduced amiodarone 200 --> 100 mg daily    Diet controlled type II diabetes mellitus with renal manifestations  Recent A1c 7.8, diet controlled.   CBG every morning   COPD (chronic obstructive pulmonary disease), Asthma  Bronchodilators Pt declines inhalers    Hypothyroidism Synthroid   Chronic kidney disease, stage 3a (HCC):  Renal function stable. Last creatinine 0.87   Atrial fibrillation, chronic  Currently sinus rhythm  Metoprolol, amiodarone - reduced dose d/t low BP, see above  Eliquis was intiially held and pt was on IV heparin pending thoracentesis, pt very nervous to restart heparin infusion.  Can go back on eliquis.   Hx DVT (deep venous thrombosis)  Plan eliquis on discharge, holding for now in case other procedures needed - see above    Right foot pain: no fracture Falls at home  Pain control PT/OT   Chest tightness - resolved Anxiety component - family states anxiety worse past few months  On buspar at home  AKI resolved Caution w/ lasix    Overweight based on BMI: Body mass index  is 25.89 kg/m.  Underweight - under 18.5  normal weight - 18.5 to 24.9 overweight - 25 to 29.9 obese - 30 or more  Constipation- resolved.  Advised caution with immodium.   DVT prophylaxis: on heparin sq IV fluids: no continuous IV fluids  Nutrition:  cardiac/carb diet  Central lines / invasive devices: none  Code Status: FULL CODE ACP documentation reviewed: has MOST on file in [facility]  TOC needs: home health  Barriers to dispo / significant pending items:  hopefully diurese some and stabilize overnight, pt is very anxious but if remains stable into tomorrow should be ok for discharge              Consultants: pulmonary Procedures performed: thoracentesis Disposition: Home Diet recommendation:  Cardiac diet DISCHARGE MEDICATION: Allergies as of 04/12/2023       Reactions   Cephalosporins Itching   Codeine Other (See Comments)   Extreme drowsiness   Contrast Media [iodinated Contrast Media] Hives, Swelling   Facial swelling   Oxycodone Other (See Comments)   Extreme drowsiness        Medication List     STOP taking these medications    amoxicillin 500 MG capsule Commonly known as: AMOXIL   enalapril 20 MG tablet Commonly known as: VASOTEC   ibuprofen 200 MG tablet Commonly known as: ADVIL   Mucinex 600 MG 12 hr tablet Generic drug: guaiFENesin       TAKE these medications    acetaminophen 325 MG tablet Commonly known as: TYLENOL Take 1,300 mg by mouth every 6 (six) hours as needed for moderate pain.   Advair HFA 115-21 MCG/ACT inhaler Generic drug: fluticasone-salmeterol Inhale 2 puffs into the lungs 2 (two) times daily.   albuterol (2.5 MG/3ML) 0.083% nebulizer solution Commonly known as: PROVENTIL Take 2.5 mg by nebulization every 4 (four) hours as needed for wheezing or shortness of breath.   albuterol 108 (90 Base) MCG/ACT inhaler Commonly known as: VENTOLIN HFA Inhale 2-4 puffs by mouth every 4 hours as needed for wheezing, cough, and/or shortness of breath   amiodarone 100 MG tablet Commonly known as: PACERONE Take 1 tablet (100 mg total) by mouth daily. What changed:  medication strength See the new instructions.   amoxicillin-clavulanate 875-125 MG tablet Commonly known as:  AUGMENTIN Take 1 tablet by mouth 2 (two) times daily with a meal for 3 days.   apixaban 5 MG Tabs tablet Commonly known as: ELIQUIS Take 1 tablet (5 mg total) by mouth 2 (two) times daily.   atorvastatin 20 MG tablet Commonly known as: LIPITOR Take 20 mg by mouth daily.   azithromycin 250 MG tablet Commonly known as: ZITHROMAX Take 1 tablet (250 mg total) by mouth daily. Start taking on: April 13, 2023   busPIRone 5 MG tablet Commonly known as: BUSPAR Take 5 mg by mouth 3 (three) times daily as needed.   esomeprazole 40 MG capsule Commonly known as: NEXIUM Take 40 mg by mouth 2 (two) times daily.   eucerin lotion Apply 1 Application topically as needed for dry skin.   ferrous sulfate 325 (65 FE) MG tablet Take 325 mg by mouth daily.   fluticasone 50 MCG/ACT nasal spray Commonly known as: FLONASE Place 2 sprays into both nostrils daily as needed for allergies or rhinitis.   furosemide 20 MG tablet Commonly known as: LASIX Take 1 tablet (20 mg total) by mouth 2 (two) times daily with breakfast and lunch. What changed: when to take this   ipratropium-albuterol 0.5-2.5 (  3) MG/3ML Soln Commonly known as: DUONEB Take 3 mLs by nebulization every 4 (four) hours as needed.   ketoconazole 2 % shampoo Commonly known as: NIZORAL Apply 1 Application topically 2 (two) times a week.   ketotifen 0.025 % ophthalmic solution Commonly known as: ZADITOR Place 1 drop into both eyes daily as needed.   levothyroxine 125 MCG tablet Commonly known as: SYNTHROID Take 125 mcg by mouth daily before breakfast.   loperamide 2 MG capsule Commonly known as: IMODIUM Take 2 mg by mouth as needed for diarrhea or loose stools.   loratadine 10 MG tablet Commonly known as: CLARITIN Take 1 tablet (10 mg total) by mouth daily.   Magnesium 250 MG Tabs Take 1 tablet by mouth daily.   metoprolol tartrate 25 MG tablet Commonly known as: LOPRESSOR Take 1 tablet (25 mg total) by mouth 2 (two)  times daily. What changed: Another medication with the same name was added. Make sure you understand how and when to take each.   metoprolol tartrate 25 MG tablet Commonly known as: LOPRESSOR Take 0.5 tablets (12.5 mg total) by mouth 2 (two) times daily. What changed: You were already taking a medication with the same name, and this prescription was added. Make sure you understand how and when to take each.   Omega-3 1000 MG Caps Take 1 capsule by mouth daily.   OXYGEN Inhale 2 L into the lungs at bedtime.   potassium chloride 10 MEQ tablet Commonly known as: KLOR-CON M Take 10 mEq by mouth daily.   potassium chloride 10 MEQ tablet Commonly known as: KLOR-CON Take 10 mEq by mouth 2 (two) times daily.   Refresh Optive Advanced 0.5-1-0.5 % Soln Generic drug: Carboxymeth-Glycerin-Polysorb Apply 2 drops to eye daily.   rOPINIRole 0.25 MG tablet Commonly known as: REQUIP Take 0.25 mg by mouth 2 (two) times daily.   sodium chloride 0.65 % Soln nasal spray Commonly known as: OCEAN Place 1 spray into both nostrils as needed for congestion.   Systane Ultra 0.4-0.3 % Soln Generic drug: Polyethyl Glycol-Propyl Glycol Apply to eye.   traMADol 50 MG tablet Commonly known as: ULTRAM Take 50 mg by mouth every 6 (six) hours as needed.   traZODone 50 MG tablet Commonly known as: DESYREL Take 50 mg by mouth at bedtime as needed for sleep.   TURMERIC PO Take 1 capsule by mouth daily.   VITAMIN C CR 1500 MG Tbcr Take 1 tablet by mouth daily.   VITAMIN D-VITAMIN K PO Take 4,000 Units by mouth daily.        Follow-up Information     [facility] Follow up in 1 day(s).                 Discharge Exam: Filed Weights   04/07/23 1705 04/08/23 1727  Weight: 64.4 kg 64.2 kg   Physical Exam HENT:     Head: Normocephalic.     Mouth/Throat:     Pharynx: No oropharyngeal exudate.  Eyes:     General: Lids are normal.     Conjunctiva/sclera: Conjunctivae normal.   Cardiovascular:     Rate and Rhythm: Normal rate and regular rhythm.     Heart sounds: Normal heart sounds, S1 normal and S2 normal.  Pulmonary:     Breath sounds: Examination of the right-lower field reveals decreased breath sounds. Decreased breath sounds present. No wheezing, rhonchi or rales.  Abdominal:     Palpations: Abdomen is soft.     Tenderness: There is  no abdominal tenderness.  Musculoskeletal:     Right lower leg: No swelling.     Left lower leg: No swelling.  Skin:    General: Skin is warm.     Findings: No rash.  Neurological:     Mental Status: She is alert and oriented to person, place, and time.      Condition at discharge: stable  The results of significant diagnostics from this hospitalization (including imaging, microbiology, ancillary and laboratory) are listed below for reference.   Imaging Studies: DG Chest Port 1 View  Result Date: 04/12/2023 CLINICAL DATA:  Cough, shortness of breath EXAM: PORTABLE CHEST 1 VIEW COMPARISON:  04/11/2023 FINDINGS: Heart size is normal. Dense mitral annulus calcifications. Benign calcified granulomatous mediastinal lymph nodes. Implantable loop recorder. No acute airspace opacity. Benign calcified nodule specific status post left shoulder reverse arthroplasty. IMPRESSION: No acute abnormality of the lungs in AP portable projection. Electronically Signed   By: Jearld Lesch M.D.   On: 04/12/2023 09:10   DG Chest Port 1 View  Result Date: 04/11/2023 CLINICAL DATA:  Pleural effusion. EXAM: PORTABLE CHEST 1 VIEW COMPARISON:  April 10, 2023. FINDINGS: The heart size and mediastinal contours are within normal limits. Status post left shoulder arthroplasty. Old left rib fractures are noted. Increased right basilar opacity is noted concerning for worsening effusion and associated atelectasis or infiltrate. Left lung is unremarkable. IMPRESSION: Increased right basilar opacity is noted concerning for worsening effusion and associated  atelectasis or infiltrate. Electronically Signed   By: Lupita Raider M.D.   On: 04/11/2023 08:51   ECHOCARDIOGRAM COMPLETE  Result Date: 04/10/2023    ECHOCARDIOGRAM REPORT   Patient Name:   TABITHIA STRODER Date of Exam: 04/09/2023 Medical Rec #:  [id number]      Height:       62.0 in Accession #:    [id number]     Weight:       141.5 lb Date of Birth:  1946/05/27       BSA:          1.650 m Patient Age:    77 years       BP:           121/76 mmHg Patient Gender: F              HR:           58 bpm. Exam Location:  [facility] Procedure: 2D Echo, Cardiac Doppler and Color Doppler Indications:    DOE R06.0  History:        Patient has prior history of Echocardiogram examinations, most                 recent 05/24/2022. COPD, Arrythmias:Atrial Fibrillation; Risk                 Factors:Hypertension and Diabetes.  Sonographer:    Dondra Prader RVT RCS Referring Phys: [id number] Sunnie Nielsen  Sonographer Comments: Patient unable to tolerate probe pressure on abdomen; no subcostal views. IMPRESSIONS  1. Left ventricular ejection fraction, by estimation, is 55 to 60%. The left ventricle has normal function. The left ventricle has no regional wall motion abnormalities. Left ventricular diastolic parameters are consistent with Grade I diastolic dysfunction (impaired relaxation).  2. Right ventricular systolic function is normal. The right ventricular size is normal. There is normal pulmonary artery systolic pressure.  3. Left atrial size was mildly dilated.  4. The mitral valve is normal in structure. Mild mitral valve regurgitation. Mild to  moderate mitral stenosis. The mean mitral valve gradient is 6.0 mmHg. Severe mitral annular calcification.  5. The aortic valve is normal in structure. Aortic valve regurgitation is trivial. No aortic stenosis is present.  6. The inferior vena cava is normal in size with greater than 50% respiratory variability, suggesting right atrial pressure of 3 mmHg. FINDINGS  Left Ventricle: Left  ventricular ejection fraction, by estimation, is 55 to 60%. The left ventricle has normal function. The left ventricle has no regional wall motion abnormalities. The left ventricular internal cavity size was normal in size. There is  no left ventricular hypertrophy. Left ventricular diastolic parameters are consistent with Grade I diastolic dysfunction (impaired relaxation). Right Ventricle: The right ventricular size is normal. No increase in right ventricular wall thickness. Right ventricular systolic function is normal. There is normal pulmonary artery systolic pressure. The tricuspid regurgitant velocity is 2.31 m/s, and  with an assumed right atrial pressure of 3 mmHg, the estimated right ventricular systolic pressure is 24.3 mmHg. Left Atrium: Left atrial size was mildly dilated. Right Atrium: Right atrial size was normal in size. Pericardium: There is no evidence of pericardial effusion. Mitral Valve: The mitral valve is normal in structure. There is moderate thickening of the mitral valve leaflet(s). There is moderate calcification of the mitral valve leaflet(s). Severe mitral annular calcification. Mild mitral valve regurgitation. Mild  to moderate mitral valve stenosis. The mean mitral valve gradient is 6.0 mmHg. Tricuspid Valve: The tricuspid valve is normal in structure. Tricuspid valve regurgitation is mild . No evidence of tricuspid stenosis. Aortic Valve: The aortic valve is normal in structure. Aortic valve regurgitation is trivial. Aortic regurgitation PHT measures 600 msec. No aortic stenosis is present. Aortic valve mean gradient measures 7.0 mmHg. Aortic valve peak gradient measures 12.7 mmHg. Aortic valve area, by VTI measures 1.18 cm. Pulmonic Valve: The pulmonic valve was normal in structure. Pulmonic valve regurgitation is not visualized. No evidence of pulmonic stenosis. Aorta: The aortic root is normal in size and structure. Venous: The inferior vena cava is normal in size with greater  than 50% respiratory variability, suggesting right atrial pressure of 3 mmHg. IAS/Shunts: No atrial level shunt detected by color flow Doppler.  LEFT VENTRICLE PLAX 2D LVIDd:         4.30 cm   Diastology LVIDs:         3.10 cm   LV e' medial:    4.56 cm/s LV PW:         1.00 cm   LV E/e' medial:  24.6 LV IVS:        0.80 cm   LV e' lateral:   4.87 cm/s LVOT diam:     1.40 cm   LV E/e' lateral: 23.0 LV SV:         43 LV SV Index:   26 LVOT Area:     1.54 cm  RIGHT VENTRICLE RV Basal diam:  2.60 cm RV S prime:     11.30 cm/s TAPSE (M-mode): 1.7 cm LEFT ATRIUM             Index        RIGHT ATRIUM          Index LA diam:        4.40 cm 2.67 cm/m   RA Area:     7.81 cm LA Vol (A2C):   50.9 ml 30.84 ml/m  RA Volume:   13.80 ml 8.36 ml/m LA Vol (A4C):   42.4 ml 25.69  ml/m LA Biplane Vol: 49.3 ml 29.87 ml/m  AORTIC VALVE                     PULMONIC VALVE AV Area (Vmax):    1.37 cm      PV Vmax:       1.06 m/s AV Area (Vmean):   1.26 cm      PV Peak grad:  4.5 mmHg AV Area (VTI):     1.18 cm AV Vmax:           178.00 cm/s AV Vmean:          116.000 cm/s AV VTI:            0.369 m AV Peak Grad:      12.7 mmHg AV Mean Grad:      7.0 mmHg LVOT Vmax:         158.00 cm/s LVOT Vmean:        94.800 cm/s LVOT VTI:          0.282 m LVOT/AV VTI ratio: 0.76 AI PHT:            600 msec  AORTA Ao Root diam: 2.80 cm Ao Asc diam:  3.60 cm MITRAL VALVE                TRICUSPID VALVE MV Area (PHT): 2.07 cm     TR Peak grad:   21.3 mmHg MV Mean grad:  6.0 mmHg     TR Vmax:        231.00 cm/s MV Decel Time: 366 msec MR Peak grad: 11.8 mmHg     SHUNTS MR Mean grad: 6.0 mmHg      Systemic VTI:  0.28 m MR Vmax:      172.00 cm/s   Systemic Diam: 1.40 cm MR Vmean:     116.0 cm/s MV E velocity: 112.00 cm/s MV A velocity: 167.00 cm/s MV E/A ratio:  0.67 Lorine Bears MD Electronically signed by Lorine Bears MD Signature Date/Time: 04/10/2023/5:17:03 PM    Final    DG Chest Port 1 View  Result Date: 04/10/2023 CLINICAL DATA:  142230  Pleural effusion 142230. EXAM: PORTABLE CHEST 1 VIEW COMPARISON:  Chest radiograph 04/09/2023. FINDINGS: Decreased, trace residual right pleural effusion. Increasing airspace opacity in the medial aspect of the right lung base, suspicious for aspiration or developing infection. No pneumothorax. IMPRESSION: Decreased, trace residual right pleural effusion. Increasing airspace opacity in the medial aspect of the right lung base, suspicious for aspiration or developing infection. Electronically Signed   By: Orvan Falconer M.D.   On: 04/10/2023 12:47   DG Chest Port 1 View  Result Date: 04/09/2023 CLINICAL DATA:  Post thoracentesis EXAM: PORTABLE CHEST 1 VIEW COMPARISON:  CT chest 04/07/2023, chest x-ray 05/22/2022 FINDINGS: Left-sided electronic recording device. Normal cardiac size. Mitral annular calcifications. Left lung grossly clear. Decreased right pleural effusion with small moderate residual pleural effusion. No convincing right pneumothorax. Airspace disease at the right base. Aortic atherosclerosis. Old appearing left-sided rib fractures. Left shoulder replacement IMPRESSION: Decreased right pleural effusion with small to moderate residual pleural effusion. No convincing pneumothorax. Airspace disease at the right base, atelectasis versus pneumonia. Electronically Signed   By: Jasmine Pang M.D.   On: 04/09/2023 16:27   US Abdomen Limited RUQ (LIVER/GB)  Result Date: 04/09/2023 CLINICAL DATA:  Cirrhosis EXAM: ULTRASOUND ABDOMEN LIMITED RIGHT UPPER QUADRANT COMPARISON:  None Available. FINDINGS: Gallbladder: Prior cholecystectomy. Common bile duct: Diameter: 6 mm. Liver: No  focal lesion identified. Nodular liver contour. Normal parenchymal echogenicity. Portal vein is patent on color Doppler imaging with normal direction of blood flow towards the liver. Other: Right pleural effusion. IMPRESSION: 1. Cirrhotic liver morphology. 2. Right pleural effusion. Electronically Signed   By: Allegra Lai M.D.    On: 04/09/2023 12:59   DG Foot Complete Right  Result Date: 04/08/2023 CLINICAL DATA:  Right foot pain EXAM: RIGHT FOOT COMPLETE - 3+ VIEW COMPARISON:  None Available. FINDINGS: No fracture or dislocation is seen. Mild degenerative changes of the 1st MCP joint. Degenerative changes of the dorsal midfoot. Moderate posterior and plantar enthesophytes. IMPRESSION: No fracture or dislocation is seen. Degenerative changes, as above. Electronically Signed   By: Charline Bills M.D.   On: 04/08/2023 01:55   CT CHEST WO CONTRAST  Result Date: 04/07/2023 CLINICAL DATA:  Chest pain, shortness of breath EXAM: CT CHEST WITHOUT CONTRAST TECHNIQUE: Multidetector CT imaging of the chest was performed following the standard protocol without IV contrast. RADIATION DOSE REDUCTION: This exam was performed according to the departmental dose-optimization program which includes automated exposure control, adjustment of the mA and/or kV according to patient size and/or use of iterative reconstruction technique. COMPARISON:  CT 05/23/2022 FINDINGS: Cardiovascular: Heart size is upper limits of normal. No pericardial effusion. Thoracic aorta is nonaneurysmal. There are atherosclerotic vascular calcifications of the aorta and coronary arteries. Calcification of the mitral annulus. Central pulmonary vasculature is nondilated. Mediastinum/Nodes: No enlarged mediastinal or axillary lymph nodes. Thyroid gland, trachea, and esophagus demonstrate no significant findings. Small hiatal hernia. Lungs/Pleura: Moderate-large layering right-sided pleural effusion with complete atelectasis of the right lower lobe and near-complete atelectasis of the right middle lobe. Partial compressive atelectasis of the right upper lobe. Mild mosaic attenuation of the aerated portion of the right upper lobe and within the left lung. No left-sided pleural effusion. No pneumothorax. Upper Abdomen: Nodular hepatic surface contour. No acute abnormality.  Musculoskeletal: No chest wall mass or suspicious bone lesions identified. Left chest wall loop recorder. IMPRESSION: 1. Moderate-large layering right-sided pleural effusion with complete atelectasis of the right lower lobe and near-complete atelectasis of the right middle lobe. Partial compressive atelectasis of the right upper lobe. 2. Mild mosaic attenuation of the aerated portion of the right upper lobe and within the left lung, which may be related to small airways disease. 3. Nodular hepatic surface contour, suggestive of cirrhosis. 4. Aortic and coronary artery atherosclerosis (ICD10-I70.0). Electronically Signed   By: Duanne Guess D.O.   On: 04/07/2023 15:25    Microbiology: Results for orders placed or performed during the hospital encounter of 04/07/23  Culture, blood (Routine X 2) w Reflex to ID Panel     Status: None   Collection Time: 04/07/23 11:13 PM   Specimen: BLOOD RIGHT ARM  Result Value Ref Range Status   Specimen Description BLOOD RIGHT ARM  Final   Special Requests   Final    BOTTLES DRAWN AEROBIC AND ANAEROBIC Blood Culture adequate volume   Culture   Final    NO GROWTH 5 DAYS Performed at [facility], [address] [place], [place] [address]    Report Status 04/12/2023 FINAL  Final  Culture, blood (Routine X 2) w Reflex to ID Panel     Status: None   Collection Time: 04/07/23 11:14 PM   Specimen: BLOOD RIGHT HAND  Result Value Ref Range Status   Specimen Description BLOOD RIGHT HAND  Final   Special Requests   Final    BOTTLES  DRAWN AEROBIC AND ANAEROBIC Blood Culture adequate volume   Culture   Final    NO GROWTH 5 DAYS Performed at [facility], [address] [place] [place], [place] [address]    Report Status 04/12/2023 FINAL  Final  Pleural fluid culture w Gram Stain     Status: None (Preliminary result)   Collection Time: 04/09/23  3:30 PM   Specimen: Pleural Fluid  Result Value Ref Range Status   Specimen Description   Final     PLEURAL Performed at [facility], [address], [place], [place] [address]    Special Requests   Final    NONE Performed at [facility], [address] [place], [place] [address]    Gram Stain   Final    RARE WBC PRESENT, PREDOMINANTLY PMN NO ORGANISMS SEEN    Culture   Final    NO GROWTH 2 DAYS Performed at [facility], [address] [place], [place] [address]    Report Status PENDING  Incomplete    Labs: CBC: Recent Labs  Lab 04/07/23 1806 04/08/23 0627 04/09/23 0459 04/10/23 0454  WBC 14.3* 11.2* 8.9 8.1  NEUTROABS 10.7*  --   --   --   HGB 13.7 12.8 13.1 13.4  HCT 40.2 38.6 40.8 41.0  MCV 90.5 93.2 95.1 92.3  PLT 240 205 209 175   Basic Metabolic Panel: Recent Labs  Lab 04/07/23 1806 04/08/23 0627 04/10/23 0454 04/11/23 0549  NA 136 136 136 132*  K 4.2 4.4 4.1 3.6  CL 102 103 100 97*  CO2 23 23 27 25   GLUCOSE 111* 106* 121* 180*  BUN 33* 36* 40* 27*  CREATININE 1.02* 1.07* 1.13* 0.87  CALCIUM 9.1 8.6* 8.8* 8.5*   Liver Function Tests: Recent Labs  Lab 04/08/23 0627  AST 31  ALT 14  ALKPHOS 34*  BILITOT 1.2  PROT 6.8  ALBUMIN 3.5   CBG: Recent Labs  Lab 04/09/23 0802 04/11/23 0821 04/12/23 0843  GLUCAP 128* 129* 118*    Discharge time spent: greater than 30 minutes.  Signed: Alford Highland, MD [facility] 04/12/2023

## 2023-04-12 NOTE — Care Management Important Message (Signed)
Important Message  Patient Details  Name: [name] MRN: [id number] Date of Birth: 1946-05-02   Important Message Given:  Yes - Medicare IM     [name] 04/12/2023, 9:10 AM

## 2023-04-12 NOTE — Plan of Care (Signed)

## 2023-04-13 ENCOUNTER — Inpatient Hospital Stay: Payer: Medicare (Managed Care)

## 2023-04-13 ENCOUNTER — Inpatient Hospital Stay: Payer: Medicare (Managed Care) | Attending: Oncology | Admitting: Oncology

## 2023-04-13 ENCOUNTER — Encounter: Payer: Self-pay | Admitting: Oncology

## 2023-04-13 ENCOUNTER — Telehealth: Payer: Self-pay

## 2023-04-13 VITALS — BP 108/57 | HR 69 | Temp 97.6°F | Resp 18 | Ht 62.0 in | Wt 139.9 lb

## 2023-04-13 DIAGNOSIS — E1122 Type 2 diabetes mellitus with diabetic chronic kidney disease: Secondary | ICD-10-CM | POA: Insufficient documentation

## 2023-04-13 DIAGNOSIS — J90 Pleural effusion, not elsewhere classified: Secondary | ICD-10-CM | POA: Insufficient documentation

## 2023-04-13 DIAGNOSIS — K7460 Unspecified cirrhosis of liver: Secondary | ICD-10-CM | POA: Insufficient documentation

## 2023-04-13 DIAGNOSIS — D72820 Lymphocytosis (symptomatic): Secondary | ICD-10-CM | POA: Diagnosis not present

## 2023-04-13 DIAGNOSIS — R634 Abnormal weight loss: Secondary | ICD-10-CM | POA: Diagnosis not present

## 2023-04-13 DIAGNOSIS — F39 Unspecified mood [affective] disorder: Secondary | ICD-10-CM | POA: Diagnosis not present

## 2023-04-13 DIAGNOSIS — N183 Chronic kidney disease, stage 3 unspecified: Secondary | ICD-10-CM

## 2023-04-13 LAB — CBC WITH DIFFERENTIAL/PLATELET
Abs Immature Granulocytes: 0.16 10*3/uL — ABNORMAL HIGH (ref 0.00–0.07)
Basophils Absolute: 0.1 10*3/uL (ref 0.0–0.1)
Basophils Relative: 1 %
Eosinophils Absolute: 0.1 10*3/uL (ref 0.0–0.5)
Eosinophils Relative: 1 %
HCT: 39.6 % (ref 36.0–46.0)
Hemoglobin: 13.1 g/dL (ref 12.0–15.0)
Immature Granulocytes: 2 %
Lymphocytes Relative: 17 %
Lymphs Abs: 1.7 10*3/uL (ref 0.7–4.0)
MCH: 30.5 pg (ref 26.0–34.0)
MCHC: 33.1 g/dL (ref 30.0–36.0)
MCV: 92.3 fL (ref 80.0–100.0)
Monocytes Absolute: 0.6 10*3/uL (ref 0.1–1.0)
Monocytes Relative: 6 %
Neutro Abs: 7.7 10*3/uL (ref 1.7–7.7)
Neutrophils Relative %: 73 %
Platelets: 181 10*3/uL (ref 150–400)
RBC: 4.29 MIL/uL (ref 3.87–5.11)
RDW: 13.2 % (ref 11.5–15.5)
WBC: 10.3 10*3/uL (ref 4.0–10.5)
nRBC: 0 % (ref 0.0–0.2)

## 2023-04-13 LAB — COMPREHENSIVE METABOLIC PANEL
ALT: 20 U/L (ref 0–44)
AST: 24 U/L (ref 15–41)
Albumin: 3.8 g/dL (ref 3.5–5.0)
Alkaline Phosphatase: 42 U/L (ref 38–126)
Anion gap: 12 (ref 5–15)
BUN: 24 mg/dL — ABNORMAL HIGH (ref 8–23)
CO2: 26 mmol/L (ref 22–32)
Calcium: 8.8 mg/dL — ABNORMAL LOW (ref 8.9–10.3)
Chloride: 94 mmol/L — ABNORMAL LOW (ref 98–111)
Creatinine, Ser: 0.82 mg/dL (ref 0.44–1.00)
GFR, Estimated: >60 mL/min (ref >60)
Glucose, Bld: 153 mg/dL — ABNORMAL HIGH (ref 70–99)
Potassium: 3.8 mmol/L (ref 3.5–5.1)
Sodium: 132 mmol/L — ABNORMAL LOW (ref 135–145)
Total Bilirubin: 0.3 mg/dL (ref <1.2)
Total Protein: 7.3 g/dL (ref 6.5–8.1)

## 2023-04-13 LAB — HEPATITIS PANEL, ACUTE
HCV Ab: NONREACTIVE
Hep A IgM: NONREACTIVE
Hep B C IgM: NONREACTIVE
Hepatitis B Surface Ag: NONREACTIVE

## 2023-04-13 LAB — HIV ANTIBODY (ROUTINE TESTING W REFLEX): HIV Screen 4th Generation wRfx: NONREACTIVE

## 2023-04-13 LAB — BODY FLUID CULTURE W GRAM STAIN: Culture: NO GROWTH

## 2023-04-13 LAB — CYTOLOGY - NON PAP

## 2023-04-13 LAB — RETIC PANEL
Immature Retic Fract: 8.6 % (ref 2.3–15.9)
RBC.: 4.32 MIL/uL (ref 3.87–5.11)
Retic Count, Absolute: 78.6 10*3/uL (ref 19.0–186.0)
Retic Ct Pct: 1.8 % (ref 0.4–3.1)
Reticulocyte Hemoglobin: 32.1 pg (ref >27.9)

## 2023-04-13 LAB — LACTATE DEHYDROGENASE: LDH: 128 U/L (ref 98–192)

## 2023-04-13 LAB — CHOLESTEROL, BODY FLUID: Cholesterol, Fluid: 140 mg/dL

## 2023-04-13 NOTE — Assessment & Plan Note (Signed)
Refer to nutritionist.  Recommend nutrition supplementation.

## 2023-04-13 NOTE — Assessment & Plan Note (Addendum)
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 Recommend to obtain PET scan for further evaluation. Obtain bone marrow biopsy. Check CBC, CMP, LDH, peripheral blood flow cytometry, hepatitis, HIV

## 2023-04-13 NOTE — Assessment & Plan Note (Signed)
See above plan. 

## 2023-04-13 NOTE — Assessment & Plan Note (Signed)
Refer to GI 

## 2023-04-13 NOTE — Assessment & Plan Note (Signed)
Encourage oral hydration and avoid nephrotoxins.   

## 2023-04-13 NOTE — Assessment & Plan Note (Signed)
History of anxiety/depression. Recommend patient continue follow-up with [facility]

## 2023-04-13 NOTE — Progress Notes (Addendum)
Hematology/Oncology Consult Note [phone] [phone]     REFERRING PROVIDER: [name], MD    CHIEF COMPLAINTS/PURPOSE OF CONSULTATION:  Pleural effusion, monoclonal lymphocytosis   ASSESSMENT & PLAN:   Pleural effusion on right 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 Recommend to obtain PET scan for further evaluation. Obtain bone marrow biopsy. 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    Unintentional weight loss Refer to nutritionist.  Recommend nutrition supplementation  Mood disorder (HCC) History of anxiety/depression. Recommend patient continue follow-up with [facility]   Orders Placed This Encounter  Procedures   NM PET Image Initial (PI) Skull Base To Thigh    Standing Status:   Future    Standing Expiration Date:   04/12/2024    Order Specific Question:   If indicated for the ordered procedure, I authorize the administration of a radiopharmaceutical per Radiology protocol    Answer:   Yes    Order Specific Question:   Preferred imaging location?    Answer:   [facility]   IR BONE MARROW BIOPSY & ASPIRATION    Standing Status:   Future    Standing Expiration Date:   04/12/2024    Order Specific Question:   Reason for Exam (SYMPTOM  OR DIAGNOSIS REQUIRED)    Answer:   monoclonal b cell lymphocytosis    Order Specific Question:   Preferred Imaging Location?    Answer:   [facility]   Technologist smear review    Standing Status:   Future    Standing Expiration Date:   04/12/2024    Order Specific Question:   Clinical information:    Answer:   lymphoma   Lactate dehydrogenase    Standing Status:   Future     Number of Occurrences:   1    Standing Expiration Date:   04/12/2024   CBC with Differential/Platelet    Standing Status:   Future    Number of Occurrences:   1    Standing Expiration Date:   04/12/2024   Retic Panel    Standing Status:   Future    Number of Occurrences:   1    Standing Expiration Date:   04/12/2024   Flow cytometry panel-leukemia/lymphoma work-up    Standing Status:   Future    Number of Occurrences:   1    Standing Expiration Date:   04/12/2024   Hepatitis panel, acute    Standing Status:   Future    Number of Occurrences:   1    Standing Expiration Date:   04/12/2024   HIV Antibody (routine testing w rflx)    Standing Status:   Future    Number of Occurrences:   1    Standing Expiration Date:   04/12/2024   Comprehensive metabolic panel    Standing Status:   Future    Number of Occurrences:   1    Standing Expiration Date:   04/12/2024   Ambulatory referral to Gastroenterology    Referral Priority:   Routine    Referral Type:   Consultation    Referral Reason:   Specialty Services Required    Number of Visits Requested:   1   Ambulatory Referral to [facility] Nutrition  Referral Priority:   Routine    Referral Type:   Consultation    Referral Reason:   Specialty Services Required    Number of Visits Requested:   1    All questions were answered. The patient knows to call the clinic with any problems, questions or concerns.  Rickard Patience, MD, PhD [facility] Hematology Oncology 04/13/2023    HISTORY OF PRESENTING ILLNESS:  [name] 77 y.o. female presents to establish care for pleural effusion, unintentional weight loss.   She was recently hospitalized due to dyspnea and chest tightness.  04/07/2023  CT scan revealed significant pleural effusion, which was subsequently drained. Flow cytometry of the fluid indicated potential lymphoma cells. The patient has been experiencing inconsistent breathing, with noticeable shortness of breath upon exertion, a  significant change from her previous ability to walk for a mile multiple times a week. These symptoms began approximately two weeks prior to the consultation.  The patient's appetite has been notably decreased, with consumption of only one substantial meal per day. This has resulted in a weight loss of over twenty pounds in the past few months. The patient reports some sweating, not on a daily basis, for the past year.  The patient also reports a history of shoulder injury due to a fall down a flight of stairs in the 1990s, which required multiple surgeries. She currently manages the pain with Tramadol. The patient lives alone and has been experiencing difficulty with mobility due to her current health condition.  MEDICAL HISTORY:  Past Medical History:  Diagnosis Date   Anemia    Asthma    COPD (chronic obstructive pulmonary disease) (HCC)    Diabetes mellitus without complication (HCC)    type 2   DVT (deep venous thrombosis) (HCC) 1980   GERD (gastroesophageal reflux disease)    History of hiatal hernia    Hypertension    Hypothyroidism     SURGICAL HISTORY: Past Surgical History:  Procedure Laterality Date   CATARACT EXTRACTION Bilateral    CHOLECYSTECTOMY     COLONOSCOPY     ESOPHAGOGASTRODUODENOSCOPY (EGD) WITH PROPOFOL N/A 01/17/2022   Procedure: ESOPHAGOGASTRODUODENOSCOPY (EGD) WITH PROPOFOL;  Surgeon: Jaynie Collins, DO;  Location: [facility] ENDOSCOPY;  Service: Gastroenterology;  Laterality: N/A;   HUMERUS SURGERY Left    fracture (7 surgeries total)   MENISCUS REPAIR Right 2019   THYROIDECTOMY     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; reconstruction from hip bone   TONSILECTOMY, ADENOIDECTOMY, BILATERAL MYRINGOTOMY AND TUBES     TOTAL KNEE ARTHROPLASTY Right 03/14/2022   Procedure: TOTAL KNEE ARTHROPLASTY;  Surgeon: Reinaldo Berber, MD;  Location: [facility] ORS;  Service: Orthopedics;  Laterality: Right;   TOTAL SHOULDER REPLACEMENT Left    TUBAL LIGATION     WRIST SURGERY  Left    multiple fractures from falls (14 surgeries from wrist to shoulder)    SOCIAL HISTORY: Social History   Socioeconomic History   Marital status: Widowed    Spouse name: Not on file   Number of children: 6   Years of education: Not on file   Highest education level: Not on file  Occupational History   Not on file  Tobacco Use   Smoking status: Never   Smokeless tobacco: Never  Vaping Use   Vaping status: Never Used  Substance and Sexual Activity   Alcohol use: Never   Drug use: Never   Sexual activity: Not Currently  Other Topics Concern   Not on file  Social History Narrative   Lives in senior housing in [place] (apt[place]   Social Determinants of Health   Financial Resource Strain: Not on file  Food Insecurity: No Food Insecurity (04/13/2023)   Hunger Vital Sign    Worried About Running Out of Food in the Last Year: Never true    Ran Out of Food in the Last Year: Never true  Transportation Needs: No Transportation Needs (04/08/2023)   PRAPARE - Administrator, Civil Service (Medical): No    Lack of Transportation (Non-Medical): No  Physical Activity: Not on file  Stress: Not on file  Social Connections: Not on file  Intimate Partner Violence: Not At Risk (04/13/2023)   Humiliation, Afraid, Rape, and Kick questionnaire    Fear of Current or Ex-Partner: No    Emotionally Abused: No    Physically Abused: No    Sexually Abused: No    FAMILY HISTORY: Family History  Problem Relation Age of Onset   Kidney disease Mother    Tuberculosis Father     ALLERGIES:  is allergic to cephalosporins, codeine, contrast media [iodinated contrast media], and oxycodone.  MEDICATIONS:  Current Outpatient Medications  Medication Sig Dispense Refill   acetaminophen (TYLENOL) 325 MG tablet Take 1,300 mg by mouth every 6 (six) hours as needed for moderate pain.     ADVAIR HFA 115-21 MCG/ACT inhaler Inhale 2 puffs into the lungs 2 (two) times daily.     albuterol  (PROVENTIL) (2.5 MG/3ML) 0.083% nebulizer solution Take 2.5 mg by nebulization every 4 (four) hours as needed for wheezing or shortness of breath.     albuterol (VENTOLIN HFA) 108 (90 Base) MCG/ACT inhaler Inhale 2-4 puffs by mouth every 4 hours as needed for wheezing, cough, and/or shortness of breath 6.7 g 0   amiodarone (PACERONE) 100 MG tablet Take 1 tablet (100 mg total) by mouth daily. 30 tablet 0   amoxicillin-clavulanate (AUGMENTIN) 875-125 MG tablet Take 1 tablet by mouth 2 (two) times daily with a meal for 3 days. 6 tablet 0   apixaban (ELIQUIS) 5 MG TABS tablet Take 1 tablet (5 mg total) by mouth 2 (two) times daily. 60 tablet 0   Ascorbic Acid (VITAMIN C CR) 1500 MG TBCR Take 1 tablet by mouth daily.     atorvastatin (LIPITOR) 20 MG tablet Take 20 mg by mouth daily.     azithromycin (ZITHROMAX) 250 MG tablet Take 1 tablet (250 mg total) by mouth daily. 3 tablet 0   busPIRone (BUSPAR) 5 MG tablet Take 5 mg by mouth 3 (three) times daily as needed.     Carboxymeth-Glycerin-Polysorb (REFRESH OPTIVE ADVANCED) 0.5-1-0.5 % SOLN Apply 2 drops to eye daily.     Emollient (EUCERIN) lotion Apply 1 Application topically as needed for dry skin.     esomeprazole (NEXIUM) 40 MG capsule Take 40 mg by mouth 2 (two) times daily.     ferrous sulfate 325 (65 FE) MG tablet Take 325 mg by mouth daily.     fluticasone (FLONASE) 50 MCG/ACT nasal spray Place 2 sprays into both nostrils daily as needed for allergies or rhinitis.     furosemide (LASIX) 20 MG tablet Take 1 tablet (20 mg total) by mouth 2 (two) times daily with breakfast and lunch. 60 tablet 0   ipratropium-albuterol (DUONEB) 0.5-2.5 (3) MG/3ML SOLN Take 3 mLs by nebulization every 4 (four) hours as needed.     ketoconazole (NIZORAL) 2 % shampoo Apply 1 Application topically 2 (two) times a week.  ketotifen (ZADITOR) 0.025 % ophthalmic solution Place 1 drop into both eyes daily as needed.     levothyroxine (SYNTHROID) 125 MCG tablet Take 125  mcg by mouth daily before breakfast.     loperamide (IMODIUM) 2 MG capsule Take 2 mg by mouth as needed for diarrhea or loose stools.     loratadine (CLARITIN) 10 MG tablet Take 1 tablet (10 mg total) by mouth daily. 30 tablet 0   Magnesium 250 MG TABS Take 1 tablet by mouth daily.     metoprolol tartrate (LOPRESSOR) 25 MG tablet Take 1 tablet (25 mg total) by mouth 2 (two) times daily. 60 tablet 0   Omega-3 1000 MG CAPS Take 1 capsule by mouth daily.     OXYGEN Inhale 2 L into the lungs at bedtime.     potassium chloride (KLOR-CON M) 10 MEQ tablet Take 10 mEq by mouth daily.     potassium chloride (KLOR-CON) 10 MEQ tablet Take 10 mEq by mouth 2 (two) times daily.     rOPINIRole (REQUIP) 0.25 MG tablet Take 0.25 mg by mouth 2 (two) times daily.     sodium chloride (OCEAN) 0.65 % SOLN nasal spray Place 1 spray into both nostrils as needed for congestion.  0   SYSTANE ULTRA 0.4-0.3 % SOLN Apply to eye.     traMADol (ULTRAM) 50 MG tablet Take 50 mg by mouth every 6 (six) hours as needed.     traZODone (DESYREL) 50 MG tablet Take 50 mg by mouth at bedtime as needed for sleep.     TURMERIC PO Take 1 capsule by mouth daily.     VITAMIN D-VITAMIN K PO Take 4,000 Units by mouth daily.     metoprolol tartrate (LOPRESSOR) 25 MG tablet Take 0.5 tablets (12.5 mg total) by mouth 2 (two) times daily. (Patient not taking: Reported on 04/13/2023) 30 tablet 0   No current facility-administered medications for this visit.    Review of Systems  Constitutional:  Negative for appetite change, chills, fatigue and fever.  HENT:   Negative for hearing loss and voice change.   Eyes:  Negative for eye problems.  Respiratory:  Positive for shortness of breath. Negative for chest tightness and cough.   Cardiovascular:  Negative for chest pain.  Gastrointestinal:  Negative for abdominal distention, abdominal pain and blood in stool.  Endocrine: Negative for hot flashes.  Genitourinary:  Negative for difficulty  urinating and frequency.   Musculoskeletal:  Negative for arthralgias.  Skin:  Negative for itching and rash.  Neurological:  Negative for extremity weakness.  Hematological:  Negative for adenopathy.  Psychiatric/Behavioral:  Positive for depression. Negative for confusion. The patient is nervous/anxious.      PHYSICAL EXAMINATION: ECOG PERFORMANCE STATUS: 1 - Symptomatic but completely ambulatory  Vitals:   04/13/23 1129  BP: (!) 108/57  Pulse: 69  Resp: 18  Temp: 97.6 F (36.4 C)  SpO2: 97%   Filed Weights   04/13/23 1129  Weight: 139 lb 14.4 oz (63.5 kg)    Physical Exam Constitutional:      General: She is not in acute distress.    Appearance: She is not diaphoretic.  HENT:     Head: Normocephalic and atraumatic.     Nose: Nose normal.  Eyes:     General: No scleral icterus.    Pupils: Pupils are equal, round, and reactive to light.  Cardiovascular:     Rate and Rhythm: Normal rate and regular rhythm.     Heart  sounds: No murmur heard. Pulmonary:     Effort: Pulmonary effort is normal. No respiratory distress.     Comments: Decreased breath sound right lower lobe Abdominal:     General: There is no distension.     Palpations: Abdomen is soft.     Tenderness: There is no abdominal tenderness.  Musculoskeletal:        General: Normal range of motion.     Cervical back: Normal range of motion and neck supple.  Skin:    General: Skin is warm and dry.     Findings: No erythema.  Neurological:     Mental Status: She is alert and oriented to person, place, and time. Mental status is at baseline.     Cranial Nerves: No cranial nerve deficit.     Motor: No abnormal muscle tone.  Psychiatric:        Mood and Affect: Affect normal.     Comments: tearful      LABORATORY DATA:  I have reviewed the data as listed    Latest Ref Rng & Units 04/13/2023   12:13 PM 04/10/2023    4:54 AM 04/09/2023    4:59 AM  CBC  WBC 4.0 - 10.5 K/uL 10.3  8.1  8.9   Hemoglobin  12.0 - 15.0 g/dL 16.1  09.6  04.5   Hematocrit 36.0 - 46.0 % 39.6  41.0  40.8   Platelets 150 - 400 K/uL 181  175  209       Latest Ref Rng & Units 04/13/2023   12:13 PM 04/11/2023    5:49 AM 04/10/2023    4:54 AM  CMP  Glucose 70 - 99 mg/dL 409  811  914   BUN 8 - 23 mg/dL 24  27  40   Creatinine 0.44 - 1.00 mg/dL 7.82  9.56  2.13   Sodium 135 - 145 mmol/L 132  132  136   Potassium 3.5 - 5.1 mmol/L 3.8  3.6  4.1   Chloride 98 - 111 mmol/L 94  97  100   CO2 22 - 32 mmol/L 26  25  27    Calcium 8.9 - 10.3 mg/dL 8.8  8.5  8.8   Total Protein 6.5 - 8.1 g/dL 7.3     Total Bilirubin <1.2 mg/dL 0.3     Alkaline Phos 38 - 126 U/L 42     AST 15 - 41 U/L 24     ALT 0 - 44 U/L 20        RADIOGRAPHIC STUDIES: I have personally reviewed the radiological images as listed and agreed with the findings in the report. DG Chest Port 1 View  Result Date: 04/12/2023 CLINICAL DATA:  Cough, shortness of breath EXAM: PORTABLE CHEST 1 VIEW COMPARISON:  04/11/2023 FINDINGS: Heart size is normal. Dense mitral annulus calcifications. Benign calcified granulomatous mediastinal lymph nodes. Implantable loop recorder. No acute airspace opacity. Benign calcified nodule specific status post left shoulder reverse arthroplasty. IMPRESSION: No acute abnormality of the lungs in AP portable projection. Electronically Signed   By: Jearld Lesch M.D.   On: 04/12/2023 09:10   DG Chest Port 1 View  Result Date: 04/11/2023 CLINICAL DATA:  Pleural effusion. EXAM: PORTABLE CHEST 1 VIEW COMPARISON:  April 10, 2023. FINDINGS: The heart size and mediastinal contours are within normal limits. Status post left shoulder arthroplasty. Old left rib fractures are noted. Increased right basilar opacity is noted concerning for worsening effusion and associated atelectasis or infiltrate. Left lung is  unremarkable. IMPRESSION: Increased right basilar opacity is noted concerning for worsening effusion and associated atelectasis or  infiltrate. Electronically Signed   By: Lupita Raider M.D.   On: 04/11/2023 08:51   ECHOCARDIOGRAM COMPLETE  Result Date: 04/10/2023    ECHOCARDIOGRAM REPORT   Patient Name:   SHIORI CONNAWAY Date of Exam: 04/09/2023 Medical Rec #:  [id number]      Height:       62.0 in Accession #:    [id number]     Weight:       141.5 lb Date of Birth:  1945-10-26       BSA:          1.650 m Patient Age:    77 years       BP:           121/76 mmHg Patient Gender: F              HR:           58 bpm. Exam Location:  [facility] Procedure: 2D Echo, Cardiac Doppler and Color Doppler Indications:    DOE R06.0  History:        Patient has prior history of Echocardiogram examinations, most                 recent 05/24/2022. COPD, Arrythmias:Atrial Fibrillation; Risk                 Factors:Hypertension and Diabetes.  Sonographer:    Dondra Prader RVT RCS Referring Phys: [id number] Sunnie Nielsen  Sonographer Comments: Patient unable to tolerate probe pressure on abdomen; no subcostal views. IMPRESSIONS  1. Left ventricular ejection fraction, by estimation, is 55 to 60%. The left ventricle has normal function. The left ventricle has no regional wall motion abnormalities. Left ventricular diastolic parameters are consistent with Grade I diastolic dysfunction (impaired relaxation).  2. Right ventricular systolic function is normal. The right ventricular size is normal. There is normal pulmonary artery systolic pressure.  3. Left atrial size was mildly dilated.  4. The mitral valve is normal in structure. Mild mitral valve regurgitation. Mild to moderate mitral stenosis. The mean mitral valve gradient is 6.0 mmHg. Severe mitral annular calcification.  5. The aortic valve is normal in structure. Aortic valve regurgitation is trivial. No aortic stenosis is present.  6. The inferior vena cava is normal in size with greater than 50% respiratory variability, suggesting right atrial pressure of 3 mmHg. FINDINGS  Left Ventricle: Left ventricular ejection  fraction, by estimation, is 55 to 60%. The left ventricle has normal function. The left ventricle has no regional wall motion abnormalities. The left ventricular internal cavity size was normal in size. There is  no left ventricular hypertrophy. Left ventricular diastolic parameters are consistent with Grade I diastolic dysfunction (impaired relaxation). Right Ventricle: The right ventricular size is normal. No increase in right ventricular wall thickness. Right ventricular systolic function is normal. There is normal pulmonary artery systolic pressure. The tricuspid regurgitant velocity is 2.31 m/s, and  with an assumed right atrial pressure of 3 mmHg, the estimated right ventricular systolic pressure is 24.3 mmHg. Left Atrium: Left atrial size was mildly dilated. Right Atrium: Right atrial size was normal in size. Pericardium: There is no evidence of pericardial effusion. Mitral Valve: The mitral valve is normal in structure. There is moderate thickening of the mitral valve leaflet(s). There is moderate calcification of the mitral valve leaflet(s). Severe mitral annular calcification. Mild mitral valve regurgitation. Mild  to moderate mitral  valve stenosis. The mean mitral valve gradient is 6.0 mmHg. Tricuspid Valve: The tricuspid valve is normal in structure. Tricuspid valve regurgitation is mild . No evidence of tricuspid stenosis. Aortic Valve: The aortic valve is normal in structure. Aortic valve regurgitation is trivial. Aortic regurgitation PHT measures 600 msec. No aortic stenosis is present. Aortic valve mean gradient measures 7.0 mmHg. Aortic valve peak gradient measures 12.7 mmHg. Aortic valve area, by VTI measures 1.18 cm. Pulmonic Valve: The pulmonic valve was normal in structure. Pulmonic valve regurgitation is not visualized. No evidence of pulmonic stenosis. Aorta: The aortic root is normal in size and structure. Venous: The inferior vena cava is normal in size with greater than 50% respiratory  variability, suggesting right atrial pressure of 3 mmHg. IAS/Shunts: No atrial level shunt detected by color flow Doppler.  LEFT VENTRICLE PLAX 2D LVIDd:         4.30 cm   Diastology LVIDs:         3.10 cm   LV e' medial:    4.56 cm/s LV PW:         1.00 cm   LV E/e' medial:  24.6 LV IVS:        0.80 cm   LV e' lateral:   4.87 cm/s LVOT diam:     1.40 cm   LV E/e' lateral: 23.0 LV SV:         43 LV SV Index:   26 LVOT Area:     1.54 cm  RIGHT VENTRICLE RV Basal diam:  2.60 cm RV S prime:     11.30 cm/s TAPSE (M-mode): 1.7 cm LEFT ATRIUM             Index        RIGHT ATRIUM          Index LA diam:        4.40 cm 2.67 cm/m   RA Area:     7.81 cm LA Vol (A2C):   50.9 ml 30.84 ml/m  RA Volume:   13.80 ml 8.36 ml/m LA Vol (A4C):   42.4 ml 25.69 ml/m LA Biplane Vol: 49.3 ml 29.87 ml/m  AORTIC VALVE                     PULMONIC VALVE AV Area (Vmax):    1.37 cm      PV Vmax:       1.06 m/s AV Area (Vmean):   1.26 cm      PV Peak grad:  4.5 mmHg AV Area (VTI):     1.18 cm AV Vmax:           178.00 cm/s AV Vmean:          116.000 cm/s AV VTI:            0.369 m AV Peak Grad:      12.7 mmHg AV Mean Grad:      7.0 mmHg LVOT Vmax:         158.00 cm/s LVOT Vmean:        94.800 cm/s LVOT VTI:          0.282 m LVOT/AV VTI ratio: 0.76 AI PHT:            600 msec  AORTA Ao Root diam: 2.80 cm Ao Asc diam:  3.60 cm MITRAL VALVE                TRICUSPID VALVE MV Area (PHT): 2.07 cm  TR Peak grad:   21.3 mmHg MV Mean grad:  6.0 mmHg     TR Vmax:        231.00 cm/s MV Decel Time: 366 msec MR Peak grad: 11.8 mmHg     SHUNTS MR Mean grad: 6.0 mmHg      Systemic VTI:  0.28 m MR Vmax:      172.00 cm/s   Systemic Diam: 1.40 cm MR Vmean:     116.0 cm/s MV E velocity: 112.00 cm/s MV A velocity: 167.00 cm/s MV E/A ratio:  0.67 Lorine Bears MD Electronically signed by Lorine Bears MD Signature Date/Time: 04/10/2023/5:17:03 PM    Final    DG Chest Port 1 View  Result Date: 04/10/2023 CLINICAL DATA:  142230 Pleural effusion  142230. EXAM: PORTABLE CHEST 1 VIEW COMPARISON:  Chest radiograph 04/09/2023. FINDINGS: Decreased, trace residual right pleural effusion. Increasing airspace opacity in the medial aspect of the right lung base, suspicious for aspiration or developing infection. No pneumothorax. IMPRESSION: Decreased, trace residual right pleural effusion. Increasing airspace opacity in the medial aspect of the right lung base, suspicious for aspiration or developing infection. Electronically Signed   By: Orvan Falconer M.D.   On: 04/10/2023 12:47   DG Chest Port 1 View  Result Date: 04/09/2023 CLINICAL DATA:  Post thoracentesis EXAM: PORTABLE CHEST 1 VIEW COMPARISON:  CT chest 04/07/2023, chest x-ray 05/22/2022 FINDINGS: Left-sided electronic recording device. Normal cardiac size. Mitral annular calcifications. Left lung grossly clear. Decreased right pleural effusion with small moderate residual pleural effusion. No convincing right pneumothorax. Airspace disease at the right base. Aortic atherosclerosis. Old appearing left-sided rib fractures. Left shoulder replacement IMPRESSION: Decreased right pleural effusion with small to moderate residual pleural effusion. No convincing pneumothorax. Airspace disease at the right base, atelectasis versus pneumonia. Electronically Signed   By: Jasmine Pang M.D.   On: 04/09/2023 16:27   US Abdomen Limited RUQ (LIVER/GB)  Result Date: 04/09/2023 CLINICAL DATA:  Cirrhosis EXAM: ULTRASOUND ABDOMEN LIMITED RIGHT UPPER QUADRANT COMPARISON:  None Available. FINDINGS: Gallbladder: Prior cholecystectomy. Common bile duct: Diameter: 6 mm. Liver: No focal lesion identified. Nodular liver contour. Normal parenchymal echogenicity. Portal vein is patent on color Doppler imaging with normal direction of blood flow towards the liver. Other: Right pleural effusion. IMPRESSION: 1. Cirrhotic liver morphology. 2. Right pleural effusion. Electronically Signed   By: Allegra Lai M.D.   On: 04/09/2023  12:59   DG Foot Complete Right  Result Date: 04/08/2023 CLINICAL DATA:  Right foot pain EXAM: RIGHT FOOT COMPLETE - 3+ VIEW COMPARISON:  None Available. FINDINGS: No fracture or dislocation is seen. Mild degenerative changes of the 1st MCP joint. Degenerative changes of the dorsal midfoot. Moderate posterior and plantar enthesophytes. IMPRESSION: No fracture or dislocation is seen. Degenerative changes, as above. Electronically Signed   By: Charline Bills M.D.   On: 04/08/2023 01:55   CT CHEST WO CONTRAST  Result Date: 04/07/2023 CLINICAL DATA:  Chest pain, shortness of breath EXAM: CT CHEST WITHOUT CONTRAST TECHNIQUE: Multidetector CT imaging of the chest was performed following the standard protocol without IV contrast. RADIATION DOSE REDUCTION: This exam was performed according to the departmental dose-optimization program which includes automated exposure control, adjustment of the mA and/or kV according to patient size and/or use of iterative reconstruction technique. COMPARISON:  CT 05/23/2022 FINDINGS: Cardiovascular: Heart size is upper limits of normal. No pericardial effusion. Thoracic aorta is nonaneurysmal. There are atherosclerotic vascular calcifications of the aorta and coronary arteries. Calcification of the mitral annulus.  Central pulmonary vasculature is nondilated. Mediastinum/Nodes: No enlarged mediastinal or axillary lymph nodes. Thyroid gland, trachea, and esophagus demonstrate no significant findings. Small hiatal hernia. Lungs/Pleura: Moderate-large layering right-sided pleural effusion with complete atelectasis of the right lower lobe and near-complete atelectasis of the right middle lobe. Partial compressive atelectasis of the right upper lobe. Mild mosaic attenuation of the aerated portion of the right upper lobe and within the left lung. No left-sided pleural effusion. No pneumothorax. Upper Abdomen: Nodular hepatic surface contour. No acute abnormality. Musculoskeletal: No  chest wall mass or suspicious bone lesions identified. Left chest wall loop recorder. IMPRESSION: 1. Moderate-large layering right-sided pleural effusion with complete atelectasis of the right lower lobe and near-complete atelectasis of the right middle lobe. Partial compressive atelectasis of the right upper lobe. 2. Mild mosaic attenuation of the aerated portion of the right upper lobe and within the left lung, which may be related to small airways disease. 3. Nodular hepatic surface contour, suggestive of cirrhosis. 4. Aortic and coronary artery atherosclerosis (ICD10-I70.0). Electronically Signed   By: Duanne Guess D.O.   On: 04/07/2023 15:25

## 2023-04-13 NOTE — Telephone Encounter (Signed)
PET has been scheuduled for 11/13.   Bmbx request faxed to IR. BMBX date pending.   Will scheudle MD/ dietitian 1 week after PET/BMbx once dates are set.

## 2023-04-14 ENCOUNTER — Other Ambulatory Visit: Payer: Self-pay

## 2023-04-14 ENCOUNTER — Inpatient Hospital Stay
Admission: EM | Admit: 2023-04-14 | Discharge: 2023-04-17 | DRG: 552 | Disposition: A | Discharge status: Home Health | Payer: Medicare (Managed Care) | Attending: Student in an Organized Health Care Education/Training Program | Admitting: Student in an Organized Health Care Education/Training Program

## 2023-04-14 ENCOUNTER — Emergency Department: Payer: Medicare (Managed Care)

## 2023-04-14 DIAGNOSIS — M79604 Pain in right leg: Secondary | ICD-10-CM | POA: Diagnosis present

## 2023-04-14 DIAGNOSIS — J449 Chronic obstructive pulmonary disease, unspecified: Secondary | ICD-10-CM | POA: Diagnosis present

## 2023-04-14 DIAGNOSIS — Z881 Allergy status to other antibiotic agents status: Secondary | ICD-10-CM

## 2023-04-14 DIAGNOSIS — N1831 Chronic kidney disease, stage 3a: Secondary | ICD-10-CM | POA: Diagnosis present

## 2023-04-14 DIAGNOSIS — Z9842 Cataract extraction status, left eye: Secondary | ICD-10-CM

## 2023-04-14 DIAGNOSIS — Z89421 Acquired absence of other right toe(s): Secondary | ICD-10-CM

## 2023-04-14 DIAGNOSIS — Z79899 Other long term (current) drug therapy: Secondary | ICD-10-CM

## 2023-04-14 DIAGNOSIS — I10 Essential (primary) hypertension: Secondary | ICD-10-CM | POA: Diagnosis present

## 2023-04-14 DIAGNOSIS — I482 Chronic atrial fibrillation, unspecified: Secondary | ICD-10-CM | POA: Diagnosis not present

## 2023-04-14 DIAGNOSIS — C8593 Non-Hodgkin lymphoma, unspecified, intra-abdominal lymph nodes: Secondary | ICD-10-CM | POA: Diagnosis present

## 2023-04-14 DIAGNOSIS — Z9851 Tubal ligation status: Secondary | ICD-10-CM

## 2023-04-14 DIAGNOSIS — F32A Depression, unspecified: Secondary | ICD-10-CM | POA: Diagnosis present

## 2023-04-14 DIAGNOSIS — Z9049 Acquired absence of other specified parts of digestive tract: Secondary | ICD-10-CM

## 2023-04-14 DIAGNOSIS — Z96651 Presence of right artificial knee joint: Secondary | ICD-10-CM | POA: Diagnosis present

## 2023-04-14 DIAGNOSIS — Z9181 History of falling: Secondary | ICD-10-CM

## 2023-04-14 DIAGNOSIS — Z841 Family history of disorders of kidney and ureter: Secondary | ICD-10-CM

## 2023-04-14 DIAGNOSIS — Z86718 Personal history of other venous thrombosis and embolism: Secondary | ICD-10-CM

## 2023-04-14 DIAGNOSIS — N183 Chronic kidney disease, stage 3 unspecified: Secondary | ICD-10-CM

## 2023-04-14 DIAGNOSIS — E890 Postprocedural hypothyroidism: Secondary | ICD-10-CM | POA: Diagnosis present

## 2023-04-14 DIAGNOSIS — S32040A Wedge compression fracture of fourth lumbar vertebra, initial encounter for closed fracture: Secondary | ICD-10-CM

## 2023-04-14 DIAGNOSIS — J90 Pleural effusion, not elsewhere classified: Secondary | ICD-10-CM | POA: Diagnosis present

## 2023-04-14 DIAGNOSIS — J4489 Other specified chronic obstructive pulmonary disease: Secondary | ICD-10-CM | POA: Diagnosis present

## 2023-04-14 DIAGNOSIS — K7460 Unspecified cirrhosis of liver: Secondary | ICD-10-CM | POA: Diagnosis present

## 2023-04-14 DIAGNOSIS — Z602 Problems related to living alone: Secondary | ICD-10-CM | POA: Diagnosis present

## 2023-04-14 DIAGNOSIS — Z9841 Cataract extraction status, right eye: Secondary | ICD-10-CM

## 2023-04-14 DIAGNOSIS — C8599 Non-Hodgkin lymphoma, unspecified, extranodal and solid organ sites: Secondary | ICD-10-CM

## 2023-04-14 DIAGNOSIS — S32048A Other fracture of fourth lumbar vertebra, initial encounter for closed fracture: Secondary | ICD-10-CM | POA: Diagnosis present

## 2023-04-14 DIAGNOSIS — R296 Repeated falls: Secondary | ICD-10-CM | POA: Diagnosis present

## 2023-04-14 DIAGNOSIS — E1122 Type 2 diabetes mellitus with diabetic chronic kidney disease: Secondary | ICD-10-CM | POA: Diagnosis present

## 2023-04-14 DIAGNOSIS — M48061 Spinal stenosis, lumbar region without neurogenic claudication: Secondary | ICD-10-CM | POA: Diagnosis present

## 2023-04-14 DIAGNOSIS — Z96612 Presence of left artificial shoulder joint: Secondary | ICD-10-CM | POA: Diagnosis present

## 2023-04-14 DIAGNOSIS — Z831 Family history of other infectious and parasitic diseases: Secondary | ICD-10-CM

## 2023-04-14 DIAGNOSIS — S32000A Wedge compression fracture of unspecified lumbar vertebra, initial encounter for closed fracture: Secondary | ICD-10-CM | POA: Diagnosis not present

## 2023-04-14 DIAGNOSIS — E1129 Type 2 diabetes mellitus with other diabetic kidney complication: Secondary | ICD-10-CM | POA: Diagnosis present

## 2023-04-14 DIAGNOSIS — F419 Anxiety disorder, unspecified: Secondary | ICD-10-CM | POA: Diagnosis present

## 2023-04-14 DIAGNOSIS — Y92090 Kitchen in other non-institutional residence as the place of occurrence of the external cause: Secondary | ICD-10-CM | POA: Diagnosis not present

## 2023-04-14 DIAGNOSIS — F4323 Adjustment disorder with mixed anxiety and depressed mood: Secondary | ICD-10-CM | POA: Diagnosis present

## 2023-04-14 DIAGNOSIS — Z885 Allergy status to narcotic agent status: Secondary | ICD-10-CM

## 2023-04-14 DIAGNOSIS — K219 Gastro-esophageal reflux disease without esophagitis: Secondary | ICD-10-CM

## 2023-04-14 DIAGNOSIS — S0003XA Contusion of scalp, initial encounter: Secondary | ICD-10-CM | POA: Diagnosis present

## 2023-04-14 DIAGNOSIS — I480 Paroxysmal atrial fibrillation: Secondary | ICD-10-CM | POA: Diagnosis present

## 2023-04-14 DIAGNOSIS — W010XXA Fall on same level from slipping, tripping and stumbling without subsequent striking against object, initial encounter: Secondary | ICD-10-CM | POA: Diagnosis present

## 2023-04-14 DIAGNOSIS — Z91041 Radiographic dye allergy status: Secondary | ICD-10-CM

## 2023-04-14 DIAGNOSIS — Z8781 Personal history of (healed) traumatic fracture: Secondary | ICD-10-CM

## 2023-04-14 DIAGNOSIS — Z9981 Dependence on supplemental oxygen: Secondary | ICD-10-CM

## 2023-04-14 DIAGNOSIS — Z79891 Long term (current) use of opiate analgesic: Secondary | ICD-10-CM

## 2023-04-14 DIAGNOSIS — W19XXXA Unspecified fall, initial encounter: Secondary | ICD-10-CM | POA: Diagnosis present

## 2023-04-14 DIAGNOSIS — E039 Hypothyroidism, unspecified: Secondary | ICD-10-CM | POA: Diagnosis not present

## 2023-04-14 DIAGNOSIS — D72829 Elevated white blood cell count, unspecified: Secondary | ICD-10-CM | POA: Diagnosis present

## 2023-04-14 DIAGNOSIS — Z7951 Long term (current) use of inhaled steroids: Secondary | ICD-10-CM

## 2023-04-14 DIAGNOSIS — S3210XA Unspecified fracture of sacrum, initial encounter for closed fracture: Secondary | ICD-10-CM | POA: Diagnosis not present

## 2023-04-14 DIAGNOSIS — I82409 Acute embolism and thrombosis of unspecified deep veins of unspecified lower extremity: Secondary | ICD-10-CM | POA: Diagnosis present

## 2023-04-14 DIAGNOSIS — Z79890 Hormone replacement therapy (postmenopausal): Secondary | ICD-10-CM

## 2023-04-14 DIAGNOSIS — Z7901 Long term (current) use of anticoagulants: Secondary | ICD-10-CM | POA: Diagnosis not present

## 2023-04-14 DIAGNOSIS — I129 Hypertensive chronic kidney disease with stage 1 through stage 4 chronic kidney disease, or unspecified chronic kidney disease: Secondary | ICD-10-CM | POA: Diagnosis present

## 2023-04-14 DIAGNOSIS — Z9089 Acquired absence of other organs: Secondary | ICD-10-CM

## 2023-04-14 LAB — CBC WITH DIFFERENTIAL/PLATELET
Abs Immature Granulocytes: 0.1 10*3/uL — ABNORMAL HIGH (ref 0.00–0.07)
Basophils Absolute: 0 10*3/uL (ref 0.0–0.1)
Basophils Relative: 0 %
Eosinophils Absolute: 0.1 10*3/uL (ref 0.0–0.5)
Eosinophils Relative: 1 %
HCT: 38.7 % (ref 36.0–46.0)
Hemoglobin: 12.8 g/dL (ref 12.0–15.0)
Immature Granulocytes: 1 %
Lymphocytes Relative: 9 %
Lymphs Abs: 1 10*3/uL (ref 0.7–4.0)
MCH: 30.7 pg (ref 26.0–34.0)
MCHC: 33.1 g/dL (ref 30.0–36.0)
MCV: 92.8 fL (ref 80.0–100.0)
Monocytes Absolute: 0.8 10*3/uL (ref 0.1–1.0)
Monocytes Relative: 7 %
Neutro Abs: 10.2 10*3/uL — ABNORMAL HIGH (ref 1.7–7.7)
Neutrophils Relative %: 82 %
Platelets: 156 10*3/uL (ref 150–400)
RBC: 4.17 MIL/uL (ref 3.87–5.11)
RDW: 13.2 % (ref 11.5–15.5)
WBC: 12.3 10*3/uL — ABNORMAL HIGH (ref 4.0–10.5)
nRBC: 0 % (ref 0.0–0.2)

## 2023-04-14 LAB — COMPREHENSIVE METABOLIC PANEL
ALT: 14 U/L (ref 0–44)
AST: 29 U/L (ref 15–41)
Albumin: 3.4 g/dL — ABNORMAL LOW (ref 3.5–5.0)
Alkaline Phosphatase: 37 U/L — ABNORMAL LOW (ref 38–126)
Anion gap: 11 (ref 5–15)
BUN: 34 mg/dL — ABNORMAL HIGH (ref 8–23)
CO2: 26 mmol/L (ref 22–32)
Calcium: 8.8 mg/dL — ABNORMAL LOW (ref 8.9–10.3)
Chloride: 95 mmol/L — ABNORMAL LOW (ref 98–111)
Creatinine, Ser: 1.02 mg/dL — ABNORMAL HIGH (ref 0.44–1.00)
GFR, Estimated: 56 mL/min — ABNORMAL LOW (ref >60)
Glucose, Bld: 199 mg/dL — ABNORMAL HIGH (ref 70–99)
Potassium: 4.4 mmol/L (ref 3.5–5.1)
Sodium: 132 mmol/L — ABNORMAL LOW (ref 135–145)
Total Bilirubin: 0.7 mg/dL (ref <1.2)
Total Protein: 7.1 g/dL (ref 6.5–8.1)

## 2023-04-14 LAB — CK: Total CK: 57 U/L (ref 38–234)

## 2023-04-14 MED ORDER — FENTANYL CITRATE PF 50 MCG/ML IJ SOSY
25.0000 ug | PREFILLED_SYRINGE | Freq: Once | INTRAMUSCULAR | Status: AC
  Administered 2023-04-14: 25 ug via INTRAVENOUS
  Filled 2023-04-14 (×2): qty 1

## 2023-04-14 MED ORDER — ONDANSETRON HCL 4 MG/2ML IJ SOLN: 4.0000 mg | Freq: Four times a day (QID) | INTRAMUSCULAR | Status: DC | PRN

## 2023-04-14 MED ORDER — ONDANSETRON HCL 4 MG PO TABS: 4.0000 mg | ORAL_TABLET | Freq: Four times a day (QID) | ORAL | Status: DC | PRN

## 2023-04-14 MED ORDER — APIXABAN 5 MG PO TABS
5.0000 mg | ORAL_TABLET | Freq: Two times a day (BID) | ORAL | Status: DC
  Administered 2023-04-15 – 2023-04-16 (×3): 5 mg via ORAL
  Filled 2023-04-14 (×3): qty 1

## 2023-04-14 MED ORDER — ENOXAPARIN SODIUM 40 MG/0.4ML IJ SOSY: 40.0000 mg | PREFILLED_SYRINGE | INTRAMUSCULAR | Status: DC

## 2023-04-14 MED ORDER — SODIUM CHLORIDE 0.9 % IV BOLUS
1000.0000 mL | Freq: Once | INTRAVENOUS | Status: AC
Start: 2023-04-14 — End: 2023-04-14
  Administered 2023-04-14: 1000 mL via INTRAVENOUS

## 2023-04-14 MED ORDER — SODIUM CHLORIDE 0.9 % IV SOLN: INTRAVENOUS | Status: DC

## 2023-04-14 NOTE — Consult Note (Signed)
[facility] CARDIOLOGY CONSULT NOTE       Patient ID: [name] MRN: [id number] DOB/AGE: [birth year] 77 y.o.  Admit date: 04/14/2023 Referring Physician Dr. Doree Albee Primary Physician [facility]  Primary Cardiologist none Reason for Consultation anticoagulation discussion  HPI: [name] is a 77 y.o. female  with a past medical history of atrial fibrillation, HTN, DM2 COPD, asthma, GERD, hypothyroidism, remote history of DVT (1980)  who presented to the ED on 04/14/2023 for fall and back pain. Admitted for scalp hematoma and lumbar/sacral fractures. Cardiology was consulted for further discussion regarding continuation of anticoagulation.   Patient reports that she slipped and fell at home earlier this morning.  She believes that something fell out of her refrigerator and was wet in the floor and this is what she slipped in.  She was recently hospitalized last week for pleural effusion s/p thoracentesis and was discharged on 11/6.  She states that overall she has been having issues with falls, these have reportedly been mechanical.  Denies any dizziness or syncope preceding fall.  Workup in the ED notable for creatinine 1.02, potassium 4.4, sodium 132, hemoglobin 12.8, WBC 12.3.  CT head with no acute intracranial abnormality, right posterior scalp hematoma.  CT spine with lumbar/sacral fractures.  Patient seen this afternoon.  Resting comfortably in bed with son present at bedside.  Endorses back pain but otherwise has no complaints.  Denies any chest pain, shortness of breath, dizziness, syncope, lower extremity edema.  Discussed her recent falls.  States that she has occasional palpitations but denies any recent episodes.  Last episode she can recall was roughly 1 month ago.  Discussed indications for anticoagulation in detail, as well as risks and benefits of continuing versus discontinuing Eliquis.  Review of systems complete and found to be negative unless  listed above    Past Medical History:  Diagnosis Date   Anemia    Asthma    COPD (chronic obstructive pulmonary disease) (HCC)    Diabetes mellitus without complication (HCC)    type 2   DVT (deep venous thrombosis) (HCC) 1980   GERD (gastroesophageal reflux disease)    History of hiatal hernia    Hypertension    Hypothyroidism     Past Surgical History:  Procedure Laterality Date   CATARACT EXTRACTION Bilateral    CHOLECYSTECTOMY     COLONOSCOPY     ESOPHAGOGASTRODUODENOSCOPY (EGD) WITH PROPOFOL N/A 01/17/2022   Procedure: ESOPHAGOGASTRODUODENOSCOPY (EGD) WITH PROPOFOL;  Surgeon: Jaynie Collins, DO;  Location: [facility] ENDOSCOPY;  Service: Gastroenterology;  Laterality: N/A;   HUMERUS SURGERY Left    fracture (7 surgeries total)   MENISCUS REPAIR Right 2019   THYROIDECTOMY     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; reconstruction from hip bone   TONSILECTOMY, ADENOIDECTOMY, BILATERAL MYRINGOTOMY AND TUBES     TOTAL KNEE ARTHROPLASTY Right 03/14/2022   Procedure: TOTAL KNEE ARTHROPLASTY;  Surgeon: Reinaldo Berber, MD;  Location: [facility] ORS;  Service: Orthopedics;  Laterality: Right;   TOTAL SHOULDER REPLACEMENT Left    TUBAL LIGATION     WRIST SURGERY Left    multiple fractures from falls (14 surgeries from wrist to shoulder)    (Not in a hospital admission)  Social History   Socioeconomic History   Marital status: Widowed    Spouse name: Not on file   Number of children: 6   Years of education: Not on file   Highest education level: Not on file  Occupational History  Not on file  Tobacco Use   Smoking status: Never   Smokeless tobacco: Never  Vaping Use   Vaping status: Never Used  Substance and Sexual Activity   Alcohol use: Never   Drug use: Never   Sexual activity: Not Currently  Other Topics Concern   Not on file  Social History Narrative   Lives in senior housing in [place] (apt[place]   Social Determinants of Health   Financial Resource Strain: Not on  file  Food Insecurity: No Food Insecurity (04/13/2023)   Hunger Vital Sign    Worried About Running Out of Food in the Last Year: Never true    Ran Out of Food in the Last Year: Never true  Transportation Needs: No Transportation Needs (04/08/2023)   PRAPARE - Administrator, Civil Service (Medical): No    Lack of Transportation (Non-Medical): No  Physical Activity: Not on file  Stress: Not on file  Social Connections: Not on file  Intimate Partner Violence: Not At Risk (04/13/2023)   Humiliation, Afraid, Rape, and Kick questionnaire    Fear of Current or Ex-Partner: No    Emotionally Abused: No    Physically Abused: No    Sexually Abused: No    Family History  Problem Relation Age of Onset   Kidney disease Mother    Tuberculosis Father      Vitals:   04/14/23 1000 04/14/23 1030 04/14/23 1100 04/14/23 1230  BP: (!) 91/50 (!) 95/46 (!) 96/58 (!) 106/50  Pulse: 61 61 61 62  Resp:   19   Temp:   97.9 F (36.6 C)   TempSrc:   Oral   SpO2: 96% 95% 95% 97%  Weight:      Height:        PHYSICAL EXAM General: Chronically ill appearing, well nourished, in no acute distress laying at an incline in hospital bed with son present at bedside. HEENT: Normocephalic and atraumatic. Neck: No JVD.  Lungs: Normal respiratory effort on room air. Clear bilaterally to auscultation. No wheezes, crackles, rhonchi.  Heart: HRRR. Normal S1 and S2 without gallops or murmurs.  Abdomen: Non-distended appearing.  Msk: Normal strength and tone for age. Extremities: Warm and well perfused. No clubbing, cyanosis. No edema.  Neuro: Alert and oriented X 3. Psych: Answers questions appropriately.   Labs: Basic Metabolic Panel: Recent Labs    04/13/23 1213 04/14/23 0627  NA 132* 132*  K 3.8 4.4  CL 94* 95*  CO2 26 26  GLUCOSE 153* 199*  BUN 24* 34*  CREATININE 0.82 1.02*  CALCIUM 8.8* 8.8*   Liver Function Tests: Recent Labs    04/13/23 1213 04/14/23 0627  AST 24 29  ALT 20  14  ALKPHOS 42 37*  BILITOT 0.3 0.7  PROT 7.3 7.1  ALBUMIN 3.8 3.4*   No results for input(s): "LIPASE", "AMYLASE" in the last 72 hours. CBC: Recent Labs    04/13/23 1213 04/14/23 0627  WBC 10.3 12.3*  NEUTROABS 7.7 10.2*  HGB 13.1 12.8  HCT 39.6 38.7  MCV 92.3 92.8  PLT 181 156   Cardiac Enzymes: Recent Labs    04/14/23 0627  CKTOTAL 57   BNP: No results for input(s): "BNP" in the last 72 hours. D-Dimer: No results for input(s): "DDIMER" in the last 72 hours. Hemoglobin A1C: No results for input(s): "HGBA1C" in the last 72 hours. Fasting Lipid Panel: No results for input(s): "CHOL", "HDL", "LDLCALC", "TRIG", "CHOLHDL", "LDLDIRECT" in the last 72 hours. Thyroid Function  Tests: No results for input(s): "TSH", "T4TOTAL", "T3FREE", "THYROIDAB" in the last 72 hours.  Invalid input(s): "FREET3" Anemia Panel: Recent Labs    04/13/23 1213  RETICCTPCT 1.8     Radiology: CT L-SPINE NO CHARGE  Result Date: 04/14/2023 CLINICAL DATA:  77 year old female fell, found down. On blood thinners. Head hematoma. EXAM: CT LUMBAR SPINE WITHOUT CONTRAST TECHNIQUE: Technique: Multiplanar CT images of the lumbar spine were reconstructed from contemporary CT of the Abdomen and Pelvis. RADIATION DOSE REDUCTION: This exam was performed according to the departmental dose-optimization program which includes automated exposure control, adjustment of the mA and/or kV according to patient size and/or use of iterative reconstruction technique. CONTRAST:  None COMPARISON:  CT thoracic, CT Chest, Abdomen, and Pelvis today reported separately. FINDINGS: Segmentation: Normal. Alignment: Mild dextroconvex upper and levoconvex lower lumbar scoliosis. Straightening of lordosis. No significant spondylolisthesis. Vertebrae: Diffuse osteopenia. L1, L2, and L3 vertebrae appear intact. Mild L4 superior endplate compression (sagittal image 64, it is eccentric to the right (coronal image 48). L4 vertebra otherwise  intact. L5 vertebra appears intact. Superior sacrum and SI joints appear intact. But there is a subtle, minimally displaced fracture of the lower sacrum in the midline S5 (sagittal image 62). Paraspinal and other soft tissues: Abdomen and pelvis are detailed separately. Lumbar paraspinal soft tissues are within normal limits. Disc levels: Lumbar spine degeneration with multilevel vacuum disc. Moderate to severe disc space loss with disc osteophyte disease L2-L3 through L3-L4. Estimated lumbar spinal stenosis by CT as follows L2-L3: Mild to moderate, and with lateral recess stenosis greater on the left. L3-L4:  Mild-to-moderate. L4-L5:  Moderate to severe (series 2, image 82). IMPRESSION: 1. Osteopenia. Positive for a minimally displaced acute fracture of the lower sacrum at S5. And a mild L4 superior endplate compression fracture is age indeterminate. 2. Advanced lumbar spine disc and endplate degeneration contributing to multilevel spinal stenosis which is moderate to severe at L4-L5 and mild to moderate at L2-L3 and L3-L4. 3. CT Abdomen and Pelvis today reported separately. Electronically Signed   By: Odessa Fleming M.D.   On: 04/14/2023 07:19   CT T-SPINE NO CHARGE  Result Date: 04/14/2023 CLINICAL DATA:  77 year old female fell, found down. On blood thinners. Head hematoma. EXAM: CT THORACIC SPINE WITHOUT CONTRAST TECHNIQUE: Multiplanar CT images of the thoracic spine were reconstructed from contemporary CT of the Chest. RADIATION DOSE REDUCTION: This exam was performed according to the departmental dose-optimization program which includes automated exposure control, adjustment of the mA and/or kV according to patient size and/or use of iterative reconstruction technique. CONTRAST:  None COMPARISON:  CT cervical spine, CT Chest, Abdomen, and Pelvis today reported separately. Recent chest CT 04/07/2023. FINDINGS: Limited cervical spine imaging:  Reported separately. Thoracic spine segmentation: Appears normal;  hypoplastic left 12th rib. Alignment: Stable thoracic kyphosis from earlier this month. No significant scoliosis or spondylolisthesis. Vertebrae: Chronic osteopenia. Subtle loss of height or compression at T5, T6, T11 is unchanged from earlier this month. No acute thoracic vertebral fracture identified. Visible posterior ribs appear intact. Paraspinal and other soft tissues: Chest and abdomen are detailed separately. Thoracic paraspinal soft tissues remain within normal limits. Disc levels: Thoracic spine degeneration although mostly anterior flowing endplate osteophytes. Partially calcified disc and/or endplate spurring at T10-T11 with evidence of mild chronic thoracic spinal stenosis there. Superimposed facet hypertrophy. Spinal stenosis could be mild or moderate. IMPRESSION: 1. Stable CT appearance of the Thoracic Spine. Osteopenia, No acute traumatic injury identified. 2. 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 3. CT Chest, Abdomen, and Pelvis today reported separately. Electronically Signed   By: Odessa Fleming M.D.   On: 04/14/2023 07:14   CT CHEST ABDOMEN PELVIS WO CONTRAST  Result Date: 04/14/2023 CLINICAL DATA:  77 year old female fell, found down. On blood thinners. Hematoma. EXAM: CT CHEST, ABDOMEN AND PELVIS WITHOUT CONTRAST TECHNIQUE: Multidetector CT imaging of the chest, abdomen and pelvis was performed following the standard protocol without IV contrast. RADIATION DOSE REDUCTION: This exam was performed according to the departmental dose-optimization program which includes automated exposure control, adjustment of the mA and/or kV according to patient size and/or use of iterative reconstruction technique. COMPARISON:  Thoracic and lumbar CT today reported separately. Recent chest CT 04/07/2023. FINDINGS: CT CHEST FINDINGS Cardiovascular: Calcified aortic atherosclerosis. Calcified coronary artery atherosclerosis. Stable noncontrast appearance of the heart and aorta. No  cardiomegaly or pericardial effusion. Mediastinum/Nodes: Stable noncontrast mediastinum. No evidence of mass, lymphadenopathy, mediastinal hematoma. Lungs/Pleura: Layering right pleural effusion with simple fluid density remains moderate to large, slightly decreased compared to 04/07/2023. No contralateral left pleural effusion. Major airways remain patent. Compressive right lung atelectasis. Right lower lobe ventilation has mildly improved. No pneumothorax. Musculoskeletal: Left shoulder arthroplasty. Chronic posttraumatic and/or degenerative changes at the right shoulder. Chronic left rib fractures. Thoracic spine is reported separately. No sternal fracture. CT ABDOMEN PELVIS FINDINGS Hepatobiliary: Nodular liver contour suggestive of cirrhosis (series 2, image 69). No discrete liver lesion in the absence of IV contrast. Cholecystectomy. Pancreas: Negative. Spleen: Negative, no splenomegaly.  No perisplenic fluid. Adrenals/Urinary Tract: Negative noncontrast adrenal glands, kidneys, bladder. Pelvic phleboliths. Stomach/Bowel: Postoperative changes to the right ventral abdominal wall, probably chronic cholecystectomy related. Small fat containing umbilical hernia series 2, image 83. Nearby ventral abdominal wall subcutaneous fat stranding which is probably subcutaneous injection sites (series 2, image 93). No herniated bowel. No dilated large or small bowel. Severe diverticulosis of the sigmoid colon in the pelvis. No active large bowel inflammation. Appendicoliths on series 2, image 93, but otherwise normal appendix (coronal image 44). Decompressed terminal ileum. Decompressed stomach. No free air or free fluid. No mesenteric inflammation identified. Vascular/Lymphatic: Extensive Aortoiliac calcified atherosclerosis. Vascular patency is not evaluated in the absence of IV contrast. Normal caliber abdominal aorta. No lymphadenopathy identified. Reproductive: Retroverted, otherwise negative noncontrast appearance.  Other: No pelvis free fluid. Musculoskeletal: Lumbar spine is detailed separately. Sacrum, SI joints, pelvis, and proximal femurs appear intact. No superficial soft tissue injury identified. IMPRESSION: 1. No acute traumatic injury identified in the noncontrast chest, abdomen, or pelvis. See CT thoracic and lumbar spine reported separately. 2. Moderate to large layering right pleural effusion has slightly decreased compared to 04/07/2023. Simple fluid density favors transudate (see #3). Compressive right lung atelectasis. 3. Nodular liver contour strongly suggests Cirrhosis. But no ascites, no splenomegaly. 4.  Aortic Atherosclerosis (ICD10-I70.0). Electronically Signed   By: Odessa Fleming M.D.   On: 04/14/2023 07:09   CT Cervical Spine Wo Contrast  Result Date: 04/14/2023 CLINICAL DATA:  77 year old female fell, found down. On blood thinners. Hematoma. EXAM: CT CERVICAL SPINE WITHOUT CONTRAST TECHNIQUE: Multidetector CT imaging of the cervical spine was performed without intravenous contrast. Multiplanar CT image reconstructions were also generated. RADIATION DOSE REDUCTION: This exam was performed according to the departmental dose-optimization program which includes automated exposure control, adjustment of the mA and/or kV according to patient size and/or use of iterative reconstruction technique. COMPARISON:  Head CT today.  Cervical spine CT 03/05/2023. FINDINGS: Alignment: Straightening of cervical lordosis. Cervicothoracic junction alignment is within normal limits.  Bilateral posterior element alignment is within normal limits. Skull base and vertebrae: Visualized skull base is intact. No atlanto-occipital dissociation. C1 and C2 appear intact and aligned. No acute osseous abnormality identified. Soft tissues and spinal canal: No prevertebral fluid or swelling. No visible canal hematoma. Partially retropharyngeal carotids. Calcified atherosclerosis. Mild pharynx motion artifact. Disc levels: Stable cervical  spine degeneration. Multilevel disc and endplate degeneration with some ligamentous hypertrophy. 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 Upper chest: Thoracic spine and chest CT today reported separately. IMPRESSION: 1. No acute traumatic injury identified in the cervical spine. 2. 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 Electronically Signed   By: Odessa Fleming M.D.   On: 04/14/2023 06:59   CT HEAD WO CONTRAST ( )  Result Date: 04/14/2023 CLINICAL DATA:  77 year old female fell, found down. On blood thinners. Head hematoma. EXAM: CT HEAD WITHOUT CONTRAST TECHNIQUE: Contiguous axial images were obtained from the base of the skull through the vertex without intravenous contrast. RADIATION DOSE REDUCTION: This exam was performed according to the departmental dose-optimization program which includes automated exposure control, adjustment of the mA and/or kV according to patient size and/or use of iterative reconstruction technique. COMPARISON:  Head CT 03/05/2023. FINDINGS: Brain: Cerebral volume is stable, within normal limits for age. No midline shift, ventriculomegaly, mass effect, evidence of mass lesion, intracranial hemorrhage or evidence of cortically based acute infarction. Stable mild for age patchy white matter hypodensity. Otherwise normal gray-white differentiation. Vascular: Calcified atherosclerosis at the skull base. No suspicious intracranial vascular hyperdensity. Skull: Stable and intact. Sinuses/Orbits: Visualized paranasal sinuses and mastoids are stable and well aerated. Other: There is a mild right posterior convexity scalp hematoma or contusion on series 3, image 35. Underlying calvarium appears intact. No other acute orbit or scalp soft tissue injury identified. IMPRESSION: 1. Right posterior convexity scalp hematoma or contusion. No skull fracture. 2. No acute intracranial abnormality. Stable and largely negative for age non  contrast CT appearance of the brain. Electronically Signed   By: Odessa Fleming M.D.   On: 04/14/2023 06:55   DG Chest Port 1 View  Result Date: 04/12/2023 CLINICAL DATA:  Cough, shortness of breath EXAM: PORTABLE CHEST 1 VIEW COMPARISON:  04/11/2023 FINDINGS: Heart size is normal. Dense mitral annulus calcifications. Benign calcified granulomatous mediastinal lymph nodes. Implantable loop recorder. No acute airspace opacity. Benign calcified nodule specific status post left shoulder reverse arthroplasty. IMPRESSION: No acute abnormality of the lungs in AP portable projection. Electronically Signed   By: Jearld Lesch M.D.   On: 04/12/2023 09:10   DG Chest Port 1 View  Result Date: 04/11/2023 CLINICAL DATA:  Pleural effusion. EXAM: PORTABLE CHEST 1 VIEW COMPARISON:  April 10, 2023. FINDINGS: The heart size and mediastinal contours are within normal limits. Status post left shoulder arthroplasty. Old left rib fractures are noted. Increased right basilar opacity is noted concerning for worsening effusion and associated atelectasis or infiltrate. Left lung is unremarkable. IMPRESSION: Increased right basilar opacity is noted concerning for worsening effusion and associated atelectasis or infiltrate. Electronically Signed   By: Lupita Raider M.D.   On: 04/11/2023 08:51   ECHOCARDIOGRAM COMPLETE  Result Date: 04/10/2023    ECHOCARDIOGRAM REPORT   Patient Name:   JYL REIFER Date of Exam: 04/09/2023 Medical Rec #:  [id number]      Height:       62.0 in Accession #:    [id number]     Weight:       141.5 lb Date of  Birth:  June 07, 1945       BSA:          1.650 m Patient Age:    77 years       BP:           121/76 mmHg Patient Gender: F              HR:           58 bpm. Exam Location:  [facility] Procedure: 2D Echo, Cardiac Doppler and Color Doppler Indications:    DOE R06.0  History:        Patient has prior history of Echocardiogram examinations, most                 recent 05/24/2022. COPD, Arrythmias:Atrial  Fibrillation; Risk                 Factors:Hypertension and Diabetes.  Sonographer:    Dondra Prader RVT RCS Referring Phys: [id number] Sunnie Nielsen  Sonographer Comments: Patient unable to tolerate probe pressure on abdomen; no subcostal views. IMPRESSIONS  1. Left ventricular ejection fraction, by estimation, is 55 to 60%. The left ventricle has normal function. The left ventricle has no regional wall motion abnormalities. Left ventricular diastolic parameters are consistent with Grade I diastolic dysfunction (impaired relaxation).  2. Right ventricular systolic function is normal. The right ventricular size is normal. There is normal pulmonary artery systolic pressure.  3. Left atrial size was mildly dilated.  4. The mitral valve is normal in structure. Mild mitral valve regurgitation. Mild to moderate mitral stenosis. The mean mitral valve gradient is 6.0 mmHg. Severe mitral annular calcification.  5. The aortic valve is normal in structure. Aortic valve regurgitation is trivial. No aortic stenosis is present.  6. The inferior vena cava is normal in size with greater than 50% respiratory variability, suggesting right atrial pressure of 3 mmHg. FINDINGS  Left Ventricle: Left ventricular ejection fraction, by estimation, is 55 to 60%. The left ventricle has normal function. The left ventricle has no regional wall motion abnormalities. The left ventricular internal cavity size was normal in size. There is  no left ventricular hypertrophy. Left ventricular diastolic parameters are consistent with Grade I diastolic dysfunction (impaired relaxation). Right Ventricle: The right ventricular size is normal. No increase in right ventricular wall thickness. Right ventricular systolic function is normal. There is normal pulmonary artery systolic pressure. The tricuspid regurgitant velocity is 2.31 m/s, and  with an assumed right atrial pressure of 3 mmHg, the estimated right ventricular systolic pressure is 24.3 mmHg. Left  Atrium: Left atrial size was mildly dilated. Right Atrium: Right atrial size was normal in size. Pericardium: There is no evidence of pericardial effusion. Mitral Valve: The mitral valve is normal in structure. There is moderate thickening of the mitral valve leaflet(s). There is moderate calcification of the mitral valve leaflet(s). Severe mitral annular calcification. Mild mitral valve regurgitation. Mild  to moderate mitral valve stenosis. The mean mitral valve gradient is 6.0 mmHg. Tricuspid Valve: The tricuspid valve is normal in structure. Tricuspid valve regurgitation is mild . No evidence of tricuspid stenosis. Aortic Valve: The aortic valve is normal in structure. Aortic valve regurgitation is trivial. Aortic regurgitation PHT measures 600 msec. No aortic stenosis is present. Aortic valve mean gradient measures 7.0 mmHg. Aortic valve peak gradient measures 12.7 mmHg. Aortic valve area, by VTI measures 1.18 cm. Pulmonic Valve: The pulmonic valve was normal in structure. Pulmonic valve regurgitation is not visualized. No evidence of pulmonic stenosis.  Aorta: The aortic root is normal in size and structure. Venous: The inferior vena cava is normal in size with greater than 50% respiratory variability, suggesting right atrial pressure of 3 mmHg. IAS/Shunts: No atrial level shunt detected by color flow Doppler.  LEFT VENTRICLE PLAX 2D LVIDd:         4.30 cm   Diastology LVIDs:         3.10 cm   LV e' medial:    4.56 cm/s LV PW:         1.00 cm   LV E/e' medial:  24.6 LV IVS:        0.80 cm   LV e' lateral:   4.87 cm/s LVOT diam:     1.40 cm   LV E/e' lateral: 23.0 LV SV:         43 LV SV Index:   26 LVOT Area:     1.54 cm  RIGHT VENTRICLE RV Basal diam:  2.60 cm RV S prime:     11.30 cm/s TAPSE (M-mode): 1.7 cm LEFT ATRIUM             Index        RIGHT ATRIUM          Index LA diam:        4.40 cm 2.67 cm/m   RA Area:     7.81 cm LA Vol (A2C):   50.9 ml 30.84 ml/m  RA Volume:   13.80 ml 8.36 ml/m LA Vol  (A4C):   42.4 ml 25.69 ml/m LA Biplane Vol: 49.3 ml 29.87 ml/m  AORTIC VALVE                     PULMONIC VALVE AV Area (Vmax):    1.37 cm      PV Vmax:       1.06 m/s AV Area (Vmean):   1.26 cm      PV Peak grad:  4.5 mmHg AV Area (VTI):     1.18 cm AV Vmax:           178.00 cm/s AV Vmean:          116.000 cm/s AV VTI:            0.369 m AV Peak Grad:      12.7 mmHg AV Mean Grad:      7.0 mmHg LVOT Vmax:         158.00 cm/s LVOT Vmean:        94.800 cm/s LVOT VTI:          0.282 m LVOT/AV VTI ratio: 0.76 AI PHT:            600 msec  AORTA Ao Root diam: 2.80 cm Ao Asc diam:  3.60 cm MITRAL VALVE                TRICUSPID VALVE MV Area (PHT): 2.07 cm     TR Peak grad:   21.3 mmHg MV Mean grad:  6.0 mmHg     TR Vmax:        231.00 cm/s MV Decel Time: 366 msec MR Peak grad: 11.8 mmHg     SHUNTS MR Mean grad: 6.0 mmHg      Systemic VTI:  0.28 m MR Vmax:      172.00 cm/s   Systemic Diam: 1.40 cm MR Vmean:     116.0 cm/s MV E velocity: 112.00 cm/s MV A velocity: 167.00 cm/s MV E/A  ratio:  0.67 Lorine Bears MD Electronically signed by Lorine Bears MD Signature Date/Time: 04/10/2023/5:17:03 PM    Final    DG Chest Port 1 View  Result Date: 04/10/2023 CLINICAL DATA:  142230 Pleural effusion 142230. EXAM: PORTABLE CHEST 1 VIEW COMPARISON:  Chest radiograph 04/09/2023. FINDINGS: Decreased, trace residual right pleural effusion. Increasing airspace opacity in the medial aspect of the right lung base, suspicious for aspiration or developing infection. No pneumothorax. IMPRESSION: Decreased, trace residual right pleural effusion. Increasing airspace opacity in the medial aspect of the right lung base, suspicious for aspiration or developing infection. Electronically Signed   By: Orvan Falconer M.D.   On: 04/10/2023 12:47   DG Chest Port 1 View  Result Date: 04/09/2023 CLINICAL DATA:  Post thoracentesis EXAM: PORTABLE CHEST 1 VIEW COMPARISON:  CT chest 04/07/2023, chest x-ray 05/22/2022 FINDINGS: Left-sided  electronic recording device. Normal cardiac size. Mitral annular calcifications. Left lung grossly clear. Decreased right pleural effusion with small moderate residual pleural effusion. No convincing right pneumothorax. Airspace disease at the right base. Aortic atherosclerosis. Old appearing left-sided rib fractures. Left shoulder replacement IMPRESSION: Decreased right pleural effusion with small to moderate residual pleural effusion. No convincing pneumothorax. Airspace disease at the right base, atelectasis versus pneumonia. Electronically Signed   By: Jasmine Pang M.D.   On: 04/09/2023 16:27   US Abdomen Limited RUQ (LIVER/GB)  Result Date: 04/09/2023 CLINICAL DATA:  Cirrhosis EXAM: ULTRASOUND ABDOMEN LIMITED RIGHT UPPER QUADRANT COMPARISON:  None Available. FINDINGS: Gallbladder: Prior cholecystectomy. Common bile duct: Diameter: 6 mm. Liver: No focal lesion identified. Nodular liver contour. Normal parenchymal echogenicity. Portal vein is patent on color Doppler imaging with normal direction of blood flow towards the liver. Other: Right pleural effusion. IMPRESSION: 1. Cirrhotic liver morphology. 2. Right pleural effusion. Electronically Signed   By: Allegra Lai M.D.   On: 04/09/2023 12:59   DG Foot Complete Right  Result Date: 04/08/2023 CLINICAL DATA:  Right foot pain EXAM: RIGHT FOOT COMPLETE - 3+ VIEW COMPARISON:  None Available. FINDINGS: No fracture or dislocation is seen. Mild degenerative changes of the 1st MCP joint. Degenerative changes of the dorsal midfoot. Moderate posterior and plantar enthesophytes. IMPRESSION: No fracture or dislocation is seen. Degenerative changes, as above. Electronically Signed   By: Charline Bills M.D.   On: 04/08/2023 01:55   CT CHEST WO CONTRAST  Result Date: 04/07/2023 CLINICAL DATA:  Chest pain, shortness of breath EXAM: CT CHEST WITHOUT CONTRAST TECHNIQUE: Multidetector CT imaging of the chest was performed following the standard protocol without  IV contrast. RADIATION DOSE REDUCTION: This exam was performed according to the departmental dose-optimization program which includes automated exposure control, adjustment of the mA and/or kV according to patient size and/or use of iterative reconstruction technique. COMPARISON:  CT 05/23/2022 FINDINGS: Cardiovascular: Heart size is upper limits of normal. No pericardial effusion. Thoracic aorta is nonaneurysmal. There are atherosclerotic vascular calcifications of the aorta and coronary arteries. Calcification of the mitral annulus. Central pulmonary vasculature is nondilated. Mediastinum/Nodes: No enlarged mediastinal or axillary lymph nodes. Thyroid gland, trachea, and esophagus demonstrate no significant findings. Small hiatal hernia. Lungs/Pleura: Moderate-large layering right-sided pleural effusion with complete atelectasis of the right lower lobe and near-complete atelectasis of the right middle lobe. Partial compressive atelectasis of the right upper lobe. Mild mosaic attenuation of the aerated portion of the right upper lobe and within the left lung. No left-sided pleural effusion. No pneumothorax. Upper Abdomen: Nodular hepatic surface contour. No acute abnormality. Musculoskeletal: No chest wall mass or  suspicious bone lesions identified. Left chest wall loop recorder. IMPRESSION: 1. Moderate-large layering right-sided pleural effusion with complete atelectasis of the right lower lobe and near-complete atelectasis of the right middle lobe. Partial compressive atelectasis of the right upper lobe. 2. Mild mosaic attenuation of the aerated portion of the right upper lobe and within the left lung, which may be related to small airways disease. 3. Nodular hepatic surface contour, suggestive of cirrhosis. 4. Aortic and coronary artery atherosclerosis (ICD10-I70.0). Electronically Signed   By: Duanne Guess D.O.   On: 04/07/2023 15:25    ECHO 04/2023: 1. Left ventricular ejection fraction, by estimation,  is 55 to 60%. The  left ventricle has normal function. The left ventricle has no regional  wall motion abnormalities. Left ventricular diastolic parameters are  consistent with Grade I diastolic  dysfunction (impaired relaxation).   2. Right ventricular systolic function is normal. The right ventricular  size is normal. There is normal pulmonary artery systolic pressure.   3. Left atrial size was mildly dilated.   4. The mitral valve is normal in structure. Mild mitral valve  regurgitation. Mild to moderate mitral stenosis. The mean mitral valve  gradient is 6.0 mmHg. Severe mitral annular calcification.   5. The aortic valve is normal in structure. Aortic valve regurgitation is  trivial. No aortic stenosis is present.   6. The inferior vena cava is normal in size with greater than 50%  respiratory variability, suggesting right atrial pressure of 3 mmHg.   TELEMETRY reviewed by me 04/14/2023: not on tele  EKG reviewed by me: no EKG this admission  Data reviewed by me 04/14/2023: last 24h vitals tele labs imaging I/O ED provider note, admission H&P  Principal Problem:   Fall Active Problems:   COPD (chronic obstructive pulmonary disease) (HCC)   Hypothyroidism   Pleural effusion on right   Type II diabetes mellitus with renal manifestations (HCC)   Atrial fibrillation, chronic (HCC)   DVT (deep venous thrombosis) (HCC)   Lumbar compression fracture (HCC)    ASSESSMENT AND PLAN:  [name] is a 77 y.o. female  with a past medical history of atrial fibrillation, HTN, DM2 COPD, asthma, GERD, hypothyroidism, remote history of DVT (1980)  who presented to the ED on 04/14/2023 for fall and back pain. Admitted for scalp hematoma and lumbar/sacral fractures. Cardiology was consulted for further discussion regarding continuation of anticoagulation.   # Paroxysmal atrial fibrillation Patient with history of paroxysmal atrial fibrillation, hospitalized 05/2022 for AF RVR, discharged home  on amiodarone and metoprolol as well as Eliquis for stroke risk reduction.  Presents with multiple falls in the recent months.  Requesting to discuss continuation versus discontinuation of anticoagulation. -Discussed risk/benefits of discontinuation of anticoagulation including decreased bleeding risk but increase stroke risk related to atrial fibrillation. -PT to evaluate patient.  Discussed minimizing fall risk, using walker consistently. -Patient not ready to make decision about anticoagulation at this time.  Explained that would need plan prior to discharge.  # Multiple falls # Scalp hematoma # Lumbar/sacral fractures Patient with 4-5 falls in the last 6 months per son.  [facility] -PT to evaluate. -Rhythm management per primary.  This patient's plan of care was discussed and created with Dr. Melton Alar and she is in agreement.  Signed: Gale Journey, PA-C  04/14/2023, 2:30 PM [facility]

## 2023-04-14 NOTE — Assessment & Plan Note (Signed)
S/p thoracentesis 04/09/23 Pleural fluid analysis (+)leukocytosis monocyte predominant, and significant elevation in LDH. DDx infectious etiologies, malignancy Following by Dr. Cathie Hoops w/ oncology w/ concern for lymphoma  Stable from a resp standpoint  Monitor

## 2023-04-14 NOTE — Assessment & Plan Note (Addendum)
Mechanical fall, pt slipped on spilled pineapple juice on the floor. Multiple prior recurrent falls at home with noted moderate scalp hematoma as well as lumbar and sacral fractures on imaging this admission. --Fall precautions --PT OT evaluation --Anticipate need for SNF

## 2023-04-14 NOTE — Assessment & Plan Note (Signed)
Continue Synthroid °

## 2023-04-14 NOTE — ED Notes (Signed)
Fall risk bracelet placed on pt's arm. Pt does have yellow fall socks on already.

## 2023-04-14 NOTE — ED Notes (Addendum)
OT at bedside. Patient up to restroom

## 2023-04-14 NOTE — ED Notes (Signed)
Multiple pillows provided to pt. Per request.

## 2023-04-14 NOTE — Evaluation (Addendum)
Occupational Therapy Evaluation Patient Details Name: [name] MRN: [id number] DOB: 10/04/1945 Today's Date: 04/14/2023   History of Present Illness 77 yo female brought to ED from home s/p fall on spilt juice with inability to get up until 3am when able to call 911 with severe pain to back and pelvis and R posterior head hematoma. 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   Clinical Impression   Pt was seen for OT evaluation this date. Prior to hospital admission, pt was lives in a 1 level apartment. She participates with [facility] program during the day. IND PLOF with cane for mobility up until recently d/t falls and pleural effusion. One recent fall in the shower, along with 2 others from slipping on spilt vinegar and pineapple juice. Has been unable to complete her laundry over the past 2 weeks 2/2 SOB and fatigue along with citing weather as hindering her breathing.  Pt presents to acute OT demonstrating impaired ADL performance and functional mobility 2/2 pain, weakness, balance deficits/limited safety awareness and limited activity tolerance (See OT problem list for additional functional deficits). 10/10 pain at times in her back and tailbone with activity. Pt currently requires Mod A to get out of high hospital gurney. STS and mobility using RW with L forearm use CGA. Min/CGA for toilet transfer using grab bar. Min A for clothing management for pants from floor level, then pt able to manage up and over hips. Educated on limitined bending, twisting and lifting d/t fxs to reduce pain/further damage to back. Mentioned reacher and other  long handled equipment to increase ease and decrease positional changes as well as strain on back with pt verbalizing understanding. SUP for anterior hygiene.  Pt would benefit from skilled OT services to address noted impairments and functional limitations (see below for any additional details) in order to maximize safety and independence while minimizing falls risk and caregiver burden. Do anticipate the need for follow up OT services upon acute hospital DC with recommendation for HHOT with increased assist/supervision at home d/t multiple falls.        If plan is discharge home, recommend the following: A little help with walking and/or transfers;A little help with bathing/dressing/bathroom;Assistance with cooking/housework;Assist for transportation;Help with stairs or ramp for entrance    Functional Status Assessment  Patient has had a recent decline in their functional status and demonstrates the ability to make significant improvements in function in a reasonable and predictable amount of time.  Equipment Recommendations  Other (comment) (defer)    Recommendations for Other Services       Precautions / Restrictions        Mobility Bed Mobility Overal bed mobility: Needs Assistance Bed Mobility: Supine to Sit, Sit to Supine     Supine to sit: Min assist, Mod assist, HOB elevated Sit to supine: Min assist, Mod assist   General bed mobility comments: increased time and required assist 2/2 pain in back and tailbone    Transfers Overall transfer level: Needs assistance Equipment used: Rolling walker (2 wheels) Transfers: Sit to/from Stand Sit to Stand: Contact guard assist           General transfer  comment: CGA for STS from high gurney and Min/CGA from toilet in ED using grab bar      Balance Overall balance assessment: Needs assistance, History of Falls Sitting-balance support: Feet supported, Bilateral upper extremity supported Sitting balance-Leahy Scale:  Fair Sitting balance - Comments: hard to assess from high gurney   Standing balance support: During functional activity, Reliant on assistive device for balance, Bilateral upper extremity supported Standing balance-Leahy Scale: Fair Standing balance comment: LUE placed forearm on RW versus pushing weight through hand; reliant on RW and CGA from therapist for stability; on RA sp02 remained 92-95%                           ADL either performed or assessed with clinical judgement   ADL Overall ADL's : Needs assistance/impaired                     Lower Body Dressing: Sit to/from stand;Contact guard assist   Toilet Transfer: Contact guard assist;Rolling walker (2 wheels);Regular Toilet;Grab bars;Minimal assistance   Toileting- Clothing Manipulation and Hygiene: Sit to/from stand;Minimal assistance;Sitting/lateral lean Toileting - Clothing Manipulation Details (indicate cue type and reason): to get pants from floor to pull up d/t pain and new fx at s5     Functional mobility during ADLs: Rolling walker (2 wheels);Contact guard assist       Vision         Perception         Praxis         Pertinent Vitals/Pain Pain Assessment Pain Assessment: 0-10 Pain Score: 10-Worst pain ever Pain Location: up to 10/10 in her tailbone and back with movement Pain Descriptors / Indicators: Grimacing, Guarding Pain Intervention(s): Monitored during session, Limited activity within patient's tolerance     Extremity/Trunk Assessment Upper Extremity Assessment Upper Extremity Assessment: Generalized weakness   Lower Extremity Assessment Lower Extremity Assessment: Generalized weakness       Communication Communication Communication: Hearing impairment (wears hearing aid) Cueing Techniques: Verbal cues   Cognition Arousal: Alert Behavior During Therapy: WFL for tasks assessed/performed Overall Cognitive Status: Within Functional Limits for tasks assessed                                        General Comments  BP back to normal following activity 118/56; pain limiting activty    Exercises Other Exercises Other Exercises: Edu on role of OT in acute setting as well as lifting, bending and twisting precautions to reduce pain in her back.   Shoulder Instructions      Home Living Family/patient expects to be discharged to:: Private residence Living Arrangements: Alone Available Help at Discharge: Family;Available PRN/intermittently Type of Home: Apartment Home Access: Level entry     Home Layout: One level     Bathroom Shower/Tub: Runner, broadcasting/film/video: Rollator (4 wheels);Cane - single point;Shower seat   Additional Comments: oxygen at night up until recently where she has been wearing 24/7 at home      Prior Functioning/Environment Prior Level of Function : Independent/Modified Independent             Mobility Comments: participates with [facility] program during the day. Mod I with cane for ambulation. walks a mile 3 days per week ADLs Comments: patient reports she does her own laundry,  ADLs.  one recent fall in the shower, along with 2 others from slipping on spilt vinegar and pineapple juice. Has been unable to complete her laundry over the past 2 weeks 2/2 SOB and fatigue along with citing weather as hindering her breathing.        OT Problem List: Decreased strength;Decreased activity tolerance;Decreased safety awareness;Impaired balance (sitting and/or standing);Decreased knowledge of use of DME or AE;Cardiopulmonary status limiting activity;Pain      OT Treatment/Interventions: Self-care/ADL training;Therapeutic exercise;Therapeutic activities;Energy conservation;DME and/or AE instruction;Patient/family education;Balance training    OT Goals(Current goals can be found in the care plan section) Acute Rehab OT Goals Patient Stated Goal: return home safely OT Goal Formulation: With patient Time  For Goal Achievement: 04/28/23 Potential to Achieve Goals: Good ADL Goals Pt Will Perform Lower Body Bathing: with supervision;sitting/lateral leans;sit to/from stand Pt Will Perform Lower Body Dressing: with supervision;with adaptive equipment;sitting/lateral leans;sit to/from stand Pt Will Transfer to Toilet: with supervision;ambulating;grab bars;regular height toilet Pt Will Perform Toileting - Clothing Manipulation and hygiene: with modified independence;sit to/from stand;sitting/lateral leans Additional ADL Goal #1: Pt will verbalize/demo ability to use energy conservation and compensatory strategies for pain and breathing management to improve overall safety, IND and minimize risk of over exertion and increased pain for safe return home.  OT Frequency: Min 1X/week    Co-evaluation              AM-PAC OT "6 Clicks" Daily Activity     Outcome Measure Help from another person eating meals?: None Help from another person taking care of personal grooming?: A Little Help from another person toileting, which includes using toliet, bedpan, or urinal?: A Little Help from another person bathing (including washing, rinsing, drying)?: A Little Help from another person to put on and taking off regular upper body clothing?: None Help from another person to put on and taking off regular lower body clothing?: A Little 6 Click Score: 20   End of Session Equipment Utilized During Treatment: Rolling walker (2 wheels) Nurse Communication: Mobility status  Activity Tolerance: Patient tolerated treatment well;Patient limited by pain Patient left: in bed;with call bell/phone within reach;with nursing/sitter in room  OT Visit Diagnosis: Other abnormalities of gait and mobility (R26.89);Muscle weakness (generalized) (M62.81);Unsteadiness on feet (R26.81);Repeated falls (R29.6);History of falling (Z91.81)                Time: 1610-9604 OT Time Calculation (min): 17 min Charges:  OT General  Charges $OT Visit: 1 Visit OT Evaluation $OT Eval Low Complexity: 1 Low Cherity Blickenstaff, OTR/L 04/14/23, 12:56 PM Karlee Staff E Aveah Castell 04/14/2023, 12:50 PM

## 2023-04-14 NOTE — H&P (Addendum)
History and Physical    Patient: [name] GNF:[id number] DOB: 05-Nov-1945 DOA: 04/14/2023 DOS: the patient was seen and examined on 04/14/2023 PCP: [facility]  Patient coming from: Home  Chief Complaint:  Chief Complaint  Patient presents with   Fall   Back Pain   HPI: [name] is a 77 y.o. female with medical history significant of a fib and DVT on Eliquis, HTN, diet-controlled diabetes, COPD on 2 L oxygen at night, hypothyroidism, GERD, depression, anemia, CKD-3A presenting with recurrent falls, lumbosacral fracture.  Patient currently lives at home alone.  Noted to have been recently admitted November 1 through November 6 for right-sided pleural effusion with concern for malignancy.  Per the patient, had a can of pineapples when some of the juice got on the floor patient slipped and fell landing on her back and head.  Positive head trauma.  Patient did reports multiple recent falls over multiple months.  Positive generalized weakness.  Does not express any acute weakness prior to fall.  No chest pain or shortness of breath.  Is had significant pain in the lumbosacral area after the fall.  No bowel or bladder anesthesia/incontinence.  Still taking Eliquis.  Significant pain with ambulation. Presented to the ER afebrile, hemodynamically stable.  Satting well on room air.  White count 12.3, hemoglobin 12.8, platelets 156, creatinine 1.02, glucose 199, CK 57.  CT head and CT C-spine grossly within normal limits.  CT abdomen pelvis grossly stable apart from moderately to large layering right pleural effusion decreasing compared to prior CT scan.  Positive cirrhotic changes.  CT L-spine showing displaced acute fracture of lower sacrum at S5 as well as mild L4 superior endplate compression fracture.  Advanced lumbar spinal disc disease and multilevel spinal stenosis moderate to severe at L4-L5. Review of Systems: As mentioned in the history of present illness. All other  systems reviewed and are negative. Past Medical History:  Diagnosis Date   Anemia    Asthma    COPD (chronic obstructive pulmonary disease) (HCC)    Diabetes mellitus without complication (HCC)    type 2   DVT (deep venous thrombosis) (HCC) 1980   GERD (gastroesophageal reflux disease)    History of hiatal hernia    Hypertension    Hypothyroidism    Past Surgical History:  Procedure Laterality Date   CATARACT EXTRACTION Bilateral    CHOLECYSTECTOMY     COLONOSCOPY     ESOPHAGOGASTRODUODENOSCOPY (EGD) WITH PROPOFOL N/A 01/17/2022   Procedure: ESOPHAGOGASTRODUODENOSCOPY (EGD) WITH PROPOFOL;  Surgeon: Jaynie Collins, DO;  Location: [facility] ENDOSCOPY;  Service: Gastroenterology;  Laterality: N/A;   HUMERUS SURGERY Left    fracture (7 surgeries total)   MENISCUS REPAIR Right 2019   THYROIDECTOMY     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 She has never used smokeless tobacco. She reports that she does not drink alcohol and does not use drugs.  Allergies  Allergen Reactions   Cephalosporins Itching   Codeine Other (See Comments)  Extreme drowsiness   Contrast Media [Iodinated Contrast Media] Hives and Swelling    Facial swelling   Oxycodone Other (See Comments)    Extreme drowsiness    Family History  Problem Relation Age of Onset   Kidney disease Mother    Tuberculosis Father     Prior to Admission medications   Medication Sig Start Date End Date Taking? Authorizing Provider  acetaminophen (TYLENOL) 325 MG  tablet Take 1,300 mg by mouth every 6 (six) hours as needed for moderate pain.   Yes [provider]  ADVAIR HFA 115-21 MCG/ACT inhaler Inhale 2 puffs into the lungs 2 (two) times daily. 04/07/23  Yes [provider]  albuterol (PROVENTIL) (2.5 MG/3ML) 0.083% nebulizer solution Take 2.5 mg by nebulization every 4 (four) hours as needed for wheezing or shortness of breath.   Yes [provider]  albuterol (VENTOLIN HFA) 108 (90 Base) MCG/ACT inhaler Inhale 2-4 puffs by mouth every 4 hours as needed for wheezing, cough, and/or shortness of breath 11/28/20  Yes Loleta Rose, MD  amiodarone (PACERONE) 100 MG tablet Take 1 tablet (100 mg total) by mouth daily. 04/12/23  Yes Alford Highland, MD  amoxicillin-clavulanate (AUGMENTIN) 875-125 MG tablet Take 1 tablet by mouth 2 (two) times daily with a meal for 3 days. 04/12/23 04/15/23 Yes Wieting, Richard, MD  apixaban (ELIQUIS) 5 MG TABS tablet Take 1 tablet (5 mg total) by mouth 2 (two) times daily. 05/24/22  Yes Pennie Banter, DO  Ascorbic Acid (VITAMIN C CR) 1500 MG TBCR Take 1 tablet by mouth daily.   Yes [provider]  azithromycin (ZITHROMAX) 250 MG tablet Take 1 tablet (250 mg total) by mouth daily. 04/13/23  Yes Wieting, Richard, MD  Carboxymeth-Glycerin-Polysorb (REFRESH OPTIVE ADVANCED) 0.5-1-0.5 % SOLN Apply 2 drops to eye daily.   Yes [provider]  Emollient (EUCERIN) lotion Apply 1 Application topically as needed for dry skin.   Yes [provider]  enalapril (VASOTEC) 20 MG tablet Take 20 mg by mouth daily.   Yes [provider]  esomeprazole (NEXIUM) 40 MG capsule Take 40 mg by mouth 2 (two) times daily. 05/06/22  Yes [provider]  ferrous sulfate 325 (65 FE) MG tablet Take 325 mg by mouth daily.   Yes [provider]  fluticasone (FLONASE) 50 MCG/ACT nasal spray Place 2 sprays into both nostrils daily as needed for allergies or rhinitis.   Yes [provider]  furosemide (LASIX) 20 MG tablet Take 1 tablet (20 mg total) by mouth 2 (two) times daily with breakfast and lunch. 04/12/23  Yes Alford Highland, MD  guaiFENesin (MUCINEX) 600 MG 12 hr tablet Take 600 mg by mouth 2 (two) times daily as needed for to loosen phlegm or cough.   Yes [provider]  ipratropium-albuterol (DUONEB) 0.5-2.5 (3) MG/3ML SOLN Take 3 mLs by nebulization every 4 (four) hours as needed. 02/14/23  Yes [provider]  ketoconazole (NIZORAL) 2 % shampoo Apply 1 Application topically 2 (two) times a week.   Yes [provider]  ketotifen (ZADITOR) 0.025 % ophthalmic solution Place 1 drop into both eyes daily as needed.   Yes [provider]  levothyroxine (SYNTHROID) 125 MCG tablet Take 125 mcg by mouth daily before breakfast.   Yes [provider]  loperamide (IMODIUM) 2 MG capsule Take 2 mg by mouth as needed for diarrhea or loose stools. 11/29/22  Yes [provider]  loratadine (CLARITIN) 10 MG tablet Take 1 tablet (  10 mg total) by mouth daily. Patient taking differently: Take 10 mg by mouth daily as needed for allergies, rhinitis or itching. 05/25/22  Yes Esaw Grandchild A, DO  Magnesium 250 MG TABS Take 1 tablet by mouth daily.   Yes [provider]  metoprolol tartrate (LOPRESSOR) 25 MG tablet Take 1 tablet (25 mg total) by mouth 2 (two) times daily. 05/24/22  Yes Esaw Grandchild A, DO  Omega-3 1000 MG CAPS Take 1 capsule by mouth daily.   Yes [provider]  potassium chloride (KLOR-CON) 10 MEQ tablet Take 10 mEq by mouth 2 (two) times daily. 04/07/23  Yes [provider]  sodium chloride (OCEAN) 0.65 % SOLN nasal spray Place 1 spray into both nostrils as needed for congestion. 05/24/22  Yes Esaw Grandchild A, DO  SYSTANE ULTRA 0.4-0.3 % SOLN Apply to eye. 04/07/23  Yes [provider]  traMADol (ULTRAM) 50 MG tablet Take 50 mg by mouth every 6 (six) hours as needed.  03/09/23  Yes [provider]  traZODone (DESYREL) 50 MG tablet Take 50 mg by mouth at bedtime as needed for sleep. 03/14/23  Yes [provider]  TURMERIC PO Take 1 capsule by mouth daily.   Yes [provider]  atorvastatin (LIPITOR) 20 MG tablet Take 20 mg by mouth daily.    [provider]  busPIRone (BUSPAR) 5 MG tablet Take 5 mg by mouth 3 (three) times daily as needed. Patient not taking: Reported on 04/14/2023    [provider]  metoprolol tartrate (LOPRESSOR) 25 MG tablet Take 0.5 tablets (12.5 mg total) by mouth 2 (two) times daily. Patient not taking: Reported on 04/13/2023 04/12/23   Alford Highland, MD  OXYGEN Inhale 2 L into the lungs at bedtime.    [provider]  rOPINIRole (REQUIP) 0.25 MG tablet Take 0.25 mg by mouth 2 (two) times daily. 03/30/23   [provider]  VITAMIN D-VITAMIN K PO Take 4,000 Units by mouth daily.    [provider]    Physical Exam: Vitals:   04/14/23 0940 04/14/23 1000 04/14/23 1030 04/14/23 1100  BP:  (!) 91/50 (!) 95/46 (!) 96/58  Pulse:  61 61 61  Resp:    19  Temp:    97.9 F (36.6 C)  TempSrc:    Oral  SpO2: 96% 96% 95% 95%  Weight:      Height:       Physical Exam  Data Reviewed:  There are no new results to review at this time.  CT L-SPINE NO CHARGE CLINICAL DATA:  77 year old female fell, found down. On blood thinners. Head hematoma.  EXAM: CT LUMBAR SPINE WITHOUT CONTRAST  TECHNIQUE: Technique: Multiplanar CT images of the lumbar spine were reconstructed from contemporary CT of the Abdomen and Pelvis.  RADIATION DOSE REDUCTION: This exam was performed according to the departmental dose-optimization program which includes automated exposure control, adjustment of the mA and/or kV according to patient size and/or use of iterative reconstruction technique.  CONTRAST:  None  COMPARISON:  CT thoracic, CT Chest, Abdomen, and Pelvis today reported  separately.  FINDINGS: Segmentation: Normal.  Alignment: Mild dextroconvex upper and levoconvex lower lumbar scoliosis. Straightening of lordosis. No significant spondylolisthesis.  Vertebrae: Diffuse osteopenia. L1, L2, and L3 vertebrae appear intact. Mild L4 superior endplate compression (sagittal image 64, it is eccentric to the right (coronal image 48). L4 vertebra otherwise intact.  L5 vertebra appears intact. Superior sacrum and SI joints appear intact. But there is a  subtle, minimally displaced fracture of the lower sacrum in the midline S5 (sagittal image 62).  Paraspinal and other soft tissues: Abdomen and pelvis are detailed separately. Lumbar paraspinal soft tissues are within normal limits.  Disc levels: Lumbar spine degeneration with multilevel vacuum disc. Moderate to severe disc space loss with disc osteophyte disease L2-L3 through L3-L4. Estimated lumbar spinal stenosis by CT as follows  L2-L3: Mild to moderate, and with lateral recess stenosis greater on the left.  L3-L4:  Mild-to-moderate.  L4-L5:  Moderate to severe (series 2, image 82).  IMPRESSION: 1. Osteopenia. Positive for a minimally displaced acute fracture of the lower sacrum at S5. And a mild L4 superior endplate compression fracture is age indeterminate. 2. Advanced lumbar spine disc and endplate degeneration contributing to multilevel spinal stenosis which is moderate to severe at L4-L5 and mild to moderate at L2-L3 and L3-L4. 3. CT Abdomen and Pelvis today reported separately.  Electronically Signed   By: Odessa Fleming M.D.   On: 04/14/2023 07:19 CT T-SPINE NO CHARGE CLINICAL DATA:  77 year old female fell, found down. On blood thinners. Head hematoma.  EXAM: CT THORACIC SPINE WITHOUT CONTRAST  TECHNIQUE: Multiplanar CT images of the thoracic spine were reconstructed from contemporary CT of the Chest.  RADIATION DOSE REDUCTION: This exam was performed according to the departmental  dose-optimization program which includes automated exposure control, adjustment of the mA and/or kV according to patient size and/or use of iterative reconstruction technique.  CONTRAST:  None  COMPARISON:  CT cervical spine, CT Chest, Abdomen, and Pelvis today reported separately. Recent chest CT 04/07/2023.  FINDINGS: Limited cervical spine imaging:  Reported separately.  Thoracic spine segmentation: Appears normal; hypoplastic left 12th rib.  Alignment: Stable thoracic kyphosis from earlier this month. No significant scoliosis or spondylolisthesis.  Vertebrae: Chronic osteopenia. Subtle loss of height or compression at T5, T6, T11 is unchanged from earlier this month. No acute thoracic vertebral fracture identified. Visible posterior ribs appear intact.  Paraspinal and other soft tissues: Chest and abdomen are detailed separately. Thoracic paraspinal soft tissues remain within normal limits.  Disc levels:  Thoracic spine degeneration although mostly anterior flowing endplate osteophytes.  Partially calcified disc and/or endplate spurring at T10-T11 with evidence of mild chronic thoracic spinal stenosis there. Superimposed facet hypertrophy. Spinal stenosis could be mild or moderate.  IMPRESSION: 1. Stable CT appearance of the Thoracic Spine. Osteopenia, No acute traumatic injury identified. 2. 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 3. CT Chest, Abdomen, and Pelvis today reported separately.  Electronically Signed   By: Odessa Fleming M.D.   On: 04/14/2023 07:14 CT CHEST ABDOMEN PELVIS WO CONTRAST CLINICAL DATA:  77 year old female fell, found down. On blood thinners. Hematoma.  EXAM: CT CHEST, ABDOMEN AND PELVIS WITHOUT CONTRAST  TECHNIQUE: Multidetector CT imaging of the chest, abdomen and pelvis was performed following the standard protocol without IV contrast.  RADIATION DOSE REDUCTION: This exam was performed according to  the departmental dose-optimization program which includes automated exposure control, adjustment of the mA and/or kV according to patient size and/or use of iterative reconstruction technique.  COMPARISON:  Thoracic and lumbar CT today reported separately. Recent chest CT 04/07/2023.  FINDINGS: CT CHEST FINDINGS  Cardiovascular: Calcified aortic atherosclerosis. Calcified coronary artery atherosclerosis. Stable noncontrast appearance of the heart and aorta. No cardiomegaly or pericardial effusion.  Mediastinum/Nodes: Stable noncontrast mediastinum. No evidence of mass, lymphadenopathy, mediastinal hematoma.  Lungs/Pleura: Layering right pleural effusion with simple fluid density remains moderate to large, slightly decreased compared to 04/07/2023.  No contralateral left pleural effusion. Major airways remain patent. Compressive right lung atelectasis. Right lower lobe ventilation has mildly improved. No pneumothorax.  Musculoskeletal: Left shoulder arthroplasty. Chronic posttraumatic and/or degenerative changes at the right shoulder. Chronic left rib fractures. Thoracic spine is reported separately. No sternal fracture.  CT ABDOMEN PELVIS FINDINGS  Hepatobiliary: Nodular liver contour suggestive of cirrhosis (series 2, image 69). No discrete liver lesion in the absence of IV contrast. Cholecystectomy.  Pancreas: Negative.  Spleen: Negative, no splenomegaly.  No perisplenic fluid.  Adrenals/Urinary Tract: Negative noncontrast adrenal glands, kidneys, bladder. Pelvic phleboliths.  Stomach/Bowel: Postoperative changes to the right ventral abdominal wall, probably chronic cholecystectomy related. Small fat containing umbilical hernia series 2, image 83. Nearby ventral abdominal wall subcutaneous fat stranding which is probably subcutaneous injection sites (series 2, image 93). No herniated bowel. No dilated large or small bowel. Severe diverticulosis of the sigmoid colon  in the pelvis. No active large bowel inflammation. Appendicoliths on series 2, image 93, but otherwise normal appendix (coronal image 44). Decompressed terminal ileum. Decompressed stomach. No free air or free fluid. No mesenteric inflammation identified.  Vascular/Lymphatic: Extensive Aortoiliac calcified atherosclerosis. Vascular patency is not evaluated in the absence of IV contrast. Normal caliber abdominal aorta. No lymphadenopathy identified.  Reproductive: Retroverted, otherwise negative noncontrast appearance.  Other: No pelvis free fluid.  Musculoskeletal: Lumbar spine is detailed separately. Sacrum, SI joints, pelvis, and proximal femurs appear intact. No superficial soft tissue injury identified.  IMPRESSION: 1. No acute traumatic injury identified in the noncontrast chest, abdomen, or pelvis. See CT thoracic and lumbar spine reported separately.  2. Moderate to large layering right pleural effusion has slightly decreased compared to 04/07/2023. Simple fluid density favors transudate (see #3). Compressive right lung atelectasis.  3. Nodular liver contour strongly suggests Cirrhosis. But no ascites, no splenomegaly.  4.  Aortic Atherosclerosis (ICD10-I70.0).  Electronically Signed   By: Odessa Fleming M.D.   On: 04/14/2023 07:09 CT Cervical Spine Wo Contrast CLINICAL DATA:  77 year old female fell, found down. On blood thinners. Hematoma.  EXAM: CT CERVICAL SPINE WITHOUT CONTRAST  TECHNIQUE: Multidetector CT imaging of the cervical spine was performed without intravenous contrast. Multiplanar CT image reconstructions were also generated.  RADIATION DOSE REDUCTION: This exam was performed according to the departmental dose-optimization program which includes automated exposure control, adjustment of the mA and/or kV according to patient size and/or use of iterative reconstruction technique.  COMPARISON:  Head CT today.  Cervical spine CT  03/05/2023.  FINDINGS: Alignment: Straightening of cervical lordosis. Cervicothoracic junction alignment is within normal limits. Bilateral posterior element alignment is within normal limits.  Skull base and vertebrae: Visualized skull base is intact. No atlanto-occipital dissociation. C1 and C2 appear intact and aligned. No acute osseous abnormality identified.  Soft tissues and spinal canal: No prevertebral fluid or swelling. No visible canal hematoma. Partially retropharyngeal carotids. Calcified atherosclerosis. Mild pharynx motion artifact.  Disc levels: Stable cervical spine degeneration. Multilevel disc and endplate degeneration with some ligamentous hypertrophy. 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  Upper chest: Thoracic spine and chest CT today reported separately.  IMPRESSION: 1. No acute traumatic injury identified in the cervical spine. 2. 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  Electronically Signed   By: Odessa Fleming M.D.   On: 04/14/2023 06:59 CT HEAD WO CONTRAST ( ) CLINICAL DATA:  77 year old female fell, found down. On blood thinners. Head hematoma.  EXAM: CT HEAD WITHOUT CONTRAST  TECHNIQUE: Contiguous axial images were obtained from the  base of the skull through the vertex without intravenous contrast.  RADIATION DOSE REDUCTION: This exam was performed according to the departmental dose-optimization program which includes automated exposure control, adjustment of the mA and/or kV according to patient size and/or use of iterative reconstruction technique.  COMPARISON:  Head CT 03/05/2023.  FINDINGS: Brain: Cerebral volume is stable, within normal limits for age. No midline shift, ventriculomegaly, mass effect, evidence of mass lesion, intracranial hemorrhage or evidence of cortically based acute infarction. Stable mild for age patchy white matter hypodensity. Otherwise normal  gray-white differentiation.  Vascular: Calcified atherosclerosis at the skull base. No suspicious intracranial vascular hyperdensity.  Skull: Stable and intact.  Sinuses/Orbits: Visualized paranasal sinuses and mastoids are stable and well aerated.  Other: There is a mild right posterior convexity scalp hematoma or contusion on series 3, image 35. Underlying calvarium appears intact. No other acute orbit or scalp soft tissue injury identified.  IMPRESSION: 1. Right posterior convexity scalp hematoma or contusion. No skull fracture. 2. No acute intracranial abnormality. Stable and largely negative for age non contrast CT appearance of the brain.  Electronically Signed   By: Odessa Fleming M.D.   On: 04/14/2023 06:55  Lab Results  Component Value Date   WBC 12.3 (H) 04/14/2023   HGB 12.8 04/14/2023   HCT 38.7 04/14/2023   MCV 92.8 04/14/2023   PLT 156 04/14/2023   Last metabolic panel Lab Results  Component Value Date   GLUCOSE 199 (H) 04/14/2023   NA 132 (L) 04/14/2023   K 4.4 04/14/2023   CL 95 (L) 04/14/2023   CO2 26 04/14/2023   BUN 34 (H) 04/14/2023   CREATININE 1.02 (H) 04/14/2023   GFRNONAA 56 (L) 04/14/2023   CALCIUM 8.8 (L) 04/14/2023   PROT 7.1 04/14/2023   ALBUMIN 3.4 (L) 04/14/2023   BILITOT 0.7 04/14/2023   ALKPHOS 37 (L) 04/14/2023   AST 29 04/14/2023   ALT 14 04/14/2023   ANIONGAP 11 04/14/2023    Assessment and Plan: * Fall Mechanical fall today slipping on spilled pineapple juice Noted multiple prior recurrent falls at home with noted moderate scalp hematoma as well as lumbar and sacral fractures on imaging today Fall precautions PT OT evaluation May benefit from rehab versus placement as patient lives at home alone Monitor  Type II diabetes mellitus with renal manifestations (HCC) Blood sugar in 190s SSI   Pleural effusion on right S/p thoracentesis 04/09/23 Pleural fluid analysis (+)leukocytosis monocyte predominant, and significant  elevation in LDH. DDx infectious etiologies, malignancy Following by Dr. Cathie Hoops w/ oncology w/ concern for lymphoma  Stable from a resp standpoint  Monitor   Hypothyroidism Continue Synthroid  COPD (chronic obstructive pulmonary disease) (HCC) Stable from respiratory standpoint Continue home inhaler  Atrial fibrillation, chronic (HCC) Chronic atrial fibrillation on amiodarone, metoprolol and Eliquis Noted recurrent falls at home with secondary scalp hematoma as well as lumbar sacral compression fractures Continue amiodarone and metoprolol for now Cont eliquis for now  May benefit from formal cardiology consultation to discuss anticoagulation prospectively given worsening fall risk Suspect benefits may possibly outweigh risk given hx/o atrial fibrillation, DVT and concern for malignancy w/ lymphoma    DVT (deep venous thrombosis) (HCC) Hx/o DVT  Previously on eliquis  Cont eliquis May need discussion about long term anticoagulation given recurrent falls    Lumbar compression fracture (HCC) Positive fall with noted L4 compression fracture as well as S5 fracture In discussion with Dr. Vicente Males, case discussed with neurosurgery who will evaluate Pain control PT  OT evaluation Monitor     Greater than 50% was spent in counseling and coordination of care with patient Total encounter time 80 minutes or more   Advance Care Planning:   Code Status: Full Code   Consults: Cardiology   Family Communication: Discussed plan of care w/ son over the phone   Severity of Illness: The appropriate patient status for this patient is INPATIENT. Inpatient status is judged to be reasonable and necessary in order to provide the required intensity of service to ensure the patient's safety. The patient's presenting symptoms, physical exam findings, and initial radiographic and laboratory data in the context of their chronic comorbidities is felt to place them at high risk for further clinical  deterioration. Furthermore, it is not anticipated that the patient will be medically stable for discharge from the hospital within 2 midnights of admission.   * I certify that at the point of admission it is my clinical judgment that the patient will require inpatient hospital care spanning beyond 2 midnights from the point of admission due to high intensity of service, high risk for further deterioration and high frequency of surveillance required.*  Author: Floydene Flock, MD 04/14/2023 1:32 PM  For on call review [email or web address].

## 2023-04-14 NOTE — Plan of Care (Signed)
  Problem: Acute Rehab PT Goals(only PT should resolve) Goal: Pt Will Go Supine/Side To Sit Outcome: Progressing Flowsheets (Taken 04/14/2023 1537) Pt will go Supine/Side to Sit: with contact guard assist Note: Using log roll technique to decrease LBP Goal: Pt Will Go Sit To Supine/Side Outcome: Progressing Flowsheets (Taken 04/14/2023 1537) Pt will go Sit to Supine/Side: with contact guard assist Note: Using log roll technique to decrease LBP

## 2023-04-14 NOTE — ED Notes (Signed)
Dr. Vicente Males to bedside, removed pt's c-collar.

## 2023-04-14 NOTE — ED Triage Notes (Signed)
Patient brought in by [facility] tonight from home after a fall today. Patient slipped on spilt pineapple juice in her floor and has been unable to get up since 3am until she could finally reach her phone to call 911. Has large knot on her head, is on blood thinners. Complaints of severe back and pelvic pain, she is extremely sensitive to palpation.

## 2023-04-14 NOTE — Plan of Care (Signed)
  Problem: Education: Goal: Knowledge of General Education information will improve Description: Including pain rating scale, medication(s)/side effects and non-pharmacologic comfort measures Outcome: Progressing   Problem: Pain Management: Goal: General experience of comfort will improve Outcome: Progressing   Problem: Safety: Goal: Ability to remain free from injury will improve Outcome: Progressing   Problem: Skin Integrity: Goal: Risk for impaired skin integrity will decrease Outcome: Progressing

## 2023-04-14 NOTE — Plan of Care (Signed)

## 2023-04-14 NOTE — Assessment & Plan Note (Signed)
Stable from respiratory standpoint Continue home inhaler

## 2023-04-14 NOTE — Assessment & Plan Note (Signed)
Sliding scale Novolog Titrate regimen

## 2023-04-14 NOTE — ED Provider Notes (Signed)
[facility] Provider Note   Event Date/Time   First MD Initiated Contact with Patient 04/14/23 [phone]     (approximate) History  Fall and Back Pain  HPI [name] is a 77 y.o. female who presents after mechanical fall yesterday and stay on the ground for approximately 3 hours.  Patient is now complaining of lumbar back and tailbone pain.  Patient states that she is very uncomfortable sitting down or with any movement.  Patient states she usually uses a cane or a walker and believes that she was however patient's family ember at bedside states that the walker was in a different room than where she fell.  ROS: Patient currently denies any vision changes, tinnitus, difficulty speaking, facial droop, sore throat, chest pain, shortness of breath, abdominal pain, nausea/vomiting/diarrhea, dysuria, bowel/bladder incontinence, saddle anesthesia, or weakness/numbness/paresthesias in any extremity   Physical Exam  Triage Vital Signs: ED Triage Vitals  Encounter Vitals Group     BP 04/14/23 0618 (!) 107/56     Systolic BP Percentile --      Diastolic BP Percentile --      Pulse Rate 04/14/23 0618 73     Resp 04/14/23 0618 18     Temp 04/14/23 0618 97.8 F (36.6 C)     Temp Source 04/14/23 0618 Oral     SpO2 04/14/23 0618 94 %     Weight 04/14/23 0619 139 lb 15.9 oz (63.5 kg)     Height 04/14/23 0619 5\' 2"  (1.575 m)     Head Circumference --      Peak Flow --      Pain Score 04/14/23 0618 10     Pain Loc --      Pain Education --      Exclude from Growth Chart --    Most recent vital signs: Vitals:   04/14/23 1100 04/14/23 1230  BP: (!) 96/58 (!) 106/50  Pulse: 61 62  Resp: 19   Temp: 97.9 F (36.6 C)   SpO2: 95% 97%   General: Awake, oriented x4. CV:  Good peripheral perfusion.  Resp:  Normal effort.  Abd:  No distention.  Other:  Elderly overweight Caucasian female resting comfortably in no acute distress ED Results / Procedures / Treatments   Labs (all labs ordered are listed, but only abnormal results are displayed) Labs Reviewed  CBC WITH DIFFERENTIAL/PLATELET - Abnormal; Notable for the following components:      Result Value   WBC 12.3 (*)    Neutro Abs 10.2 (*)    Abs Immature Granulocytes 0.10 (*)    All other components within normal limits  COMPREHENSIVE METABOLIC PANEL - Abnormal; Notable for the following components:   Sodium 132 (*)    Chloride 95 (*)    Glucose, Bld 199 (*)    BUN 34 (*)    Creatinine, Ser 1.02 (*)    Calcium 8.8 (*)    Albumin 3.4 (*)    Alkaline Phosphatase 37 (*)    GFR, Estimated 56 (*)    All other components within normal limits  CK   RADIOLOGY ED MD interpretation: CT of the thoracic spine, lumbar spine, chest/abdomen/pelvis, cervical spine, and head show evidence of osteopenia with mildly displaced acute fracture of the lower sacrum at S5 as well as a mild L4 superior endplate compression fracture -Agree with radiology assessment Official radiology report(s): CT L-SPINE NO CHARGE  Result Date: 04/14/2023 CLINICAL DATA:  77 year old female fell, found down. On blood  thinners. Head hematoma. EXAM: CT LUMBAR SPINE WITHOUT CONTRAST TECHNIQUE: Technique: Multiplanar CT images of the lumbar spine were reconstructed from contemporary CT of the Abdomen and Pelvis. RADIATION DOSE REDUCTION: This exam was performed according to the departmental dose-optimization program which includes automated exposure control, adjustment of the mA and/or kV according to patient size and/or use of iterative reconstruction technique. CONTRAST:  None COMPARISON:  CT thoracic, CT Chest, Abdomen, and Pelvis today reported separately. FINDINGS: Segmentation: Normal. Alignment: Mild dextroconvex upper and levoconvex lower lumbar scoliosis. Straightening of lordosis. No significant spondylolisthesis. Vertebrae: Diffuse osteopenia. L1, L2, and L3 vertebrae appear intact. Mild L4 superior endplate compression (sagittal  image 64, it is eccentric to the right (coronal image 48). L4 vertebra otherwise intact. L5 vertebra appears intact. Superior sacrum and SI joints appear intact. But there is a subtle, minimally displaced fracture of the lower sacrum in the midline S5 (sagittal image 62). Paraspinal and other soft tissues: Abdomen and pelvis are detailed separately. Lumbar paraspinal soft tissues are within normal limits. Disc levels: Lumbar spine degeneration with multilevel vacuum disc. Moderate to severe disc space loss with disc osteophyte disease L2-L3 through L3-L4. Estimated lumbar spinal stenosis by CT as follows L2-L3: Mild to moderate, and with lateral recess stenosis greater on the left. L3-L4:  Mild-to-moderate. L4-L5:  Moderate to severe (series 2, image 82). IMPRESSION: 1. Osteopenia. Positive for a minimally displaced acute fracture of the lower sacrum at S5. And a mild L4 superior endplate compression fracture is age indeterminate. 2. Advanced lumbar spine disc and endplate degeneration contributing to multilevel spinal stenosis which is moderate to severe at L4-L5 and mild to moderate at L2-L3 and L3-L4. 3. CT Abdomen and Pelvis today reported separately. Electronically Signed   By: Odessa Fleming M.D.   On: 04/14/2023 07:19   CT T-SPINE NO CHARGE  Result Date: 04/14/2023 CLINICAL DATA:  77 year old female fell, found down. On blood thinners. Head hematoma. EXAM: CT THORACIC SPINE WITHOUT CONTRAST TECHNIQUE: Multiplanar CT images of the thoracic spine were reconstructed from contemporary CT of the Chest. RADIATION DOSE REDUCTION: This exam was performed according to the departmental dose-optimization program which includes automated exposure control, adjustment of the mA and/or kV according to patient size and/or use of iterative reconstruction technique. CONTRAST:  None COMPARISON:  CT cervical spine, CT Chest, Abdomen, and Pelvis today reported separately. Recent chest CT 04/07/2023. FINDINGS: Limited cervical spine  imaging:  Reported separately. Thoracic spine segmentation: Appears normal; hypoplastic left 12th rib. Alignment: Stable thoracic kyphosis from earlier this month. No significant scoliosis or spondylolisthesis. Vertebrae: Chronic osteopenia. Subtle loss of height or compression at T5, T6, T11 is unchanged from earlier this month. No acute thoracic vertebral fracture identified. Visible posterior ribs appear intact. Paraspinal and other soft tissues: Chest and abdomen are detailed separately. Thoracic paraspinal soft tissues remain within normal limits. Disc levels: Thoracic spine degeneration although mostly anterior flowing endplate osteophytes. Partially calcified disc and/or endplate spurring at T10-T11 with evidence of mild chronic thoracic spinal stenosis there. Superimposed facet hypertrophy. Spinal stenosis could be mild or moderate. IMPRESSION: 1. Stable CT appearance of the Thoracic Spine. Osteopenia, No acute traumatic injury identified. 2. 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 3. CT Chest, Abdomen, and Pelvis today reported separately. Electronically Signed   By: Odessa Fleming M.D.   On: 04/14/2023 07:14   CT CHEST ABDOMEN PELVIS WO CONTRAST  Result Date: 04/14/2023 CLINICAL DATA:  77 year old female fell, found down. On blood thinners. Hematoma. EXAM: CT CHEST, ABDOMEN AND  PELVIS WITHOUT CONTRAST TECHNIQUE: Multidetector CT imaging of the chest, abdomen and pelvis was performed following the standard protocol without IV contrast. RADIATION DOSE REDUCTION: This exam was performed according to the departmental dose-optimization program which includes automated exposure control, adjustment of the mA and/or kV according to patient size and/or use of iterative reconstruction technique. COMPARISON:  Thoracic and lumbar CT today reported separately. Recent chest CT 04/07/2023. FINDINGS: CT CHEST FINDINGS Cardiovascular: Calcified aortic atherosclerosis. Calcified coronary artery  atherosclerosis. Stable noncontrast appearance of the heart and aorta. No cardiomegaly or pericardial effusion. Mediastinum/Nodes: Stable noncontrast mediastinum. No evidence of mass, lymphadenopathy, mediastinal hematoma. Lungs/Pleura: Layering right pleural effusion with simple fluid density remains moderate to large, slightly decreased compared to 04/07/2023. No contralateral left pleural effusion. Major airways remain patent. Compressive right lung atelectasis. Right lower lobe ventilation has mildly improved. No pneumothorax. Musculoskeletal: Left shoulder arthroplasty. Chronic posttraumatic and/or degenerative changes at the right shoulder. Chronic left rib fractures. Thoracic spine is reported separately. No sternal fracture. CT ABDOMEN PELVIS FINDINGS Hepatobiliary: Nodular liver contour suggestive of cirrhosis (series 2, image 69). No discrete liver lesion in the absence of IV contrast. Cholecystectomy. Pancreas: Negative. Spleen: Negative, no splenomegaly.  No perisplenic fluid. Adrenals/Urinary Tract: Negative noncontrast adrenal glands, kidneys, bladder. Pelvic phleboliths. Stomach/Bowel: Postoperative changes to the right ventral abdominal wall, probably chronic cholecystectomy related. Small fat containing umbilical hernia series 2, image 83. Nearby ventral abdominal wall subcutaneous fat stranding which is probably subcutaneous injection sites (series 2, image 93). No herniated bowel. No dilated large or small bowel. Severe diverticulosis of the sigmoid colon in the pelvis. No active large bowel inflammation. Appendicoliths on series 2, image 93, but otherwise normal appendix (coronal image 44). Decompressed terminal ileum. Decompressed stomach. No free air or free fluid. No mesenteric inflammation identified. Vascular/Lymphatic: Extensive Aortoiliac calcified atherosclerosis. Vascular patency is not evaluated in the absence of IV contrast. Normal caliber abdominal aorta. No lymphadenopathy  identified. Reproductive: Retroverted, otherwise negative noncontrast appearance. Other: No pelvis free fluid. Musculoskeletal: Lumbar spine is detailed separately. Sacrum, SI joints, pelvis, and proximal femurs appear intact. No superficial soft tissue injury identified. IMPRESSION: 1. No acute traumatic injury identified in the noncontrast chest, abdomen, or pelvis. See CT thoracic and lumbar spine reported separately. 2. Moderate to large layering right pleural effusion has slightly decreased compared to 04/07/2023. Simple fluid density favors transudate (see #3). Compressive right lung atelectasis. 3. Nodular liver contour strongly suggests Cirrhosis. But no ascites, no splenomegaly. 4.  Aortic Atherosclerosis (ICD10-I70.0). Electronically Signed   By: Odessa Fleming M.D.   On: 04/14/2023 07:09   CT Cervical Spine Wo Contrast  Result Date: 04/14/2023 CLINICAL DATA:  77 year old female fell, found down. On blood thinners. Hematoma. EXAM: CT CERVICAL SPINE WITHOUT CONTRAST TECHNIQUE: Multidetector CT imaging of the cervical spine was performed without intravenous contrast. Multiplanar CT image reconstructions were also generated. RADIATION DOSE REDUCTION: This exam was performed according to the departmental dose-optimization program which includes automated exposure control, adjustment of the mA and/or kV according to patient size and/or use of iterative reconstruction technique. COMPARISON:  Head CT today.  Cervical spine CT 03/05/2023. FINDINGS: Alignment: Straightening of cervical lordosis. Cervicothoracic junction alignment is within normal limits. Bilateral posterior element alignment is within normal limits. Skull base and vertebrae: Visualized skull base is intact. No atlanto-occipital dissociation. C1 and C2 appear intact and aligned. No acute osseous abnormality identified. Soft tissues and spinal canal: No prevertebral fluid or swelling. No visible canal hematoma. Partially retropharyngeal carotids.  Calcified atherosclerosis. Mild  pharynx motion artifact. Disc levels: Stable cervical spine degeneration. Multilevel disc and endplate degeneration with some ligamentous hypertrophy. 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 Upper chest: Thoracic spine and chest CT today reported separately. IMPRESSION: 1. No acute traumatic injury identified in the cervical spine. 2. 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 Electronically Signed   By: Odessa Fleming M.D.   On: 04/14/2023 06:59   CT HEAD WO CONTRAST ( )  Result Date: 04/14/2023 CLINICAL DATA:  77 year old female fell, found down. On blood thinners. Head hematoma. EXAM: CT HEAD WITHOUT CONTRAST TECHNIQUE: Contiguous axial images were obtained from the base of the skull through the vertex without intravenous contrast. RADIATION DOSE REDUCTION: This exam was performed according to the departmental dose-optimization program which includes automated exposure control, adjustment of the mA and/or kV according to patient size and/or use of iterative reconstruction technique. COMPARISON:  Head CT 03/05/2023. FINDINGS: Brain: Cerebral volume is stable, within normal limits for age. No midline shift, ventriculomegaly, mass effect, evidence of mass lesion, intracranial hemorrhage or evidence of cortically based acute infarction. Stable mild for age patchy white matter hypodensity. Otherwise normal gray-white differentiation. Vascular: Calcified atherosclerosis at the skull base. No suspicious intracranial vascular hyperdensity. Skull: Stable and intact. Sinuses/Orbits: Visualized paranasal sinuses and mastoids are stable and well aerated. Other: There is a mild right posterior convexity scalp hematoma or contusion on series 3, image 35. Underlying calvarium appears intact. No other acute orbit or scalp soft tissue injury identified. IMPRESSION: 1. Right posterior convexity scalp hematoma or contusion. No skull fracture.  2. No acute intracranial abnormality. Stable and largely negative for age non contrast CT appearance of the brain. Electronically Signed   By: Odessa Fleming M.D.   On: 04/14/2023 06:55   PROCEDURES: Critical Care performed: No .1-3 Lead EKG Interpretation  Performed by: Merwyn Katos, MD Authorized by: Merwyn Katos, MD     Interpretation: normal     ECG rate:  71   ECG rate assessment: normal     Rhythm: sinus rhythm     Ectopy: none     Conduction: normal    MEDICATIONS ORDERED IN ED: Medications  0.9 %  sodium chloride infusion ( Intravenous New Bag/Given 04/14/23 1438)  ondansetron (ZOFRAN) tablet 4 mg (has no administration in time range)    Or  ondansetron (ZOFRAN) injection 4 mg (has no administration in time range)  apixaban (ELIQUIS) tablet 5 mg (has no administration in time range)  sodium chloride 0.9 % bolus 1,000 mL (1,000 mLs Intravenous New Bag/Given 04/14/23 1035)  fentaNYL (SUBLIMAZE) injection 25 mcg (25 mcg Intravenous Given 04/14/23 1435)   IMPRESSION / MDM / ASSESSMENT AND PLAN / ED COURSE  I reviewed the triage vital signs and the nursing notes.                             The patient is on the cardiac monitor to evaluate for evidence of arrhythmia and/or significant heart rate changes. Patient's presentation is most consistent with acute presentation with potential threat to life or bodily function. The Pt was found to have a closed sacral fracture on XR. The Pt is otherwise well appearing, hemodynamically stable, and shows no evidence of neurovascular injury or compartment syndrome.  I have low suspicion for dislocation, significant ligamentous injury, septic arthritis, gout flare, new autoimmune arthropathy, or gonococcal arthropathy.  I spoke with Dr. Katrinka Blazing in neurosurgery who does not plan on  any intervention for this sacral fracture however does agree to evaluate patient.  Patient will require admission to the internal medicine service for further evaluation and  management given her limited mobility that will be even more limited given this sacral fracture.  I spoke to Dr. Alvester Morin and inpatient hospital medicine who has graciously agreed to accept this patient onto his service for further evaluation and management.  Dispo: Admit to medicine   FINAL CLINICAL IMPRESSION(S) / ED DIAGNOSES   Final diagnoses:  Closed fracture of sacrum, unspecified portion of sacrum, initial encounter (HCC)  Compression fracture of L4 vertebra, initial encounter (HCC)   Rx / DC Orders   ED Discharge Orders     None      Note:  This document was prepared using Dragon voice recognition software and may include unintentional dictation errors.   Merwyn Katos, MD 04/14/23 [phone]

## 2023-04-14 NOTE — ED Notes (Signed)
Fall Risk band and slip socks are present on pt.

## 2023-04-14 NOTE — ED Notes (Signed)
C Collar placed and then patient placed onto ER stretcher to obtain scans.

## 2023-04-14 NOTE — Progress Notes (Addendum)
Physical Therapy Evaluation Patient Details Name: [name] MRN: [id number] DOB: Dec 11, 1945 Today's Date: 04/14/2023  History of Present Illness  77 yo female brought to ED from home s/p fall on spilt juice with inability to get up until 3am when able to call 911 with severe pain to back and pelvis and R posterior head hematoma. CT findings: No acute traumatic injury to c spine, chest,  abdomen, or pelvis, minimally displaced acute fx of S5, mild L4 superior endplate compression fx is age indeterminate, multilevel spinal stenosis mod to severe at L4-L5 and mild to mod at L2-L3 and L3-L4, Chronic mild/mod degenerative spinal stenosis at T10-T11 and mod to large layering R PE has slightly decreased compared to 04/07/23.  Clinical Impression   Pt admitted with above diagnosis. Pt received supine in bed, with IV staff present and pt's son at bedside. Pt presenting following a fall at home (lives at independent living facility) where she slipped on spilled liquid from her refrigerator. Pt previously IND w/ all ADL's using an [facility] for ambulation inside and outside of the home prior to her previous admission. Pt lives alone in a one level home with no steps to enter or inside of the home. Pt's son and daughter in law, live close by and are available to assist pt PRN. Per pt son, he is planning to take FMLA to assist with patient at home in her previous environment.  Pt requiring MinA for supine to sit noting increased back pain 2/2 fx. Pt educated on log roll technique to mitigate pain. She required increased time, HOB elevated, mod multimodal cuing as well for successful supine to sit. Pt CGA at EOB for STS transfer w/ RW. Pt ambulated ~117ft CGA w/ RW and chair follow, however did not need to use chair. Pt requiring 2 standing rest breaks during ambulation. Pt on RA SPO2% 92-94 and HR 72-76 BPM. Pt returned to bedside with all needs met along with nursing staff and son present. Educated pt and son on  benefits of needed DME and donut for sitting due to sacral fracture for pain management. Pt currently with functional limitations due to the deficits listed below (see PT Problem List). Pt will benefit from skilled PT to increase their independence and safety with mobility to allow discharge to next venue of care.      If plan is discharge home, recommend the following: A little help with bathing/dressing/bathroom;Assistance with cooking/housework;A little help with walking and/or transfers;Assist for transportation   Can travel by private vehicle        Equipment Recommendations Hospital bed;Rolling walker (2 wheels)  Recommendations for Other Services       Functional Status Assessment Patient has had a recent decline in their functional status and demonstrates the ability to make significant improvements in function in a reasonable and predictable amount of time.     Precautions / Restrictions Precautions Precautions: Fall Restrictions Weight Bearing Restrictions: No      Mobility  Bed Mobility                    Transfers                        Ambulation/Gait                  Stairs            Wheelchair Mobility     Tilt Bed    Modified Rankin (Stroke Patients  Only)       Balance                                             Pertinent Vitals/Pain      Home Living Family/patient expects to be discharged to:: Private residence Living Arrangements: Alone Available Help at Discharge: Family;Available PRN/intermittently Type of Home: Apartment Home Access: Level entry       Home Layout: One level Home Equipment: Rollator (4 wheels);Cane - single point;Shower seat Additional Comments: oxygen at night up until recently where she has been wearing 24/7 at home    Prior Function Prior Level of Function : Independent/Modified Independent             Mobility Comments: participates with [facility] program2x/  week from 9am- 3pm. Mod I with cane for ambulation. walks a mile 3 days per week ADLs Comments: ADLs. one recent fall in the shower, along with 2 others from slipping on spilt vinegar and pineapple juice. Pt addressed fear of falling 2/2 to increase in recent falls. Pt's son anticipating taking FMLA to assist pt in the home with mobility.     Extremity/Trunk Assessment   Upper Extremity Assessment Upper Extremity Assessment: Generalized weakness    Lower Extremity Assessment Lower Extremity Assessment: Generalized weakness       Communication   Communication Communication: Hearing impairment (wears hearing aid) Cueing Techniques: Verbal cues  Cognition                                                General Comments General comments (skin integrity, edema, etc.): on RA sp02 remained 92-94%, pt continues to be limited by back pain when completing transfers into and out of bed.    Exercises     Assessment/Plan    PT Assessment Patient needs continued PT services  PT Problem List Decreased strength;Decreased range of motion;Decreased activity tolerance;Decreased balance;Decreased mobility;Cardiopulmonary status limiting activity;Pain       PT Treatment Interventions DME instruction;Gait training;Stair training;Functional mobility training;Therapeutic activities;Therapeutic exercise;Balance training;Neuromuscular re-education;Patient/family education;Cognitive remediation    PT Goals (Current goals can be found in the Care Plan section)  Acute Rehab PT Goals Patient Stated Goal: To return home safely PT Goal Formulation: With patient/family Time For Goal Achievement: 04/28/23 Potential to Achieve Goals: Fair    Frequency Min 1X/week     Co-evaluation               AM-PAC PT "6 Clicks" Mobility  Outcome Measure Help needed turning from your back to your side while in a flat bed without using bedrails?: A Lot Help needed moving from lying on  your back to sitting on the side of a flat bed without using bedrails?: A Lot Help needed moving to and from a bed to a chair (including a wheelchair)?: A Little Help needed standing up from a chair using your arms (e.g., wheelchair or bedside chair)?: A Little Help needed to walk in hospital room?: A Little Help needed climbing 3-5 steps with a railing? : A Little 6 Click Score: 16    End of Session Equipment Utilized During Treatment: Gait belt Activity Tolerance: Patient tolerated treatment well Patient left: in chair Nurse Communication: Mobility status PT Visit Diagnosis: Muscle weakness (  generalized) (M62.81);Unsteadiness on feet (R26.81)    Time: 1610-9604 PT Time Calculation (min) (ACUTE ONLY): 37 min   Charges:   PT Evaluation $PT Eval Moderate Complexity: 1 Mod PT Treatments $Therapeutic Activity: 8-22 mins PT General Charges $$ ACUTE PT VISIT: 1 Visit       Lovie Macadamia, SPT  Delphia Grates. Fairly IV, PT, DPT Physical Therapist- [place]  [facility]  04/14/2023, 3:45 PM

## 2023-04-14 NOTE — Assessment & Plan Note (Addendum)
Hx/o DVT  Previously on eliquis  Cont eliquis May need discussion about long term anticoagulation given recurrent falls

## 2023-04-14 NOTE — Assessment & Plan Note (Signed)
Positive fall with noted L4 compression fracture as well as S5 fracture In discussion with Dr. Vicente Males, case discussed with neurosurgery who will evaluate Pain control PT OT evaluation Monitor

## 2023-04-14 NOTE — TOC Initial Note (Signed)
Transition of Care [facility]) - Initial/Assessment Note    Patient Details  Name: [name] MRN: [id number] Date of Birth: Sep 13, 1945  Transition of [facility]) CM/SW Contact:    Darolyn Rua, LCSW Phone Number: 04/14/2023, 3:42 PM  Clinical Narrative:                  Patient recently admitted to [facility] and discharged 2 days ago.   Per last admission, patient is an active [facility] participant and will follow up with them at Discharge.   Patient has a strong support system through her family. [facility] to provide all post discharge needs for patient and plans to transport patient home at discharge. Last admission [facility] was to arrange for home health services for patient.    CSW spoke to [place] with [facility] at [phone] she reports patient can receive PT services through their outpatient program. Patient typically attends their adult day program M-F, they are not available on weekends. She reports patient's son and daughter in law do not live with her but can be available as needed to provide help on weekends.  Should patient discharge on the weekend, weekend staff can call : [phone]     Expected Discharge Plan: Home w Home Health Services Barriers to Discharge: Continued Medical Work up   Patient Goals and CMS Choice Patient states their goals for this hospitalization and ongoing recovery are:: to return home with pace CMS Medicare.gov Compare Post Acute Care list provided to:: Patient Choice offered to / list presented to : Patient      Expected Discharge Plan and Services       Living arrangements for the past 2 months: Apartment                                      Prior Living Arrangements/Services Living arrangements for the past 2 months: Apartment Lives with:: Self                   Activities of Daily Living      Permission Sought/Granted                  Emotional Assessment              Admission diagnosis:  Fall  [W19.XXXA] Patient Active Problem List   Diagnosis Date Noted   Fall 04/14/2023   Lumbar compression fracture (HCC) 04/14/2023   Unintentional weight loss 04/13/2023   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   Mood disorder (HCC) 04/13/2023   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) 04/12/2023   Pleural effusion on right 04/07/2023   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   Chronic kidney disease, stage 3a (HCC) 04/07/2023   Atrial fibrillation, chronic (HCC) 04/07/2023   DVT (deep venous thrombosis) (HCC) 04/07/2023   Leukocytosis 04/07/2023   Right foot pain 04/07/2023   Acute sinusitis 05/23/2022   COPD exacerbation (HCC) 05/22/2022   Atrial fibrillation with RVR (HCC) 05/22/2022   Chest pain 05/22/2022   HTN (hypertension) 05/22/2022   Diabetes mellitus without complication (HCC) 05/22/2022   COPD (chronic obstructive pulmonary disease) (HCC) 05/22/2022   Hypothyroidism 05/22/2022   Iron deficiency anemia 05/22/2022   Osteoarthritis of right knee 03/14/2022   PCP:  [facility] Pharmacy:   [facility] - [place], [place] -  [address] [place] [address] Phone: [phone] Fax: [phone]     Social Determinants of Health (SDOH) Social History: SDOH Screenings   Food Insecurity: No Food Insecurity (04/13/2023)  Housing: Low Risk  (04/13/2023)  Transportation Needs: No Transportation Needs (04/08/2023)  Utilities: Not At Risk (04/08/2023)  Depression (PHQ2-9): Low Risk  (04/13/2023)  Tobacco Use: Low Risk  (04/13/2023)   SDOH Interventions:     Readmission Risk Interventions    04/10/2023    1:51 PM  Readmission Risk Prevention Plan  Transportation Screening Complete  PCP or Specialist Appt within 3-5 Days Complete  Palliative Care Screening Complete  Medication Review (RN Care Manager)  Complete

## 2023-04-14 NOTE — Assessment & Plan Note (Addendum)
Chronic atrial fibrillation on amiodarone, metoprolol and Eliquis Noted recurrent falls at home with secondary scalp hematoma as well as lumbar sacral compression fractures Continue amiodarone and metoprolol for now Cont eliquis for now  May benefit from formal cardiology consultation to discuss anticoagulation prospectively given worsening fall risk Suspect benefits may possibly outweigh risk given hx/o atrial fibrillation, DVT and concern for malignancy w/ lymphoma

## 2023-04-15 ENCOUNTER — Encounter: Payer: Self-pay | Admitting: Family Medicine

## 2023-04-15 DIAGNOSIS — W19XXXA Unspecified fall, initial encounter: Secondary | ICD-10-CM | POA: Diagnosis not present

## 2023-04-15 DIAGNOSIS — S32040A Wedge compression fracture of fourth lumbar vertebra, initial encounter for closed fracture: Secondary | ICD-10-CM | POA: Diagnosis not present

## 2023-04-15 DIAGNOSIS — S3210XA Unspecified fracture of sacrum, initial encounter for closed fracture: Secondary | ICD-10-CM | POA: Diagnosis not present

## 2023-04-15 DIAGNOSIS — I482 Chronic atrial fibrillation, unspecified: Secondary | ICD-10-CM | POA: Diagnosis not present

## 2023-04-15 LAB — CBC
HCT: 32.1 % — ABNORMAL LOW (ref 36.0–46.0)
Hemoglobin: 10.8 g/dL — ABNORMAL LOW (ref 12.0–15.0)
MCH: 30.4 pg (ref 26.0–34.0)
MCHC: 33.6 g/dL (ref 30.0–36.0)
MCV: 90.4 fL (ref 80.0–100.0)
Platelets: 139 10*3/uL — ABNORMAL LOW (ref 150–400)
RBC: 3.55 MIL/uL — ABNORMAL LOW (ref 3.87–5.11)
RDW: 13.2 % (ref 11.5–15.5)
WBC: 7.6 10*3/uL (ref 4.0–10.5)
nRBC: 0 % (ref 0.0–0.2)

## 2023-04-15 LAB — COMPREHENSIVE METABOLIC PANEL
ALT: 14 U/L (ref 0–44)
AST: 13 U/L — ABNORMAL LOW (ref 15–41)
Albumin: 2.8 g/dL — ABNORMAL LOW (ref 3.5–5.0)
Alkaline Phosphatase: 30 U/L — ABNORMAL LOW (ref 38–126)
Anion gap: 9 (ref 5–15)
BUN: 19 mg/dL (ref 8–23)
CO2: 24 mmol/L (ref 22–32)
Calcium: 8.2 mg/dL — ABNORMAL LOW (ref 8.9–10.3)
Chloride: 103 mmol/L (ref 98–111)
Creatinine, Ser: 0.75 mg/dL (ref 0.44–1.00)
GFR, Estimated: >60 mL/min (ref >60)
Glucose, Bld: 109 mg/dL — ABNORMAL HIGH (ref 70–99)
Potassium: 3.8 mmol/L (ref 3.5–5.1)
Sodium: 136 mmol/L (ref 135–145)
Total Bilirubin: 0.3 mg/dL (ref <1.2)
Total Protein: 5.5 g/dL — ABNORMAL LOW (ref 6.5–8.1)

## 2023-04-15 MED ORDER — MOMETASONE FURO-FORMOTEROL FUM 200-5 MCG/ACT IN AERO
2.0000 | INHALATION_SPRAY | Freq: Two times a day (BID) | RESPIRATORY_TRACT | Status: DC
  Administered 2023-04-15 – 2023-04-16 (×4): 2 via RESPIRATORY_TRACT
  Filled 2023-04-15: qty 8.8

## 2023-04-15 MED ORDER — ACETAMINOPHEN 500 MG PO TABS
1000.0000 mg | ORAL_TABLET | Freq: Once | ORAL | Status: AC
  Administered 2023-04-15: 1000 mg via ORAL
  Filled 2023-04-15: qty 2

## 2023-04-15 MED ORDER — SALINE SPRAY 0.65 % NA SOLN: 1.0000 | NASAL | Status: DC | PRN

## 2023-04-15 MED ORDER — FLUTICASONE PROPIONATE 50 MCG/ACT NA SUSP: 2.0000 | Freq: Every day | NASAL | Status: DC | PRN

## 2023-04-15 MED ORDER — ROPINIROLE HCL 0.25 MG PO TABS
0.2500 mg | ORAL_TABLET | Freq: Every day | ORAL | Status: DC
  Administered 2023-04-15 – 2023-04-16 (×2): 0.25 mg via ORAL
  Filled 2023-04-15 (×2): qty 1

## 2023-04-15 MED ORDER — LORATADINE 10 MG PO TABS: 10.0000 mg | ORAL_TABLET | Freq: Every day | ORAL | Status: DC | PRN

## 2023-04-15 MED ORDER — IPRATROPIUM-ALBUTEROL 0.5-2.5 (3) MG/3ML IN SOLN: 3.0000 mL | RESPIRATORY_TRACT | Status: DC | PRN

## 2023-04-15 MED ORDER — POLYETHYLENE GLYCOL 3350 17 G PO PACK
17.0000 g | PACK | Freq: Every day | ORAL | Status: DC
  Administered 2023-04-15 – 2023-04-17 (×3): 17 g via ORAL
  Filled 2023-04-15 (×3): qty 1

## 2023-04-15 MED ORDER — LEVOTHYROXINE SODIUM 50 MCG PO TABS
125.0000 ug | ORAL_TABLET | Freq: Every day | ORAL | Status: DC
  Administered 2023-04-15 – 2023-04-17 (×3): 125 ug via ORAL
  Filled 2023-04-15 (×6): qty 1

## 2023-04-15 MED ORDER — FENTANYL CITRATE PF 50 MCG/ML IJ SOSY
12.5000 ug | PREFILLED_SYRINGE | INTRAMUSCULAR | Status: DC | PRN
Start: 2023-04-15 — End: 2023-04-17
  Administered 2023-04-15: 25 ug via INTRAVENOUS
  Filled 2023-04-15: qty 1

## 2023-04-15 MED ORDER — GUAIFENESIN ER 600 MG PO TB12: 600.0000 mg | ORAL_TABLET | Freq: Two times a day (BID) | ORAL | Status: DC | PRN

## 2023-04-15 MED ORDER — PANTOPRAZOLE SODIUM 40 MG PO TBEC
40.0000 mg | DELAYED_RELEASE_TABLET | Freq: Every day | ORAL | Status: DC
  Administered 2023-04-15 – 2023-04-17 (×3): 40 mg via ORAL
  Filled 2023-04-15 (×3): qty 1

## 2023-04-15 MED ORDER — TRAZODONE HCL 50 MG PO TABS: 50.0000 mg | ORAL_TABLET | Freq: Every evening | ORAL | Status: DC | PRN

## 2023-04-15 MED ORDER — AMIODARONE HCL 200 MG PO TABS
100.0000 mg | ORAL_TABLET | Freq: Every day | ORAL | Status: DC
  Administered 2023-04-15 – 2023-04-17 (×3): 100 mg via ORAL
  Filled 2023-04-15 (×3): qty 1

## 2023-04-15 MED ORDER — POLYVINYL ALCOHOL 1.4 % OP SOLN
2.0000 [drp] | Freq: Four times a day (QID) | OPHTHALMIC | Status: DC
  Administered 2023-04-15 – 2023-04-17 (×6): 2 [drp] via OPHTHALMIC
  Filled 2023-04-15 (×2): qty 15

## 2023-04-15 MED ORDER — ACETAMINOPHEN 500 MG PO TABS
1000.0000 mg | ORAL_TABLET | Freq: Three times a day (TID) | ORAL | Status: DC
  Administered 2023-04-15 – 2023-04-17 (×5): 1000 mg via ORAL
  Filled 2023-04-15 (×5): qty 2

## 2023-04-15 MED ORDER — SENNOSIDES-DOCUSATE SODIUM 8.6-50 MG PO TABS
1.0000 | ORAL_TABLET | Freq: Two times a day (BID) | ORAL | Status: DC
  Administered 2023-04-15 – 2023-04-16 (×3): 1 via ORAL
  Filled 2023-04-15 (×3): qty 1

## 2023-04-15 MED ORDER — BISACODYL 10 MG RE SUPP: 10.0000 mg | Freq: Every day | RECTAL | Status: DC | PRN

## 2023-04-15 MED ORDER — ENSURE ENLIVE PO LIQD
237.0000 mL | Freq: Two times a day (BID) | ORAL | Status: DC
  Administered 2023-04-16: 237 mL via ORAL

## 2023-04-15 NOTE — Progress Notes (Signed)
Physical Therapy Treatment Patient Details Name: [name] MRN: [id number] DOB: Oct 06, 1945 Today's Date: 04/15/2023   History of Present Illness 77 yo female brought to ED from home s/p fall on spilt juice with inability to get up until 3am when able to call 911 with severe pain to back and pelvis and R posterior head hematoma. CT findings: No acute traumatic injury to c spine, chest,  abdomen, or pelvis, minimally displaced acute fx of S5, mild L4 superior endplate compression fx is age indeterminate, multilevel spinal stenosis mod to severe at L4-L5 and mild to mod at L2-L3 and L3-L4, Chronic mild/mod degenerative spinal stenosis at T10-T11 and mod to large layering R PE has slightly decreased compared to 04/07/23.    PT Comments  Pt was long sitting in bed upon arrival. She is A and O x 3. Agrees to session and is eager to get OOB to ambulate. She tolerated ambulation 3 laps around RN station with use of RW. Pt does require min assist to safely exit and return to long sitting in bed. Acute PT will continue to follow and progress pt to maximal independence with all ADLs. TLSO ordered for comfort. [facility] was contacted.    If plan is discharge home, recommend the following: A little help with bathing/dressing/bathroom;Assistance with cooking/housework;A little help with walking and/or transfers;Assist for transportation     Equipment Recommendations  Hospital bed;Rolling walker (2 wheels)       Precautions / Restrictions Precautions Precautions: Fall Restrictions Weight Bearing Restrictions: No     Mobility  Bed Mobility Overal bed mobility: Needs Assistance Bed Mobility: Supine to Sit, Sit to Supine  Supine to sit: Min assist Sit to supine: Min assist   Transfers Overall transfer level: Needs assistance Equipment used: Rolling walker (2 wheels) Transfers: Sit to/from Stand Sit to Stand: Contact guard assist   Ambulation/Gait Ambulation/Gait assistance:  Supervision Gait Distance (Feet): 600 Feet Assistive device: Rolling walker (2 wheels) Gait Pattern/deviations: Step-through pattern Gait velocity: decreased  General Gait Details: pt ambulated and tolerated gait around RN station 3 laps    Balance Overall balance assessment: Needs assistance, History of Falls Sitting-balance support: Feet supported, No upper extremity supported Sitting balance-Leahy Scale: Good     Standing balance support: Bilateral upper extremity supported, During functional activity Standing balance-Leahy Scale: Good         Cognition Arousal: Alert Behavior During Therapy: WFL for tasks assessed/performed Overall Cognitive Status: Within Functional Limits for tasks assessed              Pertinent Vitals/Pain Pain Assessment Pain Assessment: 0-10 Pain Score: 6  Pain Location: back/tailbone Pain Descriptors / Indicators: Aching, Sore, Grimacing Pain Intervention(s): Limited activity within patient's tolerance, Monitored during session, Premedicated before session, Repositioned     PT Goals (current goals can now be found in the care plan section) Acute Rehab PT Goals Patient Stated Goal: To return home safely Progress towards PT goals: Progressing toward goals    Frequency    Min 1X/week       AM-PAC PT "6 Clicks" Mobility   Outcome Measure  Help needed turning from your back to your side while in a flat bed without using bedrails?: A Little Help needed moving from lying on your back to sitting on the side of a flat bed without using bedrails?: A Little Help needed moving to and from a bed to a chair (including a wheelchair)?: A Little Help needed standing up from a chair using your arms (e.g.,  wheelchair or bedside chair)?: A Little Help needed to walk in hospital room?: A Little Help needed climbing 3-5 steps with a railing? : A Little 6 Click Score: 18    End of Session   Activity Tolerance: Patient tolerated treatment  well Patient left: in bed;with call bell/phone within reach;with bed alarm set Nurse Communication: Mobility status PT Visit Diagnosis: Muscle weakness (generalized) (M62.81);Unsteadiness on feet (R26.81)     Time: 4403-4742 PT Time Calculation (min) (ACUTE ONLY): 23 min  Charges:    $Gait Training: 23-37 mins PT General Charges $$ ACUTE PT VISIT: 1 Visit                     Jetta Lout PTA 04/15/23, 4:30 PM

## 2023-04-15 NOTE — Progress Notes (Signed)
Orthopedic Tech Progress Note Patient Details:  [name] 1945/12/10 [id number]  Routine order for a TLSO called into [facility]'s answering service.  Patient ID: [name], female   DOB: [birth year], 77 y.o.   MRN: [id number]  Docia Furl 04/15/2023, 12:53 PM

## 2023-04-15 NOTE — Plan of Care (Signed)
  Problem: Clinical Measurements: Goal: Respiratory complications will improve Outcome: Progressing   Problem: Activity: Goal: Risk for activity intolerance will decrease Outcome: Progressing   Problem: Nutrition: Goal: Adequate nutrition will be maintained Outcome: Progressing   Problem: Elimination: Goal: Will not experience complications related to urinary retention Outcome: Progressing   Problem: Pain Management: Goal: General experience of comfort will improve Outcome: Progressing   Problem: Skin Integrity: Goal: Risk for impaired skin integrity will decrease Outcome: Progressing

## 2023-04-15 NOTE — Assessment & Plan Note (Signed)
BP's are soft. Holding home enalapril, Lasix, metoprolol. Resume meds gradually as BP allows.

## 2023-04-15 NOTE — Plan of Care (Signed)
  Problem: Education: Goal: Knowledge of General Education information will improve Description: Including pain rating scale, medication(s)/side effects and non-pharmacologic comfort measures Outcome: Progressing   Problem: Pain Management: Goal: General experience of comfort will improve Outcome: Progressing   Problem: Safety: Goal: Ability to remain free from injury will improve Outcome: Progressing   Problem: Skin Integrity: Goal: Risk for impaired skin integrity will decrease Outcome: Progressing

## 2023-04-15 NOTE — Assessment & Plan Note (Signed)
Stable.  Monitor BMP. °

## 2023-04-15 NOTE — Progress Notes (Addendum)
Progress Note   Patient: [name] NWG:[id number] DOB: 28-Aug-1945 DOA: 04/14/2023     1 DOS: the patient was seen and examined on 04/15/2023    Brief hospital course:  HPI on admission 04/14/23:  "[name] is a 77 y.o. female with medical history significant of a fib and DVT on Eliquis, HTN, diet-controlled diabetes, COPD on 2 L oxygen at night, hypothyroidism, GERD, depression, anemia, CKD-3A presenting with recurrent falls, lumbosacral fracture.  Patient currently lives at home alone.  Noted to have been recently admitted November 1 through November 6 for right-sided pleural effusion with concern for malignancy.  Per the patient, had a can of pineapples when some of the juice got on the floor patient slipped and fell landing on her back and head.  Positive head trauma.  Patient did reports multiple recent falls over multiple months.  Positive generalized weakness.  ..." See H&P for full HPI on admission and ED course.   Pt was found to have an L4 compression fracture and possible S5 sacrococcygeal fracture on CT imaging.  She was  admitted for further evaluation and management.  Neurosurgery was consulted and recommend conservative management with TLSO brace for L4 compression fracture for comfort.  Sacral fracture non-surgical.    Assessment and Plan: * Fall Mechanical fall, pt slipped on spilled pineapple juice on the floor. Multiple prior recurrent falls at home with noted moderate scalp hematoma as well as lumbar and sacral fractures on imaging this admission. --Fall precautions --PT OT evaluation --Anticipate need for SNF  Lumbar compression fracture (HCC) L4 compression fracture  S5 fracture Both injuries being managed conservatively, non-operative. --Neurosurgery consulted --TLSO brace for comfort --Pain control & bowel regimen --PT OT evaluations --Padded seating --Possible injections in future if ongoing sacrococcygeal pain --Follow up with Neurosurgery  outpatient for upright x-rays  Sacral fracture, closed (HCC) As above  Type II diabetes mellitus with renal manifestations (HCC) Sliding scale Novolog Titrate regimen   HTN (hypertension) BP's are soft. Holding home enalapril, Lasix, metoprolol. Resume meds gradually as BP allows.  Chronic kidney disease, stage 3a (HCC) Stable. Monitor BMP.  Pleural effusion on right Respiratory status is stable. S/p thoracentesis 04/09/23 Pleural fluid analysis (+)leukocytosis monocyte predominant, and significant elevation in LDH. DDx infectious etiologies, malignancy --Following by Dr. Cathie Hoops w/ oncology w/ concern for lymphoma   Hypothyroidism Continue Synthroid  COPD (chronic obstructive pulmonary disease) (HCC) Stable. Continue home regimen  Atrial fibrillation, chronic (HCC) --Continue amiodarone and metoprolol --Cont Eliquis  --Cardiology consulted on admission to  to discuss risk vs benefits of anticoagulation given multiple falls. Currently feel that benefits may outweigh risk given hx/o atrial fibrillation, DVT and concern for malignancy w/ lymphoma.    DVT (deep venous thrombosis) (HCC) Hx/o DVT  On Eliquis Continue discussions about long term anticoagulation given recurrent falls          Subjective: Pt seen awake resting in bed with RN at bedside this AM.  Pt reports feeling okay.  No BM in several days at home.  Pain currently controlled.  No other acute complaints.  Physical Exam: Vitals:   04/14/23 1700 04/14/23 2326 04/14/23 2334 04/15/23 0750  BP: (!) 110/48 (!) 88/41 100/64 (!) 113/54  Pulse: 66 64  (!) 59  Resp: 16 20  16   Temp: 97.6 F (36.4 C) 98 F (36.7 C)  97.9 F (36.6 C)  TempSrc:      SpO2: 98% 94%  93%  Weight:      Height:  General exam: awake, alert, no acute distress HEENT: moist mucus membranes, hearing grossly normal  Respiratory system: CTAB but generally diminished, no expiratory wheezes, normal respiratory  effort. Cardiovascular system: normal S1/S2, RRR, no pedal edema.   Gastrointestinal system: soft, NT, ND Central nervous system: A&O x3. no gross focal neurologic deficits, normal speech Extremities: moves all, no edema, normal tone Skin: dry, intact, normal temperature Psychiatry: normal mood, congruent affect, judgement and insight appear normal   Data Reviewed:  Notable labs ---  Glucose 109, Ca 8.2, alk phon 30, albumin 2.8, AST 13, Hbg 12.8 >> 10.8  Family Communication: None at bedside, will attempt to call this afternoon as time allows.   Disposition: Status is: Inpatient Remains inpatient appropriate because: Ongoing evaluation   Planned Discharge Destination: Skilled nursing facility    Time spent: 42 minutes  Author: Pennie Banter, DO 04/15/2023 12:16 PM  For on call review [email or web address].

## 2023-04-15 NOTE — Consult Note (Signed)
Consulting Department:  Emergency department   Primary Physician:  [facility]  Chief Complaint: Lumbar and sacral fractures after a fall with back pain History of Present Illness: 04/15/2023 [name] is a 77 y.o. female who presents with the chief complaint of recent fall.  Has a history of A-fib and DVT on Eliquis.  Hypertension.  Hypothyroidism, COPD, anemia,.  Patient lives at home.  She was admitted for a pleural effusion recently.  Slipped on a wet floor fell and landed on her back and head.  She does have a scalp hematoma.  Did not notice any weakness numbness or tingling after the fall.  No bowel or bladder incontinence.  No radiating pain down her legs.  She does have a history of a coccyx fracture.  Also has a history of significant right lower extremity pain that is been going on for multiple months  Review of Systems:  A 10 point review of systems is negative, except for the pertinent positives and negatives detailed in the HPI.  Past Medical History: Past Medical History:  Diagnosis Date   Anemia    Asthma    COPD (chronic obstructive pulmonary disease) (HCC)    Diabetes mellitus without complication (HCC)    type 2   DVT (deep venous thrombosis) (HCC) 1980   GERD (gastroesophageal reflux disease)    History of hiatal hernia    Hypertension    Hypothyroidism     Past Surgical History: Past Surgical History:  Procedure Laterality Date   CATARACT EXTRACTION Bilateral    CHOLECYSTECTOMY     COLONOSCOPY     ESOPHAGOGASTRODUODENOSCOPY (EGD) WITH PROPOFOL N/A 01/17/2022   Procedure: ESOPHAGOGASTRODUODENOSCOPY (EGD) WITH PROPOFOL;  Surgeon: Jaynie Collins, DO;  Location: [facility] ENDOSCOPY;  Service: Gastroenterology;  Laterality: N/A;   HUMERUS SURGERY Left    fracture (7 surgeries total)   MENISCUS REPAIR Right 2019   THYROIDECTOMY     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; reconstruction from hip bone   TONSILECTOMY, ADENOIDECTOMY, BILATERAL  MYRINGOTOMY AND TUBES     TOTAL KNEE ARTHROPLASTY Right 03/14/2022   Procedure: TOTAL KNEE ARTHROPLASTY;  Surgeon: Reinaldo Berber, MD;  Location: [facility] ORS;  Service: Orthopedics;  Laterality: Right;   TOTAL SHOULDER REPLACEMENT Left    TUBAL LIGATION     WRIST SURGERY Left    multiple fractures from falls (14 surgeries from wrist to shoulder)    Allergies: Allergies as of 04/14/2023 - Review Complete 04/14/2023  Allergen Reaction Noted   Cephalosporins Itching 03/02/2022   Codeine Other (See Comments) 03/02/2022   Contrast media [iodinated contrast media] Hives and Swelling 11/27/2020   Oxycodone Other (See Comments) 03/02/2022    Medications:  Current Facility-Administered Medications:    0.9 %  sodium chloride infusion, , Intravenous, Continuous, Floydene Flock, MD, Last Rate: 75 mL/hr at 04/15/23 0126, New Bag at 04/15/23 0126   apixaban (ELIQUIS) tablet 5 mg, 5 mg, Oral, BID, Floydene Flock, MD   ondansetron [facility]) tablet 4 mg, 4 mg, Oral, Q6H PRN **OR** ondansetron (ZOFRAN) injection 4 mg, 4 mg, Intravenous, Q6H PRN, Floydene Flock, MD   Social History: Social History   Tobacco Use   Smoking status: Never   Smokeless tobacco: Never  Vaping Use   Vaping status: Never Used  Substance Use Topics   Alcohol use: Never   Drug use: Never    Family Medical History: Family History  Problem Relation Age of Onset   Kidney disease Mother  Tuberculosis Father     Physical Examination: Vitals:   04/14/23 2326 04/14/23 2334  BP: (!) 88/41 100/64  Pulse: 64   Resp: 20   Temp: 98 F (36.7 C)   SpO2: 94%      General: Patient is well developed, well nourished, calm, collected, and in no apparent distress.  NEUROLOGICAL:  General: In no acute distress.   Awake, alert, oriented to person, place, and time.  Pupils equal round and reactive to light.  Facial tone is symmetric.  Tongue protrusion is midline.  There is no pronator drift.  Strength: Full  strength distally in the lower extremities, no weakness in plantarflexion dorsiflexion toe extension, or knee extension.  Bilateral upper and lower extremity sensation is intact to light touch. Brief Reflexes are 1+ throughout  Clonus is not present.  Toes are down-going.   Imaging: Narrative & Impression  CLINICAL DATA:  77 year old female fell, found down. On blood thinners. Head hematoma.   EXAM: CT LUMBAR SPINE WITHOUT CONTRAST   TECHNIQUE: Technique: Multiplanar CT images of the lumbar spine were reconstructed from contemporary CT of the Abdomen and Pelvis.   RADIATION DOSE REDUCTION: This exam was performed according to the departmental dose-optimization program which includes automated exposure control, adjustment of the mA and/or kV according to patient size and/or use of iterative reconstruction technique.   CONTRAST:  None   COMPARISON:  CT thoracic, CT Chest, Abdomen, and Pelvis today reported separately.   FINDINGS: Segmentation: Normal.   Alignment: Mild dextroconvex upper and levoconvex lower lumbar scoliosis. Straightening of lordosis. No significant spondylolisthesis.   Vertebrae: Diffuse osteopenia. L1, L2, and L3 vertebrae appear intact. Mild L4 superior endplate compression (sagittal image 64, it is eccentric to the right (coronal image 48). L4 vertebra otherwise intact.   L5 vertebra appears intact. Superior sacrum and SI joints appear intact. But there is a subtle, minimally displaced fracture of the lower sacrum in the midline S5 (sagittal image 62).   Paraspinal and other soft tissues: Abdomen and pelvis are detailed separately. Lumbar paraspinal soft tissues are within normal limits.   Disc levels: Lumbar spine degeneration with multilevel vacuum disc. Moderate to severe disc space loss with disc osteophyte disease L2-L3 through L3-L4. Estimated lumbar spinal stenosis by CT as follows   L2-L3: Mild to moderate, and with lateral recess  stenosis greater on the left.   L3-L4:  Mild-to-moderate.   L4-L5:  Moderate to severe (series 2, image 82).   IMPRESSION: 1. Osteopenia. Positive for a minimally displaced acute fracture of the lower sacrum at S5. And a mild L4 superior endplate compression fracture is age indeterminate. 2. Advanced lumbar spine disc and endplate degeneration contributing to multilevel spinal stenosis which is moderate to severe at L4-L5 and mild to moderate at L2-L3 and L3-L4. 3. CT Abdomen and Pelvis today reported separately.     Electronically Signed   By: Odessa Fleming M.D.   On: 04/14/2023 07:19      Narrative & Impression  CLINICAL DATA:  77 year old female fell, found down. On blood thinners. Hematoma.   EXAM: CT CHEST, ABDOMEN AND PELVIS WITHOUT CONTRAST   TECHNIQUE: Multidetector CT imaging of the chest, abdomen and pelvis was performed following the standard protocol without IV contrast.   RADIATION DOSE REDUCTION: This exam was performed according to the departmental dose-optimization program which includes automated exposure control, adjustment of the mA and/or kV according to patient size and/or use of iterative reconstruction technique.   COMPARISON:  Thoracic and lumbar CT  today reported separately. Recent chest CT 04/07/2023.   FINDINGS: CT CHEST FINDINGS   Cardiovascular: Calcified aortic atherosclerosis. Calcified coronary artery atherosclerosis. Stable noncontrast appearance of the heart and aorta. No cardiomegaly or pericardial effusion.   Mediastinum/Nodes: Stable noncontrast mediastinum. No evidence of mass, lymphadenopathy, mediastinal hematoma.   Lungs/Pleura: Layering right pleural effusion with simple fluid density remains moderate to large, slightly decreased compared to 04/07/2023.   No contralateral left pleural effusion. Major airways remain patent. Compressive right lung atelectasis. Right lower lobe ventilation has mildly improved. No  pneumothorax.   Musculoskeletal: Left shoulder arthroplasty. Chronic posttraumatic and/or degenerative changes at the right shoulder. Chronic left rib fractures. Thoracic spine is reported separately. No sternal fracture.   CT ABDOMEN PELVIS FINDINGS   Hepatobiliary: Nodular liver contour suggestive of cirrhosis (series 2, image 69). No discrete liver lesion in the absence of IV contrast. Cholecystectomy.   Pancreas: Negative.   Spleen: Negative, no splenomegaly.  No perisplenic fluid.   Adrenals/Urinary Tract: Negative noncontrast adrenal glands, kidneys, bladder. Pelvic phleboliths.   Stomach/Bowel: Postoperative changes to the right ventral abdominal wall, probably chronic cholecystectomy related. Small fat containing umbilical hernia series 2, image 83. Nearby ventral abdominal wall subcutaneous fat stranding which is probably subcutaneous injection sites (series 2, image 93). No herniated bowel. No dilated large or small bowel. Severe diverticulosis of the sigmoid colon in the pelvis. No active large bowel inflammation. Appendicoliths on series 2, image 93, but otherwise normal appendix (coronal image 44). Decompressed terminal ileum. Decompressed stomach. No free air or free fluid. No mesenteric inflammation identified.   Vascular/Lymphatic: Extensive Aortoiliac calcified atherosclerosis. Vascular patency is not evaluated in the absence of IV contrast. Normal caliber abdominal aorta. No lymphadenopathy identified.   Reproductive: Retroverted, otherwise negative noncontrast appearance.   Other: No pelvis free fluid.   Musculoskeletal: Lumbar spine is detailed separately. Sacrum, SI joints, pelvis, and proximal femurs appear intact. No superficial soft tissue injury identified.   IMPRESSION: 1. No acute traumatic injury identified in the noncontrast chest, abdomen, or pelvis. See CT thoracic and lumbar spine reported separately.   2. Moderate to large layering  right pleural effusion has slightly decreased compared to 04/07/2023. Simple fluid density favors transudate (see #3). Compressive right lung atelectasis.   3. Nodular liver contour strongly suggests Cirrhosis. But no ascites, no splenomegaly.   4.  Aortic Atherosclerosis (ICD10-I70.0).     Electronically Signed   By: Odessa Fleming M.D.   On: 04/14/2023 07:09       I have personally reviewed the images and agree with the above interpretation.  Labs:    Latest Ref Rng & Units 04/15/2023    4:15 AM 04/14/2023    6:27 AM 04/13/2023   12:13 PM  CBC  WBC 4.0 - 10.5 K/uL 7.6  12.3  10.3   Hemoglobin 12.0 - 15.0 g/dL 64.4  03.4  74.2   Hematocrit 36.0 - 46.0 % 32.1  38.7  39.6   Platelets 150 - 400 K/uL 139  156  181        Assessment and Plan: Ms. Casher is a pleasant 77 y.o. female with a recent fall.  She slipped on a wet floor and landed on her back and posterior scalp.  Was found to have lumbar compression fractures of unknown age.  Also questionable distal sacral fracture transverse not there are any neuroforamina.  On further history, she states that she did have a previous coccyx fracture from a prior fall.  On physical examination she  has no clear deficits.  No new neurodeficits or neurologic complaints/symptoms on history taking.  On review of her imaging she has a small superior endplate fracture noted at L4, has a question of a possible anterior wall fracture of the sacrococcygeal interfac distal S5.  At this point there is no surgical indication warranted.  Given her medical history she would be high risk surgical candidate.  Conservative treatment for her lumbar compression fracture includes TLSO bracing for comfort.  For the lower sacral fracture this also is nonsurgical.  Continue to treat with rest, consider padded seating, if she continues to have coccygeal type pain could refer for possible injections.  She states that her biggest pain complaint is in her right lower extremity  which she had even prior to her past 2 falls, she has not yet had any orthopedic evaluation.  This does not appear to be neurologic in origin. We can see her in follow-up with upright x-rays in clinic.  Lovenia Kim, MD/MSCR Dept. of Neurosurgery

## 2023-04-15 NOTE — Progress Notes (Signed)
Occupational Therapy Treatment Patient Details Name: [name] MRN: [id number] DOB: 1945/08/04 Today's Date: 04/15/2023   History of present illness 77 yo female brought to ED from home s/p fall on spilt juice with inability to get up until 3am when able to call 911 with severe pain to back and pelvis and R posterior head hematoma. CT findings: No acute traumatic injury to c spine, chest,  abdomen, or pelvis, minimally displaced acute fx of S5, mild L4 superior endplate compression fx is age indeterminate, multilevel spinal stenosis mod to severe at L4-L5 and mild to mod at L2-L3 and L3-L4, Chronic mild/mod degenerative spinal stenosis at T10-T11 and mod to large layering R PE has slightly decreased compared to 04/07/23.   OT comments  Ms. Gambel was seen for OT treatment on this date. Upon arrival to room pt seated at EOB, agreeable to OT Tx session. OT facilitated ADL management with education and assist as described below. See ADL section for additional details regarding occupational performance. Pt continues to be functionally limited by significant low back pain with movement, decreased activity tolerance, and limited functional use of her LUE. She requires SUPERVISION for safety for all ADL management during session. Pt return verbalizes understanding of education provided t/o session. Pt is progressing toward OT goals and continues to benefit from skilled OT services to maximize return to PLOF and minimize risk of future falls, injury, caregiver burden, and readmission. Will continue to follow POC as written. Discharge recommendation remains appropriate.        If plan is discharge home, recommend the following:  A little help with walking and/or transfers;A little help with bathing/dressing/bathroom;Assistance with cooking/housework;Assist for transportation;Help with stairs or ramp for entrance   Equipment Recommendations  Hospital bed    Recommendations for Other Services       Precautions / Restrictions Precautions Precautions: Fall Restrictions Weight Bearing Restrictions: No       Mobility Bed Mobility Overal bed mobility: Needs Assistance             General bed mobility comments: NT pt seated EOB at start of session and in transport chair at end of session.    Transfers Overall transfer level: Needs assistance Equipment used: Rolling walker (2 wheels) Transfers: Sit to/from Stand Sit to Stand: Supervision           General transfer comment: Able to STS from variable heights during session.     Balance Overall balance assessment: Needs assistance, History of Falls Sitting-balance support: Feet supported, No upper extremity supported Sitting balance-Leahy Scale: Good Sitting balance - Comments: No LOB appreciated during weight shift with functional activity.   Standing balance support: During functional activity, Bilateral upper extremity supported Standing balance-Leahy Scale: Good Standing balance comment: able to static stand without UE support.                           ADL either performed or assessed with clinical judgement   ADL Overall ADL's : Needs assistance/impaired                     Lower Body Dressing: Set up;Supervision/safety;Cueing for compensatory techniques;Sit to/from stand;Adhering to back precautions;With adaptive equipment Lower Body Dressing Details (indicate cue type and reason): Pt educated on safe use of LH reacher and sock aid for LB dressing. She is able to doff/don bilat hospital socks with min cueing for technique during session. Toilet Transfer: Ambulation;Rolling walker (2 wheels);Supervision/safety;Set up  Toileting- Clothing Manipulation and Hygiene: Supervision/safety;Sit to/from stand       Functional mobility during ADLs: Rolling walker (2 wheels);Set up;Supervision/safety General ADL Comments: Improved safety and functional independence using LH equipment as compared to  previous session.    Extremity/Trunk Assessment Upper Extremity Assessment Upper Extremity Assessment: LUE deficits/detail LUE Deficits / Details: Hx of multiple sx in LUE, significant pain with functional use and weight bearing. Able to use to stabalize objects and as "helper hand" but othwerise limits use as much as possible 2/2 pain.   Lower Extremity Assessment Lower Extremity Assessment: Generalized weakness   Cervical / Trunk Assessment Cervical / Trunk Assessment: Back Surgery    Vision Patient Visual Report: No change from baseline     Perception     Praxis      Cognition Arousal: Alert Behavior During Therapy: WFL for tasks assessed/performed Overall Cognitive Status: Within Functional Limits for tasks assessed                                          Exercises Other Exercises Other Exercises: Pt educated on use of LH AE to support safety and functional independence with ADL management. Educated on falls prevention strategies for home and hospital and reviewed log-roll technique for improved comfort with bed mobility.    Shoulder Instructions       General Comments      Pertinent Vitals/ Pain       Pain Assessment Pain Assessment: 0-10 Pain Score: 5  Pain Location: back/tailbone Pain Descriptors / Indicators: Aching, Sore, Grimacing Pain Intervention(s): Limited activity within patient's tolerance, Monitored during session, Repositioned  Home Living                                          Prior Functioning/Environment              Frequency  Min 1X/week        Progress Toward Goals  OT Goals(current goals can now be found in the care plan section)  Progress towards OT goals: Progressing toward goals  Acute Rehab OT Goals Patient Stated Goal: to go home safely OT Goal Formulation: With patient Time For Goal Achievement: 04/28/23 Potential to Achieve Goals: Good  Plan      Co-evaluation                  AM-PAC OT "6 Clicks" Daily Activity     Outcome Measure   Help from another person eating meals?: None Help from another person taking care of personal grooming?: A Little Help from another person toileting, which includes using toliet, bedpan, or urinal?: A Little Help from another person bathing (including washing, rinsing, drying)?: A Little Help from another person to put on and taking off regular upper body clothing?: None Help from another person to put on and taking off regular lower body clothing?: A Little 6 Click Score: 20    End of Session Equipment Utilized During Treatment: Rolling walker (2 wheels)  OT Visit Diagnosis: Other abnormalities of gait and mobility (R26.89);Muscle weakness (generalized) (M62.81);Unsteadiness on feet (R26.81);Repeated falls (R29.6);History of falling (Z91.81)   Activity Tolerance Patient tolerated treatment well;Patient limited by pain   Patient Left  (seated in chair with family member present)   Nurse Communication Mobility status  Time: 1610-9604 OT Time Calculation (min): 36 min  Charges: OT General Charges $OT Visit: 1 Visit OT Treatments $Self Care/Home Management : 23-37 mins  Rockney Ghee, M.S., OTR/L 04/15/23, 3:19 PM

## 2023-04-15 NOTE — Assessment & Plan Note (Signed)
As above.

## 2023-04-16 DIAGNOSIS — S32040A Wedge compression fracture of fourth lumbar vertebra, initial encounter for closed fracture: Secondary | ICD-10-CM | POA: Diagnosis not present

## 2023-04-16 DIAGNOSIS — C8599 Non-Hodgkin lymphoma, unspecified, extranodal and solid organ sites: Secondary | ICD-10-CM | POA: Diagnosis not present

## 2023-04-16 DIAGNOSIS — I482 Chronic atrial fibrillation, unspecified: Secondary | ICD-10-CM

## 2023-04-16 DIAGNOSIS — S3210XA Unspecified fracture of sacrum, initial encounter for closed fracture: Secondary | ICD-10-CM | POA: Diagnosis not present

## 2023-04-16 LAB — CBC
HCT: 33.6 % — ABNORMAL LOW (ref 36.0–46.0)
Hemoglobin: 11 g/dL — ABNORMAL LOW (ref 12.0–15.0)
MCH: 30.6 pg (ref 26.0–34.0)
MCHC: 32.7 g/dL (ref 30.0–36.0)
MCV: 93.3 fL (ref 80.0–100.0)
Platelets: 134 10*3/uL — ABNORMAL LOW (ref 150–400)
RBC: 3.6 MIL/uL — ABNORMAL LOW (ref 3.87–5.11)
RDW: 13.3 % (ref 11.5–15.5)
WBC: 6.8 10*3/uL (ref 4.0–10.5)
nRBC: 0 % (ref 0.0–0.2)

## 2023-04-16 LAB — MAGNESIUM: Magnesium: 1.9 mg/dL (ref 1.7–2.4)

## 2023-04-16 LAB — BASIC METABOLIC PANEL
Anion gap: 4 — ABNORMAL LOW (ref 5–15)
BUN: 19 mg/dL (ref 8–23)
CO2: 26 mmol/L (ref 22–32)
Calcium: 8.2 mg/dL — ABNORMAL LOW (ref 8.9–10.3)
Chloride: 106 mmol/L (ref 98–111)
Creatinine, Ser: 0.8 mg/dL (ref 0.44–1.00)
GFR, Estimated: >60 mL/min (ref >60)
Glucose, Bld: 114 mg/dL — ABNORMAL HIGH (ref 70–99)
Potassium: 3.8 mmol/L (ref 3.5–5.1)
Sodium: 136 mmol/L (ref 135–145)

## 2023-04-16 MED ORDER — ALPRAZOLAM 0.25 MG PO TABS
0.2500 mg | ORAL_TABLET | Freq: Two times a day (BID) | ORAL | Status: DC
  Administered 2023-04-16 – 2023-04-17 (×3): 0.25 mg via ORAL
  Filled 2023-04-16 (×3): qty 1

## 2023-04-16 MED ORDER — APIXABAN 2.5 MG PO TABS
2.5000 mg | ORAL_TABLET | Freq: Two times a day (BID) | ORAL | Status: DC
  Administered 2023-04-16 – 2023-04-17 (×2): 2.5 mg via ORAL
  Filled 2023-04-16 (×2): qty 1

## 2023-04-16 MED ORDER — HYDROCODONE-ACETAMINOPHEN 5-325 MG PO TABS
1.0000 | ORAL_TABLET | Freq: Four times a day (QID) | ORAL | Status: DC | PRN
  Administered 2023-04-16 – 2023-04-17 (×3): 1 via ORAL
  Filled 2023-04-16 (×3): qty 1

## 2023-04-16 NOTE — Progress Notes (Signed)
Physical Therapy Treatment Patient Details Name: [name] MRN: [id number] DOB: [birth year] Today's Date: 04/16/2023   History of Present Illness 77 yo female brought to ED from home s/p fall on spilt juice with inability to get up until 3am when able to call 911 with severe pain to back and pelvis and R posterior head hematoma. CT findings: No acute traumatic injury to c spine, chest,  abdomen, or pelvis, minimally displaced acute fx of S5, mild L4 superior endplate compression fx is age indeterminate, multilevel spinal stenosis mod to severe at L4-L5 and mild to mod at L2-L3 and L3-L4, Chronic mild/mod degenerative spinal stenosis at T10-T11 and mod to large layering R PE has slightly decreased compared to 04/07/23.    PT Comments  Ready for session.  To EOB with rails and min a x 1. Steady in sitting with assist to don back brace.  She stands with light min a x 1 due to pain and is able to walk x 4 laps on unit with RW and cga x 1.  Pt stated pain is better when walking vs seated or rest.  To bathoom to void and handles her own clothing.  She does have trouble when her pants fall to floor as she is unable to bend over to pick them up.  Would recommend dress or adaptive technique to keep pants from falling to floor. Discussed with son. She opts to keep back brace on in bed as she stated the compression does feel better.  She is aware she can call for help to remove it if it becomes uncomfortable.     If plan is discharge home, recommend the following: A little help with bathing/dressing/bathroom;Assistance with cooking/housework;A little help with walking and/or transfers;Assist for transportation   Can travel by private vehicle        Equipment Recommendations  Hospital bed    Recommendations for Other Services       Precautions / Restrictions Precautions Precautions: Fall Restrictions Weight Bearing Restrictions: No     Mobility  Bed Mobility Overal bed mobility: Needs  Assistance Bed Mobility: Supine to Sit, Sit to Supine     Supine to sit: Min assist, HOB elevated, Used rails Sit to supine: Contact guard assist, HOB elevated, Used rails     Patient Response: Cooperative  Transfers Overall transfer level: Needs assistance Equipment used: Rolling walker (2 wheels) Transfers: Sit to/from Stand Sit to Stand: Contact guard assist, Min assist                Ambulation/Gait Ambulation/Gait assistance: Contact guard assist Gait Distance (Feet): 640 Feet Assistive device: Rolling walker (2 wheels) Gait Pattern/deviations: Step-through pattern Gait velocity: decreased     General Gait Details: x 4 laps during session, x 2 laps earlier with family   Stairs             Wheelchair Mobility     Tilt Bed Tilt Bed Patient Response: Cooperative  Modified Rankin (Stroke Patients Only)       Balance Overall balance assessment: Needs assistance, History of Falls Sitting-balance support: Feet supported, No upper extremity supported Sitting balance-Leahy Scale: Good     Standing balance support: Bilateral upper extremity supported, During functional activity Standing balance-Leahy Scale: Fair                              Cognition Arousal: Alert Behavior During Therapy: WFL for tasks assessed/performed Overall Cognitive Status: Within Functional  Limits for tasks assessed                                          Exercises      General Comments        Pertinent Vitals/Pain Pain Assessment Pain Assessment: PAINAD Pain Score: 6  Pain Location: back/tailbone Pain Descriptors / Indicators: Aching, Sore, Grimacing Pain Intervention(s): Limited activity within patient's tolerance, Monitored during session, Repositioned    Home Living                          Prior Function            PT Goals (current goals can now be found in the care plan section) Progress towards PT goals:  Progressing toward goals    Frequency    Min 1X/week      PT Plan      Co-evaluation              AM-PAC PT "6 Clicks" Mobility   Outcome Measure  Help needed turning from your back to your side while in a flat bed without using bedrails?: A Little Help needed moving from lying on your back to sitting on the side of a flat bed without using bedrails?: A Little Help needed moving to and from a bed to a chair (including a wheelchair)?: A Little Help needed standing up from a chair using your arms (e.g., wheelchair or bedside chair)?: A Little Help needed to walk in hospital room?: A Little Help needed climbing 3-5 steps with a railing? : A Little 6 Click Score: 18    End of Session Equipment Utilized During Treatment: Gait belt Activity Tolerance: Patient tolerated treatment well Patient left: in bed;with call bell/phone within reach;with bed alarm set Nurse Communication: Mobility status PT Visit Diagnosis: Muscle weakness (generalized) (M62.81);Unsteadiness on feet (R26.81)     Time: 4401-0272 PT Time Calculation (min) (ACUTE ONLY): 19 min  Charges:    $Gait Training: 8-22 mins PT General Charges $$ ACUTE PT VISIT: 1 Visit                    Danielle Dess, PTA 04/16/23, 1:16 PM

## 2023-04-16 NOTE — Progress Notes (Signed)
Pt refused IV start. Pt stated, "I am not getting another IV unless the Doctor himself tells me that I need it." Primary RN notified.

## 2023-04-16 NOTE — Plan of Care (Signed)
  Problem: Education: Goal: Knowledge of General Education information will improve Description Including pain rating scale, medication(s)/side effects and non-pharmacologic comfort measures Outcome: Progressing   Problem: Health Behavior/Discharge Planning: Goal: Ability to manage health-related needs will improve Outcome: Progressing   

## 2023-04-16 NOTE — Progress Notes (Signed)
   04/16/23 1000  Spiritual Encounters  Type of Visit Initial  Care provided to: Patient  Referral source Patient request  Reason for visit Advance directives  OnCall Visit Yes  Spiritual Framework  Patient Stress Factors Health changes;Loss  Interventions  Spiritual Care Interventions Made Compassionate presence;Reflective listening;Prayer   Chaplain responded to [facility] consult to provide information for Advance Directives. Paperwork given and explained. Chaplain inquired further about patients situation allowing her to express the pain or present health concerns along with previous loss of family members.

## 2023-04-16 NOTE — TOC Progression Note (Signed)
Transition of Care [facility]) - Progression Note    Patient Details  Name: [name] MRN: [id number] Date of Birth: 1946/04/06  Transition of Care [facility]) CM/SW Contact  Kemper Durie, RN Phone Number: 04/16/2023, 11:42 AM  Clinical Narrative:     Per MD, plan to discharge patient home tomorrow.  TOC will notify [facility] of plan.   Expected Discharge Plan: Home w Home Health Services Barriers to Discharge: Continued Medical Work up  Expected Discharge Plan and Services       Living arrangements for the past 2 months: Apartment                                       Social Determinants of Health (SDOH) Interventions SDOH Screenings   Food Insecurity: No Food Insecurity (04/14/2023)  Housing: Low Risk  (04/14/2023)  Transportation Needs: No Transportation Needs (04/14/2023)  Utilities: Not At Risk (04/14/2023)  Depression (PHQ2-9): Low Risk  (04/13/2023)  Tobacco Use: Low Risk  (04/13/2023)    Readmission Risk Interventions    04/10/2023    1:51 PM  Readmission Risk Prevention Plan  Transportation Screening Complete  PCP or Specialist Appt within 3-5 Days Complete  Palliative Care Screening Complete  Medication Review (RN Care Manager) Complete

## 2023-04-16 NOTE — Progress Notes (Signed)
Progress Note Patient: [name] WUJ:[id number] DOB: 23-Sep-1945 DOA: 04/14/2023     2 DOS: the patient was seen and examined on 04/16/2023  Brief hospital course: [name] is a 77 y.o. female with medical history significant of a fib and DVT on Eliquis, HTN, diet-controlled diabetes, COPD on 2 L oxygen at night, hypothyroidism, GERD, depression, anemia, CKD-3A presenting with recurrent falls, lumbosacral fracture.  Patient currently lives at home alone.  Noted to have been recently admitted November 1 through November 6 for right-sided pleural effusion with concern for malignancy. Recently diagnosed with lymphoma.   Pt was found to have an L4 compression fracture and possible S5 sacrococcygeal fracture on CT imaging.   Neurosurgery was consulted and recommend conservative management with TLSO brace for L4 compression fracture for comfort.  Sacral fracture non-surgical.  Stable for discharge with HH PT and OT once pain management is tolerable.   Assessment and Plan:  Fall --Fall precautions --PT OT evaluation- recommending HH  Lumbar compression fracture (HCC) L4 compression fracture  S5 fracture Both injuries being managed conservatively --Neurosurgery consulted --TLSO brace for comfort --Pain control & bowel regimen --PT OT evaluations --Padded seating - ordered hospital bed --Follow up with Neurosurgery outpatient for upright x-rays  Type II diabetes mellitus with renal manifestations (HCC) Sliding scale Novolog Titrate regimen  Anxiety- previously treated with xanax. She has significant anxiety and grief  with new lymphoma dx and acute pain/illness.  - PRN xanax restarted  HTN (hypertension) BP's are soft. On opioid pain meds Holding home enalapril, Lasix, metoprolol. Resume meds gradually as BP allows.  Chronic kidney disease, stage 3a (HCC) Stable. Monitor BMP.  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 S/p  thoracentesis 04/09/23 Pleural fluid analysis (+)leukocytosis monocyte predominant, and significant elevation in LDH. DDx infectious etiologies, malignancy --Following by Dr. Cathie [name] w/ oncology w/ concern for lymphoma   Hypothyroidism Continue Synthroid  COPD (chronic obstructive pulmonary disease) (HCC) Stable. Continue home regimen  Atrial fibrillation, chronic (HCC) --Continue amiodarone and metoprolol --Cont Eliquis at half dose per patients preference after shared-decision making discussion on frequent falls and clot risk.  --Cardiology consulted on admission to  to discuss risk vs benefits of anticoagulation given multiple falls.  DVT and concern for malignancy w/ lymphoma.   H/o DVT (deep venous thrombosis) (HCC) On Eliquis  Subjective: Pt reports significant anxiety about hospital bed at home. Her pain is poorly controlled. She is anxious about her many medical problems.   Physical Exam: Vitals:   04/15/23 0750 04/15/23 1528 04/15/23 2320 04/16/23 0718  BP: (!) 113/54 97/60 (!) 104/48 118/74  Pulse: (!) 59 65 63 (!) 59  Resp: 16 16 18 16   Temp: 97.9 F (36.6 C) 98.1 F (36.7 C) 98.7 F (37.1 C) 97.7 F (36.5 C)  TempSrc:   Oral   SpO2: 93% 94% 92% 98%  Weight:      Height:       General exam: awake, alert, in acute distress HEENT: moist mucus membranes, hearing grossly normal  Respiratory system: CTAB but generally diminished, no expiratory wheezes, normal respiratory effort. Cardiovascular system: normal S1/S2, RRR, no pedal edema.   Central nervous system: A&O x3. no gross focal neurologic deficits, normal speech Extremities: moves all, no edema, normal tone. In TSLO brace Skin: dry, intact, normal temperature Psychiatry: anxious mood, congruent affect, judgement and insight appear normal  Data Reviewed:    Latest Ref Rng & Units 04/16/2023    4:48 AM 04/15/2023  4:15 AM 04/14/2023    6:27 AM  CBC  WBC 4.0 - 10.5 K/uL 6.8  7.6  12.3   Hemoglobin 12.0 -  15.0 g/dL 09.8  11.9  14.7   Hematocrit 36.0 - 46.0 % 33.6  32.1  38.7   Platelets 150 - 400 K/uL 134  139  156       Latest Ref Rng & Units 04/16/2023    4:48 AM 04/15/2023    4:15 AM 04/14/2023    6:27 AM  BMP  Glucose 70 - 99 mg/dL 829  562  130   BUN 8 - 23 mg/dL 19  19  34   Creatinine 0.44 - 1.00 mg/dL 8.65  7.84  6.96   Sodium 135 - 145 mmol/L 136  136  132   Potassium 3.5 - 5.1 mmol/L 3.8  3.8  4.4   Chloride 98 - 111 mmol/L 106  103  95   CO2 22 - 32 mmol/L 26  24  26    Calcium 8.9 - 10.3 mg/dL 8.2  8.2  8.8    Family Communication: [name] and [name] at bedside.   Disposition: Status is: Inpatient Remains inpatient appropriate because: Ongoing evaluation   Planned Discharge Destination: [facility] tomorrow  Time spent: 42 minutes  Author: Leeroy Bock, MD 04/16/2023 7:36 AM  For on call review [email or web address].

## 2023-04-17 DIAGNOSIS — I482 Chronic atrial fibrillation, unspecified: Secondary | ICD-10-CM | POA: Diagnosis not present

## 2023-04-17 DIAGNOSIS — S3210XA Unspecified fracture of sacrum, initial encounter for closed fracture: Secondary | ICD-10-CM | POA: Diagnosis not present

## 2023-04-17 DIAGNOSIS — W19XXXA Unspecified fall, initial encounter: Secondary | ICD-10-CM | POA: Diagnosis not present

## 2023-04-17 DIAGNOSIS — S32040A Wedge compression fracture of fourth lumbar vertebra, initial encounter for closed fracture: Secondary | ICD-10-CM | POA: Diagnosis not present

## 2023-04-17 DIAGNOSIS — F419 Anxiety disorder, unspecified: Secondary | ICD-10-CM

## 2023-04-17 LAB — BASIC METABOLIC PANEL
Anion gap: 7 (ref 5–15)
BUN: 16 mg/dL (ref 8–23)
CO2: 25 mmol/L (ref 22–32)
Calcium: 8.4 mg/dL — ABNORMAL LOW (ref 8.9–10.3)
Chloride: 103 mmol/L (ref 98–111)
Creatinine, Ser: 0.62 mg/dL (ref 0.44–1.00)
GFR, Estimated: >60 mL/min (ref >60)
Glucose, Bld: 98 mg/dL (ref 70–99)
Potassium: 4 mmol/L (ref 3.5–5.1)
Sodium: 135 mmol/L (ref 135–145)

## 2023-04-17 LAB — CBC
HCT: 33.5 % — ABNORMAL LOW (ref 36.0–46.0)
Hemoglobin: 11.1 g/dL — ABNORMAL LOW (ref 12.0–15.0)
MCH: 30.3 pg (ref 26.0–34.0)
MCHC: 33.1 g/dL (ref 30.0–36.0)
MCV: 91.5 fL (ref 80.0–100.0)
Platelets: 137 10*3/uL — ABNORMAL LOW (ref 150–400)
RBC: 3.66 MIL/uL — ABNORMAL LOW (ref 3.87–5.11)
RDW: 13.2 % (ref 11.5–15.5)
WBC: 7 10*3/uL (ref 4.0–10.5)
nRBC: 0 % (ref 0.0–0.2)

## 2023-04-17 LAB — COMP PANEL: LEUKEMIA/LYMPHOMA

## 2023-04-17 MED ORDER — ENSURE ENLIVE PO LIQD: 237.0000 mL | Freq: Two times a day (BID) | ORAL | 12 refills | Status: DC

## 2023-04-17 MED ORDER — ALPRAZOLAM 0.25 MG PO TABS: 0.2500 mg | ORAL_TABLET | Freq: Two times a day (BID) | ORAL | 0 refills | Status: DC

## 2023-04-17 MED ORDER — HYDROCODONE-ACETAMINOPHEN 5-325 MG PO TABS: 1.0000 | ORAL_TABLET | Freq: Four times a day (QID) | ORAL | 0 refills | Status: DC | PRN

## 2023-04-17 MED ORDER — APIXABAN 2.5 MG PO TABS: 2.5000 mg | ORAL_TABLET | Freq: Two times a day (BID) | ORAL | 1 refills | Status: DC

## 2023-04-17 NOTE — Progress Notes (Signed)
Patient was given verbal and written discharge instructions, she acknowledge understanding and states she will comply. Waiting on [facility] to pick up patient.

## 2023-04-17 NOTE — Progress Notes (Signed)
SATURATION QUALIFICATIONS: (This note is used to comply with regulatory documentation for home oxygen)  Patient Saturations on Room Air at Rest = 90  Patient Saturations on Room Air while Ambulating = 88%  Patient Saturations on 2 Liters of oxygen while Ambulating = 97%  Please briefly explain why patient needs home oxygen: 

## 2023-04-17 NOTE — Care Management Important Message (Signed)
Important Message  Patient Details  Name: [name] MRN: [id number] Date of Birth: Jun 29, 1945   Important Message Given:  N/A - LOS <3 / Initial given by admissions     Olegario Messier A Margo Lama 04/17/2023, 10:10 AM

## 2023-04-17 NOTE — Plan of Care (Signed)
  Problem: Education: Goal: Knowledge of General Education information will improve Description: Including pain rating scale, medication(s)/side effects and non-pharmacologic comfort measures Outcome: Progressing   Problem: Activity: Goal: Risk for activity intolerance will decrease Outcome: Progressing   Problem: Nutrition: Goal: Adequate nutrition will be maintained Outcome: Progressing   Problem: Coping: Goal: Level of anxiety will decrease Outcome: Progressing   Problem: Pain Management: Goal: General experience of comfort will improve Outcome: Progressing   Problem: Safety: Goal: Ability to remain free from injury will improve Outcome: Progressing

## 2023-04-17 NOTE — Discharge Summary (Signed)
Physician Discharge Summary  Patient: [name] EAV:[id number] DOB: 08-Feb-1946   Code Status: Full Code Admit date: 04/14/2023 Discharge date: 04/17/2023 Disposition: Home, [facility] PCP: [facility]  Recommendations for Outpatient Follow-up:  Follow up with PCP within 1-2 weeks Regarding general hospital follow up and preventative care Follow up with PACE same day Monitor blood pressure/HR, anticoagulation needs as described in detail below Follow up with neurosurgery Regarding follow up monitoring of vertebral fracture Follow up with oncology Regarding PET scan, BMB  Discharge Diagnoses:  Principal Problem:   Fall Active Problems:   Lumbar compression fracture (HCC)   Sacral fracture, closed (HCC)   HTN (hypertension)   Type II diabetes mellitus with renal manifestations (HCC)   COPD (chronic obstructive pulmonary disease) (HCC)   Hypothyroidism   Pleural effusion on right   Chronic kidney disease, stage 3a (HCC)   Atrial fibrillation, chronic (HCC)   DVT (deep venous thrombosis) (HCC)   Lymphoma involving liver [facility])  Brief Hospital Course Summary: [name] is a 77 y.o. female with medical history significant of a fib and DVT on Eliquis, HTN, diet-controlled diabetes, COPD on 2 L oxygen at night, hypothyroidism, GERD, depression, anemia, CKD-3A presenting with recurrent falls, lumbosacral fracture.  Patient currently lives at home alone.  Noted to have been recently admitted November 1 through November 6 for right-sided pleural effusion with concern for lymphoma.   ED course:afebrile, hemodynamically stable. Satting well on room air. White count 12.3, hemoglobin 12.8, platelets 156, creatinine 1.02, glucose 199, CK 57. CT head and CT C-spine grossly within normal limits. CT abdomen pelvis grossly stable apart from moderately to large layering right pleural effusion decreasing compared to prior CT scan. Positive cirrhotic changes. CT L-spine showing  displaced acute fracture of lower sacrum at S5 as well as mild L4 superior endplate compression fracture. Advanced lumbar spinal disc disease and multilevel spinal stenosis moderate to severe at L4-L5.    Neurosurgery was consulted and recommend conservative management with TLSO brace when ambulating for comfort. PT/OT evaluated and recommended HH services. Pain is moderately well controlled with norco and tylenol while resting. She will need to follow up with neurosurgery for follow up xrays. Recommend bowel regimen to prevent constipation with narcotic pain medications.   Due to frequent falls and increased risk of bleeding, discussed continued eliquis use with patient and family members. She has history of DVT and afib which is rate controlled. They discussed risks/benefits with cardiology and ultimately decided to decrease eliquis dose to 2.5mg  BID at this time and may discontinue it in the future if continues to have frequent falls.    Patient was having severely disruptive anxiety during admission in setting of recent cancer diagnosis, liver cirrhosis, several hospitalizations, falls, injuries. We did a trial of low-dose ativan and she tolerated very well and had improvement in her symptoms. I think the benefit outweighs the risks of continuing this medication currently despite possible fall contribution. Would review need for it and reduce/discontinue in the future when she is further away from the acute overwhelming news of several medical conditions.   Home blood pressure medications were held due to borderline low blood pressures and heart rate as listed below. Please assess if these need to be titrated back on in the follow up appointments.   Will likely need to reschedule her PET scan in setting of acute back pain limiting her tolerability for the position in the imaging.   Discharge Condition: Stable, improved Recommended discharge diet: Regular  healthy diet  Consultations: Cardiology   Neurosurgery   Procedures/Studies: None   Allergies as of 04/17/2023       Reactions   Cephalosporins Itching   Codeine Other (See Comments)   Extreme drowsiness   Contrast Media [iodinated Contrast Media] Hives, Swelling   Facial swelling   Oxycodone Other (See Comments)   Extreme drowsiness        Medication List     STOP taking these medications    amoxicillin-clavulanate 875-125 MG tablet Commonly known as: AUGMENTIN   azithromycin 250 MG tablet Commonly known as: ZITHROMAX   busPIRone 5 MG tablet Commonly known as: BUSPAR   enalapril 20 MG tablet Commonly known as: VASOTEC   furosemide 20 MG tablet Commonly known as: LASIX   loperamide 2 MG capsule Commonly known as: IMODIUM   metoprolol tartrate 25 MG tablet Commonly known as: LOPRESSOR   potassium chloride 10 MEQ tablet Commonly known as: KLOR-CON   traMADol 50 MG tablet Commonly known as: ULTRAM       TAKE these medications    acetaminophen 325 MG tablet Commonly known as: TYLENOL Take 1,300 mg by mouth every 6 (six) hours as needed for moderate pain.   Advair HFA 115-21 MCG/ACT inhaler Generic drug: fluticasone-salmeterol Inhale 2 puffs into the lungs 2 (two) times daily.   albuterol (2.5 MG/3ML) 0.083% nebulizer solution Commonly known as: PROVENTIL Take 2.5 mg by nebulization every 4 (four) hours as needed for wheezing or shortness of breath.   albuterol 108 (90 Base) MCG/ACT inhaler Commonly known as: VENTOLIN HFA Inhale 2-4 puffs by mouth every 4 hours as needed for wheezing, cough, and/or shortness of breath   ALPRAZolam 0.25 MG tablet Commonly known as: XANAX Take 1 tablet (0.25 mg total) by mouth 2 (two) times daily.   amiodarone 100 MG tablet Commonly known as: PACERONE Take 1 tablet (100 mg total) by mouth daily.   apixaban 2.5 MG Tabs tablet Commonly known as: ELIQUIS Take 1 tablet (2.5 mg total) by mouth 2 (two) times daily. What changed:  medication  strength how much to take   bismuth subsalicylate 262 MG/15ML suspension Commonly known as: PEPTO BISMOL Take 30 mLs by mouth every 6 (six) hours as needed for indigestion or diarrhea or loose stools.   esomeprazole 40 MG capsule Commonly known as: NEXIUM Take 40 mg by mouth 2 (two) times daily.   eucerin lotion Apply 1 Application topically as needed for dry skin.   feeding supplement Liqd Take 237 mLs by mouth 2 (two) times daily between meals.   ferrous sulfate 325 (65 FE) MG tablet Take 325 mg by mouth daily.   fluticasone 50 MCG/ACT nasal spray Commonly known as: FLONASE Place 2 sprays into both nostrils daily as needed for allergies or rhinitis.   guaiFENesin 600 MG 12 hr tablet Commonly known as: MUCINEX Take 600 mg by mouth 2 (two) times daily as needed for to loosen phlegm or cough.   HYDROcodone-acetaminophen 5-325 MG tablet Commonly known as: NORCO/VICODIN Take 1 tablet by mouth every 6 (six) hours as needed for up to 5 days for moderate pain (pain score 4-6) or severe pain (pain score 7-10).   ipratropium-albuterol 0.5-2.5 (3) MG/3ML Soln Commonly known as: DUONEB Take 3 mLs by nebulization every 4 (four) hours as needed.   ketoconazole 2 % shampoo Commonly known as: NIZORAL Apply 1 Application topically 2 (two) times a week.   ketotifen 0.025 % ophthalmic solution Commonly known as: ZADITOR Place 1 drop into both  eyes daily as needed.   levothyroxine 125 MCG tablet Commonly known as: SYNTHROID Take 125 mcg by mouth daily before breakfast.   loratadine 10 MG tablet Commonly known as: CLARITIN Take 1 tablet (10 mg total) by mouth daily. What changed:  when to take this reasons to take this   Magnesium 250 MG Tabs Take 1 tablet by mouth daily.   nitroGLYCERIN 0.4 MG SL tablet Commonly known as: NITROSTAT Place 0.4 mg under the tongue every 5 (five) minutes as needed for chest pain.   olopatadine 0.1 % ophthalmic solution Commonly known as:  PATANOL Place 1 drop into both eyes daily as needed for allergies.   Omega-3 1000 MG Caps Take 1 capsule by mouth daily.   ondansetron 4 MG tablet Commonly known as: ZOFRAN Take 4 mg by mouth every 8 (eight) hours as needed for nausea or vomiting.   OXYGEN Inhale 2 L into the lungs at bedtime.   Refresh Optive Advanced 0.5-1-0.5 % Soln Generic drug: Carboxymeth-Glycerin-Polysorb Apply 2 drops to eye daily.   rOPINIRole 0.25 MG tablet Commonly known as: REQUIP Take 0.25 mg by mouth at bedtime.   sodium chloride 0.65 % Soln nasal spray Commonly known as: OCEAN Place 1 spray into both nostrils as needed for congestion.   Systane Ultra 0.4-0.3 % Soln Generic drug: Polyethyl Glycol-Propyl Glycol Apply 2 drops to eye QID.   traZODone 50 MG tablet Commonly known as: DESYREL Take 50 mg by mouth at bedtime as needed for sleep.   TURMERIC PO Take 500 mg by mouth daily.   VITAMIN C CR 1500 MG Tbcr Take 1 tablet by mouth daily.   VITAMIN D-VITAMIN K PO Take 45 mcg by mouth daily.   Voltaren 1 % Gel Generic drug: diclofenac Sodium Apply 2 g topically 4 (four) times daily as needed.   Zinc Oxide 10 % Oint Apply 1 application  topically 3 (three) times daily as needed.               Durable Medical Equipment  (From admission, onward)           Start     Ordered   04/16/23 0923  For home use only DME Hospital bed  Once       Question Answer Comment  Length of Need 12 Months   The above medical condition requires: Patient requires the ability to reposition frequently   Bed type Semi-electric      04/16/23 0923           Subjective   Pt reports feeling much improved today. She did well with the new pain and anxiety medications. Having minimal pain at rest.   All questions and concerns were addressed at time of discharge.  Objective  Blood pressure 132/60, pulse 70, temperature 98.2 F (36.8 C), temperature source Oral, resp. rate 18, height 5\' 2"   (1.575 m), weight 63.5 kg, SpO2 97%.   General: Pt is alert, awake, not in acute distress- but grimaces when coughs due to back pain exacerbated Cardiovascular: RRR, S1/S2 +, no rubs, no gallops Respiratory: CTA bilaterally, no wheezing, no rhonchi Abdominal: Soft, NT, ND, bowel sounds + Extremities: no edema, no cyanosis. TSLO brace in place.   The results of significant diagnostics from this hospitalization (including imaging, microbiology, ancillary and laboratory) are listed below for reference.   Imaging studies: CT L-SPINE NO CHARGE  Result Date: 04/14/2023 CLINICAL DATA:  77 year old female fell, found down. On blood thinners. Head hematoma. EXAM: CT LUMBAR SPINE  WITHOUT CONTRAST TECHNIQUE: Technique: Multiplanar CT images of the lumbar spine were reconstructed from contemporary CT of the Abdomen and Pelvis. RADIATION DOSE REDUCTION: This exam was performed according to the departmental dose-optimization program which includes automated exposure control, adjustment of the mA and/or kV according to patient size and/or use of iterative reconstruction technique. CONTRAST:  None COMPARISON:  CT thoracic, CT Chest, Abdomen, and Pelvis today reported separately. FINDINGS: Segmentation: Normal. Alignment: Mild dextroconvex upper and levoconvex lower lumbar scoliosis. Straightening of lordosis. No significant spondylolisthesis. Vertebrae: Diffuse osteopenia. L1, L2, and L3 vertebrae appear intact. Mild L4 superior endplate compression (sagittal image 64, it is eccentric to the right (coronal image 48). L4 vertebra otherwise intact. L5 vertebra appears intact. Superior sacrum and SI joints appear intact. But there is a subtle, minimally displaced fracture of the lower sacrum in the midline S5 (sagittal image 62). Paraspinal and other soft tissues: Abdomen and pelvis are detailed separately. Lumbar paraspinal soft tissues are within normal limits. Disc levels: Lumbar spine degeneration with multilevel  vacuum disc. Moderate to severe disc space loss with disc osteophyte disease L2-L3 through L3-L4. Estimated lumbar spinal stenosis by CT as follows L2-L3: Mild to moderate, and with lateral recess stenosis greater on the left. L3-L4:  Mild-to-moderate. L4-L5:  Moderate to severe (series 2, image 82). IMPRESSION: 1. Osteopenia. Positive for a minimally displaced acute fracture of the lower sacrum at S5. And a mild L4 superior endplate compression fracture is age indeterminate. 2. Advanced lumbar spine disc and endplate degeneration contributing to multilevel spinal stenosis which is moderate to severe at L4-L5 and mild to moderate at L2-L3 and L3-L4. 3. CT Abdomen and Pelvis today reported separately. Electronically Signed   By: Odessa Fleming M.D.   On: 04/14/2023 07:19   CT T-SPINE NO CHARGE  Result Date: 04/14/2023 CLINICAL DATA:  77 year old female fell, found down. On blood thinners. Head hematoma. EXAM: CT THORACIC SPINE WITHOUT CONTRAST TECHNIQUE: Multiplanar CT images of the thoracic spine were reconstructed from contemporary CT of the Chest. RADIATION DOSE REDUCTION: This exam was performed according to the departmental dose-optimization program which includes automated exposure control, adjustment of the mA and/or kV according to patient size and/or use of iterative reconstruction technique. CONTRAST:  None COMPARISON:  CT cervical spine, CT Chest, Abdomen, and Pelvis today reported separately. Recent chest CT 04/07/2023. FINDINGS: Limited cervical spine imaging:  Reported separately. Thoracic spine segmentation: Appears normal; hypoplastic left 12th rib. Alignment: Stable thoracic kyphosis from earlier this month. No significant scoliosis or spondylolisthesis. Vertebrae: Chronic osteopenia. Subtle loss of height or compression at T5, T6, T11 is unchanged from earlier this month. No acute thoracic vertebral fracture identified. Visible posterior ribs appear intact. Paraspinal and other soft tissues: Chest and  abdomen are detailed separately. Thoracic paraspinal soft tissues remain within normal limits. Disc levels: Thoracic spine degeneration although mostly anterior flowing endplate osteophytes. Partially calcified disc and/or endplate spurring at T10-T11 with evidence of mild chronic thoracic spinal stenosis there. Superimposed facet hypertrophy. Spinal stenosis could be mild or moderate. IMPRESSION: 1. Stable CT appearance of the Thoracic Spine. Osteopenia, No acute traumatic injury identified. 2. 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 3. CT Chest, Abdomen, and Pelvis today reported separately. Electronically Signed   By: Odessa Fleming M.D.   On: 04/14/2023 07:14   CT CHEST ABDOMEN PELVIS WO CONTRAST  Result Date: 04/14/2023 CLINICAL DATA:  77 year old female fell, found down. On blood thinners. Hematoma. EXAM: CT CHEST, ABDOMEN AND PELVIS WITHOUT CONTRAST TECHNIQUE: Multidetector CT imaging  of the chest, abdomen and pelvis was performed following the standard protocol without IV contrast. RADIATION DOSE REDUCTION: This exam was performed according to the departmental dose-optimization program which includes automated exposure control, adjustment of the mA and/or kV according to patient size and/or use of iterative reconstruction technique. COMPARISON:  Thoracic and lumbar CT today reported separately. Recent chest CT 04/07/2023. FINDINGS: CT CHEST FINDINGS Cardiovascular: Calcified aortic atherosclerosis. Calcified coronary artery atherosclerosis. Stable noncontrast appearance of the heart and aorta. No cardiomegaly or pericardial effusion. Mediastinum/Nodes: Stable noncontrast mediastinum. No evidence of mass, lymphadenopathy, mediastinal hematoma. Lungs/Pleura: Layering right pleural effusion with simple fluid density remains moderate to large, slightly decreased compared to 04/07/2023. No contralateral left pleural effusion. Major airways remain patent. Compressive right lung atelectasis.  Right lower lobe ventilation has mildly improved. No pneumothorax. Musculoskeletal: Left shoulder arthroplasty. Chronic posttraumatic and/or degenerative changes at the right shoulder. Chronic left rib fractures. Thoracic spine is reported separately. No sternal fracture. CT ABDOMEN PELVIS FINDINGS Hepatobiliary: Nodular liver contour suggestive of cirrhosis (series 2, image 69). No discrete liver lesion in the absence of IV contrast. Cholecystectomy. Pancreas: Negative. Spleen: Negative, no splenomegaly.  No perisplenic fluid. Adrenals/Urinary Tract: Negative noncontrast adrenal glands, kidneys, bladder. Pelvic phleboliths. Stomach/Bowel: Postoperative changes to the right ventral abdominal wall, probably chronic cholecystectomy related. Small fat containing umbilical hernia series 2, image 83. Nearby ventral abdominal wall subcutaneous fat stranding which is probably subcutaneous injection sites (series 2, image 93). No herniated bowel. No dilated large or small bowel. Severe diverticulosis of the sigmoid colon in the pelvis. No active large bowel inflammation. Appendicoliths on series 2, image 93, but otherwise normal appendix (coronal image 44). Decompressed terminal ileum. Decompressed stomach. No free air or free fluid. No mesenteric inflammation identified. Vascular/Lymphatic: Extensive Aortoiliac calcified atherosclerosis. Vascular patency is not evaluated in the absence of IV contrast. Normal caliber abdominal aorta. No lymphadenopathy identified. Reproductive: Retroverted, otherwise negative noncontrast appearance. Other: No pelvis free fluid. Musculoskeletal: Lumbar spine is detailed separately. Sacrum, SI joints, pelvis, and proximal femurs appear intact. No superficial soft tissue injury identified. IMPRESSION: 1. No acute traumatic injury identified in the noncontrast chest, abdomen, or pelvis. See CT thoracic and lumbar spine reported separately. 2. Moderate to large layering right pleural effusion  has slightly decreased compared to 04/07/2023. Simple fluid density favors transudate (see #3). Compressive right lung atelectasis. 3. Nodular liver contour strongly suggests Cirrhosis. But no ascites, no splenomegaly. 4.  Aortic Atherosclerosis (ICD10-I70.0). Electronically Signed   By: Odessa Fleming M.D.   On: 04/14/2023 07:09   CT Cervical Spine Wo Contrast  Result Date: 04/14/2023 CLINICAL DATA:  77 year old female fell, found down. On blood thinners. Hematoma. EXAM: CT CERVICAL SPINE WITHOUT CONTRAST TECHNIQUE: Multidetector CT imaging of the cervical spine was performed without intravenous contrast. Multiplanar CT image reconstructions were also generated. RADIATION DOSE REDUCTION: This exam was performed according to the departmental dose-optimization program which includes automated exposure control, adjustment of the mA and/or kV according to patient size and/or use of iterative reconstruction technique. COMPARISON:  Head CT today.  Cervical spine CT 03/05/2023. FINDINGS: Alignment: Straightening of cervical lordosis. Cervicothoracic junction alignment is within normal limits. Bilateral posterior element alignment is within normal limits. Skull base and vertebrae: Visualized skull base is intact. No atlanto-occipital dissociation. C1 and C2 appear intact and aligned. No acute osseous abnormality identified. Soft tissues and spinal canal: No prevertebral fluid or swelling. No visible canal hematoma. Partially retropharyngeal carotids. Calcified atherosclerosis. Mild pharynx motion artifact. Disc levels: Stable cervical spine  degeneration. Multilevel disc and endplate degeneration with some ligamentous hypertrophy. 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 Upper chest: Thoracic spine and chest CT today reported separately. IMPRESSION: 1. No acute traumatic injury identified in the cervical spine. 2. 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  Electronically Signed   By: Odessa Fleming M.D.   On: 04/14/2023 06:59   CT HEAD WO CONTRAST ( )  Result Date: 04/14/2023 CLINICAL DATA:  77 year old female fell, found down. On blood thinners. Head hematoma. EXAM: CT HEAD WITHOUT CONTRAST TECHNIQUE: Contiguous axial images were obtained from the base of the skull through the vertex without intravenous contrast. RADIATION DOSE REDUCTION: This exam was performed according to the departmental dose-optimization program which includes automated exposure control, adjustment of the mA and/or kV according to patient size and/or use of iterative reconstruction technique. COMPARISON:  Head CT 03/05/2023. FINDINGS: Brain: Cerebral volume is stable, within normal limits for age. No midline shift, ventriculomegaly, mass effect, evidence of mass lesion, intracranial hemorrhage or evidence of cortically based acute infarction. Stable mild for age patchy white matter hypodensity. Otherwise normal gray-white differentiation. Vascular: Calcified atherosclerosis at the skull base. No suspicious intracranial vascular hyperdensity. Skull: Stable and intact. Sinuses/Orbits: Visualized paranasal sinuses and mastoids are stable and well aerated. Other: There is a mild right posterior convexity scalp hematoma or contusion on series 3, image 35. Underlying calvarium appears intact. No other acute orbit or scalp soft tissue injury identified. IMPRESSION: 1. Right posterior convexity scalp hematoma or contusion. No skull fracture. 2. No acute intracranial abnormality. Stable and largely negative for age non contrast CT appearance of the brain. Electronically Signed   By: Odessa Fleming M.D.   On: 04/14/2023 06:55   DG Chest Port 1 View  Result Date: 04/12/2023 CLINICAL DATA:  Cough, shortness of breath EXAM: PORTABLE CHEST 1 VIEW COMPARISON:  04/11/2023 FINDINGS: Heart size is normal. Dense mitral annulus calcifications. Benign calcified granulomatous mediastinal lymph nodes. Implantable loop  recorder. No acute airspace opacity. Benign calcified nodule specific status post left shoulder reverse arthroplasty. IMPRESSION: No acute abnormality of the lungs in AP portable projection. Electronically Signed   By: Jearld Lesch M.D.   On: 04/12/2023 09:10   DG Chest Port 1 View  Result Date: 04/11/2023 CLINICAL DATA:  Pleural effusion. EXAM: PORTABLE CHEST 1 VIEW COMPARISON:  April 10, 2023. FINDINGS: The heart size and mediastinal contours are within normal limits. Status post left shoulder arthroplasty. Old left rib fractures are noted. Increased right basilar opacity is noted concerning for worsening effusion and associated atelectasis or infiltrate. Left lung is unremarkable. IMPRESSION: Increased right basilar opacity is noted concerning for worsening effusion and associated atelectasis or infiltrate. Electronically Signed   By: Lupita Raider M.D.   On: 04/11/2023 08:51   ECHOCARDIOGRAM COMPLETE  Result Date: 04/10/2023    ECHOCARDIOGRAM REPORT   Patient Name:   SHELVIA NGUON Date of Exam: 04/09/2023 Medical Rec #:  [id number]      Height:       62.0 in Accession #:    [id number]     Weight:       141.5 lb Date of Birth:  08/30/1945       BSA:          1.650 m Patient Age:    77 years       BP:           121/76 mmHg Patient Gender: F  HR:           58 bpm. Exam Location:  [facility] Procedure: 2D Echo, Cardiac Doppler and Color Doppler Indications:    DOE R06.0  History:        Patient has prior history of Echocardiogram examinations, most                 recent 05/24/2022. COPD, Arrythmias:Atrial Fibrillation; Risk                 Factors:Hypertension and Diabetes.  Sonographer:    Dondra Prader RVT RCS Referring Phys: [id number] Sunnie Nielsen  Sonographer Comments: Patient unable to tolerate probe pressure on abdomen; no subcostal views. IMPRESSIONS  1. Left ventricular ejection fraction, by estimation, is 55 to 60%. The left ventricle has normal function. The left ventricle has no  regional wall motion abnormalities. Left ventricular diastolic parameters are consistent with Grade I diastolic dysfunction (impaired relaxation).  2. Right ventricular systolic function is normal. The right ventricular size is normal. There is normal pulmonary artery systolic pressure.  3. Left atrial size was mildly dilated.  4. The mitral valve is normal in structure. Mild mitral valve regurgitation. Mild to moderate mitral stenosis. The mean mitral valve gradient is 6.0 mmHg. Severe mitral annular calcification.  5. The aortic valve is normal in structure. Aortic valve regurgitation is trivial. No aortic stenosis is present.  6. The inferior vena cava is normal in size with greater than 50% respiratory variability, suggesting right atrial pressure of 3 mmHg. FINDINGS  Left Ventricle: Left ventricular ejection fraction, by estimation, is 55 to 60%. The left ventricle has normal function. The left ventricle has no regional wall motion abnormalities. The left ventricular internal cavity size was normal in size. There is  no left ventricular hypertrophy. Left ventricular diastolic parameters are consistent with Grade I diastolic dysfunction (impaired relaxation). Right Ventricle: The right ventricular size is normal. No increase in right ventricular wall thickness. Right ventricular systolic function is normal. There is normal pulmonary artery systolic pressure. The tricuspid regurgitant velocity is 2.31 m/s, and  with an assumed right atrial pressure of 3 mmHg, the estimated right ventricular systolic pressure is 24.3 mmHg. Left Atrium: Left atrial size was mildly dilated. Right Atrium: Right atrial size was normal in size. Pericardium: There is no evidence of pericardial effusion. Mitral Valve: The mitral valve is normal in structure. There is moderate thickening of the mitral valve leaflet(s). There is moderate calcification of the mitral valve leaflet(s). Severe mitral annular calcification. Mild mitral valve  regurgitation. Mild  to moderate mitral valve stenosis. The mean mitral valve gradient is 6.0 mmHg. Tricuspid Valve: The tricuspid valve is normal in structure. Tricuspid valve regurgitation is mild . No evidence of tricuspid stenosis. Aortic Valve: The aortic valve is normal in structure. Aortic valve regurgitation is trivial. Aortic regurgitation PHT measures 600 msec. No aortic stenosis is present. Aortic valve mean gradient measures 7.0 mmHg. Aortic valve peak gradient measures 12.7 mmHg. Aortic valve area, by VTI measures 1.18 cm. Pulmonic Valve: The pulmonic valve was normal in structure. Pulmonic valve regurgitation is not visualized. No evidence of pulmonic stenosis. Aorta: The aortic root is normal in size and structure. Venous: The inferior vena cava is normal in size with greater than 50% respiratory variability, suggesting right atrial pressure of 3 mmHg. IAS/Shunts: No atrial level shunt detected by color flow Doppler.  LEFT VENTRICLE PLAX 2D LVIDd:         4.30 cm   Diastology LVIDs:  3.10 cm   LV e' medial:    4.56 cm/s LV PW:         1.00 cm   LV E/e' medial:  24.6 LV IVS:        0.80 cm   LV e' lateral:   4.87 cm/s LVOT diam:     1.40 cm   LV E/e' lateral: 23.0 LV SV:         43 LV SV Index:   26 LVOT Area:     1.54 cm  RIGHT VENTRICLE RV Basal diam:  2.60 cm RV S prime:     11.30 cm/s TAPSE (M-mode): 1.7 cm LEFT ATRIUM             Index        RIGHT ATRIUM          Index LA diam:        4.40 cm 2.67 cm/m   RA Area:     7.81 cm LA Vol (A2C):   50.9 ml 30.84 ml/m  RA Volume:   13.80 ml 8.36 ml/m LA Vol (A4C):   42.4 ml 25.69 ml/m LA Biplane Vol: 49.3 ml 29.87 ml/m  AORTIC VALVE                     PULMONIC VALVE AV Area (Vmax):    1.37 cm      PV Vmax:       1.06 m/s AV Area (Vmean):   1.26 cm      PV Peak grad:  4.5 mmHg AV Area (VTI):     1.18 cm AV Vmax:           178.00 cm/s AV Vmean:          116.000 cm/s AV VTI:            0.369 m AV Peak Grad:      12.7 mmHg AV Mean Grad:       7.0 mmHg LVOT Vmax:         158.00 cm/s LVOT Vmean:        94.800 cm/s LVOT VTI:          0.282 m LVOT/AV VTI ratio: 0.76 AI PHT:            600 msec  AORTA Ao Root diam: 2.80 cm Ao Asc diam:  3.60 cm MITRAL VALVE                TRICUSPID VALVE MV Area (PHT): 2.07 cm     TR Peak grad:   21.3 mmHg MV Mean grad:  6.0 mmHg     TR Vmax:        231.00 cm/s MV Decel Time: 366 msec MR Peak grad: 11.8 mmHg     SHUNTS MR Mean grad: 6.0 mmHg      Systemic VTI:  0.28 m MR Vmax:      172.00 cm/s   Systemic Diam: 1.40 cm MR Vmean:     116.0 cm/s MV E velocity: 112.00 cm/s MV A velocity: 167.00 cm/s MV E/A ratio:  0.67 Lorine Bears MD Electronically signed by Lorine Bears MD Signature Date/Time: 04/10/2023/5:17:03 PM    Final    DG Chest Port 1 View  Result Date: 04/10/2023 CLINICAL DATA:  142230 Pleural effusion 142230. EXAM: PORTABLE CHEST 1 VIEW COMPARISON:  Chest radiograph 04/09/2023. FINDINGS: Decreased, trace residual right pleural effusion. Increasing airspace opacity in the medial aspect of the right lung base, suspicious for  aspiration or developing infection. No pneumothorax. IMPRESSION: Decreased, trace residual right pleural effusion. Increasing airspace opacity in the medial aspect of the right lung base, suspicious for aspiration or developing infection. Electronically Signed   By: Orvan Falconer M.D.   On: 04/10/2023 12:47   DG Chest Port 1 View  Result Date: 04/09/2023 CLINICAL DATA:  Post thoracentesis EXAM: PORTABLE CHEST 1 VIEW COMPARISON:  CT chest 04/07/2023, chest x-ray 05/22/2022 FINDINGS: Left-sided electronic recording device. Normal cardiac size. Mitral annular calcifications. Left lung grossly clear. Decreased right pleural effusion with small moderate residual pleural effusion. No convincing right pneumothorax. Airspace disease at the right base. Aortic atherosclerosis. Old appearing left-sided rib fractures. Left shoulder replacement IMPRESSION: Decreased right pleural effusion with  small to moderate residual pleural effusion. No convincing pneumothorax. Airspace disease at the right base, atelectasis versus pneumonia. Electronically Signed   By: Jasmine Pang M.D.   On: 04/09/2023 16:27   US Abdomen Limited RUQ (LIVER/GB)  Result Date: 04/09/2023 CLINICAL DATA:  Cirrhosis EXAM: ULTRASOUND ABDOMEN LIMITED RIGHT UPPER QUADRANT COMPARISON:  None Available. FINDINGS: Gallbladder: Prior cholecystectomy. Common bile duct: Diameter: 6 mm. Liver: No focal lesion identified. Nodular liver contour. Normal parenchymal echogenicity. Portal vein is patent on color Doppler imaging with normal direction of blood flow towards the liver. Other: Right pleural effusion. IMPRESSION: 1. Cirrhotic liver morphology. 2. Right pleural effusion. Electronically Signed   By: Allegra Lai M.D.   On: 04/09/2023 12:59   DG Foot Complete Right  Result Date: 04/08/2023 CLINICAL DATA:  Right foot pain EXAM: RIGHT FOOT COMPLETE - 3+ VIEW COMPARISON:  None Available. FINDINGS: No fracture or dislocation is seen. Mild degenerative changes of the 1st MCP joint. Degenerative changes of the dorsal midfoot. Moderate posterior and plantar enthesophytes. IMPRESSION: No fracture or dislocation is seen. Degenerative changes, as above. Electronically Signed   By: Charline Bills M.D.   On: 04/08/2023 01:55   CT CHEST WO CONTRAST  Result Date: 04/07/2023 CLINICAL DATA:  Chest pain, shortness of breath EXAM: CT CHEST WITHOUT CONTRAST TECHNIQUE: Multidetector CT imaging of the chest was performed following the standard protocol without IV contrast. RADIATION DOSE REDUCTION: This exam was performed according to the departmental dose-optimization program which includes automated exposure control, adjustment of the mA and/or kV according to patient size and/or use of iterative reconstruction technique. COMPARISON:  CT 05/23/2022 FINDINGS: Cardiovascular: Heart size is upper limits of normal. No pericardial effusion. Thoracic  aorta is nonaneurysmal. There are atherosclerotic vascular calcifications of the aorta and coronary arteries. Calcification of the mitral annulus. Central pulmonary vasculature is nondilated. Mediastinum/Nodes: No enlarged mediastinal or axillary lymph nodes. Thyroid gland, trachea, and esophagus demonstrate no significant findings. Small hiatal hernia. Lungs/Pleura: Moderate-large layering right-sided pleural effusion with complete atelectasis of the right lower lobe and near-complete atelectasis of the right middle lobe. Partial compressive atelectasis of the right upper lobe. Mild mosaic attenuation of the aerated portion of the right upper lobe and within the left lung. No left-sided pleural effusion. No pneumothorax. Upper Abdomen: Nodular hepatic surface contour. No acute abnormality. Musculoskeletal: No chest wall mass or suspicious bone lesions identified. Left chest wall loop recorder. IMPRESSION: 1. Moderate-large layering right-sided pleural effusion with complete atelectasis of the right lower lobe and near-complete atelectasis of the right middle lobe. Partial compressive atelectasis of the right upper lobe. 2. Mild mosaic attenuation of the aerated portion of the right upper lobe and within the left lung, which may be related to small airways disease. 3. Nodular hepatic surface  contour, suggestive of cirrhosis. 4. Aortic and coronary artery atherosclerosis (ICD10-I70.0). Electronically Signed   By: Duanne Guess D.O.   On: 04/07/2023 15:25    Labs: Basic Metabolic Panel: Recent Labs  Lab 04/13/23 1213 04/14/23 0627 04/15/23 0415 04/16/23 0448 04/17/23 0423  NA 132* 132* 136 136 135  K 3.8 4.4 3.8 3.8 4.0  CL 94* 95* 103 106 103  CO2 26 26 24 26 25   GLUCOSE 153* 199* 109* 114* 98  BUN 24* 34* 19 19 16   CREATININE 0.82 1.02* 0.75 0.80 0.62  CALCIUM 8.8* 8.8* 8.2* 8.2* 8.4*  MG  --   --   --  1.9  --    CBC: Recent Labs  Lab 04/13/23 1213 04/14/23 0627 04/15/23 0415  04/16/23 0448 04/17/23 0423  WBC 10.3 12.3* 7.6 6.8 7.0  NEUTROABS 7.7 10.2*  --   --   --   HGB 13.1 12.8 10.8* 11.0* 11.1*  HCT 39.6 38.7 32.1* 33.6* 33.5*  MCV 92.3 92.8 90.4 93.3 91.5  PLT 181 156 139* 134* 137*   Microbiology: Results for orders placed or performed during the hospital encounter of 04/07/23  Culture, blood (Routine X 2) w Reflex to ID Panel     Status: None   Collection Time: 04/07/23 11:13 PM   Specimen: BLOOD RIGHT ARM  Result Value Ref Range Status   Specimen Description BLOOD RIGHT ARM  Final   Special Requests   Final    BOTTLES DRAWN AEROBIC AND ANAEROBIC Blood Culture adequate volume   Culture   Final    NO GROWTH 5 DAYS Performed at [facility], [address] [place], [place] [address]    Report Status 04/12/2023 FINAL  Final  Culture, blood (Routine X 2) w Reflex to ID Panel     Status: None   Collection Time: 04/07/23 11:14 PM   Specimen: BLOOD RIGHT HAND  Result Value Ref Range Status   Specimen Description BLOOD RIGHT HAND  Final   Special Requests   Final    BOTTLES DRAWN AEROBIC AND ANAEROBIC Blood Culture adequate volume   Culture   Final    NO GROWTH 5 DAYS Performed at [facility], [address], [place], [place] [address]    Report Status 04/12/2023 FINAL  Final  Pleural fluid culture w Gram Stain     Status: None   Collection Time: 04/09/23  3:30 PM   Specimen: Pleural Fluid  Result Value Ref Range Status   Specimen Description   Final    PLEURAL Performed at [facility], [address], [place], [place] [address]    Special Requests   Final    NONE Performed at [facility], [address] [place], [place] [address]    Gram Stain   Final    RARE WBC PRESENT, PREDOMINANTLY PMN NO ORGANISMS SEEN    Culture   Final    NO GROWTH 3 DAYS Performed at [facility], [address], [place], [place] [address]    Report Status 04/13/2023 FINAL  Final  Acid Fast Smear (AFB)      Status: None   Collection Time: 04/09/23  3:30 PM   Specimen: Pleural, Right; Pleural Fluid  Result Value Ref Range Status   AFB Specimen Processing Concentration  Final   Acid Fast Smear Negative  Final    Comment: (NOTE) Performed At: [facility] [address] [place], [place] [id number] Jolene Schimke MD FI:[id number]  Source (AFB) PLEURAL  Final    Comment: Performed at [facility], [address] [place], [place] [address]  Culture, fungus without smear     Status: None (Preliminary result)   Collection Time: 04/09/23  3:30 PM   Specimen: Pleural Fluid  Result Value Ref Range Status   Specimen Description   Final    PLEURAL Performed at [facility], [address] [place], [place] [address]    Special Requests   Final    NONE Performed at [facility], [address] [place], [place] [address]    Culture   Final    NO GROWTH 4 DAYS Performed at [facility], [address] [place], [place] [address]    Report Status PENDING  Incomplete   Time coordinating discharge: Over 30 minutes  Leeroy Bock, MD  [facility] 04/17/2023, 10:58 AM

## 2023-04-17 NOTE — Progress Notes (Incomplete)
Progress Note Patient: [name] VFI:[id number] DOB: [birth year] DOA: 04/14/2023     3 DOS: the patient was seen and examined on 04/17/2023  Brief hospital course: [name] is a 77 y.o. female with medical history significant of a fib and DVT on Eliquis, HTN, diet-controlled diabetes, COPD on 2 L oxygen at night, hypothyroidism, GERD, depression, anemia, CKD-3A presenting with recurrent falls, lumbosacral fracture.  Patient currently lives at home alone.  Noted to have been recently admitted November 1 through November 6 for right-sided pleural effusion with concern for malignancy. Recently diagnosed with lymphoma.   Pt was found to have an L4 compression fracture and possible S5 sacrococcygeal fracture on CT imaging.   Neurosurgery was consulted and recommend conservative management with TLSO brace for L4 compression fracture for comfort.  Sacral fracture non-surgical.  Stable for discharge with HH PT and OT once pain management is tolerable.   Assessment and Plan:  Fall --Fall precautions --PT OT evaluation- recommending HH  Lumbar compression fracture (HCC) L4 compression fracture  S5 fracture Both injuries being managed conservatively --Neurosurgery consulted --TLSO brace for comfort --Pain control & bowel regimen --PT OT evaluations --Padded seating - ordered hospital bed --Follow up with Neurosurgery outpatient for upright x-rays  Type II diabetes mellitus with renal manifestations (HCC) Sliding scale Novolog Titrate regimen  Anxiety- previously treated with xanax. She has significant anxiety and grief  with new lymphoma dx and acute pain/illness.  - PRN xanax restarted  HTN (hypertension) BP's are soft. On opioid pain meds Holding home enalapril, Lasix, metoprolol. Resume meds gradually as BP allows.  Chronic kidney disease, stage 3a (HCC) Stable. Monitor BMP.  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 S/p  thoracentesis 04/09/23 Pleural fluid analysis (+)leukocytosis monocyte predominant, and significant elevation in LDH. DDx infectious etiologies, malignancy --Following by Dr. Cathie Hoops w/ oncology w/ concern for lymphoma   Hypothyroidism Continue Synthroid  COPD (chronic obstructive pulmonary disease) (HCC) Stable. Continue home regimen  Atrial fibrillation, chronic (HCC) --Continue amiodarone and metoprolol --Cont Eliquis at half dose per patients preference after shared-decision making discussion on frequent falls and clot risk.  --Cardiology consulted on admission to  to discuss risk vs benefits of anticoagulation given multiple falls.  DVT and concern for malignancy w/ lymphoma.   H/o DVT (deep venous thrombosis) (HCC) On Eliquis  Subjective: Pt reports significant anxiety about hospital bed at home. Her pain is poorly controlled. She is anxious about her many medical problems.   Physical Exam: Vitals:   04/16/23 0718 04/16/23 1548 04/16/23 1746 04/16/23 2037  BP: 118/74 (!) 118/58  132/68  Pulse: (!) 59 67  66  Resp: 16 16  16   Temp: 97.7 F (36.5 C) 97.8 F (36.6 C)  98.8 F (37.1 C)  TempSrc:  Oral  Oral  SpO2: 98% 96% 90% 95%  Weight:      Height:       General exam: awake, alert, in acute distress HEENT: moist mucus membranes, hearing grossly normal  Respiratory system: CTAB but generally diminished, no expiratory wheezes, normal respiratory effort. Cardiovascular system: normal S1/S2, RRR, no pedal edema.   Central nervous system: A&O x3. no gross focal neurologic deficits, normal speech Extremities: moves all, no edema, normal tone. In TSLO brace Skin: dry, intact, normal temperature Psychiatry: anxious mood, congruent affect, judgement and insight appear normal  Data Reviewed:    Latest Ref Rng & Units 04/17/2023    4:23 AM 04/16/2023    4:48 AM 04/15/2023  4:15 AM  CBC  WBC 4.0 - 10.5 K/uL 7.0  6.8  7.6   Hemoglobin 12.0 - 15.0 g/dL 78.4  69.6  29.5    Hematocrit 36.0 - 46.0 % 33.5  33.6  32.1   Platelets 150 - 400 K/uL 137  134  139       Latest Ref Rng & Units 04/17/2023    4:23 AM 04/16/2023    4:48 AM 04/15/2023    4:15 AM  BMP  Glucose 70 - 99 mg/dL 98  284  132   BUN 8 - 23 mg/dL 16  19  19    Creatinine 0.44 - 1.00 mg/dL 4.40  1.02  7.25   Sodium 135 - 145 mmol/L 135  136  136   Potassium 3.5 - 5.1 mmol/L 4.0  3.8  3.8   Chloride 98 - 111 mmol/L 103  106  103   CO2 22 - 32 mmol/L 25  26  24    Calcium 8.9 - 10.3 mg/dL 8.4  8.2  8.2    Family Communication: son and daughter in law at bedside.   Disposition: Status is: Inpatient Remains inpatient appropriate because: Ongoing evaluation   Planned Discharge Destination: [facility] tomorrow  Time spent: 42 minutes  Author: Leeroy Bock, MD 04/17/2023 7:17 AM  For on call review [email or web address].

## 2023-04-17 NOTE — Progress Notes (Signed)
Physical Therapy Treatment Patient Details Name: [name] MRN: [id number] DOB: [birth year] Today's Date: 04/17/2023   History of Present Illness 77 yo female brought to ED from home s/p fall on spilt juice with inability to get up until 3am when able to call 911 with severe pain to back and pelvis and R posterior head hematoma. CT findings: No acute traumatic injury to c spine, chest,  abdomen, or pelvis, minimally displaced acute fx of S5, mild L4 superior endplate compression fx is age indeterminate, multilevel spinal stenosis mod to severe at L4-L5 and mild to mod at L2-L3 and L3-L4, Chronic mild/mod degenerative spinal stenosis at T10-T11 and mod to large layering R PE has slightly decreased compared to 04/07/23.    PT Comments  Pt sitting EOB before and after session with back brace donned.  Stated she feels less pain with brace on even at rest in supine.  She is able to stand from EOB with use of handrail on second attempt.  Some imbalance noted but seems to be pain related as she flinches upon attempt to stand.  She is able to walk x 4 laps on unit with RW and cga x 1.  Slow gait with occasional self initiated rest breaks in standing for fatigue and pain.  Plan is to have hospital bed delivered today which will help pt with overall bed mobility and comfort then transition home with PACE program.     If plan is discharge home, recommend the following: A little help with bathing/dressing/bathroom;Assistance with cooking/housework;A little help with walking and/or transfers;Assist for transportation   Can travel by private vehicle        Equipment Recommendations  Hospital bed    Recommendations for Other Services       Precautions / Restrictions Precautions Precautions: Fall Restrictions Weight Bearing Restrictions: No     Mobility  Bed Mobility               General bed mobility comments: sitting EOB before and after session Patient Response:  Cooperative  Transfers Overall transfer level: Needs assistance Equipment used: Rolling walker (2 wheels) Transfers: Sit to/from Stand Sit to Stand: Contact guard assist, Min assist           General transfer comment: she does stand on her own with use of rails.  struggles some with x 2 attemps but only needs CGA for safety due to pain    Ambulation/Gait Ambulation/Gait assistance: Contact guard assist Gait Distance (Feet): 640 Feet Assistive device: Rolling walker (2 wheels) Gait Pattern/deviations: Step-through pattern Gait velocity: decreased     General Gait Details: x 4 laps on unit   Stairs             Wheelchair Mobility     Tilt Bed Tilt Bed Patient Response: Cooperative  Modified Rankin (Stroke Patients Only)       Balance Overall balance assessment: Needs assistance, History of Falls Sitting-balance support: Feet supported, No upper extremity supported Sitting balance-Leahy Scale: Good     Standing balance support: Bilateral upper extremity supported, During functional activity Standing balance-Leahy Scale: Fair                              Cognition Arousal: Alert Behavior During Therapy: WFL for tasks assessed/performed Overall Cognitive Status: Within Functional Limits for tasks assessed  Exercises      General Comments        Pertinent Vitals/Pain Pain Assessment Pain Assessment: Faces Faces Pain Scale: Hurts even more Pain Location: back/tailbone but reports it is improved with wearing brace even in bed. Pain Descriptors / Indicators: Aching, Sore, Grimacing Pain Intervention(s): Limited activity within patient's tolerance, Monitored during session, Repositioned    Home Living                          Prior Function            PT Goals (current goals can now be found in the care plan section) Progress towards PT goals: Progressing toward  goals    Frequency    Min 1X/week      PT Plan      Co-evaluation              AM-PAC PT "6 Clicks" Mobility   Outcome Measure  Help needed turning from your back to your side while in a flat bed without using bedrails?: A Little Help needed moving from lying on your back to sitting on the side of a flat bed without using bedrails?: A Little Help needed moving to and from a bed to a chair (including a wheelchair)?: A Little Help needed standing up from a chair using your arms (e.g., wheelchair or bedside chair)?: A Little Help needed to walk in hospital room?: A Little Help needed climbing 3-5 steps with a railing? : A Little 6 Click Score: 18    End of Session Equipment Utilized During Treatment: Gait belt Activity Tolerance: Patient tolerated treatment well Patient left: in bed;with call bell/phone within reach;with bed alarm set Nurse Communication: Mobility status PT Visit Diagnosis: Muscle weakness (generalized) (M62.81);Unsteadiness on feet (R26.81)     Time: 8841-6606 PT Time Calculation (min) (ACUTE ONLY): 16 min  Charges:    $Gait Training: 8-22 mins PT General Charges $$ ACUTE PT VISIT: 1 Visit                   Danielle Dess, PTA 04/17/23, 9:25 AM

## 2023-04-17 NOTE — TOC Transition Note (Signed)
Transition of Care [facility]) - CM/SW Discharge Note   Patient Details  Name: [name] MRN: [id number] Date of Birth: Jun 29, 1945  Transition of Care [facility]) CM/SW Contact:  Garret Reddish, RN Phone Number: 04/17/2023, 2:12 PM   Clinical Narrative:     Chart reviewed.  Noted that patient has orders for discharge today.    I have spoken with Robin with [facility].  She informs me that she has arranged for patient to have a hospital bed at home.  I have informed Zella Ball that patient will need home 02 on discharge.  Zella Ball will also arrange home 02 for home use.  I have made Robin aware that patient will need PT/OT on discharge.  Zella Ball will arranged these services also.    Zella Ball reports that the [facility] transport Zenaida Niece will transport [facility] to [facility] today and then back to her home.   Patient made aware of the above information.    I have informed staff nurse of the above information.   Final next level of care: Other (comment) (DC home with services of [facility].  [facility] to arranged Therapy PT/OT at their adult center, home 02 and hospital bed.) Barriers to Discharge: No Barriers Identified   Patient Goals and CMS Choice CMS Medicare.gov Compare Post Acute Care list provided to:: Patient Choice offered to / list presented to : Patient  Discharge Placement                  Patient to be transferred to facility by: [facility]   Patient and family notified of of transfer: 04/17/23  Discharge Plan and Services Additional resources added to the After Visit Summary for                  DME Arranged:  [facility] bed and home 02 to be arranged via [facility])   Date DME Agency Contacted: 04/17/23   Representative spoke with at DME Agency: Zella Ball with [facility] Arranged: PT, OT ([facility] to arrange)   Date [facility] Contacted: 04/17/23      Social Determinants of Health (SDOH) Interventions SDOH Screenings   Food Insecurity: No Food Insecurity (04/14/2023)  Housing: Low Risk  (04/14/2023)  Transportation Needs:  No Transportation Needs (04/14/2023)  Utilities: Not At Risk (04/14/2023)  Depression (PHQ2-9): Low Risk  (04/13/2023)  Tobacco Use: Low Risk  (04/13/2023)     Readmission Risk Interventions    04/10/2023    1:51 PM  Readmission Risk Prevention Plan  Transportation Screening Complete  PCP or Specialist Appt within 3-5 Days Complete  Palliative Care Screening Complete  Medication Review (RN Care Manager) Complete

## 2023-04-18 NOTE — Telephone Encounter (Signed)
Called and spoke to Kia at [facility] and informed her off all appt details

## 2023-04-18 NOTE — Telephone Encounter (Signed)
Patient has been scheduled for Bm Bx on 11/20. Please schedule MD on 11/27 @2 :15. Spoke to pt's son [name] and he is aware of appt details.

## 2023-04-19 ENCOUNTER — Inpatient Hospital Stay
Admission: RE | Admit: 2023-04-19 | Discharge: 2023-04-19 | Disposition: A | Discharge status: Home / Self Care | Payer: Medicare (Managed Care) | Source: Ambulatory Visit | Attending: Oncology | Admitting: Oncology

## 2023-04-19 DIAGNOSIS — D72820 Lymphocytosis (symptomatic): Secondary | ICD-10-CM | POA: Insufficient documentation

## 2023-04-19 DIAGNOSIS — R634 Abnormal weight loss: Secondary | ICD-10-CM | POA: Insufficient documentation

## 2023-04-19 DIAGNOSIS — J90 Pleural effusion, not elsewhere classified: Secondary | ICD-10-CM | POA: Insufficient documentation

## 2023-04-19 DIAGNOSIS — K7460 Unspecified cirrhosis of liver: Secondary | ICD-10-CM | POA: Insufficient documentation

## 2023-04-19 DIAGNOSIS — C8512 Unspecified B-cell lymphoma, intrathoracic lymph nodes: Secondary | ICD-10-CM | POA: Diagnosis not present

## 2023-04-19 DIAGNOSIS — C8510 Unspecified B-cell lymphoma, unspecified site: Secondary | ICD-10-CM | POA: Insufficient documentation

## 2023-04-19 DIAGNOSIS — R0602 Shortness of breath: Secondary | ICD-10-CM | POA: Diagnosis not present

## 2023-04-19 LAB — GLUCOSE, CAPILLARY: Glucose-Capillary: 119 mg/dL — ABNORMAL HIGH (ref 70–99)

## 2023-04-19 MED ORDER — FLUDEOXYGLUCOSE F - 18 (FDG) INJECTION
7.2000 | Freq: Once | INTRAVENOUS | Status: AC | PRN
  Administered 2023-04-19: 6.99 via INTRAVENOUS

## 2023-04-20 ENCOUNTER — Other Ambulatory Visit: Payer: Self-pay

## 2023-04-20 ENCOUNTER — Emergency Department: Payer: Medicare (Managed Care)

## 2023-04-20 DIAGNOSIS — K7460 Unspecified cirrhosis of liver: Secondary | ICD-10-CM | POA: Diagnosis present

## 2023-04-20 DIAGNOSIS — J9621 Acute and chronic respiratory failure with hypoxia: Secondary | ICD-10-CM | POA: Diagnosis present

## 2023-04-20 DIAGNOSIS — K219 Gastro-esophageal reflux disease without esophagitis: Secondary | ICD-10-CM | POA: Diagnosis present

## 2023-04-20 DIAGNOSIS — Z7901 Long term (current) use of anticoagulants: Secondary | ICD-10-CM

## 2023-04-20 DIAGNOSIS — I480 Paroxysmal atrial fibrillation: Secondary | ICD-10-CM | POA: Diagnosis present

## 2023-04-20 DIAGNOSIS — Z7951 Long term (current) use of inhaled steroids: Secondary | ICD-10-CM

## 2023-04-20 DIAGNOSIS — Z96612 Presence of left artificial shoulder joint: Secondary | ICD-10-CM | POA: Diagnosis present

## 2023-04-20 DIAGNOSIS — D6959 Other secondary thrombocytopenia: Secondary | ICD-10-CM | POA: Diagnosis present

## 2023-04-20 DIAGNOSIS — J189 Pneumonia, unspecified organism: Secondary | ICD-10-CM | POA: Diagnosis present

## 2023-04-20 DIAGNOSIS — Z86718 Personal history of other venous thrombosis and embolism: Secondary | ICD-10-CM

## 2023-04-20 DIAGNOSIS — D72820 Lymphocytosis (symptomatic): Secondary | ICD-10-CM | POA: Diagnosis present

## 2023-04-20 DIAGNOSIS — C8512 Unspecified B-cell lymphoma, intrathoracic lymph nodes: Principal | ICD-10-CM | POA: Diagnosis present

## 2023-04-20 DIAGNOSIS — Z885 Allergy status to narcotic agent status: Secondary | ICD-10-CM

## 2023-04-20 DIAGNOSIS — J918 Pleural effusion in other conditions classified elsewhere: Secondary | ICD-10-CM | POA: Diagnosis present

## 2023-04-20 DIAGNOSIS — J440 Chronic obstructive pulmonary disease with acute lower respiratory infection: Secondary | ICD-10-CM | POA: Diagnosis present

## 2023-04-20 DIAGNOSIS — Z66 Do not resuscitate: Secondary | ICD-10-CM | POA: Diagnosis present

## 2023-04-20 DIAGNOSIS — Z841 Family history of disorders of kidney and ureter: Secondary | ICD-10-CM

## 2023-04-20 DIAGNOSIS — E890 Postprocedural hypothyroidism: Secondary | ICD-10-CM | POA: Diagnosis present

## 2023-04-20 DIAGNOSIS — I129 Hypertensive chronic kidney disease with stage 1 through stage 4 chronic kidney disease, or unspecified chronic kidney disease: Secondary | ICD-10-CM | POA: Diagnosis present

## 2023-04-20 DIAGNOSIS — J441 Chronic obstructive pulmonary disease with (acute) exacerbation: Secondary | ICD-10-CM | POA: Diagnosis present

## 2023-04-20 DIAGNOSIS — Z79899 Other long term (current) drug therapy: Secondary | ICD-10-CM

## 2023-04-20 DIAGNOSIS — F419 Anxiety disorder, unspecified: Secondary | ICD-10-CM | POA: Diagnosis present

## 2023-04-20 DIAGNOSIS — N182 Chronic kidney disease, stage 2 (mild): Secondary | ICD-10-CM | POA: Diagnosis present

## 2023-04-20 DIAGNOSIS — Z9049 Acquired absence of other specified parts of digestive tract: Secondary | ICD-10-CM

## 2023-04-20 DIAGNOSIS — E1122 Type 2 diabetes mellitus with diabetic chronic kidney disease: Secondary | ICD-10-CM | POA: Diagnosis present

## 2023-04-20 DIAGNOSIS — G4700 Insomnia, unspecified: Secondary | ICD-10-CM | POA: Diagnosis present

## 2023-04-20 DIAGNOSIS — Z79890 Hormone replacement therapy (postmenopausal): Secondary | ICD-10-CM

## 2023-04-20 DIAGNOSIS — Z881 Allergy status to other antibiotic agents status: Secondary | ICD-10-CM

## 2023-04-20 DIAGNOSIS — Z831 Family history of other infectious and parasitic diseases: Secondary | ICD-10-CM

## 2023-04-20 DIAGNOSIS — Z96651 Presence of right artificial knee joint: Secondary | ICD-10-CM | POA: Diagnosis present

## 2023-04-20 DIAGNOSIS — I4510 Unspecified right bundle-branch block: Secondary | ICD-10-CM | POA: Diagnosis present

## 2023-04-20 DIAGNOSIS — Z91041 Radiographic dye allergy status: Secondary | ICD-10-CM

## 2023-04-20 DIAGNOSIS — E871 Hypo-osmolality and hyponatremia: Secondary | ICD-10-CM | POA: Diagnosis present

## 2023-04-20 DIAGNOSIS — E878 Other disorders of electrolyte and fluid balance, not elsewhere classified: Secondary | ICD-10-CM | POA: Diagnosis present

## 2023-04-20 DIAGNOSIS — Z9981 Dependence on supplemental oxygen: Secondary | ICD-10-CM

## 2023-04-20 LAB — CBC WITH DIFFERENTIAL/PLATELET
Abs Immature Granulocytes: 0.04 10*3/uL (ref 0.00–0.07)
Basophils Absolute: 0.1 10*3/uL (ref 0.0–0.1)
Basophils Relative: 1 %
Eosinophils Absolute: 0.2 10*3/uL (ref 0.0–0.5)
Eosinophils Relative: 2 %
HCT: 38.5 % (ref 36.0–46.0)
Hemoglobin: 12.4 g/dL (ref 12.0–15.0)
Immature Granulocytes: 0 %
Lymphocytes Relative: 19 %
Lymphs Abs: 1.9 10*3/uL (ref 0.7–4.0)
MCH: 30.2 pg (ref 26.0–34.0)
MCHC: 32.2 g/dL (ref 30.0–36.0)
MCV: 93.7 fL (ref 80.0–100.0)
Monocytes Absolute: 0.8 10*3/uL (ref 0.1–1.0)
Monocytes Relative: 8 %
Neutro Abs: 7.2 10*3/uL (ref 1.7–7.7)
Neutrophils Relative %: 70 %
Platelets: 169 10*3/uL (ref 150–400)
RBC: 4.11 MIL/uL (ref 3.87–5.11)
RDW: 13.5 % (ref 11.5–15.5)
WBC: 10.2 10*3/uL (ref 4.0–10.5)
nRBC: 0 % (ref 0.0–0.2)

## 2023-04-20 LAB — COMPREHENSIVE METABOLIC PANEL
ALT: 12 U/L (ref 0–44)
AST: 20 U/L (ref 15–41)
Albumin: 3.4 g/dL — ABNORMAL LOW (ref 3.5–5.0)
Alkaline Phosphatase: 42 U/L (ref 38–126)
Anion gap: 9 (ref 5–15)
BUN: 19 mg/dL (ref 8–23)
CO2: 27 mmol/L (ref 22–32)
Calcium: 8.3 mg/dL — ABNORMAL LOW (ref 8.9–10.3)
Chloride: 96 mmol/L — ABNORMAL LOW (ref 98–111)
Creatinine, Ser: 0.9 mg/dL (ref 0.44–1.00)
GFR, Estimated: >60 mL/min (ref >60)
Glucose, Bld: 121 mg/dL — ABNORMAL HIGH (ref 70–99)
Potassium: 4.2 mmol/L (ref 3.5–5.1)
Sodium: 132 mmol/L — ABNORMAL LOW (ref 135–145)
Total Bilirubin: 0.5 mg/dL (ref <1.2)
Total Protein: 7.1 g/dL (ref 6.5–8.1)

## 2023-04-20 LAB — TROPONIN I (HIGH SENSITIVITY): Troponin I (High Sensitivity): 7 ng/L (ref <18)

## 2023-04-20 NOTE — ED Triage Notes (Signed)
BIB [facility] from home. Recent hx of R pleural effusion 2 wks ago. Pt has increased SOB today and was unable to complete PT today. Also reports R sided chest pain. CXR done outpatient and was told she had "build up" on that side. Lungs clear to EMS. Pt on 3L [place] on arrival and pt has been using this for the last 2 wks. Pt also has 2 lumbar and sacral fx. Pt alert and oriented on arrival. Pt speaking in full sentences.   EMS VS: 148/74 HR 65 98.3 temp 97% 3L [place]

## 2023-04-21 ENCOUNTER — Inpatient Hospital Stay
Admission: EM | Admit: 2023-04-21 | Discharge: 2023-04-28 | DRG: 823 | Disposition: A | Discharge status: Skilled Nursing Facility | Payer: Medicare (Managed Care) | Attending: Hospitalist | Admitting: Hospitalist

## 2023-04-21 ENCOUNTER — Observation Stay: Payer: Medicare (Managed Care)

## 2023-04-21 ENCOUNTER — Encounter: Payer: Self-pay | Admitting: Family Medicine

## 2023-04-21 ENCOUNTER — Telehealth: Payer: Self-pay | Admitting: Neurosurgery

## 2023-04-21 DIAGNOSIS — E039 Hypothyroidism, unspecified: Secondary | ICD-10-CM | POA: Diagnosis not present

## 2023-04-21 DIAGNOSIS — Z515 Encounter for palliative care: Secondary | ICD-10-CM

## 2023-04-21 DIAGNOSIS — K7460 Unspecified cirrhosis of liver: Secondary | ICD-10-CM | POA: Diagnosis present

## 2023-04-21 DIAGNOSIS — N183 Chronic kidney disease, stage 3 unspecified: Secondary | ICD-10-CM | POA: Diagnosis not present

## 2023-04-21 DIAGNOSIS — J189 Pneumonia, unspecified organism: Secondary | ICD-10-CM | POA: Diagnosis present

## 2023-04-21 DIAGNOSIS — J441 Chronic obstructive pulmonary disease with (acute) exacerbation: Secondary | ICD-10-CM | POA: Diagnosis present

## 2023-04-21 DIAGNOSIS — E1122 Type 2 diabetes mellitus with diabetic chronic kidney disease: Secondary | ICD-10-CM

## 2023-04-21 DIAGNOSIS — J9621 Acute and chronic respiratory failure with hypoxia: Secondary | ICD-10-CM | POA: Insufficient documentation

## 2023-04-21 DIAGNOSIS — R0602 Shortness of breath: Secondary | ICD-10-CM | POA: Diagnosis present

## 2023-04-21 DIAGNOSIS — E878 Other disorders of electrolyte and fluid balance, not elsewhere classified: Secondary | ICD-10-CM | POA: Diagnosis present

## 2023-04-21 DIAGNOSIS — I480 Paroxysmal atrial fibrillation: Secondary | ICD-10-CM | POA: Insufficient documentation

## 2023-04-21 DIAGNOSIS — C8590 Non-Hodgkin lymphoma, unspecified, unspecified site: Secondary | ICD-10-CM | POA: Diagnosis not present

## 2023-04-21 DIAGNOSIS — Z9889 Other specified postprocedural states: Secondary | ICD-10-CM

## 2023-04-21 DIAGNOSIS — F419 Anxiety disorder, unspecified: Secondary | ICD-10-CM

## 2023-04-21 DIAGNOSIS — E890 Postprocedural hypothyroidism: Secondary | ICD-10-CM | POA: Diagnosis present

## 2023-04-21 DIAGNOSIS — Z9981 Dependence on supplemental oxygen: Secondary | ICD-10-CM | POA: Diagnosis not present

## 2023-04-21 DIAGNOSIS — Z96612 Presence of left artificial shoulder joint: Secondary | ICD-10-CM | POA: Diagnosis present

## 2023-04-21 DIAGNOSIS — J90 Pleural effusion, not elsewhere classified: Secondary | ICD-10-CM | POA: Diagnosis not present

## 2023-04-21 DIAGNOSIS — F39 Unspecified mood [affective] disorder: Secondary | ICD-10-CM

## 2023-04-21 DIAGNOSIS — I129 Hypertensive chronic kidney disease with stage 1 through stage 4 chronic kidney disease, or unspecified chronic kidney disease: Secondary | ICD-10-CM | POA: Diagnosis present

## 2023-04-21 DIAGNOSIS — J440 Chronic obstructive pulmonary disease with acute lower respiratory infection: Secondary | ICD-10-CM | POA: Diagnosis present

## 2023-04-21 DIAGNOSIS — C8512 Unspecified B-cell lymphoma, intrathoracic lymph nodes: Secondary | ICD-10-CM | POA: Diagnosis present

## 2023-04-21 DIAGNOSIS — K219 Gastro-esophageal reflux disease without esophagitis: Secondary | ICD-10-CM | POA: Insufficient documentation

## 2023-04-21 DIAGNOSIS — R0902 Hypoxemia: Secondary | ICD-10-CM

## 2023-04-21 DIAGNOSIS — Z7901 Long term (current) use of anticoagulants: Secondary | ICD-10-CM | POA: Diagnosis not present

## 2023-04-21 DIAGNOSIS — Z7189 Other specified counseling: Secondary | ICD-10-CM | POA: Diagnosis not present

## 2023-04-21 DIAGNOSIS — G4700 Insomnia, unspecified: Secondary | ICD-10-CM | POA: Diagnosis present

## 2023-04-21 DIAGNOSIS — D72820 Lymphocytosis (symptomatic): Secondary | ICD-10-CM | POA: Diagnosis present

## 2023-04-21 DIAGNOSIS — E871 Hypo-osmolality and hyponatremia: Secondary | ICD-10-CM | POA: Diagnosis present

## 2023-04-21 DIAGNOSIS — D6959 Other secondary thrombocytopenia: Secondary | ICD-10-CM | POA: Diagnosis present

## 2023-04-21 DIAGNOSIS — J918 Pleural effusion in other conditions classified elsewhere: Secondary | ICD-10-CM | POA: Diagnosis present

## 2023-04-21 DIAGNOSIS — Z66 Do not resuscitate: Secondary | ICD-10-CM | POA: Diagnosis present

## 2023-04-21 DIAGNOSIS — Z96651 Presence of right artificial knee joint: Secondary | ICD-10-CM | POA: Diagnosis present

## 2023-04-21 DIAGNOSIS — I4510 Unspecified right bundle-branch block: Secondary | ICD-10-CM | POA: Diagnosis present

## 2023-04-21 LAB — BODY FLUID CELL COUNT WITH DIFFERENTIAL
Eos, Fluid: 0 %
Lymphs, Fluid: 27 %
Monocyte-Macrophage-Serous Fluid: 48 %
Neutrophil Count, Fluid: 23 %
Other Cells, Fluid: 2 %
Total Nucleated Cell Count, Fluid: 22128 uL

## 2023-04-21 LAB — GLUCOSE, PLEURAL OR PERITONEAL FLUID: Glucose, Fluid: <20 mg/dL

## 2023-04-21 LAB — CBC
HCT: 33.3 % — ABNORMAL LOW (ref 36.0–46.0)
Hemoglobin: 11 g/dL — ABNORMAL LOW (ref 12.0–15.0)
MCH: 30.5 pg (ref 26.0–34.0)
MCHC: 33 g/dL (ref 30.0–36.0)
MCV: 92.2 fL (ref 80.0–100.0)
Platelets: 147 10*3/uL — ABNORMAL LOW (ref 150–400)
RBC: 3.61 MIL/uL — ABNORMAL LOW (ref 3.87–5.11)
RDW: 13.4 % (ref 11.5–15.5)
WBC: 10.1 10*3/uL (ref 4.0–10.5)
nRBC: 0 % (ref 0.0–0.2)

## 2023-04-21 LAB — BASIC METABOLIC PANEL
Anion gap: 10 (ref 5–15)
BUN: 16 mg/dL (ref 8–23)
CO2: 26 mmol/L (ref 22–32)
Calcium: 8.2 mg/dL — ABNORMAL LOW (ref 8.9–10.3)
Chloride: 98 mmol/L (ref 98–111)
Creatinine, Ser: 0.72 mg/dL (ref 0.44–1.00)
GFR, Estimated: >60 mL/min (ref >60)
Glucose, Bld: 128 mg/dL — ABNORMAL HIGH (ref 70–99)
Potassium: 4.3 mmol/L (ref 3.5–5.1)
Sodium: 134 mmol/L — ABNORMAL LOW (ref 135–145)

## 2023-04-21 LAB — LACTIC ACID, PLASMA: Lactic Acid, Venous: 1.3 mmol/L (ref 0.5–1.9)

## 2023-04-21 LAB — PROTEIN, PLEURAL OR PERITONEAL FLUID: Total protein, fluid: 4.4 g/dL

## 2023-04-21 LAB — LACTATE DEHYDROGENASE, PLEURAL OR PERITONEAL FLUID: LD, Fluid: >2500 U/L — ABNORMAL HIGH (ref 3–23)

## 2023-04-21 LAB — TROPONIN I (HIGH SENSITIVITY): Troponin I (High Sensitivity): 6 ng/L (ref <18)

## 2023-04-21 MED ORDER — ENSURE ENLIVE PO LIQD
237.0000 mL | Freq: Two times a day (BID) | ORAL | Status: DC
  Administered 2023-04-22 – 2023-04-24 (×4): 237 mL via ORAL

## 2023-04-21 MED ORDER — HYDROCODONE-ACETAMINOPHEN 5-325 MG PO TABS
1.0000 | ORAL_TABLET | Freq: Four times a day (QID) | ORAL | Status: DC | PRN
  Administered 2023-04-21: 2 via ORAL
  Administered 2023-04-21 – 2023-04-22 (×2): 1 via ORAL
  Administered 2023-04-22 – 2023-04-26 (×7): 2 via ORAL
  Filled 2023-04-21 (×4): qty 2
  Filled 2023-04-21: qty 1
  Filled 2023-04-21 (×7): qty 2

## 2023-04-21 MED ORDER — ALPRAZOLAM 0.25 MG PO TABS
0.2500 mg | ORAL_TABLET | Freq: Two times a day (BID) | ORAL | Status: DC
  Administered 2023-04-21 – 2023-04-28 (×15): 0.25 mg via ORAL
  Filled 2023-04-21 (×15): qty 1

## 2023-04-21 MED ORDER — HYDROCODONE-ACETAMINOPHEN 5-325 MG PO TABS: 1.0000 | ORAL_TABLET | Freq: Four times a day (QID) | ORAL | Status: DC | PRN

## 2023-04-21 MED ORDER — FENTANYL CITRATE PF 50 MCG/ML IJ SOSY
25.0000 ug | PREFILLED_SYRINGE | Freq: Once | INTRAMUSCULAR | Status: AC
  Administered 2023-04-21: 25 ug via INTRAVENOUS
  Filled 2023-04-21: qty 1

## 2023-04-21 MED ORDER — ONDANSETRON HCL 4 MG PO TABS: 4.0000 mg | ORAL_TABLET | Freq: Four times a day (QID) | ORAL | Status: DC | PRN

## 2023-04-21 MED ORDER — LEVOTHYROXINE SODIUM 125 MCG PO TABS
125.0000 ug | ORAL_TABLET | Freq: Every day | ORAL | Status: DC
  Administered 2023-04-21 – 2023-04-28 (×7): 125 ug via ORAL
  Filled 2023-04-21 (×8): qty 1

## 2023-04-21 MED ORDER — MORPHINE SULFATE (PF) 2 MG/ML IV SOLN
2.0000 mg | INTRAVENOUS | Status: DC | PRN
  Administered 2023-04-21 – 2023-04-27 (×3): 2 mg via INTRAVENOUS
  Filled 2023-04-21 (×4): qty 1

## 2023-04-21 MED ORDER — POLYVINYL ALCOHOL 1.4 % OP SOLN
2.0000 [drp] | Freq: Every day | OPHTHALMIC | Status: DC
  Administered 2023-04-22 – 2023-04-27 (×6): 2 [drp] via OPHTHALMIC
  Filled 2023-04-21 (×2): qty 15

## 2023-04-21 MED ORDER — ONDANSETRON HCL 4 MG/2ML IJ SOLN
4.0000 mg | Freq: Four times a day (QID) | INTRAMUSCULAR | Status: DC | PRN
  Administered 2023-04-21: 4 mg via INTRAVENOUS
  Filled 2023-04-21: qty 2

## 2023-04-21 MED ORDER — OMEGA-3-ACID ETHYL ESTERS 1 G PO CAPS
1.0000 | ORAL_CAPSULE | Freq: Every day | ORAL | Status: DC
  Administered 2023-04-22 – 2023-04-28 (×7): 1 g via ORAL
  Filled 2023-04-21 (×8): qty 1

## 2023-04-21 MED ORDER — SODIUM CHLORIDE 0.9 % IV SOLN: INTRAVENOUS | Status: AC

## 2023-04-21 MED ORDER — VITAMIN D-VITAMIN K 20-120 MCG/0.1ML PO LIQD: Freq: Every day | ORAL | Status: DC

## 2023-04-21 MED ORDER — SODIUM CHLORIDE 0.9 % IV SOLN
2.0000 g | INTRAVENOUS | Status: AC
  Administered 2023-04-21 – 2023-04-25 (×5): 2 g via INTRAVENOUS
  Filled 2023-04-21 (×5): qty 20

## 2023-04-21 MED ORDER — TURMERIC 500 MG PO CAPS: 500.0000 mg | ORAL_CAPSULE | Freq: Every day | ORAL | Status: DC

## 2023-04-21 MED ORDER — LIDOCAINE HCL (PF) 1 % IJ SOLN
10.0000 mL | Freq: Once | INTRAMUSCULAR | Status: AC
  Administered 2023-04-21: 10 mL via INTRADERMAL
  Filled 2023-04-21: qty 10

## 2023-04-21 MED ORDER — GUAIFENESIN ER 600 MG PO TB12
600.0000 mg | ORAL_TABLET | Freq: Two times a day (BID) | ORAL | Status: DC | PRN
  Administered 2023-04-22 – 2023-04-24 (×4): 600 mg via ORAL
  Filled 2023-04-21 (×4): qty 1

## 2023-04-21 MED ORDER — APIXABAN 2.5 MG PO TABS
2.5000 mg | ORAL_TABLET | Freq: Two times a day (BID) | ORAL | Status: DC
  Administered 2023-04-21 – 2023-04-26 (×12): 2.5 mg via ORAL
  Filled 2023-04-21 (×12): qty 1

## 2023-04-21 MED ORDER — ACETAMINOPHEN 325 MG PO TABS
650.0000 mg | ORAL_TABLET | Freq: Four times a day (QID) | ORAL | Status: DC | PRN
  Administered 2023-04-24 – 2023-04-25 (×2): 650 mg via ORAL
  Filled 2023-04-21 (×2): qty 2

## 2023-04-21 MED ORDER — DEXTROSE 5 % IV SOLN
500.0000 mg | INTRAVENOUS | Status: DC
  Administered 2023-04-21 – 2023-04-24 (×4): 500 mg via INTRAVENOUS
  Filled 2023-04-21 (×4): qty 5

## 2023-04-21 MED ORDER — DEXTROSE 5 % IV SOLN
500.0000 mg | Freq: Once | INTRAVENOUS | Status: AC
  Administered 2023-04-21: 500 mg via INTRAVENOUS
  Filled 2023-04-21: qty 5

## 2023-04-21 MED ORDER — DICLOFENAC SODIUM 1 % EX GEL: 2.0000 g | Freq: Four times a day (QID) | CUTANEOUS | Status: DC | PRN

## 2023-04-21 MED ORDER — MAGNESIUM OXIDE -MG SUPPLEMENT 400 (240 MG) MG PO TABS
200.0000 mg | ORAL_TABLET | Freq: Every day | ORAL | Status: DC
  Administered 2023-04-21 – 2023-04-28 (×8): 200 mg via ORAL
  Filled 2023-04-21 (×8): qty 1

## 2023-04-21 MED ORDER — FERROUS SULFATE 325 (65 FE) MG PO TABS
325.0000 mg | ORAL_TABLET | Freq: Every day | ORAL | Status: DC
  Administered 2023-04-21 – 2023-04-28 (×8): 325 mg via ORAL
  Filled 2023-04-21 (×8): qty 1

## 2023-04-21 MED ORDER — SODIUM CHLORIDE 0.9 % IV SOLN
1.0000 g | Freq: Once | INTRAVENOUS | Status: AC
  Administered 2023-04-21: 1 g via INTRAVENOUS
  Filled 2023-04-21: qty 10

## 2023-04-21 MED ORDER — FLUTICASONE PROPIONATE 50 MCG/ACT NA SUSP: 2.0000 | Freq: Every day | NASAL | Status: DC | PRN

## 2023-04-21 MED ORDER — VITAMIN C 500 MG PO TABS
1500.0000 mg | ORAL_TABLET | Freq: Every day | ORAL | Status: DC
Start: 2023-04-21 — End: 2023-04-28
  Administered 2023-04-21 – 2023-04-28 (×8): 1500 mg via ORAL
  Filled 2023-04-21 (×8): qty 3

## 2023-04-21 MED ORDER — NITROGLYCERIN 0.4 MG SL SUBL: 0.4000 mg | SUBLINGUAL_TABLET | SUBLINGUAL | Status: DC | PRN

## 2023-04-21 MED ORDER — ACETAMINOPHEN 650 MG RE SUPP: 650.0000 mg | Freq: Four times a day (QID) | RECTAL | Status: DC | PRN

## 2023-04-21 MED ORDER — MAGNESIUM HYDROXIDE 400 MG/5ML PO SUSP
30.0000 mL | Freq: Every day | ORAL | Status: DC | PRN
  Administered 2023-04-22 – 2023-04-25 (×3): 30 mL via ORAL
  Filled 2023-04-21 (×3): qty 30

## 2023-04-21 MED ORDER — ENOXAPARIN SODIUM 40 MG/0.4ML IJ SOSY: 40.0000 mg | PREFILLED_SYRINGE | INTRAMUSCULAR | Status: DC

## 2023-04-21 MED ORDER — LORATADINE 10 MG PO TABS
10.0000 mg | ORAL_TABLET | Freq: Every day | ORAL | Status: DC | PRN
  Administered 2023-04-26: 10 mg via ORAL
  Filled 2023-04-21: qty 1

## 2023-04-21 MED ORDER — BISMUTH SUBSALICYLATE 262 MG/15ML PO SUSP: 30.0000 mL | Freq: Four times a day (QID) | ORAL | Status: DC | PRN

## 2023-04-21 MED ORDER — IPRATROPIUM-ALBUTEROL 0.5-2.5 (3) MG/3ML IN SOLN
3.0000 mL | RESPIRATORY_TRACT | Status: DC | PRN
  Administered 2023-04-22 – 2023-04-23 (×2): 3 mL via RESPIRATORY_TRACT
  Filled 2023-04-21 (×2): qty 3

## 2023-04-21 MED ORDER — TRAZODONE HCL 50 MG PO TABS
50.0000 mg | ORAL_TABLET | Freq: Every evening | ORAL | Status: DC | PRN
  Administered 2023-04-21 – 2023-04-23 (×3): 50 mg via ORAL
  Filled 2023-04-21 (×3): qty 1

## 2023-04-21 MED ORDER — AMIODARONE HCL 200 MG PO TABS
100.0000 mg | ORAL_TABLET | Freq: Every day | ORAL | Status: DC
Start: 2023-04-21 — End: 2023-04-28
  Administered 2023-04-21 – 2023-04-28 (×8): 100 mg via ORAL
  Filled 2023-04-21 (×8): qty 1

## 2023-04-21 MED ORDER — SALINE SPRAY 0.65 % NA SOLN: 1.0000 | NASAL | Status: DC | PRN

## 2023-04-21 MED ORDER — PANTOPRAZOLE SODIUM 40 MG PO TBEC
40.0000 mg | DELAYED_RELEASE_TABLET | Freq: Every day | ORAL | Status: DC
  Administered 2023-04-21 – 2023-04-28 (×8): 40 mg via ORAL
  Filled 2023-04-21 (×8): qty 1

## 2023-04-21 MED ORDER — OLOPATADINE HCL 0.1 % OP SOLN: 1.0000 [drp] | Freq: Every day | OPHTHALMIC | Status: DC | PRN

## 2023-04-21 MED ORDER — ONDANSETRON HCL 4 MG/2ML IJ SOLN
4.0000 mg | Freq: Once | INTRAMUSCULAR | Status: AC
Start: 2023-04-21 — End: 2023-04-21
  Administered 2023-04-21: 4 mg via INTRAVENOUS
  Filled 2023-04-21: qty 2

## 2023-04-21 NOTE — Assessment & Plan Note (Signed)
-   Management as above. - O2 protocol will be followed.

## 2023-04-21 NOTE — Plan of Care (Signed)
  Problem: Clinical Measurements: Goal: Respiratory complications will improve Outcome: Progressing   Problem: Activity: Goal: Risk for activity intolerance will decrease Outcome: Progressing   Problem: Pain Management: Goal: General experience of comfort will improve Outcome: Progressing

## 2023-04-21 NOTE — Progress Notes (Signed)
PROGRESS NOTE    [name]  ZOX:[id number] DOB: 1946/05/19 DOA: 04/21/2023 PCP: [facility]    Assessment & Plan:   Principal Problem:   Recurrent right pleural effusion  Assessment and Plan:  Recurrent right pleural effusion: likely secondary to recent dx of lymphoma. Pt has yet to receive treatment and still in staging process as per pt's family at bedside. Thoracentesis w/ pleural fluid studies ordered.  Pneumonia: continue on IV rocephin, azithromycin, bronchodilators & encourage incentive spirometry.   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   CKDIIIa: Cr is trending down from day prior. Avoid nephrotoxic meds   Hypothyroidism: continue on home dose of levothyroxine    PAF: continue on home dose of amio, eliquis    GERD: continue on PPI   Acute on chronic hypoxic respiratory failure: continue on supplemental oxygen and wean back to baseline as tolerated.       DVT prophylaxis: eliquis  Code Status: full  Family Communication: discussed pt's care w/ pt's family at bedside and answered their questions  Disposition Plan: depends on PT/OT recs (not consulted yet)   Level of care: Telemetry Medical  Status is: Observation The patient remains OBS appropriate and will d/c before 2 midnights.    Consultants:    Procedures:   Antimicrobials: azithromycin, rocephin    Subjective: Pt c/o shortness of breath   Objective: Vitals:   04/21/23 0216 04/21/23 0400 04/21/23 0435 04/21/23 0530  BP:  (!) 88/50 99/72 111/60  Pulse:  (!) 56 (!) 58 (!) 59  Resp:  18 17 (!) 25  Temp: 98.3 F (36.8 C)     TempSrc: Oral     SpO2:  96% 99% 93%  Weight:      Height:       No intake or output data in the 24 hours ending 04/21/23 0838 Filed Weights   04/20/23 1949  Weight: 61.2 kg    Examination:  General exam:  Appears calm and comfortable  Respiratory system: decreased breath sounds b/l  Cardiovascular system: S1 & S2 +. No, rubs, gallops or clicks.  Gastrointestinal system: Abdomen is nondistended, soft and nontender. Normal bowel sounds heard. Central nervous system: Alert and oriented.  Psychiatry: Judgement and insight appear normal. Flat mood and affect    Data Reviewed: I have personally reviewed following labs and imaging studies  CBC: Recent Labs  Lab 04/15/23 0415 04/16/23 0448 04/17/23 0423 04/20/23 2002 04/21/23 0529  WBC 7.6 6.8 7.0 10.2 10.1  NEUTROABS  --   --   --  7.2  --   HGB 10.8* 11.0* 11.1* 12.4 11.0*  HCT 32.1* 33.6* 33.5* 38.5 33.3*  MCV 90.4 93.3 91.5 93.7 92.2  PLT 139* 134* 137* 169 147*   Basic Metabolic Panel: Recent Labs  Lab 04/15/23 0415 04/16/23 0448 04/17/23 0423 04/20/23 2002 04/21/23 0529  NA 136 136 135 132* 134*  K 3.8 3.8 4.0 4.2 4.3  CL 103 106 103 96* 98  CO2 24 26 25 27 26   GLUCOSE 109* 114* 98 121* 128*  BUN 19 19 16 19 16   CREATININE 0.75 0.80 0.62 0.90 0.72  CALCIUM 8.2* 8.2* 8.4* 8.3* 8.2*  MG  --  1.9  --   --   --    GFR: Estimated Creatinine Clearance: 49.9 mL/min (by C-G formula based on SCr of 0.72 mg/dL). Liver Function Tests: Recent Labs  Lab  04/15/23 0415 04/20/23 2002  AST 13* 20  ALT 14 12  ALKPHOS 30* 42  BILITOT 0.3 0.5  PROT 5.5* 7.1  ALBUMIN 2.8* 3.4*   No results for input(s): "LIPASE", "AMYLASE" in the last 168 hours. No results for input(s): "AMMONIA" in the last 168 hours. Coagulation Profile: No results for input(s): "INR", "PROTIME" in the last 168 hours. Cardiac Enzymes: No results for input(s): "CKTOTAL", "CKMB", "CKMBINDEX", "TROPONINI" in the last 168 hours. BNP (last 3 results) No results for input(s): "PROBNP" in the last 8760 hours. HbA1C: No results for input(s): "HGBA1C" in the last 72 hours. CBG: Recent Labs  Lab 04/19/23 1040  GLUCAP 119*   Lipid Profile: No results for  input(s): "CHOL", "HDL", "LDLCALC", "TRIG", "CHOLHDL", "LDLDIRECT" in the last 72 hours. Thyroid Function Tests: No results for input(s): "TSH", "T4TOTAL", "FREET4", "T3FREE", "THYROIDAB" in the last 72 hours. Anemia Panel: No results for input(s): "VITAMINB12", "FOLATE", "FERRITIN", "TIBC", "IRON", "RETICCTPCT" in the last 72 hours. Sepsis Labs: Recent Labs  Lab 04/21/23 0152  LATICACIDVEN 1.3    No results found for this or any previous visit (from the past 240 hour(s)).       Radiology Studies: DG Chest Port 1 View  Result Date: 04/20/2023 CLINICAL DATA:  Shortness of breath EXAM: PORTABLE CHEST 1 VIEW COMPARISON:  Radiographs 04/12/2023 and PET/CT 04/19/2023 FINDINGS: Moderate-large right pleural effusion and associated airspace opacities similar to PET/CT 04/19/2023 given differences in technique. The left lung is clear. Stable cardiomediastinal silhouette. Aortic atherosclerotic calcification. Loop recorder. Left reverse TSA. IMPRESSION: Moderate-large right pleural effusion and associated airspace opacities. Electronically Signed   By: Minerva Fester M.D.   On: 04/20/2023 22:38        Scheduled Meds:  ALPRAZolam  0.25 mg Oral BID   amiodarone  100 mg Oral Daily   apixaban  2.5 mg Oral BID   ascorbic acid  1,500 mg Oral Daily   Carboxymeth-Glycerin-Polysorb  2 drop Ophthalmic Daily   feeding supplement  237 mL Oral BID BM   ferrous sulfate  325 mg Oral Daily   levothyroxine  125 mcg Oral QAC breakfast   magnesium oxide  200 mg Oral Daily   omega-3 acid ethyl esters  1 capsule Oral Daily   pantoprazole  40 mg Oral Daily   Vitamin D-Vitamin K   Oral Daily   Continuous Infusions:  sodium chloride 40 mL/hr at 04/21/23 0441     LOS: 0 days       Charise Killian, MD [facility] Pager [phone]  If 7PM-7AM, please contact night-coverage [email or web address] 04/21/2023, 8:38 AM

## 2023-04-21 NOTE — Telephone Encounter (Signed)
FW: 4-week follow-up upright x-rays, okay for APP clinic From: Lovenia Kim, MD Sent: 04/17/2023   4:06 PM EST To: [facility] Subject: 4-week follow-up upright x-rays, okay for AP*  Received: 3 days ago Benbow, Caitlin F  P [facility] I spoke with [name] at [facility] who states she has to get this appointment authorized with the patient's PCP and will call our office back to schedule.

## 2023-04-21 NOTE — ED Provider Notes (Signed)
[facility] Provider Note    Event Date/Time   First MD Initiated Contact with Patient 04/21/23 0119     (approximate)   History   Shortness of Breath   HPI  [name] is a 77 y.o. female brought to the ED via EMS from home with a chief complaint of shortness of breath.  Patient with a history of COPD/asthma on baseline 3 L nasal cannula.  Has had to increase her oxygen to 5 L today.  She was unable to complete physical therapy due to shortness of breath.  Reports right-sided chest pain.  Chest x-ray done outpatient and was told she had "buildup" of fluid.  States she had a similar thing 2 weeks ago and had fluid drained from her lung.  Endorses nonproductive cough.  Denies fever/chills, abdominal pain, nausea, vomiting or dizziness.     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   Sacral fracture, closed (HCC) 04/15/2023   Fall 04/14/2023   Lumbar compression fracture (HCC) 04/14/2023   Unintentional weight loss 04/13/2023   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   Mood disorder (HCC) 04/13/2023   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) 04/12/2023   Pleural effusion on right 04/07/2023   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   Chronic kidney disease, stage 3a (HCC) 04/07/2023   Atrial fibrillation, chronic (HCC) 04/07/2023   DVT  (deep venous thrombosis) (HCC) 04/07/2023   Leukocytosis 04/07/2023   Right foot pain 04/07/2023   Acute sinusitis 05/23/2022   COPD exacerbation (HCC) 05/22/2022   Atrial fibrillation with RVR (HCC) 05/22/2022   Chest pain 05/22/2022   HTN (hypertension) 05/22/2022   Diabetes mellitus without complication (HCC) 05/22/2022   COPD (chronic obstructive pulmonary disease) (HCC) 05/22/2022   Hypothyroidism 05/22/2022   Iron deficiency anemia 05/22/2022   Osteoarthritis of right knee 03/14/2022     Past Surgical History   Past Surgical History:  Procedure Laterality Date   CATARACT EXTRACTION Bilateral    CHOLECYSTECTOMY     COLONOSCOPY     ESOPHAGOGASTRODUODENOSCOPY (EGD) WITH PROPOFOL N/A 01/17/2022   Procedure: ESOPHAGOGASTRODUODENOSCOPY (EGD) WITH PROPOFOL;  Surgeon: Jaynie Collins, DO;  Location: [facility] ENDOSCOPY;  Service: Gastroenterology;  Laterality: N/A;   HUMERUS SURGERY Left    fracture (7 surgeries total)   MENISCUS REPAIR Right 2019   THYROIDECTOMY     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; reconstruction from hip bone   TONSILECTOMY, ADENOIDECTOMY, BILATERAL MYRINGOTOMY AND TUBES     TOTAL KNEE ARTHROPLASTY Right 03/14/2022   Procedure: TOTAL KNEE ARTHROPLASTY;  Surgeon: Reinaldo Berber, MD;  Location: [facility] ORS;  Service: Orthopedics;  Laterality: Right;   TOTAL SHOULDER REPLACEMENT Left    TUBAL LIGATION     WRIST SURGERY Left    multiple fractures from falls (14 surgeries from wrist  to shoulder)     Home Medications   Prior to Admission medications   Medication Sig Start Date End Date Taking? Authorizing Provider  acetaminophen (TYLENOL) 325 MG tablet Take 1,300 mg by mouth every 6 (six) hours as needed for moderate pain.    [provider]  ADVAIR [facility] 409-81 MCG/ACT inhaler Inhale 2 puffs into the lungs 2 (two) times daily. 04/07/23   [provider]  albuterol (PROVENTIL) (2.5 MG/3ML) 0.083% nebulizer solution Take 2.5 mg by nebulization  every 4 (four) hours as needed for wheezing or shortness of breath.    [provider]  albuterol (VENTOLIN HFA) 108 (90 Base) MCG/ACT inhaler Inhale 2-4 puffs by mouth every 4 hours as needed for wheezing, cough, and/or shortness of breath 11/28/20   Loleta Rose, MD  ALPRAZolam Prudy Feeler) 0.25 MG tablet Take 1 tablet (0.25 mg total) by mouth 2 (two) times daily. 04/17/23 05/17/23  Leeroy Bock, MD  amiodarone (PACERONE) 100 MG tablet Take 1 tablet (100 mg total) by mouth daily. 04/12/23   Alford Highland, MD  apixaban (ELIQUIS) 2.5 MG TABS tablet Take 1 tablet (2.5 mg total) by mouth 2 (two) times daily. 04/17/23 06/16/23  Leeroy Bock, MD  Ascorbic Acid (VITAMIN C CR) 1500 MG TBCR Take 1 tablet by mouth daily.    [provider]  bismuth subsalicylate (PEPTO BISMOL) 262 MG/15ML suspension Take 30 mLs by mouth every 6 (six) hours as needed for indigestion or diarrhea or loose stools.    [provider]  Carboxymeth-Glycerin-Polysorb (REFRESH OPTIVE ADVANCED) 0.5-1-0.5 % SOLN Apply 2 drops to eye daily.    [provider]  diclofenac Sodium (VOLTAREN) 1 % GEL Apply 2 g topically 4 (four) times daily as needed.    [provider]  Emollient (EUCERIN) lotion Apply 1 Application topically as needed for dry skin.    [provider]  esomeprazole (NEXIUM) 40 MG capsule Take 40 mg by mouth 2 (two) times daily. 05/06/22   [provider]  feeding supplement (ENSURE ENLIVE / ENSURE PLUS) LIQD Take 237 mLs by mouth 2 (two) times daily between meals. 04/17/23   Leeroy Bock, MD  ferrous sulfate 325 (65 FE) MG tablet Take 325 mg by mouth daily.    [provider]  fluticasone (FLONASE) 50 MCG/ACT nasal spray Place 2 sprays into both nostrils daily as needed for allergies or rhinitis.    [provider]  guaiFENesin (MUCINEX) 600 MG 12 hr tablet Take 600 mg by mouth 2 (two) times daily as needed for to loosen phlegm  or cough.    [provider]  HYDROcodone-acetaminophen (NORCO/VICODIN) 5-325 MG tablet Take 1 tablet by mouth every 6 (six) hours as needed for up to 5 days for moderate pain (pain score 4-6) or severe pain (pain score 7-10). 04/17/23 04/22/23  Leeroy Bock, MD  ipratropium-albuterol (DUONEB) 0.5-2.5 (3) MG/3ML SOLN Take 3 mLs by nebulization every 4 (four) hours as needed. 02/14/23   [provider]  ketoconazole (NIZORAL) 2 % shampoo Apply 1 Application topically 2 (two) times a week.    [provider]  ketotifen (ZADITOR) 0.025 % ophthalmic solution Place 1 drop into both eyes daily as needed.    [provider]  levothyroxine (SYNTHROID) 125 MCG tablet Take 125 mcg by mouth daily before breakfast.    [provider]  loratadine (CLARITIN) 10 MG tablet Take 1 tablet (10 mg total) by mouth daily. Patient taking differently: Take 10 mg by  mouth daily as needed for allergies, rhinitis or itching. 05/25/22   Pennie Banter, DO  Magnesium 250 MG TABS Take 1 tablet by mouth daily.    [provider]  nitroGLYCERIN (NITROSTAT) 0.4 MG SL tablet Place 0.4 mg under the tongue every 5 (five) minutes as needed for chest pain.    [provider]  olopatadine (PATANOL) 0.1 % ophthalmic solution Place 1 drop into both eyes daily as needed for allergies.    [provider]  Omega-3 1000 MG CAPS Take 1 capsule by mouth daily.    [provider]  ondansetron (ZOFRAN) 4 MG tablet Take 4 mg by mouth every 8 (eight) hours as needed for nausea or vomiting.    [provider]  OXYGEN Inhale 2 L into the lungs at bedtime.    [provider]  rOPINIRole (REQUIP) 0.25 MG tablet Take 0.25 mg by mouth at bedtime. 03/30/23   [provider]  sodium chloride (OCEAN) 0.65 % SOLN nasal spray Place 1 spray into both nostrils as needed for congestion. 05/24/22   Pennie Banter, DO  SYSTANE ULTRA 0.4-0.3 % SOLN  Apply 2 drops to eye QID. 04/07/23   [provider]  traZODone (DESYREL) 50 MG tablet Take 50 mg by mouth at bedtime as needed for sleep. 03/14/23   [provider]  TURMERIC PO Take 500 mg by mouth daily.    [provider]  VITAMIN D-VITAMIN K PO Take 45 mcg by mouth daily.    [provider]  Zinc Oxide 10 % OINT Apply 1 application  topically 3 (three) times daily as needed.    [provider]     Allergies  Cephalosporins, Codeine, Contrast media [iodinated contrast media], and Oxycodone   Family History   Family History  Problem Relation Age of Onset   Kidney disease Mother    Tuberculosis Father      Physical Exam  Triage Vital Signs: ED Triage Vitals  Encounter Vitals Group     BP 04/20/23 1951 118/77     Systolic BP Percentile --      Diastolic BP Percentile --      Pulse Rate 04/20/23 1951 68     Resp 04/20/23 1951 20     Temp 04/20/23 1951 98.3 F (36.8 C)     Temp Source 04/20/23 1951 Oral     SpO2 04/20/23 1949 98 %     Weight 04/20/23 1949 135 lb (61.2 kg)     Height 04/20/23 1949 5\' 2"  (1.575 m)     Head Circumference --      Peak Flow --      Pain Score 04/20/23 1949 8     Pain Loc --      Pain Education --      Exclude from Growth Chart --     Updated Vital Signs: BP 99/72   Pulse (!) 58   Temp 98.3 F (36.8 C) (Oral)   Resp 17   Ht 5\' 2"  (1.575 m)   Wt 61.2 kg   SpO2 99%   BMI 24.69 kg/m    General: Awake, mild distress.  CV:  RRR. Good peripheral perfusion.  Resp:  Normal effort.  Diminished aeration bilaterally. Abd:  Nontender.  No distention.  Other:  Bilateral calves are supple without tenderness.   ED Results / Procedures / Treatments  Labs (all labs ordered are listed, but only abnormal results are displayed) Labs Reviewed  COMPREHENSIVE METABOLIC PANEL -  Abnormal; Notable for the following components:      Result Value   Sodium 132 (*)    Chloride 96 (*)    Glucose, Bld 121  (*)    Calcium 8.3 (*)    Albumin 3.4 (*)    All other components within normal limits  CULTURE, BLOOD (ROUTINE X 2)  CULTURE, BLOOD (ROUTINE X 2)  CBC WITH DIFFERENTIAL/PLATELET  LACTIC ACID, PLASMA  LACTIC ACID, PLASMA  BASIC METABOLIC PANEL  CBC  TROPONIN I (HIGH SENSITIVITY)  TROPONIN I (HIGH SENSITIVITY)     EKG  ED ECG REPORT I, Kendel Bessey J, the attending physician, personally viewed and interpreted this ECG.   Date: 04/21/2023  EKG Time: 1958  Rate: 65  Rhythm: normal sinus rhythm  Axis: Normal  Intervals:none  ST&T Change: Nonspecific    RADIOLOGY I have independently visualized and interpreted patient's imaging study as well as noted the radiology interpretation:  Chest x-ray: Moderate to large right pleural effusion and airspace opacities  Official radiology report(s): DG Chest Port 1 View  Result Date: 04/20/2023 CLINICAL DATA:  Shortness of breath EXAM: PORTABLE CHEST 1 VIEW COMPARISON:  Radiographs 04/12/2023 and PET/CT 04/19/2023 FINDINGS: Moderate-large right pleural effusion and associated airspace opacities similar to PET/CT 04/19/2023 given differences in technique. The left lung is clear. Stable cardiomediastinal silhouette. Aortic atherosclerotic calcification. Loop recorder. Left reverse TSA. IMPRESSION: Moderate-large right pleural effusion and associated airspace opacities. Electronically Signed   By: Minerva Fester M.D.   On: 04/20/2023 22:38     PROCEDURES:  Critical Care performed: Yes, see critical care procedure note(s)  CRITICAL CARE Performed by: Irean Hong   Total critical care time: 30 minutes  Critical care time was exclusive of separately billable procedures and treating other patients.  Critical care was necessary to treat or prevent imminent or life-threatening deterioration.  Critical care was time spent personally by me on the following activities: development of treatment plan with patient and/or surrogate as well as  nursing, discussions with consultants, evaluation of patient's response to treatment, examination of patient, obtaining history from patient or surrogate, ordering and performing treatments and interventions, ordering and review of laboratory studies, ordering and review of radiographic studies, pulse oximetry and re-evaluation of patient's condition.   Marland Kitchen1-3 Lead EKG Interpretation  Performed by: Irean Hong, MD Authorized by: Irean Hong, MD     Interpretation: normal     ECG rate:  65   ECG rate assessment: normal     Rhythm: sinus rhythm     Ectopy: none     Conduction: normal   Comments:     Patient placed on cardiac monitor to evaluate for arrhythmias    MEDICATIONS ORDERED IN ED: Medications  HYDROcodone-acetaminophen (NORCO/VICODIN) 5-325 MG per tablet 1 tablet (has no administration in time range)  amiodarone (PACERONE) tablet 100 mg (has no administration in time range)  nitroGLYCERIN (NITROSTAT) SL tablet 0.4 mg (has no administration in time range)  ALPRAZolam (XANAX) tablet 0.25 mg (has no administration in time range)  traZODone (DESYREL) tablet 50 mg (has no administration in time range)  levothyroxine (SYNTHROID) tablet 125 mcg (has no administration in time range)  bismuth subsalicylate (PEPTO BISMOL) 262 MG/15ML suspension 30 mL (has no administration in time range)  pantoprazole (PROTONIX) EC tablet 40 mg (has no administration in time range)  apixaban (ELIQUIS) tablet 2.5 mg (has no administration in time range)  ferrous sulfate tablet 325 mg (has no administration in time range)  Turmeric CAPS 500 mg (  has no administration in time range)  VITAMIN C CR TBCR 1 tablet (has no administration in time range)  Magnesium TABS 250 mg (has no administration in time range)  Omega-3 CAPS 1,000 mg (has no administration in time range)  Vitamin D-Vitamin K 20-120 MCG/0.1ML LIQD (has no administration in time range)  feeding supplement (ENSURE ENLIVE / ENSURE PLUS) liquid  237 mL (has no administration in time range)  fluticasone (FLONASE) 50 MCG/ACT nasal spray 2 spray (has no administration in time range)  guaiFENesin (MUCINEX) 12 hr tablet 600 mg (has no administration in time range)  ipratropium-albuterol (DUONEB) 0.5-2.5 (3) MG/3ML nebulizer solution 3 mL (has no administration in time range)  loratadine (CLARITIN) tablet 10 mg (has no administration in time range)  sodium chloride (OCEAN) 0.65 % nasal spray 1 spray (has no administration in time range)  Carboxymeth-Glycerin-Polysorb 0.5-1-0.5 % SOLN 2 drop (has no administration in time range)  diclofenac Sodium (VOLTAREN) 1 % topical gel 2 g (has no administration in time range)  olopatadine (PATANOL) 0.1 % ophthalmic solution 1 drop (has no administration in time range)  enoxaparin (LOVENOX) injection 40 mg (has no administration in time range)  0.9 %  sodium chloride infusion ( Intravenous New Bag/Given 04/21/23 0441)  acetaminophen (TYLENOL) tablet 650 mg (has no administration in time range)    Or  acetaminophen (TYLENOL) suppository 650 mg (has no administration in time range)  magnesium hydroxide (MILK OF MAGNESIA) suspension 30 mL (has no administration in time range)  ondansetron (ZOFRAN) tablet 4 mg (has no administration in time range)    Or  ondansetron (ZOFRAN) injection 4 mg (has no administration in time range)  fentaNYL (SUBLIMAZE) injection 25 mcg (25 mcg Intravenous Given 04/21/23 0154)  ondansetron (ZOFRAN) injection 4 mg (4 mg Intravenous Given 04/21/23 0153)  cefTRIAXone (ROCEPHIN) 1 g in sodium chloride 0.9 % 100 mL IVPB (0 g Intravenous Stopped 04/21/23 0241)  azithromycin (ZITHROMAX) 500 mg in dextrose 5 % 250 mL IVPB (0 mg Intravenous Stopped 04/21/23 0320)     IMPRESSION / MDM / ASSESSMENT AND PLAN / ED COURSE  I reviewed the triage vital signs and the nursing notes.                             77 year old female presenting with shortness of breath and right sided chest  pain. Differential includes, but is not limited to, viral syndrome, bronchitis including COPD exacerbation, pneumonia, reactive airway disease including asthma, CHF including exacerbation with or without pulmonary/interstitial edema, pneumothorax, ACS, thoracic trauma, and pulmonary embolism.  I have personally reviewed patient's records and note her recent hospitalization 11/8-11/04/2023 for fall with lumbar compression fracture and sacral fracture, status post thoracentesis 04/09/2023.  Patient's presentation is most consistent with acute presentation with potential threat to life or bodily function.  The patient is on the cardiac monitor to evaluate for evidence of arrhythmia and/or significant heart rate changes.  Laboratory results demonstrate normal WBC 10.2, mild hyponatremia with sodium 132, unremarkable troponin.  Given patient's cough and opacity seen on x-ray, will obtain blood cultures, lactic acid.  Chest x-ray demonstrates moderate to large sized pleural effusion.  She is less than 2 weeks status post right thoracentesis with reaccumulation of fluid and increased oxygen requirement.  Obtain blood cultures, lactic acid, start empiric IV antibiotics given opacity seen on chest x-ray and consult hospitalist services for evaluation and admission.      FINAL CLINICAL IMPRESSION(S) / ED  DIAGNOSES   Final diagnoses:  Shortness of breath  Hypoxia  Pleural effusion  Community acquired pneumonia, unspecified laterality     Rx / DC Orders   ED Discharge Orders     None        Note:  This document was prepared using Dragon voice recognition software and may include unintentional dictation errors.   Irean Hong, MD 04/21/23 [phone]

## 2023-04-21 NOTE — Assessment & Plan Note (Signed)
-   We will continue Synthroid. 

## 2023-04-21 NOTE — Telephone Encounter (Signed)
Patient is still admitted in the hospital. We will wait for discharge and approval from [facility].

## 2023-04-21 NOTE — Procedures (Signed)
PROCEDURE SUMMARY:  Preliminary US examination shows mildly loculated right pleural effusion. The largest pocket was selected to maximize potential drainage.  Successful image-guided right thoracentesis. Yielded 400 milliliters of serosanguineous fluid. Small amount of residual fluid remains post procedure due to loculations - currently not amenable to repeat thoracentesis. Patient tolerated procedure well. EBL: Zero No immediate complications.  Specimen was sent for labs.  Post procedure CXR pending.  Please see imaging section of Epic for full dictation.  Villa Herb PA-C 04/21/2023 3:16 PM

## 2023-04-21 NOTE — ED Notes (Addendum)
Patient placed on external urinary catheter due to having fractures in her back, and the inability to tolerate a bed pan. Pure wick placed by this RN, with the assistance of Elana, Rn.

## 2023-04-21 NOTE — Assessment & Plan Note (Signed)
-   The patient will be on supplemental coverage with NovoLog.

## 2023-04-21 NOTE — Assessment & Plan Note (Signed)
-   We will continue PPI therapy 

## 2023-04-21 NOTE — Assessment & Plan Note (Signed)
-   This may be associated with underlying pneumonia. - She will be admitted to a medical telemetry observation bed. - IR consult to be obtained for thoracentesis. - We will defer labs to a.m. hospitalist. - We will continue IV antibiotic herapy with IV Rocephin and Zithromax as well as mucolytic therapy and bronchodilator therapy. - O2 protocol will be followed.

## 2023-04-21 NOTE — Progress Notes (Signed)
PHARMACIST - PHYSICIAN ORDER COMMUNICATION  CONCERNING: P&T Medication Policy on Herbal Medications  DESCRIPTION:  This patient's order for:  Tumeric  has been noted.  This product(s) is classified as an "herbal" or natural product. Due to a lack of definitive safety studies or FDA approval, nonstandard manufacturing practices, plus the potential risk of unknown drug-drug interactions while on inpatient medications, the Pharmacy and Therapeutics Committee does not permit the use of "herbal" or natural products of this type within [facility].   ACTION TAKEN: The pharmacy department is unable to verify this order at this time. Please reevaluate patient's clinical condition at discharge and address if the herbal or natural product(s) should be resumed at that time.  Clovia Cuff, PharmD, BCPS 04/21/2023 7:34 AM

## 2023-04-21 NOTE — H&P (Signed)
[place]   PATIENT NAME: [name]    MR#:  [id number]  DATE OF BIRTH:  1945-09-10  DATE OF ADMISSION:  04/21/2023  PRIMARY CARE PHYSICIAN: [employer]   Patient is coming from: Home  REQUESTING/REFERRING PHYSICIAN: Chiquita Loth, MD  CHIEF COMPLAINT:   Chief Complaint  Patient presents with   Shortness of Breath    HISTORY OF PRESENT ILLNESS:  [name] is a 77 y.o. female with medical history significant for asthma, COPD, type diabetes mellitus, GERD, hypertension chronic hypoxic respiratory failure on home O2 at baseline of 2 L/min, and hypothyroidism, who presented to the emergency room with acute onset of worsening dyspnea with associated dry cough with right-sided chest pain with deep breathing.  The patient has been recently evaluated for lymphoma and had a PET scan on 04/19/2023 which report is currently pending.  She is scheduled to have bone marrow biopsy on 11/20.  She is at previous thoracentesis for right-sided pleural effusion.  She admits to chills without measured fever.  No nausea or vomiting or abdominal pain.  No bleeding diathesis.  No dysuria, oliguria or hematuria or flank pain.  She stated that she has been requiring 3 L of O2 by nasal cannula and today increased to 5 L.  ED Course: When she came to the ER, vital signs were within normal.  Pulsoxymeter was 95% on 3 L of O2 by nasal cannula that is above her baseline.  Labs revealed unremarkable CBC.  CMP revealed mild hyponatremia and hypochloremia with calcium of 8.3 and albumin 3.4 otherwise unremarkable CMP.  EKG as reviewed by me : ..  EKG showed normal sinus rhythm with a rate of 65 with right superior axis deviation and incomplete right bundle branch block. Imaging: Portable chest x-ray showed moderate to large right pleural effusion and associated airspace opacities.  The patient was given IV Rocephin and Zithromax, 25 mcg of IV fentanyl and 4 mg of IV Zofran.  She will be  admitted to a medical telemetry observation bed for further evaluation and management. PAST MEDICAL HISTORY:   Past Medical History:  Diagnosis Date   Anemia    Asthma    COPD (chronic obstructive pulmonary disease) (HCC)    Diabetes mellitus without complication (HCC)    type 2   DVT (deep venous thrombosis) (HCC) 1980   GERD (gastroesophageal reflux disease)    History of hiatal hernia    Hypertension    Hypothyroidism     PAST SURGICAL HISTORY:   Past Surgical History:  Procedure Laterality Date   CATARACT EXTRACTION Bilateral    CHOLECYSTECTOMY     COLONOSCOPY     ESOPHAGOGASTRODUODENOSCOPY (EGD) WITH PROPOFOL N/A 01/17/2022   Procedure: ESOPHAGOGASTRODUODENOSCOPY (EGD) WITH PROPOFOL;  Surgeon: Jaynie Collins, DO;  Location: [facility] ENDOSCOPY;  Service: Gastroenterology;  Laterality: N/A;   HUMERUS SURGERY Left    fracture (7 surgeries total)   MENISCUS REPAIR Right 2019   THYROIDECTOMY     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; reconstruction from hip bone   TONSILECTOMY, ADENOIDECTOMY, BILATERAL MYRINGOTOMY AND TUBES     TOTAL KNEE ARTHROPLASTY Right 03/14/2022   Procedure: TOTAL KNEE ARTHROPLASTY;  Surgeon: Reinaldo Berber, MD;  Location: [facility] ORS;  Service: Orthopedics;  Laterality: Right;   TOTAL SHOULDER REPLACEMENT Left    TUBAL LIGATION     WRIST SURGERY Left    multiple fractures from falls (14 surgeries from wrist to shoulder)    SOCIAL  HISTORY:   Social History   Tobacco Use   Smoking status: Never   Smokeless tobacco: Never  Substance Use Topics   Alcohol use: Never    FAMILY HISTORY:   Family History  Problem Relation Age of Onset   Kidney disease Mother    Tuberculosis Father     DRUG ALLERGIES:   Allergies  Allergen Reactions   Cephalosporins Itching   Codeine Other (See Comments)    Extreme drowsiness   Contrast Media [Iodinated Contrast Media] Hives and Swelling    Facial swelling   Oxycodone Other (See Comments)    Extreme  drowsiness    REVIEW OF SYSTEMS:   ROS As per history of present illness. All pertinent systems were reviewed above. Constitutional, HEENT, cardiovascular, respiratory, GI, GU, musculoskeletal, neuro, psychiatric, endocrine, integumentary and hematologic systems were reviewed and are otherwise negative/unremarkable except for positive findings mentioned above in the HPI.   MEDICATIONS AT HOME:   Prior to Admission medications   Medication Sig Start Date End Date Taking? Authorizing Provider  acetaminophen (TYLENOL) 325 MG tablet Take 1,300 mg by mouth every 6 (six) hours as needed for moderate pain.    [provider]  ADVAIR [facility] 782-95 MCG/ACT inhaler Inhale 2 puffs into the lungs 2 (two) times daily. 04/07/23   [provider]  albuterol (PROVENTIL) (2.5 MG/3ML) 0.083% nebulizer solution Take 2.5 mg by nebulization every 4 (four) hours as needed for wheezing or shortness of breath.    [provider]  albuterol (VENTOLIN HFA) 108 (90 Base) MCG/ACT inhaler Inhale 2-4 puffs by mouth every 4 hours as needed for wheezing, cough, and/or shortness of breath 11/28/20   Loleta Rose, MD  ALPRAZolam Prudy Feeler) 0.25 MG tablet Take 1 tablet (0.25 mg total) by mouth 2 (two) times daily. 04/17/23 05/17/23  Leeroy Bock, MD  amiodarone (PACERONE) 100 MG tablet Take 1 tablet (100 mg total) by mouth daily. 04/12/23   Alford Highland, MD  apixaban (ELIQUIS) 2.5 MG TABS tablet Take 1 tablet (2.5 mg total) by mouth 2 (two) times daily. 04/17/23 06/16/23  Leeroy Bock, MD  Ascorbic Acid (VITAMIN C CR) 1500 MG TBCR Take 1 tablet by mouth daily.    [provider]  bismuth subsalicylate (PEPTO BISMOL) 262 MG/15ML suspension Take 30 mLs by mouth every 6 (six) hours as needed for indigestion or diarrhea or loose stools.    [provider]  Carboxymeth-Glycerin-Polysorb (REFRESH OPTIVE ADVANCED) 0.5-1-0.5 % SOLN Apply 2 drops to eye daily.    [provider]  diclofenac Sodium (VOLTAREN) 1 % GEL Apply 2 g topically 4 (four) times daily as needed.    [provider]  Emollient (EUCERIN) lotion Apply 1 Application topically as needed for dry skin.    [provider]  esomeprazole (NEXIUM) 40 MG capsule Take 40 mg by mouth 2 (two) times daily. 05/06/22   [provider]  feeding supplement (ENSURE ENLIVE / ENSURE PLUS) LIQD Take 237 mLs by mouth 2 (two) times daily between meals. 04/17/23   Leeroy Bock, MD  ferrous sulfate 325 (65 FE) MG tablet Take 325 mg by mouth daily.    [provider]  fluticasone (FLONASE) 50 MCG/ACT nasal spray Place 2 sprays into both nostrils daily as needed for allergies or rhinitis.    [provider]  guaiFENesin (MUCINEX) 600 MG 12 hr tablet Take 600 mg by mouth 2 (two) times daily as needed for to loosen phlegm or cough.  [provider]  HYDROcodone-acetaminophen (NORCO/VICODIN) 5-325 MG tablet Take 1 tablet by mouth every 6 (six) hours as needed for up to 5 days for moderate pain (pain score 4-6) or severe pain (pain score 7-10). 04/17/23 04/22/23  Leeroy Bock, MD  ipratropium-albuterol (DUONEB) 0.5-2.5 (3) MG/3ML SOLN Take 3 mLs by nebulization every 4 (four) hours as needed. 02/14/23   [provider]  ketoconazole (NIZORAL) 2 % shampoo Apply 1 Application topically 2 (two) times a week.    [provider]  ketotifen (ZADITOR) 0.025 % ophthalmic solution Place 1 drop into both eyes daily as needed.    [provider]  levothyroxine (SYNTHROID) 125 MCG tablet Take 125 mcg by mouth daily before breakfast.    [provider]  loratadine (CLARITIN) 10 MG tablet Take 1 tablet (10 mg total) by mouth daily. Patient taking differently: Take 10 mg by mouth daily as needed for allergies, rhinitis or itching. 05/25/22   Pennie Banter, DO  Magnesium 250 MG TABS Take 1 tablet by mouth daily.    [provider]   nitroGLYCERIN (NITROSTAT) 0.4 MG SL tablet Place 0.4 mg under the tongue every 5 (five) minutes as needed for chest pain.    [provider]  olopatadine (PATANOL) 0.1 % ophthalmic solution Place 1 drop into both eyes daily as needed for allergies.    [provider]  Omega-3 1000 MG CAPS Take 1 capsule by mouth daily.    [provider]  ondansetron (ZOFRAN) 4 MG tablet Take 4 mg by mouth every 8 (eight) hours as needed for nausea or vomiting.    [provider]  OXYGEN Inhale 2 L into the lungs at bedtime.    [provider]  rOPINIRole (REQUIP) 0.25 MG tablet Take 0.25 mg by mouth at bedtime. 03/30/23   [provider]  sodium chloride (OCEAN) 0.65 % SOLN nasal spray Place 1 spray into both nostrils as needed for congestion. 05/24/22   Pennie Banter, DO  SYSTANE ULTRA 0.4-0.3 % SOLN Apply 2 drops to eye QID. 04/07/23   [provider]  traZODone (DESYREL) 50 MG tablet Take 50 mg by mouth at bedtime as needed for sleep. 03/14/23   [provider]  TURMERIC PO Take 500 mg by mouth daily.    [provider]  VITAMIN D-VITAMIN K PO Take 45 mcg by mouth daily.    [provider]  Zinc Oxide 10 % OINT Apply 1 application  topically 3 (three) times daily as needed.    [provider]      VITAL SIGNS:  Blood pressure 113/60, pulse (!) 58, temperature 98.3 F (36.8 C), temperature source Oral, resp. rate (!) 22, height 5\' 2"  (1.575 m), weight 61.2 kg, SpO2 100%.  PHYSICAL EXAMINATION:  Physical Exam  GENERAL:  77 y.o.-year-old patient lying in the bed with no acute distress.  EYES: Pupils equal, round, reactive to light and accommodation. No scleral icterus. Extraocular muscles intact.  HEENT: Head atraumatic, normocephalic. Oropharynx and nasopharynx clear.  NECK:  Supple, no jugular venous distention. No thyroid enlargement, no tenderness.  LUNGS: Normal breath sounds bilaterally, no  wheezing, rales,rhonchi or crepitation. No use of accessory muscles of respiration.  CARDIOVASCULAR: Regular rate and rhythm, S1, S2 normal. No murmurs, rubs, or gallops.  ABDOMEN: Soft, nondistended, nontender. Bowel sounds present. No organomegaly or mass.  EXTREMITIES: No pedal edema, cyanosis, or clubbing.  NEUROLOGIC: Cranial nerves II through XII are intact. Muscle strength 5/5 in  all extremities. Sensation intact. Gait not checked.  PSYCHIATRIC: The patient is alert and oriented x 3.  Normal affect and good eye contact. SKIN: No obvious rash, lesion, or ulcer.   LABORATORY PANEL:   CBC Recent Labs  Lab 04/21/23 0529  WBC 10.1  HGB 11.0*  HCT 33.3*  PLT 147*   ------------------------------------------------------------------------------------------------------------------  Chemistries  Recent Labs  Lab 04/16/23 0448 04/17/23 0423 04/20/23 2002 04/21/23 0529  NA 136   < > 132* 134*  K 3.8   < > 4.2 4.3  CL 106   < > 96* 98  CO2 26   < > 27 26  GLUCOSE 114*   < > 121* 128*  BUN 19   < > 19 16  CREATININE 0.80   < > 0.90 0.72  CALCIUM 8.2*   < > 8.3* 8.2*  MG 1.9  --   --   --   AST  --   --  20  --   ALT  --   --  12  --   ALKPHOS  --   --  42  --   BILITOT  --   --  0.5  --    < > = values in this interval not displayed.   ------------------------------------------------------------------------------------------------------------------  Cardiac Enzymes No results for input(s): "TROPONINI" in the last 168 hours. ------------------------------------------------------------------------------------------------------------------  RADIOLOGY:  DG Chest Port 1 View  Result Date: 04/20/2023 CLINICAL DATA:  Shortness of breath EXAM: PORTABLE CHEST 1 VIEW COMPARISON:  Radiographs 04/12/2023 and PET/CT 04/19/2023 FINDINGS: Moderate-large right pleural effusion and associated airspace opacities similar to PET/CT 04/19/2023 given differences in technique. The left lung is  clear. Stable cardiomediastinal silhouette. Aortic atherosclerotic calcification. Loop recorder. Left reverse TSA. IMPRESSION: Moderate-large right pleural effusion and associated airspace opacities. Electronically Signed   By: Minerva Fester M.D.   On: 04/20/2023 22:38      IMPRESSION AND PLAN:  Assessment and Plan: * Recurrent right pleural effusion - This may be associated with underlying pneumonia. - She will be admitted to a medical telemetry observation bed. - IR consult to be obtained for thoracentesis. - We will defer labs to a.m. hospitalist. - We will continue IV antibiotic herapy with IV Rocephin and Zithromax as well as mucolytic therapy and bronchodilator therapy. - O2 protocol will be followed.  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  Hypothyroidism - We will continue Synthroid.  Paroxysmal atrial fibrillation (HCC) - We will continue amiodarone and Eliquis.  GERD without esophagitis - We will continue PPI therapy  Acute on chronic respiratory failure with hypoxia (HCC) - Management as above. - O2 protocol will be followed.       DVT prophylaxis: Eliquis. Advanced Care Planning:  Code Status: full code. Family Communication:  The plan of care was discussed in details with the patient (and family). I answered all questions. The patient agreed to proceed with the above mentioned plan. Further management will depend upon hospital course. Disposition Plan: Back to previous home environment Consults called: IR All the records are reviewed and case discussed with ED provider.  Status is: Observation   I certify that at the time of admission, it is my clinical judgment that the patient will require  hospital care extending less than 2 midnights.  Dispo: The patient is from: Home              Anticipated d/c is to: Home               Patient currently is not medically stable to d/c.              Difficult to place patient: No  Hannah Beat M.D on 04/21/2023 at 8:56 AM  [facility]   From 7 PM-7 AM, contact night-coverage [email or web address]  CC: Primary care physician; [facility], [employer]

## 2023-04-21 NOTE — Assessment & Plan Note (Signed)
-  We will continue amiodarone and Eliquis. 

## 2023-04-22 ENCOUNTER — Inpatient Hospital Stay: Payer: Medicare (Managed Care)

## 2023-04-22 DIAGNOSIS — C8590 Non-Hodgkin lymphoma, unspecified, unspecified site: Secondary | ICD-10-CM

## 2023-04-22 DIAGNOSIS — J90 Pleural effusion, not elsewhere classified: Secondary | ICD-10-CM | POA: Diagnosis not present

## 2023-04-22 LAB — BASIC METABOLIC PANEL
Anion gap: 10 (ref 5–15)
BUN: 13 mg/dL (ref 8–23)
CO2: 26 mmol/L (ref 22–32)
Calcium: 8.1 mg/dL — ABNORMAL LOW (ref 8.9–10.3)
Chloride: 99 mmol/L (ref 98–111)
Creatinine, Ser: 0.66 mg/dL (ref 0.44–1.00)
GFR, Estimated: >60 mL/min (ref >60)
Glucose, Bld: 96 mg/dL (ref 70–99)
Potassium: 4.1 mmol/L (ref 3.5–5.1)
Sodium: 135 mmol/L (ref 135–145)

## 2023-04-22 LAB — CBC
HCT: 31.2 % — ABNORMAL LOW (ref 36.0–46.0)
Hemoglobin: 10.3 g/dL — ABNORMAL LOW (ref 12.0–15.0)
MCH: 30.4 pg (ref 26.0–34.0)
MCHC: 33 g/dL (ref 30.0–36.0)
MCV: 92 fL (ref 80.0–100.0)
Platelets: 142 10*3/uL — ABNORMAL LOW (ref 150–400)
RBC: 3.39 MIL/uL — ABNORMAL LOW (ref 3.87–5.11)
RDW: 13.3 % (ref 11.5–15.5)
WBC: 6.1 10*3/uL (ref 4.0–10.5)
nRBC: 0 % (ref 0.0–0.2)

## 2023-04-22 NOTE — Evaluation (Signed)
Occupational Therapy Evaluation Patient Details Name: [name] MRN: [id number] DOB: [birth year] Today's Date: 04/22/2023   History of Present Illness 04/22/23 ED Recurrent right pleural effusion: likely secondary to recent dx of lymphoma. Pt has yet to receive treatment and still in staging process as per pt's family at bedside. S/p thoracentesis. Pleural fluid cx NGTD. Pleural fluid path is pending      Pneumonia: continue on IV rocephin, azithromycin, bronchodilators & encourage incentive spirometry. Tussionex prn for cough      B cell lymphoma: recent dx. Still staging process as per pt's family. Oncologist is Dr. Cathie Hoops. Has not started treatment yet      Hx of cirrhosis: as per pt's family. Holding home dose of lasix       CKDIIIa: Cr is better than baseline.      Hypothyroidism: continue on home dose of levothyroxine      PAF: continue on home dose of amio, eliquis      GERD: continue on PPI      Acute on chronic hypoxic respiratory failure: continue on supplemental oxygen and wean back to baseline as tolerated.   Clinical Impression   [name] was seen for OT evaluation today- Upon arrival to room pt in bed- nsg reported she walked with sone about 2 hrs prior - pt on 2 L of O2 and O2 sats 98%- pt motivated to get up - ambulate to bathroom using RW- Supervision - v/c and reminder for pt to log roll and not pull up on walker- pt ind with supervision in toilet hygiene and clothing management , grooming at sink- pt complain of back pain from pelvic and lumbar fx earlier this month- she does have brace at home that helps for pain. Pt on room air coming back O 2 drops to 86 % - on O2 she increase to 98 and when kept on and walking 2 x to door - stay above 96% - on room air ambulate to door and back - decrease to 93%. Pt report she recently got AE for LB dressing and working well - has some trouble with L hand using sock aid -and notice during evaluation - pt pushing herself until fatigue - pt can  benefit from review of energy conservation - pt has one story house , walk in shower and use 2- 3 L of O 2 - goes to PACE program every day since last hospitalization -and sone and D-I-L brings meals for her . Pt is functionally limited in ADL's and IADL's by significant low back pain with movement, decreased activity tolerance, and limited functional use of her LUE. She requires SUPERVISION for safety  and pacing for all ADL management during session. Pt can benefit from skilled OT services to maximize return to PLOF and minimize risk of future falls, injury, caregiver burden, and readmission.      If plan is discharge home, recommend the following:      Functional Status Assessment  Patient has had a recent decline in their functional status and demonstrates the ability to make significant improvements in function in a reasonable and predictable amount of time.  Equipment Recommendations       Recommendations for Other Services       Precautions / Restrictions Precautions Precautions: Fall Restrictions Weight Bearing Restrictions: No      Mobility Bed Mobility   Bed Mobility: Supine to Sit, Sit to Supine     Supine to sit: Supervision (cueing for log roll) Sit to supine: Supervision  Transfers   Equipment used: Rolling walker (2 wheels) Transfers: Sit to/from Stand Sit to Stand: Supervision           General transfer comment: verbal cueing - pulling up on walker      Balance Overall balance assessment: Modified Independent Sitting-balance support: Feet supported, No upper extremity supported Sitting balance-Leahy Scale: Normal Sitting balance - Comments: No LOB appreciated during weight shift with functional activity.   Standing balance support: No upper extremity supported Standing balance-Leahy Scale: Good Standing balance comment: able to static stand without UE support.                           ADL either performed or assessed with  clinical judgement   ADL Overall ADL's : Modified independent                       Lower Body Dressing Details (indicate cue type and reason): Pt report she just recently started using Sock aid and reacher - some trouble using L hand for sock aid- Supervision toiletting, grooming at sink - eating ind -bathing adn dressing min A Toilet Transfer: Ambulation;Rolling walker (2 wheels);Supervision/safety;Set up           Functional mobility during ADLs: Rolling walker (2 wheels);Set up;Supervision/safety       Vision Patient Visual Report: No change from baseline       Perception         Praxis         Pertinent Vitals/Pain Pain Assessment Pain Assessment: 0-10 Pain Score: 10-Worst pain ever Pain Location: back/tailbone  when sitting down ,and not wearing her brace Pain Descriptors / Indicators: Aching, Sore, Grimacing Pain Intervention(s): Limited activity within patient's tolerance     Extremity/Trunk Assessment Upper Extremity Assessment Upper Extremity Assessment: Right hand dominant LUE Deficits / Details: Hx of multiple sx in LUE, significant pain with functional use and weight bearing. Able to use to stabalize objects and as "helper hand" but othwerise limits use as much as possible - pain with movement           Communication Communication Communication: Hearing impairment   Cognition Arousal: Alert Behavior During Therapy: WFL for tasks assessed/performed Overall Cognitive Status: Within Functional Limits for tasks assessed                                       General Comments       Exercises     Shoulder Instructions      Home Living Family/patient expects to be discharged to:: Private residence Living Arrangements: Alone Available Help at Discharge: Family;Available PRN/intermittently Type of Home: Apartment       Home Layout: One level     Bathroom Shower/Tub: Producer, television/film/video: Standard      Home Equipment: Rollator (4 wheels);Cane - single point;Shower seat   Additional Comments: 2-3 L O2      Prior Functioning/Environment Prior Level of Function : Independent/Modified Independent             Mobility Comments: participates with PACE programs since last admission-  go 5 x wk now 9am- 3pm. Mod I with cane for ambulation. walks a mile 3 days per week prior to first admission this month          OT Problem List: Decreased strength;Decreased activity tolerance;Decreased safety  awareness;Impaired balance (sitting and/or standing);Decreased knowledge of use of DME or AE;Cardiopulmonary status limiting activity;Pain      OT Treatment/Interventions: Self-care/ADL training;Therapeutic exercise;Therapeutic activities;Energy conservation;DME and/or AE instruction;Patient/family education;Balance training    OT Goals(Current goals can be found in the care plan section) Acute Rehab OT Goals Patient Stated Goal: To go home safely OT Goal Formulation: With patient Time For Goal Achievement: 04/29/23 Potential to Achieve Goals: Good  OT Frequency: Min 2X/week    Co-evaluation              AM-PAC OT "6 Clicks" Daily Activity     Outcome Measure Help from another person eating meals?: None Help from another person taking care of personal grooming?: None Help from another person toileting, which includes using toliet, bedpan, or urinal?: A Little Help from another person bathing (including washing, rinsing, drying)?: A Little Help from another person to put on and taking off regular upper body clothing?: None Help from another person to put on and taking off regular lower body clothing?: A Little 6 Click Score: 21   End of Session Equipment Utilized During Treatment: Gait belt;Rolling walker (2 wheels) Nurse Communication: Mobility status (O2 level)  Activity Tolerance: Patient tolerated treatment well;Patient limited by pain;Patient limited by fatigue Patient left:  in bed;with bed alarm set;with call bell/phone within reach  OT Visit Diagnosis: Other abnormalities of gait and mobility (R26.89);Muscle weakness (generalized) (M62.81);Unsteadiness on feet (R26.81);Repeated falls (R29.6);History of falling (Z91.81);Pain Pain - part of body:  (back)                Time: 1528-1600 OT Time Calculation (min): 32 min Charges:  OT General Charges $OT Visit: 1 Visit OT Treatments $Self Care/Home Management : 8-22 mins    Kael Keetch OTR/L,CLT 04/22/2023, 4:12 PM

## 2023-04-22 NOTE — Plan of Care (Signed)
m °

## 2023-04-22 NOTE — Progress Notes (Signed)
Patient complaining of worsening pain with SOB. Patient currently on 2 L O2. VS stable BP 116/50 HR 70 SO2 97% RR 22 T 98.3. Morphine given earlier with no relief. Patient verbalizes feeling very weak. MD Mayford Knife Made aware. New order for Xray Stat.  Breathing tx administered.

## 2023-04-22 NOTE — Progress Notes (Signed)
PROGRESS NOTE    [name]  MVH:[id number] DOB: [birth year] DOA: 04/21/2023 PCP: [facility]    Assessment & Plan:   Principal Problem:   Recurrent right pleural effusion Active Problems:   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)   Hypothyroidism   Acute on chronic respiratory failure with hypoxia (HCC)   GERD without esophagitis   Paroxysmal atrial fibrillation (HCC)   Pneumonia  Assessment and Plan:  Recurrent right pleural effusion: likely secondary to recent dx of lymphoma. Pt has yet to receive treatment and still in staging process as per pt's family at bedside. S/p thoracentesis. Pleural fluid cx NGTD. Pleural fluid path is pending   Pneumonia: continue on IV rocephin, azithromycin, bronchodilators & encourage incentive spirometry. 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  Hx of cirrhosis: as per pt's family. Holding home dose of lasix     CKDIIIa: Cr is better than baseline.    Hypothyroidism: continue on home dose of levothyroxine    PAF: continue on home dose of amio, eliquis    GERD: continue on PPI   Acute on chronic hypoxic respiratory failure: continue on supplemental oxygen and wean back to baseline as tolerated.       DVT prophylaxis: eliquis  Code Status: full  Family Communication: discussed pt's care w/ pt's son and answered his questions  Disposition Plan: depends on PT/OT recs  Level of care: Telemetry Medical  Status is: Inpatient Remains inpatient appropriate because: severity of illness      Consultants:    Procedures:   Antimicrobials: azithromycin, rocephin    Subjective: Pt c/o cough   Objective: Vitals:   04/21/23 1636 04/21/23 2146 04/22/23 0406 04/22/23 0739  BP: (!) 102/56 (!) 102/53 (!) 104/51 (!) 92/44   Pulse: (!) 59 69 63 62  Resp: 16 20 20 16   Temp: 98 F (36.7 C) 97.8 F (36.6 C) 97.9 F (36.6 C) 98.1 F (36.7 C)  TempSrc: Oral   Oral  SpO2: 99% 96% 97% (!) 89%  Weight:      Height:        Intake/Output Summary (Last 24 hours) at 04/22/2023 0911 Last data filed at 04/21/2023 1702 Gross per 24 hour  Intake 466.04 ml  Output --  Net 466.04 ml   Filed Weights   04/20/23 1949  Weight: 61.2 kg    Examination:  General exam: appears uncomfortable  Respiratory system: diminished breath sounds b/l  Cardiovascular system: S1/S2+. No rubs or clicks   Gastrointestinal system: abd is soft, NT, ND & hypoactive bowel sounds  Central nervous system: alert & oriented.  Psychiatry: Judgement and insight appears poor. Flat mood and affect     Data Reviewed: I have personally reviewed following labs and imaging studies  CBC: Recent Labs  Lab 04/16/23 0448 04/17/23 0423 04/20/23 2002 04/21/23 0529 04/22/23 0347  WBC 6.8 7.0 10.2 10.1 6.1  NEUTROABS  --   --  7.2  --   --   HGB 11.0* 11.1* 12.4 11.0* 10.3*  HCT 33.6* 33.5* 38.5 33.3* 31.2*  MCV 93.3 91.5 93.7 92.2 92.0  PLT 134* 137* 169 147* 142*   Basic Metabolic Panel: Recent Labs  Lab 04/16/23 0448 04/17/23 0423 04/20/23 2002 04/21/23 0529 04/22/23 0347  NA 136 135 132* 134* 135  K 3.8 4.0 4.2 4.3 4.1  CL 106 103 96* 98 99  CO2 26 25 27 26 26   GLUCOSE 114* 98 121* 128* 96  BUN 19 16 19 16 13   CREATININE 0.80 0.62 0.90 0.72 0.66  CALCIUM 8.2* 8.4* 8.3* 8.2* 8.1*  MG 1.9  --   --   --   --    GFR: Estimated Creatinine Clearance: 49.9 mL/min (by C-G formula based on SCr of 0.66 mg/dL). Liver Function Tests: Recent Labs  Lab 04/20/23 2002  AST 20  ALT 12  ALKPHOS 42  BILITOT 0.5  PROT 7.1  ALBUMIN 3.4*   No results for input(s): "LIPASE", "AMYLASE" in the last 168 hours. No results for input(s): "AMMONIA" in the last 168 hours. Coagulation Profile: No results for input(s): "INR", "PROTIME" in  the last 168 hours. Cardiac Enzymes: No results for input(s): "CKTOTAL", "CKMB", "CKMBINDEX", "TROPONINI" in the last 168 hours. BNP (last 3 results) No results for input(s): "PROBNP" in the last 8760 hours. HbA1C: No results for input(s): "HGBA1C" in the last 72 hours. CBG: Recent Labs  Lab 04/19/23 1040  GLUCAP 119*   Lipid Profile: No results for input(s): "CHOL", "HDL", "LDLCALC", "TRIG", "CHOLHDL", "LDLDIRECT" in the last 72 hours. Thyroid Function Tests: No results for input(s): "TSH", "T4TOTAL", "FREET4", "T3FREE", "THYROIDAB" in the last 72 hours. Anemia Panel: No results for input(s): "VITAMINB12", "FOLATE", "FERRITIN", "TIBC", "IRON", "RETICCTPCT" in the last 72 hours. Sepsis Labs: Recent Labs  Lab 04/21/23 0152  LATICACIDVEN 1.3    Recent Results (from the past 240 hour(s))  Culture, blood (routine x 2)     Status: None (Preliminary result)   Collection Time: 04/21/23  1:47 AM   Specimen: BLOOD  Result Value Ref Range Status   Specimen Description BLOOD BLOOD RIGHT FOREARM  Final   Special Requests   Final    BOTTLES DRAWN AEROBIC AND ANAEROBIC Blood Culture adequate volume   Culture   Final    NO GROWTH 1 DAY Performed at [facility], [address], [place], [place] [address]    Report Status PENDING  Incomplete  Culture, blood (routine x 2)     Status: None (Preliminary result)   Collection Time: 04/21/23  1:51 AM   Specimen: BLOOD  Result Value Ref Range Status   Specimen Description BLOOD WEAKLY REACTIVE  Final   Special Requests   Final    BOTTLES DRAWN AEROBIC AND ANAEROBIC Blood Culture adequate volume   Culture   Final    NO GROWTH 1 DAY Performed at [facility], [address] [place], [place] [address]    Report Status PENDING  Incomplete  Body fluid culture w Gram Stain     Status: None (Preliminary result)   Collection Time: 04/21/23  3:56 PM   Specimen: PATH Cytology Pleural fluid  Result Value Ref Range Status    Specimen Description   Final    PLEURAL Performed at [facility], [address] [place], [place] [address]    Special Requests   Final    NONE Performed at [facility], [address], [place], [place] [address]    Gram Stain   Final    WBC PRESENT, PREDOMINANTLY MONONUCLEAR NO ORGANISMS SEEN    Culture   Final    NO GROWTH < 12 HOURS Performed at [facility], [address], [place], [place] [address]    Report Status PENDING  Incomplete         Radiology Studies: DG Chest Port 1  View  Result Date: 04/21/2023 CLINICAL DATA:  Pleural effusion status post thoracentesis. EXAM: PORTABLE CHEST 1 VIEW COMPARISON:  04/20/2023. FINDINGS: Cardiac silhouette is obscured. Large right-sided pleural effusion. Left lung clear. Old healed adjacent left-sided posterior fifth and sixth rib fractures. Calcified aorta. Thoracic degenerative changes. No pneumothorax. IMPRESSION: Large right-sided pleural effusion. Electronically Signed   By: Layla Maw M.D.   On: 04/21/2023 18:16   US THORACENTESIS ASP PLEURAL SPACE W/IMG GUIDE  Result Date: 04/21/2023 INDICATION: Patient with history of COPD, lymphoma and recurrent right pleural effusion. Request for diagnostic and therapeutic right thoracentesis EXAM: ULTRASOUND GUIDED RIGHT THORACENTESIS MEDICATIONS: 8 mL 1% lidocaine COMPLICATIONS: None immediate. PROCEDURE: An ultrasound guided thoracentesis was thoroughly discussed with the patient and questions answered. The benefits, risks, alternatives and complications were also discussed. The patient understands and wishes to proceed with the procedure. Written consent was obtained. Ultrasound was performed to localize and mark an adequate pocket of fluid in the right chest. Pleural effusion noted to be mildly loculated and the largest pocket was selected for drainage. The area was then prepped and draped in the normal sterile fashion. 1% Lidocaine was used for local  anesthesia. Under ultrasound guidance a 6 Fr Safe-T-Centesis catheter was introduced. Thoracentesis was performed. The catheter was removed and a dressing applied. FINDINGS: A total of approximately 400 mL of serosanguineous fluid was removed. Samples were sent to the laboratory as requested by the clinical team. IMPRESSION: Successful ultrasound guided right thoracentesis yielding 400 mL of pleural fluid. Performed by Lynnette Caffey, PA-C Electronically Signed   By: Irish Lack M.D.   On: 04/21/2023 15:52   DG Chest Port 1 View  Result Date: 04/20/2023 CLINICAL DATA:  Shortness of breath EXAM: PORTABLE CHEST 1 VIEW COMPARISON:  Radiographs 04/12/2023 and PET/CT 04/19/2023 FINDINGS: Moderate-large right pleural effusion and associated airspace opacities similar to PET/CT 04/19/2023 given differences in technique. The left lung is clear. Stable cardiomediastinal silhouette. Aortic atherosclerotic calcification. Loop recorder. Left reverse TSA. IMPRESSION: Moderate-large right pleural effusion and associated airspace opacities. Electronically Signed   By: Minerva Fester M.D.   On: 04/20/2023 22:38        Scheduled Meds:  ALPRAZolam  0.25 mg Oral BID   amiodarone  100 mg Oral Daily   apixaban  2.5 mg Oral BID   ascorbic acid  1,500 mg Oral Daily   feeding supplement  237 mL Oral BID BM   ferrous sulfate  325 mg Oral Daily   levothyroxine  125 mcg Oral QAC breakfast   magnesium oxide  200 mg Oral Daily   omega-3 acid ethyl esters  1 capsule Oral Daily   pantoprazole  40 mg Oral Daily   polyvinyl alcohol  2 drop Both Eyes Daily   Continuous Infusions:  azithromycin 500 mg (04/21/23 2246)   cefTRIAXone (ROCEPHIN)  IV 2 g (04/21/23 2125)     LOS: 1 day       Charise Killian, MD [facility] Pager [phone]  If 7PM-7AM, please contact night-coverage [email or web address] 04/22/2023, 9:11 AM

## 2023-04-23 DIAGNOSIS — J90 Pleural effusion, not elsewhere classified: Secondary | ICD-10-CM | POA: Diagnosis not present

## 2023-04-23 DIAGNOSIS — C8590 Non-Hodgkin lymphoma, unspecified, unspecified site: Secondary | ICD-10-CM | POA: Diagnosis not present

## 2023-04-23 LAB — BASIC METABOLIC PANEL
Anion gap: 11 (ref 5–15)
BUN: 11 mg/dL (ref 8–23)
CO2: 27 mmol/L (ref 22–32)
Calcium: 8.2 mg/dL — ABNORMAL LOW (ref 8.9–10.3)
Chloride: 98 mmol/L (ref 98–111)
Creatinine, Ser: 0.61 mg/dL (ref 0.44–1.00)
GFR, Estimated: >60 mL/min (ref >60)
Glucose, Bld: 119 mg/dL — ABNORMAL HIGH (ref 70–99)
Potassium: 3.8 mmol/L (ref 3.5–5.1)
Sodium: 136 mmol/L (ref 135–145)

## 2023-04-23 LAB — CBC
HCT: 31.8 % — ABNORMAL LOW (ref 36.0–46.0)
Hemoglobin: 10.6 g/dL — ABNORMAL LOW (ref 12.0–15.0)
MCH: 30.5 pg (ref 26.0–34.0)
MCHC: 33.3 g/dL (ref 30.0–36.0)
MCV: 91.6 fL (ref 80.0–100.0)
Platelets: 134 10*3/uL — ABNORMAL LOW (ref 150–400)
RBC: 3.47 MIL/uL — ABNORMAL LOW (ref 3.87–5.11)
RDW: 13.2 % (ref 11.5–15.5)
WBC: 5.7 10*3/uL (ref 4.0–10.5)
nRBC: 0 % (ref 0.0–0.2)

## 2023-04-23 MED ORDER — FENTANYL 12 MCG/HR TD PT72
1.0000 | MEDICATED_PATCH | TRANSDERMAL | Status: DC
  Administered 2023-04-23 – 2023-04-26 (×2): 1 via TRANSDERMAL
  Filled 2023-04-23 (×2): qty 1

## 2023-04-23 MED ORDER — METHYLPREDNISOLONE SODIUM SUCC 40 MG IJ SOLR
40.0000 mg | Freq: Two times a day (BID) | INTRAMUSCULAR | Status: DC
  Administered 2023-04-23 (×2): 40 mg via INTRAVENOUS
  Filled 2023-04-23 (×2): qty 1

## 2023-04-23 MED ORDER — FUROSEMIDE 10 MG/ML IJ SOLN
40.0000 mg | Freq: Every day | INTRAMUSCULAR | Status: DC
  Administered 2023-04-23 – 2023-04-28 (×6): 40 mg via INTRAVENOUS
  Filled 2023-04-23 (×6): qty 4

## 2023-04-23 NOTE — Evaluation (Signed)
Physical Therapy Evaluation Patient Details Name: [name] MRN: [id number] DOB: January 09, 1946 Today's Date: 04/23/2023  History of Present Illness  Patient is a 77 year old female with recurrent right pleural effusion. 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  Clinical Impression  Patient is agreeable to PT evaluation and eager to try walking in the hallway. She reports her back pain is better with applying the TLSO with mobility efforts. Sp02 was 95% on 1 L 02 at rest. She requested to increased 02 to 2 L with ambulation with Sp02 94% while walking. She completed walking a lap around the nursing station with 3 standing rest breaks required. Patient with mild dizziness and dyspnea with activity. Recommend to continue PT to maximize independence and decrease caregiver burden. Patient is hopeful to return home with family support after this hospital stay.       If plan is discharge home, recommend the following: A little help with walking and/or transfers;A little help with bathing/dressing/bathroom;Assist for transportation;Help with stairs or ramp for entrance;Assistance with cooking/housework   Can travel by private vehicle        Equipment Recommendations None recommended by PT  Recommendations for Other Services       Functional Status Assessment Patient has had a recent decline in their functional status and demonstrates the ability to make significant improvements in function in a reasonable and predictable amount of time.     Precautions / Restrictions Precautions Precautions: Fall Precaution Comments: history of lumbar fracture and multi level stenosis. TLSO Restrictions Weight Bearing Restrictions: No      Mobility  Bed Mobility Overal  bed mobility: Needs Assistance Bed Mobility: Supine to Sit, Sit to Supine     Supine to sit: Contact guard Sit to supine: Min assist   General bed mobility comments: assistance for LE support. TLSO donned prior to standing with improvement in pain reported    Transfers Overall transfer level: Needs assistance Equipment used: Rolling walker (2 wheels) Transfers: Sit to/from Stand Sit to Stand: Contact guard assist           General transfer comment: CGA for safety    Ambulation/Gait Ambulation/Gait assistance: Contact guard assist Gait Distance (Feet): 175 Feet Assistive device: Rolling walker (2 wheels) Gait Pattern/deviations: Step-through pattern, Decreased stride length       General Gait Details: patient walked one lap around the unit with rolling walker. 3 standing rest breaks required. Sp02 94 % on 2 L02 (patient requested to turn up oxygen from 1 L at rest to 2 L with activity). patient has dyspnea with exertion. mild dizziness reported towards end of ambulation bout.  Stairs            Wheelchair Mobility     Tilt Bed    Modified Rankin (Stroke Patients Only)       Balance Overall balance assessment: Modified Independent Sitting-balance support: Feet supported, No upper extremity supported Sitting balance-Leahy Scale: Normal     Standing balance support: Single extremity supported Standing balance-Leahy Scale: Fair                               Pertinent Vitals/Pain Pain Assessment Pain Assessment: Faces Faces Pain Scale: Hurts even more Pain Location: back and  tailbone with bed mobility Pain Descriptors / Indicators: Discomfort Pain Intervention(s): Limited activity within patient's tolerance, Monitored during session, Repositioned    Home Living Family/patient expects to be discharged to:: Private residence Living Arrangements: Alone Available Help at Discharge: Family;Available PRN/intermittently Type of Home:  Apartment Home Access: Level entry       Home Layout: One level Home Equipment: Rollator (4 wheels);Cane - single point;Shower seat Additional Comments: 2-3 L O2    Prior Function Prior Level of Function : Independent/Modified Independent             Mobility Comments: participates with [facility] programs since last admission-  go 5 x wk now 9am- 3pm. Mod I with cane for ambulation. walks a mile 3 days per week prior to first admission this month ADLs Comments: ADLs. one recent fall in the shower, along with 2 others from slipping on spilt vinegar and pineapple juice. Pt addressed fear of falling 2/2 to increase in recent falls. Pt's son anticipating taking FMLA to assist pt in the home with mobility.     Extremity/Trunk Assessment   Upper Extremity Assessment Upper Extremity Assessment:  (grossly WFL for tasks assessed)    Lower Extremity Assessment Lower Extremity Assessment: Generalized weakness       Communication   Communication Communication: Hearing impairment (hearing aides)  Cognition Arousal: Alert Behavior During Therapy: WFL for tasks assessed/performed Overall Cognitive Status: Within Functional Limits for tasks assessed                                          General Comments General comments (skin integrity, edema, etc.): patient educated on taking rest breaks as needed for energy conservation with mobility    Exercises     Assessment/Plan    PT Assessment Patient needs continued PT services  PT Problem List Decreased strength;Decreased range of motion;Decreased activity tolerance;Decreased balance;Decreased mobility;Pain;Cardiopulmonary status limiting activity       PT Treatment Interventions DME instruction;Gait training;Stair training;Functional mobility training;Therapeutic activities;Therapeutic exercise;Balance training;Neuromuscular re-education;Cognitive remediation;Patient/family education;Wheelchair mobility training    PT  Goals (Current goals can be found in the Care Plan section)  Acute Rehab PT Goals Patient Stated Goal: To return home safely PT Goal Formulation: With patient Time For Goal Achievement: 05/07/23 Potential to Achieve Goals: Good    Frequency Min 1X/week     Co-evaluation               AM-PAC PT "6 Clicks" Mobility  Outcome Measure Help needed turning from your back to your side while in a flat bed without using bedrails?: A Little Help needed moving from lying on your back to sitting on the side of a flat bed without using bedrails?: A Little Help needed moving to and from a bed to a chair (including a wheelchair)?: A Little Help needed standing up from a chair using your arms (e.g., wheelchair or bedside chair)?: A Little Help needed to walk in hospital room?: A Little Help needed climbing 3-5 steps with a railing? : A Lot 6 Click Score: 17    End of Session Equipment Utilized During Treatment: Gait belt;Back brace Activity Tolerance: Patient limited by fatigue;Patient tolerated treatment well Patient left: in bed;with call bell/phone within reach;with bed alarm set Nurse Communication: Mobility status PT Visit Diagnosis: Muscle weakness (generalized) (M62.81);Unsteadiness on feet (R26.81)    Time: 1035-1100 PT Time Calculation (min) (ACUTE ONLY): 25 min  Charges:   PT Evaluation $PT Eval Low Complexity: 1 Low PT Treatments $Therapeutic Activity: 8-22 mins PT General Charges $$ ACUTE PT VISIT: 1 Visit         Donna Bernard, PT, MPT   Ina Homes 04/23/2023, 11:58 AM

## 2023-04-23 NOTE — Progress Notes (Signed)
PROGRESS NOTE    [name]  RUE:[id number] DOB: Sep 26, 1945 DOA: 04/21/2023 PCP: [facility]    Assessment & Plan:   Principal Problem:   Recurrent right pleural effusion Active Problems:   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)   Hypothyroidism   Acute on chronic respiratory failure with hypoxia (HCC)   GERD without esophagitis   Paroxysmal atrial fibrillation (HCC)   Pneumonia  Assessment and Plan:  Recurrent right pleural effusion: likely secondary to recent dx of lymphoma. Pt has yet to receive treatment and still in staging process as per pt's family at bedside. S/p thoracentesis. Pleural fluid cx NGTD. Pleural fluid path is pending   Pneumonia: continue on IV rocephin, azithromycin, bronchodilators & start steroids. Encourage incentive spirometry. Tussionex prn for cough. 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  Hx of cirrhosis: as per pt's family. Restart lasix   Thrombocytopenia: likely secondary to cirrhosis. No need for a transfusion currently. Will continue to monitor   CKDIIIa: Cr is better than baseline    Hypothyroidism: continue on home dose of levothyroxine     PAF: continue on home dose of amio, eliquis    GERD: continue on PPI   Acute on chronic hypoxic respiratory failure: continue on supplemental oxygen and wean back to baseline as tolerated.       DVT prophylaxis: eliquis  Code Status: full  Family Communication: discussed pt's care w/ pt's son and answered his questions  Disposition Plan: likely d/c back home w/ HH   Level of care: Telemetry Medical  Status is: Inpatient Remains inpatient appropriate because: severity of illness      Consultants:    Procedures:   Antimicrobials: azithromycin, rocephin    Subjective: Pt c/o  shortness of breath   Objective: Vitals:   04/22/23 1607 04/22/23 1822 04/22/23 2114 04/23/23 0425  BP:  (!) 116/50 (!) 102/42 135/63  Pulse:  72 71 75  Resp:  (!) 22 20 18   Temp:  98.3 F (36.8 C) 98.1 F (36.7 C) (!) 97.4 F (36.3 C)  TempSrc:   Oral Oral  SpO2: 98% 97% 91% 90%  Weight:      Height:        Intake/Output Summary (Last 24 hours) at 04/23/2023 0826 Last data filed at 04/22/2023 1055 Gross per 24 hour  Intake 360 ml  Output --  Net 360 ml   Filed Weights   04/20/23 1949  Weight: 61.2 kg    Examination:  General exam: appears anxious  Respiratory system: decreased breath sounds b/l. Scattered wheezes b/l  Cardiovascular system: S1 & S2+. No rubs or clicks  Gastrointestinal system: abd is soft, NT, ND & hypoactive bowel sounds   Central nervous system: alert & oriented. Moves all extremities  Psychiatry: judgement and insight appears at baseline. Anxious mood and affect    Data Reviewed: I have personally reviewed following labs and imaging studies  CBC: Recent Labs  Lab 04/17/23 0423 04/20/23 2002 04/21/23 0529 04/22/23 0347 04/23/23 0520  WBC 7.0 10.2 10.1 6.1 5.7  NEUTROABS  --  7.2  --   --   --   HGB 11.1* 12.4 11.0* 10.3* 10.6*  HCT 33.5* 38.5 33.3* 31.2* 31.8*  MCV 91.5 93.7 92.2 92.0 91.6  PLT 137* 169 147* 142* 134*   Basic Metabolic Panel: Recent Labs  Lab 04/17/23 0423 04/20/23 2002 04/21/23 0529 04/22/23 0347 04/23/23 0520  NA 135 132* 134* 135 136  K 4.0 4.2 4.3 4.1 3.8  CL 103 96* 98 99 98  CO2 25 27 26 26 27   GLUCOSE 98 121* 128* 96 119*  BUN 16 19 16 13 11   CREATININE 0.62 0.90 0.72 0.66 0.61  CALCIUM 8.4* 8.3* 8.2* 8.1* 8.2*   GFR: Estimated Creatinine Clearance: 49.9 mL/min (by C-G formula based on SCr of 0.61 mg/dL). Liver Function Tests: Recent Labs  Lab 04/20/23 2002  AST 20  ALT 12  ALKPHOS 42  BILITOT 0.5  PROT 7.1  ALBUMIN 3.4*   No results for input(s): "LIPASE", "AMYLASE" in the last 168  hours. No results for input(s): "AMMONIA" in the last 168 hours. Coagulation Profile: No results for input(s): "INR", "PROTIME" in the last 168 hours. Cardiac Enzymes: No results for input(s): "CKTOTAL", "CKMB", "CKMBINDEX", "TROPONINI" in the last 168 hours. BNP (last 3 results) No results for input(s): "PROBNP" in the last 8760 hours. HbA1C: No results for input(s): "HGBA1C" in the last 72 hours. CBG: Recent Labs  Lab 04/19/23 1040  GLUCAP 119*   Lipid Profile: No results for input(s): "CHOL", "HDL", "LDLCALC", "TRIG", "CHOLHDL", "LDLDIRECT" in the last 72 hours. Thyroid Function Tests: No results for input(s): "TSH", "T4TOTAL", "FREET4", "T3FREE", "THYROIDAB" in the last 72 hours. Anemia Panel: No results for input(s): "VITAMINB12", "FOLATE", "FERRITIN", "TIBC", "IRON", "RETICCTPCT" in the last 72 hours. Sepsis Labs: Recent Labs  Lab 04/21/23 0152  LATICACIDVEN 1.3    Recent Results (from the past 240 hour(s))  Culture, blood (routine x 2)     Status: None (Preliminary result)   Collection Time: 04/21/23  1:47 AM   Specimen: BLOOD  Result Value Ref Range Status   Specimen Description BLOOD BLOOD RIGHT FOREARM  Final   Special Requests   Final    BOTTLES DRAWN AEROBIC AND ANAEROBIC Blood Culture adequate volume   Culture   Final    NO GROWTH 1 DAY Performed at [facility], [address], [place], [place] [address]    Report Status PENDING  Incomplete  Culture, blood (routine x 2)     Status: None (Preliminary result)   Collection Time: 04/21/23  1:51 AM   Specimen: BLOOD  Result Value Ref Range Status   Specimen Description BLOOD WEAKLY REACTIVE  Final   Special Requests   Final    BOTTLES DRAWN AEROBIC AND ANAEROBIC Blood Culture adequate volume   Culture   Final    NO GROWTH 1 DAY Performed at [facility], [address], [place], [place] [address]    Report Status PENDING  Incomplete  Body fluid culture w Gram Stain     Status: None  (Preliminary result)   Collection Time: 04/21/23  3:56 PM   Specimen: PATH Cytology Pleural fluid  Result Value Ref Range Status   Specimen Description   Final    PLEURAL Performed at [facility], [address], [place], [place] [address]    Special Requests   Final    NONE Performed at [facility], [address], [place], [place] [address]    Gram Stain   Final    WBC PRESENT, PREDOMINANTLY MONONUCLEAR NO ORGANISMS SEEN    Culture   Final    NO GROWTH 2 DAYS Performed at [facility], [address], [place], [place] [address]    Report Status PENDING  Incomplete  Radiology Studies: DG Chest Port 1 View  Result Date: 04/22/2023 CLINICAL DATA:  Shortness of breath EXAM: PORTABLE CHEST 1 VIEW COMPARISON:  04/21/2023 FINDINGS: Large right pleural effusion with right lower lobe atelectasis or infiltrate, similar to prior study. No confluent opacity on the left. Heart and mediastinal contours within normal limits. Aortic atherosclerosis. No acute bony abnormality. IMPRESSION: Large right pleural effusion with right lower lobe atelectasis or infiltrate, unchanged. Electronically Signed   By: Charlett Nose M.D.   On: 04/22/2023 19:19   DG Chest Port 1 View  Result Date: 04/21/2023 CLINICAL DATA:  Pleural effusion status post thoracentesis. EXAM: PORTABLE CHEST 1 VIEW COMPARISON:  04/20/2023. FINDINGS: Cardiac silhouette is obscured. Large right-sided pleural effusion. Left lung clear. Old healed adjacent left-sided posterior fifth and sixth rib fractures. Calcified aorta. Thoracic degenerative changes. No pneumothorax. IMPRESSION: Large right-sided pleural effusion. Electronically Signed   By: Layla Maw M.D.   On: 04/21/2023 18:16   US THORACENTESIS ASP PLEURAL SPACE W/IMG GUIDE  Result Date: 04/21/2023 INDICATION: Patient with history of COPD, lymphoma and recurrent right pleural effusion. Request for diagnostic and therapeutic right  thoracentesis EXAM: ULTRASOUND GUIDED RIGHT THORACENTESIS MEDICATIONS: 8 mL 1% lidocaine COMPLICATIONS: None immediate. PROCEDURE: An ultrasound guided thoracentesis was thoroughly discussed with the patient and questions answered. The benefits, risks, alternatives and complications were also discussed. The patient understands and wishes to proceed with the procedure. Written consent was obtained. Ultrasound was performed to localize and mark an adequate pocket of fluid in the right chest. Pleural effusion noted to be mildly loculated and the largest pocket was selected for drainage. The area was then prepped and draped in the normal sterile fashion. 1% Lidocaine was used for local anesthesia. Under ultrasound guidance a 6 Fr Safe-T-Centesis catheter was introduced. Thoracentesis was performed. The catheter was removed and a dressing applied. FINDINGS: A total of approximately 400 mL of serosanguineous fluid was removed. Samples were sent to the laboratory as requested by the clinical team. IMPRESSION: Successful ultrasound guided right thoracentesis yielding 400 mL of pleural fluid. Performed by Lynnette Caffey, PA-C Electronically Signed   By: Irish Lack M.D.   On: 04/21/2023 15:52        Scheduled Meds:  ALPRAZolam  0.25 mg Oral BID   amiodarone  100 mg Oral Daily   apixaban  2.5 mg Oral BID   ascorbic acid  1,500 mg Oral Daily   feeding supplement  237 mL Oral BID BM   ferrous sulfate  325 mg Oral Daily   levothyroxine  125 mcg Oral QAC breakfast   magnesium oxide  200 mg Oral Daily   omega-3 acid ethyl esters  1 capsule Oral Daily   pantoprazole  40 mg Oral Daily   polyvinyl alcohol  2 drop Both Eyes Daily   Continuous Infusions:  azithromycin 500 mg (04/22/23 2318)   cefTRIAXone (ROCEPHIN)  IV 2 g (04/22/23 2204)     LOS: 2 days       Charise Killian, MD [facility] Pager [phone]  If 7PM-7AM, please contact night-coverage [email or web address] 04/23/2023, 8:26  AM

## 2023-04-24 ENCOUNTER — Encounter: Payer: Self-pay | Admitting: Internal Medicine

## 2023-04-24 DIAGNOSIS — J189 Pneumonia, unspecified organism: Secondary | ICD-10-CM | POA: Diagnosis not present

## 2023-04-24 DIAGNOSIS — R0602 Shortness of breath: Principal | ICD-10-CM

## 2023-04-24 DIAGNOSIS — D72820 Lymphocytosis (symptomatic): Secondary | ICD-10-CM | POA: Diagnosis not present

## 2023-04-24 DIAGNOSIS — C8590 Non-Hodgkin lymphoma, unspecified, unspecified site: Secondary | ICD-10-CM | POA: Diagnosis not present

## 2023-04-24 DIAGNOSIS — J90 Pleural effusion, not elsewhere classified: Secondary | ICD-10-CM | POA: Diagnosis not present

## 2023-04-24 LAB — CBC
HCT: 31.1 % — ABNORMAL LOW (ref 36.0–46.0)
Hemoglobin: 10.2 g/dL — ABNORMAL LOW (ref 12.0–15.0)
MCH: 30.4 pg (ref 26.0–34.0)
MCHC: 32.8 g/dL (ref 30.0–36.0)
MCV: 92.8 fL (ref 80.0–100.0)
Platelets: 163 10*3/uL (ref 150–400)
RBC: 3.35 MIL/uL — ABNORMAL LOW (ref 3.87–5.11)
RDW: 12.9 % (ref 11.5–15.5)
WBC: 7.5 10*3/uL (ref 4.0–10.5)
nRBC: 0 % (ref 0.0–0.2)

## 2023-04-24 LAB — BASIC METABOLIC PANEL
Anion gap: 9 (ref 5–15)
BUN: 17 mg/dL (ref 8–23)
CO2: 30 mmol/L (ref 22–32)
Calcium: 8.4 mg/dL — ABNORMAL LOW (ref 8.9–10.3)
Chloride: 96 mmol/L — ABNORMAL LOW (ref 98–111)
Creatinine, Ser: 0.79 mg/dL (ref 0.44–1.00)
GFR, Estimated: >60 mL/min (ref >60)
Glucose, Bld: 200 mg/dL — ABNORMAL HIGH (ref 70–99)
Potassium: 4.1 mmol/L (ref 3.5–5.1)
Sodium: 135 mmol/L (ref 135–145)

## 2023-04-24 MED ORDER — IPRATROPIUM-ALBUTEROL 0.5-2.5 (3) MG/3ML IN SOLN
3.0000 mL | Freq: Three times a day (TID) | RESPIRATORY_TRACT | Status: DC
  Administered 2023-04-25: 3 mL via RESPIRATORY_TRACT
  Filled 2023-04-24: qty 3

## 2023-04-24 MED ORDER — FLEET ENEMA RE ENEM
1.0000 | ENEMA | Freq: Once | RECTAL | Status: AC
Start: 2023-04-24 — End: 2023-04-24
  Administered 2023-04-24: 1 via RECTAL

## 2023-04-24 MED ORDER — METHYLPREDNISOLONE SODIUM SUCC 40 MG IJ SOLR
40.0000 mg | Freq: Three times a day (TID) | INTRAMUSCULAR | Status: DC
Start: 2023-04-24 — End: 2023-04-26
  Administered 2023-04-24 – 2023-04-26 (×7): 40 mg via INTRAVENOUS
  Filled 2023-04-24 (×7): qty 1

## 2023-04-24 MED ORDER — ALBUTEROL SULFATE (2.5 MG/3ML) 0.083% IN NEBU: 2.5000 mg | INHALATION_SOLUTION | RESPIRATORY_TRACT | Status: DC | PRN

## 2023-04-24 MED ORDER — ADULT MULTIVITAMIN W/MINERALS CH
1.0000 | ORAL_TABLET | Freq: Every day | ORAL | Status: DC
  Administered 2023-04-25 – 2023-04-28 (×4): 1 via ORAL
  Filled 2023-04-24 (×4): qty 1

## 2023-04-24 MED ORDER — IPRATROPIUM-ALBUTEROL 0.5-2.5 (3) MG/3ML IN SOLN
3.0000 mL | RESPIRATORY_TRACT | Status: DC
  Administered 2023-04-24 (×3): 3 mL via RESPIRATORY_TRACT
  Filled 2023-04-24 (×3): qty 3

## 2023-04-24 MED ORDER — ENSURE ENLIVE PO LIQD
237.0000 mL | Freq: Three times a day (TID) | ORAL | Status: DC
  Administered 2023-04-25 – 2023-04-27 (×9): 237 mL via ORAL

## 2023-04-24 NOTE — Consult Note (Signed)
Hematology/Oncology Consult note [phone] [phone]      Patient Care Team: [facility] as PCP - Buren Kos, MD as Consulting Physician (Oncology)   Name of the patient: [name]  [id number]  [birth year]   REASON FOR COSULTATION:   History of presenting illness-  77 y.o. female with PMH listed at below who presents to ER for evaluation of shortness of breath. Patient has a history of COPD/asthma.  She has a recent history of recurrent pleural effusion, status post thoracentesis.  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 She has underwent PET scan which showed lymphadenopathy or soft tissue mass. She has noticed that her oxygen requirement has increased from 3 L to 5 L. Denies fever/chills, abdominal pain, nausea, vomiting or dizziness.   04/21/2023, chest x-ray showed large right-sided pleural effusion Patient was admitted for acute on chronic respiratory failure due to recurrent pleural effusion, and possible pneumonia.  Patient is receiving IV Rocephin, azithromycin.  Oncology was consulted for further evaluation and management. Patient reports that she continues to have shortness of breath.     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    Priority: Medium    Mood disorder (HCC) 04/13/2023    Priority: Medium    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)  04/12/2023    Priority: Medium    Recurrent right pleural effusion 04/21/2023   Acute on chronic respiratory failure with hypoxia (HCC) 04/21/2023   GERD without esophagitis 04/21/2023   Paroxysmal atrial fibrillation (HCC) 04/21/2023   Pneumonia 04/21/2023   Anxiety 04/17/2023   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   Sacral fracture, closed (HCC) 04/15/2023   Fall 04/14/2023   Lumbar compression fracture (HCC) 04/14/2023   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   Chronic kidney disease, stage 3a (HCC) 04/07/2023   Atrial fibrillation, chronic (HCC) 04/07/2023   DVT (deep venous thrombosis) (HCC) 04/07/2023   Leukocytosis 04/07/2023   Right foot pain 04/07/2023   Acute sinusitis 05/23/2022   COPD exacerbation (HCC) 05/22/2022   Atrial fibrillation with RVR (HCC) 05/22/2022   Chest pain 05/22/2022   HTN (hypertension) 05/22/2022   Diabetes mellitus without complication (HCC) 05/22/2022   COPD (chronic obstructive pulmonary disease) (HCC) 05/22/2022   Hypothyroidism 05/22/2022   Iron deficiency anemia 05/22/2022   Osteoarthritis of right knee 03/14/2022     Past Medical History:  Diagnosis Date   Anemia    Asthma    COPD (chronic obstructive pulmonary disease) (HCC)    Diabetes mellitus without complication (HCC)    type 2   DVT (deep venous thrombosis) (HCC) 1980   GERD (gastroesophageal reflux disease)    History of hiatal hernia    Hypertension    Hypothyroidism      Past Surgical History:  Procedure Laterality Date   CATARACT EXTRACTION Bilateral    CHOLECYSTECTOMY  COLONOSCOPY     ESOPHAGOGASTRODUODENOSCOPY (EGD) WITH PROPOFOL N/A 01/17/2022   Procedure: ESOPHAGOGASTRODUODENOSCOPY (EGD) WITH PROPOFOL;  Surgeon: Jaynie Collins, DO;  Location: [facility];  Service: Gastroenterology;  Laterality: N/A;   HUMERUS SURGERY Left    fracture (7 surgeries total)   MENISCUS REPAIR Right 2019   THYROIDECTOMY     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 toe; reconstruction from hip bone   TONSILECTOMY, ADENOIDECTOMY, BILATERAL MYRINGOTOMY AND TUBES     TOTAL KNEE ARTHROPLASTY Right 03/14/2022   Procedure: TOTAL KNEE ARTHROPLASTY;  Surgeon: Reinaldo Berber, MD;  Location: [facility] ORS;  Service: Orthopedics;  Laterality: Right;   TOTAL SHOULDER REPLACEMENT Left    TUBAL LIGATION     WRIST SURGERY Left    multiple fractures from falls (14 surgeries from wrist to shoulder)    Social History   Socioeconomic History   Marital status: Widowed    Spouse name: Not on file   Number of children: 6   Years of education: Not on file   Highest education level: Not on file  Occupational History   Not on file  Tobacco Use   Smoking status: Never   Smokeless tobacco: Never  Vaping Use   Vaping status: Never Used  Substance and Sexual Activity   Alcohol use: Never   Drug use: Never   Sexual activity: Not Currently  Other Topics Concern   Not on file  Social History Narrative   Lives in senior housing in [place] (apt.)   Social Determinants of Health   Financial Resource Strain: Not on file  Food Insecurity: No Food Insecurity (04/21/2023)   Hunger Vital Sign    Worried About Running Out of Food in the Last Year: Never true    Ran Out of Food in the Last Year: Never true  Transportation Needs: No Transportation Needs (04/21/2023)   PRAPARE - Administrator, Civil Service (Medical): No    Lack of Transportation (Non-Medical): No  Physical Activity: Not on file  Stress: Not on file  Social Connections: Not on file  Intimate Partner Violence: Not At Risk (04/21/2023)   Humiliation, Afraid, Rape, and Kick questionnaire    Fear of Current or Ex-Partner: No    Emotionally Abused: No    Physically Abused: No    Sexually Abused: No     Family History  Problem Relation Age of Onset   Kidney disease Mother    Tuberculosis Father      Current Facility-Administered Medications:    acetaminophen  (TYLENOL) tablet 650 mg, 650 mg, Oral, Q6H PRN **OR** acetaminophen (TYLENOL) suppository 650 mg, 650 mg, Rectal, Q6H PRN, Mansy, Jan A, MD   ALPRAZolam Prudy Feeler) tablet 0.25 mg, 0.25 mg, Oral, BID, Mansy, Jan A, MD, 0.25 mg at 04/24/23 1011   amiodarone (PACERONE) tablet 100 mg, 100 mg, Oral, Daily, Mansy, Jan A, MD, 100 mg at 04/24/23 1010   apixaban (ELIQUIS) tablet 2.5 mg, 2.5 mg, Oral, BID, Mansy, Jan A, MD, 2.5 mg at 04/24/23 1011   ascorbic acid (VITAMIN C) tablet 1,500 mg, 1,500 mg, Oral, Daily, Mansy, Jan A, MD, 1,500 mg at 04/24/23 1010   azithromycin (ZITHROMAX) 500 mg in dextrose 5 % 250 mL IVPB, 500 mg, Intravenous, Q24H, Mansy, Jan A, MD, Stopped at 04/24/23 0154   bismuth subsalicylate (PEPTO BISMOL) 262 MG/15ML suspension 30 mL, 30 mL, Oral, Q6H PRN, Mansy, Jan A, MD   cefTRIAXone (ROCEPHIN) 2 g in sodium chloride 0.9 % 100 mL IVPB,  2 g, Intravenous, Q24H, Mansy, Jan A, MD, Stopped at 04/23/23 2307   diclofenac Sodium (VOLTAREN) 1 % topical gel 2 g, 2 g, Topical, QID PRN, Mansy, Jan A, MD   feeding supplement (ENSURE ENLIVE / ENSURE PLUS) liquid 237 mL, 237 mL, Oral, TID BM, Charise Killian, MD   fentaNYL (DURAGESIC) 12 MCG/HR 1 patch, 1 patch, Transdermal, Q72H, Charise Killian, MD, 1 patch at 04/23/23 1203   ferrous sulfate tablet 325 mg, 325 mg, Oral, Daily, Mansy, Jan A, MD, 325 mg at 04/24/23 1010   fluticasone (FLONASE) 50 MCG/ACT nasal spray 2 spray, 2 spray, Each Nare, Daily PRN, Mansy, Jan A, MD   furosemide (LASIX) injection 40 mg, 40 mg, Intravenous, Daily, Charise Killian, MD, 40 mg at 04/24/23 1010   guaiFENesin (MUCINEX) 12 hr tablet 600 mg, 600 mg, Oral, BID PRN, Mansy, Jan A, MD, 600 mg at 04/23/23 2258   HYDROcodone-acetaminophen (NORCO/VICODIN) 5-325 MG per tablet 1-2 tablet, 1-2 tablet, Oral, Q6H PRN, Charise Killian, MD, 2 tablet at 04/23/23 2259   ipratropium-albuterol (DUONEB) 0.5-2.5 (3) MG/3ML nebulizer solution 3 mL, 3 mL, Nebulization, Q4H,  Charise Killian, MD, 3 mL at 04/24/23 1500   levothyroxine (SYNTHROID) tablet 125 mcg, 125 mcg, Oral, QAC breakfast, Mansy, Jan A, MD, 125 mcg at 04/24/23 0601   loratadine (CLARITIN) tablet 10 mg, 10 mg, Oral, Daily PRN, Mansy, Jan A, MD   magnesium hydroxide (MILK OF MAGNESIA) suspension 30 mL, 30 mL, Oral, Daily PRN, Mansy, Jan A, MD, 30 mL at 04/24/23 1407   magnesium oxide (MAG-OX) tablet 200 mg, 200 mg, Oral, Daily, Mansy, Jan A, MD, 200 mg at 04/24/23 1011   methylPREDNISolone sodium succinate (SOLU-MEDROL) 40 mg/mL injection 40 mg, 40 mg, Intravenous, Q8H, Charise Killian, MD, 40 mg at 04/24/23 1404   morphine (PF) 2 MG/ML injection 2 mg, 2 mg, Intravenous, Q4H PRN, Charise Killian, MD, 2 mg at 04/22/23 1739   [START ON 04/25/2023] multivitamin with minerals tablet 1 tablet, 1 tablet, Oral, Daily, Charise Killian, MD   nitroGLYCERIN (NITROSTAT) SL tablet 0.4 mg, 0.4 mg, Sublingual, Q5 min PRN, Mansy, Jan A, MD   olopatadine (PATANOL) 0.1 % ophthalmic solution 1 drop, 1 drop, Both Eyes, Daily PRN, Mansy, Jan A, MD   omega-3 acid ethyl esters (LOVAZA) capsule 1 g, 1 capsule, Oral, Daily, Mansy, Jan A, MD, 1 g at 04/24/23 1012   ondansetron (ZOFRAN) tablet 4 mg, 4 mg, Oral, Q6H PRN **OR** ondansetron (ZOFRAN) injection 4 mg, 4 mg, Intravenous, Q6H PRN, Mansy, Jan A, MD, 4 mg at 04/21/23 1419   pantoprazole (PROTONIX) EC tablet 40 mg, 40 mg, Oral, Daily, Mansy, Jan A, MD, 40 mg at 04/24/23 1012   polyvinyl alcohol (LIQUIFILM TEARS) 1.4 % ophthalmic solution 2 drop, 2 drop, Both Eyes, Daily, Mansy, Jan A, MD, 2 drop at 04/24/23 1012   sodium chloride (OCEAN) 0.65 % nasal spray 1 spray, 1 spray, Each Nare, PRN, Mansy, Jan A, MD   traZODone (DESYREL) tablet 50 mg, 50 mg, Oral, QHS PRN, Mansy, Jan A, MD, 50 mg at 04/23/23 2259  Review of Systems  Constitutional:  Positive for fatigue. Negative for appetite change, chills and fever.  HENT:   Negative for hearing loss and voice  change.   Eyes:  Negative for eye problems.  Respiratory:  Positive for shortness of breath. Negative for chest tightness and cough.   Cardiovascular:  Negative for chest pain.  Gastrointestinal:  Negative for abdominal  distention and abdominal pain.  Endocrine: Negative for hot flashes.  Genitourinary:  Negative for difficulty urinating and frequency.   Musculoskeletal:  Negative for arthralgias.  Skin:  Negative for itching and rash.  Neurological:  Negative for extremity weakness.  Hematological:  Negative for adenopathy.  Psychiatric/Behavioral:  Negative for confusion. The patient is nervous/anxious.     PHYSICAL EXAM Vitals:   04/24/23 0208 04/24/23 0833 04/24/23 1244 04/24/23 1500  BP: 135/62 (!) 133/91    Pulse: 68 73    Resp: 18 16    Temp: 98.3 F (36.8 C) 97.6 F (36.4 C)    TempSrc:  Oral    SpO2: 95% 95% 96% 96%  Weight:      Height:       Physical Exam Constitutional:      General: She is not in acute distress.    Appearance: She is not diaphoretic.  HENT:     Head: Normocephalic and atraumatic.  Eyes:     General: No scleral icterus. Cardiovascular:     Rate and Rhythm: Normal rate.     Heart sounds: No murmur heard. Pulmonary:     Effort: Pulmonary effort is normal. No respiratory distress.     Comments: Decreased breath sound the right side Abdominal:     General: There is no distension.  Musculoskeletal:        General: Normal range of motion.     Cervical back: Normal range of motion and neck supple.  Skin:    Findings: No erythema.  Neurological:     Mental Status: She is alert and oriented to person, place, and time.     Cranial Nerves: No cranial nerve deficit.     Motor: No abnormal muscle tone.     Coordination: Coordination normal.  Psychiatric:        Mood and Affect: Affect normal.     Comments: Anxious       LABORATORY STUDIES    Latest Ref Rng & Units 04/24/2023    4:34 AM 04/23/2023    5:20 AM 04/22/2023    3:47 AM  CBC   WBC 4.0 - 10.5 K/uL 7.5  5.7  6.1   Hemoglobin 12.0 - 15.0 g/dL 43.3  29.5  18.8   Hematocrit 36.0 - 46.0 % 31.1  31.8  31.2   Platelets 150 - 400 K/uL 163  134  142       Latest Ref Rng & Units 04/24/2023    4:34 AM 04/23/2023    5:20 AM 04/22/2023    3:47 AM  CMP  Glucose 70 - 99 mg/dL 416  606  96   BUN 8 - 23 mg/dL 17  11  13    Creatinine 0.44 - 1.00 mg/dL 3.01  6.01  0.93   Sodium 135 - 145 mmol/L 135  136  135   Potassium 3.5 - 5.1 mmol/L 4.1  3.8  4.1   Chloride 98 - 111 mmol/L 96  98  99   CO2 22 - 32 mmol/L 30  27  26    Calcium 8.9 - 10.3 mg/dL 8.4  8.2  8.1      RADIOGRAPHIC STUDIES: I have personally reviewed the radiological images as listed and agreed with the findings in the report. NM PET Image Initial (PI) Skull Base To Thigh  Result Date: 04/24/2023 CLINICAL DATA:  Initial treatment strategy for weight loss, monoclonal B-cell lymphocytosis, and right pleural effusion. EXAM: NUCLEAR MEDICINE PET SKULL BASE TO THIGH TECHNIQUE: 7.0 mCi  F-18 FDG was injected intravenously. Full-ring PET imaging was performed from the skull base to thigh after the radiotracer. CT data was obtained and used for attenuation correction and anatomic localization. Fasting blood glucose: 119 mg/dl COMPARISON:  CT chest abdomen pelvis dated 04/14/2023 FINDINGS: Mediastinal blood pool activity: SUV max 2.6 Liver activity: SUV max NA NECK: No hypermetabolic lymph nodes in the neck. Incidental CT findings: None. CHEST: No hypermetabolic mediastinal or hilar nodes. No suspicious pulmonary nodules on the CT scan. Moderate right pleural effusion without convincing nodularity or hypermetabolism. Right middle and right lower lobe compressive atelectasis. Incidental CT findings: Atherosclerotic calcifications of the aortic arch. Mitral valve annular calcifications. Mild coronary atherosclerosis of the LAD. ABDOMEN/PELVIS: No abnormal hypermetabolic activity within the liver, pancreas, adrenal glands, or  spleen. No hypermetabolic lymph nodes in the abdomen or pelvis. Nodular hepatic contour, suggesting cirrhosis. Mild diffuse hypermetabolism in the gastric antrum, max SUV 5.6, favored to be reactive or less likely reflecting gastritis. However, there is mild polypoid soft tissue prominence at the pylorus on CT (series 6/image 88), equivocal. Consider direct visualization as clinically warranted. Incidental CT findings: Status post cholecystectomy. Atherosclerotic calcifications of the abdominal aorta and branch vessels. Left extrarenal pelvis. Sigmoid diverticulosis, without evidence of diverticulitis. SKELETON: No focal hypermetabolic activity to suggest skeletal metastasis. Incidental CT findings: Status post left shoulder arthroplasty with posttraumatic deformity involving the distal humerus. Degenerative changes of the visualized thoracolumbar spine. IMPRESSION: No findings specific for malignancy. No suspicious lymphadenopathy in this patient with B-cell lymphocytosis. 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 Possible distal gastritis. Equivocal polypoid soft tissue thickening at the pylorus, consider upper endoscopy for direct visualization as clinically warranted. Electronically Signed   By: Charline Bills M.D.   On: 04/24/2023 10:06   DG Chest Port 1 View  Result Date: 04/22/2023 CLINICAL DATA:  Shortness of breath EXAM: PORTABLE CHEST 1 VIEW COMPARISON:  04/21/2023 FINDINGS: Large right pleural effusion with right lower lobe atelectasis or infiltrate, similar to prior study. No confluent opacity on the left. Heart and mediastinal contours within normal limits. Aortic atherosclerosis. No acute bony abnormality. IMPRESSION: Large right pleural effusion with right lower lobe atelectasis or infiltrate, unchanged. Electronically Signed   By: Charlett Nose M.D.   On: 04/22/2023 19:19   DG Chest Port 1 View  Result Date: 04/21/2023 CLINICAL DATA:  Pleural effusion status post  thoracentesis. EXAM: PORTABLE CHEST 1 VIEW COMPARISON:  04/20/2023. FINDINGS: Cardiac silhouette is obscured. Large right-sided pleural effusion. Left lung clear. Old healed adjacent left-sided posterior fifth and sixth rib fractures. Calcified aorta. Thoracic degenerative changes. No pneumothorax. IMPRESSION: Large right-sided pleural effusion. Electronically Signed   By: Layla Maw M.D.   On: 04/21/2023 18:16   US THORACENTESIS ASP PLEURAL SPACE W/IMG GUIDE  Result Date: 04/21/2023 INDICATION: Patient with history of COPD, lymphoma and recurrent right pleural effusion. Request for diagnostic and therapeutic right thoracentesis EXAM: ULTRASOUND GUIDED RIGHT THORACENTESIS MEDICATIONS: 8 mL 1% lidocaine COMPLICATIONS: None immediate. PROCEDURE: An ultrasound guided thoracentesis was thoroughly discussed with the patient and questions answered. The benefits, risks, alternatives and complications were also discussed. The patient understands and wishes to proceed with the procedure. Written consent was obtained. Ultrasound was performed to localize and mark an adequate pocket of fluid in the right chest. Pleural effusion noted to be mildly loculated and the largest pocket was selected for drainage. The area was then prepped and draped in the normal sterile fashion. 1% Lidocaine was used for local anesthesia. Under ultrasound guidance a  6 Fr Safe-T-Centesis catheter was introduced. Thoracentesis was performed. The catheter was removed and a dressing applied. FINDINGS: A total of approximately 400 mL of serosanguineous fluid was removed. Samples were sent to the laboratory as requested by the clinical team. IMPRESSION: Successful ultrasound guided right thoracentesis yielding 400 mL of pleural fluid. Performed by Lynnette Caffey, PA-C Electronically Signed   By: Irish Lack M.D.   On: 04/21/2023 15:52   DG Chest Port 1 View  Result Date: 04/20/2023 CLINICAL DATA:  Shortness of breath EXAM: PORTABLE  CHEST 1 VIEW COMPARISON:  Radiographs 04/12/2023 and PET/CT 04/19/2023 FINDINGS: Moderate-large right pleural effusion and associated airspace opacities similar to PET/CT 04/19/2023 given differences in technique. The left lung is clear. Stable cardiomediastinal silhouette. Aortic atherosclerotic calcification. Loop recorder. Left reverse TSA. IMPRESSION: Moderate-large right pleural effusion and associated airspace opacities. Electronically Signed   By: Minerva Fester M.D.   On: 04/20/2023 22:38   CT L-SPINE NO CHARGE  Result Date: 04/14/2023 CLINICAL DATA:  77 year old female fell, found down. On blood thinners. Head hematoma. EXAM: CT LUMBAR SPINE WITHOUT CONTRAST TECHNIQUE: Technique: Multiplanar CT images of the lumbar spine were reconstructed from contemporary CT of the Abdomen and Pelvis. RADIATION DOSE REDUCTION: This exam was performed according to the departmental dose-optimization program which includes automated exposure control, adjustment of the mA and/or kV according to patient size and/or use of iterative reconstruction technique. CONTRAST:  None COMPARISON:  CT thoracic, CT Chest, Abdomen, and Pelvis today reported separately. FINDINGS: Segmentation: Normal. Alignment: Mild dextroconvex upper and levoconvex lower lumbar scoliosis. Straightening of lordosis. No significant spondylolisthesis. Vertebrae: Diffuse osteopenia. L1, L2, and L3 vertebrae appear intact. Mild L4 superior endplate compression (sagittal image 64, it is eccentric to the right (coronal image 48). L4 vertebra otherwise intact. L5 vertebra appears intact. Superior sacrum and SI joints appear intact. But there is a subtle, minimally displaced fracture of the lower sacrum in the midline S5 (sagittal image 62). Paraspinal and other soft tissues: Abdomen and pelvis are detailed separately. Lumbar paraspinal soft tissues are within normal limits. Disc levels: Lumbar spine degeneration with multilevel vacuum disc. Moderate to severe  disc space loss with disc osteophyte disease L2-L3 through L3-L4. Estimated lumbar spinal stenosis by CT as follows L2-L3: Mild to moderate, and with lateral recess stenosis greater on the left. L3-L4:  Mild-to-moderate. L4-L5:  Moderate to severe (series 2, image 82). IMPRESSION: 1. Osteopenia. Positive for a minimally displaced acute fracture of the lower sacrum at S5. And a mild L4 superior endplate compression fracture is age indeterminate. 2. Advanced lumbar spine disc and endplate degeneration contributing to multilevel spinal stenosis which is moderate to severe at L4-L5 and mild to moderate at L2-L3 and L3-L4. 3. CT Abdomen and Pelvis today reported separately. Electronically Signed   By: Odessa Fleming M.D.   On: 04/14/2023 07:19   CT T-SPINE NO CHARGE  Result Date: 04/14/2023 CLINICAL DATA:  77 year old female fell, found down. On blood thinners. Head hematoma. EXAM: CT THORACIC SPINE WITHOUT CONTRAST TECHNIQUE: Multiplanar CT images of the thoracic spine were reconstructed from contemporary CT of the Chest. RADIATION DOSE REDUCTION: This exam was performed according to the departmental dose-optimization program which includes automated exposure control, adjustment of the mA and/or kV according to patient size and/or use of iterative reconstruction technique. CONTRAST:  None COMPARISON:  CT cervical spine, CT Chest, Abdomen, and Pelvis today reported separately. Recent chest CT 04/07/2023. FINDINGS: Limited cervical spine imaging:  Reported separately. Thoracic spine segmentation: Appears normal; hypoplastic left  12th rib. Alignment: Stable thoracic kyphosis from earlier this month. No significant scoliosis or spondylolisthesis. Vertebrae: Chronic osteopenia. Subtle loss of height or compression at T5, T6, T11 is unchanged from earlier this month. No acute thoracic vertebral fracture identified. Visible posterior ribs appear intact. Paraspinal and other soft tissues: Chest and abdomen are detailed separately.  Thoracic paraspinal soft tissues remain within normal limits. Disc levels: Thoracic spine degeneration although mostly anterior flowing endplate osteophytes. Partially calcified disc and/or endplate spurring at T10-T11 with evidence of mild chronic thoracic spinal stenosis there. Superimposed facet hypertrophy. Spinal stenosis could be mild or moderate. IMPRESSION: 1. Stable CT appearance of the Thoracic Spine. Osteopenia, No acute traumatic injury identified. 2. 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 3. CT Chest, Abdomen, and Pelvis today reported separately. Electronically Signed   By: Odessa Fleming M.D.   On: 04/14/2023 07:14   CT CHEST ABDOMEN PELVIS WO CONTRAST  Result Date: 04/14/2023 CLINICAL DATA:  77 year old female fell, found down. On blood thinners. Hematoma. EXAM: CT CHEST, ABDOMEN AND PELVIS WITHOUT CONTRAST TECHNIQUE: Multidetector CT imaging of the chest, abdomen and pelvis was performed following the standard protocol without IV contrast. RADIATION DOSE REDUCTION: This exam was performed according to the departmental dose-optimization program which includes automated exposure control, adjustment of the mA and/or kV according to patient size and/or use of iterative reconstruction technique. COMPARISON:  Thoracic and lumbar CT today reported separately. Recent chest CT 04/07/2023. FINDINGS: CT CHEST FINDINGS Cardiovascular: Calcified aortic atherosclerosis. Calcified coronary artery atherosclerosis. Stable noncontrast appearance of the heart and aorta. No cardiomegaly or pericardial effusion. Mediastinum/Nodes: Stable noncontrast mediastinum. No evidence of mass, lymphadenopathy, mediastinal hematoma. Lungs/Pleura: Layering right pleural effusion with simple fluid density remains moderate to large, slightly decreased compared to 04/07/2023. No contralateral left pleural effusion. Major airways remain patent. Compressive right lung atelectasis. Right lower lobe ventilation has  mildly improved. No pneumothorax. Musculoskeletal: Left shoulder arthroplasty. Chronic posttraumatic and/or degenerative changes at the right shoulder. Chronic left rib fractures. Thoracic spine is reported separately. No sternal fracture. CT ABDOMEN PELVIS FINDINGS Hepatobiliary: Nodular liver contour suggestive of cirrhosis (series 2, image 69). No discrete liver lesion in the absence of IV contrast. Cholecystectomy. Pancreas: Negative. Spleen: Negative, no splenomegaly.  No perisplenic fluid. Adrenals/Urinary Tract: Negative noncontrast adrenal glands, kidneys, bladder. Pelvic phleboliths. Stomach/Bowel: Postoperative changes to the right ventral abdominal wall, probably chronic cholecystectomy related. Small fat containing umbilical hernia series 2, image 83. Nearby ventral abdominal wall subcutaneous fat stranding which is probably subcutaneous injection sites (series 2, image 93). No herniated bowel. No dilated large or small bowel. Severe diverticulosis of the sigmoid colon in the pelvis. No active large bowel inflammation. Appendicoliths on series 2, image 93, but otherwise normal appendix (coronal image 44). Decompressed terminal ileum. Decompressed stomach. No free air or free fluid. No mesenteric inflammation identified. Vascular/Lymphatic: Extensive Aortoiliac calcified atherosclerosis. Vascular patency is not evaluated in the absence of IV contrast. Normal caliber abdominal aorta. No lymphadenopathy identified. Reproductive: Retroverted, otherwise negative noncontrast appearance. Other: No pelvis free fluid. Musculoskeletal: Lumbar spine is detailed separately. Sacrum, SI joints, pelvis, and proximal femurs appear intact. No superficial soft tissue injury identified. IMPRESSION: 1. No acute traumatic injury identified in the noncontrast chest, abdomen, or pelvis. See CT thoracic and lumbar spine reported separately. 2. Moderate to large layering right pleural effusion has slightly decreased compared to  04/07/2023. Simple fluid density favors transudate (see #3). Compressive right lung atelectasis. 3. Nodular liver contour strongly suggests Cirrhosis. But no ascites, no splenomegaly.  4.  Aortic Atherosclerosis (ICD10-I70.0). Electronically Signed   By: Odessa Fleming M.D.   On: 04/14/2023 07:09   CT Cervical Spine Wo Contrast  Result Date: 04/14/2023 CLINICAL DATA:  77 year old female fell, found down. On blood thinners. Hematoma. EXAM: CT CERVICAL SPINE WITHOUT CONTRAST TECHNIQUE: Multidetector CT imaging of the cervical spine was performed without intravenous contrast. Multiplanar CT image reconstructions were also generated. RADIATION DOSE REDUCTION: This exam was performed according to the departmental dose-optimization program which includes automated exposure control, adjustment of the mA and/or kV according to patient size and/or use of iterative reconstruction technique. COMPARISON:  Head CT today.  Cervical spine CT 03/05/2023. FINDINGS: Alignment: Straightening of cervical lordosis. Cervicothoracic junction alignment is within normal limits. Bilateral posterior element alignment is within normal limits. Skull base and vertebrae: Visualized skull base is intact. No atlanto-occipital dissociation. C1 and C2 appear intact and aligned. No acute osseous abnormality identified. Soft tissues and spinal canal: No prevertebral fluid or swelling. No visible canal hematoma. Partially retropharyngeal carotids. Calcified atherosclerosis. Mild pharynx motion artifact. Disc levels: Stable cervical spine degeneration. Multilevel disc and endplate degeneration with some ligamentous hypertrophy. 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 Upper chest: Thoracic spine and chest CT today reported separately. IMPRESSION: 1. No acute traumatic injury identified in the cervical spine. 2. 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 Electronically Signed   By: Odessa Fleming M.D.   On:  04/14/2023 06:59   CT HEAD WO CONTRAST ( )  Result Date: 04/14/2023 CLINICAL DATA:  77 year old female fell, found down. On blood thinners. Head hematoma. EXAM: CT HEAD WITHOUT CONTRAST TECHNIQUE: Contiguous axial images were obtained from the base of the skull through the vertex without intravenous contrast. RADIATION DOSE REDUCTION: This exam was performed according to the departmental dose-optimization program which includes automated exposure control, adjustment of the mA and/or kV according to patient size and/or use of iterative reconstruction technique. COMPARISON:  Head CT 03/05/2023. FINDINGS: Brain: Cerebral volume is stable, within normal limits for age. No midline shift, ventriculomegaly, mass effect, evidence of mass lesion, intracranial hemorrhage or evidence of cortically based acute infarction. Stable mild for age patchy white matter hypodensity. Otherwise normal gray-white differentiation. Vascular: Calcified atherosclerosis at the skull base. No suspicious intracranial vascular hyperdensity. Skull: Stable and intact. Sinuses/Orbits: Visualized paranasal sinuses and mastoids are stable and well aerated. Other: There is a mild right posterior convexity scalp hematoma or contusion on series 3, image 35. Underlying calvarium appears intact. No other acute orbit or scalp soft tissue injury identified. IMPRESSION: 1. Right posterior convexity scalp hematoma or contusion. No skull fracture. 2. No acute intracranial abnormality. Stable and largely negative for age non contrast CT appearance of the brain. Electronically Signed   By: Odessa Fleming M.D.   On: 04/14/2023 06:55   DG Chest Port 1 View  Result Date: 04/12/2023 CLINICAL DATA:  Cough, shortness of breath EXAM: PORTABLE CHEST 1 VIEW COMPARISON:  04/11/2023 FINDINGS: Heart size is normal. Dense mitral annulus calcifications. Benign calcified granulomatous mediastinal lymph nodes. Implantable loop recorder. No acute airspace opacity. Benign  calcified nodule specific status post left shoulder reverse arthroplasty. IMPRESSION: No acute abnormality of the lungs in AP portable projection. Electronically Signed   By: Jearld Lesch M.D.   On: 04/12/2023 09:10   DG Chest Port 1 View  Result Date: 04/11/2023 CLINICAL DATA:  Pleural effusion. EXAM: PORTABLE CHEST 1 VIEW COMPARISON:  April 10, 2023. FINDINGS: The heart size and mediastinal contours are within normal limits. Status  post left shoulder arthroplasty. Old left rib fractures are noted. Increased right basilar opacity is noted concerning for worsening effusion and associated atelectasis or infiltrate. Left lung is unremarkable. IMPRESSION: Increased right basilar opacity is noted concerning for worsening effusion and associated atelectasis or infiltrate. Electronically Signed   By: Lupita Raider M.D.   On: 04/11/2023 08:51   ECHOCARDIOGRAM COMPLETE  Result Date: 04/10/2023    ECHOCARDIOGRAM REPORT   Patient Name:   TALEEA DINATALE Date of Exam: 04/09/2023 Medical Rec #:  [id number]      Height:       62.0 in Accession #:    [id number]     Weight:       141.5 lb Date of Birth:  [birth year]       BSA:          1.650 m Patient Age:    77 years       BP:           121/76 mmHg Patient Gender: F              HR:           58 bpm. Exam Location:  [facility] Procedure: 2D Echo, Cardiac Doppler and Color Doppler Indications:    DOE R06.0  History:        Patient has prior history of Echocardiogram examinations, most                 recent 05/24/2022. COPD, Arrythmias:Atrial Fibrillation; Risk                 Factors:Hypertension and Diabetes.  Sonographer:    Dondra Prader RVT RCS Referring Phys: [id number] Sunnie Nielsen  Sonographer Comments: Patient unable to tolerate probe pressure on abdomen; no subcostal views. IMPRESSIONS  1. Left ventricular ejection fraction, by estimation, is 55 to 60%. The left ventricle has normal function. The left ventricle has no regional wall motion abnormalities. Left  ventricular diastolic parameters are consistent with Grade I diastolic dysfunction (impaired relaxation).  2. Right ventricular systolic function is normal. The right ventricular size is normal. There is normal pulmonary artery systolic pressure.  3. Left atrial size was mildly dilated.  4. The mitral valve is normal in structure. Mild mitral valve regurgitation. Mild to moderate mitral stenosis. The mean mitral valve gradient is 6.0 mmHg. Severe mitral annular calcification.  5. The aortic valve is normal in structure. Aortic valve regurgitation is trivial. No aortic stenosis is present.  6. The inferior vena cava is normal in size with greater than 50% respiratory variability, suggesting right atrial pressure of 3 mmHg. FINDINGS  Left Ventricle: Left ventricular ejection fraction, by estimation, is 55 to 60%. The left ventricle has normal function. The left ventricle has no regional wall motion abnormalities. The left ventricular internal cavity size was normal in size. There is  no left ventricular hypertrophy. Left ventricular diastolic parameters are consistent with Grade I diastolic dysfunction (impaired relaxation). Right Ventricle: The right ventricular size is normal. No increase in right ventricular wall thickness. Right ventricular systolic function is normal. There is normal pulmonary artery systolic pressure. The tricuspid regurgitant velocity is 2.31 m/s, and  with an assumed right atrial pressure of 3 mmHg, the estimated right ventricular systolic pressure is 24.3 mmHg. Left Atrium: Left atrial size was mildly dilated. Right Atrium: Right atrial size was normal in size. Pericardium: There is no evidence of pericardial effusion. Mitral Valve: The mitral valve is normal in structure. There is moderate thickening  of the mitral valve leaflet(s). There is moderate calcification of the mitral valve leaflet(s). Severe mitral annular calcification. Mild mitral valve regurgitation. Mild  to moderate mitral  valve stenosis. The mean mitral valve gradient is 6.0 mmHg. Tricuspid Valve: The tricuspid valve is normal in structure. Tricuspid valve regurgitation is mild . No evidence of tricuspid stenosis. Aortic Valve: The aortic valve is normal in structure. Aortic valve regurgitation is trivial. Aortic regurgitation PHT measures 600 msec. No aortic stenosis is present. Aortic valve mean gradient measures 7.0 mmHg. Aortic valve peak gradient measures 12.7 mmHg. Aortic valve area, by VTI measures 1.18 cm. Pulmonic Valve: The pulmonic valve was normal in structure. Pulmonic valve regurgitation is not visualized. No evidence of pulmonic stenosis. Aorta: The aortic root is normal in size and structure. Venous: The inferior vena cava is normal in size with greater than 50% respiratory variability, suggesting right atrial pressure of 3 mmHg. IAS/Shunts: No atrial level shunt detected by color flow Doppler.  LEFT VENTRICLE PLAX 2D LVIDd:         4.30 cm   Diastology LVIDs:         3.10 cm   LV e' medial:    4.56 cm/s LV PW:         1.00 cm   LV E/e' medial:  24.6 LV IVS:        0.80 cm   LV e' lateral:   4.87 cm/s LVOT diam:     1.40 cm   LV E/e' lateral: 23.0 LV SV:         43 LV SV Index:   26 LVOT Area:     1.54 cm  RIGHT VENTRICLE RV Basal diam:  2.60 cm RV S prime:     11.30 cm/s TAPSE (M-mode): 1.7 cm LEFT ATRIUM             Index        RIGHT ATRIUM          Index LA diam:        4.40 cm 2.67 cm/m   RA Area:     7.81 cm LA Vol (A2C):   50.9 ml 30.84 ml/m  RA Volume:   13.80 ml 8.36 ml/m LA Vol (A4C):   42.4 ml 25.69 ml/m LA Biplane Vol: 49.3 ml 29.87 ml/m  AORTIC VALVE                     PULMONIC VALVE AV Area (Vmax):    1.37 cm      PV Vmax:       1.06 m/s AV Area (Vmean):   1.26 cm      PV Peak grad:  4.5 mmHg AV Area (VTI):     1.18 cm AV Vmax:           178.00 cm/s AV Vmean:          116.000 cm/s AV VTI:            0.369 m AV Peak Grad:      12.7 mmHg AV Mean Grad:      7.0 mmHg LVOT Vmax:         158.00  cm/s LVOT Vmean:        94.800 cm/s LVOT VTI:          0.282 m LVOT/AV VTI ratio: 0.76 AI PHT:            600 msec  AORTA Ao Root diam: 2.80 cm Ao Asc  diam:  3.60 cm MITRAL VALVE                TRICUSPID VALVE MV Area (PHT): 2.07 cm     TR Peak grad:   21.3 mmHg MV Mean grad:  6.0 mmHg     TR Vmax:        231.00 cm/s MV Decel Time: 366 msec MR Peak grad: 11.8 mmHg     SHUNTS MR Mean grad: 6.0 mmHg      Systemic VTI:  0.28 m MR Vmax:      172.00 cm/s   Systemic Diam: 1.40 cm MR Vmean:     116.0 cm/s MV E velocity: 112.00 cm/s MV A velocity: 167.00 cm/s MV E/A ratio:  0.67 Lorine Bears MD Electronically signed by Lorine Bears MD Signature Date/Time: 04/10/2023/5:17:03 PM    Final    DG Chest Port 1 View  Result Date: 04/10/2023 CLINICAL DATA:  142230 Pleural effusion 142230. EXAM: PORTABLE CHEST 1 VIEW COMPARISON:  Chest radiograph 04/09/2023. FINDINGS: Decreased, trace residual right pleural effusion. Increasing airspace opacity in the medial aspect of the right lung base, suspicious for aspiration or developing infection. No pneumothorax. IMPRESSION: Decreased, trace residual right pleural effusion. Increasing airspace opacity in the medial aspect of the right lung base, suspicious for aspiration or developing infection. Electronically Signed   By: Orvan Falconer M.D.   On: 04/10/2023 12:47   DG Chest Port 1 View  Result Date: 04/09/2023 CLINICAL DATA:  Post thoracentesis EXAM: PORTABLE CHEST 1 VIEW COMPARISON:  CT chest 04/07/2023, chest x-ray 05/22/2022 FINDINGS: Left-sided electronic recording device. Normal cardiac size. Mitral annular calcifications. Left lung grossly clear. Decreased right pleural effusion with small moderate residual pleural effusion. No convincing right pneumothorax. Airspace disease at the right base. Aortic atherosclerosis. Old appearing left-sided rib fractures. Left shoulder replacement IMPRESSION: Decreased right pleural effusion with small to moderate residual pleural  effusion. No convincing pneumothorax. Airspace disease at the right base, atelectasis versus pneumonia. Electronically Signed   By: Jasmine Pang M.D.   On: 04/09/2023 16:27   US Abdomen Limited RUQ (LIVER/GB)  Result Date: 04/09/2023 CLINICAL DATA:  Cirrhosis EXAM: ULTRASOUND ABDOMEN LIMITED RIGHT UPPER QUADRANT COMPARISON:  None Available. FINDINGS: Gallbladder: Prior cholecystectomy. Common bile duct: Diameter: 6 mm. Liver: No focal lesion identified. Nodular liver contour. Normal parenchymal echogenicity. Portal vein is patent on color Doppler imaging with normal direction of blood flow towards the liver. Other: Right pleural effusion. IMPRESSION: 1. Cirrhotic liver morphology. 2. Right pleural effusion. Electronically Signed   By: Allegra Lai M.D.   On: 04/09/2023 12:59   DG Foot Complete Right  Result Date: 04/08/2023 CLINICAL DATA:  Right foot pain EXAM: RIGHT FOOT COMPLETE - 3+ VIEW COMPARISON:  None Available. FINDINGS: No fracture or dislocation is seen. Mild degenerative changes of the 1st MCP joint. Degenerative changes of the dorsal midfoot. Moderate posterior and plantar enthesophytes. IMPRESSION: No fracture or dislocation is seen. Degenerative changes, as above. Electronically Signed   By: Charline Bills M.D.   On: 04/08/2023 01:55   CT CHEST WO CONTRAST  Result Date: 04/07/2023 CLINICAL DATA:  Chest pain, shortness of breath EXAM: CT CHEST WITHOUT CONTRAST TECHNIQUE: Multidetector CT imaging of the chest was performed following the standard protocol without IV contrast. RADIATION DOSE REDUCTION: This exam was performed according to the departmental dose-optimization program which includes automated exposure control, adjustment of the mA and/or kV according to patient size and/or use of iterative reconstruction technique. COMPARISON:  CT 05/23/2022 FINDINGS:  Cardiovascular: Heart size is upper limits of normal. No pericardial effusion. Thoracic aorta is nonaneurysmal. There are  atherosclerotic vascular calcifications of the aorta and coronary arteries. Calcification of the mitral annulus. Central pulmonary vasculature is nondilated. Mediastinum/Nodes: No enlarged mediastinal or axillary lymph nodes. Thyroid gland, trachea, and esophagus demonstrate no significant findings. Small hiatal hernia. Lungs/Pleura: Moderate-large layering right-sided pleural effusion with complete atelectasis of the right lower lobe and near-complete atelectasis of the right middle lobe. Partial compressive atelectasis of the right upper lobe. Mild mosaic attenuation of the aerated portion of the right upper lobe and within the left lung. No left-sided pleural effusion. No pneumothorax. Upper Abdomen: Nodular hepatic surface contour. No acute abnormality. Musculoskeletal: No chest wall mass or suspicious bone lesions identified. Left chest wall loop recorder. IMPRESSION: 1. Moderate-large layering right-sided pleural effusion with complete atelectasis of the right lower lobe and near-complete atelectasis of the right middle lobe. Partial compressive atelectasis of the right upper lobe. 2. Mild mosaic attenuation of the aerated portion of the right upper lobe and within the left lung, which may be related to small airways disease. 3. Nodular hepatic surface contour, suggestive of cirrhosis. 4. Aortic and coronary artery atherosclerosis (ICD10-I70.0). Electronically Signed   By: Duanne Guess D.O.   On: 04/07/2023 15:25   DG Elbow 2 Views Left  Result Date: 03/05/2023 CLINICAL DATA:  Fall, pain EXAM: LEFT ELBOW - 2 VIEW COMPARISON:  None Available. FINDINGS: Intramedullary stem of the left shoulder replacement extends into the distal left humerus. Extensive cement material around the mid humerus. No visible acute fracture, subluxation or dislocation. No joint effusion. IMPRESSION: No acute bony abnormality. Electronically Signed   By: Charlett Nose M.D.   On: 03/05/2023 17:43   DG Foot Complete  Right  Result Date: 03/05/2023 CLINICAL DATA:  Fall, pain EXAM: RIGHT FOOT COMPLETE - 3+ VIEW COMPARISON:  None Available. FINDINGS: No acute bony abnormality. Specifically, no fracture, subluxation, or dislocation. Plantar and posterior calcaneal spurs. Soft tissues are intact. IMPRESSION: No acute bony abnormality. Electronically Signed   By: Charlett Nose M.D.   On: 03/05/2023 17:43   DG Shoulder Left  Result Date: 03/05/2023 CLINICAL DATA:  Fall, pain EXAM: LEFT SHOULDER - 2+ VIEW COMPARISON:  None Available. FINDINGS: Changes of prior left shoulder replacement. Amorphous material in the region of old mid humeral fracture, likely cement. No acute fracture, subluxation or dislocation. Old healed left rib fractures. IMPRESSION: Prior left shoulder replacement and old mid left humeral fracture with extensive amorphous hyperdense material, presumably cement. No visible acute bony abnormality. Electronically Signed   By: Charlett Nose M.D.   On: 03/05/2023 17:42   CT HEAD WO CONTRAST  Result Date: 03/05/2023 CLINICAL DATA:  Provided history: Syncope/presyncope, cerebrovascular cause suspected. Fall. Possible head trauma. Neck tenderness. On Eliquis. EXAM: CT HEAD WITHOUT CONTRAST CT CERVICAL SPINE WITHOUT CONTRAST TECHNIQUE: Multidetector CT imaging of the head and cervical spine was performed following the standard protocol without intravenous contrast. Multiplanar CT image reconstructions of the cervical spine were also generated. RADIATION DOSE REDUCTION: This exam was performed according to the departmental dose-optimization program which includes automated exposure control, adjustment of the mA and/or kV according to patient size and/or use of iterative reconstruction technique. COMPARISON:  None. FINDINGS: CT HEAD FINDINGS Brain: Mild generalized cerebral atrophy. Patchy and ill-defined hypoattenuation within the cerebral white matter, nonspecific but compatible with mild chronic small vessel ischemic  disease. There is no acute intracranial hemorrhage. No demarcated cortical infarct. No extra-axial fluid collection.  No evidence of an intracranial mass. No midline shift. Vascular: No hyperdense vessel.  Atherosclerotic calcifications. Skull: No calvarial fracture or aggressive osseous lesion. Sinuses/Orbits: No mass or acute finding within the imaged orbits. No significant paranasal sinus disease at the imaged levels. CT CERVICAL SPINE FINDINGS Alignment: Levocurvature of the cervical spine. Nonspecific reversal of the expected cervical doses. Slight C2-C3 grade 1 anterolisthesis. Skull base and vertebrae: The basion-dental and atlanto-dental intervals are maintained.No evidence of acute fracture to the cervical spine. Multilevel ventral osteophytes. Soft tissues and spinal canal: No prevertebral fluid or swelling. No visible canal hematoma. Disc levels: Cervical spondylosis with multilevel disc space narrowing, disc bulges/central disc protrusions, posterior disc osteophyte complexes, endplate spurring, uncovertebral hypertrophy and facet arthrosis. Disc space narrowing is greatest at C4-C5, C5-C6 and C6-C7 (moderately advanced at these levels). Multilevel spinal canal stenosis. Most notably at C6-C7, a posterior disc osteophyte complex contributes to canal stenosis which appears at least moderate in severity. Multilevel bony neural foraminal narrowing. Upper chest: Partially imaged right pleural effusion. No visible pneumothorax. IMPRESSION: CT head: 1.  No evidence of an acute intracranial abnormality. 2. Mild cerebral atrophy and cerebral white matter chronic small vessel ischemic disease. CT cervical spine: 1. No evidence of an acute cervical spine fracture. 2. Levocurvature of the cervical spine. 3. Nonspecific reversal of the expected cervical lordosis. 4. Cervical spondylosis as described. At C6-C7, a posterior disc osteophyte complex contributes to spinal canal stenosis which appears at least moderate in  severity. 5. Partially imaged right pleural effusion. Electronically Signed   By: Jackey Loge D.O.   On: 03/05/2023 17:13   CT Cervical Spine Wo Contrast  Result Date: 03/05/2023 CLINICAL DATA:  Provided history: Syncope/presyncope, cerebrovascular cause suspected. Fall. Possible head trauma. Neck tenderness. On Eliquis. EXAM: CT HEAD WITHOUT CONTRAST CT CERVICAL SPINE WITHOUT CONTRAST TECHNIQUE: Multidetector CT imaging of the head and cervical spine was performed following the standard protocol without intravenous contrast. Multiplanar CT image reconstructions of the cervical spine were also generated. RADIATION DOSE REDUCTION: This exam was performed according to the departmental dose-optimization program which includes automated exposure control, adjustment of the mA and/or kV according to patient size and/or use of iterative reconstruction technique. COMPARISON:  None. FINDINGS: CT HEAD FINDINGS Brain: Mild generalized cerebral atrophy. Patchy and ill-defined hypoattenuation within the cerebral white matter, nonspecific but compatible with mild chronic small vessel ischemic disease. There is no acute intracranial hemorrhage. No demarcated cortical infarct. No extra-axial fluid collection. No evidence of an intracranial mass. No midline shift. Vascular: No hyperdense vessel.  Atherosclerotic calcifications. Skull: No calvarial fracture or aggressive osseous lesion. Sinuses/Orbits: No mass or acute finding within the imaged orbits. No significant paranasal sinus disease at the imaged levels. CT CERVICAL SPINE FINDINGS Alignment: Levocurvature of the cervical spine. Nonspecific reversal of the expected cervical doses. Slight C2-C3 grade 1 anterolisthesis. Skull base and vertebrae: The basion-dental and atlanto-dental intervals are maintained.No evidence of acute fracture to the cervical spine. Multilevel ventral osteophytes. Soft tissues and spinal canal: No prevertebral fluid or swelling. No visible canal  hematoma. Disc levels: Cervical spondylosis with multilevel disc space narrowing, disc bulges/central disc protrusions, posterior disc osteophyte complexes, endplate spurring, uncovertebral hypertrophy and facet arthrosis. Disc space narrowing is greatest at C4-C5, C5-C6 and C6-C7 (moderately advanced at these levels). Multilevel spinal canal stenosis. Most notably at C6-C7, a posterior disc osteophyte complex contributes to canal stenosis which appears at least moderate in severity. Multilevel bony neural foraminal narrowing. Upper chest: Partially imaged right pleural effusion. No  visible pneumothorax. IMPRESSION: CT head: 1.  No evidence of an acute intracranial abnormality. 2. Mild cerebral atrophy and cerebral white matter chronic small vessel ischemic disease. CT cervical spine: 1. No evidence of an acute cervical spine fracture. 2. Levocurvature of the cervical spine. 3. Nonspecific reversal of the expected cervical lordosis. 4. Cervical spondylosis as described. At C6-C7, a posterior disc osteophyte complex contributes to spinal canal stenosis which appears at least moderate in severity. 5. Partially imaged right pleural effusion. Electronically Signed   By: Jackey Loge D.O.   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 However PET scan did not show any lymphadenopathy or soft tissue mass. Peripheral blood flow cytometry is negative. Recommend a bone marrow biopsy inpatient.  Discussed with IR.  She will either get procedure done today or tomorrow.  Clinically it does not appear that she has disseminated lymphoma disease. Recommend thoracic surgery evaluation for VATS and pleural biopsy to help with cephalization of lymphoma. Discussed the plan with hospitalist.  Initiated discussion with [facility] thoracic surgery  team.  # Pneumonia, continue antibiotics. # History of cirrhosis, continue diuretics.  She will need GI evaluation outpatient. # Unintentional weight loss, recommend nutrition supplementation.  Recommend dietitian evaluation.  I called patient's son and updated above. Thank you for allowing me to participate in the care of this patient.   Rickard Patience, MD, PhD Hematology Oncology 04/24/2023

## 2023-04-24 NOTE — Progress Notes (Signed)
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 Pt has yet to receive treatment and still in staging process as per pt's family at bedside. S/p thoracentesis. Pleural fluid cx NGTD.   Pneumonia: continue on IV rocephin, azithromycin & encourage incentive spirometry. Increased steroid frequency & increased bronchodilators to q4hr. Tussionex prn for cough. 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  Hx of cirrhosis: as per pt's family. Continue on lasix   Thrombocytopenia: likely secondary to cirrhosis. WNL today   CKDIIIa: Cr is better than baseline    Hypothyroidism: continue on home dose of levothyroxine    PAF: continue on amio, eliquis    GERD: continue on PPI   Acute on chronic hypoxic respiratory failure: continue on supplemental oxygen and wean back to baseline as tolerated.       DVT prophylaxis: eliquis  Code Status: full  Family Communication: discussed pt's care w/ pt's son and answered his questions  Disposition Plan: likely d/c back home w/ HH   Level of care: Telemetry Medical  Status is: Inpatient Remains inpatient appropriate because: severity of illness      Consultants:  Onco   Procedures:   Antimicrobials: azithromycin, rocephin    Subjective: Pt still c/o shortness of breath & cough   Objective: Vitals:    04/23/23 1629 04/23/23 1959 04/24/23 0208 04/24/23 0833  BP: (!) 124/56 129/62 135/62 (!) 133/91  Pulse: 78 76 68 73  Resp: 18 18 18 16   Temp: 98.2 F (36.8 C) 98.2 F (36.8 C) 98.3 F (36.8 C) 97.6 F (36.4 C)  TempSrc: Oral   Oral  SpO2: 94% 95% 95% 95%  Weight:      Height:        Intake/Output Summary (Last 24 hours) at 04/24/2023 0845 Last data filed at 04/24/2023 0500 Gross per 24 hour  Intake 1548.76 ml  Output --  Net 1548.76 ml   Filed Weights   04/20/23 1949  Weight: 61.2 kg    Examination:  General exam: Appears anxious  Respiratory system: diminished breath sounds b/l. Scattered wheezes b/l  Cardiovascular system: S1/S2+. No rubs or clicks Gastrointestinal system: abd is soft, NT, ND & hypoactive bowel sounds  Central nervous system: alert & oriented. Moves all extremities Psychiatry: judgement and insight appears at baseline. Anxious mood and affect     Data Reviewed: I have personally reviewed following labs and imaging studies  CBC: Recent Labs  Lab 04/20/23 2002 04/21/23 0529 04/22/23 0347 04/23/23 0520 04/24/23 0434  WBC 10.2 10.1 6.1 5.7 7.5  NEUTROABS 7.2  --   --   --   --   HGB 12.4 11.0* 10.3* 10.6* 10.2*  HCT 38.5 33.3* 31.2* 31.8* 31.1*  MCV 93.7 92.2 92.0 91.6 92.8  PLT 169 147* 142* 134* 163   Basic Metabolic Panel: Recent Labs  Lab 04/20/23 2002 04/21/23 6440 04/22/23 0347 04/23/23 0520 04/24/23 0434  NA 132* 134* 135 136 135  K 4.2 4.3 4.1 3.8 4.1  CL 96* 98 99 98 96*  CO2 27 26 26 27 30   GLUCOSE 121* 128* 96 119* 200*  BUN 19 16 13 11 17   CREATININE 0.90 0.72 0.66 0.61 0.79  CALCIUM 8.3* 8.2* 8.1* 8.2* 8.4*   GFR: Estimated Creatinine Clearance: 49.9 mL/min (by C-G formula based on SCr of 0.79 mg/dL). Liver Function Tests: Recent Labs  Lab 04/20/23 2002  AST 20  ALT 12  ALKPHOS 42  BILITOT 0.5  PROT 7.1  ALBUMIN 3.4*   No results for input(s): "LIPASE", "AMYLASE" in the last 168 hours. No results for  input(s): "AMMONIA" in the last 168 hours. Coagulation Profile: No results for input(s): "INR", "PROTIME" in the last 168 hours. Cardiac Enzymes: No results for input(s): "CKTOTAL", "CKMB", "CKMBINDEX", "TROPONINI" in the last 168 hours. BNP (last 3 results) No results for input(s): "PROBNP" in the last 8760 hours. HbA1C: No results for input(s): "HGBA1C" in the last 72 hours. CBG: Recent Labs  Lab 04/19/23 1040  GLUCAP 119*   Lipid Profile: No results for input(s): "CHOL", "HDL", "LDLCALC", "TRIG", "CHOLHDL", "LDLDIRECT" in the last 72 hours. Thyroid Function Tests: No results for input(s): "TSH", "T4TOTAL", "FREET4", "T3FREE", "THYROIDAB" in the last 72 hours. Anemia Panel: No results for input(s): "VITAMINB12", "FOLATE", "FERRITIN", "TIBC", "IRON", "RETICCTPCT" in the last 72 hours. Sepsis Labs: Recent Labs  Lab 04/21/23 0152  LATICACIDVEN 1.3    Recent Results (from the past 240 hour(s))  Culture, blood (routine x 2)     Status: None (Preliminary result)   Collection Time: 04/21/23  1:47 AM   Specimen: BLOOD  Result Value Ref Range Status   Specimen Description BLOOD BLOOD RIGHT FOREARM  Final   Special Requests   Final    BOTTLES DRAWN AEROBIC AND ANAEROBIC Blood Culture adequate volume   Culture   Final    NO GROWTH 3 DAYS Performed at [facility], [address] [place], [place] [address]    Report Status PENDING  Incomplete  Culture, blood (routine x 2)     Status: None (Preliminary result)   Collection Time: 04/21/23  1:51 AM   Specimen: BLOOD  Result Value Ref Range Status   Specimen Description BLOOD WEAKLY REACTIVE  Final   Special Requests   Final    BOTTLES DRAWN AEROBIC AND ANAEROBIC Blood Culture adequate volume   Culture   Final    NO GROWTH 3 DAYS Performed at [facility], [address], [place], [place] [address]    Report Status PENDING  Incomplete  Body fluid culture w Gram Stain     Status: None (Preliminary result)    Collection Time: 04/21/23  3:56 PM   Specimen: PATH Cytology Pleural fluid  Result Value Ref Range Status   Specimen Description   Final    PLEURAL Performed at [facility], [address] [place], [place] [address]    Special Requests   Final    NONE Performed at [facility], [address], [place], [place] [address]    Gram Stain   Final    WBC PRESENT, PREDOMINANTLY MONONUCLEAR NO ORGANISMS SEEN    Culture   Final    NO GROWTH 2 DAYS Performed at [facility], [address] [place], [place] [address]    Report Status PENDING  Incomplete         Radiology Studies: DG Chest [facility] 1 [address]  Result Date: 04/22/2023 CLINICAL DATA:  Shortness of breath EXAM: PORTABLE CHEST 1 VIEW COMPARISON:  04/21/2023 FINDINGS: Large right pleural effusion with right lower lobe atelectasis or infiltrate, similar to prior study. No confluent opacity on the left. Heart and mediastinal contours within normal limits. Aortic atherosclerosis. No acute bony abnormality. IMPRESSION: Large right pleural effusion with right lower lobe atelectasis or infiltrate, unchanged. Electronically Signed   By: Charlett Nose M.D.   On: 04/22/2023 19:19        Scheduled Meds:  ALPRAZolam  0.25 mg Oral BID   amiodarone  100 mg Oral Daily   apixaban  2.5 mg Oral BID   ascorbic acid  1,500 mg Oral Daily   feeding supplement  237 mL Oral BID BM   fentaNYL  1 patch Transdermal Q72H   ferrous sulfate  325 mg Oral Daily   furosemide  40 mg Intravenous Daily   levothyroxine  125 mcg Oral QAC breakfast   magnesium oxide  200 mg Oral Daily   methylPREDNISolone (SOLU-MEDROL) injection  40 mg Intravenous Q12H   omega-3 acid ethyl esters  1 capsule Oral Daily   pantoprazole  40 mg Oral Daily   polyvinyl alcohol  2 drop Both Eyes Daily   Continuous Infusions:  azithromycin Stopped (04/24/23 0154)   cefTRIAXone (ROCEPHIN)  IV Stopped (04/23/23 2307)     LOS: 3 days       Charise Killian, MD [facility] Pager [phone]  If 7PM-7AM, please contact night-coverage [email or web address] 04/24/2023, 8:45 AM

## 2023-04-24 NOTE — Progress Notes (Cosign Needed)
Physical Therapy Treatment Patient Details Name: [name] MRN: [id number] DOB: [birth year] Today's Date: 04/24/2023   History of Present Illness Patient is a 77 year old female with recurrent right pleural effusion. 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    PT Comments  Pt ready for session.  She is able to get to EOB with CGA x 1.  Upon sitting, she needs to void and stands with min a x 1 and is inc of urine all the way to the bathroom on the floor.  She spends time on the commode to void but is unable to have a BM.  While on commode, she does participate in bathing as she is able but does need significant help for set up and care.  She needs full assist to don back brace, then is able to stand and walk x 2 laps on unit with RW and min a x 1 for occasional balance and assist to manage O2 tank and cords.  She opts to return to bed after session and needs min a to return to supine for comfort.  Discussed at length with pt and son in regards to discharge plan.  She is aware she is struggling more and has 3 admissions within a short period of time.  She is a PACE participant and lives alone but does have some intermittent family support but it is becoming challenging for them to provide the level assist/care she needs a this time.  She continues to need min a x 1 for mobility for safety, max a to don back brace and assist to manage O2 cording.  She did not use O2 at baseline.  Pt and family is agreeable to rehab recommendation to allow her to increase strength and safety for a safer transition home.     If plan is discharge home, recommend the following: A little help with walking and/or transfers;A little help with bathing/dressing/bathroom;Assist for  transportation;Help with stairs or ramp for entrance;Assistance with cooking/housework   Can travel by private vehicle        Equipment Recommendations  None recommended by PT    Recommendations for Other Services       Precautions / Restrictions Precautions Precautions: Fall Precaution Comments: history of lumbar fracture and multi level stenosis. TLSO Restrictions Weight Bearing Restrictions: No     Mobility  Bed Mobility Overal bed mobility: Needs Assistance Bed Mobility: Supine to Sit, Sit to Supine     Supine to sit: Contact guard Sit to supine: Min assist   General bed mobility comments: assistance for LE support. TLSO donned prior to standing with improvement in pain reported Patient Response: Cooperative  Transfers Overall transfer level: Needs assistance Equipment used: Rolling walker (2 wheels) Transfers: Sit to/from Stand Sit to Stand: Contact guard assist                Ambulation/Gait Ambulation/Gait assistance: Contact guard assist, Min assist Gait Distance (Feet): 350 Feet Assistive device: Rolling walker (2 wheels) Gait Pattern/deviations: Step-through pattern, Decreased stride length Gait velocity: decreased     General Gait Details: x 2 laps today on 2 lpm with sats remaining in high 90's today.  no dizziness noted or dyspnea   Stairs             Wheelchair  Mobility     Tilt Bed Tilt Bed Patient Response: Cooperative  Modified Rankin (Stroke Patients Only)       Balance Overall balance assessment: Needs assistance Sitting-balance support: Feet supported, No upper extremity supported Sitting balance-Leahy Scale: Good     Standing balance support: Bilateral upper extremity supported Standing balance-Leahy Scale: Fair                              Cognition Arousal: Alert Behavior During Therapy: WFL for tasks assessed/performed Overall Cognitive Status: Within Functional Limits for tasks assessed                                           Exercises Other Exercises Other Exercises: bathroom to void and for bathing    General Comments        Pertinent Vitals/Pain Pain Assessment Pain Assessment: Faces Faces Pain Scale: Hurts even more Pain Location: back and tailbone with bed mobility Pain Descriptors / Indicators: Discomfort, Grimacing Pain Intervention(s): Limited activity within patient's tolerance, Monitored during session, Repositioned    Home Living                          Prior Function            PT Goals (current goals can now be found in the care plan section) Progress towards PT goals: Progressing toward goals    Frequency    Min 1X/week      PT Plan      Co-evaluation              AM-PAC PT "6 Clicks" Mobility   Outcome Measure  Help needed turning from your back to your side while in a flat bed without using bedrails?: A Little Help needed moving from lying on your back to sitting on the side of a flat bed without using bedrails?: A Little Help needed moving to and from a bed to a chair (including a wheelchair)?: A Little Help needed standing up from a chair using your arms (e.g., wheelchair or bedside chair)?: A Little Help needed to walk in hospital room?: A Little Help needed climbing 3-5 steps with a railing? : A Lot 6 Click Score: 17    End of Session Equipment Utilized During Treatment: Gait belt;Back brace Activity Tolerance: Patient limited by fatigue;Patient tolerated treatment well Patient left: in bed;with call bell/phone within reach;with bed alarm set;with family/visitor present Nurse Communication: Mobility status PT Visit Diagnosis: Muscle weakness (generalized) (M62.81);Unsteadiness on feet (R26.81)     Time: 6301-6010 PT Time Calculation (min) (ACUTE ONLY): 36 min  Charges:    $Gait Training: 8-22 mins $Therapeutic Activity: 8-22 mins PT General Charges $$ ACUTE PT VISIT: 1 Visit                    Danielle Dess, PTA 04/24/23, 12:16 PM

## 2023-04-24 NOTE — Consult Note (Signed)
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 Team is requesting a bone marrow biopsy for further evaluation of lymphoma and staging purposes  Referring Physician(s): Dr. Cathie Hoops  Supervising Physician: Malachy Moan  Patient Status: [facility] - In-pt  History of Present Illness: [name] is a 77 y.o. female  inpatient. History of hypothyroidism, HTN, GERD, cirrhosis, DVT, DM, COPD ( on 3L of 02). 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 Patient presented to the ED at [facility] on 11.15.24 with [facility]. Found to have a pleural effusion and PNA. IR performed a right sided thoracentesis on 11.15.24 with cytology resulting in lymphoma. Team is requesting a bone marrow biopsy for further evaluation of lymphoma and staging.   All labs are within acceptable parameters. Patient is on eliquis. Last dose given on 11.18.24 @ 10:11 Allergies include cephalosporin, codeine, contrast media and oxycodone.   Patient alert and laying in bed, tearful. Endorses back pain. Left arm pain and SHOB. Denies any fevers, headache, chest pain, cough, abdominal pain, nausea, vomiting or bleeding.   Return precautions and treatment recommendations and follow-up discussed with the patient  who is agreeable with the plan.      Past Medical History:  Diagnosis Date   Anemia    Asthma    COPD (chronic obstructive pulmonary disease) (HCC)    Diabetes mellitus without complication (HCC)    type 2   DVT (deep venous thrombosis) (HCC) 1980   GERD (gastroesophageal reflux disease)    History of hiatal hernia    Hypertension    Hypothyroidism     Past Surgical History:  Procedure Laterality Date   CATARACT EXTRACTION Bilateral    CHOLECYSTECTOMY     COLONOSCOPY     ESOPHAGOGASTRODUODENOSCOPY (EGD) WITH PROPOFOL N/A 01/17/2022   Procedure: ESOPHAGOGASTRODUODENOSCOPY (EGD) WITH PROPOFOL;  Surgeon: Jaynie Collins, DO;  Location: [facility] ENDOSCOPY;  Service:  Gastroenterology;  Laterality: N/A;   HUMERUS SURGERY Left    fracture (7 surgeries total)   MENISCUS REPAIR Right 2019   THYROIDECTOMY     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; reconstruction from hip bone   TONSILECTOMY, ADENOIDECTOMY, BILATERAL MYRINGOTOMY AND TUBES     TOTAL KNEE ARTHROPLASTY Right 03/14/2022   Procedure: TOTAL KNEE ARTHROPLASTY;  Surgeon: Reinaldo Berber, MD;  Location: [facility] ORS;  Service: Orthopedics;  Laterality: Right;   TOTAL SHOULDER REPLACEMENT Left    TUBAL LIGATION     WRIST SURGERY Left    multiple fractures from falls (14 surgeries from wrist to shoulder)    Allergies: Cephalosporins, Codeine, Contrast media [iodinated contrast media], and Oxycodone  Medications: Prior to Admission medications   Medication Sig Start Date End Date Taking? Authorizing Provider  ADVAIR HFA 115-21 MCG/ACT inhaler Inhale 2 puffs into the lungs 2 (two) times daily. 04/07/23  Yes [provider]  amiodarone (PACERONE) 100 MG tablet Take 1 tablet (100 mg total) by mouth daily. 04/12/23  Yes Wieting, Richard, MD  apixaban (ELIQUIS) 2.5 MG TABS tablet Take 1 tablet (2.5 mg total) by mouth 2 (two) times daily. 04/17/23 06/16/23 Yes Leeroy Bock, MD  enalapril (VASOTEC) 20 MG tablet Take 20 mg by mouth daily.   Yes [provider]  esomeprazole (NEXIUM) 40 MG capsule Take 40 mg by mouth 2 (two) times daily. 05/06/22  Yes [provider]  furosemide (LASIX) 20 MG tablet Take 20 mg by mouth daily.   Yes [provider]  ipratropium-albuterol (DUONEB) 0.5-2.5 (3) MG/3ML SOLN Take 3 mLs by  nebulization every 4 (four) hours as needed. 02/14/23  Yes [provider]  levothyroxine (SYNTHROID) 125 MCG tablet Take 125 mcg by mouth daily before breakfast.   Yes [provider]  metoprolol tartrate (LOPRESSOR) 25 MG tablet Take 12.5 mg by mouth 2 (two) times daily.   Yes [provider]  ondansetron (ZOFRAN) 4 MG tablet Take 4 mg by  mouth every 8 (eight) hours as needed for nausea or vomiting.   Yes [provider]  potassium chloride (KLOR-CON) 10 MEQ tablet Take 10 mEq by mouth 2 (two) times daily.   Yes [provider]  rOPINIRole (REQUIP) 0.25 MG tablet Take 0.25 mg by mouth at bedtime. 03/30/23  Yes [provider]  traMADol (ULTRAM) 50 MG tablet Take 50 mg by mouth every 6 (six) hours as needed for moderate pain (pain score 4-6) or severe pain (pain score 7-10).   Yes [provider]  traZODone (DESYREL) 50 MG tablet Take 50 mg by mouth at bedtime as needed for sleep. 03/14/23  Yes [provider]  acetaminophen (TYLENOL) 325 MG tablet Take 1,300 mg by mouth every 6 (six) hours as needed for moderate pain.    [provider]  albuterol (PROVENTIL) (2.5 MG/3ML) 0.083% nebulizer solution Take 2.5 mg by nebulization every 4 (four) hours as needed for wheezing or shortness of breath. Patient not taking: Reported on 04/21/2023    [provider]  albuterol (VENTOLIN HFA) 108 (90 Base) MCG/ACT inhaler Inhale 2-4 puffs by mouth every 4 hours as needed for wheezing, cough, and/or shortness of breath 11/28/20   Loleta Rose, MD  ALPRAZolam Prudy Feeler) 0.25 MG tablet Take 1 tablet (0.25 mg total) by mouth 2 (two) times daily. Patient not taking: Reported on 04/21/2023 04/17/23 05/17/23  Leeroy Bock, MD  Ascorbic Acid (VITAMIN C CR) 1500 MG TBCR Take 1 tablet by mouth daily.    [provider]  bismuth subsalicylate (PEPTO BISMOL) 262 MG/15ML suspension Take 30 mLs by mouth every 6 (six) hours as needed for indigestion or diarrhea or loose stools.    [provider]  Carboxymeth-Glycerin-Polysorb (REFRESH OPTIVE ADVANCED) 0.5-1-0.5 % SOLN Apply 2 drops to eye daily.    [provider]  diclofenac Sodium (VOLTAREN) 1 % GEL Apply 2 g topically 4 (four) times daily as needed.    [provider]  Emollient (EUCERIN) lotion Apply 1  Application topically as needed for dry skin.    [provider]  feeding supplement (ENSURE ENLIVE / ENSURE PLUS) LIQD Take 237 mLs by mouth 2 (two) times daily between meals. 04/17/23   Leeroy Bock, MD  ferrous sulfate 325 (65 FE) MG tablet Take 325 mg by mouth daily.    [provider]  fluticasone (FLONASE) 50 MCG/ACT nasal spray Place 2 sprays into both nostrils daily as needed for allergies or rhinitis.    [provider]  guaiFENesin (MUCINEX) 600 MG 12 hr tablet Take 600 mg by mouth 2 (two) times daily as needed for to loosen phlegm or cough.    [provider]  ketoconazole (NIZORAL) 2 % shampoo Apply 1 Application topically 2 (two) times a week.    [provider]  ketotifen (ZADITOR) 0.025 % ophthalmic solution Place 1 drop into both eyes daily as needed.    [provider]  loratadine (CLARITIN) 10 MG tablet Take 1 tablet (10 mg total) by mouth daily. Patient taking differently: Take 10 mg by mouth daily as needed for allergies, rhinitis  or itching. 05/25/22   Pennie Banter, DO  Magnesium 250 MG TABS Take 1 tablet by mouth daily.    [provider]  nitroGLYCERIN (NITROSTAT) 0.4 MG SL tablet Place 0.4 mg under the tongue every 5 (five) minutes as needed for chest pain.    [provider]  olopatadine (PATANOL) 0.1 % ophthalmic solution Place 1 drop into both eyes daily as needed for allergies.    [provider]  Omega-3 1000 MG CAPS Take 1 capsule by mouth daily.    [provider]  OXYGEN Inhale 2 L into the lungs at bedtime.    [provider]  sodium chloride (OCEAN) 0.65 % SOLN nasal spray Place 1 spray into both nostrils as needed for congestion. 05/24/22   Pennie Banter, DO  SYSTANE ULTRA 0.4-0.3 % SOLN Apply 2 drops to eye QID. 04/07/23   [provider]  TURMERIC PO Take 500 mg by mouth daily.    [provider]  VITAMIN D-VITAMIN K PO Take 45 mcg  by mouth daily.    [provider]  Zinc Oxide 10 % OINT Apply 1 application  topically 3 (three) times daily as needed.    [provider]     Family History  Problem Relation Age of Onset   Kidney disease Mother    Tuberculosis Father     Social History   Socioeconomic History   Marital status: Widowed    Spouse name: Not on file   Number of children: 6   Years of education: Not on file   Highest education level: Not on file  Occupational History   Not on file  Tobacco Use   Smoking status: Never   Smokeless tobacco: Never  Vaping Use   Vaping status: Never Used  Substance and Sexual Activity   Alcohol use: Never   Drug use: Never   Sexual activity: Not Currently  Other Topics Concern   Not on file  Social History Narrative   Lives in senior housing in [place] (apt[place]   Social Determinants of Health   Financial Resource Strain: Not on file  Food Insecurity: No Food Insecurity (04/21/2023)   Hunger Vital Sign    Worried About Running Out of Food in the Last Year: Never true    Ran Out of Food in the Last Year: Never true  Transportation Needs: No Transportation Needs (04/21/2023)   PRAPARE - Administrator, Civil Service (Medical): No    Lack of Transportation (Non-Medical): No  Physical Activity: Not on file  Stress: Not on file  Social Connections: Not on file     Review of Systems: A 12 point ROS discussed and pertinent positives are indicated in the HPI above.  All other systems are negative.  Review of Systems  Constitutional:  Negative for fatigue and fever.  HENT:  Negative for congestion.   Respiratory:  Positive for shortness of breath. Negative for cough.   Gastrointestinal:  Negative for abdominal pain, diarrhea, nausea and vomiting.  Musculoskeletal:  Positive for arthralgias (left arm) and back pain.    Vital Signs: BP (!) 133/91 (BP Location: Left Arm)   Pulse 73   Temp 97.6 F (36.4 C) (Oral)   Resp 16    Ht 5\' 2"  (1.575 m)   Wt 135 lb (61.2 kg)   SpO2 96%   BMI 24.69 kg/m   Advance Care Plan: The advanced care plan/surrogate decision maker was discussed at the time of visit  and documented in the medical record.  S on.   Physical Exam Vitals and nursing note reviewed.  Constitutional:      Appearance: She is well-developed.     Comments: Tearful. anxious  HENT:     Head: Normocephalic and atraumatic.  Eyes:     Conjunctiva/sclera: Conjunctivae normal.  Cardiovascular:     Rate and Rhythm: Normal rate and regular rhythm.  Pulmonary:     Effort: Pulmonary effort is normal.     Breath sounds: Examination of the right-upper field reveals decreased breath sounds. Examination of the right-middle field reveals decreased breath sounds. Examination of the right-lower field reveals decreased breath sounds. Decreased breath sounds present.     Comments: On o2 via [place] Musculoskeletal:        General: Normal range of motion.     Cervical back: Normal range of motion.  Skin:    General: Skin is warm.  Neurological:     General: No focal deficit present.     Mental Status: She is alert and oriented to person, place, and time.     Imaging: NM PET Image Initial (PI) Skull Base To Thigh  Result Date: 04/24/2023 CLINICAL DATA:  Initial treatment strategy for weight loss, monoclonal B-cell lymphocytosis, and right pleural effusion. EXAM: NUCLEAR MEDICINE PET SKULL BASE TO THIGH TECHNIQUE: 7.0 mCi F-18 FDG was injected intravenously. Full-ring PET imaging was performed from the skull base to thigh after the radiotracer. CT data was obtained and used for attenuation correction and anatomic localization. Fasting blood glucose: 119 mg/dl COMPARISON:  CT chest abdomen pelvis dated 04/14/2023 FINDINGS: Mediastinal blood pool activity: SUV max 2.6 Liver activity: SUV max NA NECK: No hypermetabolic lymph nodes in the neck. Incidental CT findings: None. CHEST: No hypermetabolic mediastinal or hilar nodes. No  suspicious pulmonary nodules on the CT scan. Moderate right pleural effusion without convincing nodularity or hypermetabolism. Right middle and right lower lobe compressive atelectasis. Incidental CT findings: Atherosclerotic calcifications of the aortic arch. Mitral valve annular calcifications. Mild coronary atherosclerosis of the LAD. ABDOMEN/PELVIS: No abnormal hypermetabolic activity within the liver, pancreas, adrenal glands, or spleen. No hypermetabolic lymph nodes in the abdomen or pelvis. Nodular hepatic contour, suggesting cirrhosis. Mild diffuse hypermetabolism in the gastric antrum, max SUV 5.6, favored to be reactive or less likely reflecting gastritis. However, there is mild polypoid soft tissue prominence at the pylorus on CT (series 6/image 88), equivocal. Consider direct visualization as clinically warranted. Incidental CT findings: Status post cholecystectomy. Atherosclerotic calcifications of the abdominal aorta and branch vessels. Left extrarenal pelvis. Sigmoid diverticulosis, without evidence of diverticulitis. SKELETON: No focal hypermetabolic activity to suggest skeletal metastasis. Incidental CT findings: Status post left shoulder arthroplasty with posttraumatic deformity involving the distal humerus. Degenerative changes of the visualized thoracolumbar spine. IMPRESSION: No findings specific for malignancy. No suspicious lymphadenopathy in this patient with B-cell lymphocytosis. 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 Possible distal gastritis. Equivocal polypoid soft tissue thickening at the pylorus, consider upper endoscopy for direct visualization as clinically warranted. Electronically Signed   By: Charline Bills M.D.   On: 04/24/2023 10:06   DG Chest Port 1 View  Result Date: 04/22/2023 CLINICAL DATA:  Shortness of breath EXAM: PORTABLE CHEST 1 VIEW COMPARISON:  04/21/2023 FINDINGS: Large right pleural effusion with right lower lobe atelectasis or infiltrate,  similar to prior study. No confluent opacity on the left. Heart and mediastinal contours within normal limits. Aortic atherosclerosis. No acute bony abnormality. IMPRESSION: Large right pleural effusion with right lower  lobe atelectasis or infiltrate, unchanged. Electronically Signed   By: Charlett Nose M.D.   On: 04/22/2023 19:19   DG Chest Port 1 View  Result Date: 04/21/2023 CLINICAL DATA:  Pleural effusion status post thoracentesis. EXAM: PORTABLE CHEST 1 VIEW COMPARISON:  04/20/2023. FINDINGS: Cardiac silhouette is obscured. Large right-sided pleural effusion. Left lung clear. Old healed adjacent left-sided posterior fifth and sixth rib fractures. Calcified aorta. Thoracic degenerative changes. No pneumothorax. IMPRESSION: Large right-sided pleural effusion. Electronically Signed   By: Layla Maw M.D.   On: 04/21/2023 18:16   US THORACENTESIS ASP PLEURAL SPACE W/IMG GUIDE  Result Date: 04/21/2023 INDICATION: Patient with history of COPD, lymphoma and recurrent right pleural effusion. Request for diagnostic and therapeutic right thoracentesis EXAM: ULTRASOUND GUIDED RIGHT THORACENTESIS MEDICATIONS: 8 mL 1% lidocaine COMPLICATIONS: None immediate. PROCEDURE: An ultrasound guided thoracentesis was thoroughly discussed with the patient and questions answered. The benefits, risks, alternatives and complications were also discussed. The patient understands and wishes to proceed with the procedure. Written consent was obtained. Ultrasound was performed to localize and mark an adequate pocket of fluid in the right chest. Pleural effusion noted to be mildly loculated and the largest pocket was selected for drainage. The area was then prepped and draped in the normal sterile fashion. 1% Lidocaine was used for local anesthesia. Under ultrasound guidance a 6 Fr Safe-T-Centesis catheter was introduced. Thoracentesis was performed. The catheter was removed and a dressing applied. FINDINGS: A total of  approximately 400 mL of serosanguineous fluid was removed. Samples were sent to the laboratory as requested by the clinical team. IMPRESSION: Successful ultrasound guided right thoracentesis yielding 400 mL of pleural fluid. Performed by Lynnette Caffey, PA-C Electronically Signed   By: Irish Lack M.D.   On: 04/21/2023 15:52   DG Chest Port 1 View  Result Date: 04/20/2023 CLINICAL DATA:  Shortness of breath EXAM: PORTABLE CHEST 1 VIEW COMPARISON:  Radiographs 04/12/2023 and PET/CT 04/19/2023 FINDINGS: Moderate-large right pleural effusion and associated airspace opacities similar to PET/CT 04/19/2023 given differences in technique. The left lung is clear. Stable cardiomediastinal silhouette. Aortic atherosclerotic calcification. Loop recorder. Left reverse TSA. IMPRESSION: Moderate-large right pleural effusion and associated airspace opacities. Electronically Signed   By: Minerva Fester M.D.   On: 04/20/2023 22:38   CT L-SPINE NO CHARGE  Result Date: 04/14/2023 CLINICAL DATA:  77 year old female fell, found down. On blood thinners. Head hematoma. EXAM: CT LUMBAR SPINE WITHOUT CONTRAST TECHNIQUE: Technique: Multiplanar CT images of the lumbar spine were reconstructed from contemporary CT of the Abdomen and Pelvis. RADIATION DOSE REDUCTION: This exam was performed according to the departmental dose-optimization program which includes automated exposure control, adjustment of the mA and/or kV according to patient size and/or use of iterative reconstruction technique. CONTRAST:  None COMPARISON:  CT thoracic, CT Chest, Abdomen, and Pelvis today reported separately. FINDINGS: Segmentation: Normal. Alignment: Mild dextroconvex upper and levoconvex lower lumbar scoliosis. Straightening of lordosis. No significant spondylolisthesis. Vertebrae: Diffuse osteopenia. L1, L2, and L3 vertebrae appear intact. Mild L4 superior endplate compression (sagittal image 64, it is eccentric to the right (coronal image 48).  L4 vertebra otherwise intact. L5 vertebra appears intact. Superior sacrum and SI joints appear intact. But there is a subtle, minimally displaced fracture of the lower sacrum in the midline S5 (sagittal image 62). Paraspinal and other soft tissues: Abdomen and pelvis are detailed separately. Lumbar paraspinal soft tissues are within normal limits. Disc levels: Lumbar spine degeneration with multilevel vacuum disc. Moderate to severe disc space loss with  disc osteophyte disease L2-L3 through L3-L4. Estimated lumbar spinal stenosis by CT as follows L2-L3: Mild to moderate, and with lateral recess stenosis greater on the left. L3-L4:  Mild-to-moderate. L4-L5:  Moderate to severe (series 2, image 82). IMPRESSION: 1. Osteopenia. Positive for a minimally displaced acute fracture of the lower sacrum at S5. And a mild L4 superior endplate compression fracture is age indeterminate. 2. Advanced lumbar spine disc and endplate degeneration contributing to multilevel spinal stenosis which is moderate to severe at L4-L5 and mild to moderate at L2-L3 and L3-L4. 3. CT Abdomen and Pelvis today reported separately. Electronically Signed   By: Odessa Fleming M.D.   On: 04/14/2023 07:19   CT T-SPINE NO CHARGE  Result Date: 04/14/2023 CLINICAL DATA:  77 year old female fell, found down. On blood thinners. Head hematoma. EXAM: CT THORACIC SPINE WITHOUT CONTRAST TECHNIQUE: Multiplanar CT images of the thoracic spine were reconstructed from contemporary CT of the Chest. RADIATION DOSE REDUCTION: This exam was performed according to the departmental dose-optimization program which includes automated exposure control, adjustment of the mA and/or kV according to patient size and/or use of iterative reconstruction technique. CONTRAST:  None COMPARISON:  CT cervical spine, CT Chest, Abdomen, and Pelvis today reported separately. Recent chest CT 04/07/2023. FINDINGS: Limited cervical spine imaging:  Reported separately. Thoracic spine segmentation:  Appears normal; hypoplastic left 12th rib. Alignment: Stable thoracic kyphosis from earlier this month. No significant scoliosis or spondylolisthesis. Vertebrae: Chronic osteopenia. Subtle loss of height or compression at T5, T6, T11 is unchanged from earlier this month. No acute thoracic vertebral fracture identified. Visible posterior ribs appear intact. Paraspinal and other soft tissues: Chest and abdomen are detailed separately. Thoracic paraspinal soft tissues remain within normal limits. Disc levels: Thoracic spine degeneration although mostly anterior flowing endplate osteophytes. Partially calcified disc and/or endplate spurring at T10-T11 with evidence of mild chronic thoracic spinal stenosis there. Superimposed facet hypertrophy. Spinal stenosis could be mild or moderate. IMPRESSION: 1. Stable CT appearance of the Thoracic Spine. Osteopenia, No acute traumatic injury identified. 2. 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 3. CT Chest, Abdomen, and Pelvis today reported separately. Electronically Signed   By: Odessa Fleming M.D.   On: 04/14/2023 07:14   CT CHEST ABDOMEN PELVIS WO CONTRAST  Result Date: 04/14/2023 CLINICAL DATA:  77 year old female fell, found down. On blood thinners. Hematoma. EXAM: CT CHEST, ABDOMEN AND PELVIS WITHOUT CONTRAST TECHNIQUE: Multidetector CT imaging of the chest, abdomen and pelvis was performed following the standard protocol without IV contrast. RADIATION DOSE REDUCTION: This exam was performed according to the departmental dose-optimization program which includes automated exposure control, adjustment of the mA and/or kV according to patient size and/or use of iterative reconstruction technique. COMPARISON:  Thoracic and lumbar CT today reported separately. Recent chest CT 04/07/2023. FINDINGS: CT CHEST FINDINGS Cardiovascular: Calcified aortic atherosclerosis. Calcified coronary artery atherosclerosis. Stable noncontrast appearance of the heart and  aorta. No cardiomegaly or pericardial effusion. Mediastinum/Nodes: Stable noncontrast mediastinum. No evidence of mass, lymphadenopathy, mediastinal hematoma. Lungs/Pleura: Layering right pleural effusion with simple fluid density remains moderate to large, slightly decreased compared to 04/07/2023. No contralateral left pleural effusion. Major airways remain patent. Compressive right lung atelectasis. Right lower lobe ventilation has mildly improved. No pneumothorax. Musculoskeletal: Left shoulder arthroplasty. Chronic posttraumatic and/or degenerative changes at the right shoulder. Chronic left rib fractures. Thoracic spine is reported separately. No sternal fracture. CT ABDOMEN PELVIS FINDINGS Hepatobiliary: Nodular liver contour suggestive of cirrhosis (series 2, image 69). No discrete liver lesion in the  absence of IV contrast. Cholecystectomy. Pancreas: Negative. Spleen: Negative, no splenomegaly.  No perisplenic fluid. Adrenals/Urinary Tract: Negative noncontrast adrenal glands, kidneys, bladder. Pelvic phleboliths. Stomach/Bowel: Postoperative changes to the right ventral abdominal wall, probably chronic cholecystectomy related. Small fat containing umbilical hernia series 2, image 83. Nearby ventral abdominal wall subcutaneous fat stranding which is probably subcutaneous injection sites (series 2, image 93). No herniated bowel. No dilated large or small bowel. Severe diverticulosis of the sigmoid colon in the pelvis. No active large bowel inflammation. Appendicoliths on series 2, image 93, but otherwise normal appendix (coronal image 44). Decompressed terminal ileum. Decompressed stomach. No free air or free fluid. No mesenteric inflammation identified. Vascular/Lymphatic: Extensive Aortoiliac calcified atherosclerosis. Vascular patency is not evaluated in the absence of IV contrast. Normal caliber abdominal aorta. No lymphadenopathy identified. Reproductive: Retroverted, otherwise negative noncontrast  appearance. Other: No pelvis free fluid. Musculoskeletal: Lumbar spine is detailed separately. Sacrum, SI joints, pelvis, and proximal femurs appear intact. No superficial soft tissue injury identified. IMPRESSION: 1. No acute traumatic injury identified in the noncontrast chest, abdomen, or pelvis. See CT thoracic and lumbar spine reported separately. 2. Moderate to large layering right pleural effusion has slightly decreased compared to 04/07/2023. Simple fluid density favors transudate (see #3). Compressive right lung atelectasis. 3. Nodular liver contour strongly suggests Cirrhosis. But no ascites, no splenomegaly. 4.  Aortic Atherosclerosis (ICD10-I70.0). Electronically Signed   By: Odessa Fleming M.D.   On: 04/14/2023 07:09   CT Cervical Spine Wo Contrast  Result Date: 04/14/2023 CLINICAL DATA:  77 year old female fell, found down. On blood thinners. Hematoma. EXAM: CT CERVICAL SPINE WITHOUT CONTRAST TECHNIQUE: Multidetector CT imaging of the cervical spine was performed without intravenous contrast. Multiplanar CT image reconstructions were also generated. RADIATION DOSE REDUCTION: This exam was performed according to the departmental dose-optimization program which includes automated exposure control, adjustment of the mA and/or kV according to patient size and/or use of iterative reconstruction technique. COMPARISON:  Head CT today.  Cervical spine CT 03/05/2023. FINDINGS: Alignment: Straightening of cervical lordosis. Cervicothoracic junction alignment is within normal limits. Bilateral posterior element alignment is within normal limits. Skull base and vertebrae: Visualized skull base is intact. No atlanto-occipital dissociation. C1 and C2 appear intact and aligned. No acute osseous abnormality identified. Soft tissues and spinal canal: No prevertebral fluid or swelling. No visible canal hematoma. Partially retropharyngeal carotids. Calcified atherosclerosis. Mild pharynx motion artifact. Disc levels: Stable  cervical spine degeneration. Multilevel disc and endplate degeneration with some ligamentous hypertrophy. 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 Upper chest: Thoracic spine and chest CT today reported separately. IMPRESSION: 1. No acute traumatic injury identified in the cervical spine. 2. 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 Electronically Signed   By: Odessa Fleming M.D.   On: 04/14/2023 06:59   CT HEAD WO CONTRAST ( )  Result Date: 04/14/2023 CLINICAL DATA:  77 year old female fell, found down. On blood thinners. Head hematoma. EXAM: CT HEAD WITHOUT CONTRAST TECHNIQUE: Contiguous axial images were obtained from the base of the skull through the vertex without intravenous contrast. RADIATION DOSE REDUCTION: This exam was performed according to the departmental dose-optimization program which includes automated exposure control, adjustment of the mA and/or kV according to patient size and/or use of iterative reconstruction technique. COMPARISON:  Head CT 03/05/2023. FINDINGS: Brain: Cerebral volume is stable, within normal limits for age. No midline shift, ventriculomegaly, mass effect, evidence of mass lesion, intracranial hemorrhage or evidence of cortically based acute infarction. Stable mild for age patchy white  matter hypodensity. Otherwise normal gray-white differentiation. Vascular: Calcified atherosclerosis at the skull base. No suspicious intracranial vascular hyperdensity. Skull: Stable and intact. Sinuses/Orbits: Visualized paranasal sinuses and mastoids are stable and well aerated. Other: There is a mild right posterior convexity scalp hematoma or contusion on series 3, image 35. Underlying calvarium appears intact. No other acute orbit or scalp soft tissue injury identified. IMPRESSION: 1. Right posterior convexity scalp hematoma or contusion. No skull fracture. 2. No acute intracranial abnormality. Stable and largely negative for age  non contrast CT appearance of the brain. Electronically Signed   By: Odessa Fleming M.D.   On: 04/14/2023 06:55   DG Chest Port 1 View  Result Date: 04/12/2023 CLINICAL DATA:  Cough, shortness of breath EXAM: PORTABLE CHEST 1 VIEW COMPARISON:  04/11/2023 FINDINGS: Heart size is normal. Dense mitral annulus calcifications. Benign calcified granulomatous mediastinal lymph nodes. Implantable loop recorder. No acute airspace opacity. Benign calcified nodule specific status post left shoulder reverse arthroplasty. IMPRESSION: No acute abnormality of the lungs in AP portable projection. Electronically Signed   By: Jearld Lesch M.D.   On: 04/12/2023 09:10   DG Chest Port 1 View  Result Date: 04/11/2023 CLINICAL DATA:  Pleural effusion. EXAM: PORTABLE CHEST 1 VIEW COMPARISON:  April 10, 2023. FINDINGS: The heart size and mediastinal contours are within normal limits. Status post left shoulder arthroplasty. Old left rib fractures are noted. Increased right basilar opacity is noted concerning for worsening effusion and associated atelectasis or infiltrate. Left lung is unremarkable. IMPRESSION: Increased right basilar opacity is noted concerning for worsening effusion and associated atelectasis or infiltrate. Electronically Signed   By: Lupita Raider M.D.   On: 04/11/2023 08:51   ECHOCARDIOGRAM COMPLETE  Result Date: 04/10/2023    ECHOCARDIOGRAM REPORT   Patient Name:   ARMANNI HARPOLE Date of Exam: 04/09/2023 Medical Rec #:  [id number]      Height:       62.0 in Accession #:    [id number]     Weight:       141.5 lb Date of Birth:  12-22-1945       BSA:          1.650 m Patient Age:    77 years       BP:           121/76 mmHg Patient Gender: F              HR:           58 bpm. Exam Location:  [facility] Procedure: 2D Echo, Cardiac Doppler and Color Doppler Indications:    DOE R06.0  History:        Patient has prior history of Echocardiogram examinations, most                 recent 05/24/2022. COPD, Arrythmias:Atrial  Fibrillation; Risk                 Factors:Hypertension and Diabetes.  Sonographer:    Dondra Prader RVT RCS Referring Phys: [id number] Sunnie Nielsen  Sonographer Comments: Patient unable to tolerate probe pressure on abdomen; no subcostal views. IMPRESSIONS  1. Left ventricular ejection fraction, by estimation, is 55 to 60%. The left ventricle has normal function. The left ventricle has no regional wall motion abnormalities. Left ventricular diastolic parameters are consistent with Grade I diastolic dysfunction (impaired relaxation).  2. Right ventricular systolic function is normal. The right ventricular size is normal. There is normal pulmonary artery systolic pressure.  3.  Left atrial size was mildly dilated.  4. The mitral valve is normal in structure. Mild mitral valve regurgitation. Mild to moderate mitral stenosis. The mean mitral valve gradient is 6.0 mmHg. Severe mitral annular calcification.  5. The aortic valve is normal in structure. Aortic valve regurgitation is trivial. No aortic stenosis is present.  6. The inferior vena cava is normal in size with greater than 50% respiratory variability, suggesting right atrial pressure of 3 mmHg. FINDINGS  Left Ventricle: Left ventricular ejection fraction, by estimation, is 55 to 60%. The left ventricle has normal function. The left ventricle has no regional wall motion abnormalities. The left ventricular internal cavity size was normal in size. There is  no left ventricular hypertrophy. Left ventricular diastolic parameters are consistent with Grade I diastolic dysfunction (impaired relaxation). Right Ventricle: The right ventricular size is normal. No increase in right ventricular wall thickness. Right ventricular systolic function is normal. There is normal pulmonary artery systolic pressure. The tricuspid regurgitant velocity is 2.31 m/s, and  with an assumed right atrial pressure of 3 mmHg, the estimated right ventricular systolic pressure is 24.3 mmHg. Left  Atrium: Left atrial size was mildly dilated. Right Atrium: Right atrial size was normal in size. Pericardium: There is no evidence of pericardial effusion. Mitral Valve: The mitral valve is normal in structure. There is moderate thickening of the mitral valve leaflet(s). There is moderate calcification of the mitral valve leaflet(s). Severe mitral annular calcification. Mild mitral valve regurgitation. Mild  to moderate mitral valve stenosis. The mean mitral valve gradient is 6.0 mmHg. Tricuspid Valve: The tricuspid valve is normal in structure. Tricuspid valve regurgitation is mild . No evidence of tricuspid stenosis. Aortic Valve: The aortic valve is normal in structure. Aortic valve regurgitation is trivial. Aortic regurgitation PHT measures 600 msec. No aortic stenosis is present. Aortic valve mean gradient measures 7.0 mmHg. Aortic valve peak gradient measures 12.7 mmHg. Aortic valve area, by VTI measures 1.18 cm. Pulmonic Valve: The pulmonic valve was normal in structure. Pulmonic valve regurgitation is not visualized. No evidence of pulmonic stenosis. Aorta: The aortic root is normal in size and structure. Venous: The inferior vena cava is normal in size with greater than 50% respiratory variability, suggesting right atrial pressure of 3 mmHg. IAS/Shunts: No atrial level shunt detected by color flow Doppler.  LEFT VENTRICLE PLAX 2D LVIDd:         4.30 cm   Diastology LVIDs:         3.10 cm   LV e' medial:    4.56 cm/s LV PW:         1.00 cm   LV E/e' medial:  24.6 LV IVS:        0.80 cm   LV e' lateral:   4.87 cm/s LVOT diam:     1.40 cm   LV E/e' lateral: 23.0 LV SV:         43 LV SV Index:   26 LVOT Area:     1.54 cm  RIGHT VENTRICLE RV Basal diam:  2.60 cm RV S prime:     11.30 cm/s TAPSE (M-mode): 1.7 cm LEFT ATRIUM             Index        RIGHT ATRIUM          Index LA diam:        4.40 cm 2.67 cm/m   RA Area:     7.81 cm LA Vol (A2C):   50.9  ml 30.84 ml/m  RA Volume:   13.80 ml 8.36 ml/m LA Vol  (A4C):   42.4 ml 25.69 ml/m LA Biplane Vol: 49.3 ml 29.87 ml/m  AORTIC VALVE                     PULMONIC VALVE AV Area (Vmax):    1.37 cm      PV Vmax:       1.06 m/s AV Area (Vmean):   1.26 cm      PV Peak grad:  4.5 mmHg AV Area (VTI):     1.18 cm AV Vmax:           178.00 cm/s AV Vmean:          116.000 cm/s AV VTI:            0.369 m AV Peak Grad:      12.7 mmHg AV Mean Grad:      7.0 mmHg LVOT Vmax:         158.00 cm/s LVOT Vmean:        94.800 cm/s LVOT VTI:          0.282 m LVOT/AV VTI ratio: 0.76 AI PHT:            600 msec  AORTA Ao Root diam: 2.80 cm Ao Asc diam:  3.60 cm MITRAL VALVE                TRICUSPID VALVE MV Area (PHT): 2.07 cm     TR Peak grad:   21.3 mmHg MV Mean grad:  6.0 mmHg     TR Vmax:        231.00 cm/s MV Decel Time: 366 msec MR Peak grad: 11.8 mmHg     SHUNTS MR Mean grad: 6.0 mmHg      Systemic VTI:  0.28 m MR Vmax:      172.00 cm/s   Systemic Diam: 1.40 cm MR Vmean:     116.0 cm/s MV E velocity: 112.00 cm/s MV A velocity: 167.00 cm/s MV E/A ratio:  0.67 Lorine Bears MD Electronically signed by Lorine Bears MD Signature Date/Time: 04/10/2023/5:17:03 PM    Final    DG Chest Port 1 View  Result Date: 04/10/2023 CLINICAL DATA:  142230 Pleural effusion 142230. EXAM: PORTABLE CHEST 1 VIEW COMPARISON:  Chest radiograph 04/09/2023. FINDINGS: Decreased, trace residual right pleural effusion. Increasing airspace opacity in the medial aspect of the right lung base, suspicious for aspiration or developing infection. No pneumothorax. IMPRESSION: Decreased, trace residual right pleural effusion. Increasing airspace opacity in the medial aspect of the right lung base, suspicious for aspiration or developing infection. Electronically Signed   By: Orvan Falconer M.D.   On: 04/10/2023 12:47   DG Chest Port 1 View  Result Date: 04/09/2023 CLINICAL DATA:  Post thoracentesis EXAM: PORTABLE CHEST 1 VIEW COMPARISON:  CT chest 04/07/2023, chest x-ray 05/22/2022 FINDINGS: Left-sided  electronic recording device. Normal cardiac size. Mitral annular calcifications. Left lung grossly clear. Decreased right pleural effusion with small moderate residual pleural effusion. No convincing right pneumothorax. Airspace disease at the right base. Aortic atherosclerosis. Old appearing left-sided rib fractures. Left shoulder replacement IMPRESSION: Decreased right pleural effusion with small to moderate residual pleural effusion. No convincing pneumothorax. Airspace disease at the right base, atelectasis versus pneumonia. Electronically Signed   By: Jasmine Pang M.D.   On: 04/09/2023 16:27   US Abdomen Limited RUQ (LIVER/GB)  Result Date: 04/09/2023 CLINICAL DATA:  Cirrhosis EXAM: ULTRASOUND  ABDOMEN LIMITED RIGHT UPPER QUADRANT COMPARISON:  None Available. FINDINGS: Gallbladder: Prior cholecystectomy. Common bile duct: Diameter: 6 mm. Liver: No focal lesion identified. Nodular liver contour. Normal parenchymal echogenicity. Portal vein is patent on color Doppler imaging with normal direction of blood flow towards the liver. Other: Right pleural effusion. IMPRESSION: 1. Cirrhotic liver morphology. 2. Right pleural effusion. Electronically Signed   By: Allegra Lai M.D.   On: 04/09/2023 12:59   DG Foot Complete Right  Result Date: 04/08/2023 CLINICAL DATA:  Right foot pain EXAM: RIGHT FOOT COMPLETE - 3+ VIEW COMPARISON:  None Available. FINDINGS: No fracture or dislocation is seen. Mild degenerative changes of the 1st MCP joint. Degenerative changes of the dorsal midfoot. Moderate posterior and plantar enthesophytes. IMPRESSION: No fracture or dislocation is seen. Degenerative changes, as above. Electronically Signed   By: Charline Bills M.D.   On: 04/08/2023 01:55   CT CHEST WO CONTRAST  Result Date: 04/07/2023 CLINICAL DATA:  Chest pain, shortness of breath EXAM: CT CHEST WITHOUT CONTRAST TECHNIQUE: Multidetector CT imaging of the chest was performed following the standard protocol without  IV contrast. RADIATION DOSE REDUCTION: This exam was performed according to the departmental dose-optimization program which includes automated exposure control, adjustment of the mA and/or kV according to patient size and/or use of iterative reconstruction technique. COMPARISON:  CT 05/23/2022 FINDINGS: Cardiovascular: Heart size is upper limits of normal. No pericardial effusion. Thoracic aorta is nonaneurysmal. There are atherosclerotic vascular calcifications of the aorta and coronary arteries. Calcification of the mitral annulus. Central pulmonary vasculature is nondilated. Mediastinum/Nodes: No enlarged mediastinal or axillary lymph nodes. Thyroid gland, trachea, and esophagus demonstrate no significant findings. Small hiatal hernia. Lungs/Pleura: Moderate-large layering right-sided pleural effusion with complete atelectasis of the right lower lobe and near-complete atelectasis of the right middle lobe. Partial compressive atelectasis of the right upper lobe. Mild mosaic attenuation of the aerated portion of the right upper lobe and within the left lung. No left-sided pleural effusion. No pneumothorax. Upper Abdomen: Nodular hepatic surface contour. No acute abnormality. Musculoskeletal: No chest wall mass or suspicious bone lesions identified. Left chest wall loop recorder. IMPRESSION: 1. Moderate-large layering right-sided pleural effusion with complete atelectasis of the right lower lobe and near-complete atelectasis of the right middle lobe. Partial compressive atelectasis of the right upper lobe. 2. Mild mosaic attenuation of the aerated portion of the right upper lobe and within the left lung, which may be related to small airways disease. 3. Nodular hepatic surface contour, suggestive of cirrhosis. 4. Aortic and coronary artery atherosclerosis (ICD10-I70.0). Electronically Signed   By: Duanne Guess D.O.   On: 04/07/2023 15:25    Labs:  CBC: Recent Labs    04/21/23 0529 04/22/23 0347  04/23/23 0520 04/24/23 0434  WBC 10.1 6.1 5.7 7.5  HGB 11.0* 10.3* 10.6* 10.2*  HCT 33.3* 31.2* 31.8* 31.1*  PLT 147* 142* 134* 163    COAGS: Recent Labs    05/22/22 1202 04/07/23 2313 04/08/23 1029 04/08/23 2009 04/09/23 0459  INR 1.3* 1.7*  --   --   --   APTT 37* 42* 120* 97* 60*    BMP: Recent Labs    04/21/23 0529 04/22/23 0347 04/23/23 0520 04/24/23 0434  NA 134* 135 136 135  K 4.3 4.1 3.8 4.1  CL 98 99 98 96*  CO2 26 26 27 30   GLUCOSE 128* 96 119* 200*  BUN 16 13 11 17   CALCIUM 8.2* 8.1* 8.2* 8.4*  CREATININE 0.72 0.66 0.61 0.79  GFRNONAA >  60 >60 >60 >60    LIVER FUNCTION TESTS: Recent Labs    04/13/23 1213 04/14/23 0627 04/15/23 0415 04/20/23 2002  BILITOT 0.3 0.7 0.3 0.5  AST 24 29 13* 20  ALT 20 14 14 12   ALKPHOS 42 37* 30* 42  PROT 7.3 7.1 5.5* 7.1  ALBUMIN 3.8 3.4* 2.8* 3.4*    Assessment and Plan:  77 y.o. female inpatient. History of hypothyroidism, HTN, GERD, cirrhosis, DVT, DM, COPD ( on 3L of 02). 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 Patient presented to the ED at [facility] on 11.15.24 with [facility]. Found to have a pleural effusion and PNA. IR performed a right sided thoracentesis on 11.15.24 with cytology resulting in lymphoma. Team is requesting a bone marrow biopsy for further evaluation of lymphoma and staging.   IR consulted for possible bone marrow biopsy. Patient tentatively scheduled for 11.19.24.  Team instructed to: Keep Patient to be NPO after midnight  IR will call patient when ready.  Risks and benefits of bone marrow was discussed with the patient and/or patient's family including, but not limited to bleeding, infection, damage to adjacent structures or low yield requiring additional tests.  All of the questions were answered and there is agreement to proceed.  Consent signed and in chart.   Thank you for this interesting consult.  I greatly enjoyed meeting [name] and look forward to  participating in their care.  A copy of this report was sent to the requesting provider on this date.  Electronically Signed: Alene Mires, NP 04/24/2023, 4:05 PM   I spent a total of 20 Minutes    in face to face in clinical consultation, greater than 50% of which was counseling/coordinating care for bone marrow biopsy

## 2023-04-24 NOTE — TOC Initial Note (Signed)
Transition of Care [facility]) - Initial/Assessment Note    Patient Details  Name: [name] MRN: [id number] Date of Birth: June 15, 1945  [facility] of [facility]) CM/SW Contact:    Chapman Fitch, RN Phone Number: 04/24/2023, 2:24 PM  Clinical Narrative:                    Patient from home with PACE services Therapy is recommending SNF.  Patient in agreement Spoke with Robin at [facility], she is going to present recommendations to the team, and report back to [facility] the plan      Patient Goals and CMS Choice            Expected Discharge Plan and Services                                              Prior Living Arrangements/Services                       Activities of Daily Living   ADL Screening (condition at time of admission) Independently performs ADLs?: No Does the patient have a NEW difficulty with bathing/dressing/toileting/self-feeding that is expected to last >3 days?: No Does the patient have a NEW difficulty with getting in/out of bed, walking, or climbing stairs that is expected to last >3 days?: No Does the patient have a NEW difficulty with communication that is expected to last >3 days?: No Is the patient deaf or have difficulty hearing?: Yes Does the patient have difficulty seeing, even when wearing glasses/contacts?: No Does the patient have difficulty concentrating, remembering, or making decisions?: No  Permission Sought/Granted                  Emotional Assessment              Admission diagnosis:  Shortness of breath [R06.02] Pleural effusion [J90] Hypoxia [R09.02] Recurrent right pleural effusion [J90] Community acquired pneumonia, unspecified laterality [J18.9] Pneumonia [J18.9] Patient Active Problem List   Diagnosis Date Noted   Recurrent right pleural effusion 04/21/2023   Acute on chronic respiratory failure with hypoxia (HCC) 04/21/2023   GERD without esophagitis 04/21/2023   Paroxysmal atrial  fibrillation (HCC) 04/21/2023   Pneumonia 04/21/2023   Anxiety 04/17/2023   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   Sacral fracture, closed (HCC) 04/15/2023   Fall 04/14/2023   Lumbar compression fracture (HCC) 04/14/2023   Unintentional weight loss 04/13/2023   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   Mood disorder (HCC) 04/13/2023   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) 04/12/2023   Pleural effusion on right 04/07/2023   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   Chronic kidney disease, stage 3a (HCC) 04/07/2023   Atrial fibrillation, chronic (HCC) 04/07/2023   DVT (deep venous thrombosis) (HCC) 04/07/2023   Leukocytosis 04/07/2023   Right foot pain 04/07/2023   Acute sinusitis 05/23/2022   COPD exacerbation (HCC) 05/22/2022   Atrial fibrillation with RVR (HCC) 05/22/2022   Chest pain 05/22/2022   HTN (hypertension) 05/22/2022   Diabetes mellitus without complication (HCC) 05/22/2022   COPD (chronic obstructive pulmonary disease) (HCC) 05/22/2022   Hypothyroidism 05/22/2022   Iron deficiency anemia 05/22/2022   Osteoarthritis of right knee 03/14/2022   PCP:  [employer] Pharmacy:  [facility] [place], [place] - [address] [place] [address] Phone: [phone] Fax: [phone]     Social Determinants of Health (SDOH) Social History: SDOH Screenings   Food Insecurity: No Food Insecurity (04/21/2023)  Housing: Low Risk  (04/21/2023)  Transportation Needs: No Transportation Needs (04/21/2023)  Utilities: Not At Risk (04/21/2023)  Depression (PHQ2-9): Low Risk  (04/13/2023)  Tobacco Use: Low Risk  (04/21/2023)   SDOH Interventions:     Readmission Risk Interventions    04/10/2023    1:51 PM  Readmission Risk Prevention Plan  Transportation Screening Complete  PCP or Specialist  Appt within 3-5 Days Complete  Palliative Care Screening Complete  Medication Review (RN Care Manager) Complete

## 2023-04-24 NOTE — Care Management Important Message (Signed)
Important Message  Patient Details  Name: [name] MRN: [id number] Date of Birth: March 01, 1946   Important Message Given:  N/[name] - LOS <3 / Initial given by admissions     [name] [name] 04/24/2023, 9:10 AM

## 2023-04-25 ENCOUNTER — Other Ambulatory Visit (HOSPITAL_COMMUNITY): Payer: Self-pay | Admitting: Student

## 2023-04-25 DIAGNOSIS — D72820 Lymphocytosis (symptomatic): Secondary | ICD-10-CM

## 2023-04-25 DIAGNOSIS — C8590 Non-Hodgkin lymphoma, unspecified, unspecified site: Secondary | ICD-10-CM | POA: Diagnosis not present

## 2023-04-25 DIAGNOSIS — J90 Pleural effusion, not elsewhere classified: Secondary | ICD-10-CM | POA: Diagnosis not present

## 2023-04-25 LAB — CBC WITH DIFFERENTIAL/PLATELET
Abs Immature Granulocytes: 0.06 10*3/uL (ref 0.00–0.07)
Basophils Absolute: 0 10*3/uL (ref 0.0–0.1)
Basophils Relative: 0 %
Eosinophils Absolute: 0 10*3/uL (ref 0.0–0.5)
Eosinophils Relative: 0 %
HCT: 30 % — ABNORMAL LOW (ref 36.0–46.0)
Hemoglobin: 9.9 g/dL — ABNORMAL LOW (ref 12.0–15.0)
Immature Granulocytes: 1 %
Lymphocytes Relative: 6 %
Lymphs Abs: 0.6 10*3/uL — ABNORMAL LOW (ref 0.7–4.0)
MCH: 30.7 pg (ref 26.0–34.0)
MCHC: 33 g/dL (ref 30.0–36.0)
MCV: 92.9 fL (ref 80.0–100.0)
Monocytes Absolute: 0.3 10*3/uL (ref 0.1–1.0)
Monocytes Relative: 3 %
Neutro Abs: 9.6 10*3/uL — ABNORMAL HIGH (ref 1.7–7.7)
Neutrophils Relative %: 90 %
Platelets: 187 10*3/uL (ref 150–400)
RBC: 3.23 MIL/uL — ABNORMAL LOW (ref 3.87–5.11)
RDW: 13 % (ref 11.5–15.5)
WBC: 10.5 10*3/uL (ref 4.0–10.5)
nRBC: 0 % (ref 0.0–0.2)

## 2023-04-25 LAB — BODY FLUID CULTURE W GRAM STAIN: Culture: NO GROWTH

## 2023-04-25 LAB — MAGNESIUM: Magnesium: 2.2 mg/dL (ref 1.7–2.4)

## 2023-04-25 LAB — BASIC METABOLIC PANEL
Anion gap: 9 (ref 5–15)
BUN: 25 mg/dL — ABNORMAL HIGH (ref 8–23)
CO2: 29 mmol/L (ref 22–32)
Calcium: 8.3 mg/dL — ABNORMAL LOW (ref 8.9–10.3)
Chloride: 95 mmol/L — ABNORMAL LOW (ref 98–111)
Creatinine, Ser: 0.74 mg/dL (ref 0.44–1.00)
GFR, Estimated: >60 mL/min (ref >60)
Glucose, Bld: 253 mg/dL — ABNORMAL HIGH (ref 70–99)
Potassium: 4.2 mmol/L (ref 3.5–5.1)
Sodium: 133 mmol/L — ABNORMAL LOW (ref 135–145)

## 2023-04-25 LAB — PHOSPHORUS: Phosphorus: 3.1 mg/dL (ref 2.5–4.6)

## 2023-04-25 MED ORDER — AZITHROMYCIN 250 MG PO TABS
500.0000 mg | ORAL_TABLET | Freq: Every day | ORAL | Status: DC
  Administered 2023-04-25 – 2023-04-26 (×2): 500 mg via ORAL
  Filled 2023-04-25 (×2): qty 2

## 2023-04-25 MED ORDER — GUAIFENESIN ER 600 MG PO TB12
600.0000 mg | ORAL_TABLET | Freq: Two times a day (BID) | ORAL | Status: DC
  Administered 2023-04-25 – 2023-04-28 (×7): 600 mg via ORAL
  Filled 2023-04-25 (×7): qty 1

## 2023-04-25 MED ORDER — IPRATROPIUM-ALBUTEROL 0.5-2.5 (3) MG/3ML IN SOLN
3.0000 mL | Freq: Two times a day (BID) | RESPIRATORY_TRACT | Status: DC
  Administered 2023-04-25 – 2023-04-28 (×6): 3 mL via RESPIRATORY_TRACT
  Filled 2023-04-25 (×2): qty 3
  Filled 2023-04-25: qty 39
  Filled 2023-04-25 (×3): qty 3

## 2023-04-25 MED ORDER — ALPRAZOLAM 0.25 MG PO TABS
0.2500 mg | ORAL_TABLET | Freq: Two times a day (BID) | ORAL | Status: DC | PRN
  Administered 2023-04-26: 0.25 mg via ORAL
  Filled 2023-04-25: qty 1

## 2023-04-25 NOTE — Progress Notes (Signed)
PROGRESS NOTE   HPI was taken from Dr. Arville Care: [name] is a 77 y.o. female with medical history significant for asthma, COPD, type diabetes mellitus, GERD, hypertension chronic hypoxic respiratory failure on home O2 at baseline of 2 L/min, and hypothyroidism, who presented to the emergency room with acute onset of worsening dyspnea with associated dry cough with right-sided chest pain with deep breathing.  The patient has been recently evaluated for lymphoma and had a PET scan on 04/19/2023 which report is currently pending.  She is scheduled to have bone marrow biopsy on 11/20.  She is at previous thoracentesis for right-sided pleural effusion.  She admits to chills without measured fever.  No nausea or vomiting or abdominal pain.  No bleeding diathesis.  No dysuria, oliguria or hematuria or flank pain.  She stated that she has been requiring 3 L of O2 by nasal cannula and today increased to 5 L.   ED Course: When she came to the ER, vital signs were within normal.  Pulsoxymeter was 95% on 3 L of O2 by nasal cannula that is above her baseline.  Labs revealed unremarkable CBC.  CMP revealed mild hyponatremia and hypochloremia with calcium of 8.3 and albumin 3.4 otherwise unremarkable CMP.  EKG as reviewed by me : ..  EKG showed normal sinus rhythm with a rate of 65 with right superior axis deviation and incomplete right bundle branch block. Imaging: Portable chest x-ray showed moderate to large right pleural effusion and associated airspace opacities.   The patient was given IV Rocephin and Zithromax, 25 mcg of IV fentanyl and 4 mg of IV Zofran.  She will be admitted to a medical telemetry observation bed for further evaluation and management.  As per Dr. Mayford Knife 11/15-11/19/24: Pt came in w/ shortness of breath and found to have pneumonia and right pleural effusion which is recurrent. Pt is s/p right thoracentesis w/ fluid studies pending. Pleural fluid cx NGTD. Pleural fluid path is pending.  Pt is on IV abxs, IV steroids, bronchodilators for pneumonia. Of note, pt high suspicion for B cell lymphoma but is scheduled to have bone marrow biopsy tomorrow via IR tomorrow as per onco. Please see onco's note for more info.     [name]  BMW:[id number] DOB: July 07, 1945 DOA: 04/21/2023 PCP: [facility]    Assessment & Plan:   Principal Problem:   Recurrent right pleural effusion Active Problems:   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)   Hypothyroidism   Acute on chronic respiratory failure with hypoxia (HCC)   GERD without esophagitis   Paroxysmal atrial fibrillation (HCC)   Pneumonia   Shortness of breath   Community acquired pneumonia  Assessment and Plan:  Recurrent right pleural effusion: likely secondary to concern for B cell lymphoma. Bone marrow biopsy tomorrow. S/p thoracentesis. Pleural fluid cx NGTD. Pleural fluid path is pending.   Pneumonia: continue on IV rocephin, azithromycin, steroids & encourage incentive spirometry. Tussionex prn for cough. 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 Bone marrow biopsy tomorrow w/ IR. Onco following and recs apprec   Hx of cirrhosis: as per pt's family. Continue on lasix   Thrombocytopenia: likely secondary to cirrhosis. WNL today   Anxiety: severity. Seems significant. Xanax prn.   CKDIIIa: Cr is better than baseline    Hypothyroidism: continue on home dose of levothyroxine  PAF: continue on amio, eliquis    GERD: continue on PPI   Acute on chronic hypoxic respiratory failure: continue on supplemental oxygen and wean back to baseline as tolerated.       DVT prophylaxis: eliquis  Code Status: full  Family Communication:  Disposition Plan: likely d/c back home w/ HH   Level of care: Telemetry Medical  Status  is: Inpatient Remains inpatient appropriate because: severity of illness      Consultants:  Onco   Procedures:   Antimicrobials: azithromycin, rocephin    Subjective: Pt c/o anxiety   Objective: Vitals:   04/24/23 1812 04/24/23 1936 04/24/23 2040 04/25/23 0726  BP:   129/62   Pulse:   88   Resp: 16  16   Temp:   97.6 F (36.4 C)   TempSrc:      SpO2:  95% 96% 98%  Weight:      Height:        Intake/Output Summary (Last 24 hours) at 04/25/2023 0825 Last data filed at 04/24/2023 2041 Gross per 24 hour  Intake 360 ml  Output 300 ml  Net 60 ml   Filed Weights   04/20/23 1949  Weight: 61.2 kg    Examination:  General exam: appears anxious  Respiratory system: decreased breath sounds b/l  Cardiovascular system: S1 & S2+ Gastrointestinal system: abd is soft, NT, ND & hypoactive bowel sounds  Central nervous system: alert & oriented. Moves all extremities  Psychiatry: judgement and insight appears at baseline. Anxious mood and affect    Data Reviewed: I have personally reviewed following labs and imaging studies  CBC: Recent Labs  Lab 04/20/23 2002 04/21/23 0529 04/22/23 0347 04/23/23 0520 04/24/23 0434 04/25/23 0426  WBC 10.2 10.1 6.1 5.7 7.5 10.5  NEUTROABS 7.2  --   --   --   --  9.6*  HGB 12.4 11.0* 10.3* 10.6* 10.2* 9.9*  HCT 38.5 33.3* 31.2* 31.8* 31.1* 30.0*  MCV 93.7 92.2 92.0 91.6 92.8 92.9  PLT 169 147* 142* 134* 163 187   Basic Metabolic Panel: Recent Labs  Lab 04/21/23 0529 04/22/23 0347 04/23/23 0520 04/24/23 0434 04/25/23 0426  NA 134* 135 136 135 133*  K 4.3 4.1 3.8 4.1 4.2  CL 98 99 98 96* 95*  CO2 26 26 27 30 29   GLUCOSE 128* 96 119* 200* 253*  BUN 16 13 11 17  25*  CREATININE 0.72 0.66 0.61 0.79 0.74  CALCIUM 8.2* 8.1* 8.2* 8.4* 8.3*  MG  --   --   --   --  2.2  PHOS  --   --   --   --  3.1   GFR: Estimated Creatinine Clearance: 49.9 mL/min (by C-G formula based on SCr of 0.74 mg/dL). Liver Function Tests: Recent  Labs  Lab 04/20/23 2002  AST 20  ALT 12  ALKPHOS 42  BILITOT 0.5  PROT 7.1  ALBUMIN 3.4*   No results for input(s): "LIPASE", "AMYLASE" in the last 168 hours. No results for input(s): "AMMONIA" in the last 168 hours. Coagulation Profile: No results for input(s): "INR", "PROTIME" in the last 168 hours. Cardiac Enzymes: No results for input(s): "CKTOTAL", "CKMB", "CKMBINDEX", "TROPONINI" in the last 168 hours. BNP (last 3 results) No results for input(s): "PROBNP" in the last 8760 hours. HbA1C: No results for input(s): "HGBA1C" in the last 72 hours. CBG: Recent Labs  Lab 04/19/23 1040  GLUCAP 119*   Lipid Profile: No results for input(s): "CHOL", "HDL", "LDLCALC", "TRIG", "CHOLHDL", "  LDLDIRECT" in the last 72 hours. Thyroid Function Tests: No results for input(s): "TSH", "T4TOTAL", "FREET4", "T3FREE", "THYROIDAB" in the last 72 hours. Anemia Panel: No results for input(s): "VITAMINB12", "FOLATE", "FERRITIN", "TIBC", "IRON", "RETICCTPCT" in the last 72 hours. Sepsis Labs: Recent Labs  Lab 04/21/23 0152  LATICACIDVEN 1.3    Recent Results (from the past 240 hour(s))  Culture, blood (routine x 2)     Status: None (Preliminary result)   Collection Time: 04/21/23  1:47 AM   Specimen: BLOOD  Result Value Ref Range Status   Specimen Description BLOOD BLOOD RIGHT FOREARM  Final   Special Requests   Final    BOTTLES DRAWN AEROBIC AND ANAEROBIC Blood Culture adequate volume   Culture   Final    NO GROWTH 4 DAYS Performed at [facility], [address], [place], [place] [address]    Report Status PENDING  Incomplete  Culture, blood (routine x 2)     Status: None (Preliminary result)   Collection Time: 04/21/23  1:51 AM   Specimen: BLOOD  Result Value Ref Range Status   Specimen Description BLOOD WEAKLY REACTIVE  Final   Special Requests   Final    BOTTLES DRAWN AEROBIC AND ANAEROBIC Blood Culture adequate volume   Culture   Final    NO GROWTH 4  DAYS Performed at [facility], [address] [place], [place] [address]    Report Status PENDING  Incomplete  Body fluid culture w Gram Stain     Status: None (Preliminary result)   Collection Time: 04/21/23  3:56 PM   Specimen: PATH Cytology Pleural fluid  Result Value Ref Range Status   Specimen Description   Final    PLEURAL Performed at [facility], [address] [place], [place] [address]    Special Requests   Final    NONE Performed at [facility], [address] [place], [place] [address]    Gram Stain   Final    WBC PRESENT, PREDOMINANTLY MONONUCLEAR NO ORGANISMS SEEN    Culture   Final    NO GROWTH 3 DAYS Performed at [facility], [address] [place], [place] [address]    Report Status PENDING  Incomplete         Radiology Studies: No results found.      Scheduled Meds:  ALPRAZolam  0.25 mg Oral BID   amiodarone  100 mg Oral Daily   apixaban  2.5 mg Oral BID   ascorbic acid  1,500 mg Oral Daily   feeding supplement  237 mL Oral TID BM   fentaNYL  1 patch Transdermal Q72H   ferrous sulfate  325 mg Oral Daily   furosemide  40 mg Intravenous Daily   ipratropium-albuterol  3 mL Nebulization TID   levothyroxine  125 mcg Oral QAC breakfast   magnesium oxide  200 mg Oral Daily   methylPREDNISolone (SOLU-MEDROL) injection  40 mg Intravenous Q8H   multivitamin with minerals  1 tablet Oral Daily   omega-3 acid ethyl esters  1 capsule Oral Daily   pantoprazole  40 mg Oral Daily   polyvinyl alcohol  2 drop Both Eyes Daily   Continuous Infusions:  azithromycin 500 mg (04/24/23 2113)   cefTRIAXone (ROCEPHIN)  IV 2 g (04/24/23 2114)     LOS: 4 days       Erin Killian, MD [facility] Pager [phone]  If 7PM-7AM, please contact night-coverage [email or web address] 04/25/2023, 8:25 AM

## 2023-04-25 NOTE — Progress Notes (Signed)
PT Cancellation Note  Patient Details Name: [name] MRN: [id number] DOB: 1945-10-21   Cancelled Treatment:    Reason Eval/Treat Not Completed: Fatigue/lethargy limiting ability to participate  Offered session this am.  She stated she was feeling poorly from poor sleep last night and requested to delay session.  Later this am observed pt walking with mobility specialist.  Will return at a later time/date.   Danielle Dess 04/25/2023, 1:38 PM

## 2023-04-25 NOTE — TOC Progression Note (Addendum)
Transition of Care [facility]) - Progression Note    Patient Details  Name: Yaritza Danner MRN: [id number] Date of Birth: [birth year]  Transition of Care [facility]) CM/SW Contact  Chapman Fitch, RN Phone Number: 04/25/2023, 10:28 AM  Clinical Narrative:     Per Zella Ball at [facility] she anticipates they will be moving forward with SNF placement.  She is going to confirm and notify TOC  She states they are in network with [facility]   PASRR obtained,  Fl2 sent for [employer] search initiated      Update : Robin at [employer] confirms to move forward with SNF work up  Expected Discharge Plan and Services                                               Social Determinants of Health (SDOH) Interventions SDOH Screenings   Food Insecurity: No Food Insecurity (04/21/2023)  Housing: Low Risk  (04/21/2023)  Transportation Needs: No Transportation Needs (04/21/2023)  Utilities: Not At Risk (04/21/2023)  Depression (PHQ2-9): Low Risk  (04/13/2023)  Tobacco Use: Low Risk  (04/21/2023)    Readmission Risk Interventions    04/24/2023    3:33 PM 04/10/2023    1:51 PM  Readmission Risk Prevention Plan  Transportation Screening Complete Complete  PCP or Specialist Appt within 3-5 Days  Complete  Palliative Care Screening  Complete  Medication Review (RN Care Manager) Complete Complete  Skilled Nursing Facility Complete

## 2023-04-25 NOTE — Progress Notes (Signed)
Initial Nutrition Assessment  DOCUMENTATION CODES:   Not applicable  INTERVENTION:   Ensure Enlive po TID, each supplement provides 350 kcal and 20 grams of protein.  Magic cup TID with meals, each supplement provides 290 kcal and 9 grams of protein  MVI po daily   Pt at high refeed risk; recommend monitor potassium, magnesium and phosphorus labs daily until stable  Daily weights   NUTRITION DIAGNOSIS:   Increased nutrient needs related to catabolic illness as evidenced by estimated needs.  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  Pt s/p paracentesis 11/15 with output   Met with pt in room today. Pt reports decreased appetite and oral intake over the past several months. Pt reports ongoing falls that have resulted in sacral and compression fractures. Pt reports that she has been struggling with immobility, GERD and constipation that has been decreasing her appetite and oral intake. Pt reports a 20lb weight loss over the past month. Per chart, pt is down 18lbs(12%) over the past year and is down 11lbs(8%) over the past 6 months; this is not significant. Pt reports that her oral intake is improved in hospital; pt is documented to be eating 100% of meals. Pt has been drinking some Ensure in hospital. RD discussed with pt the importance of adequate nutrition needed to preserve lean muscle. Pt is agreeable to continue supplements. Pt is at refeed risk. Plan is for bone marrow biopsy tomorrow.    Medications  reviewed and include: vitamin C, azithromycin, ferrous sulfate, lasix, synthroid, Mg oxide, solu medrol, MVI, lovaza, protonix, ceftriaxone   Labs reviewed: Na 133(L), K 4.2 wnl, BUN 25(H), P 3.1 wnl, Mg 2.2 wnl Hgb 9.9(L), Hct 30.0(L)  NUTRITION - FOCUSED PHYSICAL EXAM:  Flowsheet Row Most Recent Value  Orbital Region No depletion  Upper Arm Region No depletion  Thoracic and Lumbar Region No depletion  Buccal Region No depletion  Temple Region Moderate depletion  Clavicle Bone Region No depletion  Clavicle and Acromion Bone Region No depletion  Scapular Bone Region No depletion  Dorsal Hand No depletion  Patellar Region Mild depletion  Anterior Thigh Region Mild depletion  Posterior Calf Region Mild depletion  Edema (RD Assessment) Mild  Hair Reviewed  Eyes Reviewed  Mouth Reviewed  Skin Reviewed  Nails Reviewed   Diet Order:   Diet Order             Diet NPO time specified  Diet effective midnight           Diet regular Room service appropriate? Yes; Fluid consistency: Thin  Diet effective now                  EDUCATION NEEDS:   Education needs have been addressed  Skin:  Skin Assessment: Reviewed RN Assessment (ecchymosis)  Last BM:  11/19- type 1  Height:   Ht Readings from Last 1 Encounters:  04/20/23 5\' 2"  (1.575 m)    Weight:   Wt Readings from Last 1 Encounters:  04/20/23 61.2  kg    Ideal Body Weight:  50 kg  BMI:  Body mass index is 24.69 kg/m.  Estimated Nutritional Needs:   Kcal:  1500-1700kcal/day  Protein:  75-85g/day  Fluid:  1.4-1.6L/day  Betsey Holiday MS, RD, LDN Please refer to [facility] for RD and/or RD on-call/weekend/after hours pager

## 2023-04-25 NOTE — TOC Progression Note (Signed)
Transition of Care [facility]) - Progression Note    Patient Details  Name: [name] MRN: [id number] Date of Birth: 12-04-1945  Transition of Care [facility]) CM/SW Contact  Chapman Fitch, RN Phone Number: 04/25/2023, 3:08 PM  Clinical Narrative:     Notified Robin at [facility] of bed offers.  She confirms that patient and family can choose bed.  Patient and son [name] in agreement to accept bed at [facility].  Accepted in HUB and notified Debra at [facility]        Expected Discharge Plan and Services                                               Social Determinants of Health (SDOH) Interventions SDOH Screenings   Food Insecurity: No Food Insecurity (04/21/2023)  Housing: Low Risk  (04/21/2023)  Transportation Needs: No Transportation Needs (04/21/2023)  Utilities: Not At Risk (04/21/2023)  Depression (PHQ2-9): Low Risk  (04/13/2023)  Tobacco Use: Low Risk  (04/21/2023)    Readmission Risk Interventions    04/24/2023    3:33 PM 04/10/2023    1:51 PM  Readmission Risk Prevention Plan  Transportation Screening Complete Complete  PCP or Specialist Appt within 3-5 Days  Complete  Palliative Care Screening  Complete  Medication Review (RN Care Manager) Complete Complete  Skilled Nursing Facility Complete

## 2023-04-25 NOTE — Progress Notes (Signed)
Occupational Therapy Treatment Patient Details Name: [name] MRN: [id number] DOB: 12/02/1945 Today's Date: 04/25/2023   History of present illness Patient is a 77 year old female with recurrent right pleural effusion. 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   OT comments  Chart reviewed prior to tx session. Pt seen for OT treatment on this date. Upon arrival to room pt awake in bed. Tx session targeted improved ADL activity tolerance, functional mobility and energy conservation. Pt was alert and oriented x4 throughout session. Pt requires MAXA for donning of TLSO pre OOB mobility. Pt utilized a sock aid to donning socks with supervision. Pt edu on energy conservation techniques - handout provided, pt demonstrated good carry-over throughout session. Pt amb to BR with RW, with CGA. Pt left on toilet trying to have BM, all needs in reach, RN notified on pt statue. Spo2 monitored throughout tx, upper 90s pre and post mobility. Pt making good progress toward goals, will continue to follow POC. Discharge recommendation remains appropriate. OT will follow acutely.           If plan is discharge home, recommend the following:  A little help with walking and/or transfers;A little help with bathing/dressing/bathroom;Assistance with cooking/housework;Assist for transportation;Help with stairs or ramp for entrance   Equipment Recommendations       Recommendations for Other Services      Precautions / Restrictions Precautions Precautions: Fall Precaution Comments: history of lumbar fracture and multi level stenosis. TLSO Restrictions Weight Bearing Restrictions: No       Mobility Bed Mobility Overal bed mobility: Needs Assistance Bed Mobility: Supine to  Sit     Supine to sit: Supervision     General bed mobility comments: TLSO donned on EOB prior to OOB mobility    Transfers Overall transfer level: Needs assistance Equipment used: Rolling walker (2 wheels) Transfers: Sit to/from Stand Sit to Stand: Contact guard assist                 Balance Overall balance assessment: Needs assistance   Sitting balance-Leahy Scale: Good     Standing balance support: Bilateral upper extremity supported Standing balance-Leahy Scale: Fair                             ADL either performed or assessed with clinical judgement   ADL Overall ADL's : Needs assistance/impaired         Upper Body Bathing: Maximal assistance;Sitting Upper Body Bathing Details (indicate cue type and reason): TLSO donning         Lower Body Dressing: Supervision/safety Lower Body Dressing Details (indicate cue type and reason): Sitting on EOB, with use of sock aid Toilet Transfer: Ambulation;Rolling walker (2 wheels);Contact guard assist     Toileting - Clothing Manipulation Details (indicate cue type and reason): Pt stated needed extra time for BM     Functional mobility during ADLs: Rolling walker (2 wheels)      Extremity/Trunk Assessment              Vision       Perception     Praxis      Cognition Arousal: Alert Behavior During Therapy: WFL for tasks assessed/performed Overall Cognitive Status: Within Functional Limits for tasks assessed  General Comments: Alert and oriented x4        Exercises Other Exercises Other Exercises: Edu re: energy conservation - handout left with pt, practice with sock aid    Shoulder Instructions       General Comments      Pertinent Vitals/ Pain       Pain Assessment Pain Assessment: Faces Faces Pain Scale: Hurts little more Pain Location: back and tailbone with bed mobility Pain Descriptors / Indicators: Discomfort,  Grimacing Pain Intervention(s): Limited activity within patient's tolerance, Monitored during session  Home Living                                          Prior Functioning/Environment              Frequency  Min 2X/week        Progress Toward Goals  OT Goals(current goals can now be found in the care plan section)  Progress towards OT goals: Progressing toward goals  Acute Rehab OT Goals Patient Stated Goal: to go home safely OT Goal Formulation: With patient Time For Goal Achievement: 04/29/23 Potential to Achieve Goals: Good  Plan      Co-evaluation                 AM-PAC OT "6 Clicks" Daily Activity     Outcome Measure   Help from another person eating meals?: None Help from another person taking care of personal grooming?: None Help from another person toileting, which includes using toliet, bedpan, or urinal?: A Little Help from another person bathing (including washing, rinsing, drying)?: A Little Help from another person to put on and taking off regular upper body clothing?: None Help from another person to put on and taking off regular lower body clothing?: A Little 6 Click Score: 21    End of Session Equipment Utilized During Treatment: Gait belt;Rolling walker (2 wheels)  OT Visit Diagnosis: Other abnormalities of gait and mobility (R26.89);Muscle weakness (generalized) (M62.81);Unsteadiness on feet (R26.81);Repeated falls (R29.6);History of falling (Z91.81);Pain   Activity Tolerance Patient tolerated treatment well   Patient Left  (On toilet with alarm in reach, RN notified)   Nurse Communication Mobility status;Other (comment) (Pt status)        Time: 1610-9604 OT Time Calculation (min): 28 min  Charges: OT General Charges $OT Visit: 1 Visit OT Treatments $Self Care/Home Management : 8-22 mins $Therapeutic Activity: 8-22 mins  [employer], OTS

## 2023-04-25 NOTE — Progress Notes (Signed)
Mobility Specialist - Progress Note  During mobility: HR 91, SpO2 94% Post-mobility: HR 86, SPO2 94%   04/25/23 1121  Mobility  Activity Ambulated with assistance in hallway;Ambulated with assistance to bathroom  Level of Assistance Contact guard assist, steadying assist  Assistive Device Front wheel walker  Distance Ambulated (ft) 320 ft  Activity Response Tolerated well  $Mobility charge 1 Mobility  Mobility Specialist Start Time (ACUTE ONLY) 1040  Mobility Specialist Stop Time (ACUTE ONLY) 1102  Mobility Specialist Time Calculation (min) (ACUTE ONLY) 22 min   Pt sitting EOB upon entry, utilizing 2L [place]. MS placed TSLO MaxA, Pt STS to RW and amb to/from the bathroom and two laps around the NS CGA-MinG. Pt expressed back pain during amb in the room, however expressing wanting to "push through" and amb this date. During amb Pt took three standing rest break due to feeling SOB, O2> 90%-- educated on the importance of energy conservation. Pt returned to the room, left supine with alarm set and needs within reach. RN notified.   Zetta Bills Mobility Specialist 04/25/23 11:30 AM

## 2023-04-25 NOTE — Telephone Encounter (Signed)
Left message for Kia to call the office.

## 2023-04-25 NOTE — [place] FL2 (Signed)
[place] MEDICAID FL2 LEVEL OF CARE FORM     IDENTIFICATION  Patient Name: Kairy Bolam Birthdate: December 29, 1945 Sex: female Admission Date (Current Location): 04/21/2023  [facility] [place] Number:      Facility and Address:         Provider Number: [id number]  Attending Physician Name and Address:  Charise Killian, MD  Relative Name and Phone Number:       Current Level of Care: Hospital Recommended Level of Care: Skilled Nursing Facility Prior Approval Number:    Date Approved/Denied:   PASRR Number: [id number]  Discharge Plan: SNF    Current Diagnoses: Patient Active Problem List   Diagnosis Date Noted   Shortness of breath 04/24/2023   Community acquired pneumonia 04/24/2023   Recurrent right pleural effusion 04/21/2023   Acute on chronic respiratory failure with hypoxia (HCC) 04/21/2023   GERD without esophagitis 04/21/2023   Paroxysmal atrial fibrillation (HCC) 04/21/2023   Pneumonia 04/21/2023   Anxiety 04/17/2023   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   Sacral fracture, closed (HCC) 04/15/2023   Fall 04/14/2023   Lumbar compression fracture (HCC) 04/14/2023   Unintentional weight loss 04/13/2023   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   Mood disorder (HCC) 04/13/2023   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) 04/12/2023   Pleural effusion on right 04/07/2023   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   Chronic kidney disease, stage 3a (HCC) 04/07/2023   Atrial fibrillation, chronic (HCC) 04/07/2023   DVT (deep venous thrombosis) (HCC) 04/07/2023   Leukocytosis 04/07/2023   Right foot pain 04/07/2023   Acute sinusitis 05/23/2022   COPD exacerbation (HCC) 05/22/2022   Atrial fibrillation with RVR (HCC) 05/22/2022   Chest pain 05/22/2022   HTN (hypertension) 05/22/2022   Diabetes mellitus without complication  (HCC) 05/22/2022   COPD (chronic obstructive pulmonary disease) (HCC) 05/22/2022   Hypothyroidism 05/22/2022   Iron deficiency anemia 05/22/2022   Osteoarthritis of right knee 03/14/2022    Orientation RESPIRATION BLADDER Height & Weight     Self, Time, Situation, Place  O2 (2L [place]) Continent Weight: 61.2 kg Height:  5\' 2"  (157.5 cm)  BEHAVIORAL SYMPTOMS/MOOD NEUROLOGICAL BOWEL NUTRITION STATUS      Continent Diet (regular)  AMBULATORY STATUS COMMUNICATION OF NEEDS Skin   Limited Assist Verbally Bruising                       Personal Care Assistance Level of Assistance              Functional Limitations Info  Hearing   Hearing Info: Impaired      SPECIAL CARE FACTORS FREQUENCY  PT (By licensed PT), OT (By licensed OT)                    Contractures Contractures Info: Not present    Additional Factors Info  Code Status, Allergies Code Status Info: full Allergies Info: Cephalosporins, Codeine, Contrast Media (Iodinated Contrast Media), Oxycodone           Current Medications (04/25/2023):  This is the current hospital active medication list Current Facility-Administered Medications  Medication Dose Route Frequency Provider Last Rate Last Admin   acetaminophen (TYLENOL) tablet 650 mg  650 mg Oral Q6H PRN Mansy, Jan A, MD   650 mg at 04/24/23 2001   Or   acetaminophen (TYLENOL) suppository 650 mg  650 mg Rectal  Q6H PRN Mansy, Jan A, MD       albuterol (PROVENTIL) (2.5 MG/3ML) 0.083% nebulizer solution 2.5 mg  2.5 mg Nebulization Q4H PRN Charise Killian, MD       ALPRAZolam Prudy Feeler) tablet 0.25 mg  0.25 mg Oral BID Mansy, Jan A, MD   0.25 mg at 04/25/23 8295   amiodarone (PACERONE) tablet 100 mg  100 mg Oral Daily Mansy, Jan A, MD   100 mg at 04/25/23 6213   apixaban (ELIQUIS) tablet 2.5 mg  2.5 mg Oral BID Mansy, Jan A, MD   2.5 mg at 04/25/23 0865   ascorbic acid (VITAMIN C) tablet 1,500 mg  1,500 mg Oral Daily Mansy, Jan A, MD   1,500 mg at  04/25/23 7846   azithromycin (ZITHROMAX) tablet 500 mg  500 mg Oral Daily Charise Killian, MD       bismuth subsalicylate (PEPTO BISMOL) 262 MG/15ML suspension 30 mL  30 mL Oral Q6H PRN Mansy, Jan A, MD       cefTRIAXone (ROCEPHIN) 2 g in sodium chloride 0.9 % 100 mL IVPB  2 g Intravenous Q24H Mansy, Jan A, MD 200 mL/hr at 04/24/23 2114 2 g at 04/24/23 2114   diclofenac Sodium (VOLTAREN) 1 % topical gel 2 g  2 g Topical QID PRN Mansy, Jan A, MD       feeding supplement (ENSURE ENLIVE / ENSURE PLUS) liquid 237 mL  237 mL Oral TID BM Charise Killian, MD   237 mL at 04/25/23 0929   fentaNYL (DURAGESIC) 12 MCG/HR 1 patch  1 patch Transdermal Q72H Charise Killian, MD   1 patch at 04/23/23 1203   ferrous sulfate tablet 325 mg  325 mg Oral Daily Mansy, Jan A, MD   325 mg at 04/25/23 0929   fluticasone (FLONASE) 50 MCG/ACT nasal spray 2 spray  2 spray Each Nare Daily PRN Mansy, Jan A, MD       furosemide (LASIX) injection 40 mg  40 mg Intravenous Daily Charise Killian, MD   40 mg at 04/25/23 0929   guaiFENesin (MUCINEX) 12 hr tablet 600 mg  600 mg Oral BID PRN Mansy, Jan A, MD   600 mg at 04/24/23 2001   HYDROcodone-acetaminophen (NORCO/VICODIN) 5-325 MG per tablet 1-2 tablet  1-2 tablet Oral Q6H PRN Charise Killian, MD   2 tablet at 04/24/23 1733   ipratropium-albuterol (DUONEB) 0.5-2.5 (3) MG/3ML nebulizer solution 3 mL  3 mL Nebulization BID Charise Killian, MD       levothyroxine (SYNTHROID) tablet 125 mcg  125 mcg Oral QAC breakfast Mansy, Jan A, MD   125 mcg at 04/25/23 0606   loratadine (CLARITIN) tablet 10 mg  10 mg Oral Daily PRN Mansy, Jan A, MD       magnesium hydroxide (MILK OF MAGNESIA) suspension 30 mL  30 mL Oral Daily PRN Mansy, Jan A, MD   30 mL at 04/24/23 1407   magnesium oxide (MAG-OX) tablet 200 mg  200 mg Oral Daily Mansy, Jan A, MD   200 mg at 04/25/23 0929   methylPREDNISolone sodium succinate (SOLU-MEDROL) 40 mg/mL injection 40 mg  40 mg Intravenous Q8H  Charise Killian, MD   40 mg at 04/25/23 0606   morphine (PF) 2 MG/ML injection 2 mg  2 mg Intravenous Q4H PRN Charise Killian, MD   2 mg at 04/22/23 1739   multivitamin with minerals tablet 1 tablet  1 tablet Oral Daily Fabienne Bruns M,  MD   1 tablet at 04/25/23 0929   nitroGLYCERIN (NITROSTAT) SL tablet 0.4 mg  0.4 mg Sublingual Q5 min PRN Mansy, Jan A, MD       olopatadine (PATANOL) 0.1 % ophthalmic solution 1 drop  1 drop Both Eyes Daily PRN Mansy, Jan A, MD       omega-3 acid ethyl esters (LOVAZA) capsule 1 g  1 capsule Oral Daily Mansy, Jan A, MD   1 g at 04/25/23 0929   ondansetron (ZOFRAN) tablet 4 mg  4 mg Oral Q6H PRN Mansy, Jan A, MD       Or   ondansetron [facility]) injection 4 mg  4 mg Intravenous Q6H PRN Mansy, Jan A, MD   4 mg at 04/21/23 1419   pantoprazole (PROTONIX) EC tablet 40 mg  40 mg Oral Daily Mansy, Jan A, MD   40 mg at 04/25/23 4315   polyvinyl alcohol (LIQUIFILM TEARS) 1.4 % ophthalmic solution 2 drop  2 drop Both Eyes Daily Mansy, Jan A, MD   2 drop at 04/25/23 0929   sodium chloride (OCEAN) 0.65 % nasal spray 1 spray  1 spray Each Nare PRN Mansy, Jan A, MD       traZODone (DESYREL) tablet 50 mg  50 mg Oral QHS PRN Mansy, Jan A, MD   50 mg at 04/23/23 2259     Discharge Medications: Please see discharge summary for a list of discharge medications.  Relevant Imaging Results:  Relevant Lab Results:   Additional Information ss [id number]  Chapman Fitch, RN

## 2023-04-26 ENCOUNTER — Ambulatory Visit
Admission: RE | Admit: 2023-04-26 | Discharge: 2023-04-26 | Disposition: A | Discharge status: Home / Self Care | Payer: Medicare (Managed Care) | Source: Ambulatory Visit | Attending: Oncology | Admitting: Oncology

## 2023-04-26 ENCOUNTER — Inpatient Hospital Stay: Payer: Medicare (Managed Care) | Admitting: Radiology

## 2023-04-26 DIAGNOSIS — C8590 Non-Hodgkin lymphoma, unspecified, unspecified site: Secondary | ICD-10-CM

## 2023-04-26 DIAGNOSIS — R0602 Shortness of breath: Secondary | ICD-10-CM | POA: Diagnosis not present

## 2023-04-26 DIAGNOSIS — D72820 Lymphocytosis (symptomatic): Secondary | ICD-10-CM

## 2023-04-26 DIAGNOSIS — Z7189 Other specified counseling: Secondary | ICD-10-CM

## 2023-04-26 DIAGNOSIS — Z515 Encounter for palliative care: Secondary | ICD-10-CM

## 2023-04-26 DIAGNOSIS — J90 Pleural effusion, not elsewhere classified: Secondary | ICD-10-CM | POA: Diagnosis not present

## 2023-04-26 HISTORY — PX: IR BONE MARROW BIOPSY & ASPIRATION: IMG5727

## 2023-04-26 LAB — CBC WITH DIFFERENTIAL/PLATELET
Abs Immature Granulocytes: 0.09 10*3/uL — ABNORMAL HIGH (ref 0.00–0.07)
Basophils Absolute: 0 10*3/uL (ref 0.0–0.1)
Basophils Relative: 0 %
Eosinophils Absolute: 0 10*3/uL (ref 0.0–0.5)
Eosinophils Relative: 0 %
HCT: 30.4 % — ABNORMAL LOW (ref 36.0–46.0)
Hemoglobin: 10.1 g/dL — ABNORMAL LOW (ref 12.0–15.0)
Immature Granulocytes: 1 %
Lymphocytes Relative: 7 %
Lymphs Abs: 0.9 10*3/uL (ref 0.7–4.0)
MCH: 30.4 pg (ref 26.0–34.0)
MCHC: 33.2 g/dL (ref 30.0–36.0)
MCV: 91.6 fL (ref 80.0–100.0)
Monocytes Absolute: 0.6 10*3/uL (ref 0.1–1.0)
Monocytes Relative: 5 %
Neutro Abs: 11.5 10*3/uL — ABNORMAL HIGH (ref 1.7–7.7)
Neutrophils Relative %: 87 %
Platelets: 241 10*3/uL (ref 150–400)
RBC: 3.32 MIL/uL — ABNORMAL LOW (ref 3.87–5.11)
RDW: 13.5 % (ref 11.5–15.5)
WBC: 13.1 10*3/uL — ABNORMAL HIGH (ref 4.0–10.5)
nRBC: 0 % (ref 0.0–0.2)

## 2023-04-26 LAB — BASIC METABOLIC PANEL
Anion gap: 9 (ref 5–15)
BUN: 37 mg/dL — ABNORMAL HIGH (ref 8–23)
CO2: 31 mmol/L (ref 22–32)
Calcium: 8.8 mg/dL — ABNORMAL LOW (ref 8.9–10.3)
Chloride: 97 mmol/L — ABNORMAL LOW (ref 98–111)
Creatinine, Ser: 0.76 mg/dL (ref 0.44–1.00)
GFR, Estimated: >60 mL/min (ref >60)
Glucose, Bld: 151 mg/dL — ABNORMAL HIGH (ref 70–99)
Potassium: 4.9 mmol/L (ref 3.5–5.1)
Sodium: 137 mmol/L (ref 135–145)

## 2023-04-26 LAB — CULTURE, BLOOD (ROUTINE X 2)
Culture: NO GROWTH
Culture: NO GROWTH
Special Requests: ADEQUATE
Special Requests: ADEQUATE
Specimen Description: REACTIVE

## 2023-04-26 LAB — PHOSPHORUS: Phosphorus: 3.5 mg/dL (ref 2.5–4.6)

## 2023-04-26 LAB — GLUCOSE, CAPILLARY
Glucose-Capillary: 146 mg/dL — ABNORMAL HIGH (ref 70–99)
Glucose-Capillary: 175 mg/dL — ABNORMAL HIGH (ref 70–99)

## 2023-04-26 LAB — MAGNESIUM: Magnesium: 2.5 mg/dL — ABNORMAL HIGH (ref 1.7–2.4)

## 2023-04-26 MED ORDER — FENTANYL CITRATE (PF) 100 MCG/2ML IJ SOLN
INTRAMUSCULAR | Status: AC
  Filled 2023-04-26: qty 2

## 2023-04-26 MED ORDER — HYDROCOD POLI-CHLORPHE POLI ER 10-8 MG/5ML PO SUER: 5.0000 mL | Freq: Two times a day (BID) | ORAL | Status: DC | PRN

## 2023-04-26 MED ORDER — MIDAZOLAM HCL 2 MG/2ML IJ SOLN
INTRAMUSCULAR | Status: AC | PRN
  Administered 2023-04-26: .5 mg via INTRAVENOUS
  Administered 2023-04-26: 1 mg via INTRAVENOUS

## 2023-04-26 MED ORDER — FENTANYL CITRATE (PF) 100 MCG/2ML IJ SOLN
INTRAMUSCULAR | Status: AC | PRN
  Administered 2023-04-26 (×2): 25 ug via INTRAVENOUS

## 2023-04-26 MED ORDER — MIDAZOLAM HCL 2 MG/2ML IJ SOLN
INTRAMUSCULAR | Status: AC
  Filled 2023-04-26: qty 4

## 2023-04-26 MED ORDER — HYDROCOD POLI-CHLORPHE POLI ER 10-8 MG/5ML PO SUER: 5.0000 mL | Freq: Every evening | ORAL | Status: DC | PRN

## 2023-04-26 MED ORDER — HYDROCOD POLI-CHLORPHE POLI ER 10-8 MG/5ML PO SUER
5.0000 mL | Freq: Two times a day (BID) | ORAL | Status: DC | PRN
Start: 2023-04-26 — End: 2023-04-28
  Administered 2023-04-27 – 2023-04-28 (×2): 5 mL via ORAL
  Filled 2023-04-26 (×2): qty 5

## 2023-04-26 MED ORDER — ESCITALOPRAM OXALATE 10 MG PO TABS
10.0000 mg | ORAL_TABLET | Freq: Every day | ORAL | Status: DC
Start: 2023-04-26 — End: 2023-04-28
  Administered 2023-04-26 – 2023-04-28 (×3): 10 mg via ORAL
  Filled 2023-04-26 (×3): qty 1

## 2023-04-26 MED ORDER — LIDOCAINE 1 % OPTIME INJ - NO CHARGE
10.0000 mL | Freq: Once | INTRAMUSCULAR | Status: AC
  Administered 2023-04-26: 10 mL via INTRADERMAL

## 2023-04-26 MED ORDER — HEPARIN SOD (PORK) LOCK FLUSH 100 UNIT/ML IV SOLN
INTRAVENOUS | Status: AC
  Filled 2023-04-26: qty 5

## 2023-04-26 MED ORDER — PREDNISONE 20 MG PO TABS
40.0000 mg | ORAL_TABLET | Freq: Every day | ORAL | Status: DC
  Administered 2023-04-27 – 2023-04-28 (×2): 40 mg via ORAL
  Filled 2023-04-26 (×2): qty 2

## 2023-04-26 NOTE — Progress Notes (Signed)
PT Cancellation Note  Patient Details Name: [name] MRN: [id number] DOB: 10-Jan-1946   Cancelled Treatment:     PT attempt. Pt off floor for biopsy. Will return at a later time/date and continue to follow per current POC.    Rushie Chestnut 04/26/2023, 10:10 AM

## 2023-04-26 NOTE — Progress Notes (Signed)
  PROGRESS NOTE    [name]  FUX:[id number] DOB: [birth year] DOA: 04/21/2023 PCP: [facility]  209A/209A-AA  LOS: 5 days   Brief hospital course:   Assessment & Plan: Libbey Sticht is a 77 y.o. female with medical history significant for asthma, COPD, type diabetes mellitus, GERD, hypertension chronic hypoxic respiratory failure on home O2 at baseline of 2 L/min, and hypothyroidism, who presented to the emergency room with acute onset of worsening dyspnea with associated dry cough with right-sided chest pain with deep breathing.    Recurrent right pleural effusion: likely secondary to concern for B cell lymphoma. --s/p right thoracentesis with 400 ml removed. --Bone marrow biopsy today.  --IR for pleural cath placement, per onc rec   Pneumonia:  --completed 6 days of ceftriaxone, 7 days of azithro  Acute COPD exacerbation Chronic hypoxemic respiratory failure on 2L O2 --dyspnea, wheezing, coughing --started on IV solumedrol --transition to prednisone 40 mg daily Tussionex prn for cough.   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  Onco following and recs apprec  --bone marrow biopsy today --plan for outpatient f/u with thoracic surgery for VATS and pleural biopsy    Hx of cirrhosis: as per pt's family.  Continue on lasix    Thrombocytopenia:  likely secondary to cirrhosis.    Anxiety:  --Xanax PRN   CKDIIIa ruled out CKD 2   Hypothyroidism: continue on home dose of levothyroxine    PAF: continue on amio, eliquis    GERD: continue on PPI    DVT prophylaxis: [id number] Code Status: Full code  Family Communication: son updated at bedside today Level of care: Telemetry Medical Dispo:   The patient is from: home Anticipated d/c is to: SNF rehab Anticipated d/c date is: 1-2 days   Subjective and Interval History:   Pt reported dyspnea, trouble with sleep.  Pt was very anxious and teary.   Objective: Vitals:   04/26/23 0945 04/26/23 1000 04/26/23 1024 04/26/23 1600  BP: (!) 118/55 110/60 122/65 124/84  Pulse: 81 80 89 84  Resp: 14 14 14 20   Temp:   97.7 F (36.5 C) 98 F (36.7 C)  TempSrc:   Oral Oral  SpO2: 92% 92%  97%  Weight:      Height:        Intake/Output Summary (Last 24 hours) at 04/26/2023 1900 Last data filed at 04/26/2023 3762 Gross per 24 hour  Intake 200 ml  Output --  Net 200 ml   Filed Weights   04/20/23 1949 04/26/23 0500 04/26/23 0805  Weight: 61.2 kg 61.4 kg 61.4 kg    Examination:   Constitutional: NAD, AAOx3 HEENT: conjunctivae and lids normal, EOMI CV: No cyanosis.   RESP: normal respiratory effort, mild diffuse expiratory wheezing Neuro: II - XII grossly intact.   Psych: anxious mood and affect.     Data Reviewed: I have personally reviewed labs and imaging studies  Time spent: 50 minutes  Darlin Priestly, MD [facility] If 7PM-7AM, please contact night-coverage 04/26/2023, 7:00 PM

## 2023-04-26 NOTE — Progress Notes (Signed)
  Chaplain On-Call responded to Spiritual Care Consult Order from Laurette Schimke, NP.  The request was to provide Advance Directives information to the patient.  At 1550 hours, the patient was sleeping soundly.  Chaplain will refer the request to Overnight Chaplain Dorothy Puffer.  Chaplain Evelena Peat M.Div., [facility]

## 2023-04-26 NOTE — Plan of Care (Signed)

## 2023-04-26 NOTE — Progress Notes (Signed)
Physical Therapy Treatment Patient Details Name: [name] MRN: [id number] DOB: 1946/01/29 Today's Date: 04/26/2023   History of Present Illness Patient is a 77 year old female with recurrent right pleural effusion. 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    PT Comments  Pt was eager to perform OOB activity." I need to walk, it makes me feel better." Pt was able to exit R side of bed, stand to RW, and tolerate ambulation around RN station 2 x. TLSO donned prior to gait at EOB. Overall pt remains far from her baseline and will benefit from continued skilled PT to maximize her independence while decreasing caregiver burden.      If plan is discharge home, recommend the following: A little help with walking and/or transfers;A little help with bathing/dressing/bathroom;Assist for transportation;Help with stairs or ramp for entrance;Assistance with cooking/housework     Equipment Recommendations  None recommended by PT       Precautions / Restrictions Precautions Precautions: Fall Precaution Comments: history of lumbar fracture and multi level stenosis. TLSO Restrictions Weight Bearing Restrictions: No     Mobility  Bed Mobility Overal bed mobility: Needs Assistance Bed Mobility: Sit to Sidelying, Sidelying to Sit, Supine to Sit Sidelying to sit: Supervision Supine to sit: Supervision   Transfers Overall transfer level: Needs assistance Equipment used: Rolling walker (2 wheels) Transfers: Sit to/from Stand Sit to Stand: Contact guard assist  General transfer comment: TLSO applied in sitting at EOB    Ambulation/Gait Ambulation/Gait assistance: Contact guard assist Gait Distance (Feet): 400 Feet Assistive device: Rolling walker (2  wheels) Gait Pattern/deviations: Step-through pattern, Decreased stride length Gait velocity: decreased  General Gait Details: x 2 laps today on 4 L o2. no LOB . minimal SOB but sao2 remains > 88%    Balance Overall balance assessment: Needs assistance Sitting-balance support: Feet supported, No upper extremity supported Sitting balance-Leahy Scale: Good     Standing balance support: Bilateral upper extremity supported Standing balance-Leahy Scale: Fair       Cognition Arousal: Alert Behavior During Therapy: WFL for tasks assessed/performed Overall Cognitive Status: Within Functional Limits for tasks assessed        General Comments: Pt is A and agreeable. remains highly motivated for OOB activity               Pertinent Vitals/Pain Pain Assessment Pain Assessment: 0-10 Pain Score: 3  Pain Location: back and tailbone with bed mobility Pain Descriptors / Indicators: Discomfort, Grimacing Pain Intervention(s): Limited activity within patient's tolerance, Monitored during session, Premedicated before session, Repositioned     PT Goals (current goals can now be found in the care plan section) Acute Rehab PT Goals Patient Stated Goal: To return home safely Progress towards PT goals: Progressing toward goals    Frequency    Min 1X/week       AM-PAC PT "6 Clicks" Mobility   Outcome Measure  Help needed turning from your back to your side while in a flat bed without using bedrails?: A Little Help needed moving from lying on your back to sitting on the side of a flat bed without using bedrails?: A Little Help needed moving to and from a bed to a chair (including a wheelchair)?: A Little Help needed standing up from a chair using your arms (  e.g., wheelchair or bedside chair)?: A Little Help needed to walk in hospital room?: A Lot Help needed climbing 3-5 steps with a railing? : A Lot 6 Click Score: 16    End of Session Equipment Utilized During Treatment: Gait  belt;Back brace Activity Tolerance: Patient limited by fatigue;Patient tolerated treatment well Patient left: Other (comment) (pt wa sin BR with RN staff aware) Nurse Communication: Mobility status PT Visit Diagnosis: Muscle weakness (generalized) (M62.81);Unsteadiness on feet (R26.81)     Time: 1701-1720 PT Time Calculation (min) (ACUTE ONLY): 19 min  Charges:    $Gait Training: 8-22 mins PT General Charges $$ ACUTE PT VISIT: 1 Visit                     Jetta Lout PTA 04/26/23, 5:34 PM

## 2023-04-26 NOTE — Care Management Important Message (Signed)
Important Message  Patient Details  Name: [name] MRN: [id number] Date of Birth: 12-13-1945   Important Message Given:  Yes - Medicare IM     [name] [name] 04/26/2023, 2:41 PM

## 2023-04-26 NOTE — Consult Note (Signed)
Palliative Medicine [facility] at [facility] [phone] [phone]   Name: [name] Date: 04/26/2023 MRN: [id number]  DOB: 13-Apr-1946  Patient Care Team: [facility] as PCP - Buren Kos, MD as Consulting Physician (Oncology)    REASON FOR CONSULTATION: [name] is a 77 y.o. female with multiple medical problems including COPD on O2, diabetes, CKD stage III AA, A-fib on Eliquis, history of DVT, cirrhosis, who has multiple recent hospitalizations with workup suggestive of lymphoma and recurrent right-sided pleural effusion.  Palliative care was consulted to address goals.  SOCIAL HISTORY:     reports that she has never smoked. She has never used smokeless tobacco. She reports that she does not drink alcohol and does not use drugs.  Patient is twice widowed.  She lives at home alone.  She has a son who lives nearby.  Patient has a son in [place] and another son in [place].  Patient has a daughter in [place].  Patient also has 2 children who are deceased.  Patient worked as a Futures trader.  ADVANCE DIRECTIVES:  Does not have  CODE STATUS: Full code  PAST MEDICAL HISTORY: Past Medical History:  Diagnosis Date   Anemia    Asthma    COPD (chronic obstructive pulmonary disease) (HCC)    Diabetes mellitus without complication (HCC)    type 2   DVT (deep venous thrombosis) (HCC) 1980   GERD (gastroesophageal reflux disease)    History of hiatal hernia    Hypertension    Hypothyroidism     PAST SURGICAL HISTORY:  Past Surgical History:  Procedure Laterality Date   CATARACT EXTRACTION Bilateral    CHOLECYSTECTOMY     COLONOSCOPY     ESOPHAGOGASTRODUODENOSCOPY (EGD) WITH PROPOFOL N/A 01/17/2022   Procedure: ESOPHAGOGASTRODUODENOSCOPY (EGD) WITH PROPOFOL;  Surgeon: Jaynie Collins, DO;  Location: [facility] END[facility];  Service: Gastroenterology;  Laterality: N/A;   HUMERUS SURGERY Left    fracture  (7 surgeries total)   IR BONE MARROW BIOPSY & ASPIRATION  04/26/2023   MENISCUS REPAIR Right 2019   THYROIDECTOMY     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; reconstruction from hip bone   TONSILECTOMY, ADENOIDECTOMY, BILATERAL MYRINGOTOMY AND TUBES     TOTAL KNEE ARTHROPLASTY Right 03/14/2022   Procedure: TOTAL KNEE ARTHROPLASTY;  Surgeon: Reinaldo Berber, MD;  Location: [facility] ORS;  Service: Orthopedics;  Laterality: Right;   TOTAL SHOULDER REPLACEMENT Left    TUBAL LIGATION     WRIST SURGERY Left    multiple fractures from falls (14 surgeries from wrist to shoulder)    HEMATOLOGY/ONCOLOGY HISTORY:  Oncology History   No history exists.    ALLERGIES:  is allergic to cephalosporins, codeine, contrast media [iodinated contrast media], and oxycodone.  MEDICATIONS:  Current Facility-Administered Medications  Medication Dose Route Frequency Provider Last Rate Last Admin   acetaminophen (TYLENOL) tablet 650 mg  650 mg Oral Q6H PRN Mansy, Jan A, MD   650 mg at 04/25/23 2209   Or   acetaminophen (TYLENOL) suppository 650 mg  650 mg Rectal Q6H PRN Mansy, Jan A, MD       albuterol (PROVENTIL) (2.5 MG/3ML) 0.083% nebulizer solution 2.5 mg  2.5 mg Nebulization Q4H PRN Charise Killian, MD       ALPRAZolam Prudy Feeler) tablet 0.25 mg  0.25 mg Oral BID Mansy, Jan A, MD   0.25 mg at 04/26/23 1031   ALPRAZolam (XANAX) tablet 0.25 mg  0.25 mg  Oral BID PRN Charise Killian, MD   0.25 mg at 04/26/23 1610   amiodarone (PACERONE) tablet 100 mg  100 mg Oral Daily Mansy, Jan A, MD   100 mg at 04/26/23 1032   apixaban (ELIQUIS) tablet 2.5 mg  2.5 mg Oral BID Mansy, Jan A, MD   2.5 mg at 04/26/23 1031   ascorbic acid (VITAMIN C) tablet 1,500 mg  1,500 mg Oral Daily Mansy, Jan A, MD   1,500 mg at 04/26/23 1031   bismuth subsalicylate (PEPTO BISMOL) 262 MG/15ML suspension 30 mL  30 mL Oral Q6H PRN Mansy, Jan A, MD       diclofenac Sodium (VOLTAREN) 1 % topical gel 2 g  2 g Topical QID PRN Mansy, Jan A, MD        feeding supplement (ENSURE ENLIVE / ENSURE PLUS) liquid 237 mL  237 mL Oral TID BM Charise Killian, MD   237 mL at 04/26/23 1029   fentaNYL (DURAGESIC) 12 MCG/HR 1 patch  1 patch Transdermal Q72H Charise Killian, MD   1 patch at 04/26/23 1042   ferrous sulfate tablet 325 mg  325 mg Oral Daily Mansy, Jan A, MD   325 mg at 04/26/23 1032   fluticasone (FLONASE) 50 MCG/ACT nasal spray 2 spray  2 spray Each Nare Daily PRN Mansy, Jan A, MD       furosemide (LASIX) injection 40 mg  40 mg Intravenous Daily Charise Killian, MD   40 mg at 04/26/23 1034   guaiFENesin (MUCINEX) 12 hr tablet 600 mg  600 mg Oral BID Charise Killian, MD   600 mg at 04/26/23 1031   HYDROcodone-acetaminophen (NORCO/VICODIN) 5-325 MG per tablet 1-2 tablet  1-2 tablet Oral Q6H PRN Charise Killian, MD   2 tablet at 04/26/23 1031   ipratropium-albuterol (DUONEB) 0.5-2.5 (3) MG/3ML nebulizer solution 3 mL  3 mL Nebulization BID Charise Killian, MD   3 mL at 04/26/23 0724   levothyroxine (SYNTHROID) tablet 125 mcg  125 mcg Oral QAC breakfast Mansy, Jan A, MD   125 mcg at 04/25/23 0606   loratadine (CLARITIN) tablet 10 mg  10 mg Oral Daily PRN Mansy, Jan A, MD       magnesium hydroxide (MILK OF MAGNESIA) suspension 30 mL  30 mL Oral Daily PRN Mansy, Jan A, MD   30 mL at 04/25/23 1215   magnesium oxide (MAG-OX) tablet 200 mg  200 mg Oral Daily Mansy, Jan A, MD   200 mg at 04/26/23 1031   methylPREDNISolone sodium succinate (SOLU-MEDROL) 40 mg/mL injection 40 mg  40 mg Intravenous Q8H Charise Killian, MD   40 mg at 04/26/23 1314   morphine (PF) 2 MG/ML injection 2 mg  2 mg Intravenous Q4H PRN Charise Killian, MD   2 mg at 04/22/23 1739   multivitamin with minerals tablet 1 tablet  1 tablet Oral Daily Charise Killian, MD   1 tablet at 04/26/23 1031   nitroGLYCERIN (NITROSTAT) SL tablet 0.4 mg  0.4 mg Sublingual Q5 min PRN Mansy, Jan A, MD       olopatadine (PATANOL) 0.1 % ophthalmic solution 1 drop  1 drop  Both Eyes Daily PRN Mansy, Jan A, MD       omega-3 acid ethyl esters (LOVAZA) capsule 1 g  1 capsule Oral Daily Mansy, Jan A, MD   1 g at 04/26/23 1031   ondansetron (ZOFRAN) tablet 4 mg  4 mg Oral Q6H PRN  Mansy, Jan A, MD       Or   ondansetron [facility]) injection 4 mg  4 mg Intravenous Q6H PRN Mansy, Jan A, MD   4 mg at 04/21/23 1419   pantoprazole (PROTONIX) EC tablet 40 mg  40 mg Oral Daily Mansy, Jan A, MD   40 mg at 04/26/23 1031   polyvinyl alcohol (LIQUIFILM TEARS) 1.4 % ophthalmic solution 2 drop  2 drop Both Eyes Daily Mansy, Jan A, MD   2 drop at 04/26/23 1034   sodium chloride (OCEAN) 0.65 % nasal spray 1 spray  1 spray Each Nare PRN Mansy, Jan A, MD       traZODone (DESYREL) tablet 50 mg  50 mg Oral QHS PRN Mansy, Jan A, MD   50 mg at 04/23/23 2259    VITAL SIGNS: BP 122/65   Pulse 89   Temp 97.7 F (36.5 C) (Oral)   Resp 14   Ht 5\' 2"  (1.575 m)   Wt 135 lb 5.8 oz (61.4 kg)   SpO2 92%   BMI 24.76 kg/m  Filed Weights   04/20/23 1949 04/26/23 0500 04/26/23 0805  Weight: 135 lb (61.2 kg) 135 lb 5.8 oz (61.4 kg) 135 lb 5.8 oz (61.4 kg)    Estimated body mass index is 24.76 kg/m as calculated from the following:   Height as of this encounter: 5\' 2"  (1.575 m).   Weight as of this encounter: 135 lb 5.8 oz (61.4 kg).  LABS: CBC:    Component Value Date/Time   WBC 13.1 (H) 04/26/2023 0437   HGB 10.1 (L) 04/26/2023 0437   HCT 30.4 (L) 04/26/2023 0437   PLT 241 04/26/2023 0437   MCV 91.6 04/26/2023 0437   NEUTROABS 11.5 (H) 04/26/2023 0437   LYMPHSABS 0.9 04/26/2023 0437   MONOABS 0.6 04/26/2023 0437   EOSABS 0.0 04/26/2023 0437   BASOSABS 0.0 04/26/2023 0437   Comprehensive Metabolic Panel:    Component Value Date/Time   NA 137 04/26/2023 0437   K 4.9 04/26/2023 0437   CL 97 (L) 04/26/2023 0437   CO2 31 04/26/2023 0437   BUN 37 (H) 04/26/2023 0437   CREATININE 0.76 04/26/2023 0437   GLUCOSE 151 (H) 04/26/2023 0437   CALCIUM 8.8 (L) 04/26/2023 0437   AST 20  04/20/2023 2002   ALT 12 04/20/2023 2002   ALKPHOS 42 04/20/2023 2002   BILITOT 0.5 04/20/2023 2002   PROT 7.1 04/20/2023 2002   ALBUMIN 3.4 (L) 04/20/2023 2002    RADIOGRAPHIC STUDIES: IR BONE MARROW BIOPSY & ASPIRATION  Result Date: 04/26/2023 INDICATION: Lymphoma EXAM: Bone marrow aspiration and core biopsy using fluoroscopic guidance MEDICATIONS: None. ANESTHESIA/SEDATION: Moderate (conscious) sedation was employed during this procedure. A total of Versed 1.5 mg and Fentanyl 50 mcg was administered intravenously. Moderate Sedation Time: 23 minutes. The patient's level of consciousness and vital signs were monitored continuously by radiology nursing throughout the procedure under my direct supervision. FLUOROSCOPY TIME:  Fluoroscopy Time: 0.8 minutes (11 mGy) COMPLICATIONS: None immediate. PROCEDURE: Informed written consent was obtained from the patient after a thorough discussion of the procedural risks, benefits and alternatives. All questions were addressed. Maximal Sterile Barrier Technique was utilized including caps, mask, sterile gowns, sterile gloves, sterile drape, hand hygiene and skin antiseptic. A timeout was performed prior to the initiation of the procedure. The patient was placed prone on the exam table. Limited fluoroscopy of the pelvis was performed for planning purposes. Skin entry site was marked, and the overlying skin was  prepped and draped in the standard sterile fashion. Local analgesia was obtained with 1% lidocaine. Using fluoroscopic guidance, an 11 gauge needle was advanced just deep to the cortex of the right posterior ilium. Subsequently, bone marrow aspiration and core biopsy were performed. Specimens were submitted to lab/pathology for handling. Hemostasis was achieved with manual pressure, and a clean dressing was placed. The patient tolerated the procedure well without immediate complication. IMPRESSION: Successful bone marrow aspiration and core biopsy of the right  posterior ilium using fluoroscopic guidance. Electronically Signed   By: Olive Bass M.D.   On: 04/26/2023 12:17   NM PET Image Initial (PI) Skull Base To Thigh  Result Date: 04/24/2023 CLINICAL DATA:  Initial treatment strategy for weight loss, monoclonal B-cell lymphocytosis, and right pleural effusion. EXAM: NUCLEAR MEDICINE PET SKULL BASE TO THIGH TECHNIQUE: 7.0 mCi F-18 FDG was injected intravenously. Full-ring PET imaging was performed from the skull base to thigh after the radiotracer. CT data was obtained and used for attenuation correction and anatomic localization. Fasting blood glucose: 119 mg/dl COMPARISON:  CT chest abdomen pelvis dated 04/14/2023 FINDINGS: Mediastinal blood pool activity: SUV max 2.6 Liver activity: SUV max NA NECK: No hypermetabolic lymph nodes in the neck. Incidental CT findings: None. CHEST: No hypermetabolic mediastinal or hilar nodes. No suspicious pulmonary nodules on the CT scan. Moderate right pleural effusion without convincing nodularity or hypermetabolism. Right middle and right lower lobe compressive atelectasis. Incidental CT findings: Atherosclerotic calcifications of the aortic arch. Mitral valve annular calcifications. Mild coronary atherosclerosis of the LAD. ABDOMEN/PELVIS: No abnormal hypermetabolic activity within the liver, pancreas, adrenal glands, or spleen. No hypermetabolic lymph nodes in the abdomen or pelvis. Nodular hepatic contour, suggesting cirrhosis. Mild diffuse hypermetabolism in the gastric antrum, max SUV 5.6, favored to be reactive or less likely reflecting gastritis. However, there is mild polypoid soft tissue prominence at the pylorus on CT (series 6/image 88), equivocal. Consider direct visualization as clinically warranted. Incidental CT findings: Status post cholecystectomy. Atherosclerotic calcifications of the abdominal aorta and branch vessels. Left extrarenal pelvis. Sigmoid diverticulosis, without evidence of diverticulitis.  SKELETON: No focal hypermetabolic activity to suggest skeletal metastasis. Incidental CT findings: Status post left shoulder arthroplasty with posttraumatic deformity involving the distal humerus. Degenerative changes of the visualized thoracolumbar spine. IMPRESSION: No findings specific for malignancy. No suspicious lymphadenopathy in this patient with B-cell lymphocytosis. 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 Possible distal gastritis. Equivocal polypoid soft tissue thickening at the pylorus, consider upper endoscopy for direct visualization as clinically warranted. Electronically Signed   By: Charline Bills M.D.   On: 04/24/2023 10:06   DG Chest Port 1 View  Result Date: 04/22/2023 CLINICAL DATA:  Shortness of breath EXAM: PORTABLE CHEST 1 VIEW COMPARISON:  04/21/2023 FINDINGS: Large right pleural effusion with right lower lobe atelectasis or infiltrate, similar to prior study. No confluent opacity on the left. Heart and mediastinal contours within normal limits. Aortic atherosclerosis. No acute bony abnormality. IMPRESSION: Large right pleural effusion with right lower lobe atelectasis or infiltrate, unchanged. Electronically Signed   By: Charlett Nose M.D.   On: 04/22/2023 19:19   DG Chest Port 1 View  Result Date: 04/21/2023 CLINICAL DATA:  Pleural effusion status post thoracentesis. EXAM: PORTABLE CHEST 1 VIEW COMPARISON:  04/20/2023. FINDINGS: Cardiac silhouette is obscured. Large right-sided pleural effusion. Left lung clear. Old healed adjacent left-sided posterior fifth and sixth rib fractures. Calcified aorta. Thoracic degenerative changes. No pneumothorax. IMPRESSION: Large right-sided pleural effusion. Electronically Signed   By: Ivin Booty  Pleasure M.D.   On: 04/21/2023 18:16   US THORACENTESIS ASP PLEURAL SPACE W/IMG GUIDE  Result Date: 04/21/2023 INDICATION: Patient with history of COPD, lymphoma and recurrent right pleural effusion. Request for diagnostic and  therapeutic right thoracentesis EXAM: ULTRASOUND GUIDED RIGHT THORACENTESIS MEDICATIONS: 8 mL 1% lidocaine COMPLICATIONS: None immediate. PROCEDURE: An ultrasound guided thoracentesis was thoroughly discussed with the patient and questions answered. The benefits, risks, alternatives and complications were also discussed. The patient understands and wishes to proceed with the procedure. Written consent was obtained. Ultrasound was performed to localize and mark an adequate pocket of fluid in the right chest. Pleural effusion noted to be mildly loculated and the largest pocket was selected for drainage. The area was then prepped and draped in the normal sterile fashion. 1% Lidocaine was used for local anesthesia. Under ultrasound guidance a 6 Fr Safe-T-Centesis catheter was introduced. Thoracentesis was performed. The catheter was removed and a dressing applied. FINDINGS: A total of approximately 400 mL of serosanguineous fluid was removed. Samples were sent to the laboratory as requested by the clinical team. IMPRESSION: Successful ultrasound guided right thoracentesis yielding 400 mL of pleural fluid. Performed by Lynnette Caffey, PA-C Electronically Signed   By: Irish Lack M.D.   On: 04/21/2023 15:52   DG Chest Port 1 View  Result Date: 04/20/2023 CLINICAL DATA:  Shortness of breath EXAM: PORTABLE CHEST 1 VIEW COMPARISON:  Radiographs 04/12/2023 and PET/CT 04/19/2023 FINDINGS: Moderate-large right pleural effusion and associated airspace opacities similar to PET/CT 04/19/2023 given differences in technique. The left lung is clear. Stable cardiomediastinal silhouette. Aortic atherosclerotic calcification. Loop recorder. Left reverse TSA. IMPRESSION: Moderate-large right pleural effusion and associated airspace opacities. Electronically Signed   By: Minerva Fester M.D.   On: 04/20/2023 22:38   CT L-SPINE NO CHARGE  Result Date: 04/14/2023 CLINICAL DATA:  77 year old female fell, found down. On blood  thinners. Head hematoma. EXAM: CT LUMBAR SPINE WITHOUT CONTRAST TECHNIQUE: Technique: Multiplanar CT images of the lumbar spine were reconstructed from contemporary CT of the Abdomen and Pelvis. RADIATION DOSE REDUCTION: This exam was performed according to the departmental dose-optimization program which includes automated exposure control, adjustment of the mA and/or kV according to patient size and/or use of iterative reconstruction technique. CONTRAST:  None COMPARISON:  CT thoracic, CT Chest, Abdomen, and Pelvis today reported separately. FINDINGS: Segmentation: Normal. Alignment: Mild dextroconvex upper and levoconvex lower lumbar scoliosis. Straightening of lordosis. No significant spondylolisthesis. Vertebrae: Diffuse osteopenia. L1, L2, and L3 vertebrae appear intact. Mild L4 superior endplate compression (sagittal image 64, it is eccentric to the right (coronal image 48). L4 vertebra otherwise intact. L5 vertebra appears intact. Superior sacrum and SI joints appear intact. But there is a subtle, minimally displaced fracture of the lower sacrum in the midline S5 (sagittal image 62). Paraspinal and other soft tissues: Abdomen and pelvis are detailed separately. Lumbar paraspinal soft tissues are within normal limits. Disc levels: Lumbar spine degeneration with multilevel vacuum disc. Moderate to severe disc space loss with disc osteophyte disease L2-L3 through L3-L4. Estimated lumbar spinal stenosis by CT as follows L2-L3: Mild to moderate, and with lateral recess stenosis greater on the left. L3-L4:  Mild-to-moderate. L4-L5:  Moderate to severe (series 2, image 82). IMPRESSION: 1. Osteopenia. Positive for a minimally displaced acute fracture of the lower sacrum at S5. And a mild L4 superior endplate compression fracture is age indeterminate. 2. Advanced lumbar spine disc and endplate degeneration contributing to multilevel spinal stenosis which is moderate to severe at L4-L5 and  mild to moderate at L2-L3  and L3-L4. 3. CT Abdomen and Pelvis today reported separately. Electronically Signed   By: Odessa Fleming M.D.   On: 04/14/2023 07:19   CT T-SPINE NO CHARGE  Result Date: 04/14/2023 CLINICAL DATA:  77 year old female fell, found down. On blood thinners. Head hematoma. EXAM: CT THORACIC SPINE WITHOUT CONTRAST TECHNIQUE: Multiplanar CT images of the thoracic spine were reconstructed from contemporary CT of the Chest. RADIATION DOSE REDUCTION: This exam was performed according to the departmental dose-optimization program which includes automated exposure control, adjustment of the mA and/or kV according to patient size and/or use of iterative reconstruction technique. CONTRAST:  None COMPARISON:  CT cervical spine, CT Chest, Abdomen, and Pelvis today reported separately. Recent chest CT 04/07/2023. FINDINGS: Limited cervical spine imaging:  Reported separately. Thoracic spine segmentation: Appears normal; hypoplastic left 12th rib. Alignment: Stable thoracic kyphosis from earlier this month. No significant scoliosis or spondylolisthesis. Vertebrae: Chronic osteopenia. Subtle loss of height or compression at T5, T6, T11 is unchanged from earlier this month. No acute thoracic vertebral fracture identified. Visible posterior ribs appear intact. Paraspinal and other soft tissues: Chest and abdomen are detailed separately. Thoracic paraspinal soft tissues remain within normal limits. Disc levels: Thoracic spine degeneration although mostly anterior flowing endplate osteophytes. Partially calcified disc and/or endplate spurring at T10-T11 with evidence of mild chronic thoracic spinal stenosis there. Superimposed facet hypertrophy. Spinal stenosis could be mild or moderate. IMPRESSION: 1. Stable CT appearance of the Thoracic Spine. Osteopenia, No acute traumatic injury identified. 2. 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 3. CT Chest, Abdomen, and Pelvis today reported separately. Electronically  Signed   By: Odessa Fleming M.D.   On: 04/14/2023 07:14   CT CHEST ABDOMEN PELVIS WO CONTRAST  Result Date: 04/14/2023 CLINICAL DATA:  77 year old female fell, found down. On blood thinners. Hematoma. EXAM: CT CHEST, ABDOMEN AND PELVIS WITHOUT CONTRAST TECHNIQUE: Multidetector CT imaging of the chest, abdomen and pelvis was performed following the standard protocol without IV contrast. RADIATION DOSE REDUCTION: This exam was performed according to the departmental dose-optimization program which includes automated exposure control, adjustment of the mA and/or kV according to patient size and/or use of iterative reconstruction technique. COMPARISON:  Thoracic and lumbar CT today reported separately. Recent chest CT 04/07/2023. FINDINGS: CT CHEST FINDINGS Cardiovascular: Calcified aortic atherosclerosis. Calcified coronary artery atherosclerosis. Stable noncontrast appearance of the heart and aorta. No cardiomegaly or pericardial effusion. Mediastinum/Nodes: Stable noncontrast mediastinum. No evidence of mass, lymphadenopathy, mediastinal hematoma. Lungs/Pleura: Layering right pleural effusion with simple fluid density remains moderate to large, slightly decreased compared to 04/07/2023. No contralateral left pleural effusion. Major airways remain patent. Compressive right lung atelectasis. Right lower lobe ventilation has mildly improved. No pneumothorax. Musculoskeletal: Left shoulder arthroplasty. Chronic posttraumatic and/or degenerative changes at the right shoulder. Chronic left rib fractures. Thoracic spine is reported separately. No sternal fracture. CT ABDOMEN PELVIS FINDINGS Hepatobiliary: Nodular liver contour suggestive of cirrhosis (series 2, image 69). No discrete liver lesion in the absence of IV contrast. Cholecystectomy. Pancreas: Negative. Spleen: Negative, no splenomegaly.  No perisplenic fluid. Adrenals/Urinary Tract: Negative noncontrast adrenal glands, kidneys, bladder. Pelvic phleboliths.  Stomach/Bowel: Postoperative changes to the right ventral abdominal wall, probably chronic cholecystectomy related. Small fat containing umbilical hernia series 2, image 83. Nearby ventral abdominal wall subcutaneous fat stranding which is probably subcutaneous injection sites (series 2, image 93). No herniated bowel. No dilated large or small bowel. Severe diverticulosis of the sigmoid colon in the pelvis. No active large bowel  inflammation. Appendicoliths on series 2, image 93, but otherwise normal appendix (coronal image 44). Decompressed terminal ileum. Decompressed stomach. No free air or free fluid. No mesenteric inflammation identified. Vascular/Lymphatic: Extensive Aortoiliac calcified atherosclerosis. Vascular patency is not evaluated in the absence of IV contrast. Normal caliber abdominal aorta. No lymphadenopathy identified. Reproductive: Retroverted, otherwise negative noncontrast appearance. Other: No pelvis free fluid. Musculoskeletal: Lumbar spine is detailed separately. Sacrum, SI joints, pelvis, and proximal femurs appear intact. No superficial soft tissue injury identified. IMPRESSION: 1. No acute traumatic injury identified in the noncontrast chest, abdomen, or pelvis. See CT thoracic and lumbar spine reported separately. 2. Moderate to large layering right pleural effusion has slightly decreased compared to 04/07/2023. Simple fluid density favors transudate (see #3). Compressive right lung atelectasis. 3. Nodular liver contour strongly suggests Cirrhosis. But no ascites, no splenomegaly. 4.  Aortic Atherosclerosis (ICD10-I70.0). Electronically Signed   By: Odessa Fleming M.D.   On: 04/14/2023 07:09   CT Cervical Spine Wo Contrast  Result Date: 04/14/2023 CLINICAL DATA:  77 year old female fell, found down. On blood thinners. Hematoma. EXAM: CT CERVICAL SPINE WITHOUT CONTRAST TECHNIQUE: Multidetector CT imaging of the cervical spine was performed without intravenous contrast. Multiplanar CT image  reconstructions were also generated. RADIATION DOSE REDUCTION: This exam was performed according to the departmental dose-optimization program which includes automated exposure control, adjustment of the mA and/or kV according to patient size and/or use of iterative reconstruction technique. COMPARISON:  Head CT today.  Cervical spine CT 03/05/2023. FINDINGS: Alignment: Straightening of cervical lordosis. Cervicothoracic junction alignment is within normal limits. Bilateral posterior element alignment is within normal limits. Skull base and vertebrae: Visualized skull base is intact. No atlanto-occipital dissociation. C1 and C2 appear intact and aligned. No acute osseous abnormality identified. Soft tissues and spinal canal: No prevertebral fluid or swelling. No visible canal hematoma. Partially retropharyngeal carotids. Calcified atherosclerosis. Mild pharynx motion artifact. Disc levels: Stable cervical spine degeneration. Multilevel disc and endplate degeneration with some ligamentous hypertrophy. 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 Upper chest: Thoracic spine and chest CT today reported separately. IMPRESSION: 1. No acute traumatic injury identified in the cervical spine. 2. 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 Electronically Signed   By: Odessa Fleming M.D.   On: 04/14/2023 06:59   CT HEAD WO CONTRAST ( )  Result Date: 04/14/2023 CLINICAL DATA:  77 year old female fell, found down. On blood thinners. Head hematoma. EXAM: CT HEAD WITHOUT CONTRAST TECHNIQUE: Contiguous axial images were obtained from the base of the skull through the vertex without intravenous contrast. RADIATION DOSE REDUCTION: This exam was performed according to the departmental dose-optimization program which includes automated exposure control, adjustment of the mA and/or kV according to patient size and/or use of iterative reconstruction technique. COMPARISON:  Head CT  03/05/2023. FINDINGS: Brain: Cerebral volume is stable, within normal limits for age. No midline shift, ventriculomegaly, mass effect, evidence of mass lesion, intracranial hemorrhage or evidence of cortically based acute infarction. Stable mild for age patchy white matter hypodensity. Otherwise normal gray-white differentiation. Vascular: Calcified atherosclerosis at the skull base. No suspicious intracranial vascular hyperdensity. Skull: Stable and intact. Sinuses/Orbits: Visualized paranasal sinuses and mastoids are stable and well aerated. Other: There is a mild right posterior convexity scalp hematoma or contusion on series 3, image 35. Underlying calvarium appears intact. No other acute orbit or scalp soft tissue injury identified. IMPRESSION: 1. Right posterior convexity scalp hematoma or contusion. No skull fracture. 2. No acute intracranial abnormality. Stable and largely negative for  age non contrast CT appearance of the brain. Electronically Signed   By: Odessa Fleming M.D.   On: 04/14/2023 06:55   DG Chest Port 1 View  Result Date: 04/12/2023 CLINICAL DATA:  Cough, shortness of breath EXAM: PORTABLE CHEST 1 VIEW COMPARISON:  04/11/2023 FINDINGS: Heart size is normal. Dense mitral annulus calcifications. Benign calcified granulomatous mediastinal lymph nodes. Implantable loop recorder. No acute airspace opacity. Benign calcified nodule specific status post left shoulder reverse arthroplasty. IMPRESSION: No acute abnormality of the lungs in AP portable projection. Electronically Signed   By: Jearld Lesch M.D.   On: 04/12/2023 09:10   DG Chest Port 1 View  Result Date: 04/11/2023 CLINICAL DATA:  Pleural effusion. EXAM: PORTABLE CHEST 1 VIEW COMPARISON:  April 10, 2023. FINDINGS: The heart size and mediastinal contours are within normal limits. Status post left shoulder arthroplasty. Old left rib fractures are noted. Increased right basilar opacity is noted concerning for worsening effusion and  associated atelectasis or infiltrate. Left lung is unremarkable. IMPRESSION: Increased right basilar opacity is noted concerning for worsening effusion and associated atelectasis or infiltrate. Electronically Signed   By: Lupita Raider M.D.   On: 04/11/2023 08:51   ECHOCARDIOGRAM COMPLETE  Result Date: 04/10/2023    ECHOCARDIOGRAM REPORT   Patient Name:   [name] Date of Exam: 04/09/2023 Medical Rec #:  [id number]      Height:       62.0 in Accession #:    [id number]     Weight:       141.5 lb Date of Birth:  [birth year]       BSA:          1.650 m Patient Age:    77 years       BP:           121/76 mmHg Patient Gender: F              HR:           58 bpm. Exam Location:  [facility] Procedure: 2D Echo, Cardiac Doppler and Color Doppler Indications:    DOE R06.0  History:        Patient has prior history of Echocardiogram examinations, most                 recent 05/24/2022. COPD, Arrythmias:Atrial Fibrillation; Risk                 Factors:Hypertension and Diabetes.  Sonographer:    Dondra Prader RVT RCS Referring Phys: [id number] Sunnie Nielsen  Sonographer Comments: Patient unable to tolerate probe pressure on abdomen; no subcostal views. IMPRESSIONS  1. Left ventricular ejection fraction, by estimation, is 55 to 60%. The left ventricle has normal function. The left ventricle has no regional wall motion abnormalities. Left ventricular diastolic parameters are consistent with Grade I diastolic dysfunction (impaired relaxation).  2. Right ventricular systolic function is normal. The right ventricular size is normal. There is normal pulmonary artery systolic pressure.  3. Left atrial size was mildly dilated.  4. The mitral valve is normal in structure. Mild mitral valve regurgitation. Mild to moderate mitral stenosis. The mean mitral valve gradient is 6.0 mmHg. Severe mitral annular calcification.  5. The aortic valve is normal in structure. Aortic valve regurgitation is trivial. No aortic stenosis is present.  6.  The inferior vena cava is normal in size with greater than 50% respiratory variability, suggesting right atrial pressure of 3 mmHg. FINDINGS  Left Ventricle: Left ventricular ejection fraction,  by estimation, is 55 to 60%. The left ventricle has normal function. The left ventricle has no regional wall motion abnormalities. The left ventricular internal cavity size was normal in size. There is  no left ventricular hypertrophy. Left ventricular diastolic parameters are consistent with Grade I diastolic dysfunction (impaired relaxation). Right Ventricle: The right ventricular size is normal. No increase in right ventricular wall thickness. Right ventricular systolic function is normal. There is normal pulmonary artery systolic pressure. The tricuspid regurgitant velocity is 2.31 m/s, and  with an assumed right atrial pressure of 3 mmHg, the estimated right ventricular systolic pressure is 24.3 mmHg. Left Atrium: Left atrial size was mildly dilated. Right Atrium: Right atrial size was normal in size. Pericardium: There is no evidence of pericardial effusion. Mitral Valve: The mitral valve is normal in structure. There is moderate thickening of the mitral valve leaflet(s). There is moderate calcification of the mitral valve leaflet(s). Severe mitral annular calcification. Mild mitral valve regurgitation. Mild  to moderate mitral valve stenosis. The mean mitral valve gradient is 6.0 mmHg. Tricuspid Valve: The tricuspid valve is normal in structure. Tricuspid valve regurgitation is mild . No evidence of tricuspid stenosis. Aortic Valve: The aortic valve is normal in structure. Aortic valve regurgitation is trivial. Aortic regurgitation PHT measures 600 msec. No aortic stenosis is present. Aortic valve mean gradient measures 7.0 mmHg. Aortic valve peak gradient measures 12.7 mmHg. Aortic valve area, by VTI measures 1.18 cm. Pulmonic Valve: The pulmonic valve was normal in structure. Pulmonic valve regurgitation is not  visualized. No evidence of pulmonic stenosis. Aorta: The aortic root is normal in size and structure. Venous: The inferior vena cava is normal in size with greater than 50% respiratory variability, suggesting right atrial pressure of 3 mmHg. IAS/Shunts: No atrial level shunt detected by color flow Doppler.  LEFT VENTRICLE PLAX 2D LVIDd:         4.30 cm   Diastology LVIDs:         3.10 cm   LV e' medial:    4.56 cm/s LV PW:         1.00 cm   LV E/e' medial:  24.6 LV IVS:        0.80 cm   LV e' lateral:   4.87 cm/s LVOT diam:     1.40 cm   LV E/e' lateral: 23.0 LV SV:         43 LV SV Index:   26 LVOT Area:     1.54 cm  RIGHT VENTRICLE RV Basal diam:  2.60 cm RV S prime:     11.30 cm/s TAPSE (M-mode): 1.7 cm LEFT ATRIUM             Index        RIGHT ATRIUM          Index LA diam:        4.40 cm 2.67 cm/m   RA Area:     7.81 cm LA Vol (A2C):   50.9 ml 30.84 ml/m  RA Volume:   13.80 ml 8.36 ml/m LA Vol (A4C):   42.4 ml 25.69 ml/m LA Biplane Vol: 49.3 ml 29.87 ml/m  AORTIC VALVE                     PULMONIC VALVE AV Area (Vmax):    1.37 cm      PV Vmax:       1.06 m/s AV Area (Vmean):   1.26 cm  PV Peak grad:  4.5 mmHg AV Area (VTI):     1.18 cm AV Vmax:           178.00 cm/s AV Vmean:          116.000 cm/s AV VTI:            0.369 m AV Peak Grad:      12.7 mmHg AV Mean Grad:      7.0 mmHg LVOT Vmax:         158.00 cm/s LVOT Vmean:        94.800 cm/s LVOT VTI:          0.282 m LVOT/AV VTI ratio: 0.76 AI PHT:            600 msec  AORTA Ao Root diam: 2.80 cm Ao Asc diam:  3.60 cm MITRAL VALVE                TRICUSPID VALVE MV Area (PHT): 2.07 cm     TR Peak grad:   21.3 mmHg MV Mean grad:  6.0 mmHg     TR Vmax:        231.00 cm/s MV Decel Time: 366 msec MR Peak grad: 11.8 mmHg     SHUNTS MR Mean grad: 6.0 mmHg      Systemic VTI:  0.28 m MR Vmax:      172.00 cm/s   Systemic Diam: 1.40 cm MR Vmean:     116.0 cm/s MV E velocity: 112.00 cm/s MV A velocity: 167.00 cm/s MV E/A ratio:  0.67 Lorine Bears MD  Electronically signed by Lorine Bears MD Signature Date/Time: 04/10/2023/5:17:03 PM    Final    DG Chest Port 1 View  Result Date: 04/10/2023 CLINICAL DATA:  142230 Pleural effusion 142230. EXAM: PORTABLE CHEST 1 VIEW COMPARISON:  Chest radiograph 04/09/2023. FINDINGS: Decreased, trace residual right pleural effusion. Increasing airspace opacity in the medial aspect of the right lung base, suspicious for aspiration or developing infection. No pneumothorax. IMPRESSION: Decreased, trace residual right pleural effusion. Increasing airspace opacity in the medial aspect of the right lung base, suspicious for aspiration or developing infection. Electronically Signed   By: Orvan Falconer M.D.   On: 04/10/2023 12:47   DG Chest Port 1 View  Result Date: 04/09/2023 CLINICAL DATA:  Post thoracentesis EXAM: PORTABLE CHEST 1 VIEW COMPARISON:  CT chest 04/07/2023, chest x-ray 05/22/2022 FINDINGS: Left-sided electronic recording device. Normal cardiac size. Mitral annular calcifications. Left lung grossly clear. Decreased right pleural effusion with small moderate residual pleural effusion. No convincing right pneumothorax. Airspace disease at the right base. Aortic atherosclerosis. Old appearing left-sided rib fractures. Left shoulder replacement IMPRESSION: Decreased right pleural effusion with small to moderate residual pleural effusion. No convincing pneumothorax. Airspace disease at the right base, atelectasis versus pneumonia. Electronically Signed   By: Jasmine Pang M.D.   On: 04/09/2023 16:27   US Abdomen Limited RUQ (LIVER/GB)  Result Date: 04/09/2023 CLINICAL DATA:  Cirrhosis EXAM: ULTRASOUND ABDOMEN LIMITED RIGHT UPPER QUADRANT COMPARISON:  None Available. FINDINGS: Gallbladder: Prior cholecystectomy. Common bile duct: Diameter: 6 mm. Liver: No focal lesion identified. Nodular liver contour. Normal parenchymal echogenicity. Portal vein is patent on color Doppler imaging with normal direction of blood flow  towards the liver. Other: Right pleural effusion. IMPRESSION: 1. Cirrhotic liver morphology. 2. Right pleural effusion. Electronically Signed   By: Allegra Lai M.D.   On: 04/09/2023 12:59   DG Foot Complete Right  Result Date: 04/08/2023 CLINICAL DATA:  Right  foot pain EXAM: RIGHT FOOT COMPLETE - 3+ VIEW COMPARISON:  None Available. FINDINGS: No fracture or dislocation is seen. Mild degenerative changes of the 1st MCP joint. Degenerative changes of the dorsal midfoot. Moderate posterior and plantar enthesophytes. IMPRESSION: No fracture or dislocation is seen. Degenerative changes, as above. Electronically Signed   By: Charline Bills M.D.   On: 04/08/2023 01:55   CT CHEST WO CONTRAST  Result Date: 04/07/2023 CLINICAL DATA:  Chest pain, shortness of breath EXAM: CT CHEST WITHOUT CONTRAST TECHNIQUE: Multidetector CT imaging of the chest was performed following the standard protocol without IV contrast. RADIATION DOSE REDUCTION: This exam was performed according to the departmental dose-optimization program which includes automated exposure control, adjustment of the mA and/or kV according to patient size and/or use of iterative reconstruction technique. COMPARISON:  CT 05/23/2022 FINDINGS: Cardiovascular: Heart size is upper limits of normal. No pericardial effusion. Thoracic aorta is nonaneurysmal. There are atherosclerotic vascular calcifications of the aorta and coronary arteries. Calcification of the mitral annulus. Central pulmonary vasculature is nondilated. Mediastinum/Nodes: No enlarged mediastinal or axillary lymph nodes. Thyroid gland, trachea, and esophagus demonstrate no significant findings. Small hiatal hernia. Lungs/Pleura: Moderate-large layering right-sided pleural effusion with complete atelectasis of the right lower lobe and near-complete atelectasis of the right middle lobe. Partial compressive atelectasis of the right upper lobe. Mild mosaic attenuation of the aerated portion of  the right upper lobe and within the left lung. No left-sided pleural effusion. No pneumothorax. Upper Abdomen: Nodular hepatic surface contour. No acute abnormality. Musculoskeletal: No chest wall mass or suspicious bone lesions identified. Left chest wall loop recorder. IMPRESSION: 1. Moderate-large layering right-sided pleural effusion with complete atelectasis of the right lower lobe and near-complete atelectasis of the right middle lobe. Partial compressive atelectasis of the right upper lobe. 2. Mild mosaic attenuation of the aerated portion of the right upper lobe and within the left lung, which may be related to small airways disease. 3. Nodular hepatic surface contour, suggestive of cirrhosis. 4. Aortic and coronary artery atherosclerosis (ICD10-I70.0). Electronically Signed   By: Duanne Guess D.O.   On: 04/07/2023 15:25    PERFORMANCE STATUS (ECOG) : 2 - Symptomatic, <50% confined to bed  Review of Systems Unless otherwise noted, a complete review of systems is negative.  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  Patient status post bone marrow biopsy.  She has recurrent pleural effusion status post thoracentesis.  Has also been treated for pneumonia/COPD exacerbation.  I met with patient today to address goals.  Patient significantly tearful as she described the recent change to her quality of life.  She was living at home independently and walking several miles daily.  Now she is short of breath with short ambulation back and forth to the bathroom.  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  There is discussion about possible transfer for VATS and pleural biopsy to help with diagnostic process.  Patient says that she is in agreement with current scope of treatment and would like to pursue any options to try to treat potential cancer and improve her quality of life.  Symptomatically, she endorses dyspnea.  She is also highly anxious appearing and quite  tearful as she discussed her current situation.  She says that she has had difficulty sleeping secondary to her anxiety but also with coughing and shortness of breath when she tries to lie flat.  Patient requests cough medication at bedtime.  She is taking twice daily alprazolam but does not feel it to be particularly helpful.  Will trial SSRI.  Could consider rotating back to lorazepam (used during her most recent hospitalization) or longer acting clonazepam if needed.  Patient says that she does not have advance directives but is interested in establishing those.  She would want her son, [name], to be her healthcare power of attorney.  Patient says that she has discussed end-of-life decision making with her family.  She would want to remain a full code for now but makes it clear that she would not want to be "a vegetable."  She would not want her life prolonged long-term on machines if it were clear that such care were likely futile.  PLAN: -Continue current scope of treatment -Start escitalopram 10 mg daily -Start Tussionex nightly as needed -Continue alprazolam as needed -Will benefit from referral to outpatient counseling [facility]) -Referral to chaplain to assist with establishing ACP documents  Case and plan discussed with Dr. Cathie Hoops  Time Total: 60 minutes  Visit consisted of counseling and education dealing with the complex and emotionally intense issues of symptom management and palliative care in the setting of serious and potentially life-threatening illness.Greater than 50%  of this time was spent counseling and  coordinating care related to the above assessment and plan.  Signed by: Laurette Schimke, PhD, NP-C

## 2023-04-26 NOTE — Consult Note (Signed)
Consultation Note Date: 04/26/2023   Patient Name: Tahjanae [name]  DOB: 1946-04-07  MRN: [id number]  Age / Sex: 77 y.o., female  PCP: [facility] Referring Physician: Darlin Priestly, MD  Reason for Consultation: Establishing goals of care  HPI/Patient Profile: [name] is a 77 y.o. female with medical history significant for asthma, COPD, type diabetes mellitus, GERD, hypertension chronic hypoxic respiratory failure on home O2 at baseline of 2 L/min, and hypothyroidism, who presented to the emergency room with acute onset of worsening dyspnea with associated dry cough with right-sided chest pain with deep breathing.  The patient has been recently evaluated for lymphoma and had a PET scan on 04/19/2023 which report is currently pending.  She is scheduled to have bone marrow biopsy on 11/20.  She is at previous thoracentesis for right-sided pleural effusion.  She admits to chills without measured fever.  No nausea or vomiting or abdominal pain.  No bleeding diathesis.  No dysuria, oliguria or hematuria or flank pain.  She stated that she has been requiring 3 L of O2 by nasal cannula and today increased to 5 L.   Clinical Assessment and Goals of Care: Notes and labs reviewed.  Into see patient.  She is currently sitting in bed watching television.  Patient states she is widowed x 2.  She states she has 4 of 6 children living.  She states 3 of them live out of state, and one of her children, [name], works for [employer] PT/OT.  She states they have discussed getting cameras installed in the home so that she will be able to care for herself but so that they can see and check on her.  She states she is fallen 3 times and each 1 has been worse.   We discussed her diagnosis, prognosis, GOC, EOL wishes disposition and options.  Created space and opportunity for patient  to explore thoughts and feelings regarding  current medical information.   A detailed discussion was had today regarding advanced directives.  Concepts specific to code status, artifical feeding and hydration, IV antibiotics and rehospitalization were discussed.  The difference between an aggressive medical intervention path and a comfort care path was discussed.  Values and goals of care important to patient and family were attempted to be elicited.  Discussed limitations of medical interventions to prolong quality of life in some situations and discussed the concept of human mortality.  She states she is speaking with oncology regarding her cancer diagnosis.    She discusses her inpatient care for pneumonia and recurrent right pleural effusion.  She discusses her cirrhosis as well.  She states she has never smoked nor drank alcohol, and used to walk a couple of miles several times a week trying to take care of herself.  She discusses having numerous previous surgeries.  She states a VATS procedure has been brought up to her.  We discussed what this would entail and recovery after this. With conversation, patient states she would want to be placed on ventilator support if  needed, but would only want this short-term.  With conversation she states she does not believe that she would want CPR.  She discusses that acceptable quality of life would mean her ability to continue to live alone, cooking and cleaning and caring for herself.  Patient is amenable to having a goals of care conversation with her son present.  She states she would want her son [name] to be her H POA.  Will reach out to spiritual care to help facilitate this.    SUMMARY OF RECOMMENDATIONS   PMT will follow   Prognosis:  Poor overall     Primary Diagnoses: Present on Admission:  Recurrent right pleural effusion  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)  Hypothyroidism  Pneumonia   I have reviewed the medical record,  interviewed the patient and family, and examined the patient. The following aspects are pertinent.  Past Medical History:  Diagnosis Date   Anemia    Asthma    COPD (chronic obstructive pulmonary disease) (HCC)    Diabetes mellitus without complication (HCC)    type 2   DVT (deep venous thrombosis) (HCC) 1980   GERD (gastroesophageal reflux disease)    History of hiatal hernia    Hypertension    Hypothyroidism    Social History   Socioeconomic History   Marital status: Widowed    Spouse name: Not on file   Number of children: 6   Years of education: Not on file   Highest education level: Not on file  Occupational History   Not on file  Tobacco Use   Smoking status: Never   Smokeless tobacco: Never  Vaping Use   Vaping status: Never Used  Substance and Sexual Activity   Alcohol use: Never   Drug use: Never   Sexual activity: Not Currently  Other Topics Concern   Not on file  Social History Narrative   Lives in senior housing in [place] (apt[place]   Social Determinants of Health   Financial Resource Strain: Not on file  Food Insecurity: No Food Insecurity (04/21/2023)   Hunger Vital Sign    Worried About Running Out of Food in the Last Year: Never true    Ran Out of Food in the Last Year: Never true  Transportation Needs: No Transportation Needs (04/21/2023)   PRAPARE - Administrator, Civil Service (Medical): No    Lack of Transportation (Non-Medical): No  Physical Activity: Not on file  Stress: Not on file  Social Connections: Not on file   Family History  Problem Relation Age of Onset   Kidney disease Mother    Tuberculosis Father    Scheduled Meds:  ALPRAZolam  0.25 mg Oral BID   amiodarone  100 mg Oral Daily   apixaban  2.5 mg Oral BID   ascorbic acid  1,500 mg Oral Daily   escitalopram  10 mg Oral Daily   feeding supplement  237 mL Oral TID BM   fentaNYL  1 patch Transdermal Q72H   ferrous sulfate  325 mg Oral Daily   furosemide  40 mg  Intravenous Daily   guaiFENesin  600 mg Oral BID   ipratropium-albuterol  3 mL Nebulization BID   levothyroxine  125 mcg Oral QAC breakfast   magnesium oxide  200 mg Oral Daily   methylPREDNISolone (SOLU-MEDROL) injection  40 mg Intravenous Q8H   multivitamin with minerals  1 tablet Oral Daily   omega-3 acid ethyl esters  1  capsule Oral Daily   pantoprazole  40 mg Oral Daily   polyvinyl alcohol  2 drop Both Eyes Daily   Continuous Infusions: PRN Meds:.acetaminophen **OR** acetaminophen, albuterol, ALPRAZolam, bismuth subsalicylate, chlorpheniramine-HYDROcodone, diclofenac Sodium, fluticasone, HYDROcodone-acetaminophen, loratadine, magnesium hydroxide, morphine injection, nitroGLYCERIN, olopatadine, ondansetron **OR** ondansetron (ZOFRAN) IV, sodium chloride, traZODone Medications Prior to Admission:  Prior to Admission medications   Medication Sig Start Date End Date Taking? Authorizing Provider  ADVAIR HFA 115-21 MCG/ACT inhaler Inhale 2 puffs into the lungs 2 (two) times daily. 04/07/23  Yes [provider]  amiodarone (PACERONE) 100 MG tablet Take 1 tablet (100 mg total) by mouth daily. 04/12/23  Yes Wieting, Richard, MD  apixaban (ELIQUIS) 2.5 MG TABS tablet Take 1 tablet (2.5 mg total) by mouth 2 (two) times daily. 04/17/23 06/16/23 Yes Leeroy Bock, MD  enalapril (VASOTEC) 20 MG tablet Take 20 mg by mouth daily.   Yes [provider]  esomeprazole (NEXIUM) 40 MG capsule Take 40 mg by mouth 2 (two) times daily. 05/06/22  Yes [provider]  furosemide (LASIX) 20 MG tablet Take 20 mg by mouth daily.   Yes [provider]  ipratropium-albuterol (DUONEB) 0.5-2.5 (3) MG/3ML SOLN Take 3 mLs by nebulization every 4 (four) hours as needed. 02/14/23  Yes [provider]  levothyroxine (SYNTHROID) 125 MCG tablet Take 125 mcg by mouth daily before breakfast.   Yes [provider]  metoprolol tartrate (LOPRESSOR) 25 MG tablet Take 12.5 mg by  mouth 2 (two) times daily.   Yes [provider]  ondansetron (ZOFRAN) 4 MG tablet Take 4 mg by mouth every 8 (eight) hours as needed for nausea or vomiting.   Yes [provider]  potassium chloride (KLOR-CON) 10 MEQ tablet Take 10 mEq by mouth 2 (two) times daily.   Yes [provider]  rOPINIRole (REQUIP) 0.25 MG tablet Take 0.25 mg by mouth at bedtime. 03/30/23  Yes [provider]  traMADol (ULTRAM) 50 MG tablet Take 50 mg by mouth every 6 (six) hours as needed for moderate pain (pain score 4-6) or severe pain (pain score 7-10).   Yes [provider]  traZODone (DESYREL) 50 MG tablet Take 50 mg by mouth at bedtime as needed for sleep. 03/14/23  Yes [provider]  acetaminophen (TYLENOL) 325 MG tablet Take 1,300 mg by mouth every 6 (six) hours as needed for moderate pain.    [provider]  albuterol (PROVENTIL) (2.5 MG/3ML) 0.083% nebulizer solution Take 2.5 mg by nebulization every 4 (four) hours as needed for wheezing or shortness of breath. Patient not taking: Reported on 04/21/2023    [provider]  albuterol (VENTOLIN HFA) 108 (90 Base) MCG/ACT inhaler Inhale 2-4 puffs by mouth every 4 hours as needed for wheezing, cough, and/or shortness of breath 11/28/20   Loleta Rose, MD  ALPRAZolam Prudy Feeler) 0.25 MG tablet Take 1 tablet (0.25 mg total) by mouth 2 (two) times daily. Patient not taking: Reported on 04/21/2023 04/17/23 05/17/23  Leeroy Bock, MD  Ascorbic Acid (VITAMIN C CR) 1500 MG TBCR Take 1 tablet by mouth daily.    [provider]  bismuth subsalicylate (PEPTO BISMOL) 262 MG/15ML suspension Take 30 mLs by mouth every 6 (six) hours as needed for indigestion or diarrhea or loose stools.    [provider]  Carboxymeth-Glycerin-Polysorb (REFRESH OPTIVE ADVANCED) 0.5-1-0.5 % SOLN Apply 2 drops to eye daily.    [provider]  diclofenac Sodium (VOLTAREN) 1 % GEL Apply 2  g  topically 4 (four) times daily as needed.    [provider]  Emollient (EUCERIN) lotion Apply 1 Application topically as needed for dry skin.    [provider]  feeding supplement (ENSURE ENLIVE / ENSURE PLUS) LIQD Take 237 mLs by mouth 2 (two) times daily between meals. 04/17/23   Leeroy Bock, MD  ferrous sulfate 325 (65 FE) MG tablet Take 325 mg by mouth daily.    [provider]  fluticasone (FLONASE) 50 MCG/ACT nasal spray Place 2 sprays into both nostrils daily as needed for allergies or rhinitis.    [provider]  guaiFENesin (MUCINEX) 600 MG 12 hr tablet Take 600 mg by mouth 2 (two) times daily as needed for to loosen phlegm or cough.    [provider]  ketoconazole (NIZORAL) 2 % shampoo Apply 1 Application topically 2 (two) times a week.    [provider]  ketotifen (ZADITOR) 0.025 % ophthalmic solution Place 1 drop into both eyes daily as needed.    [provider]  loratadine (CLARITIN) 10 MG tablet Take 1 tablet (10 mg total) by mouth daily. Patient taking differently: Take 10 mg by mouth daily as needed for allergies, rhinitis or itching. 05/25/22   Pennie Banter, DO  Magnesium 250 MG TABS Take 1 tablet by mouth daily.    [provider]  nitroGLYCERIN (NITROSTAT) 0.4 MG SL tablet Place 0.4 mg under the tongue every 5 (five) minutes as needed for chest pain.    [provider]  olopatadine (PATANOL) 0.1 % ophthalmic solution Place 1 drop into both eyes daily as needed for allergies.    [provider]  Omega-3 1000 MG CAPS Take 1 capsule by mouth daily.    [provider]  OXYGEN Inhale 2 L into the lungs at bedtime.    [provider]  sodium chloride (OCEAN) 0.65 % SOLN nasal spray Place 1 spray into both nostrils as needed for congestion. 05/24/22   Pennie Banter, DO  SYSTANE ULTRA 0.4-0.3 % SOLN Apply 2 drops to eye QID. 04/07/23   [provider]   TURMERIC PO Take 500 mg by mouth daily.    [provider]  VITAMIN D-VITAMIN K PO Take 45 mcg by mouth daily.    [provider]  Zinc Oxide 10 % OINT Apply 1 application  topically 3 (three) times daily as needed.    [provider]   Allergies  Allergen Reactions   Cephalosporins Itching   Codeine Other (See Comments)    Extreme drowsiness   Contrast Media [Iodinated Contrast Media] Hives and Swelling    Facial swelling   Oxycodone Other (See Comments)    Extreme drowsiness   Review of Systems  Musculoskeletal:        Left arm pain    Physical Exam Pulmonary:     Effort: Pulmonary effort is normal.  Neurological:     Mental Status: She is alert.     Vital Signs: BP 124/84 (BP Location: Right Arm)   Pulse 84   Temp 98 F (36.7 C) (Oral)   Resp 20   Ht 5\' 2"  (1.575 m)   Wt 61.4 kg   SpO2 97%   BMI 24.76 kg/m  Pain Scale: 0-10 POSS *See Group Information*: 1-Acceptable,Awake and alert Pain Score: 9    SpO2: SpO2: 97 % O2 Device:SpO2: 97 % O2 Flow Rate: .O2 Flow Rate (L/min): 4 L/min  IO: Intake/output summary:  Intake/Output  Summary (Last 24 hours) at 04/26/2023 1606 Last data filed at 04/26/2023 1517 Gross per 24 hour  Intake 200 ml  Output --  Net 200 ml    LBM: Last BM Date : 04/26/23 Baseline Weight: Weight: 61.2 kg Most recent weight: Weight: 61.4 kg       Signed by: Morton Stall, NP   Please contact Palliative Medicine Team phone at [phone] for questions and concerns.  For individual provider: See Loretha Stapler

## 2023-04-26 NOTE — Progress Notes (Signed)
Hematology/Oncology Progress note [phone] [phone]     Patient Care Team: [facility] as PCP - Buren Kos, MD as Consulting Physician (Oncology)   Name of the patient: [name]  [id number]  [birth year]  Date of visit: 04/26/23   INTERVAL HISTORY-   S/p bone marrow biopsy this morning.  + SOB and wheezing.  + very worried and did not sleep well last night.     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    Priority: Medium    Mood disorder (HCC) 04/13/2023    Priority: Medium    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) 04/12/2023    Priority: Medium    Shortness of breath 04/24/2023   Community acquired pneumonia 04/24/2023   Recurrent right pleural effusion 04/21/2023   Acute on chronic respiratory failure with hypoxia (HCC) 04/21/2023   GERD without esophagitis 04/21/2023   Paroxysmal atrial fibrillation (HCC) 04/21/2023   Pneumonia 04/21/2023   Anxiety 04/17/2023   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   Sacral fracture, closed (HCC) 04/15/2023   Fall 04/14/2023   Lumbar compression fracture (HCC) 04/14/2023   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   Chronic kidney disease, stage 3a (HCC) 04/07/2023   Atrial fibrillation, chronic (HCC) 04/07/2023   DVT (deep venous thrombosis) (HCC) 04/07/2023   Leukocytosis 04/07/2023   Right foot pain  04/07/2023   Acute sinusitis 05/23/2022   COPD exacerbation (HCC) 05/22/2022   Atrial fibrillation with RVR (HCC) 05/22/2022   Chest pain 05/22/2022   HTN (hypertension) 05/22/2022   Diabetes mellitus without complication (HCC) 05/22/2022   COPD (chronic obstructive pulmonary disease) (HCC) 05/22/2022   Hypothyroidism 05/22/2022   Iron deficiency anemia 05/22/2022   Osteoarthritis of right knee 03/14/2022     Past Medical History:  Diagnosis Date   Anemia    Asthma    COPD (chronic obstructive pulmonary disease) (HCC)    Diabetes mellitus without complication (HCC)    type 2   DVT (deep venous thrombosis) (HCC) 1980   GERD (gastroesophageal reflux disease)    History of hiatal hernia    Hypertension    Hypothyroidism      Past Surgical History:  Procedure Laterality Date   CATARACT EXTRACTION Bilateral    CHOLECYSTECTOMY     COLONOSCOPY     ESOPHAGOGASTRODUODENOSCOPY (EGD) WITH PROPOFOL N/A 01/17/2022   Procedure: ESOPHAGOGASTRODUODENOSCOPY (EGD) WITH PROPOFOL;  Surgeon: Jaynie Collins, DO;  Location: [facility] ENDOSCOPY;  Service: Gastroenterology;  Laterality: N/A;   HUMERUS SURGERY Left    fracture (7 surgeries total)   IR BONE MARROW BIOPSY & ASPIRATION  04/26/2023   MENISCUS REPAIR Right 2019   THYROIDECTOMY     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; reconstruction from hip bone   TONSILECTOMY, ADENOIDECTOMY, BILATERAL MYRINGOTOMY AND TUBES     TOTAL KNEE ARTHROPLASTY Right 03/14/2022   Procedure: TOTAL  KNEE ARTHROPLASTY;  Surgeon: Reinaldo Berber, MD;  Location: [facility] ORS;  Service: Orthopedics;  Laterality: Right;   TOTAL SHOULDER REPLACEMENT Left    TUBAL LIGATION     WRIST SURGERY Left    multiple fractures from falls (14 surgeries from wrist to shoulder)    Social History   Socioeconomic History   Marital status: Widowed    Spouse name: Not on file   Number of children: 6   Years of education: Not on file   Highest education level: Not on file   Occupational History   Not on file  Tobacco Use   Smoking status: Never   Smokeless tobacco: Never  Vaping Use   Vaping status: Never Used  Substance and Sexual Activity   Alcohol use: Never   Drug use: Never   Sexual activity: Not Currently  Other Topics Concern   Not on file  Social History Narrative   Lives in senior housing in [place] (apt.)   Social Determinants of Health   Financial Resource Strain: Not on file  Food Insecurity: No Food Insecurity (04/21/2023)   Hunger Vital Sign    Worried About Running Out of Food in the Last Year: Never true    Ran Out of Food in the Last Year: Never true  Transportation Needs: No Transportation Needs (04/21/2023)   PRAPARE - Administrator, Civil Service (Medical): No    Lack of Transportation (Non-Medical): No  Physical Activity: Not on file  Stress: Not on file  Social Connections: Not on file  Intimate Partner Violence: Not At Risk (04/21/2023)   Humiliation, Afraid, Rape, and Kick questionnaire    Fear of Current or Ex-Partner: No    Emotionally Abused: No    Physically Abused: No    Sexually Abused: No     Family History  Problem Relation Age of Onset   Kidney disease Mother    Tuberculosis Father      Current Facility-Administered Medications:    acetaminophen (TYLENOL) tablet 650 mg, 650 mg, Oral, Q6H PRN, 650 mg at 04/25/23 2209 **OR** acetaminophen (TYLENOL) suppository 650 mg, 650 mg, Rectal, Q6H PRN, Mansy, Jan A, MD   albuterol (PROVENTIL) (2.5 MG/3ML) 0.083% nebulizer solution 2.5 mg, 2.5 mg, Nebulization, Q4H PRN, Charise Killian, MD   ALPRAZolam Prudy Feeler) tablet 0.25 mg, 0.25 mg, Oral, BID, Mansy, Jan A, MD, 0.25 mg at 04/26/23 1031   ALPRAZolam (XANAX) tablet 0.25 mg, 0.25 mg, Oral, BID PRN, Charise Killian, MD, 0.25 mg at 04/26/23 8119   amiodarone (PACERONE) tablet 100 mg, 100 mg, Oral, Daily, Mansy, Jan A, MD, 100 mg at 04/26/23 1032   apixaban (ELIQUIS) tablet 2.5 mg, 2.5 mg, Oral,  BID, Mansy, Jan A, MD, 2.5 mg at 04/26/23 1031   ascorbic acid (VITAMIN C) tablet 1,500 mg, 1,500 mg, Oral, Daily, Mansy, Jan A, MD, 1,500 mg at 04/26/23 1031   bismuth subsalicylate (PEPTO BISMOL) 262 MG/15ML suspension 30 mL, 30 mL, Oral, Q6H PRN, Mansy, Jan A, MD   diclofenac Sodium (VOLTAREN) 1 % topical gel 2 g, 2 g, Topical, QID PRN, Mansy, Jan A, MD   feeding supplement (ENSURE ENLIVE / ENSURE PLUS) liquid 237 mL, 237 mL, Oral, TID BM, Charise Killian, MD, 237 mL at 04/26/23 1029   fentaNYL (DURAGESIC) 12 MCG/HR 1 patch, 1 patch, Transdermal, Q72H, Charise Killian, MD, 1 patch at 04/26/23 1042   ferrous sulfate tablet 325 mg, 325 mg, Oral, Daily, Mansy, Jan A, MD, 325 mg at  04/26/23 1032   fluticasone (FLONASE) 50 MCG/ACT nasal spray 2 spray, 2 spray, Each Nare, Daily PRN, Mansy, Jan A, MD   furosemide (LASIX) injection 40 mg, 40 mg, Intravenous, Daily, Charise Killian, MD, 40 mg at 04/26/23 1034   guaiFENesin (MUCINEX) 12 hr tablet 600 mg, 600 mg, Oral, BID, Charise Killian, MD, 600 mg at 04/26/23 1031   HYDROcodone-acetaminophen (NORCO/VICODIN) 5-325 MG per tablet 1-2 tablet, 1-2 tablet, Oral, Q6H PRN, Charise Killian, MD, 2 tablet at 04/26/23 1031   ipratropium-albuterol (DUONEB) 0.5-2.5 (3) MG/3ML nebulizer solution 3 mL, 3 mL, Nebulization, BID, Charise Killian, MD, 3 mL at 04/26/23 1610   levothyroxine (SYNTHROID) tablet 125 mcg, 125 mcg, Oral, QAC breakfast, Mansy, Jan A, MD, 125 mcg at 04/25/23 0606   loratadine (CLARITIN) tablet 10 mg, 10 mg, Oral, Daily PRN, Mansy, Jan A, MD   magnesium hydroxide (MILK OF MAGNESIA) suspension 30 mL, 30 mL, Oral, Daily PRN, Mansy, Jan A, MD, 30 mL at 04/25/23 1215   magnesium oxide (MAG-OX) tablet 200 mg, 200 mg, Oral, Daily, Mansy, Jan A, MD, 200 mg at 04/26/23 1031   methylPREDNISolone sodium succinate (SOLU-MEDROL) 40 mg/mL injection 40 mg, 40 mg, Intravenous, Q8H, Charise Killian, MD, 40 mg at 04/26/23 0532   morphine  (PF) 2 MG/ML injection 2 mg, 2 mg, Intravenous, Q4H PRN, Charise Killian, MD, 2 mg at 04/22/23 1739   multivitamin with minerals tablet 1 tablet, 1 tablet, Oral, Daily, Charise Killian, MD, 1 tablet at 04/26/23 1031   nitroGLYCERIN (NITROSTAT) SL tablet 0.4 mg, 0.4 mg, Sublingual, Q5 min PRN, Mansy, Jan A, MD   olopatadine (PATANOL) 0.1 % ophthalmic solution 1 drop, 1 drop, Both Eyes, Daily PRN, Mansy, Jan A, MD   omega-3 acid ethyl esters (LOVAZA) capsule 1 g, 1 capsule, Oral, Daily, Mansy, Jan A, MD, 1 g at 04/26/23 1031   ondansetron (ZOFRAN) tablet 4 mg, 4 mg, Oral, Q6H PRN **OR** ondansetron (ZOFRAN) injection 4 mg, 4 mg, Intravenous, Q6H PRN, Mansy, Jan A, MD, 4 mg at 04/21/23 1419   pantoprazole (PROTONIX) EC tablet 40 mg, 40 mg, Oral, Daily, Mansy, Jan A, MD, 40 mg at 04/26/23 1031   polyvinyl alcohol (LIQUIFILM TEARS) 1.4 % ophthalmic solution 2 drop, 2 drop, Both Eyes, Daily, Mansy, Jan A, MD, 2 drop at 04/26/23 1034   sodium chloride (OCEAN) 0.65 % nasal spray 1 spray, 1 spray, Each Nare, PRN, Mansy, Jan A, MD   traZODone (DESYREL) tablet 50 mg, 50 mg, Oral, QHS PRN, Mansy, Jan A, MD, 50 mg at 04/23/23 2259   Physical exam:  Vitals:   04/26/23 0930 04/26/23 0945 04/26/23 1000 04/26/23 1024  BP: 123/68 (!) 118/55 110/60 122/65  Pulse: 88 81 80 89  Resp: 17 14 14 14   Temp:    97.7 F (36.5 C)  TempSrc:    Oral  SpO2: 92% 92% 92%   Weight:      Height:       Physical Exam Constitutional:      General: She is not in acute distress.    Appearance: She is not ill-appearing or diaphoretic.  HENT:     Head: Normocephalic and atraumatic.  Eyes:     General: No scleral icterus. Cardiovascular:     Rate and Rhythm: Normal rate.  Pulmonary:     Effort: Pulmonary effort is normal. No respiratory distress.     Breath sounds: Wheezing present.  Abdominal:     General: Bowel sounds  are normal. There is no distension.     Palpations: Abdomen is soft.  Musculoskeletal:         General: Normal range of motion.  Skin:    General: Skin is warm and dry.     Findings: No erythema.  Neurological:     Mental Status: She is alert and oriented to person, place, and time. Mental status is at baseline.     Cranial Nerves: No cranial nerve deficit.     Motor: No abnormal muscle tone.  Psychiatric:        Mood and Affect: Affect normal.     Comments: anxious       Labs    Latest Ref Rng & Units 04/26/2023    4:37 AM 04/25/2023    4:26 AM 04/24/2023    4:34 AM  CBC  WBC 4.0 - 10.5 K/uL 13.1  10.5  7.5   Hemoglobin 12.0 - 15.0 g/dL 93.2  9.9  35.5   Hematocrit 36.0 - 46.0 % 30.4  30.0  31.1   Platelets 150 - 400 K/uL 241  187  163       Latest Ref Rng & Units 04/26/2023    4:37 AM 04/25/2023    4:26 AM 04/24/2023    4:34 AM  CMP  Glucose 70 - 99 mg/dL 732  202  542   BUN 8 - 23 mg/dL 37  25  17   Creatinine 0.44 - 1.00 mg/dL 7.06  2.37  6.28   Sodium 135 - 145 mmol/L 137  133  135   Potassium 3.5 - 5.1 mmol/L 4.9  4.2  4.1   Chloride 98 - 111 mmol/L 97  95  96   CO2 22 - 32 mmol/L 31  29  30    Calcium 8.9 - 10.3 mg/dL 8.8  8.3  8.4      RADIOGRAPHIC STUDIES: I have personally reviewed the radiological images as listed and agreed with the findings in the report. IR BONE MARROW BIOPSY & ASPIRATION  Result Date: 04/26/2023 INDICATION: Lymphoma EXAM: Bone marrow aspiration and core biopsy using fluoroscopic guidance MEDICATIONS: None. ANESTHESIA/SEDATION: Moderate (conscious) sedation was employed during this procedure. A total of Versed 1.5 mg and Fentanyl 50 mcg was administered intravenously. Moderate Sedation Time: 23 minutes. The patient's level of consciousness and vital signs were monitored continuously by radiology nursing throughout the procedure under my direct supervision. FLUOROSCOPY TIME:  Fluoroscopy Time: 0.8 minutes (11 mGy) COMPLICATIONS: None immediate. PROCEDURE: Informed written consent was obtained from the patient after a thorough  discussion of the procedural risks, benefits and alternatives. All questions were addressed. Maximal Sterile Barrier Technique was utilized including caps, mask, sterile gowns, sterile gloves, sterile drape, hand hygiene and skin antiseptic. A timeout was performed prior to the initiation of the procedure. The patient was placed prone on the exam table. Limited fluoroscopy of the pelvis was performed for planning purposes. Skin entry site was marked, and the overlying skin was prepped and draped in the standard sterile fashion. Local analgesia was obtained with 1% lidocaine. Using fluoroscopic guidance, an 11 gauge needle was advanced just deep to the cortex of the right posterior ilium. Subsequently, bone marrow aspiration and core biopsy were performed. Specimens were submitted to lab/pathology for handling. Hemostasis was achieved with manual pressure, and a clean dressing was placed. The patient tolerated the procedure well without immediate complication. IMPRESSION: Successful bone marrow aspiration and core biopsy of the right posterior ilium using fluoroscopic guidance. Electronically Signed  By: Olive Bass M.D.   On: 04/26/2023 12:17   NM PET Image Initial (PI) Skull Base To Thigh  Result Date: 04/24/2023 CLINICAL DATA:  Initial treatment strategy for weight loss, monoclonal B-cell lymphocytosis, and right pleural effusion. EXAM: NUCLEAR MEDICINE PET SKULL BASE TO THIGH TECHNIQUE: 7.0 mCi F-18 FDG was injected intravenously. Full-ring PET imaging was performed from the skull base to thigh after the radiotracer. CT data was obtained and used for attenuation correction and anatomic localization. Fasting blood glucose: 119 mg/dl COMPARISON:  CT chest abdomen pelvis dated 04/14/2023 FINDINGS: Mediastinal blood pool activity: SUV max 2.6 Liver activity: SUV max NA NECK: No hypermetabolic lymph nodes in the neck. Incidental CT findings: None. CHEST: No hypermetabolic mediastinal or hilar nodes. No  suspicious pulmonary nodules on the CT scan. Moderate right pleural effusion without convincing nodularity or hypermetabolism. Right middle and right lower lobe compressive atelectasis. Incidental CT findings: Atherosclerotic calcifications of the aortic arch. Mitral valve annular calcifications. Mild coronary atherosclerosis of the LAD. ABDOMEN/PELVIS: No abnormal hypermetabolic activity within the liver, pancreas, adrenal glands, or spleen. No hypermetabolic lymph nodes in the abdomen or pelvis. Nodular hepatic contour, suggesting cirrhosis. Mild diffuse hypermetabolism in the gastric antrum, max SUV 5.6, favored to be reactive or less likely reflecting gastritis. However, there is mild polypoid soft tissue prominence at the pylorus on CT (series 6/image 88), equivocal. Consider direct visualization as clinically warranted. Incidental CT findings: Status post cholecystectomy. Atherosclerotic calcifications of the abdominal aorta and branch vessels. Left extrarenal pelvis. Sigmoid diverticulosis, without evidence of diverticulitis. SKELETON: No focal hypermetabolic activity to suggest skeletal metastasis. Incidental CT findings: Status post left shoulder arthroplasty with posttraumatic deformity involving the distal humerus. Degenerative changes of the visualized thoracolumbar spine. IMPRESSION: No findings specific for malignancy. No suspicious lymphadenopathy in this patient with B-cell lymphocytosis. 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 Possible distal gastritis. Equivocal polypoid soft tissue thickening at the pylorus, consider upper endoscopy for direct visualization as clinically warranted. Electronically Signed   By: Charline Bills M.D.   On: 04/24/2023 10:06   DG Chest Port 1 View  Result Date: 04/22/2023 CLINICAL DATA:  Shortness of breath EXAM: PORTABLE CHEST 1 VIEW COMPARISON:  04/21/2023 FINDINGS: Large right pleural effusion with right lower lobe atelectasis or infiltrate,  similar to prior study. No confluent opacity on the left. Heart and mediastinal contours within normal limits. Aortic atherosclerosis. No acute bony abnormality. IMPRESSION: Large right pleural effusion with right lower lobe atelectasis or infiltrate, unchanged. Electronically Signed   By: Charlett Nose M.D.   On: 04/22/2023 19:19   DG Chest Port 1 View  Result Date: 04/21/2023 CLINICAL DATA:  Pleural effusion status post thoracentesis. EXAM: PORTABLE CHEST 1 VIEW COMPARISON:  04/20/2023. FINDINGS: Cardiac silhouette is obscured. Large right-sided pleural effusion. Left lung clear. Old healed adjacent left-sided posterior fifth and sixth rib fractures. Calcified aorta. Thoracic degenerative changes. No pneumothorax. IMPRESSION: Large right-sided pleural effusion. Electronically Signed   By: Layla Maw M.D.   On: 04/21/2023 18:16   US THORACENTESIS ASP PLEURAL SPACE W/IMG GUIDE  Result Date: 04/21/2023 INDICATION: Patient with history of COPD, lymphoma and recurrent right pleural effusion. Request for diagnostic and therapeutic right thoracentesis EXAM: ULTRASOUND GUIDED RIGHT THORACENTESIS MEDICATIONS: 8 mL 1% lidocaine COMPLICATIONS: None immediate. PROCEDURE: An ultrasound guided thoracentesis was thoroughly discussed with the patient and questions answered. The benefits, risks, alternatives and complications were also discussed. The patient understands and wishes to proceed with the procedure. Written consent was obtained. Ultrasound was  performed to localize and mark an adequate pocket of fluid in the right chest. Pleural effusion noted to be mildly loculated and the largest pocket was selected for drainage. The area was then prepped and draped in the normal sterile fashion. 1% Lidocaine was used for local anesthesia. Under ultrasound guidance a 6 Fr Safe-T-Centesis catheter was introduced. Thoracentesis was performed. The catheter was removed and a dressing applied. FINDINGS: A total of  approximately 400 mL of serosanguineous fluid was removed. Samples were sent to the laboratory as requested by the clinical team. IMPRESSION: Successful ultrasound guided right thoracentesis yielding 400 mL of pleural fluid. Performed by Lynnette Caffey, PA-C Electronically Signed   By: Irish Lack M.D.   On: 04/21/2023 15:52   DG Chest Port 1 View  Result Date: 04/20/2023 CLINICAL DATA:  Shortness of breath EXAM: PORTABLE CHEST 1 VIEW COMPARISON:  Radiographs 04/12/2023 and PET/CT 04/19/2023 FINDINGS: Moderate-large right pleural effusion and associated airspace opacities similar to PET/CT 04/19/2023 given differences in technique. The left lung is clear. Stable cardiomediastinal silhouette. Aortic atherosclerotic calcification. Loop recorder. Left reverse TSA. IMPRESSION: Moderate-large right pleural effusion and associated airspace opacities. Electronically Signed   By: Minerva Fester M.D.   On: 04/20/2023 22:38   CT L-SPINE NO CHARGE  Result Date: 04/14/2023 CLINICAL DATA:  77 year old female fell, found down. On blood thinners. Head hematoma. EXAM: CT LUMBAR SPINE WITHOUT CONTRAST TECHNIQUE: Technique: Multiplanar CT images of the lumbar spine were reconstructed from contemporary CT of the Abdomen and Pelvis. RADIATION DOSE REDUCTION: This exam was performed according to the departmental dose-optimization program which includes automated exposure control, adjustment of the mA and/or kV according to patient size and/or use of iterative reconstruction technique. CONTRAST:  None COMPARISON:  CT thoracic, CT Chest, Abdomen, and Pelvis today reported separately. FINDINGS: Segmentation: Normal. Alignment: Mild dextroconvex upper and levoconvex lower lumbar scoliosis. Straightening of lordosis. No significant spondylolisthesis. Vertebrae: Diffuse osteopenia. L1, L2, and L3 vertebrae appear intact. Mild L4 superior endplate compression (sagittal image 64, it is eccentric to the right (coronal image 48).  L4 vertebra otherwise intact. L5 vertebra appears intact. Superior sacrum and SI joints appear intact. But there is a subtle, minimally displaced fracture of the lower sacrum in the midline S5 (sagittal image 62). Paraspinal and other soft tissues: Abdomen and pelvis are detailed separately. Lumbar paraspinal soft tissues are within normal limits. Disc levels: Lumbar spine degeneration with multilevel vacuum disc. Moderate to severe disc space loss with disc osteophyte disease L2-L3 through L3-L4. Estimated lumbar spinal stenosis by CT as follows L2-L3: Mild to moderate, and with lateral recess stenosis greater on the left. L3-L4:  Mild-to-moderate. L4-L5:  Moderate to severe (series 2, image 82). IMPRESSION: 1. Osteopenia. Positive for a minimally displaced acute fracture of the lower sacrum at S5. And a mild L4 superior endplate compression fracture is age indeterminate. 2. Advanced lumbar spine disc and endplate degeneration contributing to multilevel spinal stenosis which is moderate to severe at L4-L5 and mild to moderate at L2-L3 and L3-L4. 3. CT Abdomen and Pelvis today reported separately. Electronically Signed   By: Odessa Fleming M.D.   On: 04/14/2023 07:19   CT T-SPINE NO CHARGE  Result Date: 04/14/2023 CLINICAL DATA:  77 year old female fell, found down. On blood thinners. Head hematoma. EXAM: CT THORACIC SPINE WITHOUT CONTRAST TECHNIQUE: Multiplanar CT images of the thoracic spine were reconstructed from contemporary CT of the Chest. RADIATION DOSE REDUCTION: This exam was performed according to the departmental dose-optimization program which includes automated exposure  control, adjustment of the mA and/or kV according to patient size and/or use of iterative reconstruction technique. CONTRAST:  None COMPARISON:  CT cervical spine, CT Chest, Abdomen, and Pelvis today reported separately. Recent chest CT 04/07/2023. FINDINGS: Limited cervical spine imaging:  Reported separately. Thoracic spine segmentation:  Appears normal; hypoplastic left 12th rib. Alignment: Stable thoracic kyphosis from earlier this month. No significant scoliosis or spondylolisthesis. Vertebrae: Chronic osteopenia. Subtle loss of height or compression at T5, T6, T11 is unchanged from earlier this month. No acute thoracic vertebral fracture identified. Visible posterior ribs appear intact. Paraspinal and other soft tissues: Chest and abdomen are detailed separately. Thoracic paraspinal soft tissues remain within normal limits. Disc levels: Thoracic spine degeneration although mostly anterior flowing endplate osteophytes. Partially calcified disc and/or endplate spurring at T10-T11 with evidence of mild chronic thoracic spinal stenosis there. Superimposed facet hypertrophy. Spinal stenosis could be mild or moderate. IMPRESSION: 1. Stable CT appearance of the Thoracic Spine. Osteopenia, No acute traumatic injury identified. 2. 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 3. CT Chest, Abdomen, and Pelvis today reported separately. Electronically Signed   By: Odessa Fleming M.D.   On: 04/14/2023 07:14   CT CHEST ABDOMEN PELVIS WO CONTRAST  Result Date: 04/14/2023 CLINICAL DATA:  77 year old female fell, found down. On blood thinners. Hematoma. EXAM: CT CHEST, ABDOMEN AND PELVIS WITHOUT CONTRAST TECHNIQUE: Multidetector CT imaging of the chest, abdomen and pelvis was performed following the standard protocol without IV contrast. RADIATION DOSE REDUCTION: This exam was performed according to the departmental dose-optimization program which includes automated exposure control, adjustment of the mA and/or kV according to patient size and/or use of iterative reconstruction technique. COMPARISON:  Thoracic and lumbar CT today reported separately. Recent chest CT 04/07/2023. FINDINGS: CT CHEST FINDINGS Cardiovascular: Calcified aortic atherosclerosis. Calcified coronary artery atherosclerosis. Stable noncontrast appearance of the heart and  aorta. No cardiomegaly or pericardial effusion. Mediastinum/Nodes: Stable noncontrast mediastinum. No evidence of mass, lymphadenopathy, mediastinal hematoma. Lungs/Pleura: Layering right pleural effusion with simple fluid density remains moderate to large, slightly decreased compared to 04/07/2023. No contralateral left pleural effusion. Major airways remain patent. Compressive right lung atelectasis. Right lower lobe ventilation has mildly improved. No pneumothorax. Musculoskeletal: Left shoulder arthroplasty. Chronic posttraumatic and/or degenerative changes at the right shoulder. Chronic left rib fractures. Thoracic spine is reported separately. No sternal fracture. CT ABDOMEN PELVIS FINDINGS Hepatobiliary: Nodular liver contour suggestive of cirrhosis (series 2, image 69). No discrete liver lesion in the absence of IV contrast. Cholecystectomy. Pancreas: Negative. Spleen: Negative, no splenomegaly.  No perisplenic fluid. Adrenals/Urinary Tract: Negative noncontrast adrenal glands, kidneys, bladder. Pelvic phleboliths. Stomach/Bowel: Postoperative changes to the right ventral abdominal wall, probably chronic cholecystectomy related. Small fat containing umbilical hernia series 2, image 83. Nearby ventral abdominal wall subcutaneous fat stranding which is probably subcutaneous injection sites (series 2, image 93). No herniated bowel. No dilated large or small bowel. Severe diverticulosis of the sigmoid colon in the pelvis. No active large bowel inflammation. Appendicoliths on series 2, image 93, but otherwise normal appendix (coronal image 44). Decompressed terminal ileum. Decompressed stomach. No free air or free fluid. No mesenteric inflammation identified. Vascular/Lymphatic: Extensive Aortoiliac calcified atherosclerosis. Vascular patency is not evaluated in the absence of IV contrast. Normal caliber abdominal aorta. No lymphadenopathy identified. Reproductive: Retroverted, otherwise negative noncontrast  appearance. Other: No pelvis free fluid. Musculoskeletal: Lumbar spine is detailed separately. Sacrum, SI joints, pelvis, and proximal femurs appear intact. No superficial soft tissue injury identified. IMPRESSION: 1. No acute traumatic injury identified  in the noncontrast chest, abdomen, or pelvis. See CT thoracic and lumbar spine reported separately. 2. Moderate to large layering right pleural effusion has slightly decreased compared to 04/07/2023. Simple fluid density favors transudate (see #3). Compressive right lung atelectasis. 3. Nodular liver contour strongly suggests Cirrhosis. But no ascites, no splenomegaly. 4.  Aortic Atherosclerosis (ICD10-I70.0). Electronically Signed   By: Odessa Fleming M.D.   On: 04/14/2023 07:09   CT Cervical Spine Wo Contrast  Result Date: 04/14/2023 CLINICAL DATA:  77 year old female fell, found down. On blood thinners. Hematoma. EXAM: CT CERVICAL SPINE WITHOUT CONTRAST TECHNIQUE: Multidetector CT imaging of the cervical spine was performed without intravenous contrast. Multiplanar CT image reconstructions were also generated. RADIATION DOSE REDUCTION: This exam was performed according to the departmental dose-optimization program which includes automated exposure control, adjustment of the mA and/or kV according to patient size and/or use of iterative reconstruction technique. COMPARISON:  Head CT today.  Cervical spine CT 03/05/2023. FINDINGS: Alignment: Straightening of cervical lordosis. Cervicothoracic junction alignment is within normal limits. Bilateral posterior element alignment is within normal limits. Skull base and vertebrae: Visualized skull base is intact. No atlanto-occipital dissociation. C1 and C2 appear intact and aligned. No acute osseous abnormality identified. Soft tissues and spinal canal: No prevertebral fluid or swelling. No visible canal hematoma. Partially retropharyngeal carotids. Calcified atherosclerosis. Mild pharynx motion artifact. Disc levels: Stable  cervical spine degeneration. Multilevel disc and endplate degeneration with some ligamentous hypertrophy. 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 Upper chest: Thoracic spine and chest CT today reported separately. IMPRESSION: 1. No acute traumatic injury identified in the cervical spine. 2. 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 Electronically Signed   By: Odessa Fleming M.D.   On: 04/14/2023 06:59   CT HEAD WO CONTRAST ( )  Result Date: 04/14/2023 CLINICAL DATA:  77 year old female fell, found down. On blood thinners. Head hematoma. EXAM: CT HEAD WITHOUT CONTRAST TECHNIQUE: Contiguous axial images were obtained from the base of the skull through the vertex without intravenous contrast. RADIATION DOSE REDUCTION: This exam was performed according to the departmental dose-optimization program which includes automated exposure control, adjustment of the mA and/or kV according to patient size and/or use of iterative reconstruction technique. COMPARISON:  Head CT 03/05/2023. FINDINGS: Brain: Cerebral volume is stable, within normal limits for age. No midline shift, ventriculomegaly, mass effect, evidence of mass lesion, intracranial hemorrhage or evidence of cortically based acute infarction. Stable mild for age patchy white matter hypodensity. Otherwise normal gray-white differentiation. Vascular: Calcified atherosclerosis at the skull base. No suspicious intracranial vascular hyperdensity. Skull: Stable and intact. Sinuses/Orbits: Visualized paranasal sinuses and mastoids are stable and well aerated. Other: There is a mild right posterior convexity scalp hematoma or contusion on series 3, image 35. Underlying calvarium appears intact. No other acute orbit or scalp soft tissue injury identified. IMPRESSION: 1. Right posterior convexity scalp hematoma or contusion. No skull fracture. 2. No acute intracranial abnormality. Stable and largely negative for age  non contrast CT appearance of the brain. Electronically Signed   By: Odessa Fleming M.D.   On: 04/14/2023 06:55   DG Chest Port 1 View  Result Date: 04/12/2023 CLINICAL DATA:  Cough, shortness of breath EXAM: PORTABLE CHEST 1 VIEW COMPARISON:  04/11/2023 FINDINGS: Heart size is normal. Dense mitral annulus calcifications. Benign calcified granulomatous mediastinal lymph nodes. Implantable loop recorder. No acute airspace opacity. Benign calcified nodule specific status post left shoulder reverse arthroplasty. IMPRESSION: No acute abnormality of the lungs in AP portable projection.  Electronically Signed   By: Jearld Lesch M.D.   On: 04/12/2023 09:10   DG Chest Port 1 View  Result Date: 04/11/2023 CLINICAL DATA:  Pleural effusion. EXAM: PORTABLE CHEST 1 VIEW COMPARISON:  April 10, 2023. FINDINGS: The heart size and mediastinal contours are within normal limits. Status post left shoulder arthroplasty. Old left rib fractures are noted. Increased right basilar opacity is noted concerning for worsening effusion and associated atelectasis or infiltrate. Left lung is unremarkable. IMPRESSION: Increased right basilar opacity is noted concerning for worsening effusion and associated atelectasis or infiltrate. Electronically Signed   By: Lupita Raider M.D.   On: 04/11/2023 08:51   ECHOCARDIOGRAM COMPLETE  Result Date: 04/10/2023    ECHOCARDIOGRAM REPORT   Patient Name:   BROOKE-LYNN TRUST Date of Exam: 04/09/2023 Medical Rec #:  [id number]      Height:       62.0 in Accession #:    [id number]     Weight:       141.5 lb Date of Birth:  09/10/1945       BSA:          1.650 m Patient Age:    77 years       BP:           121/76 mmHg Patient Gender: F              HR:           58 bpm. Exam Location:  [facility] Procedure: 2D Echo, Cardiac Doppler and Color Doppler Indications:    DOE R06.0  History:        Patient has prior history of Echocardiogram examinations, most                 recent 05/24/2022. COPD, Arrythmias:Atrial  Fibrillation; Risk                 Factors:Hypertension and Diabetes.  Sonographer:    Dondra Prader RVT RCS Referring Phys: [id number] Sunnie Nielsen  Sonographer Comments: Patient unable to tolerate probe pressure on abdomen; no subcostal views. IMPRESSIONS  1. Left ventricular ejection fraction, by estimation, is 55 to 60%. The left ventricle has normal function. The left ventricle has no regional wall motion abnormalities. Left ventricular diastolic parameters are consistent with Grade I diastolic dysfunction (impaired relaxation).  2. Right ventricular systolic function is normal. The right ventricular size is normal. There is normal pulmonary artery systolic pressure.  3. Left atrial size was mildly dilated.  4. The mitral valve is normal in structure. Mild mitral valve regurgitation. Mild to moderate mitral stenosis. The mean mitral valve gradient is 6.0 mmHg. Severe mitral annular calcification.  5. The aortic valve is normal in structure. Aortic valve regurgitation is trivial. No aortic stenosis is present.  6. The inferior vena cava is normal in size with greater than 50% respiratory variability, suggesting right atrial pressure of 3 mmHg. FINDINGS  Left Ventricle: Left ventricular ejection fraction, by estimation, is 55 to 60%. The left ventricle has normal function. The left ventricle has no regional wall motion abnormalities. The left ventricular internal cavity size was normal in size. There is  no left ventricular hypertrophy. Left ventricular diastolic parameters are consistent with Grade I diastolic dysfunction (impaired relaxation). Right Ventricle: The right ventricular size is normal. No increase in right ventricular wall thickness. Right ventricular systolic function is normal. There is normal pulmonary artery systolic pressure. The tricuspid regurgitant velocity is 2.31 m/s, and  with an assumed right  atrial pressure of 3 mmHg, the estimated right ventricular systolic pressure is 24.3 mmHg. Left  Atrium: Left atrial size was mildly dilated. Right Atrium: Right atrial size was normal in size. Pericardium: There is no evidence of pericardial effusion. Mitral Valve: The mitral valve is normal in structure. There is moderate thickening of the mitral valve leaflet(s). There is moderate calcification of the mitral valve leaflet(s). Severe mitral annular calcification. Mild mitral valve regurgitation. Mild  to moderate mitral valve stenosis. The mean mitral valve gradient is 6.0 mmHg. Tricuspid Valve: The tricuspid valve is normal in structure. Tricuspid valve regurgitation is mild . No evidence of tricuspid stenosis. Aortic Valve: The aortic valve is normal in structure. Aortic valve regurgitation is trivial. Aortic regurgitation PHT measures 600 msec. No aortic stenosis is present. Aortic valve mean gradient measures 7.0 mmHg. Aortic valve peak gradient measures 12.7 mmHg. Aortic valve area, by VTI measures 1.18 cm. Pulmonic Valve: The pulmonic valve was normal in structure. Pulmonic valve regurgitation is not visualized. No evidence of pulmonic stenosis. Aorta: The aortic root is normal in size and structure. Venous: The inferior vena cava is normal in size with greater than 50% respiratory variability, suggesting right atrial pressure of 3 mmHg. IAS/Shunts: No atrial level shunt detected by color flow Doppler.  LEFT VENTRICLE PLAX 2D LVIDd:         4.30 cm   Diastology LVIDs:         3.10 cm   LV e' medial:    4.56 cm/s LV PW:         1.00 cm   LV E/e' medial:  24.6 LV IVS:        0.80 cm   LV e' lateral:   4.87 cm/s LVOT diam:     1.40 cm   LV E/e' lateral: 23.0 LV SV:         43 LV SV Index:   26 LVOT Area:     1.54 cm  RIGHT VENTRICLE RV Basal diam:  2.60 cm RV S prime:     11.30 cm/s TAPSE (M-mode): 1.7 cm LEFT ATRIUM             Index        RIGHT ATRIUM          Index LA diam:        4.40 cm 2.67 cm/m   RA Area:     7.81 cm LA Vol (A2C):   50.9 ml 30.84 ml/m  RA Volume:   13.80 ml 8.36 ml/m LA Vol  (A4C):   42.4 ml 25.69 ml/m LA Biplane Vol: 49.3 ml 29.87 ml/m  AORTIC VALVE                     PULMONIC VALVE AV Area (Vmax):    1.37 cm      PV Vmax:       1.06 m/s AV Area (Vmean):   1.26 cm      PV Peak grad:  4.5 mmHg AV Area (VTI):     1.18 cm AV Vmax:           178.00 cm/s AV Vmean:          116.000 cm/s AV VTI:            0.369 m AV Peak Grad:      12.7 mmHg AV Mean Grad:      7.0 mmHg LVOT Vmax:         158.00 cm/s  LVOT Vmean:        94.800 cm/s LVOT VTI:          0.282 m LVOT/AV VTI ratio: 0.76 AI PHT:            600 msec  AORTA Ao Root diam: 2.80 cm Ao Asc diam:  3.60 cm MITRAL VALVE                TRICUSPID VALVE MV Area (PHT): 2.07 cm     TR Peak grad:   21.3 mmHg MV Mean grad:  6.0 mmHg     TR Vmax:        231.00 cm/s MV Decel Time: 366 msec MR Peak grad: 11.8 mmHg     SHUNTS MR Mean grad: 6.0 mmHg      Systemic VTI:  0.28 m MR Vmax:      172.00 cm/s   Systemic Diam: 1.40 cm MR Vmean:     116.0 cm/s MV E velocity: 112.00 cm/s MV A velocity: 167.00 cm/s MV E/A ratio:  0.67 Lorine Bears MD Electronically signed by Lorine Bears MD Signature Date/Time: 04/10/2023/5:17:03 PM    Final    DG Chest Port 1 View  Result Date: 04/10/2023 CLINICAL DATA:  142230 Pleural effusion 142230. EXAM: PORTABLE CHEST 1 VIEW COMPARISON:  Chest radiograph 04/09/2023. FINDINGS: Decreased, trace residual right pleural effusion. Increasing airspace opacity in the medial aspect of the right lung base, suspicious for aspiration or developing infection. No pneumothorax. IMPRESSION: Decreased, trace residual right pleural effusion. Increasing airspace opacity in the medial aspect of the right lung base, suspicious for aspiration or developing infection. Electronically Signed   By: Orvan Falconer M.D.   On: 04/10/2023 12:47   DG Chest Port 1 View  Result Date: 04/09/2023 CLINICAL DATA:  Post thoracentesis EXAM: PORTABLE CHEST 1 VIEW COMPARISON:  CT chest 04/07/2023, chest x-ray 05/22/2022 FINDINGS: Left-sided  electronic recording device. Normal cardiac size. Mitral annular calcifications. Left lung grossly clear. Decreased right pleural effusion with small moderate residual pleural effusion. No convincing right pneumothorax. Airspace disease at the right base. Aortic atherosclerosis. Old appearing left-sided rib fractures. Left shoulder replacement IMPRESSION: Decreased right pleural effusion with small to moderate residual pleural effusion. No convincing pneumothorax. Airspace disease at the right base, atelectasis versus pneumonia. Electronically Signed   By: Jasmine Pang M.D.   On: 04/09/2023 16:27   US Abdomen Limited RUQ (LIVER/GB)  Result Date: 04/09/2023 CLINICAL DATA:  Cirrhosis EXAM: ULTRASOUND ABDOMEN LIMITED RIGHT UPPER QUADRANT COMPARISON:  None Available. FINDINGS: Gallbladder: Prior cholecystectomy. Common bile duct: Diameter: 6 mm. Liver: No focal lesion identified. Nodular liver contour. Normal parenchymal echogenicity. Portal vein is patent on color Doppler imaging with normal direction of blood flow towards the liver. Other: Right pleural effusion. IMPRESSION: 1. Cirrhotic liver morphology. 2. Right pleural effusion. Electronically Signed   By: Allegra Lai M.D.   On: 04/09/2023 12:59   DG Foot Complete Right  Result Date: 04/08/2023 CLINICAL DATA:  Right foot pain EXAM: RIGHT FOOT COMPLETE - 3+ VIEW COMPARISON:  None Available. FINDINGS: No fracture or dislocation is seen. Mild degenerative changes of the 1st MCP joint. Degenerative changes of the dorsal midfoot. Moderate posterior and plantar enthesophytes. IMPRESSION: No fracture or dislocation is seen. Degenerative changes, as above. Electronically Signed   By: Charline Bills M.D.   On: 04/08/2023 01:55   CT CHEST WO CONTRAST  Result Date: 04/07/2023 CLINICAL DATA:  Chest pain, shortness of breath EXAM: CT CHEST WITHOUT CONTRAST TECHNIQUE: Multidetector CT  imaging of the chest was performed following the standard protocol without  IV contrast. RADIATION DOSE REDUCTION: This exam was performed according to the departmental dose-optimization program which includes automated exposure control, adjustment of the mA and/or kV according to patient size and/or use of iterative reconstruction technique. COMPARISON:  CT 05/23/2022 FINDINGS: Cardiovascular: Heart size is upper limits of normal. No pericardial effusion. Thoracic aorta is nonaneurysmal. There are atherosclerotic vascular calcifications of the aorta and coronary arteries. Calcification of the mitral annulus. Central pulmonary vasculature is nondilated. Mediastinum/Nodes: No enlarged mediastinal or axillary lymph nodes. Thyroid gland, trachea, and esophagus demonstrate no significant findings. Small hiatal hernia. Lungs/Pleura: Moderate-large layering right-sided pleural effusion with complete atelectasis of the right lower lobe and near-complete atelectasis of the right middle lobe. Partial compressive atelectasis of the right upper lobe. Mild mosaic attenuation of the aerated portion of the right upper lobe and within the left lung. No left-sided pleural effusion. No pneumothorax. Upper Abdomen: Nodular hepatic surface contour. No acute abnormality. Musculoskeletal: No chest wall mass or suspicious bone lesions identified. Left chest wall loop recorder. IMPRESSION: 1. Moderate-large layering right-sided pleural effusion with complete atelectasis of the right lower lobe and near-complete atelectasis of the right middle lobe. Partial compressive atelectasis of the right upper lobe. 2. Mild mosaic attenuation of the aerated portion of the right upper lobe and within the left lung, which may be related to small airways disease. 3. Nodular hepatic surface contour, suggestive of cirrhosis. 4. Aortic and coronary artery atherosclerosis (ICD10-I70.0). Electronically Signed   By: Duanne Guess D.O.   On: 04/07/2023 15:25    Assessment and plan-   # Recurrent pleural effusion, Previous  cytology and fluid flow cytometry were positive for CD10 positive monoclonal B-cell population, suggesting a B-cell lymphoma process. However PET scan did not show any lymphadenopathy or soft tissue mass. Peripheral blood flow cytometry is negative. S/p BM biopsy.      Clinically it does not appear that she has disseminated lymphoma disease. Recommend thoracic surgery evaluation for VATS and pleural biopsy to help with cephalization of lymphoma. I discussed case with thoracic surgery Dr. Cliffton Asters.  Patient will establish with him next week outpatient. Repeat chest x-ray and a possible thoracentesis.  Recommend pleural catheter placement. Patient is very frail and has multiple other medical conditions.  I will consult palliative care service   # Pneumonia/COPD exacerbation, continue antibiotics, nebulizer, Solu-Medrol # History of cirrhosis, continue diuretics.  She will need GI evaluation outpatient. # Unintentional weight loss, recommend nutrition supplementation.  Recommend dietitian evaluation.   I called patient's son and updated above.   Thank you for allowing me to participate in the care of this patient.   Rickard Patience, MD, PhD Hematology Oncology 04/26/2023

## 2023-04-26 NOTE — Plan of Care (Signed)
  Problem: Health Behavior/Discharge Planning: Goal: Ability to manage health-related needs will improve Outcome: Not Progressing   Problem: Coping: Goal: Level of anxiety will decrease Outcome: Not Progressing   Problem: Education: Goal: Knowledge of General Education information will improve Description: Including pain rating scale, medication(s)/side effects and non-pharmacologic comfort measures Outcome: Progressing   Problem: Clinical Measurements: Goal: Ability to maintain clinical measurements within normal limits will improve Outcome: Progressing Goal: Will remain free from infection Outcome: Progressing Goal: Diagnostic test results will improve Outcome: Progressing Goal: Respiratory complications will improve Outcome: Progressing Goal: Cardiovascular complication will be avoided Outcome: Progressing   Problem: Activity: Goal: Risk for activity intolerance will decrease Outcome: Progressing   Problem: Nutrition: Goal: Adequate nutrition will be maintained Outcome: Progressing   Problem: Elimination: Goal: Will not experience complications related to bowel motility Outcome: Progressing Goal: Will not experience complications related to urinary retention Outcome: Progressing   Problem: Pain Management: Goal: General experience of comfort will improve Outcome: Progressing   Problem: Safety: Goal: Ability to remain free from injury will improve Outcome: Progressing   Problem: Skin Integrity: Goal: Risk for impaired skin integrity will decrease Outcome: Progressing

## 2023-04-27 ENCOUNTER — Inpatient Hospital Stay: Payer: Medicare (Managed Care)

## 2023-04-27 DIAGNOSIS — J90 Pleural effusion, not elsewhere classified: Secondary | ICD-10-CM | POA: Diagnosis not present

## 2023-04-27 DIAGNOSIS — Z7189 Other specified counseling: Secondary | ICD-10-CM | POA: Diagnosis not present

## 2023-04-27 LAB — CYTOLOGY - NON PAP

## 2023-04-27 LAB — SURGICAL PATHOLOGY

## 2023-04-27 MED ORDER — LIDOCAINE HCL (PF) 1 % IJ SOLN
10.0000 mL | Freq: Once | INTRAMUSCULAR | Status: AC
  Administered 2023-04-27: 10 mL via INTRADERMAL

## 2023-04-27 NOTE — Progress Notes (Signed)
   04/27/23 1500  Spiritual Encounters  Type of Visit Follow up  Care provided to: Patient  Reason for visit Advance directives  OnCall Visit Yes   Chaplain made several attempts throughout the day to complete AD with patient. No notary available today. Will pass to the Chaplain team for follow up tomorrow. Chaplain spiritual support services remain available as the need arises.

## 2023-04-27 NOTE — Progress Notes (Signed)
Mobility Specialist - Progress Note   04/27/23 1657  Mobility  Activity Ambulated with assistance to bathroom  Level of Assistance Contact guard assist, steadying assist  Assistive Device Front wheel walker  Distance Ambulated (ft) 24 ft  Activity Response Tolerated well  $Mobility charge 1 Mobility  Mobility Specialist Start Time (ACUTE ONLY) 1612  Mobility Specialist Stop Time (ACUTE ONLY) 1627  Mobility Specialist Time Calculation (min) (ACUTE ONLY) 15 min   Pt requesting to use the restroom. Pt completed bed mob MinA for trunk support. Upon transfer to bed Pt expressed "chest pain" "above my right breast", stating "it comes and goes"-- Rn notified. Pt STS to RW and amb to/from the bathroom CGA for safety, continued to express chest discomfort. Pt returned to bed, left supine with alarm set and needs within reach.  Zetta Bills Mobility Specialist 04/27/23 5:03 PM

## 2023-04-27 NOTE — Progress Notes (Signed)
OT Cancellation Note  Patient Details Name: [name] MRN: [id number] DOB: [birth year]   Cancelled Treatment:    Reason Eval/Treat Not Completed: Other (comment) (attempted to see patient for OT tx, pt in room with palliative NP and family. OT will reattempt as able.Oleta Mouse, OTD OTR/L  04/27/23, 10:49 AM

## 2023-04-27 NOTE — Progress Notes (Signed)
   04/27/23 1600  Spiritual Encounters  Type of Visit Follow up  Care provided to: Patient  Reason for visit Routine spiritual support  OnCall Visit Yes   Spiritual consult placed for prayer.  Chaplain visited patient and patient requests Chaplain to come back tomorrow for prayer instead of today because of her current pain. Chaplain team to follow up.

## 2023-04-27 NOTE — Progress Notes (Signed)
  PROGRESS NOTE    [name]  ZOX:[id number] DOB: 1945-08-17 DOA: 04/21/2023 PCP: [facility]  209A/209A-AA  LOS: 6 days   Brief hospital course:   Assessment & Plan: [name] is a 77 y.o. female with medical history significant for asthma, COPD, type diabetes mellitus, GERD, hypertension chronic hypoxic respiratory failure on home O2 at baseline of 2 L/min, and hypothyroidism, who presented to the emergency room with acute onset of worsening dyspnea with associated dry cough with right-sided chest pain with deep breathing.    Recurrent right pleural effusion: likely secondary to concern for B cell lymphoma. --s/p right thoracentesis with 400 ml removed on 11/15 --IR declined placement of pleural cath for now.   --Pt went for thoracentesis today instead.   Pneumonia:  --completed 6 days of ceftriaxone, 7 days of azithro  Acute COPD exacerbation Chronic hypoxemic respiratory failure on 2L O2 --dyspnea, wheezing, coughing --started on IV solumedrol, transitioned to prednisone daily Tussionex prn for cough.   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  Onco following and recs apprec  --bone marrow biopsy on 11/20 --plan for outpatient f/u with thoracic surgery for VATS and pleural biopsy    Hx of cirrhosis: as per pt's family.  Continue on lasix    Thrombocytopenia:  likely secondary to cirrhosis.    Anxiety:  --Xanax PRN   CKDIIIa ruled out CKD 2   Hypothyroidism:  --cont home Synthroid   PAF: continue on amio, eliquis    GERD: continue on PPI    DVT prophylaxis: WJ:[id number] Code Status: Full code  Family Communication:  Level of care: Telemetry Medical Dispo:   The patient is from: home Anticipated d/c is to: SNF rehab Anticipated d/c date is: whenever bed available   Subjective and Interval History:  Pt  reported continued dyspnea, especially at night, requiring higher flow of O2.  IR declined placement of pleural cath for now.  Pt went for thoracentesis instead.   Objective: Vitals:   04/27/23 0722 04/27/23 0927 04/27/23 1505 04/27/23 1536  BP:  130/81 (!) 139/90 (!) 144/100  Pulse:  81 87 86  Resp:  16    Temp:  97.7 F (36.5 C)    TempSrc:  Oral    SpO2: 96% 97% 96% 96%  Weight:      Height:       No intake or output data in the 24 hours ending 04/27/23 1751  Filed Weights   04/26/23 0500 04/26/23 0805 04/27/23 0450  Weight: 61.4 kg 61.4 kg 72.4 kg    Examination:   Constitutional: NAD, AAOx3, sitting at edge of bed HEENT: conjunctivae and lids normal, EOMI CV: No cyanosis.   RESP: normal respiratory effort, on 4L Neuro: II - XII grossly intact.   Psych: depressed and anxious mood and affect.     Data Reviewed: I have personally reviewed labs and imaging studies  Time spent: 35 minutes  Darlin Priestly, MD [facility] If 7PM-7AM, please contact night-coverage 04/27/2023, 5:51 PM

## 2023-04-27 NOTE — Progress Notes (Signed)
Physical Therapy Treatment Patient Details Name: [name] MRN: [id number] DOB: [birth year] Today's Date: 04/27/2023   History of Present Illness Patient is a 77 year old female with recurrent right pleural effusion. 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    PT Comments  Pt was slouched over with head resting on hands at EOB upon arrival. She is visibly more fatigued today versus previous date. Pt wanted to get OOB and ambulate. She tolerated 1 lap in hallway but remains overall limited by poor activity tolerance. Slightly SOB with minimal activity. DC recs remain appropriate. Acute PT will continue to follow and progress per current POC.    If plan is discharge home, recommend the following: A little help with walking and/or transfers;A little help with bathing/dressing/bathroom;Assist for transportation;Help with stairs or ramp for entrance;Assistance with cooking/housework     Equipment Recommendations  None recommended by PT       Precautions / Restrictions Precautions Precautions: Fall Precaution Comments: history of lumbar fracture and multi level stenosis. TLSO Restrictions Weight Bearing Restrictions: No     Mobility  Bed Mobility Overal bed mobility: Needs Assistance Bed Mobility: Sit to Supine, Sit to Sidelying  Sit to supine: Min assist Sit to sidelying: Min assist   Transfers Overall transfer level: Needs assistance Equipment used: Rolling walker (2 wheels) Transfers: Sit to/from Stand Sit to Stand: Contact guard assist     Ambulation/Gait Ambulation/Gait assistance: Contact guard assist Gait Distance (Feet): 200 Feet Assistive device: Rolling walker (2 wheels) Gait Pattern/deviations: Step-through pattern, Decreased stride  length Gait velocity: decreased  General Gait Details: Pt much more fatigue and limited today versus previous date.   Balance Overall balance assessment: Needs assistance Sitting-balance support: Feet supported, No upper extremity supported Sitting balance-Leahy Scale: Good     Standing balance support: Bilateral upper extremity supported Standing balance-Leahy Scale: Fair     Cognition Arousal: Alert Behavior During Therapy: WFL for tasks assessed/performed Overall Cognitive Status: Within Functional Limits for tasks assessed  General Comments: Pt is A and agreeable. Remains highly motivated for OOB activity           General Comments General comments (skin integrity, edema, etc.): Pt was visibly more fatigued today versus previous date.      Pertinent Vitals/Pain Pain Assessment Pain Assessment: 0-10 Pain Score: 4  Pain Location: back and tailbone with bed mobility Pain Descriptors / Indicators: Discomfort, Grimacing Pain Intervention(s): Limited activity within patient's tolerance, Monitored during session, Repositioned     PT Goals (current goals can now be found in the care plan section) Acute Rehab PT Goals Patient Stated Goal: rehab then home Progress towards PT goals: Progressing toward goals    Frequency    Min 1X/week       AM-PAC PT "6 Clicks" Mobility   Outcome Measure  Help needed turning from your back to your side while in a flat bed without using bedrails?: A Little Help needed moving from lying on your back to sitting on the side of a flat bed without using bedrails?: A Little Help needed moving to and from a bed to a chair (including a wheelchair)?: A Little Help needed standing up from a chair using your arms (e.g., wheelchair or bedside chair)?: A Little Help needed to walk in hospital room?: A Little  Help needed climbing 3-5 steps with a railing? : A Lot 6 Click Score: 17    End of Session Equipment Utilized During Treatment: Back  brace Activity Tolerance: Patient tolerated treatment well;Patient limited by fatigue Patient left: in bed;with call bell/phone within reach;with bed alarm set Nurse Communication: Mobility status PT Visit Diagnosis: Muscle weakness (generalized) (M62.81);Unsteadiness on feet (R26.81)     Time: 1610-9604 PT Time Calculation (min) (ACUTE ONLY): 14 min  Charges:    $Gait Training: 8-22 mins PT General Charges $$ ACUTE PT VISIT: 1 Visit                     Jetta Lout PTA 04/27/23, 3:27 PM

## 2023-04-27 NOTE — Plan of Care (Signed)

## 2023-04-27 NOTE — Procedures (Signed)
PROCEDURE SUMMARY:  Successful US guided right thoracentesis. Yielded 1.1 L of amber/bloody fluid. Pt tolerated procedure well. No immediate complications.  Specimen was not sent for labs. CXR ordered.  EBL < 1 mL  Cloretta Ned 04/27/2023 3:31 PM

## 2023-04-27 NOTE — Progress Notes (Signed)
Daily Progress Note   Patient Name: [name]       Date: 04/27/2023 DOB: [birth year]  Age: 77 y.o. MRN#: [id number] Attending Physician: Darlin Priestly, MD Primary Care Physician: [facility] Admit Date: 04/21/2023  Reason for Consultation/Follow-up: Establishing goals of care  Subjective: Note and labs reviewed.  Into see patient.  She is sitting up in bed with TLSO in place.  Currently patient's son [name], who is a mobility aid for [employer] as present at bedside.    We discussed her diagnoses, prognosis, GOC, EOL wishes disposition and options.  Created space and opportunity for patient  to explore thoughts and feelings regarding current medical information.   A detailed discussion was had today regarding advanced directives.  Concepts specific to code status, artifical feeding and hydration, IV antibiotics and rehospitalization were discussed.  The difference between an aggressive medical intervention path and a comfort care path was discussed.  Values and goals of care important to patient and family were attempted to be elicited.  Discussed limitations of medical interventions to prolong quality of life in some situations and discussed the concept of human mortality.  She advises she is a woman of faith and trusts in God to care for her.  Patient is clear she would want her son [name] to be her H POA.  She discusses wanting to live as long as possible for family, but also not wanting to suffer or be a burden on them.  Son advises that he will honor what ever his mother's wishes are.  They discuss family members who have died, and the suffering involved in their death.  We discussed multiple scenarios.  With conversation, patient states she would not want CPR, that she would  want to be left peacefully and naturally if she dies.  She states she would be amenable to ventilator support short-term.  She is clear she would not want tracheostomy.  She feels that she would be amenable to a temporary feeding tube, but not a long-term tube.  PMT will continue to follow for goals of care.  She will need time for outcomes.  Spoke with chaplain outside of room who will facilitate H POA document completion.  I completed a MOST form today with patient, and her son [name] who she would like to be her H  POA at bedside.  The signed original was placed in the chart. Each section of options on the form were reviewed in full detail and any questions were answered as needed. The form was scanned and sent to medical records for it to be uploaded under ACP tab in Epic. A photocopy was also placed in the chart to be scanned into EMR. The patient outlined their wishes for the following treatment decisions:  Cardiopulmonary Resuscitation: Do Not Attempt Resuscitation (DNR/No CPR)  Medical Interventions: Full Scope of Treatment: Use intubation, advanced airway interventions, mechanical ventilation, cardioversion as indicated, medical treatment, IV fluids, etc, also provide comfort measures. Transfer to the hospital if indicated   No Tracheostomy.   Antibiotics: Antibiotics if indicated  IV Fluids: IV fluids if indicated  Feeding Tube: Feeding tube for a defined trial period      Length of Stay: 6  Current Medications: Scheduled Meds:   ALPRAZolam  0.25 mg Oral BID   amiodarone  100 mg Oral Daily   ascorbic acid  1,500 mg Oral Daily   escitalopram  10 mg Oral Daily   feeding supplement  237 mL Oral TID BM   fentaNYL  1 patch Transdermal Q72H   ferrous sulfate  325 mg Oral Daily   furosemide  40 mg Intravenous Daily   guaiFENesin  600 mg Oral BID   ipratropium-albuterol  3 mL Nebulization BID   levothyroxine  125 mcg Oral QAC breakfast   magnesium oxide  200 mg Oral Daily   multivitamin  with minerals  1 tablet Oral Daily   omega-3 acid ethyl esters  1 capsule Oral Daily   pantoprazole  40 mg Oral Daily   polyvinyl alcohol  2 drop Both Eyes Daily   predniSONE  40 mg Oral Q breakfast    Continuous Infusions:   PRN Meds: acetaminophen **OR** acetaminophen, albuterol, ALPRAZolam, bismuth subsalicylate, chlorpheniramine-HYDROcodone, diclofenac Sodium, fluticasone, HYDROcodone-acetaminophen, loratadine, magnesium hydroxide, morphine injection, nitroGLYCERIN, olopatadine, ondansetron **OR** ondansetron (ZOFRAN) IV, sodium chloride, traZODone  Physical Exam Pulmonary:     Effort: Pulmonary effort is normal.  Neurological:     Mental Status: She is alert.             Vital Signs: BP 130/81 (BP Location: Right Arm)   Pulse 81   Temp 97.7 F (36.5 C) (Oral)   Resp 16   Ht 5\' 2"  (1.575 m)   Wt 72.4 kg   SpO2 97%   BMI 29.19 kg/m  SpO2: SpO2: 97 % O2 Device: O2 Device: Nasal Cannula O2 Flow Rate: O2 Flow Rate (L/min): 4 L/min  Intake/output summary: No intake or output data in the 24 hours ending 04/27/23 1154 LBM: Last BM Date : 04/26/23 Baseline Weight: Weight: 61.2 kg Most recent weight: Weight: 72.4 kg         Patient Active Problem List   Diagnosis Date Noted   Lymphoma (HCC) 04/26/2023   Palliative care encounter 04/26/2023   Shortness of breath 04/24/2023   Community acquired pneumonia 04/24/2023   Recurrent right pleural effusion 04/21/2023   Acute on chronic respiratory failure with hypoxia (HCC) 04/21/2023   GERD without esophagitis 04/21/2023   Paroxysmal atrial fibrillation (HCC) 04/21/2023   Pneumonia 04/21/2023   Anxiety 04/17/2023   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   Sacral fracture, closed (HCC) 04/15/2023   Fall 04/14/2023   Lumbar compression fracture (HCC) 04/14/2023   Unintentional weight loss 04/13/2023   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  Mood disorder  (HCC) 04/13/2023   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) 04/12/2023   Pleural effusion on right 04/07/2023   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   Chronic kidney disease, stage 3a (HCC) 04/07/2023   Atrial fibrillation, chronic (HCC) 04/07/2023   DVT (deep venous thrombosis) (HCC) 04/07/2023   Leukocytosis 04/07/2023   Right foot pain 04/07/2023   Acute sinusitis 05/23/2022   COPD exacerbation (HCC) 05/22/2022   Atrial fibrillation with RVR (HCC) 05/22/2022   Chest pain 05/22/2022   HTN (hypertension) 05/22/2022   Diabetes mellitus without complication (HCC) 05/22/2022   COPD (chronic obstructive pulmonary disease) (HCC) 05/22/2022   Hypothyroidism 05/22/2022   Iron deficiency anemia 05/22/2022   Osteoarthritis of right knee 03/14/2022    Palliative Care Assessment & Plan     Recommendations/Plan: PMT will continue to follow.  Code Status:    Code Status Orders  (From admission, onward)           Start     Ordered   04/27/23 1154  Do not attempt resuscitation (DNR) Pre-Arrest Interventions Desired  (Code Status)  Continuous       Question Answer Comment  If pulseless and not breathing No CPR or chest compressions.   In Pre-Arrest Conditions (Patient Has Pulse and Is Breathing) May intubate, use advanced airway interventions and cardioversion/ACLS medications if appropriate or indicated. May transfer to ICU.   Consent: Discussion documented in EHR or advanced directives reviewed      04/27/23 1154           Code Status History     Date Active Date Inactive Code Status Order ID Comments User Context   04/21/2023 0354 04/27/2023 1154 Full Code [id number]  Hannah Beat, MD ED   04/14/2023 1255 04/17/2023 2011 Full Code [id number]  Floydene Flock, MD ED   04/07/2023 2014 04/12/2023 1653 Full Code [id number]  Lorretta Harp, MD ED   05/22/2022 1609 05/25/2022 0007 Full Code [id number]  Lorretta Harp, MD  ED   03/14/2022 1433 03/15/2022 1907 Full Code [id number]  Reinaldo Berber, MD Inpatient      Advance Directive Documentation    Flowsheet Row Most Recent Value  Type of Advance Directive Out of facility DNR (pink MOST or yellow form)  Pre-existing out of facility DNR order (yellow form or pink MOST form) Pink MOST/Yellow Form most recent copy in chart - Physician notified to receive inpatient order  "MOST" Form in Place? --       Prognosis: Poor overall    Thank you for allowing the Palliative Medicine Team to assist in the care of this patient.   Morton Stall, NP  Please contact Palliative Medicine Team phone at [phone] for questions and concerns.

## 2023-04-28 ENCOUNTER — Inpatient Hospital Stay: Payer: Medicare (Managed Care) | Admitting: Pulmonary Disease

## 2023-04-28 DIAGNOSIS — J90 Pleural effusion, not elsewhere classified: Secondary | ICD-10-CM | POA: Diagnosis not present

## 2023-04-28 DIAGNOSIS — Z7189 Other specified counseling: Secondary | ICD-10-CM | POA: Diagnosis not present

## 2023-04-28 LAB — CBC
HCT: 30.7 % — ABNORMAL LOW (ref 36.0–46.0)
Hemoglobin: 10.3 g/dL — ABNORMAL LOW (ref 12.0–15.0)
MCH: 30.5 pg (ref 26.0–34.0)
MCHC: 33.6 g/dL (ref 30.0–36.0)
MCV: 90.8 fL (ref 80.0–100.0)
Platelets: 240 10*3/uL (ref 150–400)
RBC: 3.38 MIL/uL — ABNORMAL LOW (ref 3.87–5.11)
RDW: 13.5 % (ref 11.5–15.5)
WBC: 8 10*3/uL (ref 4.0–10.5)
nRBC: 0 % (ref 0.0–0.2)

## 2023-04-28 LAB — BASIC METABOLIC PANEL
Anion gap: 11 (ref 5–15)
BUN: 41 mg/dL — ABNORMAL HIGH (ref 8–23)
CO2: 32 mmol/L (ref 22–32)
Calcium: 8.5 mg/dL — ABNORMAL LOW (ref 8.9–10.3)
Chloride: 92 mmol/L — ABNORMAL LOW (ref 98–111)
Creatinine, Ser: 0.68 mg/dL (ref 0.44–1.00)
GFR, Estimated: >60 mL/min (ref >60)
Glucose, Bld: 123 mg/dL — ABNORMAL HIGH (ref 70–99)
Potassium: 4.4 mmol/L (ref 3.5–5.1)
Sodium: 135 mmol/L (ref 135–145)

## 2023-04-28 LAB — MAGNESIUM: Magnesium: 2.2 mg/dL (ref 1.7–2.4)

## 2023-04-28 MED ORDER — LORATADINE 10 MG PO TABS: 10.0000 mg | ORAL_TABLET | Freq: Every day | ORAL | Status: DC | PRN

## 2023-04-28 MED ORDER — GUAIFENESIN-DM 100-10 MG/5ML PO SYRP: 5.0000 mL | ORAL_SOLUTION | Freq: Four times a day (QID) | ORAL | Status: AC

## 2023-04-28 MED ORDER — ADULT MULTIVITAMIN W/MINERALS CH: 1.0000 | ORAL_TABLET | Freq: Every day | ORAL | Status: DC

## 2023-04-28 MED ORDER — APIXABAN 2.5 MG PO TABS
2.5000 mg | ORAL_TABLET | Freq: Two times a day (BID) | ORAL | Status: DC
  Administered 2023-04-28: 2.5 mg via ORAL
  Filled 2023-04-28: qty 1

## 2023-04-28 MED ORDER — HYDROCODONE-ACETAMINOPHEN 5-325 MG PO TABS: 1.0000 | ORAL_TABLET | Freq: Four times a day (QID) | ORAL | 0 refills | Status: AC | PRN

## 2023-04-28 MED ORDER — ENSURE ENLIVE PO LIQD: 237.0000 mL | Freq: Three times a day (TID) | ORAL | Status: DC

## 2023-04-28 MED ORDER — HYDROCOD POLI-CHLORPHE POLI ER 10-8 MG/5ML PO SUER: 10.0000 mL | Freq: Every day | ORAL | 0 refills | Status: AC

## 2023-04-28 MED ORDER — ESCITALOPRAM OXALATE 10 MG PO TABS: 10.0000 mg | ORAL_TABLET | Freq: Every day | ORAL | Status: DC

## 2023-04-28 MED ORDER — ALPRAZOLAM 0.25 MG PO TABS: 0.2500 mg | ORAL_TABLET | Freq: Two times a day (BID) | ORAL | 0 refills | Status: DC | PRN

## 2023-04-28 MED ORDER — FUROSEMIDE 20 MG PO TABS: 40.0000 mg | ORAL_TABLET | Freq: Every day | ORAL | Status: DC

## 2023-04-28 NOTE — Progress Notes (Signed)
   04/28/23 1300  Spiritual Encounters  Type of Visit Follow up  Care provided to: Patient  Conversation partners present during encounter Nurse  Referral source Nurse (RN/NT/LPN);Physician  Reason for visit Advance directives  OnCall Visit Yes  Spiritual Framework  Presenting Themes Meaning/purpose/sources of inspiration  Patient Stress Factors Major life changes  Interventions  Spiritual Care Interventions Made Established relationship of care and support;Compassionate presence  Intervention Outcomes  Outcomes Reduced anxiety;Reduced fear;Connected to spiritual community  Spiritual Care Plan  Spiritual Care Issues Still Outstanding No further spiritual care needs at this time (see row info)   Advance directive was completed before the patient left the Medical Center. Patient fear of not get it completed was reduce once the paperwork was signed. Let patient know that [employer] are here if they need to talk.

## 2023-04-28 NOTE — Discharge Summary (Signed)
Physician Discharge Summary   Madiline Eustice  female DOB: July 05, 1945  ZOX:[id number]  PCP: [facility]  Admit date: 04/21/2023 Discharge date: 04/28/2023  Admitted From: home Disposition:  SNF rehab CODE STATUS: DNR  Discharge Instructions     Ambulatory Referral to [facility]   Complete by: As directed    Diet - low sodium heart healthy   Complete by: As directed    No wound care   Complete by: As directed       Hospital Course:  For full details, please see H&P, progress notes, consult notes and ancillary notes.  Briefly,  Chasmine Neef is a 77 y.o. female with medical history significant for asthma, COPD on home O2 at baseline of 2 L/min, hypertension, and PAF, who presented to the emergency room with acute onset of worsening dyspnea with associated dry cough with right-sided chest pain with deep breathing.    Recurrent right pleural effusion:  With concern that it's due to B cell lymphoma. --s/p right thoracentesis with 400 ml removed on 11/15 and 1.1 ml removed on 11/21.   --received IV lasix 40 mg daily during hospitalization.  Home oral lasix increased from 20 to 40 mg daily at discharge to help with reducing pleural effusion. --IR declined placement of pleural cath for now.   --outpatient f/u with oncology to monitor for re-accumulation and need for outpatient thoracentesis.   Pneumonia:  --completed 6 days of ceftriaxone, 7 days of azithro   Acute COPD exacerbation Chronic hypoxemic respiratory failure on 2L O2 --dyspnea, wheezing, coughing --started on IV solumedrol, transitioned to prednisone daily.  Received 6 days of steroid burst. --still has bothersome cough which causes chest pain and tightness.  Pt is discharged on Robitussin DM q6h for 5 days and Tussionex nightly for 5 days. --Pt has been requesting 4L O2 with O2 sats around mid to low 90's.  Pt is discharged on 4L O2 (new baseline).     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  Onco consulted with Dr. Cathie Hoops. --bone marrow biopsy on 11/20 --plan for outpatient f/u with thoracic surgery for VATS and pleural biopsy.  Have made request for f/u appointment directly to Dr. Cliffton Asters, thought appointment has yet to be scheduled at the time of discharge. --outpatient f/u with Onc Dr. Cathie Hoops.   Hx of cirrhosis:  As per pt's family.  --received IV lasix 40 mg daily during hospitalization.  Discharged on increased home oral lasix from 20 to 40 mg daily.   Thrombocytopenia:  likely secondary to cirrhosis.    Anxiety:  --Xanax BID PRN --started on Lexapro 10 mg daily.  Can titrate up as needed.   CKDIIIa ruled out CKD 2   Hypothyroidism:  --cont home Synthroid   PAF:  --rate controlled on home amio. --home Lopressor d/c'ed due to intermittent soft BP. --cont home Eliquis   GERD:  continue on PPI   HTN --BP intermittently in 110's without home enalapril and Lopressor, so both d/c'ed. --home oral lasix increased from 20 to 40 mg daily to help with reducing pleural effusion.  Insomnia --Tussionex at night was reportedly helpful with sleep, so is scheduled for nightly for 5 days. --Xanax and trazodone PRN for sleep.  Noncardiac chest pain from coughing --Pt was ordered Fantanyl patch 12 mcg which is not continued at discharge. 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)   Hypothyroidism   Acute on chronic respiratory failure with hypoxia (HCC)   GERD without esophagitis   Paroxysmal atrial fibrillation (HCC)   Pneumonia   Shortness of breath   Community acquired pneumonia   Lymphoma (HCC)   Palliative care encounter   30 Day Unplanned Readmission Risk Score     Flowsheet Row ED to Hosp-Admission (Current) from 04/21/2023 in [facility]  30 Day Unplanned Readmission Risk Score (%) 52.52 Filed at 04/28/2023 0801       This score is the patient's risk of an unplanned readmission within 30 days of being discharged (0 -100%). The score is based on dignosis, age, lab data, medications, orders, and past utilization.   Low:  0-14.9   Medium: 15-21.9   High: 22-29.9   Extreme: 30 and above         Discharge Instructions:  Allergies as of 04/28/2023       Reactions   Cephalosporins Itching   Codeine Other (See Comments)   Extreme drowsiness   Contrast Media [iodinated Contrast Media] Hives, Swelling   Facial swelling   Oxycodone Other (See Comments)   Extreme drowsiness        Medication List     STOP taking these medications    enalapril 20 MG tablet Commonly known as: VASOTEC   metoprolol tartrate 25 MG tablet Commonly known as: LOPRESSOR   potassium chloride 10 MEQ tablet Commonly known as: KLOR-CON   traMADol 50 MG tablet Commonly known as: ULTRAM       TAKE these medications    acetaminophen 325 MG tablet Commonly known as: TYLENOL Take 1,300 mg by mouth every 6 (six) hours as needed for moderate pain.   Advair HFA 115-21 MCG/ACT inhaler Generic drug: fluticasone-salmeterol Inhale 2 puffs into the lungs 2 (two) times daily.   albuterol 108 (90 Base) MCG/ACT inhaler Commonly known as: VENTOLIN HFA Inhale 2-4 puffs by mouth every 4 hours as needed for wheezing, cough, and/or shortness of breath What changed: Another medication with the same name was removed. Continue taking this medication, and follow the directions you see here.   ALPRAZolam 0.25 MG tablet Commonly known as: XANAX Take 1 tablet (0.25 mg total) by mouth 2 (two) times daily as needed for anxiety. What changed:  when to take this reasons to take this   amiodarone 100 MG tablet Commonly known as:  PACERONE Take 1 tablet (100 mg total) by mouth daily.   apixaban 2.5 MG Tabs tablet Commonly known as: ELIQUIS Take 1 tablet (2.5 mg total) by mouth 2 (two) times daily.   bismuth subsalicylate 262 MG/15ML suspension Commonly known as: PEPTO BISMOL Take 30 mLs by mouth every 6 (six) hours as needed for indigestion or diarrhea or loose stools.   chlorpheniramine-HYDROcodone 10-8 MG/5ML Commonly known as: TUSSIONEX Take 10 mLs by mouth at bedtime for 5 days.   escitalopram 10 MG tablet Commonly known as: LEXAPRO Take 1 tablet (10 mg total) by mouth daily.   esomeprazole 40 MG capsule Commonly known as: NEXIUM Take 40 mg by mouth 2 (two) times daily.   eucerin lotion Apply 1 Application topically as needed for dry skin.   feeding supplement Liqd Take 237 mLs by mouth 3 (three) times daily between meals. What changed: when to take this   ferrous sulfate 325 (65 FE) MG  tablet Take 325 mg by mouth daily.   fluticasone 50 MCG/ACT nasal spray Commonly known as: FLONASE Place 2 sprays into both nostrils daily as needed for allergies or rhinitis.   furosemide 20 MG tablet Commonly known as: LASIX Take 2 tablets (40 mg total) by mouth daily. What changed: how much to take   guaiFENesin 600 MG 12 hr tablet Commonly known as: MUCINEX Take 600 mg by mouth 2 (two) times daily as needed for to loosen phlegm or cough.   guaiFENesin-dextromethorphan 100-10 MG/5ML syrup Commonly known as: ROBITUSSIN DM Take 5 mLs by mouth every 6 (six) hours for 5 days.   HYDROcodone-acetaminophen 5-325 MG tablet Commonly known as: NORCO/VICODIN Take 1-2 tablets by mouth every 6 (six) hours as needed for up to 5 days for moderate pain (pain score 4-6) or severe pain (pain score 7-10). What changed: how much to take   ipratropium-albuterol 0.5-2.5 (3) MG/3ML Soln Commonly known as: DUONEB Take 3 mLs by nebulization every 4 (four) hours as needed.   ketoconazole 2 % shampoo Commonly known as:  NIZORAL Apply 1 Application topically 2 (two) times a week.   ketotifen 0.025 % ophthalmic solution Commonly known as: ZADITOR Place 1 drop into both eyes daily as needed.   levothyroxine 125 MCG tablet Commonly known as: SYNTHROID Take 125 mcg by mouth daily before breakfast.   loratadine 10 MG tablet Commonly known as: CLARITIN Take 1 tablet (10 mg total) by mouth daily as needed for allergies, rhinitis or itching. Home med. What changed:  when to take this reasons to take this additional instructions   Magnesium 250 MG Tabs Take 1 tablet by mouth daily.   multivitamin with minerals Tabs tablet Take 1 tablet by mouth daily.   nitroGLYCERIN 0.4 MG SL tablet Commonly known as: NITROSTAT Place 0.4 mg under the tongue every 5 (five) minutes as needed for chest pain.   olopatadine 0.1 % ophthalmic solution Commonly known as: PATANOL Place 1 drop into both eyes daily as needed for allergies.   Omega-3 1000 MG Caps Take 1 capsule by mouth daily.   ondansetron 4 MG tablet Commonly known as: ZOFRAN Take 4 mg by mouth every 8 (eight) hours as needed for nausea or vomiting.   OXYGEN Inhale 2 L into the lungs at bedtime.   Refresh Optive Advanced 0.5-1-0.5 % Soln Generic drug: Carboxymeth-Glycerin-Polysorb Apply 2 drops to eye daily.   rOPINIRole 0.25 MG tablet Commonly known as: REQUIP Take 0.25 mg by mouth at bedtime.   sodium chloride 0.65 % Soln nasal spray Commonly known as: OCEAN Place 1 spray into both nostrils as needed for congestion.   Systane Ultra 0.4-0.3 % Soln Generic drug: Polyethyl Glycol-Propyl Glycol Apply 2 drops to eye QID.   traZODone 50 MG tablet Commonly known as: DESYREL Take 50 mg by mouth at bedtime as needed for sleep.   TURMERIC PO Take 500 mg by mouth daily.   VITAMIN C CR 1500 MG Tbcr Take 1 tablet by mouth daily.   VITAMIN D-VITAMIN K PO Take 45 mcg by mouth daily.   Voltaren 1 % Gel Generic drug: diclofenac Sodium Apply 2  g topically 4 (four) times daily as needed.   Zinc Oxide 10 % Oint Apply 1 application  topically 3 (three) times daily as needed.         Contact information for follow-up providers     Corliss Skains, MD Follow up in 1 week(s).   Specialty: Cardiothoracic Surgery Contact information: [address]  Gabrielle Dare [address] [place] [address] [phone]              Contact information for after-discharge care     Destination     [facility] [place] .   Service: Skilled Nursing Contact information: [address] [place] [address] [phone]                     Allergies  Allergen Reactions   Cephalosporins Itching   Codeine Other (See Comments)    Extreme drowsiness   Contrast Media [Iodinated Contrast Media] Hives and Swelling    Facial swelling   Oxycodone Other (See Comments)    Extreme drowsiness     The results of significant diagnostics from this hospitalization (including imaging, microbiology, ancillary and laboratory) are listed below for reference.   Consultations:   Procedures/Studies: DG Chest Port 1 View  Result Date: 04/27/2023 CLINICAL DATA:  Right pleural effusion, status post thoracentesis EXAM: PORTABLE CHEST 1 VIEW COMPARISON:  04/27/2023 12:46 p.m. FINDINGS: Substantially reduced size of the right pleural effusion. No pneumothorax. There is some residual indistinct airspace opacity at the right lung base probably from atelectasis, along with some residual blunting of the right lateral costophrenic angle. Loop recorder and left shoulder reverse arthroplasty again noted. Bony demineralization. Old left rib fractures. IMPRESSION: 1. Substantially reduced size of the right pleural effusion, without pneumothorax. 2. Residual indistinct airspace opacity at the right lung base probably from atelectasis. Blunting the right lateral costophrenic angle indicating some degree of residual pleural effusion. 3. Bony  demineralization. Electronically Signed   By: Gaylyn Rong M.D.   On: 04/27/2023 17:07   DG Chest 2 View  Result Date: 04/27/2023 CLINICAL DATA:  Pleural effusions EXAM: CHEST - 2 VIEW COMPARISON:  04/22/2023 FINDINGS: Normal cardiac silhouette. Moderate RIGHT effusion improved slightly from comparison exam. No pneumothorax. LEFT lung clear. LEFT shoulder arthroplasty IMPRESSION: Slight improvement in moderate RIGHT effusion. Electronically Signed   By: Genevive Bi M.D.   On: 04/27/2023 16:34   US THORACENTESIS ASP PLEURAL SPACE W/IMG GUIDE  Result Date: 04/27/2023 INDICATION: Patient with history of COPD, lymphoma with recurrent symptomatic right pleural effusion previous thoracentesis have been helpful with improvement in breathing. Request received for therapeutic right thoracentesis. EXAM: ULTRASOUND GUIDED RIGHT THORACENTESIS MEDICATIONS: Local 1% lidocaine only. COMPLICATIONS: None immediate. PROCEDURE: An ultrasound guided thoracentesis was thoroughly discussed with the patient and questions answered. The benefits, risks, alternatives and complications were also discussed. The patient understands and wishes to proceed with the procedure. Written consent was obtained. Ultrasound was performed to localize and mark an adequate pocket of fluid in the right chest. The area was then prepped and draped in the normal sterile fashion. 1% Lidocaine was used for local anesthesia. Under ultrasound guidance a 19 gauge, 7-cm, Yueh catheter was introduced. Thoracentesis was performed. The catheter was removed and a dressing applied. FINDINGS: A total of approximately 1.1 L of amber/bloody fluid was removed. Samples were sent to the laboratory as requested by the clinical team. IMPRESSION: Successful ultrasound guided right thoracentesis yielding 1.1 L of pleural fluid. This exam was performed by Pattricia Boss PA-C, and was supervised and interpreted by Dr. Fredia Sorrow. Electronically Signed   By: Irish Lack M.D.   On: 04/27/2023 15:43   IR BONE MARROW BIOPSY & ASPIRATION  Result Date: 04/26/2023 INDICATION: Lymphoma EXAM: Bone marrow aspiration and core biopsy using fluoroscopic guidance MEDICATIONS: None. ANESTHESIA/SEDATION: Moderate (conscious) sedation was employed during this procedure. A  total of Versed 1.5 mg and Fentanyl 50 mcg was administered intravenously. Moderate Sedation Time: 23 minutes. The patient's level of consciousness and vital signs were monitored continuously by radiology nursing throughout the procedure under my direct supervision. FLUOROSCOPY TIME:  Fluoroscopy Time: 0.8 minutes (11 mGy) COMPLICATIONS: None immediate. PROCEDURE: Informed written consent was obtained from the patient after a thorough discussion of the procedural risks, benefits and alternatives. All questions were addressed. Maximal Sterile Barrier Technique was utilized including caps, mask, sterile gowns, sterile gloves, sterile drape, hand hygiene and skin antiseptic. A timeout was performed prior to the initiation of the procedure. The patient was placed prone on the exam table. Limited fluoroscopy of the pelvis was performed for planning purposes. Skin entry site was marked, and the overlying skin was prepped and draped in the standard sterile fashion. Local analgesia was obtained with 1% lidocaine. Using fluoroscopic guidance, an 11 gauge needle was advanced just deep to the cortex of the right posterior ilium. Subsequently, bone marrow aspiration and core biopsy were performed. Specimens were submitted to lab/pathology for handling. Hemostasis was achieved with manual pressure, and a clean dressing was placed. The patient tolerated the procedure well without immediate complication. IMPRESSION: Successful bone marrow aspiration and core biopsy of the right posterior ilium using fluoroscopic guidance. Electronically Signed   By: Olive Bass M.D.   On: 04/26/2023 12:17   NM PET Image Initial (PI) Skull  Base To Thigh  Result Date: 04/24/2023 CLINICAL DATA:  Initial treatment strategy for weight loss, monoclonal B-cell lymphocytosis, and right pleural effusion. EXAM: NUCLEAR MEDICINE PET SKULL BASE TO THIGH TECHNIQUE: 7.0 mCi F-18 FDG was injected intravenously. Full-ring PET imaging was performed from the skull base to thigh after the radiotracer. CT data was obtained and used for attenuation correction and anatomic localization. Fasting blood glucose: 119 mg/dl COMPARISON:  CT chest abdomen pelvis dated 04/14/2023 FINDINGS: Mediastinal blood pool activity: SUV max 2.6 Liver activity: SUV max NA NECK: No hypermetabolic lymph nodes in the neck. Incidental CT findings: None. CHEST: No hypermetabolic mediastinal or hilar nodes. No suspicious pulmonary nodules on the CT scan. Moderate right pleural effusion without convincing nodularity or hypermetabolism. Right middle and right lower lobe compressive atelectasis. Incidental CT findings: Atherosclerotic calcifications of the aortic arch. Mitral valve annular calcifications. Mild coronary atherosclerosis of the LAD. ABDOMEN/PELVIS: No abnormal hypermetabolic activity within the liver, pancreas, adrenal glands, or spleen. No hypermetabolic lymph nodes in the abdomen or pelvis. Nodular hepatic contour, suggesting cirrhosis. Mild diffuse hypermetabolism in the gastric antrum, max SUV 5.6, favored to be reactive or less likely reflecting gastritis. However, there is mild polypoid soft tissue prominence at the pylorus on CT (series 6/image 88), equivocal. Consider direct visualization as clinically warranted. Incidental CT findings: Status post cholecystectomy. Atherosclerotic calcifications of the abdominal aorta and branch vessels. Left extrarenal pelvis. Sigmoid diverticulosis, without evidence of diverticulitis. SKELETON: No focal hypermetabolic activity to suggest skeletal metastasis. Incidental CT findings: Status post left shoulder arthroplasty with posttraumatic  deformity involving the distal humerus. Degenerative changes of the visualized thoracolumbar spine. IMPRESSION: No findings specific for malignancy. No suspicious lymphadenopathy in this patient with B-cell lymphocytosis. 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 Possible distal gastritis. Equivocal polypoid soft tissue thickening at the pylorus, consider upper endoscopy for direct visualization as clinically warranted. Electronically Signed   By: Charline Bills M.D.   On: 04/24/2023 10:06   DG Chest Port 1 View  Result Date: 04/22/2023 CLINICAL DATA:  Shortness of breath EXAM: PORTABLE CHEST 1  VIEW COMPARISON:  04/21/2023 FINDINGS: Large right pleural effusion with right lower lobe atelectasis or infiltrate, similar to prior study. No confluent opacity on the left. Heart and mediastinal contours within normal limits. Aortic atherosclerosis. No acute bony abnormality. IMPRESSION: Large right pleural effusion with right lower lobe atelectasis or infiltrate, unchanged. Electronically Signed   By: Charlett Nose M.D.   On: 04/22/2023 19:19   DG Chest Port 1 View  Result Date: 04/21/2023 CLINICAL DATA:  Pleural effusion status post thoracentesis. EXAM: PORTABLE CHEST 1 VIEW COMPARISON:  04/20/2023. FINDINGS: Cardiac silhouette is obscured. Large right-sided pleural effusion. Left lung clear. Old healed adjacent left-sided posterior fifth and sixth rib fractures. Calcified aorta. Thoracic degenerative changes. No pneumothorax. IMPRESSION: Large right-sided pleural effusion. Electronically Signed   By: Layla Maw M.D.   On: 04/21/2023 18:16   US THORACENTESIS ASP PLEURAL SPACE W/IMG GUIDE  Result Date: 04/21/2023 INDICATION: Patient with history of COPD, lymphoma and recurrent right pleural effusion. Request for diagnostic and therapeutic right thoracentesis EXAM: ULTRASOUND GUIDED RIGHT THORACENTESIS MEDICATIONS: 8 mL 1% lidocaine COMPLICATIONS: None immediate. PROCEDURE: An  ultrasound guided thoracentesis was thoroughly discussed with the patient and questions answered. The benefits, risks, alternatives and complications were also discussed. The patient understands and wishes to proceed with the procedure. Written consent was obtained. Ultrasound was performed to localize and mark an adequate pocket of fluid in the right chest. Pleural effusion noted to be mildly loculated and the largest pocket was selected for drainage. The area was then prepped and draped in the normal sterile fashion. 1% Lidocaine was used for local anesthesia. Under ultrasound guidance a 6 Fr Safe-T-Centesis catheter was introduced. Thoracentesis was performed. The catheter was removed and a dressing applied. FINDINGS: A total of approximately 400 mL of serosanguineous fluid was removed. Samples were sent to the laboratory as requested by the clinical team. IMPRESSION: Successful ultrasound guided right thoracentesis yielding 400 mL of pleural fluid. Performed by Lynnette Caffey, PA-C Electronically Signed   By: Irish Lack M.D.   On: 04/21/2023 15:52   DG Chest Port 1 View  Result Date: 04/20/2023 CLINICAL DATA:  Shortness of breath EXAM: PORTABLE CHEST 1 VIEW COMPARISON:  Radiographs 04/12/2023 and PET/CT 04/19/2023 FINDINGS: Moderate-large right pleural effusion and associated airspace opacities similar to PET/CT 04/19/2023 given differences in technique. The left lung is clear. Stable cardiomediastinal silhouette. Aortic atherosclerotic calcification. Loop recorder. Left reverse TSA. IMPRESSION: Moderate-large right pleural effusion and associated airspace opacities. Electronically Signed   By: Minerva Fester M.D.   On: 04/20/2023 22:38   CT L-SPINE NO CHARGE  Result Date: 04/14/2023 CLINICAL DATA:  77 year old female fell, found down. On blood thinners. Head hematoma. EXAM: CT LUMBAR SPINE WITHOUT CONTRAST TECHNIQUE: Technique: Multiplanar CT images of the lumbar spine were reconstructed from  contemporary CT of the Abdomen and Pelvis. RADIATION DOSE REDUCTION: This exam was performed according to the departmental dose-optimization program which includes automated exposure control, adjustment of the mA and/or kV according to patient size and/or use of iterative reconstruction technique. CONTRAST:  None COMPARISON:  CT thoracic, CT Chest, Abdomen, and Pelvis today reported separately. FINDINGS: Segmentation: Normal. Alignment: Mild dextroconvex upper and levoconvex lower lumbar scoliosis. Straightening of lordosis. No significant spondylolisthesis. Vertebrae: Diffuse osteopenia. L1, L2, and L3 vertebrae appear intact. Mild L4 superior endplate compression (sagittal image 64, it is eccentric to the right (coronal image 48). L4 vertebra otherwise intact. L5 vertebra appears intact. Superior sacrum and SI joints appear intact. But there is a subtle, minimally displaced  fracture of the lower sacrum in the midline S5 (sagittal image 62). Paraspinal and other soft tissues: Abdomen and pelvis are detailed separately. Lumbar paraspinal soft tissues are within normal limits. Disc levels: Lumbar spine degeneration with multilevel vacuum disc. Moderate to severe disc space loss with disc osteophyte disease L2-L3 through L3-L4. Estimated lumbar spinal stenosis by CT as follows L2-L3: Mild to moderate, and with lateral recess stenosis greater on the left. L3-L4:  Mild-to-moderate. L4-L5:  Moderate to severe (series 2, image 82). IMPRESSION: 1. Osteopenia. Positive for a minimally displaced acute fracture of the lower sacrum at S5. And a mild L4 superior endplate compression fracture is age indeterminate. 2. Advanced lumbar spine disc and endplate degeneration contributing to multilevel spinal stenosis which is moderate to severe at L4-L5 and mild to moderate at L2-L3 and L3-L4. 3. CT Abdomen and Pelvis today reported separately. Electronically Signed   By: Odessa Fleming M.D.   On: 04/14/2023 07:19   CT T-SPINE NO  CHARGE  Result Date: 04/14/2023 CLINICAL DATA:  77 year old female fell, found down. On blood thinners. Head hematoma. EXAM: CT THORACIC SPINE WITHOUT CONTRAST TECHNIQUE: Multiplanar CT images of the thoracic spine were reconstructed from contemporary CT of the Chest. RADIATION DOSE REDUCTION: This exam was performed according to the departmental dose-optimization program which includes automated exposure control, adjustment of the mA and/or kV according to patient size and/or use of iterative reconstruction technique. CONTRAST:  None COMPARISON:  CT cervical spine, CT Chest, Abdomen, and Pelvis today reported separately. Recent chest CT 04/07/2023. FINDINGS: Limited cervical spine imaging:  Reported separately. Thoracic spine segmentation: Appears normal; hypoplastic left 12th rib. Alignment: Stable thoracic kyphosis from earlier this month. No significant scoliosis or spondylolisthesis. Vertebrae: Chronic osteopenia. Subtle loss of height or compression at T5, T6, T11 is unchanged from earlier this month. No acute thoracic vertebral fracture identified. Visible posterior ribs appear intact. Paraspinal and other soft tissues: Chest and abdomen are detailed separately. Thoracic paraspinal soft tissues remain within normal limits. Disc levels: Thoracic spine degeneration although mostly anterior flowing endplate osteophytes. Partially calcified disc and/or endplate spurring at T10-T11 with evidence of mild chronic thoracic spinal stenosis there. Superimposed facet hypertrophy. Spinal stenosis could be mild or moderate. IMPRESSION: 1. Stable CT appearance of the Thoracic Spine. Osteopenia, No acute traumatic injury identified. 2. 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 3. CT Chest, Abdomen, and Pelvis today reported separately. Electronically Signed   By: Odessa Fleming M.D.   On: 04/14/2023 07:14   CT CHEST ABDOMEN PELVIS WO CONTRAST  Result Date: 04/14/2023 CLINICAL DATA:  77 year old  female fell, found down. On blood thinners. Hematoma. EXAM: CT CHEST, ABDOMEN AND PELVIS WITHOUT CONTRAST TECHNIQUE: Multidetector CT imaging of the chest, abdomen and pelvis was performed following the standard protocol without IV contrast. RADIATION DOSE REDUCTION: This exam was performed according to the departmental dose-optimization program which includes automated exposure control, adjustment of the mA and/or kV according to patient size and/or use of iterative reconstruction technique. COMPARISON:  Thoracic and lumbar CT today reported separately. Recent chest CT 04/07/2023. FINDINGS: CT CHEST FINDINGS Cardiovascular: Calcified aortic atherosclerosis. Calcified coronary artery atherosclerosis. Stable noncontrast appearance of the heart and aorta. No cardiomegaly or pericardial effusion. Mediastinum/Nodes: Stable noncontrast mediastinum. No evidence of mass, lymphadenopathy, mediastinal hematoma. Lungs/Pleura: Layering right pleural effusion with simple fluid density remains moderate to large, slightly decreased compared to 04/07/2023. No contralateral left pleural effusion. Major airways remain patent. Compressive right lung atelectasis. Right lower lobe ventilation has mildly improved. No  pneumothorax. Musculoskeletal: Left shoulder arthroplasty. Chronic posttraumatic and/or degenerative changes at the right shoulder. Chronic left rib fractures. Thoracic spine is reported separately. No sternal fracture. CT ABDOMEN PELVIS FINDINGS Hepatobiliary: Nodular liver contour suggestive of cirrhosis (series 2, image 69). No discrete liver lesion in the absence of IV contrast. Cholecystectomy. Pancreas: Negative. Spleen: Negative, no splenomegaly.  No perisplenic fluid. Adrenals/Urinary Tract: Negative noncontrast adrenal glands, kidneys, bladder. Pelvic phleboliths. Stomach/Bowel: Postoperative changes to the right ventral abdominal wall, probably chronic cholecystectomy related. Small fat containing umbilical hernia  series 2, image 83. Nearby ventral abdominal wall subcutaneous fat stranding which is probably subcutaneous injection sites (series 2, image 93). No herniated bowel. No dilated large or small bowel. Severe diverticulosis of the sigmoid colon in the pelvis. No active large bowel inflammation. Appendicoliths on series 2, image 93, but otherwise normal appendix (coronal image 44). Decompressed terminal ileum. Decompressed stomach. No free air or free fluid. No mesenteric inflammation identified. Vascular/Lymphatic: Extensive Aortoiliac calcified atherosclerosis. Vascular patency is not evaluated in the absence of IV contrast. Normal caliber abdominal aorta. No lymphadenopathy identified. Reproductive: Retroverted, otherwise negative noncontrast appearance. Other: No pelvis free fluid. Musculoskeletal: Lumbar spine is detailed separately. Sacrum, SI joints, pelvis, and proximal femurs appear intact. No superficial soft tissue injury identified. IMPRESSION: 1. No acute traumatic injury identified in the noncontrast chest, abdomen, or pelvis. See CT thoracic and lumbar spine reported separately. 2. Moderate to large layering right pleural effusion has slightly decreased compared to 04/07/2023. Simple fluid density favors transudate (see #3). Compressive right lung atelectasis. 3. Nodular liver contour strongly suggests Cirrhosis. But no ascites, no splenomegaly. 4.  Aortic Atherosclerosis (ICD10-I70.0). Electronically Signed   By: Odessa Fleming M.D.   On: 04/14/2023 07:09   CT Cervical Spine Wo Contrast  Result Date: 04/14/2023 CLINICAL DATA:  77 year old female fell, found down. On blood thinners. Hematoma. EXAM: CT CERVICAL SPINE WITHOUT CONTRAST TECHNIQUE: Multidetector CT imaging of the cervical spine was performed without intravenous contrast. Multiplanar CT image reconstructions were also generated. RADIATION DOSE REDUCTION: This exam was performed according to the departmental dose-optimization program which includes  automated exposure control, adjustment of the mA and/or kV according to patient size and/or use of iterative reconstruction technique. COMPARISON:  Head CT today.  Cervical spine CT 03/05/2023. FINDINGS: Alignment: Straightening of cervical lordosis. Cervicothoracic junction alignment is within normal limits. Bilateral posterior element alignment is within normal limits. Skull base and vertebrae: Visualized skull base is intact. No atlanto-occipital dissociation. C1 and C2 appear intact and aligned. No acute osseous abnormality identified. Soft tissues and spinal canal: No prevertebral fluid or swelling. No visible canal hematoma. Partially retropharyngeal carotids. Calcified atherosclerosis. Mild pharynx motion artifact. Disc levels: Stable cervical spine degeneration. Multilevel disc and endplate degeneration with some ligamentous hypertrophy. 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 Upper chest: Thoracic spine and chest CT today reported separately. IMPRESSION: 1. No acute traumatic injury identified in the cervical spine. 2. 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 Electronically Signed   By: Odessa Fleming M.D.   On: 04/14/2023 06:59   CT HEAD WO CONTRAST ( )  Result Date: 04/14/2023 CLINICAL DATA:  77 year old female fell, found down. On blood thinners. Head hematoma. EXAM: CT HEAD WITHOUT CONTRAST TECHNIQUE: Contiguous axial images were obtained from the base of the skull through the vertex without intravenous contrast. RADIATION DOSE REDUCTION: This exam was performed according to the departmental dose-optimization program which includes automated exposure control, adjustment of the mA and/or kV according to patient size  and/or use of iterative reconstruction technique. COMPARISON:  Head CT 03/05/2023. FINDINGS: Brain: Cerebral volume is stable, within normal limits for age. No midline shift, ventriculomegaly, mass effect, evidence of mass lesion,  intracranial hemorrhage or evidence of cortically based acute infarction. Stable mild for age patchy white matter hypodensity. Otherwise normal gray-white differentiation. Vascular: Calcified atherosclerosis at the skull base. No suspicious intracranial vascular hyperdensity. Skull: Stable and intact. Sinuses/Orbits: Visualized paranasal sinuses and mastoids are stable and well aerated. Other: There is a mild right posterior convexity scalp hematoma or contusion on series 3, image 35. Underlying calvarium appears intact. No other acute orbit or scalp soft tissue injury identified. IMPRESSION: 1. Right posterior convexity scalp hematoma or contusion. No skull fracture. 2. No acute intracranial abnormality. Stable and largely negative for age non contrast CT appearance of the brain. Electronically Signed   By: Odessa Fleming M.D.   On: 04/14/2023 06:55   DG Chest Port 1 View  Result Date: 04/12/2023 CLINICAL DATA:  Cough, shortness of breath EXAM: PORTABLE CHEST 1 VIEW COMPARISON:  04/11/2023 FINDINGS: Heart size is normal. Dense mitral annulus calcifications. Benign calcified granulomatous mediastinal lymph nodes. Implantable loop recorder. No acute airspace opacity. Benign calcified nodule specific status post left shoulder reverse arthroplasty. IMPRESSION: No acute abnormality of the lungs in AP portable projection. Electronically Signed   By: Jearld Lesch M.D.   On: 04/12/2023 09:10   DG Chest Port 1 View  Result Date: 04/11/2023 CLINICAL DATA:  Pleural effusion. EXAM: PORTABLE CHEST 1 VIEW COMPARISON:  April 10, 2023. FINDINGS: The heart size and mediastinal contours are within normal limits. Status post left shoulder arthroplasty. Old left rib fractures are noted. Increased right basilar opacity is noted concerning for worsening effusion and associated atelectasis or infiltrate. Left lung is unremarkable. IMPRESSION: Increased right basilar opacity is noted concerning for worsening effusion and associated  atelectasis or infiltrate. Electronically Signed   By: Lupita Raider M.D.   On: 04/11/2023 08:51   ECHOCARDIOGRAM COMPLETE  Result Date: 04/10/2023    ECHOCARDIOGRAM REPORT   Patient Name:   GEARLENE DEASY Date of Exam: 04/09/2023 Medical Rec #:  [id number]      Height:       62.0 in Accession #:    [id number]     Weight:       141.5 lb Date of Birth:  20-Sep-1945       BSA:          1.650 m Patient Age:    77 years       BP:           121/76 mmHg Patient Gender: F              HR:           58 bpm. Exam Location:  [facility] Procedure: 2D Echo, Cardiac Doppler and Color Doppler Indications:    DOE R06.0  History:        Patient has prior history of Echocardiogram examinations, most                 recent 05/24/2022. COPD, Arrythmias:Atrial Fibrillation; Risk                 Factors:Hypertension and Diabetes.  Sonographer:    Dondra Prader RVT RCS Referring Phys: [id number] Sunnie Nielsen  Sonographer Comments: Patient unable to tolerate probe pressure on abdomen; no subcostal views. IMPRESSIONS  1. Left ventricular ejection fraction, by estimation, is 55 to 60%. The left ventricle has  normal function. The left ventricle has no regional wall motion abnormalities. Left ventricular diastolic parameters are consistent with Grade I diastolic dysfunction (impaired relaxation).  2. Right ventricular systolic function is normal. The right ventricular size is normal. There is normal pulmonary artery systolic pressure.  3. Left atrial size was mildly dilated.  4. The mitral valve is normal in structure. Mild mitral valve regurgitation. Mild to moderate mitral stenosis. The mean mitral valve gradient is 6.0 mmHg. Severe mitral annular calcification.  5. The aortic valve is normal in structure. Aortic valve regurgitation is trivial. No aortic stenosis is present.  6. The inferior vena cava is normal in size with greater than 50% respiratory variability, suggesting right atrial pressure of 3 mmHg. FINDINGS  Left Ventricle: Left  ventricular ejection fraction, by estimation, is 55 to 60%. The left ventricle has normal function. The left ventricle has no regional wall motion abnormalities. The left ventricular internal cavity size was normal in size. There is  no left ventricular hypertrophy. Left ventricular diastolic parameters are consistent with Grade I diastolic dysfunction (impaired relaxation). Right Ventricle: The right ventricular size is normal. No increase in right ventricular wall thickness. Right ventricular systolic function is normal. There is normal pulmonary artery systolic pressure. The tricuspid regurgitant velocity is 2.31 m/s, and  with an assumed right atrial pressure of 3 mmHg, the estimated right ventricular systolic pressure is 24.3 mmHg. Left Atrium: Left atrial size was mildly dilated. Right Atrium: Right atrial size was normal in size. Pericardium: There is no evidence of pericardial effusion. Mitral Valve: The mitral valve is normal in structure. There is moderate thickening of the mitral valve leaflet(s). There is moderate calcification of the mitral valve leaflet(s). Severe mitral annular calcification. Mild mitral valve regurgitation. Mild  to moderate mitral valve stenosis. The mean mitral valve gradient is 6.0 mmHg. Tricuspid Valve: The tricuspid valve is normal in structure. Tricuspid valve regurgitation is mild . No evidence of tricuspid stenosis. Aortic Valve: The aortic valve is normal in structure. Aortic valve regurgitation is trivial. Aortic regurgitation PHT measures 600 msec. No aortic stenosis is present. Aortic valve mean gradient measures 7.0 mmHg. Aortic valve peak gradient measures 12.7 mmHg. Aortic valve area, by VTI measures 1.18 cm. Pulmonic Valve: The pulmonic valve was normal in structure. Pulmonic valve regurgitation is not visualized. No evidence of pulmonic stenosis. Aorta: The aortic root is normal in size and structure. Venous: The inferior vena cava is normal in size with greater  than 50% respiratory variability, suggesting right atrial pressure of 3 mmHg. IAS/Shunts: No atrial level shunt detected by color flow Doppler.  LEFT VENTRICLE PLAX 2D LVIDd:         4.30 cm   Diastology LVIDs:         3.10 cm   LV e' medial:    4.56 cm/s LV PW:         1.00 cm   LV E/e' medial:  24.6 LV IVS:        0.80 cm   LV e' lateral:   4.87 cm/s LVOT diam:     1.40 cm   LV E/e' lateral: 23.0 LV SV:         43 LV SV Index:   26 LVOT Area:     1.54 cm  RIGHT VENTRICLE RV Basal diam:  2.60 cm RV S prime:     11.30 cm/s TAPSE (M-mode): 1.7 cm LEFT ATRIUM             Index  RIGHT ATRIUM          Index LA diam:        4.40 cm 2.67 cm/m   RA Area:     7.81 cm LA Vol (A2C):   50.9 ml 30.84 ml/m  RA Volume:   13.80 ml 8.36 ml/m LA Vol (A4C):   42.4 ml 25.69 ml/m LA Biplane Vol: 49.3 ml 29.87 ml/m  AORTIC VALVE                     PULMONIC VALVE AV Area (Vmax):    1.37 cm      PV Vmax:       1.06 m/s AV Area (Vmean):   1.26 cm      PV Peak grad:  4.5 mmHg AV Area (VTI):     1.18 cm AV Vmax:           178.00 cm/s AV Vmean:          116.000 cm/s AV VTI:            0.369 m AV Peak Grad:      12.7 mmHg AV Mean Grad:      7.0 mmHg LVOT Vmax:         158.00 cm/s LVOT Vmean:        94.800 cm/s LVOT VTI:          0.282 m LVOT/AV VTI ratio: 0.76 AI PHT:            600 msec  AORTA Ao Root diam: 2.80 cm Ao Asc diam:  3.60 cm MITRAL VALVE                TRICUSPID VALVE MV Area (PHT): 2.07 cm     TR Peak grad:   21.3 mmHg MV Mean grad:  6.0 mmHg     TR Vmax:        231.00 cm/s MV Decel Time: 366 msec MR Peak grad: 11.8 mmHg     SHUNTS MR Mean grad: 6.0 mmHg      Systemic VTI:  0.28 m MR Vmax:      172.00 cm/s   Systemic Diam: 1.40 cm MR Vmean:     116.0 cm/s MV E velocity: 112.00 cm/s MV A velocity: 167.00 cm/s MV E/A ratio:  0.67 Lorine Bears MD Electronically signed by Lorine Bears MD Signature Date/Time: 04/10/2023/5:17:03 PM    Final    DG Chest Port 1 View  Result Date: 04/10/2023 CLINICAL DATA:  142230  Pleural effusion 142230. EXAM: PORTABLE CHEST 1 VIEW COMPARISON:  Chest radiograph 04/09/2023. FINDINGS: Decreased, trace residual right pleural effusion. Increasing airspace opacity in the medial aspect of the right lung base, suspicious for aspiration or developing infection. No pneumothorax. IMPRESSION: Decreased, trace residual right pleural effusion. Increasing airspace opacity in the medial aspect of the right lung base, suspicious for aspiration or developing infection. Electronically Signed   By: Orvan Falconer M.D.   On: 04/10/2023 12:47   DG Chest Port 1 View  Result Date: 04/09/2023 CLINICAL DATA:  Post thoracentesis EXAM: PORTABLE CHEST 1 VIEW COMPARISON:  CT chest 04/07/2023, chest x-ray 05/22/2022 FINDINGS: Left-sided electronic recording device. Normal cardiac size. Mitral annular calcifications. Left lung grossly clear. Decreased right pleural effusion with small moderate residual pleural effusion. No convincing right pneumothorax. Airspace disease at the right base. Aortic atherosclerosis. Old appearing left-sided rib fractures. Left shoulder replacement IMPRESSION: Decreased right pleural effusion with small to moderate residual pleural effusion. No convincing pneumothorax.  Airspace disease at the right base, atelectasis versus pneumonia. Electronically Signed   By: Jasmine Pang M.D.   On: 04/09/2023 16:27   US Abdomen Limited RUQ (LIVER/GB)  Result Date: 04/09/2023 CLINICAL DATA:  Cirrhosis EXAM: ULTRASOUND ABDOMEN LIMITED RIGHT UPPER QUADRANT COMPARISON:  None Available. FINDINGS: Gallbladder: Prior cholecystectomy. Common bile duct: Diameter: 6 mm. Liver: No focal lesion identified. Nodular liver contour. Normal parenchymal echogenicity. Portal vein is patent on color Doppler imaging with normal direction of blood flow towards the liver. Other: Right pleural effusion. IMPRESSION: 1. Cirrhotic liver morphology. 2. Right pleural effusion. Electronically Signed   By: Allegra Macario Shear M.D.    On: 04/09/2023 12:59   DG Foot Complete Right  Result Date: 04/08/2023 CLINICAL DATA:  Right foot pain EXAM: RIGHT FOOT COMPLETE - 3+ VIEW COMPARISON:  None Available. FINDINGS: No fracture or dislocation is seen. Mild degenerative changes of the 1st MCP joint. Degenerative changes of the dorsal midfoot. Moderate posterior and plantar enthesophytes. IMPRESSION: No fracture or dislocation is seen. Degenerative changes, as above. Electronically Signed   By: Charline Bills M.D.   On: 04/08/2023 01:55   CT CHEST WO CONTRAST  Result Date: 04/07/2023 CLINICAL DATA:  Chest pain, shortness of breath EXAM: CT CHEST WITHOUT CONTRAST TECHNIQUE: Multidetector CT imaging of the chest was performed following the standard protocol without IV contrast. RADIATION DOSE REDUCTION: This exam was performed according to the departmental dose-optimization program which includes automated exposure control, adjustment of the mA and/or kV according to patient size and/or use of iterative reconstruction technique. COMPARISON:  CT 05/23/2022 FINDINGS: Cardiovascular: Heart size is upper limits of normal. No pericardial effusion. Thoracic aorta is nonaneurysmal. There are atherosclerotic vascular calcifications of the aorta and coronary arteries. Calcification of the mitral annulus. Central pulmonary vasculature is nondilated. Mediastinum/Nodes: No enlarged mediastinal or axillary lymph nodes. Thyroid gland, trachea, and esophagus demonstrate no significant findings. Small hiatal hernia. Lungs/Pleura: Moderate-large layering right-sided pleural effusion with complete atelectasis of the right lower lobe and near-complete atelectasis of the right middle lobe. Partial compressive atelectasis of the right upper lobe. Mild mosaic attenuation of the aerated portion of the right upper lobe and within the left lung. No left-sided pleural effusion. No pneumothorax. Upper Abdomen: Nodular hepatic surface contour. No acute abnormality.  Musculoskeletal: No chest wall mass or suspicious bone lesions identified. Left chest wall loop recorder. IMPRESSION: 1. Moderate-large layering right-sided pleural effusion with complete atelectasis of the right lower lobe and near-complete atelectasis of the right middle lobe. Partial compressive atelectasis of the right upper lobe. 2. Mild mosaic attenuation of the aerated portion of the right upper lobe and within the left lung, which may be related to small airways disease. 3. Nodular hepatic surface contour, suggestive of cirrhosis. 4. Aortic and coronary artery atherosclerosis (ICD10-I70.0). Electronically Signed   By: Duanne Guess D.O.   On: 04/07/2023 15:25      Labs: BNP (last 3 results) Recent Labs    05/22/22 1010  BNP 212.9*   Basic Metabolic Panel: Recent Labs  Lab 04/23/23 0520 04/24/23 0434 04/25/23 0426 04/26/23 0437 04/28/23 0445  NA 136 135 133* 137 135  K 3.8 4.1 4.2 4.9 4.4  CL 98 96* 95* 97* 92*  CO2 27 30 29 31  32  GLUCOSE 119* 200* 253* 151* 123*  BUN 11 17 25* 37* 41*  CREATININE 0.61 0.79 0.74 0.76 0.68  CALCIUM 8.2* 8.4* 8.3* 8.8* 8.5*  MG  --   --  2.2 2.5* 2.2  PHOS  --   --  3.1 3.5  --    Liver Function Tests: No results for input(s): "AST", "ALT", "ALKPHOS", "BILITOT", "PROT", "ALBUMIN" in the last 168 hours. No results for input(s): "LIPASE", "AMYLASE" in the last 168 hours. No results for input(s): "AMMONIA" in the last 168 hours. CBC: Recent Labs  Lab 04/23/23 0520 04/24/23 0434 04/25/23 0426 04/26/23 0437 04/28/23 0445  WBC 5.7 7.5 10.5 13.1* 8.0  NEUTROABS  --   --  9.6* 11.5*  --   HGB 10.6* 10.2* 9.9* 10.1* 10.3*  HCT 31.8* 31.1* 30.0* 30.4* 30.7*  MCV 91.6 92.8 92.9 91.6 90.8  PLT 134* 163 187 241 240   Cardiac Enzymes: No results for input(s): "CKTOTAL", "CKMB", "CKMBINDEX", "TROPONINI" in the last 168 hours. BNP: Invalid input(s): "POCBNP" CBG: Recent Labs  Lab 04/26/23 0826 04/26/23 0953  GLUCAP 146* 175*    D-Dimer No results for input(s): "DDIMER" in the last 72 hours. Hgb A1c No results for input(s): "HGBA1C" in the last 72 hours. Lipid Profile No results for input(s): "CHOL", "HDL", "LDLCALC", "TRIG", "CHOLHDL", "LDLDIRECT" in the last 72 hours. Thyroid function studies No results for input(s): "TSH", "T4TOTAL", "T3FREE", "THYROIDAB" in the last 72 hours.  Invalid input(s): "FREET3" Anemia work up No results for input(s): "VITAMINB12", "FOLATE", "FERRITIN", "TIBC", "IRON", "RETICCTPCT" in the last 72 hours. Urinalysis    Component Value Date/Time   COLORURINE STRAW (A) 03/02/2022 1117   APPEARANCEUR CLEAR (A) 03/02/2022 1117   LABSPEC 1.008 03/02/2022 1117   PHURINE 5.0 03/02/2022 1117   GLUCOSEU NEGATIVE 03/02/2022 1117   HGBUR SMALL (A) 03/02/2022 1117   BILIRUBINUR NEGATIVE 03/02/2022 1117   KETONESUR NEGATIVE 03/02/2022 1117   PROTEINUR NEGATIVE 03/02/2022 1117   NITRITE NEGATIVE 03/02/2022 1117   LEUKOCYTESUR NEGATIVE 03/02/2022 1117   Sepsis Labs Recent Labs  Lab 04/24/23 0434 04/25/23 0426 04/26/23 0437 04/28/23 0445  WBC 7.5 10.5 13.1* 8.0   Microbiology Recent Results (from the past 240 hour(s))  Culture, blood (routine x 2)     Status: None   Collection Time: 04/21/23  1:47 AM   Specimen: BLOOD  Result Value Ref Range Status   Specimen Description BLOOD BLOOD RIGHT FOREARM  Final   Special Requests   Final    BOTTLES DRAWN AEROBIC AND ANAEROBIC Blood Culture adequate volume   Culture   Final    NO GROWTH 5 DAYS Performed at [facility], [address] [place], [place] [address]    Report Status 04/26/2023 FINAL  Final  Culture, blood (routine x 2)     Status: None   Collection Time: 04/21/23  1:51 AM   Specimen: BLOOD  Result Value Ref Range Status   Specimen Description BLOOD WEAKLY REACTIVE  Final   Special Requests   Final    BOTTLES DRAWN AEROBIC AND ANAEROBIC Blood Culture adequate volume   Culture   Final    NO GROWTH 5  DAYS Performed at [facility], [address] [place], [place] [address]    Report Status 04/26/2023 FINAL  Final  Fungus Culture With Stain     Status: None (Preliminary result)   Collection Time: 04/21/23  3:56 PM   Specimen: PATH Cytology Pleural fluid  Result Value Ref Range Status   Fungus Stain Final report  Final    Comment: (NOTE) Performed At: [facility] [address] [place], [place] [id number] Jolene Schimke MD OV:[id number]    Fungus (Mycology) Culture PENDING  Incomplete   Fungal Source PLEURAL  Final    Comment:  Performed at [facility] [place], [address] [place], [place] [address]  Body fluid culture w Gram Stain     Status: None   Collection Time: 04/21/23  3:56 PM   Specimen: PATH Cytology Pleural fluid  Result Value Ref Range Status   Specimen Description   Final    PLEURAL Performed at [facility], [address] [place], [place] [address]    Special Requests   Final    NONE Performed at [facility], [address] [place], [place] [address]    Gram Stain   Final    WBC PRESENT, PREDOMINANTLY MONONUCLEAR NO ORGANISMS SEEN    Culture   Final    NO GROWTH 3 DAYS Performed at [facility], [address] [place], [place] [address]    Report Status 04/25/2023 FINAL  Final  Fungus Culture Result     Status: None   Collection Time: 04/21/23  3:56 PM  Result Value Ref Range Status   Result 1 Comment  Final    Comment: (NOTE) KOH/Calcofluor preparation:  no fungus observed. Performed At: [facility] [address] [place], [place] [id number] Jolene Schimke MD [id number]      Total time spend on discharging this patient, including the last patient exam, discussing the hospital stay, instructions for ongoing care as it relates to all pertinent caregivers, as well as preparing the medical discharge records, prescriptions, and/or referrals as applicable, is 45 minutes.    Darlin Priestly,  MD  [facility] 04/28/2023, 10:22 AM

## 2023-04-28 NOTE — TOC Transition Note (Signed)
Transition of Care [facility]) - CM/SW Discharge Note   Patient Details  Name: [name] MRN: [id number] Date of Birth: [birth year]  Transition of Care [facility]) CM/SW Contact:  Chapman Fitch, RN Phone Number: 04/28/2023, 10:51 AM   Clinical Narrative:     Patient will DC to:[facility] Anticipated DC date: 04/28/23  Family notified: son Product/process development scientist WG:NFAO  Per MD patient ready for DC to . RN, patient, patient's family, and facility notified of DC. Discharge Summary sent to facility. RN given number for report.   TOC signing off.         Patient Goals and CMS Choice      Discharge Placement                         Discharge Plan and Services Additional resources added to the After Visit Summary for                                       Social Determinants of Health (SDOH) Interventions SDOH Screenings   Food Insecurity: No Food Insecurity (04/21/2023)  Housing: Low Risk  (04/21/2023)  Transportation Needs: No Transportation Needs (04/21/2023)  Utilities: Not At Risk (04/21/2023)  Depression (PHQ2-9): Low Risk  (04/13/2023)  Tobacco Use: Low Risk  (04/21/2023)     Readmission Risk Interventions    04/24/2023    3:33 PM 04/10/2023    1:51 PM  Readmission Risk Prevention Plan  Transportation Screening Complete Complete  PCP or Specialist Appt within 3-5 Days  Complete  Palliative Care Screening  Complete  Medication Review (RN Care Manager) Complete Complete  Skilled Nursing Facility Complete

## 2023-04-28 NOTE — Progress Notes (Signed)
Mobility Specialist - Progress Note   04/28/23 1153  Mobility  Activity Ambulated with assistance to bathroom  Level of Assistance Standby assist, set-up cues, supervision of patient - no hands on  Assistive Device Front wheel walker  Distance Ambulated (ft) 24 ft  Activity Response Tolerated well  $Mobility charge 1 Mobility  Mobility Specialist Start Time (ACUTE ONLY) 1143  Mobility Specialist Stop Time (ACUTE ONLY) 1150  Mobility Specialist Time Calculation (min) (ACUTE ONLY) 7 min   Pt requesting to use the bathroom, seated EOB upon entry. Pt STS to RW SBA and amb to/from the bathroom CGA for safety. Pt returned to bed, left seated EOB with alarm set and needs within reach.  Zetta Bills Mobility Specialist 04/28/23 12:02 PM

## 2023-04-28 NOTE — Progress Notes (Addendum)
Daily Progress Note   Patient Name: [name]       Date: 04/28/2023 DOB: 1945/10/11  Age: 77 y.o. MRN#: [id number] Attending Physician: Erin Priestly, MD Primary Care Physician: [name] Admit Date: 04/21/2023  Reason for Consultation/Follow-up: Establishing goals of care  Subjective: Notes and labs reviewed.  Into see patient.  She is currently sitting in bedside chair with her son and daughter-in-law at bedside.  I discussed plan for discharge to skilled nursing today.  They discuss patient's thoracentesis yesterday and plans for follow-up with surgery service regarding a VATS procedure outpatient.  Chaplain services working with patient and family on advance directive prior to patient's discharge.    Patient and son discuss that biologically, [name] is her grandson, however she legally adopted him when he was a few days old, so is legally her son.  Length of Stay: 7  Current Medications: Scheduled Meds:   ALPRAZolam  0.25 mg Oral BID   amiodarone  100 mg Oral Daily   apixaban  2.5 mg Oral BID   ascorbic acid  1,500 mg Oral Daily   escitalopram  10 mg Oral Daily   feeding supplement  237 mL Oral TID BM   fentaNYL  1 patch Transdermal Q72H   ferrous sulfate  325 mg Oral Daily   furosemide  40 mg Intravenous Daily   guaiFENesin  600 mg Oral BID   ipratropium-albuterol  3 mL Nebulization BID   levothyroxine  125 mcg Oral QAC breakfast   magnesium oxide  200 mg Oral Daily   multivitamin with minerals  1 tablet Oral Daily   omega-3 acid ethyl esters  1 capsule Oral Daily   pantoprazole  40 mg Oral Daily   polyvinyl alcohol  2 drop Both Eyes Daily   predniSONE  40 mg Oral Q breakfast    Continuous Infusions:   PRN Meds: acetaminophen **OR**  acetaminophen, albuterol, ALPRAZolam, bismuth subsalicylate, chlorpheniramine-HYDROcodone, diclofenac Sodium, fluticasone, HYDROcodone-acetaminophen, loratadine, magnesium hydroxide, morphine injection, nitroGLYCERIN, olopatadine, ondansetron **OR** ondansetron (ZOFRAN) IV, sodium chloride, traZODone  Physical Exam Pulmonary:     Effort: Pulmonary effort is normal.  Neurological:     Mental Status: She is alert.             Vital Signs: BP 127/67 (BP Location: Right Arm)   Pulse 81  Temp 97.9 F (36.6 C) (Oral)   Resp 16   Ht 5\' 2"  (1.575 m)   Wt 63.9 kg   SpO2 92%   BMI 25.77 kg/m  SpO2: SpO2: 92 % O2 Device: O2 Device: Room Air O2 Flow Rate: O2 Flow Rate (L/min): (S) 3 L/min  Intake/output summary:  Intake/Output Summary (Last 24 hours) at 04/28/2023 1202 Last data filed at 04/28/2023 1055 Gross per 24 hour  Intake 120 ml  Output --  Net 120 ml   LBM: Last BM Date : 04/26/23 Baseline Weight: Weight: 61.2 kg Most recent weight: Weight: 63.9 kg   Patient Active Problem List   Diagnosis Date Noted   Lymphoma (HCC) 04/26/2023   Palliative care encounter 04/26/2023   Shortness of breath 04/24/2023   Community acquired pneumonia 04/24/2023   Recurrent right pleural effusion 04/21/2023   Acute on chronic respiratory failure with hypoxia (HCC) 04/21/2023   GERD without esophagitis 04/21/2023   Paroxysmal atrial fibrillation (HCC) 04/21/2023   Pneumonia 04/21/2023   Anxiety 04/17/2023   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   Sacral fracture, closed (HCC) 04/15/2023   Fall 04/14/2023   Lumbar compression fracture (HCC) 04/14/2023   Unintentional weight loss 04/13/2023   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   Mood disorder (HCC) 04/13/2023   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) 04/12/2023   Pleural effusion on right 04/07/2023   Type II diabetes mellitus with  renal manifestations (HCC) 04/07/2023   Chronic kidney disease, stage 3a (HCC) 04/07/2023   Atrial fibrillation, chronic (HCC) 04/07/2023   DVT (deep venous thrombosis) (HCC) 04/07/2023   Leukocytosis 04/07/2023   Right foot pain 04/07/2023   Acute sinusitis 05/23/2022   COPD exacerbation (HCC) 05/22/2022   Atrial fibrillation with RVR (HCC) 05/22/2022   Chest pain 05/22/2022   HTN (hypertension) 05/22/2022   Diabetes mellitus without complication (HCC) 05/22/2022   COPD (chronic obstructive pulmonary disease) (HCC) 05/22/2022   Hypothyroidism 05/22/2022   Iron deficiency anemia 05/22/2022   Osteoarthritis of right knee 03/14/2022    Palliative Care Assessment & Plan     Recommendations/Plan: Patient and son discuss that biologically, [name] is her grandson, however she legally adopted him when he was a few days old, so is legally her son. Patient would like [name] to be her H POA, and his wife [name] to be secondary H POA.  Chaplain service facilitating completion of AD packet prior to discharge. PMT will sign off as patient is discharging.  Code Status:    Code Status Orders  (From admission, onward)           Start     Ordered   04/27/23 1154  Do not attempt resuscitation (DNR) Pre-Arrest Interventions Desired  (Code Status)  Continuous       Question Answer Comment  If pulseless and not breathing No CPR or chest compressions.   In Pre-Arrest Conditions (Patient Has Pulse and Is Breathing) May intubate, use advanced airway interventions and cardioversion/ACLS medications if appropriate or indicated. May transfer to ICU.   Consent: Discussion documented in EHR or advanced directives reviewed      04/27/23 1154           Code Status History     Date Active Date Inactive Code Status Order ID Comments User Context   04/21/2023 0354 04/27/2023 1154 Full Code [id number]  [facility] Vernetta Honey, MD ED   04/14/2023 1255 04/17/2023 2011 Full  Code [id number]  Floydene Flock,  MD ED   04/07/2023 2014 04/12/2023 1653 Full Code [id number]  Lorretta Harp, MD ED   05/22/2022 1609 05/25/2022 0007 Full Code [id number]  Lorretta Harp, MD ED   03/14/2022 1433 03/15/2022 1907 Full Code [id number]  Reinaldo Berber, MD Inpatient      Advance Directive Documentation    Flowsheet Row Most Recent Value  Type of Advance Directive Out of facility DNR (pink MOST or yellow form)  Pre-existing out of facility DNR order (yellow form or pink MOST form) Pink MOST/Yellow Form most recent copy in chart - Physician notified to receive inpatient order  "MOST" Form in Place? --    Care plan was discussed with chaplain  Thank you for allowing the Palliative Medicine Team to assist in the care of this patient.   Morton Stall, NP  Please contact Palliative Medicine Team phone at [phone] for questions and concerns.

## 2023-04-28 NOTE — Plan of Care (Signed)

## 2023-04-29 ENCOUNTER — Other Ambulatory Visit: Payer: Self-pay

## 2023-04-29 ENCOUNTER — Emergency Department: Payer: Medicare (Managed Care)

## 2023-04-29 ENCOUNTER — Encounter: Payer: Self-pay | Admitting: *Deleted

## 2023-04-29 ENCOUNTER — Emergency Department
Admission: EM | Admit: 2023-04-29 | Discharge: 2023-04-29 | Disposition: A | Discharge status: Home / Self Care | Payer: Medicare (Managed Care) | Attending: Student in an Organized Health Care Education/Training Program | Admitting: Student in an Organized Health Care Education/Training Program

## 2023-04-29 DIAGNOSIS — J449 Chronic obstructive pulmonary disease, unspecified: Secondary | ICD-10-CM | POA: Diagnosis not present

## 2023-04-29 DIAGNOSIS — N183 Chronic kidney disease, stage 3 unspecified: Secondary | ICD-10-CM | POA: Insufficient documentation

## 2023-04-29 DIAGNOSIS — R0602 Shortness of breath: Secondary | ICD-10-CM | POA: Insufficient documentation

## 2023-04-29 LAB — COMPREHENSIVE METABOLIC PANEL
ALT: 22 U/L (ref 0–44)
AST: 29 U/L (ref 15–41)
Albumin: 3.4 g/dL — ABNORMAL LOW (ref 3.5–5.0)
Alkaline Phosphatase: 42 U/L (ref 38–126)
Anion gap: 11 (ref 5–15)
BUN: 41 mg/dL — ABNORMAL HIGH (ref 8–23)
CO2: 31 mmol/L (ref 22–32)
Calcium: 8.8 mg/dL — ABNORMAL LOW (ref 8.9–10.3)
Chloride: 93 mmol/L — ABNORMAL LOW (ref 98–111)
Creatinine, Ser: 0.89 mg/dL (ref 0.44–1.00)
GFR, Estimated: >60 mL/min (ref >60)
Glucose, Bld: 103 mg/dL — ABNORMAL HIGH (ref 70–99)
Potassium: 3.6 mmol/L (ref 3.5–5.1)
Sodium: 135 mmol/L (ref 135–145)
Total Bilirubin: 0.7 mg/dL (ref <1.2)
Total Protein: 7.1 g/dL (ref 6.5–8.1)

## 2023-04-29 LAB — CBC WITH DIFFERENTIAL/PLATELET
Abs Immature Granulocytes: 0.15 10*3/uL — ABNORMAL HIGH (ref 0.00–0.07)
Basophils Absolute: 0 10*3/uL (ref 0.0–0.1)
Basophils Relative: 0 %
Eosinophils Absolute: 0.1 10*3/uL (ref 0.0–0.5)
Eosinophils Relative: 1 %
HCT: 37.8 % (ref 36.0–46.0)
Hemoglobin: 12.2 g/dL (ref 12.0–15.0)
Immature Granulocytes: 1 %
Lymphocytes Relative: 17 %
Lymphs Abs: 1.8 10*3/uL (ref 0.7–4.0)
MCH: 30.1 pg (ref 26.0–34.0)
MCHC: 32.3 g/dL (ref 30.0–36.0)
MCV: 93.3 fL (ref 80.0–100.0)
Monocytes Absolute: 0.8 10*3/uL (ref 0.1–1.0)
Monocytes Relative: 7 %
Neutro Abs: 8.1 10*3/uL — ABNORMAL HIGH (ref 1.7–7.7)
Neutrophils Relative %: 74 %
Platelets: 284 10*3/uL (ref 150–400)
RBC: 4.05 MIL/uL (ref 3.87–5.11)
RDW: 13.4 % (ref 11.5–15.5)
WBC: 11 10*3/uL — ABNORMAL HIGH (ref 4.0–10.5)
nRBC: 0 % (ref 0.0–0.2)

## 2023-04-29 LAB — TROPONIN I (HIGH SENSITIVITY): Troponin I (High Sensitivity): 12 ng/L (ref <18)

## 2023-04-29 LAB — BRAIN NATRIURETIC PEPTIDE: B Natriuretic Peptide: 319.2 pg/mL — ABNORMAL HIGH (ref 0.0–100.0)

## 2023-04-29 MED ORDER — IPRATROPIUM-ALBUTEROL 0.5-2.5 (3) MG/3ML IN SOLN
3.0000 mL | Freq: Once | RESPIRATORY_TRACT | Status: AC
  Administered 2023-04-29: 3 mL via RESPIRATORY_TRACT
  Filled 2023-04-29: qty 3

## 2023-04-29 NOTE — ED Notes (Signed)
Son at [facility] requesting "assistance with transport home d/t two sacral fractures". Pt refusing to go back to [facility]. Pt /son verbalizing "will go home". TOC assistance requested for transportation.

## 2023-04-29 NOTE — ED Notes (Signed)
EDP at [facility] 

## 2023-04-29 NOTE — ED Notes (Signed)
Xray at [facility]

## 2023-04-29 NOTE — ED Notes (Signed)
Son at [facility]. EDP in to see. Up to [facility] via w/c with O2.

## 2023-04-29 NOTE — ED Notes (Signed)
Returned call to: Delphia Grates MD, [phone] PACE provider, she is familiar with pts hx and needs, and obstacles and wants to assist with safe d/c plan as needed. Has spoken with son and facility that has all of her meds, DME and belongings.

## 2023-04-29 NOTE — ED Notes (Signed)
SW not available. EDP notified.

## 2023-04-29 NOTE — ED Notes (Signed)
Takes lasix, placed on purewick.

## 2023-04-29 NOTE — ED Notes (Signed)
TOC/ SW paged, pending return call.

## 2023-04-29 NOTE — ED Triage Notes (Signed)
BIB [facility] from [facility] for sob. In and out of hospital recently multiple times for falls, and sob. Recent Dx: of PNA and pleural effusions Afebrile. Takes lasix 40mg  daily. Verbalizes not sure if they have missed giving me some of my meds. CO2 22. BP 150/80. EMS unsuccessful with IV x2. 98% on baseline 4L [place]. MOST form states "full measures". Sx onset worsened last night. Arrives alert, NAD, calm, interactive.

## 2023-04-29 NOTE — ED Provider Notes (Signed)
[facility] Provider Note    Event Date/Time   First MD Initiated Contact with Patient 04/29/23 1348     (approximate)   History   Shortness of Breath   HPI  [name] is a 77 y.o. female history of CKD stage III, COPD, A-fib with RVR on 4 L nasal cannula chronically as well as history of DVT on anticoagulation presents to the ER after coughing fit.  Patient states that she been compliant with her medications.  Denies any other questions or concerns.  Denies any chest discomfort at this time.  Denies any shortness of breath.     Physical Exam   Triage Vital Signs: ED Triage Vitals [04/29/23 1348]  Encounter Vitals Group     BP      Systolic BP Percentile      Diastolic BP Percentile      Pulse      Resp      Temp      Temp src      SpO2 99 %     Weight      Height      Head Circumference      Peak Flow      Pain Score      Pain Loc      Pain Education      Exclude from Growth Chart     Most recent vital signs: Vitals:   04/29/23 1410 04/29/23 1415  BP: 130/70 139/71  Pulse: 72 71  Resp: (!) 22 17  Temp: 97.8 F (36.6 C)   SpO2: 100% 98%     Constitutional: Alert  Eyes: Conjunctivae are normal.  Head: Atraumatic. Nose: No congestion/rhinnorhea. Mouth/Throat: Mucous membranes are moist.   Neck: Painless ROM.  Cardiovascular:   Good peripheral circulation. Respiratory: Normal respiratory effort.  Coarse bibasilar breath sounds. Gastrointestinal: Soft and nontender.  Musculoskeletal:  no deformity Neurologic:  MAE spontaneously. No gross focal neurologic deficits are appreciated.  Skin:  Skin is warm, dry and intact. No rash noted. Psychiatric: Mood and affect are normal. Speech and behavior are normal.    ED Results / Procedures / Treatments   Labs (all labs ordered are listed, but only abnormal results are displayed) Labs Reviewed  CBC WITH DIFFERENTIAL/PLATELET - Abnormal; Notable for the following components:       Result Value   WBC 11.0 (*)    Neutro Abs 8.1 (*)    Abs Immature Granulocytes 0.15 (*)    All other components within normal limits  COMPREHENSIVE METABOLIC PANEL - Abnormal; Notable for the following components:   Chloride 93 (*)    Glucose, Bld 103 (*)    BUN 41 (*)    Calcium 8.8 (*)    Albumin 3.4 (*)    All other components within normal limits  BRAIN NATRIURETIC PEPTIDE - Abnormal; Notable for the following components:   B Natriuretic Peptide 319.2 (*)    All other components within normal limits  TROPONIN I (HIGH SENSITIVITY)     EKG  ED ECG REPORT I, Willy Eddy, the attending physician, personally viewed and interpreted this ECG.   Date: 04/29/2023  EKG Time: 13:47  Rate: 75  Rhythm: sinus  Axis: right  Intervals: rbbb  ST&T Change:  no stemi, nonspecific st abn     RADIOLOGY Please see ED Course for my review and interpretation.  I personally reviewed all radiographic images ordered to evaluate for the above acute complaints and reviewed radiology reports and  findings.  These findings were personally discussed with the patient.  Please see medical record for radiology report.    PROCEDURES:  Critical Care performed:   Procedures   MEDICATIONS ORDERED IN ED: Medications  ipratropium-albuterol (DUONEB) 0.5-2.5 (3) MG/3ML nebulizer solution 3 mL (3 mLs Nebulization Given 04/29/23 1453)     IMPRESSION / MDM / ASSESSMENT AND PLAN / ED COURSE  I reviewed the triage vital signs and the nursing notes.                              Differential diagnosis includes, but is not limited to, Asthma, copd, CHF, pna, ptx, malignancy, Pe, anemia  Patient presenting to the ER for evaluation of symptoms as described above.  Based on symptoms, risk factors and considered above differential, this presenting complaint could reflect a potentially life-threatening illness therefore the patient will be placed on continuous pulse oximetry and telemetry for  monitoring.  Laboratory evaluation will be sent to evaluate for the above complaints.  Patient nontoxic-appearing no hypoxia on baseline supplemental oxygen.  No significant wheezing on exam good air movement.  She is on anticoagulation has been compliant with these medications.  Vital signs are otherwise stable.   Clinical Course as of 04/29/23 1515  Sat Apr 29, 2023  1512 Patient reassessed.  States that she had a coughing fit is now feeling at baseline.  Her workup is otherwise reassuring chest x-ray without evidence of pneumothorax or consolidation.  She does not appear to be having significant COPD exacerbation on further conversation with patient and family it seems like there is some anxiety and frustration with her facility and she is requesting discharge back to home as she feels ready.  Family does not have any concerns with her going back to their home.  Discussed option for further observation and even discussion about additional placement considerations.  This has been declined at this time but agrees to return if symptoms recur.  I think she is appropriate for discharge at this time. [PR]    Clinical Course User Index [PR] Willy Eddy, MD     FINAL CLINICAL IMPRESSION(S) / ED DIAGNOSES   Final diagnoses:  Shortness of breath     Rx / DC Orders   ED Discharge Orders     None        Note:  This document was prepared using Dragon voice recognition software and may include unintentional dictation errors.    Willy Eddy, MD 04/29/23 1515

## 2023-05-01 ENCOUNTER — Other Ambulatory Visit: Payer: Self-pay | Admitting: Family Medicine

## 2023-05-01 ENCOUNTER — Telehealth: Payer: Self-pay

## 2023-05-01 DIAGNOSIS — J90 Pleural effusion, not elsewhere classified: Secondary | ICD-10-CM

## 2023-05-01 NOTE — Telephone Encounter (Signed)
-----   Message from [name] sent at 05/01/2023  8:17 AM EST ----- This patient has been discharged. She is supposed to see Dr.Lightfoot for VAT/biopsy, currently she is scheduled on 12/13. Please call Dr. Lucilla Lame office to see if she could be seen earlier. Prefer this week.   Thanks.   [name]

## 2023-05-01 NOTE — Telephone Encounter (Signed)
Reached out to Dr. Lucilla Lame office and per Pearletha Alfred, Dr. Cliffton Asters is out of office this week and 12/13 is the earliest available, but she will jot down her name if he has any cancellations next week and get her rescheduled.

## 2023-05-01 NOTE — Telephone Encounter (Signed)
Left message for Karie Mainland to call the office to schedule appt.

## 2023-05-03 ENCOUNTER — Ambulatory Visit
Admission: RE | Admit: 2023-05-03 | Discharge: 2023-05-03 | Disposition: A | Discharge status: Home / Self Care | Payer: Medicare (Managed Care) | Source: Ambulatory Visit | Attending: Student | Admitting: Student

## 2023-05-03 ENCOUNTER — Encounter: Payer: Self-pay | Admitting: Oncology

## 2023-05-03 ENCOUNTER — Inpatient Hospital Stay (HOSPITAL_BASED_OUTPATIENT_CLINIC_OR_DEPARTMENT_OTHER): Payer: Medicare (Managed Care) | Admitting: Oncology

## 2023-05-03 ENCOUNTER — Other Ambulatory Visit: Payer: Self-pay | Admitting: Student

## 2023-05-03 ENCOUNTER — Ambulatory Visit
Admission: RE | Admit: 2023-05-03 | Discharge: 2023-05-03 | Disposition: A | Discharge status: Home / Self Care | Payer: Medicare (Managed Care) | Source: Ambulatory Visit | Attending: Family Medicine | Admitting: Family Medicine

## 2023-05-03 VITALS — BP 134/65 | HR 62 | Temp 98.4°F | Resp 16 | Wt 135.0 lb

## 2023-05-03 DIAGNOSIS — R634 Abnormal weight loss: Secondary | ICD-10-CM | POA: Diagnosis not present

## 2023-05-03 DIAGNOSIS — D72820 Lymphocytosis (symptomatic): Secondary | ICD-10-CM

## 2023-05-03 DIAGNOSIS — J90 Pleural effusion, not elsewhere classified: Secondary | ICD-10-CM | POA: Insufficient documentation

## 2023-05-03 DIAGNOSIS — N183 Chronic kidney disease, stage 3 unspecified: Secondary | ICD-10-CM

## 2023-05-03 DIAGNOSIS — Z9889 Other specified postprocedural states: Secondary | ICD-10-CM

## 2023-05-03 DIAGNOSIS — K7460 Unspecified cirrhosis of liver: Secondary | ICD-10-CM | POA: Diagnosis not present

## 2023-05-03 DIAGNOSIS — E1122 Type 2 diabetes mellitus with diabetic chronic kidney disease: Secondary | ICD-10-CM

## 2023-05-03 LAB — CULTURE, FUNGUS WITHOUT SMEAR

## 2023-05-03 MED ORDER — LIDOCAINE HCL (PF) 1 % IJ SOLN
8.0000 mL | Freq: Once | INTRAMUSCULAR | Status: AC
  Administered 2023-05-03: 8 mL via SUBCUTANEOUS
  Filled 2023-05-03: qty 8

## 2023-05-03 NOTE — Assessment & Plan Note (Signed)
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 Bone marrow negative for lymphoma involvement.  Peripheral blood flow cytometry is negative.  Clinically it does not appear that she has disseminated lymphoma disease. 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 She has appt with thoracic surgery Dr. Cliffton Asters on 12/13.  Recommend thoracentesis PRN  Recommend pleural catheter placement.

## 2023-05-03 NOTE — Assessment & Plan Note (Signed)
Refer to nutritionist.  Recommend nutrition supplementation Pending above work up

## 2023-05-03 NOTE — Assessment & Plan Note (Signed)
Refer to GI

## 2023-05-03 NOTE — Progress Notes (Signed)
Hematology/Oncology Progress note [phone] [phone]        REFERRING PROVIDER: [facility] Service*    CHIEF COMPLAINTS/PURPOSE OF CONSULTATION:  Pleural effusion, monoclonal lymphocytosis   ASSESSMENT & PLAN:   Pleural effusion on right 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 Bone marrow negative for lymphoma involvement.  Peripheral blood flow cytometry is negative.  Clinically it does not appear that she has disseminated lymphoma disease. 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 She has appt with thoracic surgery Dr. Cliffton Asters on 12/13.  Recommend thoracentesis PRN  Recommend pleural catheter placement.     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    Unintentional weight loss Refer to nutritionist.  Recommend nutrition supplementation Pending above work up  Cirrhosis of liver without ascites (HCC) Refer to GI  Monoclonal B-cell lymphocytosis See above plan   No orders of the defined types were placed in this encounter.   All questions were answered. The patient knows to call the clinic with any problems, questions or concerns.  Rickard Patience, MD, PhD [facility] Hematology Oncology 05/03/2023    HISTORY OF PRESENTING ILLNESS:  [name] 77 y.o. female presents to establish care for pleural effusion, unintentional weight loss.   She was recently hospitalized due to dyspnea and chest tightness.  04/07/2023  CT scan revealed significant pleural effusion, which was subsequently drained. Flow cytometry of the fluid indicated potential lymphoma cells. The patient has been experiencing inconsistent breathing, with noticeable  shortness of breath upon exertion, a significant change from her previous ability to walk for a mile multiple times a week. These symptoms began approximately two weeks prior to the consultation.  The patient's appetite has been notably decreased, with consumption of only one substantial meal per day. This has resulted in a weight loss of over twenty pounds in the past few months. The patient reports some sweating, not on a daily basis, for the past year.  The patient also reports a history of shoulder injury due to a fall down a flight of stairs in the 1990s, which required multiple surgeries. She currently manages the pain with Tramadol. The patient lives alone and has been experiencing difficulty with mobility due to her current health condition.   INTERVAL HISTORY [name] is a 77 y.o. female who has above history reviewed by me today presents for follow up visit for recurrent pleural effusion, lymphoma cells in fluid study, unintentional weight loss.   She got thoracentesis today. She reports breathing is better.  CXR showed improved pleural effusion.  No new complaints. Poor oral intake. Lost weight.   MEDICAL HISTORY:  Past Medical History:  Diagnosis Date   Anemia    Asthma    COPD (chronic obstructive pulmonary disease) (HCC)    Diabetes mellitus without complication (HCC)    type 2   DVT (deep venous thrombosis) (HCC) 1980   GERD (gastroesophageal reflux disease)    History of hiatal hernia    Hypertension    Hypothyroidism     SURGICAL HISTORY: Past Surgical History:  Procedure Laterality Date   CATARACT EXTRACTION Bilateral    CHOLECYSTECTOMY     COLONOSCOPY     ESOPHAGOGASTRODUODENOSCOPY (EGD) WITH PROPOFOL N/A 01/17/2022   Procedure: ESOPHAGOGASTRODUODENOSCOPY (EGD) WITH PROPOFOL;  Surgeon: Jaynie Collins, DO;  Location: [facility] ENDOSCOPY;  Service: Gastroenterology;  Laterality: N/A;   HUMERUS SURGERY Left    fracture (7 surgeries total)   IR BONE  MARROW BIOPSY & ASPIRATION  04/26/2023   MENISCUS REPAIR Right 2019   THYROIDECTOMY     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; reconstruction from hip bone   TONSILECTOMY, ADENOIDECTOMY, BILATERAL MYRINGOTOMY AND TUBES     TOTAL KNEE ARTHROPLASTY Right 03/14/2022   Procedure: TOTAL KNEE ARTHROPLASTY;  Surgeon: Reinaldo Berber, MD;  Location: [facility] ORS;  Service: Orthopedics;  Laterality: Right;   TOTAL SHOULDER REPLACEMENT Left    TUBAL LIGATION     WRIST SURGERY Left    multiple fractures from falls (14 surgeries from wrist to shoulder)    SOCIAL HISTORY: Social History   Socioeconomic History   Marital status: Widowed    Spouse name: Not on file   Number of children: 6   Years of education: Not on file   Highest education level: Not on file  Occupational History   Not on file  Tobacco Use   Smoking status: Never   Smokeless tobacco: Never  Vaping Use   Vaping status: Never Used  Substance and Sexual Activity   Alcohol use: Never   Drug use: Never   Sexual activity: Not Currently  Other Topics Concern   Not on file  Social History Narrative   Lives in senior housing in [place] (apt[place]   Social Determinants of Health   Financial Resource Strain: Not on file  Food Insecurity: No Food Insecurity (04/21/2023)   Hunger Vital Sign    Worried About Running Out of Food in the Last Year: Never true    Ran Out of Food in the Last Year: Never true  Transportation Needs: No Transportation Needs (04/21/2023)   PRAPARE - Administrator, Civil Service (Medical): No    Lack of Transportation (Non-Medical): No  Physical Activity: Not on file  Stress: Not on file  Social Connections: Not on file  Intimate Partner Violence: Not At Risk (04/21/2023)   Humiliation, Afraid, Rape, and Kick questionnaire    Fear of Current or Ex-Partner: No    Emotionally Abused: No    Physically Abused: No    Sexually Abused: No    FAMILY HISTORY: Family History  Problem Relation Age of  Onset   Kidney disease Mother    Tuberculosis Father     ALLERGIES:  is allergic to cephalosporins, codeine, contrast media [iodinated contrast media], and oxycodone.  MEDICATIONS:  Current Outpatient Medications  Medication Sig Dispense Refill   acetaminophen (TYLENOL) 325 MG tablet Take 1,300 mg by mouth every 6 (six) hours as needed for moderate pain.     ADVAIR HFA 115-21 MCG/ACT inhaler Inhale 2 puffs into the lungs 2 (two) times daily.     albuterol (VENTOLIN HFA) 108 (90 Base) MCG/ACT inhaler Inhale 2-4 puffs by mouth every 4 hours as needed for wheezing, cough, and/or shortness of breath 6.7 g 0   ALPRAZolam (XANAX) 0.25 MG tablet Take 1 tablet (0.25 mg total) by mouth 2 (two) times daily as needed for anxiety. 20 tablet 0   amiodarone (PACERONE) 100 MG tablet Take 1 tablet (100 mg total) by mouth daily. 30 tablet 0   apixaban (ELIQUIS) 2.5 MG TABS tablet Take 1 tablet (2.5 mg total) by mouth 2 (two) times daily. 60 tablet 1   Ascorbic Acid (VITAMIN C CR) 1500 MG TBCR Take 1 tablet by  mouth daily.     bismuth subsalicylate (PEPTO BISMOL) 262 MG/15ML suspension Take 30 mLs by mouth every 6 (six) hours as needed for indigestion or diarrhea or loose stools.     Carboxymeth-Glycerin-Polysorb (REFRESH OPTIVE ADVANCED) 0.5-1-0.5 % SOLN Apply 2 drops to eye daily.     chlorpheniramine-HYDROcodone (TUSSIONEX) 10-8 MG/5ML Take 10 mLs by mouth at bedtime for 5 days. 50 mL 0   diclofenac Sodium (VOLTAREN) 1 % GEL Apply 2 g topically 4 (four) times daily as needed.     Emollient (EUCERIN) lotion Apply 1 Application topically as needed for dry skin.     escitalopram (LEXAPRO) 10 MG tablet Take 1 tablet (10 mg total) by mouth daily.     esomeprazole (NEXIUM) 40 MG capsule Take 40 mg by mouth 2 (two) times daily.     feeding supplement (ENSURE ENLIVE / ENSURE PLUS) LIQD Take 237 mLs by mouth 3 (three) times daily between meals.     ferrous sulfate 325 (65 FE) MG tablet Take 325 mg by mouth daily.      fluticasone (FLONASE) 50 MCG/ACT nasal spray Place 2 sprays into both nostrils daily as needed for allergies or rhinitis.     furosemide (LASIX) 20 MG tablet Take 2 tablets (40 mg total) by mouth daily.     guaiFENesin (MUCINEX) 600 MG 12 hr tablet Take 600 mg by mouth 2 (two) times daily as needed for to loosen phlegm or cough.     guaiFENesin-dextromethorphan (ROBITUSSIN DM) 100-10 MG/5ML syrup Take 5 mLs by mouth every 6 (six) hours for 5 days.     HYDROcodone-acetaminophen (NORCO/VICODIN) 5-325 MG tablet Take 1-2 tablets by mouth every 6 (six) hours as needed for up to 5 days for moderate pain (pain score 4-6) or severe pain (pain score 7-10). 30 tablet 0   ipratropium-albuterol (DUONEB) 0.5-2.5 (3) MG/3ML SOLN Take 3 mLs by nebulization every 4 (four) hours as needed.     ketoconazole (NIZORAL) 2 % shampoo Apply 1 Application topically 2 (two) times a week.     ketotifen (ZADITOR) 0.025 % ophthalmic solution Place 1 drop into both eyes daily as needed.     levothyroxine (SYNTHROID) 125 MCG tablet Take 125 mcg by mouth daily before breakfast.     loratadine (CLARITIN) 10 MG tablet Take 1 tablet (10 mg total) by mouth daily as needed for allergies, rhinitis or itching. Home med.     Magnesium 250 MG TABS Take 1 tablet by mouth daily.     Multiple Vitamin (MULTIVITAMIN WITH MINERALS) TABS tablet Take 1 tablet by mouth daily.     nitroGLYCERIN (NITROSTAT) 0.4 MG SL tablet Place 0.4 mg under the tongue every 5 (five) minutes as needed for chest pain.     olopatadine (PATANOL) 0.1 % ophthalmic solution Place 1 drop into both eyes daily as needed for allergies.     Omega-3 1000 MG CAPS Take 1 capsule by mouth daily.     ondansetron (ZOFRAN) 4 MG tablet Take 4 mg by mouth every 8 (eight) hours as needed for nausea or vomiting.     OXYGEN Inhale 2 L into the lungs at bedtime.     rOPINIRole (REQUIP) 0.25 MG tablet Take 0.25 mg by mouth at bedtime.     sodium chloride (OCEAN) 0.65 % SOLN nasal spray  Place 1 spray into both nostrils as needed for congestion.  0   SYSTANE ULTRA 0.4-0.3 % SOLN Apply 2 drops to eye QID.     traZODone (DESYREL) 50  MG tablet Take 50 mg by mouth at bedtime as needed for sleep.     TURMERIC PO Take 500 mg by mouth daily.     VITAMIN D-VITAMIN K PO Take 45 mcg by mouth daily.     Zinc Oxide 10 % OINT Apply 1 application  topically 3 (three) times daily as needed.     No current facility-administered medications for this visit.    Review of Systems  Constitutional:  Positive for unexpected weight change. Negative for appetite change, chills, fatigue and fever.  HENT:   Negative for hearing loss and voice change.   Eyes:  Negative for eye problems.  Respiratory:  Positive for shortness of breath. Negative for chest tightness and cough.   Cardiovascular:  Negative for chest pain.  Gastrointestinal:  Negative for abdominal distention, abdominal pain and blood in stool.  Endocrine: Negative for hot flashes.  Genitourinary:  Negative for difficulty urinating and frequency.   Musculoskeletal:  Negative for arthralgias.  Skin:  Negative for itching and rash.  Neurological:  Negative for extremity weakness.  Hematological:  Negative for adenopathy.  Psychiatric/Behavioral:  Positive for depression. Negative for confusion. The patient is nervous/anxious.      PHYSICAL EXAMINATION: ECOG PERFORMANCE STATUS: 1 - Symptomatic but completely ambulatory  Vitals:   05/03/23 1358  BP: 134/65  Pulse: 62  Resp: 16  Temp: 98.4 F (36.9 C)  SpO2: 97%   Filed Weights   05/03/23 1358  Weight: 135 lb (61.2 kg)    Physical Exam Constitutional:      General: She is not in acute distress. HENT:     Head: Normocephalic and atraumatic.  Eyes:     General: No scleral icterus. Cardiovascular:     Rate and Rhythm: Normal rate and regular rhythm.  Pulmonary:     Effort: Pulmonary effort is normal. No respiratory distress.     Comments: Decreased breath sound right  lower lobe Abdominal:     General: There is no distension.     Palpations: Abdomen is soft.     Tenderness: There is no abdominal tenderness.  Musculoskeletal:        General: Normal range of motion.     Cervical back: Normal range of motion and neck supple.  Skin:    General: Skin is dry.     Findings: No erythema.  Neurological:     Mental Status: She is alert and oriented to person, place, and time. Mental status is at baseline.     Cranial Nerves: No cranial nerve deficit.     Motor: No abnormal muscle tone.  Psychiatric:        Mood and Affect: Affect normal.      LABORATORY DATA:  I have reviewed the data as listed    Latest Ref Rng & Units 04/29/2023    1:49 PM 04/28/2023    4:45 AM 04/26/2023    4:37 AM  CBC  WBC 4.0 - 10.5 K/uL 11.0  8.0  13.1   Hemoglobin 12.0 - 15.0 g/dL 16.1  09.6  04.5   Hematocrit 36.0 - 46.0 % 37.8  30.7  30.4   Platelets 150 - 400 K/uL 284  240  241       Latest Ref Rng & Units 04/29/2023    1:49 PM 04/28/2023    4:45 AM 04/26/2023    4:37 AM  CMP  Glucose 70 - 99 mg/dL 409  811  914   BUN 8 - 23 mg/dL 41  41  37   Creatinine 0.44 - 1.00 mg/dL 6.04  5.40  9.81   Sodium 135 - 145 mmol/L 135  135  137   Potassium 3.5 - 5.1 mmol/L 3.6  4.4  4.9   Chloride 98 - 111 mmol/L 93  92  97   CO2 22 - 32 mmol/L 31  32  31   Calcium 8.9 - 10.3 mg/dL 8.8  8.5  8.8   Total Protein 6.5 - 8.1 g/dL 7.1     Total Bilirubin <1.2 mg/dL 0.7     Alkaline Phos 38 - 126 U/L 42     AST 15 - 41 U/L 29     ALT 0 - 44 U/L 22        RADIOGRAPHIC STUDIES: I have personally reviewed the radiological images as listed and agreed with the findings in the report. DG Chest Port 1 View  Result Date: 05/03/2023 CLINICAL DATA:  Post thoracentesis EXAM: PORTABLE CHEST 1 VIEW COMPARISON:  X-ray 04/29/2023 and older FINDINGS: Left shoulder reverse arthroplasty seen. Loop recorder. Normal cardiopericardial silhouette with calcified aorta. Prominent calcifications  along the mitral valve annulus. Decreasing tiny right pleural effusion. No pneumothorax or consolidation. No edema. Surgical clips in the right upper quadrant. Degenerative changes of the spine. IMPRESSION: Decreasing right pleural effusion. No pneumothorax post thoracentesis. Electronically Signed   By: Karen Kays M.D.   On: 05/03/2023 15:02   US THORACENTESIS ASP PLEURAL SPACE W/IMG GUIDE  Result Date: 05/03/2023 INDICATION: 77 year old female with history of COPD, lymphoma with recurrent symptomatic right pleural effusion previous thoracentesis have been helpful with improvement in breathing. Request received for therapeutic right thoracentesis. EXAM: ULTRASOUND GUIDED RIGHT THORACENTESIS MEDICATIONS: 10 mL 1% lidocaine COMPLICATIONS: None immediate. PROCEDURE: An ultrasound guided thoracentesis was thoroughly discussed with the patient and questions answered. The benefits, risks, alternatives and complications were also discussed. The patient understands and wishes to proceed with the procedure. Written consent was obtained. Ultrasound was performed to localize and mark an adequate pocket of fluid in the right chest. The area was then prepped and draped in the normal sterile fashion. 1% Lidocaine was used for local anesthesia. Under ultrasound guidance a 6 Fr Safe-T-Centesis catheter was introduced. Thoracentesis was performed. The catheter was removed and a dressing applied. FINDINGS: A total of approximately 500 mL of amber fluid was removed. IMPRESSION: Successful ultrasound guided right thoracentesis yielding 500 mL of pleural fluid. Performed by: Loyce Dys PA-C Electronically Signed   By: Judie Petit.  Shick M.D.   On: 05/03/2023 13:47   DG Chest Portable 1 View  Result Date: 04/29/2023 CLINICAL DATA:  Shortness of breath. EXAM: PORTABLE CHEST 1 VIEW COMPARISON:  04/27/2023. FINDINGS: There is small right pleural effusion, decreased since the prior study. Bilateral lung fields are clear. No acute  consolidation or lung collapse. Left lateral costophrenic angle is clear. No pneumothorax on either side. Stable cardio-mediastinal silhouette. Presumed loop recorder device noted overlying the left lower chest wall. No acute osseous abnormalities. Old healed fractures of posterior left fourth through seventh ribs noted. Left shoulder reverse arthroplasty noted. The soft tissues are within normal limits. IMPRESSION: *Small right pleural effusion, decreased since the prior study. No pneumothorax. Electronically Signed   By: Jules Schick M.D.   On: 04/29/2023 14:12   DG Chest Port 1 View  Result Date: 04/27/2023 CLINICAL DATA:  Right pleural effusion, status post thoracentesis EXAM: PORTABLE CHEST 1 VIEW COMPARISON:  04/27/2023 12:46 p.m. FINDINGS: Substantially reduced size of the right  pleural effusion. No pneumothorax. There is some residual indistinct airspace opacity at the right lung base probably from atelectasis, along with some residual blunting of the right lateral costophrenic angle. Loop recorder and left shoulder reverse arthroplasty again noted. Bony demineralization. Old left rib fractures. IMPRESSION: 1. Substantially reduced size of the right pleural effusion, without pneumothorax. 2. Residual indistinct airspace opacity at the right lung base probably from atelectasis. Blunting the right lateral costophrenic angle indicating some degree of residual pleural effusion. 3. Bony demineralization. Electronically Signed   By: Gaylyn Rong M.D.   On: 04/27/2023 17:07   DG Chest 2 View  Result Date: 04/27/2023 CLINICAL DATA:  Pleural effusions EXAM: CHEST - 2 VIEW COMPARISON:  04/22/2023 FINDINGS: Normal cardiac silhouette. Moderate RIGHT effusion improved slightly from comparison exam. No pneumothorax. LEFT lung clear. LEFT shoulder arthroplasty IMPRESSION: Slight improvement in moderate RIGHT effusion. Electronically Signed   By: Genevive Bi M.D.   On: 04/27/2023 16:34   US  THORACENTESIS ASP PLEURAL SPACE W/IMG GUIDE  Result Date: 04/27/2023 INDICATION: Patient with history of COPD, lymphoma with recurrent symptomatic right pleural effusion previous thoracentesis have been helpful with improvement in breathing. Request received for therapeutic right thoracentesis. EXAM: ULTRASOUND GUIDED RIGHT THORACENTESIS MEDICATIONS: Local 1% lidocaine only. COMPLICATIONS: None immediate. PROCEDURE: An ultrasound guided thoracentesis was thoroughly discussed with the patient and questions answered. The benefits, risks, alternatives and complications were also discussed. The patient understands and wishes to proceed with the procedure. Written consent was obtained. Ultrasound was performed to localize and mark an adequate pocket of fluid in the right chest. The area was then prepped and draped in the normal sterile fashion. 1% Lidocaine was used for local anesthesia. Under ultrasound guidance a 19 gauge, 7-cm, Yueh catheter was introduced. Thoracentesis was performed. The catheter was removed and a dressing applied. FINDINGS: A total of approximately 1.1 L of amber/bloody fluid was removed. Samples were sent to the laboratory as requested by the clinical team. IMPRESSION: Successful ultrasound guided right thoracentesis yielding 1.1 L of pleural fluid. This exam was performed by Pattricia Boss PA-C, and was supervised and interpreted by Dr. Fredia Sorrow. Electronically Signed   By: Irish Lack M.D.   On: 04/27/2023 15:43   IR BONE MARROW BIOPSY & ASPIRATION  Result Date: 04/26/2023 INDICATION: Lymphoma EXAM: Bone marrow aspiration and core biopsy using fluoroscopic guidance MEDICATIONS: None. ANESTHESIA/SEDATION: Moderate (conscious) sedation was employed during this procedure. A total of Versed 1.5 mg and Fentanyl 50 mcg was administered intravenously. Moderate Sedation Time: 23 minutes. The patient's level of consciousness and vital signs were monitored continuously by radiology nursing  throughout the procedure under my direct supervision. FLUOROSCOPY TIME:  Fluoroscopy Time: 0.8 minutes (11 mGy) COMPLICATIONS: None immediate. PROCEDURE: Informed written consent was obtained from the patient after a thorough discussion of the procedural risks, benefits and alternatives. All questions were addressed. Maximal Sterile Barrier Technique was utilized including caps, mask, sterile gowns, sterile gloves, sterile drape, hand hygiene and skin antiseptic. A timeout was performed prior to the initiation of the procedure. The patient was placed prone on the exam table. Limited fluoroscopy of the pelvis was performed for planning purposes. Skin entry site was marked, and the overlying skin was prepped and draped in the standard sterile fashion. Local analgesia was obtained with 1% lidocaine. Using fluoroscopic guidance, an 11 gauge needle was advanced just deep to the cortex of the right posterior ilium. Subsequently, bone marrow aspiration and core biopsy were performed. Specimens were submitted to lab/pathology for handling.  Hemostasis was achieved with manual pressure, and a clean dressing was placed. The patient tolerated the procedure well without immediate complication. IMPRESSION: Successful bone marrow aspiration and core biopsy of the right posterior ilium using fluoroscopic guidance. Electronically Signed   By: Olive Bass M.D.   On: 04/26/2023 12:17   NM PET Image Initial (PI) Skull Base To Thigh  Result Date: 04/24/2023 CLINICAL DATA:  Initial treatment strategy for weight loss, monoclonal B-cell lymphocytosis, and right pleural effusion. EXAM: NUCLEAR MEDICINE PET SKULL BASE TO THIGH TECHNIQUE: 7.0 mCi F-18 FDG was injected intravenously. Full-ring PET imaging was performed from the skull base to thigh after the radiotracer. CT data was obtained and used for attenuation correction and anatomic localization. Fasting blood glucose: 119 mg/dl COMPARISON:  CT chest abdomen pelvis dated  04/14/2023 FINDINGS: Mediastinal blood pool activity: SUV max 2.6 Liver activity: SUV max NA NECK: No hypermetabolic lymph nodes in the neck. Incidental CT findings: None. CHEST: No hypermetabolic mediastinal or hilar nodes. No suspicious pulmonary nodules on the CT scan. Moderate right pleural effusion without convincing nodularity or hypermetabolism. Right middle and right lower lobe compressive atelectasis. Incidental CT findings: Atherosclerotic calcifications of the aortic arch. Mitral valve annular calcifications. Mild coronary atherosclerosis of the LAD. ABDOMEN/PELVIS: No abnormal hypermetabolic activity within the liver, pancreas, adrenal glands, or spleen. No hypermetabolic lymph nodes in the abdomen or pelvis. Nodular hepatic contour, suggesting cirrhosis. Mild diffuse hypermetabolism in the gastric antrum, max SUV 5.6, favored to be reactive or less likely reflecting gastritis. However, there is mild polypoid soft tissue prominence at the pylorus on CT (series 6/image 88), equivocal. Consider direct visualization as clinically warranted. Incidental CT findings: Status post cholecystectomy. Atherosclerotic calcifications of the abdominal aorta and branch vessels. Left extrarenal pelvis. Sigmoid diverticulosis, without evidence of diverticulitis. SKELETON: No focal hypermetabolic activity to suggest skeletal metastasis. Incidental CT findings: Status post left shoulder arthroplasty with posttraumatic deformity involving the distal humerus. Degenerative changes of the visualized thoracolumbar spine. IMPRESSION: No findings specific for malignancy. No suspicious lymphadenopathy in this patient with B-cell lymphocytosis. 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 Possible distal gastritis. Equivocal polypoid soft tissue thickening at the pylorus, consider upper endoscopy for direct visualization as clinically warranted. Electronically Signed   By: Charline Bills M.D.   On: 04/24/2023 10:06    DG Chest Port 1 View  Result Date: 04/22/2023 CLINICAL DATA:  Shortness of breath EXAM: PORTABLE CHEST 1 VIEW COMPARISON:  04/21/2023 FINDINGS: Large right pleural effusion with right lower lobe atelectasis or infiltrate, similar to prior study. No confluent opacity on the left. Heart and mediastinal contours within normal limits. Aortic atherosclerosis. No acute bony abnormality. IMPRESSION: Large right pleural effusion with right lower lobe atelectasis or infiltrate, unchanged. Electronically Signed   By: Charlett Nose M.D.   On: 04/22/2023 19:19   DG Chest Port 1 View  Result Date: 04/21/2023 CLINICAL DATA:  Pleural effusion status post thoracentesis. EXAM: PORTABLE CHEST 1 VIEW COMPARISON:  04/20/2023. FINDINGS: Cardiac silhouette is obscured. Large right-sided pleural effusion. Left lung clear. Old healed adjacent left-sided posterior fifth and sixth rib fractures. Calcified aorta. Thoracic degenerative changes. No pneumothorax. IMPRESSION: Large right-sided pleural effusion. Electronically Signed   By: Layla Maw M.D.   On: 04/21/2023 18:16   US THORACENTESIS ASP PLEURAL SPACE W/IMG GUIDE  Result Date: 04/21/2023 INDICATION: Patient with history of COPD, lymphoma and recurrent right pleural effusion. Request for diagnostic and therapeutic right thoracentesis EXAM: ULTRASOUND GUIDED RIGHT THORACENTESIS MEDICATIONS: 8 mL 1% lidocaine COMPLICATIONS:  None immediate. PROCEDURE: An ultrasound guided thoracentesis was thoroughly discussed with the patient and questions answered. The benefits, risks, alternatives and complications were also discussed. The patient understands and wishes to proceed with the procedure. Written consent was obtained. Ultrasound was performed to localize and mark an adequate pocket of fluid in the right chest. Pleural effusion noted to be mildly loculated and the largest pocket was selected for drainage. The area was then prepped and draped in the normal sterile  fashion. 1% Lidocaine was used for local anesthesia. Under ultrasound guidance a 6 Fr Safe-T-Centesis catheter was introduced. Thoracentesis was performed. The catheter was removed and a dressing applied. FINDINGS: A total of approximately 400 mL of serosanguineous fluid was removed. Samples were sent to the laboratory as requested by the clinical team. IMPRESSION: Successful ultrasound guided right thoracentesis yielding 400 mL of pleural fluid. Performed by Lynnette Caffey, PA-C Electronically Signed   By: Irish Lack M.D.   On: 04/21/2023 15:52   DG Chest Port 1 View  Result Date: 04/20/2023 CLINICAL DATA:  Shortness of breath EXAM: PORTABLE CHEST 1 VIEW COMPARISON:  Radiographs 04/12/2023 and PET/CT 04/19/2023 FINDINGS: Moderate-large right pleural effusion and associated airspace opacities similar to PET/CT 04/19/2023 given differences in technique. The left lung is clear. Stable cardiomediastinal silhouette. Aortic atherosclerotic calcification. Loop recorder. Left reverse TSA. IMPRESSION: Moderate-large right pleural effusion and associated airspace opacities. Electronically Signed   By: Minerva Fester M.D.   On: 04/20/2023 22:38   CT L-SPINE NO CHARGE  Result Date: 04/14/2023 CLINICAL DATA:  77 year old female fell, found down. On blood thinners. Head hematoma. EXAM: CT LUMBAR SPINE WITHOUT CONTRAST TECHNIQUE: Technique: Multiplanar CT images of the lumbar spine were reconstructed from contemporary CT of the Abdomen and Pelvis. RADIATION DOSE REDUCTION: This exam was performed according to the departmental dose-optimization program which includes automated exposure control, adjustment of the mA and/or kV according to patient size and/or use of iterative reconstruction technique. CONTRAST:  None COMPARISON:  CT thoracic, CT Chest, Abdomen, and Pelvis today reported separately. FINDINGS: Segmentation: Normal. Alignment: Mild dextroconvex upper and levoconvex lower lumbar scoliosis.  Straightening of lordosis. No significant spondylolisthesis. Vertebrae: Diffuse osteopenia. L1, L2, and L3 vertebrae appear intact. Mild L4 superior endplate compression (sagittal image 64, it is eccentric to the right (coronal image 48). L4 vertebra otherwise intact. L5 vertebra appears intact. Superior sacrum and SI joints appear intact. But there is a subtle, minimally displaced fracture of the lower sacrum in the midline S5 (sagittal image 62). Paraspinal and other soft tissues: Abdomen and pelvis are detailed separately. Lumbar paraspinal soft tissues are within normal limits. Disc levels: Lumbar spine degeneration with multilevel vacuum disc. Moderate to severe disc space loss with disc osteophyte disease L2-L3 through L3-L4. Estimated lumbar spinal stenosis by CT as follows L2-L3: Mild to moderate, and with lateral recess stenosis greater on the left. L3-L4:  Mild-to-moderate. L4-L5:  Moderate to severe (series 2, image 82). IMPRESSION: 1. Osteopenia. Positive for a minimally displaced acute fracture of the lower sacrum at S5. And a mild L4 superior endplate compression fracture is age indeterminate. 2. Advanced lumbar spine disc and endplate degeneration contributing to multilevel spinal stenosis which is moderate to severe at L4-L5 and mild to moderate at L2-L3 and L3-L4. 3. CT Abdomen and Pelvis today reported separately. Electronically Signed   By: Odessa Fleming M.D.   On: 04/14/2023 07:19   CT T-SPINE NO CHARGE  Result Date: 04/14/2023 CLINICAL DATA:  77 year old female fell, found down. On blood thinners.  Head hematoma. EXAM: CT THORACIC SPINE WITHOUT CONTRAST TECHNIQUE: Multiplanar CT images of the thoracic spine were reconstructed from contemporary CT of the Chest. RADIATION DOSE REDUCTION: This exam was performed according to the departmental dose-optimization program which includes automated exposure control, adjustment of the mA and/or kV according to patient size and/or use of iterative  reconstruction technique. CONTRAST:  None COMPARISON:  CT cervical spine, CT Chest, Abdomen, and Pelvis today reported separately. Recent chest CT 04/07/2023. FINDINGS: Limited cervical spine imaging:  Reported separately. Thoracic spine segmentation: Appears normal; hypoplastic left 12th rib. Alignment: Stable thoracic kyphosis from earlier this month. No significant scoliosis or spondylolisthesis. Vertebrae: Chronic osteopenia. Subtle loss of height or compression at T5, T6, T11 is unchanged from earlier this month. No acute thoracic vertebral fracture identified. Visible posterior ribs appear intact. Paraspinal and other soft tissues: Chest and abdomen are detailed separately. Thoracic paraspinal soft tissues remain within normal limits. Disc levels: Thoracic spine degeneration although mostly anterior flowing endplate osteophytes. Partially calcified disc and/or endplate spurring at T10-T11 with evidence of mild chronic thoracic spinal stenosis there. Superimposed facet hypertrophy. Spinal stenosis could be mild or moderate. IMPRESSION: 1. Stable CT appearance of the Thoracic Spine. Osteopenia, No acute traumatic injury identified. 2. 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 3. CT Chest, Abdomen, and Pelvis today reported separately. Electronically Signed   By: Odessa Fleming M.D.   On: 04/14/2023 07:14   CT CHEST ABDOMEN PELVIS WO CONTRAST  Result Date: 04/14/2023 CLINICAL DATA:  77 year old female fell, found down. On blood thinners. Hematoma. EXAM: CT CHEST, ABDOMEN AND PELVIS WITHOUT CONTRAST TECHNIQUE: Multidetector CT imaging of the chest, abdomen and pelvis was performed following the standard protocol without IV contrast. RADIATION DOSE REDUCTION: This exam was performed according to the departmental dose-optimization program which includes automated exposure control, adjustment of the mA and/or kV according to patient size and/or use of iterative reconstruction technique.  COMPARISON:  Thoracic and lumbar CT today reported separately. Recent chest CT 04/07/2023. FINDINGS: CT CHEST FINDINGS Cardiovascular: Calcified aortic atherosclerosis. Calcified coronary artery atherosclerosis. Stable noncontrast appearance of the heart and aorta. No cardiomegaly or pericardial effusion. Mediastinum/Nodes: Stable noncontrast mediastinum. No evidence of mass, lymphadenopathy, mediastinal hematoma. Lungs/Pleura: Layering right pleural effusion with simple fluid density remains moderate to large, slightly decreased compared to 04/07/2023. No contralateral left pleural effusion. Major airways remain patent. Compressive right lung atelectasis. Right lower lobe ventilation has mildly improved. No pneumothorax. Musculoskeletal: Left shoulder arthroplasty. Chronic posttraumatic and/or degenerative changes at the right shoulder. Chronic left rib fractures. Thoracic spine is reported separately. No sternal fracture. CT ABDOMEN PELVIS FINDINGS Hepatobiliary: Nodular liver contour suggestive of cirrhosis (series 2, image 69). No discrete liver lesion in the absence of IV contrast. Cholecystectomy. Pancreas: Negative. Spleen: Negative, no splenomegaly.  No perisplenic fluid. Adrenals/Urinary Tract: Negative noncontrast adrenal glands, kidneys, bladder. Pelvic phleboliths. Stomach/Bowel: Postoperative changes to the right ventral abdominal wall, probably chronic cholecystectomy related. Small fat containing umbilical hernia series 2, image 83. Nearby ventral abdominal wall subcutaneous fat stranding which is probably subcutaneous injection sites (series 2, image 93). No herniated bowel. No dilated large or small bowel. Severe diverticulosis of the sigmoid colon in the pelvis. No active large bowel inflammation. Appendicoliths on series 2, image 93, but otherwise normal appendix (coronal image 44). Decompressed terminal ileum. Decompressed stomach. No free air or free fluid. No mesenteric inflammation identified.  Vascular/Lymphatic: Extensive Aortoiliac calcified atherosclerosis. Vascular patency is not evaluated in the absence of IV contrast. Normal caliber abdominal aorta. No  lymphadenopathy identified. Reproductive: Retroverted, otherwise negative noncontrast appearance. Other: No pelvis free fluid. Musculoskeletal: Lumbar spine is detailed separately. Sacrum, SI joints, pelvis, and proximal femurs appear intact. No superficial soft tissue injury identified. IMPRESSION: 1. No acute traumatic injury identified in the noncontrast chest, abdomen, or pelvis. See CT thoracic and lumbar spine reported separately. 2. Moderate to large layering right pleural effusion has slightly decreased compared to 04/07/2023. Simple fluid density favors transudate (see #3). Compressive right lung atelectasis. 3. Nodular liver contour strongly suggests Cirrhosis. But no ascites, no splenomegaly. 4.  Aortic Atherosclerosis (ICD10-I70.0). Electronically Signed   By: Odessa Fleming M.D.   On: 04/14/2023 07:09   CT Cervical Spine Wo Contrast  Result Date: 04/14/2023 CLINICAL DATA:  77 year old female fell, found down. On blood thinners. Hematoma. EXAM: CT CERVICAL SPINE WITHOUT CONTRAST TECHNIQUE: Multidetector CT imaging of the cervical spine was performed without intravenous contrast. Multiplanar CT image reconstructions were also generated. RADIATION DOSE REDUCTION: This exam was performed according to the departmental dose-optimization program which includes automated exposure control, adjustment of the mA and/or kV according to patient size and/or use of iterative reconstruction technique. COMPARISON:  Head CT today.  Cervical spine CT 03/05/2023. FINDINGS: Alignment: Straightening of cervical lordosis. Cervicothoracic junction alignment is within normal limits. Bilateral posterior element alignment is within normal limits. Skull base and vertebrae: Visualized skull base is intact. No atlanto-occipital dissociation. C1 and C2 appear intact and  aligned. No acute osseous abnormality identified. Soft tissues and spinal canal: No prevertebral fluid or swelling. No visible canal hematoma. Partially retropharyngeal carotids. Calcified atherosclerosis. Mild pharynx motion artifact. Disc levels: Stable cervical spine degeneration. Multilevel disc and endplate degeneration with some ligamentous hypertrophy. 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 Upper chest: Thoracic spine and chest CT today reported separately. IMPRESSION: 1. No acute traumatic injury identified in the cervical spine. 2. 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 Electronically Signed   By: Odessa Fleming M.D.   On: 04/14/2023 06:59   CT HEAD WO CONTRAST ( )  Result Date: 04/14/2023 CLINICAL DATA:  77 year old female fell, found down. On blood thinners. Head hematoma. EXAM: CT HEAD WITHOUT CONTRAST TECHNIQUE: Contiguous axial images were obtained from the base of the skull through the vertex without intravenous contrast. RADIATION DOSE REDUCTION: This exam was performed according to the departmental dose-optimization program which includes automated exposure control, adjustment of the mA and/or kV according to patient size and/or use of iterative reconstruction technique. COMPARISON:  Head CT 03/05/2023. FINDINGS: Brain: Cerebral volume is stable, within normal limits for age. No midline shift, ventriculomegaly, mass effect, evidence of mass lesion, intracranial hemorrhage or evidence of cortically based acute infarction. Stable mild for age patchy white matter hypodensity. Otherwise normal gray-white differentiation. Vascular: Calcified atherosclerosis at the skull base. No suspicious intracranial vascular hyperdensity. Skull: Stable and intact. Sinuses/Orbits: Visualized paranasal sinuses and mastoids are stable and well aerated. Other: There is a mild right posterior convexity scalp hematoma or contusion on series 3, image 35.  Underlying calvarium appears intact. No other acute orbit or scalp soft tissue injury identified. IMPRESSION: 1. Right posterior convexity scalp hematoma or contusion. No skull fracture. 2. No acute intracranial abnormality. Stable and largely negative for age non contrast CT appearance of the brain. Electronically Signed   By: Odessa Fleming M.D.   On: 04/14/2023 06:55   DG Chest Port 1 View  Result Date: 04/12/2023 CLINICAL DATA:  Cough, shortness of breath EXAM: PORTABLE CHEST 1 VIEW COMPARISON:  04/11/2023 FINDINGS: Heart  size is normal. Dense mitral annulus calcifications. Benign calcified granulomatous mediastinal lymph nodes. Implantable loop recorder. No acute airspace opacity. Benign calcified nodule specific status post left shoulder reverse arthroplasty. IMPRESSION: No acute abnormality of the lungs in AP portable projection. Electronically Signed   By: Jearld Lesch M.D.   On: 04/12/2023 09:10   DG Chest Port 1 View  Result Date: 04/11/2023 CLINICAL DATA:  Pleural effusion. EXAM: PORTABLE CHEST 1 VIEW COMPARISON:  April 10, 2023. FINDINGS: The heart size and mediastinal contours are within normal limits. Status post left shoulder arthroplasty. Old left rib fractures are noted. Increased right basilar opacity is noted concerning for worsening effusion and associated atelectasis or infiltrate. Left lung is unremarkable. IMPRESSION: Increased right basilar opacity is noted concerning for worsening effusion and associated atelectasis or infiltrate. Electronically Signed   By: Lupita Raider M.D.   On: 04/11/2023 08:51   ECHOCARDIOGRAM COMPLETE  Result Date: 04/10/2023    ECHOCARDIOGRAM REPORT   Patient Name:   SUHAANI KEMPEL Date of Exam: 04/09/2023 Medical Rec #:  [id number]      Height:       62.0 in Accession #:    [id number]     Weight:       141.5 lb Date of Birth:  07-Apr-1946       BSA:          1.650 m Patient Age:    77 years       BP:           121/76 mmHg Patient Gender: F              HR:            58 bpm. Exam Location:  [facility] Procedure: 2D Echo, Cardiac Doppler and Color Doppler Indications:    DOE R06.0  History:        Patient has prior history of Echocardiogram examinations, most                 recent 05/24/2022. COPD, Arrythmias:Atrial Fibrillation; Risk                 Factors:Hypertension and Diabetes.  Sonographer:    Dondra Prader RVT RCS Referring Phys: [id number] Sunnie Nielsen  Sonographer Comments: Patient unable to tolerate probe pressure on abdomen; no subcostal views. IMPRESSIONS  1. Left ventricular ejection fraction, by estimation, is 55 to 60%. The left ventricle has normal function. The left ventricle has no regional wall motion abnormalities. Left ventricular diastolic parameters are consistent with Grade I diastolic dysfunction (impaired relaxation).  2. Right ventricular systolic function is normal. The right ventricular size is normal. There is normal pulmonary artery systolic pressure.  3. Left atrial size was mildly dilated.  4. The mitral valve is normal in structure. Mild mitral valve regurgitation. Mild to moderate mitral stenosis. The mean mitral valve gradient is 6.0 mmHg. Severe mitral annular calcification.  5. The aortic valve is normal in structure. Aortic valve regurgitation is trivial. No aortic stenosis is present.  6. The inferior vena cava is normal in size with greater than 50% respiratory variability, suggesting right atrial pressure of 3 mmHg. FINDINGS  Left Ventricle: Left ventricular ejection fraction, by estimation, is 55 to 60%. The left ventricle has normal function. The left ventricle has no regional wall motion abnormalities. The left ventricular internal cavity size was normal in size. There is  no left ventricular hypertrophy. Left ventricular diastolic parameters are consistent with Grade I diastolic dysfunction (impaired relaxation).  Right Ventricle: The right ventricular size is normal. No increase in right ventricular wall thickness. Right  ventricular systolic function is normal. There is normal pulmonary artery systolic pressure. The tricuspid regurgitant velocity is 2.31 m/s, and  with an assumed right atrial pressure of 3 mmHg, the estimated right ventricular systolic pressure is 24.3 mmHg. Left Atrium: Left atrial size was mildly dilated. Right Atrium: Right atrial size was normal in size. Pericardium: There is no evidence of pericardial effusion. Mitral Valve: The mitral valve is normal in structure. There is moderate thickening of the mitral valve leaflet(s). There is moderate calcification of the mitral valve leaflet(s). Severe mitral annular calcification. Mild mitral valve regurgitation. Mild  to moderate mitral valve stenosis. The mean mitral valve gradient is 6.0 mmHg. Tricuspid Valve: The tricuspid valve is normal in structure. Tricuspid valve regurgitation is mild . No evidence of tricuspid stenosis. Aortic Valve: The aortic valve is normal in structure. Aortic valve regurgitation is trivial. Aortic regurgitation PHT measures 600 msec. No aortic stenosis is present. Aortic valve mean gradient measures 7.0 mmHg. Aortic valve peak gradient measures 12.7 mmHg. Aortic valve area, by VTI measures 1.18 cm. Pulmonic Valve: The pulmonic valve was normal in structure. Pulmonic valve regurgitation is not visualized. No evidence of pulmonic stenosis. Aorta: The aortic root is normal in size and structure. Venous: The inferior vena cava is normal in size with greater than 50% respiratory variability, suggesting right atrial pressure of 3 mmHg. IAS/Shunts: No atrial level shunt detected by color flow Doppler.  LEFT VENTRICLE PLAX 2D LVIDd:         4.30 cm   Diastology LVIDs:         3.10 cm   LV e' medial:    4.56 cm/s LV PW:         1.00 cm   LV E/e' medial:  24.6 LV IVS:        0.80 cm   LV e' lateral:   4.87 cm/s LVOT diam:     1.40 cm   LV E/e' lateral: 23.0 LV SV:         43 LV SV Index:   26 LVOT Area:     1.54 cm  RIGHT VENTRICLE RV Basal  diam:  2.60 cm RV S prime:     11.30 cm/s TAPSE (M-mode): 1.7 cm LEFT ATRIUM             Index        RIGHT ATRIUM          Index LA diam:        4.40 cm 2.67 cm/m   RA Area:     7.81 cm LA Vol (A2C):   50.9 ml 30.84 ml/m  RA Volume:   13.80 ml 8.36 ml/m LA Vol (A4C):   42.4 ml 25.69 ml/m LA Biplane Vol: 49.3 ml 29.87 ml/m  AORTIC VALVE                     PULMONIC VALVE AV Area (Vmax):    1.37 cm      PV Vmax:       1.06 m/s AV Area (Vmean):   1.26 cm      PV Peak grad:  4.5 mmHg AV Area (VTI):     1.18 cm AV Vmax:           178.00 cm/s AV Vmean:          116.000 cm/s AV VTI:  0.369 m AV Peak Grad:      12.7 mmHg AV Mean Grad:      7.0 mmHg LVOT Vmax:         158.00 cm/s LVOT Vmean:        94.800 cm/s LVOT VTI:          0.282 m LVOT/AV VTI ratio: 0.76 AI PHT:            600 msec  AORTA Ao Root diam: 2.80 cm Ao Asc diam:  3.60 cm MITRAL VALVE                TRICUSPID VALVE MV Area (PHT): 2.07 cm     TR Peak grad:   21.3 mmHg MV Mean grad:  6.0 mmHg     TR Vmax:        231.00 cm/s MV Decel Time: 366 msec MR Peak grad: 11.8 mmHg     SHUNTS MR Mean grad: 6.0 mmHg      Systemic VTI:  0.28 m MR Vmax:      172.00 cm/s   Systemic Diam: 1.40 cm MR Vmean:     116.0 cm/s MV E velocity: 112.00 cm/s MV A velocity: 167.00 cm/s MV E/A ratio:  0.67 Lorine Bears MD Electronically signed by Lorine Bears MD Signature Date/Time: 04/10/2023/5:17:03 PM    Final    DG Chest Port 1 View  Result Date: 04/10/2023 CLINICAL DATA:  142230 Pleural effusion 142230. EXAM: PORTABLE CHEST 1 VIEW COMPARISON:  Chest radiograph 04/09/2023. FINDINGS: Decreased, trace residual right pleural effusion. Increasing airspace opacity in the medial aspect of the right lung base, suspicious for aspiration or developing infection. No pneumothorax. IMPRESSION: Decreased, trace residual right pleural effusion. Increasing airspace opacity in the medial aspect of the right lung base, suspicious for aspiration or developing infection.  Electronically Signed   By: Orvan Falconer M.D.   On: 04/10/2023 12:47   DG Chest Port 1 View  Result Date: 04/09/2023 CLINICAL DATA:  Post thoracentesis EXAM: PORTABLE CHEST 1 VIEW COMPARISON:  CT chest 04/07/2023, chest x-ray 05/22/2022 FINDINGS: Left-sided electronic recording device. Normal cardiac size. Mitral annular calcifications. Left lung grossly clear. Decreased right pleural effusion with small moderate residual pleural effusion. No convincing right pneumothorax. Airspace disease at the right base. Aortic atherosclerosis. Old appearing left-sided rib fractures. Left shoulder replacement IMPRESSION: Decreased right pleural effusion with small to moderate residual pleural effusion. No convincing pneumothorax. Airspace disease at the right base, atelectasis versus pneumonia. Electronically Signed   By: Jasmine Pang M.D.   On: 04/09/2023 16:27   US Abdomen Limited RUQ (LIVER/GB)  Result Date: 04/09/2023 CLINICAL DATA:  Cirrhosis EXAM: ULTRASOUND ABDOMEN LIMITED RIGHT UPPER QUADRANT COMPARISON:  None Available. FINDINGS: Gallbladder: Prior cholecystectomy. Common bile duct: Diameter: 6 mm. Liver: No focal lesion identified. Nodular liver contour. Normal parenchymal echogenicity. Portal vein is patent on color Doppler imaging with normal direction of blood flow towards the liver. Other: Right pleural effusion. IMPRESSION: 1. Cirrhotic liver morphology. 2. Right pleural effusion. Electronically Signed   By: Allegra Lai M.D.   On: 04/09/2023 12:59   DG Foot Complete Right  Result Date: 04/08/2023 CLINICAL DATA:  Right foot pain EXAM: RIGHT FOOT COMPLETE - 3+ VIEW COMPARISON:  None Available. FINDINGS: No fracture or dislocation is seen. Mild degenerative changes of the 1st MCP joint. Degenerative changes of the dorsal midfoot. Moderate posterior and plantar enthesophytes. IMPRESSION: No fracture or dislocation is seen. Degenerative changes, as above. Electronically Signed   By: Lurlean Horns  Rito Ehrlich M.D.   On: 04/08/2023 01:55   CT CHEST WO CONTRAST  Result Date: 04/07/2023 CLINICAL DATA:  Chest pain, shortness of breath EXAM: CT CHEST WITHOUT CONTRAST TECHNIQUE: Multidetector CT imaging of the chest was performed following the standard protocol without IV contrast. RADIATION DOSE REDUCTION: This exam was performed according to the departmental dose-optimization program which includes automated exposure control, adjustment of the mA and/or kV according to patient size and/or use of iterative reconstruction technique. COMPARISON:  CT 05/23/2022 FINDINGS: Cardiovascular: Heart size is upper limits of normal. No pericardial effusion. Thoracic aorta is nonaneurysmal. There are atherosclerotic vascular calcifications of the aorta and coronary arteries. Calcification of the mitral annulus. Central pulmonary vasculature is nondilated. Mediastinum/Nodes: No enlarged mediastinal or axillary lymph nodes. Thyroid gland, trachea, and esophagus demonstrate no significant findings. Small hiatal hernia. Lungs/Pleura: Moderate-large layering right-sided pleural effusion with complete atelectasis of the right lower lobe and near-complete atelectasis of the right middle lobe. Partial compressive atelectasis of the right upper lobe. Mild mosaic attenuation of the aerated portion of the right upper lobe and within the left lung. No left-sided pleural effusion. No pneumothorax. Upper Abdomen: Nodular hepatic surface contour. No acute abnormality. Musculoskeletal: No chest wall mass or suspicious bone lesions identified. Left chest wall loop recorder. IMPRESSION: 1. Moderate-large layering right-sided pleural effusion with complete atelectasis of the right lower lobe and near-complete atelectasis of the right middle lobe. Partial compressive atelectasis of the right upper lobe. 2. Mild mosaic attenuation of the aerated portion of the right upper lobe and within the left lung, which may be related to small airways  disease. 3. Nodular hepatic surface contour, suggestive of cirrhosis. 4. Aortic and coronary artery atherosclerosis (ICD10-I70.0). Electronically Signed   By: Duanne Guess D.O.   On: 04/07/2023 15:25

## 2023-05-03 NOTE — Discharge Instructions (Signed)
Instructions reviewed with patient. [facility]

## 2023-05-03 NOTE — Procedures (Signed)
PROCEDURE SUMMARY:  Successful US guided right thoracentesis. Yielded of amber fluid. Pt tolerated procedure well. No immediate complications.  Specimen was sent for labs. CXR ordered.  EBL < 5 mL  Hoyt Koch PA-C 05/03/2023 1:45 PM

## 2023-05-03 NOTE — Assessment & Plan Note (Signed)
Encourage oral hydration and avoid nephrotoxins.

## 2023-05-03 NOTE — Assessment & Plan Note (Signed)
See above plan.

## 2023-05-05 ENCOUNTER — Encounter (HOSPITAL_COMMUNITY): Payer: Self-pay

## 2023-05-09 NOTE — Telephone Encounter (Signed)
Kyia with [facility] called back to cancel the appt because the patient's PCP will not authorize the appt.

## 2023-05-09 NOTE — Telephone Encounter (Signed)
[name] scheduled the patient for 12/23, but she said to check with her 1 week before appt to make sure that it was approved by pt's provider.

## 2023-05-10 NOTE — Progress Notes (Signed)
Patient was contacted and declined [facility] health services on 05/09/2023. Patient stated 'I do not need any of this". Patient is welcome at [facility] if she is interested in the future.

## 2023-05-18 NOTE — Progress Notes (Unsigned)
[address]       [place] [address]             [phone]                    [name] [facility] Medical Record #[id number] Date of Birth: 01-Dec-1945  Referring: Darlin Priestly, MD Primary Care: [facility] Primary Cardiologist: None  Chief Complaint:   No chief complaint on file.   History of Present Illness:    [name] 77 y.o. 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 Bone marrow negative for lymphoma involvement.  Peripheral blood flow cytometry is negative.   Clinically it does not appear that she has disseminated lymphoma disease. 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 She has appt with thoracic surgery Dr. Cliffton Asters on 12/13.  Recommend thoracentesis PRN  Recommend pleural catheter placement.    Past Medical History:  Diagnosis Date   Anemia    Asthma    COPD (chronic obstructive pulmonary disease) (HCC)    Diabetes mellitus without complication (HCC)    type 2   DVT (deep venous thrombosis) (HCC) 1980   GERD (gastroesophageal reflux disease)    History of hiatal hernia    Hypertension    Hypothyroidism     Past Surgical History:  Procedure Laterality Date   CATARACT EXTRACTION Bilateral    CHOLECYSTECTOMY     COLONOSCOPY     ESOPHAGOGASTRODUODENOSCOPY (EGD) WITH PROPOFOL N/A 01/17/2022   Procedure: ESOPHAGOGASTRODUODENOSCOPY (EGD) WITH PROPOFOL;  Surgeon: Jaynie Collins, DO;  Location: [facility] ENDOSCOPY;  Service: Gastroenterology;  Laterality: N/A;   HUMERUS SURGERY Left    fracture (7 surgeries total)   IR BONE MARROW BIOPSY & ASPIRATION  04/26/2023   MENISCUS REPAIR Right 2019   THYROIDECTOMY     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;  reconstruction from hip bone   TONSILECTOMY, ADENOIDECTOMY, BILATERAL MYRINGOTOMY AND TUBES     TOTAL KNEE ARTHROPLASTY Right 03/14/2022   Procedure: TOTAL KNEE ARTHROPLASTY;  Surgeon: Reinaldo Berber, MD;  Location: [facility] ORS;  Service: Orthopedics;  Laterality: Right;   TOTAL SHOULDER REPLACEMENT Left    TUBAL LIGATION     WRIST SURGERY Left    multiple fractures from falls (14 surgeries from wrist to shoulder)    Family History  Problem Relation Age of Onset   Kidney disease Mother    Tuberculosis Father      Social History   Tobacco Use  Smoking Status Never  Smokeless Tobacco Never    Social History   Substance and Sexual Activity  Alcohol Use Never     Allergies  Allergen Reactions   Cephalosporins Itching   Codeine Other (See Comments)    Extreme drowsiness   Contrast Media [Iodinated Contrast Media] Hives and Swelling    Facial swelling   Oxycodone Other (See Comments)    Extreme drowsiness    Current Outpatient Medications  Medication Sig Dispense Refill   acetaminophen (TYLENOL) 325 MG tablet Take 1,300 mg by mouth every 6 (six) hours as needed for moderate pain.     ADVAIR HFA 115-21 MCG/ACT inhaler Inhale 2 puffs into the lungs 2 (two) times daily.     albuterol (VENTOLIN HFA)  108 (90 Base) MCG/ACT inhaler Inhale 2-4 puffs by mouth every 4 hours as needed for wheezing, cough, and/or shortness of breath 6.7 g 0   ALPRAZolam (XANAX) 0.25 MG tablet Take 1 tablet (0.25 mg total) by mouth 2 (two) times daily as needed for anxiety. 20 tablet 0   amiodarone (PACERONE) 100 MG tablet Take 1 tablet (100 mg total) by mouth daily. 30 tablet 0   apixaban (ELIQUIS) 2.5 MG TABS tablet Take 1 tablet (2.5 mg total) by mouth 2 (two) times daily. 60 tablet 1   Ascorbic Acid (VITAMIN C CR) 1500 MG TBCR Take 1 tablet by mouth daily.     bismuth subsalicylate (PEPTO BISMOL) 262 MG/15ML suspension Take 30 mLs by mouth every 6 (six) hours as needed for indigestion or diarrhea or  loose stools.     Carboxymeth-Glycerin-Polysorb (REFRESH OPTIVE ADVANCED) 0.5-1-0.5 % SOLN Apply 2 drops to eye daily.     diclofenac Sodium (VOLTAREN) 1 % GEL Apply 2 g topically 4 (four) times daily as needed.     Emollient (EUCERIN) lotion Apply 1 Application topically as needed for dry skin.     escitalopram (LEXAPRO) 10 MG tablet Take 1 tablet (10 mg total) by mouth daily.     esomeprazole (NEXIUM) 40 MG capsule Take 40 mg by mouth 2 (two) times daily.     feeding supplement (ENSURE ENLIVE / ENSURE PLUS) LIQD Take 237 mLs by mouth 3 (three) times daily between meals.     ferrous sulfate 325 (65 FE) MG tablet Take 325 mg by mouth daily.     fluticasone (FLONASE) 50 MCG/ACT nasal spray Place 2 sprays into both nostrils daily as needed for allergies or rhinitis.     furosemide (LASIX) 20 MG tablet Take 2 tablets (40 mg total) by mouth daily.     guaiFENesin (MUCINEX) 600 MG 12 hr tablet Take 600 mg by mouth 2 (two) times daily as needed for to loosen phlegm or cough.     ipratropium-albuterol (DUONEB) 0.5-2.5 (3) MG/3ML SOLN Take 3 mLs by nebulization every 4 (four) hours as needed.     ketoconazole (NIZORAL) 2 % shampoo Apply 1 Application topically 2 (two) times a week.     ketotifen (ZADITOR) 0.025 % ophthalmic solution Place 1 drop into both eyes daily as needed.     levothyroxine (SYNTHROID) 125 MCG tablet Take 125 mcg by mouth daily before breakfast.     loratadine (CLARITIN) 10 MG tablet Take 1 tablet (10 mg total) by mouth daily as needed for allergies, rhinitis or itching. Home med.     Magnesium 250 MG TABS Take 1 tablet by mouth daily.     Multiple Vitamin (MULTIVITAMIN WITH MINERALS) TABS tablet Take 1 tablet by mouth daily.     nitroGLYCERIN (NITROSTAT) 0.4 MG SL tablet Place 0.4 mg under the tongue every 5 (five) minutes as needed for chest pain.     olopatadine (PATANOL) 0.1 % ophthalmic solution Place 1 drop into both eyes daily as needed for allergies.     Omega-3 1000 MG CAPS  Take 1 capsule by mouth daily.     ondansetron (ZOFRAN) 4 MG tablet Take 4 mg by mouth every 8 (eight) hours as needed for nausea or vomiting.     OXYGEN Inhale 2 L into the lungs at bedtime.     rOPINIRole (REQUIP) 0.25 MG tablet Take 0.25 mg by mouth at bedtime.     sodium chloride (OCEAN) 0.65 % SOLN nasal spray Place 1 spray into  both nostrils as needed for congestion.  0   SYSTANE ULTRA 0.4-0.3 % SOLN Apply 2 drops to eye QID.     traZODone (DESYREL) 50 MG tablet Take 50 mg by mouth at bedtime as needed for sleep.     TURMERIC PO Take 500 mg by mouth daily.     VITAMIN D-VITAMIN K PO Take 45 mcg by mouth daily.     Zinc Oxide 10 % OINT Apply 1 application  topically 3 (three) times daily as needed.     No current facility-administered medications for this visit.    ROS  PHYSICAL EXAMINATION: There were no vitals taken for this visit.  Physical Exam   Diagnostic Studies & Laboratory data:     Recent Radiology Findings:   DG Chest Port 1 View Result Date: 05/03/2023 CLINICAL DATA:  Post thoracentesis EXAM: PORTABLE CHEST 1 VIEW COMPARISON:  X-ray 04/29/2023 and older FINDINGS: Left shoulder reverse arthroplasty seen. Loop recorder. Normal cardiopericardial silhouette with calcified aorta. Prominent calcifications along the mitral valve annulus. Decreasing tiny right pleural effusion. No pneumothorax or consolidation. No edema. Surgical clips in the right upper quadrant. Degenerative changes of the spine. IMPRESSION: Decreasing right pleural effusion. No pneumothorax post thoracentesis. Electronically Signed   By: Karen Kays M.D.   On: 05/03/2023 15:02   US THORACENTESIS ASP PLEURAL SPACE W/IMG GUIDE Result Date: 05/03/2023 INDICATION: 77 year old female with history of COPD, lymphoma with recurrent symptomatic right pleural effusion previous thoracentesis have been helpful with improvement in breathing. Request received for therapeutic right thoracentesis. EXAM: ULTRASOUND GUIDED  RIGHT THORACENTESIS MEDICATIONS: 10 mL 1% lidocaine COMPLICATIONS: None immediate. PROCEDURE: An ultrasound guided thoracentesis was thoroughly discussed with the patient and questions answered. The benefits, risks, alternatives and complications were also discussed. The patient understands and wishes to proceed with the procedure. Written consent was obtained. Ultrasound was performed to localize and mark an adequate pocket of fluid in the right chest. The area was then prepped and draped in the normal sterile fashion. 1% Lidocaine was used for local anesthesia. Under ultrasound guidance a 6 Fr Safe-T-Centesis catheter was introduced. Thoracentesis was performed. The catheter was removed and a dressing applied. FINDINGS: A total of approximately 500 mL of amber fluid was removed. IMPRESSION: Successful ultrasound guided right thoracentesis yielding 500 mL of pleural fluid. Performed by: Loyce Dys PA-C Electronically Signed   By: Judie Petit.  Shick M.D.   On: 05/03/2023 13:47   DG Chest Portable 1 View Result Date: 04/29/2023 CLINICAL DATA:  Shortness of breath. EXAM: PORTABLE CHEST 1 VIEW COMPARISON:  04/27/2023. FINDINGS: There is small right pleural effusion, decreased since the prior study. Bilateral lung fields are clear. No acute consolidation or lung collapse. Left lateral costophrenic angle is clear. No pneumothorax on either side. Stable cardio-mediastinal silhouette. Presumed loop recorder device noted overlying the left lower chest wall. No acute osseous abnormalities. Old healed fractures of posterior left fourth through seventh ribs noted. Left shoulder reverse arthroplasty noted. The soft tissues are within normal limits. IMPRESSION: *Small right pleural effusion, decreased since the prior study. No pneumothorax. Electronically Signed   By: Jules Schick M.D.   On: 04/29/2023 14:12   DG Chest Port 1 View Result Date: 04/27/2023 CLINICAL DATA:  Right pleural effusion, status post thoracentesis  EXAM: PORTABLE CHEST 1 VIEW COMPARISON:  04/27/2023 12:46 p.m. FINDINGS: Substantially reduced size of the right pleural effusion. No pneumothorax. There is some residual indistinct airspace opacity at the right lung base probably from atelectasis, along with some  residual blunting of the right lateral costophrenic angle. Loop recorder and left shoulder reverse arthroplasty again noted. Bony demineralization. Old left rib fractures. IMPRESSION: 1. Substantially reduced size of the right pleural effusion, without pneumothorax. 2. Residual indistinct airspace opacity at the right lung base probably from atelectasis. Blunting the right lateral costophrenic angle indicating some degree of residual pleural effusion. 3. Bony demineralization. Electronically Signed   By: Gaylyn Rong M.D.   On: 04/27/2023 17:07   DG Chest 2 View Result Date: 04/27/2023 CLINICAL DATA:  Pleural effusions EXAM: CHEST - 2 VIEW COMPARISON:  04/22/2023 FINDINGS: Normal cardiac silhouette. Moderate RIGHT effusion improved slightly from comparison exam. No pneumothorax. LEFT lung clear. LEFT shoulder arthroplasty IMPRESSION: Slight improvement in moderate RIGHT effusion. Electronically Signed   By: Genevive Bi M.D.   On: 04/27/2023 16:34   US THORACENTESIS ASP PLEURAL SPACE W/IMG GUIDE Result Date: 04/27/2023 INDICATION: Patient with history of COPD, lymphoma with recurrent symptomatic right pleural effusion previous thoracentesis have been helpful with improvement in breathing. Request received for therapeutic right thoracentesis. EXAM: ULTRASOUND GUIDED RIGHT THORACENTESIS MEDICATIONS: Local 1% lidocaine only. COMPLICATIONS: None immediate. PROCEDURE: An ultrasound guided thoracentesis was thoroughly discussed with the patient and questions answered. The benefits, risks, alternatives and complications were also discussed. The patient understands and wishes to proceed with the procedure. Written consent was obtained. Ultrasound  was performed to localize and mark an adequate pocket of fluid in the right chest. The area was then prepped and draped in the normal sterile fashion. 1% Lidocaine was used for local anesthesia. Under ultrasound guidance a 19 gauge, 7-cm, Yueh catheter was introduced. Thoracentesis was performed. The catheter was removed and a dressing applied. FINDINGS: A total of approximately 1.1 L of amber/bloody fluid was removed. Samples were sent to the laboratory as requested by the clinical team. IMPRESSION: Successful ultrasound guided right thoracentesis yielding 1.1 L of pleural fluid. This exam was performed by Pattricia Boss PA-C, and was supervised and interpreted by Dr. Fredia Sorrow. Electronically Signed   By: Irish Lack M.D.   On: 04/27/2023 15:43   IR BONE MARROW BIOPSY & ASPIRATION Result Date: 04/26/2023 INDICATION: Lymphoma EXAM: Bone marrow aspiration and core biopsy using fluoroscopic guidance MEDICATIONS: None. ANESTHESIA/SEDATION: Moderate (conscious) sedation was employed during this procedure. A total of Versed 1.5 mg and Fentanyl 50 mcg was administered intravenously. Moderate Sedation Time: 23 minutes. The patient's level of consciousness and vital signs were monitored continuously by radiology nursing throughout the procedure under my direct supervision. FLUOROSCOPY TIME:  Fluoroscopy Time: 0.8 minutes (11 mGy) COMPLICATIONS: None immediate. PROCEDURE: Informed written consent was obtained from the patient after a thorough discussion of the procedural risks, benefits and alternatives. All questions were addressed. Maximal Sterile Barrier Technique was utilized including caps, mask, sterile gowns, sterile gloves, sterile drape, hand hygiene and skin antiseptic. A timeout was performed prior to the initiation of the procedure. The patient was placed prone on the exam table. Limited fluoroscopy of the pelvis was performed for planning purposes. Skin entry site was marked, and the overlying skin was  prepped and draped in the standard sterile fashion. Local analgesia was obtained with 1% lidocaine. Using fluoroscopic guidance, an 11 gauge needle was advanced just deep to the cortex of the right posterior ilium. Subsequently, bone marrow aspiration and core biopsy were performed. Specimens were submitted to lab/pathology for handling. Hemostasis was achieved with manual pressure, and a clean dressing was placed. The patient tolerated the procedure well without immediate complication. IMPRESSION: Successful bone marrow  aspiration and core biopsy of the right posterior ilium using fluoroscopic guidance. Electronically Signed   By: Olive Bass M.D.   On: 04/26/2023 12:17   NM PET Image Initial (PI) Skull Base To Thigh Result Date: 04/24/2023 CLINICAL DATA:  Initial treatment strategy for weight loss, monoclonal B-cell lymphocytosis, and right pleural effusion. EXAM: NUCLEAR MEDICINE PET SKULL BASE TO THIGH TECHNIQUE: 7.0 mCi F-18 FDG was injected intravenously. Full-ring PET imaging was performed from the skull base to thigh after the radiotracer. CT data was obtained and used for attenuation correction and anatomic localization. Fasting blood glucose: 119 mg/dl COMPARISON:  CT chest abdomen pelvis dated 04/14/2023 FINDINGS: Mediastinal blood pool activity: SUV max 2.6 Liver activity: SUV max NA NECK: No hypermetabolic lymph nodes in the neck. Incidental CT findings: None. CHEST: No hypermetabolic mediastinal or hilar nodes. No suspicious pulmonary nodules on the CT scan. Moderate right pleural effusion without convincing nodularity or hypermetabolism. Right middle and right lower lobe compressive atelectasis. Incidental CT findings: Atherosclerotic calcifications of the aortic arch. Mitral valve annular calcifications. Mild coronary atherosclerosis of the LAD. ABDOMEN/PELVIS: No abnormal hypermetabolic activity within the liver, pancreas, adrenal glands, or spleen. No hypermetabolic lymph nodes in the  abdomen or pelvis. Nodular hepatic contour, suggesting cirrhosis. Mild diffuse hypermetabolism in the gastric antrum, max SUV 5.6, favored to be reactive or less likely reflecting gastritis. However, there is mild polypoid soft tissue prominence at the pylorus on CT (series 6/image 88), equivocal. Consider direct visualization as clinically warranted. Incidental CT findings: Status post cholecystectomy. Atherosclerotic calcifications of the abdominal aorta and branch vessels. Left extrarenal pelvis. Sigmoid diverticulosis, without evidence of diverticulitis. SKELETON: No focal hypermetabolic activity to suggest skeletal metastasis. Incidental CT findings: Status post left shoulder arthroplasty with posttraumatic deformity involving the distal humerus. Degenerative changes of the visualized thoracolumbar spine. IMPRESSION: No findings specific for malignancy. No suspicious lymphadenopathy in this patient with B-cell lymphocytosis. 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 Possible distal gastritis. Equivocal polypoid soft tissue thickening at the pylorus, consider upper endoscopy for direct visualization as clinically warranted. Electronically Signed   By: Charline Bills M.D.   On: 04/24/2023 10:06   DG Chest Port 1 View Result Date: 04/22/2023 CLINICAL DATA:  Shortness of breath EXAM: PORTABLE CHEST 1 VIEW COMPARISON:  04/21/2023 FINDINGS: Large right pleural effusion with right lower lobe atelectasis or infiltrate, similar to prior study. No confluent opacity on the left. Heart and mediastinal contours within normal limits. Aortic atherosclerosis. No acute bony abnormality. IMPRESSION: Large right pleural effusion with right lower lobe atelectasis or infiltrate, unchanged. Electronically Signed   By: Charlett Nose M.D.   On: 04/22/2023 19:19   DG Chest Port 1 View Result Date: 04/21/2023 CLINICAL DATA:  Pleural effusion status post thoracentesis. EXAM: PORTABLE CHEST 1 VIEW COMPARISON:   04/20/2023. FINDINGS: Cardiac silhouette is obscured. Large right-sided pleural effusion. Left lung clear. Old healed adjacent left-sided posterior fifth and sixth rib fractures. Calcified aorta. Thoracic degenerative changes. No pneumothorax. IMPRESSION: Large right-sided pleural effusion. Electronically Signed   By: Layla Maw M.D.   On: 04/21/2023 18:16   US THORACENTESIS ASP PLEURAL SPACE W/IMG GUIDE Result Date: 04/21/2023 INDICATION: Patient with history of COPD, lymphoma and recurrent right pleural effusion. Request for diagnostic and therapeutic right thoracentesis EXAM: ULTRASOUND GUIDED RIGHT THORACENTESIS MEDICATIONS: 8 mL 1% lidocaine COMPLICATIONS: None immediate. PROCEDURE: An ultrasound guided thoracentesis was thoroughly discussed with the patient and questions answered. The benefits, risks, alternatives and complications were also discussed. The patient understands and  wishes to proceed with the procedure. Written consent was obtained. Ultrasound was performed to localize and mark an adequate pocket of fluid in the right chest. Pleural effusion noted to be mildly loculated and the largest pocket was selected for drainage. The area was then prepped and draped in the normal sterile fashion. 1% Lidocaine was used for local anesthesia. Under ultrasound guidance a 6 Fr Safe-T-Centesis catheter was introduced. Thoracentesis was performed. The catheter was removed and a dressing applied. FINDINGS: A total of approximately 400 mL of serosanguineous fluid was removed. Samples were sent to the laboratory as requested by the clinical team. IMPRESSION: Successful ultrasound guided right thoracentesis yielding 400 mL of pleural fluid. Performed by Lynnette Caffey, PA-C Electronically Signed   By: Irish Lack M.D.   On: 04/21/2023 15:52   DG Chest Port 1 View Result Date: 04/20/2023 CLINICAL DATA:  Shortness of breath EXAM: PORTABLE CHEST 1 VIEW COMPARISON:  Radiographs 04/12/2023 and PET/CT  04/19/2023 FINDINGS: Moderate-large right pleural effusion and associated airspace opacities similar to PET/CT 04/19/2023 given differences in technique. The left lung is clear. Stable cardiomediastinal silhouette. Aortic atherosclerotic calcification. Loop recorder. Left reverse TSA. IMPRESSION: Moderate-large right pleural effusion and associated airspace opacities. Electronically Signed   By: Minerva Fester M.D.   On: 04/20/2023 22:38       I have independently reviewed the above radiology studies  and reviewed the findings with the patient.   Recent Lab Findings: Lab Results  Component Value Date   WBC 11.0 (H) 04/29/2023   HGB 12.2 04/29/2023   HCT 37.8 04/29/2023   PLT 284 04/29/2023   GLUCOSE 103 (H) 04/29/2023   CHOL 186 05/23/2022   TRIG 154 (H) 05/23/2022   HDL 32 (L) 05/23/2022   LDLCALC 123 (H) 05/23/2022   ALT 22 04/29/2023   AST 29 04/29/2023   NA 135 04/29/2023   K 3.6 04/29/2023   CL 93 (L) 04/29/2023   CREATININE 0.89 04/29/2023   BUN 41 (H) 04/29/2023   CO2 31 04/29/2023   TSH 3.119 05/22/2022   INR 1.7 (H) 04/07/2023   HGBA1C 7.4 (H) 05/22/2022       Problem List: ***  Assessment / Plan:   ***   R VATS, pleural biopsy   [name] 05/18/2023 4:15 PM

## 2023-05-19 ENCOUNTER — Encounter: Payer: Medicare (Managed Care) | Admitting: Thoracic Surgery (Cardiothoracic Vascular Surgery)

## 2023-05-22 ENCOUNTER — Telehealth: Payer: Self-pay

## 2023-05-22 NOTE — Telephone Encounter (Signed)
Error

## 2023-05-23 ENCOUNTER — Ambulatory Visit
Admission: RE | Admit: 2023-05-23 | Discharge: 2023-05-23 | Disposition: A | Discharge status: Home / Self Care | Payer: Medicare (Managed Care) | Source: Ambulatory Visit | Attending: Pulmonary Disease | Admitting: Pulmonary Disease

## 2023-05-23 ENCOUNTER — Ambulatory Visit: Payer: Medicare (Managed Care) | Admitting: Pulmonary Disease

## 2023-05-23 ENCOUNTER — Encounter: Payer: Self-pay | Admitting: Pulmonary Disease

## 2023-05-23 VITALS — BP 130/64 | HR 79 | Temp 97.6°F | Ht 62.0 in | Wt 142.0 lb

## 2023-05-23 DIAGNOSIS — J90 Pleural effusion, not elsewhere classified: Secondary | ICD-10-CM | POA: Diagnosis present

## 2023-05-23 LAB — FUNGUS CULTURE WITH STAIN

## 2023-05-23 LAB — FUNGUS CULTURE RESULT

## 2023-05-23 LAB — FUNGAL ORGANISM REFLEX

## 2023-05-23 NOTE — Progress Notes (Signed)
Synopsis: Referred in by [facility] Service*   Subjective:   PATIENT ID: [name] GENDER: female DOB: 03-27-1946, MRN: [id number]  Chief Complaint  Patient presents with   Follow-up    No cough,shortness of breath or wheezing.     HPI [name] is a 77 year old female patient with a past medical history of paroxysmal A-fib on Eliquis, asthma, hypertension, hypothyroidism, history of DVT who is presenting today to the pulmonary office for follow-up on pleural effusion.  She presented initially to [facility] in November for shortness of breath, night sweats, unintentional weight loss and loss of appetite.  She was found to have a large right pleural effusion.  She underwent a thoracentesis on 11/3 which revealed an exudative effusion with lymphocyte predominance.  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 Flow cytometry was sent which was consistent with a CD10 positive monoclonal B-cell population.  this prompted a bone marrow biopsy on 11/20 which did not show any CD34 positive blastic population nor monoclonal B-cell population or abnormal T-cell.  Blood flow cytometry was also negative.  CT abdomen pelvis 11/8 nodular liver contour strongly suggestive of cirrhosis.  CT chest 11/8 moderate to large layering right pleural effusion.  No mediastinal lymphadenopathy.  No other suspicious lesions.  PET scan 11/13 -no specific findings for malignancy.  Redemonstrated liver cirrhosis and moderate right pleural effusion.  Echocardiogram 11/03 -LVEF 65 to 70%.  Normal RV function.  No significant valvular disease.  She has had 4 ultrasound guided thoracentesis so far. Seen by Dr. Cathie Hoops and suspicion for pleural lymphoma. Pending thoracic surgery evaluation.   Family history -no family history of pulmonary disease.  Social history -never smoker, denies alcohol use and denies any illicit drug use.  ROS All systems were reviewed  and are negative except for the above.  Objective:   Vitals:   05/23/23 0827  BP: 130/64  Pulse: 79  Temp: 97.6 F (36.4 C)  TempSrc: Temporal  SpO2: 94%  Weight: 142 lb (64.4 kg)  Height: 5\' 2"  (1.575 m)   94% on RA BMI Readings from Last 3 Encounters:  05/23/23 25.97 kg/m  05/03/23 24.69 kg/m  04/29/23 25.77 kg/m   Wt Readings from Last 3 Encounters:  05/23/23 142 lb (64.4 kg)  05/03/23 135 lb (61.2 kg)  04/29/23 140 lb 14 oz (63.9 kg)    Physical Exam GEN: NAD, Healthy Appearing HEENT: Supple Neck, Reactive Pupils, EOMI  CVS: Normal S1, Normal S2, RRR, No murmurs or ES appreciated  Lungs: Bibasilar crackles noted. Expiratory wheezing also noted.  Abdomen: Soft, non tender, non distended, + BS  Extremities: Warm and well perfused, No edema  Skin: No suspicious lesions appreciated  Psych: Normal Affect  Labs and imaging were reviewed.  Ancillary Information   CBC    Component Value Date/Time   WBC 11.0 (H) 04/29/2023 1349   RBC 4.05 04/29/2023 1349   HGB 12.2 04/29/2023 1349   HCT 37.8 04/29/2023 1349   PLT 284 04/29/2023 1349   MCV 93.3 04/29/2023 1349   MCH 30.1 04/29/2023 1349   MCHC 32.3 04/29/2023 1349   RDW 13.4 04/29/2023 1349   LYMPHSABS 1.8 04/29/2023 1349   MONOABS 0.8 04/29/2023 1349   EOSABS 0.1 04/29/2023 1349   BASOSABS 0.0 04/29/2023 1349       No data to display           Assessment & Plan:  [name] is a 77 year old female patient with a past  medical history of paroxysmal A-fib on Eliquis, asthma, hypertension, hypothyroidism, history of DVT who is presenting today to the pulmonary office for follow-up on pleural effusion.  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 #Liver cirrhosis #Severe persistent asthma   With the presentation of recurrent effusion, B symptoms and flow cytometry with CD10  positive monoclonal B-cell population this is concerning for underlying B-cell lymphoma.  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  Fluid recurrence could be impacted by liver cirrhosis as well.  Ultrasound in office shows recurrence of right pleural effusion, anechoic noncomplex.  Ordered chest x-ray.  She is pending thoracic evaluation on Friday for pleural biopsy and IPC placement.  Furthermore she is complaining of worsening dyspnea and does have extensive wheezing tells me this is longstanding.  PFTs were ordered however she would like to finish evaluation for pleural lymphoma first before tackling other problems.  She is currently on Advair discus and nebulizer as needed.  Finally with her underlying liver cirrhosis 1 would think of hepatopulmonary syndrome and will obtain an echocardiogram with bubble study in the future.  Return in about 3 months (around 08/21/2023).  I spent 60 minutes caring for this patient today, including preparing to see the patient, obtaining a medical history , reviewing a separately obtained history, performing a medically appropriate examination and/or evaluation, counseling and educating the patient/family/caregiver, ordering medications, tests, or procedures, documenting clinical information in the electronic health record, and independently interpreting results (not separately reported/billed) and communicating results to the patient/family/caregiver  Janann Colonel, MD [facility] 05/23/2023 9:31 AM

## 2023-05-24 ENCOUNTER — Telehealth: Payer: Self-pay | Admitting: *Deleted

## 2023-05-24 NOTE — Telephone Encounter (Signed)
[facility] called stating that they are closing the referral sent on this patient because it needs to go to [facility] as she is already established there.

## 2023-05-24 NOTE — H&P (View-Only) (Signed)
[address]       [place] [address]             [phone]                    [name] Medical Record #[id number] Date of Birth: 1945/12/07  Referring: Darlin Priestly, MD Primary Care: [facility] Primary Cardiologist: None  Chief Complaint:    Chief Complaint  Patient presents with   Pleural Effusion    CT CAP 11/8, PET 11/13, Thoracentesis x2     History of Present Illness:    [name] 77 y.o. female presents for surgical evaluation of her recurrent right-sided pleural effusion.  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  She currently has minimal respiratory symptoms.    Past Medical History:  Diagnosis Date   A-fib (HCC)    Anemia    Anxiety    Arthritis    Asthma    Cirrhosis (HCC)    COPD (chronic obstructive pulmonary disease) (HCC)    Diabetes mellitus without complication (HCC)    type 2   DVT (deep venous thrombosis) (HCC) 1980   GERD (gastroesophageal reflux disease)    History of hiatal hernia    Hypertension    Hypothyroidism    Lymphoma (HCC)     Past Surgical History:  Procedure Laterality Date   CATARACT EXTRACTION Bilateral    CHOLECYSTECTOMY     COLONOSCOPY     ESOPHAGOGASTRODUODENOSCOPY (EGD) WITH PROPOFOL N/A 01/17/2022   Procedure: ESOPHAGOGASTRODUODENOSCOPY (EGD) WITH PROPOFOL;  Surgeon: Jaynie Collins, DO;  Location: [facility] ENDOSCOPY;  Service: Gastroenterology;  Laterality: N/A;   HUMERUS SURGERY Left    fracture (7 surgeries total)   IR BONE MARROW BIOPSY & ASPIRATION  04/26/2023   LOOP RECORDER INSERTION  Jun 16, 2017   pt states battery died, not transmitting, just there   MENISCUS REPAIR Right 06/16/2018   THYROIDECTOMY     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; reconstruction from hip bone   TONSILECTOMY, ADENOIDECTOMY, BILATERAL MYRINGOTOMY AND TUBES     TOTAL KNEE ARTHROPLASTY Right 03/14/2022   Procedure: TOTAL KNEE ARTHROPLASTY;  Surgeon:  Reinaldo Berber, MD;  Location: [facility] ORS;  Service: Orthopedics;  Laterality: Right;   TOTAL SHOULDER REPLACEMENT Left    TUBAL LIGATION     WRIST SURGERY Left    multiple fractures from falls (14 surgeries from wrist to shoulder)    Family History  Problem Relation Age of Onset   Kidney disease Mother    Tuberculosis Father      Social History   Tobacco Use  Smoking Status Never   Passive exposure: Past  Smokeless Tobacco Never    Social History   Substance and Sexual Activity  Alcohol Use Never     Allergies  Allergen Reactions   Cephalosporins Itching   Codeine Other (See Comments)    Extreme drowsiness   Contrast Media [Iodinated Contrast Media] Hives and Swelling    Facial swelling   Oxycodone Other (See Comments)    Extreme drowsiness    Current Outpatient Medications  Medication Sig Dispense Refill   acetaminophen (TYLENOL) 325 MG tablet Take 650 mg by mouth every 6 (six) hours as needed for moderate pain (pain score 4-6).     ADVAIR HFA 115-21 MCG/ACT inhaler Inhale 2 puffs into the lungs 2 (two) times daily.     albuterol (VENTOLIN HFA) 108 (90 Base)  MCG/ACT inhaler Inhale 2-4 puffs by mouth every 4 hours as needed for wheezing, cough, and/or shortness of breath (Patient taking differently: Inhale 2 puffs into the lungs every 4 (four) hours as needed for wheezing or shortness of breath.) 6.7 g 0   ALPRAZolam (XANAX) 0.25 MG tablet Take 1 tablet (0.25 mg total) by mouth 2 (two) times daily as needed for anxiety. (Patient taking differently: Take 0.25 mg by mouth as needed for anxiety.) 20 tablet 0   amiodarone (PACERONE) 100 MG tablet Take 1 tablet (100 mg total) by mouth daily. 30 tablet 0   apixaban (ELIQUIS) 2.5 MG TABS tablet Take 1 tablet (2.5 mg total) by mouth 2 (two) times daily. 60 tablet 1   Ascorbic Acid (VITAMIN C CR) 1500 MG TBCR Take 1 tablet by mouth daily.     bismuth subsalicylate (PEPTO BISMOL) 262 MG/15ML suspension Take 30 mLs by mouth  every 6 (six) hours as needed for indigestion or diarrhea or loose stools.     Carboxymeth-Glycerin-Polysorb (REFRESH OPTIVE ADVANCED) 0.5-1-0.5 % SOLN Apply 2 drops to eye 4 (four) times daily as needed (allergies).     escitalopram (LEXAPRO) 10 MG tablet Take 1 tablet (10 mg total) by mouth daily.     esomeprazole (NEXIUM) 40 MG capsule Take 40 mg by mouth 2 (two) times daily.     feeding supplement (ENSURE ENLIVE / ENSURE PLUS) LIQD Take 237 mLs by mouth 3 (three) times daily between meals. (Patient taking differently: Take 237 mLs by mouth daily.)     ferrous sulfate 325 (65 FE) MG tablet Take 325 mg by mouth 3 (three) times a week. Monday, Wednesday, Friday     fluticasone (FLONASE) 50 MCG/ACT nasal spray Place 2 sprays into both nostrils daily as needed for allergies or rhinitis.     furosemide (LASIX) 20 MG tablet Take 2 tablets (40 mg total) by mouth daily. (Patient not taking: Reported on 05/26/2023)     guaiFENesin (MUCINEX) 600 MG 12 hr tablet Take 600 mg by mouth daily.     ipratropium-albuterol (DUONEB) 0.5-2.5 (3) MG/3ML SOLN Take 3 mLs by nebulization every 4 (four) hours as needed (wheezing/SOB).     levothyroxine (SYNTHROID) 125 MCG tablet Take 125 mcg by mouth daily before breakfast.     Magnesium 250 MG TABS Take 1 tablet by mouth daily.     Multiple Vitamin (MULTIVITAMIN WITH MINERALS) TABS tablet Take 1 tablet by mouth daily.     nitroGLYCERIN (NITROSTAT) 0.4 MG SL tablet Place 0.4 mg under the tongue every 5 (five) minutes as needed for chest pain.     OXYGEN Inhale 2 L into the lungs at bedtime.     rOPINIRole (REQUIP) 0.25 MG tablet Take 0.25 mg by mouth at bedtime.     sodium chloride (OCEAN) 0.65 % SOLN nasal spray Place 1 spray into both nostrils as needed for congestion.  0   traZODone (DESYREL) 50 MG tablet Take 50 mg by mouth at bedtime. (Patient not taking: Reported on 05/26/2023)     TURMERIC PO Take 1,900 mg by mouth daily.     VITAMIN D-VITAMIN K PO Take 1 capsule  by mouth at bedtime.     acetaminophen (TYLENOL) 650 MG CR tablet Take 1,300 mg by mouth in the morning and at bedtime.     busPIRone (BUSPAR) 5 MG tablet Take 5 mg by mouth 3 (three) times daily as needed (anxiety).     dextromethorphan-guaiFENesin (ROBITUSSIN-DM) 10-100 MG/5ML liquid Take 10 mLs by mouth  3 (three) times daily as needed for cough.     furosemide (LASIX) 40 MG tablet Take 40 mg by mouth daily.     Omega-3 Fatty Acids (FISH OIL PO) Take 1 capsule by mouth daily.     OVER THE COUNTER MEDICATION Take 465 mg by mouth daily. Black seed cumin echinacea     PHILLIPS MILK OF MAGNESIA 400 MG/5ML suspension Take 30 mLs by mouth daily as needed for indigestion.     polyethylene glycol (MIRALAX / GLYCOLAX) 17 g packet Take 17 g by mouth daily.     potassium chloride (KLOR-CON) 10 MEQ tablet Take 10 mEq by mouth daily.     potassium chloride (KLOR-CON) 8 MEQ tablet Take 8 mEq by mouth at bedtime.     senna (SENOKOT) 8.6 MG TABS tablet Take 2 tablets by mouth in the morning and at bedtime.     traZODone (DESYREL) 100 MG tablet Take 100 mg by mouth at bedtime.     No current facility-administered medications for this visit.    Review of Systems  Constitutional:  Positive for malaise/fatigue.  Respiratory:  Positive for cough and shortness of breath.   Cardiovascular:  Negative for chest pain.  Neurological: Negative.     PHYSICAL EXAMINATION: BP (!) 170/72 (BP Location: Right Arm, Patient Position: Sitting, Cuff Size: Normal)   Pulse 77   Resp 20   Ht 5\' 2"  (1.575 m)   Wt 142 lb (64.4 kg)   SpO2 93% Comment: RA  BMI 25.97 kg/m   Physical Exam Constitutional:      General: She is not in acute distress.    Appearance: She is not ill-appearing.  HENT:     Head: Normocephalic and atraumatic.  Eyes:     Extraocular Movements: Extraocular movements intact.  Cardiovascular:     Rate and Rhythm: Normal rate.  Pulmonary:     Effort: Pulmonary effort is normal. No respiratory  distress.  Musculoskeletal:        General: Normal range of motion.     Cervical back: Normal range of motion.  Skin:    General: Skin is warm and dry.  Neurological:     General: No focal deficit present.     Mental Status: She is alert and oriented to person, place, and time.      Diagnostic Studies & Laboratory data:     Recent Radiology Findings:   DG Chest Port 1 View Result Date: 05/03/2023 CLINICAL DATA:  Post thoracentesis EXAM: PORTABLE CHEST 1 VIEW COMPARISON:  X-ray 04/29/2023 and older FINDINGS: Left shoulder reverse arthroplasty seen. Loop recorder. Normal cardiopericardial silhouette with calcified aorta. Prominent calcifications along the mitral valve annulus. Decreasing tiny right pleural effusion. No pneumothorax or consolidation. No edema. Surgical clips in the right upper quadrant. Degenerative changes of the spine. IMPRESSION: Decreasing right pleural effusion. No pneumothorax post thoracentesis. Electronically Signed   By: Karen Kays M.D.   On: 05/03/2023 15:02   US THORACENTESIS ASP PLEURAL SPACE W/IMG GUIDE Result Date: 05/03/2023 INDICATION: 77 year old female with history of COPD, lymphoma with recurrent symptomatic right pleural effusion previous thoracentesis have been helpful with improvement in breathing. Request received for therapeutic right thoracentesis. EXAM: ULTRASOUND GUIDED RIGHT THORACENTESIS MEDICATIONS: 10 mL 1% lidocaine COMPLICATIONS: None immediate. PROCEDURE: An ultrasound guided thoracentesis was thoroughly discussed with the patient and questions answered. The benefits, risks, alternatives and complications were also discussed. The patient understands and wishes to proceed with the procedure. Written consent was obtained. Ultrasound was  performed to localize and mark an adequate pocket of fluid in the right chest. The area was then prepped and draped in the normal sterile fashion. 1% Lidocaine was used for local anesthesia. Under ultrasound  guidance a 6 Fr Safe-T-Centesis catheter was introduced. Thoracentesis was performed. The catheter was removed and a dressing applied. FINDINGS: A total of approximately 500 mL of amber fluid was removed. IMPRESSION: Successful ultrasound guided right thoracentesis yielding 500 mL of pleural fluid. Performed by: Loyce Dys PA-C Electronically Signed   By: Judie Petit.  Shick M.D.   On: 05/03/2023 13:47   DG Chest Portable 1 View Result Date: 04/29/2023 CLINICAL DATA:  Shortness of breath. EXAM: PORTABLE CHEST 1 VIEW COMPARISON:  04/27/2023. FINDINGS: There is small right pleural effusion, decreased since the prior study. Bilateral lung fields are clear. No acute consolidation or lung collapse. Left lateral costophrenic angle is clear. No pneumothorax on either side. Stable cardio-mediastinal silhouette. Presumed loop recorder device noted overlying the left lower chest wall. No acute osseous abnormalities. Old healed fractures of posterior left fourth through seventh ribs noted. Left shoulder reverse arthroplasty noted. The soft tissues are within normal limits. IMPRESSION: *Small right pleural effusion, decreased since the prior study. No pneumothorax. Electronically Signed   By: Jules Schick M.D.   On: 04/29/2023 14:12   DG Chest Port 1 View Result Date: 04/27/2023 CLINICAL DATA:  Right pleural effusion, status post thoracentesis EXAM: PORTABLE CHEST 1 VIEW COMPARISON:  04/27/2023 12:46 p.m. FINDINGS: Substantially reduced size of the right pleural effusion. No pneumothorax. There is some residual indistinct airspace opacity at the right lung base probably from atelectasis, along with some residual blunting of the right lateral costophrenic angle. Loop recorder and left shoulder reverse arthroplasty again noted. Bony demineralization. Old left rib fractures. IMPRESSION: 1. Substantially reduced size of the right pleural effusion, without pneumothorax. 2. Residual indistinct airspace opacity at the right lung  base probably from atelectasis. Blunting the right lateral costophrenic angle indicating some degree of residual pleural effusion. 3. Bony demineralization. Electronically Signed   By: Gaylyn Rong M.D.   On: 04/27/2023 17:07   DG Chest 2 View Result Date: 04/27/2023 CLINICAL DATA:  Pleural effusions EXAM: CHEST - 2 VIEW COMPARISON:  04/22/2023 FINDINGS: Normal cardiac silhouette. Moderate RIGHT effusion improved slightly from comparison exam. No pneumothorax. LEFT lung clear. LEFT shoulder arthroplasty IMPRESSION: Slight improvement in moderate RIGHT effusion. Electronically Signed   By: Genevive Bi M.D.   On: 04/27/2023 16:34   US THORACENTESIS ASP PLEURAL SPACE W/IMG GUIDE Result Date: 04/27/2023 INDICATION: Patient with history of COPD, lymphoma with recurrent symptomatic right pleural effusion previous thoracentesis have been helpful with improvement in breathing. Request received for therapeutic right thoracentesis. EXAM: ULTRASOUND GUIDED RIGHT THORACENTESIS MEDICATIONS: Local 1% lidocaine only. COMPLICATIONS: None immediate. PROCEDURE: An ultrasound guided thoracentesis was thoroughly discussed with the patient and questions answered. The benefits, risks, alternatives and complications were also discussed. The patient understands and wishes to proceed with the procedure. Written consent was obtained. Ultrasound was performed to localize and mark an adequate pocket of fluid in the right chest. The area was then prepped and draped in the normal sterile fashion. 1% Lidocaine was used for local anesthesia. Under ultrasound guidance a 19 gauge, 7-cm, Yueh catheter was introduced. Thoracentesis was performed. The catheter was removed and a dressing applied. FINDINGS: A total of approximately 1.1 L of amber/bloody fluid was removed. Samples were sent to the laboratory as requested by the clinical team. IMPRESSION: Successful ultrasound guided right thoracentesis  yielding 1.1 L of pleural fluid.  This exam was performed by Pattricia Boss PA-C, and was supervised and interpreted by Dr. Fredia Sorrow. Electronically Signed   By: Irish Lack M.D.   On: 04/27/2023 15:43       I have independently reviewed the above radiology studies  and reviewed the findings with the patient.   Recent Lab Findings: Lab Results  Component Value Date   WBC 8.1 05/26/2023   HGB 11.6 (L) 05/26/2023   HCT 38.0 05/26/2023   PLT 189 05/26/2023   GLUCOSE 95 05/26/2023   CHOL 186 05/23/2022   TRIG 154 (H) 05/23/2022   HDL 32 (L) 05/23/2022   LDLCALC 123 (H) 05/23/2022   ALT 13 05/26/2023   AST 22 05/26/2023   NA 137 05/26/2023   K 3.9 05/26/2023   CL 106 05/26/2023   CREATININE 0.62 05/26/2023   BUN 13 05/26/2023   CO2 21 (L) 05/26/2023   TSH 3.119 05/22/2022   INR 1.1 05/26/2023   HGBA1C 5.4 05/26/2023     IMPRESSION: 1. Moderate-large layering right-sided pleural effusion with complete atelectasis of the right lower lobe and near-complete atelectasis of the right middle lobe. Partial compressive atelectasis of the right upper lobe. 2. Mild mosaic attenuation of the aerated portion of the right upper lobe and within the left lung, which may be related to small airways disease. 3. Nodular hepatic surface contour, suggestive of cirrhosis. 4. Aortic and coronary artery atherosclerosis (ICD10-I70.0).  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  We discussed the risks and benefits of a right video-assisted thoracoscopy with pleural biopsy.  The patient would also wish to proceed with a Pleurx catheter placement at the same time.      Corliss Skains 05/26/2023 4:20 PM

## 2023-05-24 NOTE — Telephone Encounter (Signed)
Referral faxed to [facility] 

## 2023-05-24 NOTE — Progress Notes (Signed)
[address]       [place] [address]             [phone]                    [name] [facility] Medical Record #[id number] Date of Birth: 1945/12/07  Referring: Darlin Priestly, MD Primary Care: [facility] Primary Cardiologist: None  Chief Complaint:    Chief Complaint  Patient presents with   Pleural Effusion    CT CAP 11/8, PET 11/13, Thoracentesis x2     History of Present Illness:    [name] 77 y.o. female presents for surgical evaluation of her recurrent right-sided pleural effusion.  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  She currently has minimal respiratory symptoms.    Past Medical History:  Diagnosis Date   A-fib (HCC)    Anemia    Anxiety    Arthritis    Asthma    Cirrhosis (HCC)    COPD (chronic obstructive pulmonary disease) (HCC)    Diabetes mellitus without complication (HCC)    type 2   DVT (deep venous thrombosis) (HCC) 1980   GERD (gastroesophageal reflux disease)    History of hiatal hernia    Hypertension    Hypothyroidism    Lymphoma (HCC)     Past Surgical History:  Procedure Laterality Date   CATARACT EXTRACTION Bilateral    CHOLECYSTECTOMY     COLONOSCOPY     ESOPHAGOGASTRODUODENOSCOPY (EGD) WITH PROPOFOL N/A 01/17/2022   Procedure: ESOPHAGOGASTRODUODENOSCOPY (EGD) WITH PROPOFOL;  Surgeon: Jaynie Collins, DO;  Location: [facility] ENDOSCOPY;  Service: Gastroenterology;  Laterality: N/A;   HUMERUS SURGERY Left    fracture (7 surgeries total)   IR BONE MARROW BIOPSY & ASPIRATION  04/26/2023   LOOP RECORDER INSERTION  Jun 16, 2017   pt states battery died, not transmitting, just there   MENISCUS REPAIR Right 06/16/2018   THYROIDECTOMY     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; reconstruction from hip bone   TONSILECTOMY, ADENOIDECTOMY, BILATERAL MYRINGOTOMY AND TUBES     TOTAL KNEE ARTHROPLASTY Right 03/14/2022   Procedure: TOTAL KNEE ARTHROPLASTY;  Surgeon:  Reinaldo Berber, MD;  Location: [facility] ORS;  Service: Orthopedics;  Laterality: Right;   TOTAL SHOULDER REPLACEMENT Left    TUBAL LIGATION     WRIST SURGERY Left    multiple fractures from falls (14 surgeries from wrist to shoulder)    Family History  Problem Relation Age of Onset   Kidney disease Mother    Tuberculosis Father      Social History   Tobacco Use  Smoking Status Never   Passive exposure: Past  Smokeless Tobacco Never    Social History   Substance and Sexual Activity  Alcohol Use Never     Allergies  Allergen Reactions   Cephalosporins Itching   Codeine Other (See Comments)    Extreme drowsiness   Contrast Media [Iodinated Contrast Media] Hives and Swelling    Facial swelling   Oxycodone Other (See Comments)    Extreme drowsiness    Current Outpatient Medications  Medication Sig Dispense Refill   acetaminophen (TYLENOL) 325 MG tablet Take 650 mg by mouth every 6 (six) hours as needed for moderate pain (pain score 4-6).     ADVAIR HFA 115-21 MCG/ACT inhaler Inhale 2 puffs into the lungs 2 (two) times daily.     albuterol (VENTOLIN HFA) 108 (90 Base)  MCG/ACT inhaler Inhale 2-4 puffs by mouth every 4 hours as needed for wheezing, cough, and/or shortness of breath (Patient taking differently: Inhale 2 puffs into the lungs every 4 (four) hours as needed for wheezing or shortness of breath.) 6.7 g 0   ALPRAZolam (XANAX) 0.25 MG tablet Take 1 tablet (0.25 mg total) by mouth 2 (two) times daily as needed for anxiety. (Patient taking differently: Take 0.25 mg by mouth as needed for anxiety.) 20 tablet 0   amiodarone (PACERONE) 100 MG tablet Take 1 tablet (100 mg total) by mouth daily. 30 tablet 0   apixaban (ELIQUIS) 2.5 MG TABS tablet Take 1 tablet (2.5 mg total) by mouth 2 (two) times daily. 60 tablet 1   Ascorbic Acid (VITAMIN C CR) 1500 MG TBCR Take 1 tablet by mouth daily.     bismuth subsalicylate (PEPTO BISMOL) 262 MG/15ML suspension Take 30 mLs by mouth  every 6 (six) hours as needed for indigestion or diarrhea or loose stools.     Carboxymeth-Glycerin-Polysorb (REFRESH OPTIVE ADVANCED) 0.5-1-0.5 % SOLN Apply 2 drops to eye 4 (four) times daily as needed (allergies).     escitalopram (LEXAPRO) 10 MG tablet Take 1 tablet (10 mg total) by mouth daily.     esomeprazole (NEXIUM) 40 MG capsule Take 40 mg by mouth 2 (two) times daily.     feeding supplement (ENSURE ENLIVE / ENSURE PLUS) LIQD Take 237 mLs by mouth 3 (three) times daily between meals. (Patient taking differently: Take 237 mLs by mouth daily.)     ferrous sulfate 325 (65 FE) MG tablet Take 325 mg by mouth 3 (three) times a week. Monday, Wednesday, Friday     fluticasone (FLONASE) 50 MCG/ACT nasal spray Place 2 sprays into both nostrils daily as needed for allergies or rhinitis.     furosemide (LASIX) 20 MG tablet Take 2 tablets (40 mg total) by mouth daily. (Patient not taking: Reported on 05/26/2023)     guaiFENesin (MUCINEX) 600 MG 12 hr tablet Take 600 mg by mouth daily.     ipratropium-albuterol (DUONEB) 0.5-2.5 (3) MG/3ML SOLN Take 3 mLs by nebulization every 4 (four) hours as needed (wheezing/SOB).     levothyroxine (SYNTHROID) 125 MCG tablet Take 125 mcg by mouth daily before breakfast.     Magnesium 250 MG TABS Take 1 tablet by mouth daily.     Multiple Vitamin (MULTIVITAMIN WITH MINERALS) TABS tablet Take 1 tablet by mouth daily.     nitroGLYCERIN (NITROSTAT) 0.4 MG SL tablet Place 0.4 mg under the tongue every 5 (five) minutes as needed for chest pain.     OXYGEN Inhale 2 L into the lungs at bedtime.     rOPINIRole (REQUIP) 0.25 MG tablet Take 0.25 mg by mouth at bedtime.     sodium chloride (OCEAN) 0.65 % SOLN nasal spray Place 1 spray into both nostrils as needed for congestion.  0   traZODone (DESYREL) 50 MG tablet Take 50 mg by mouth at bedtime. (Patient not taking: Reported on 05/26/2023)     TURMERIC PO Take 1,900 mg by mouth daily.     VITAMIN D-VITAMIN K PO Take 1 capsule  by mouth at bedtime.     acetaminophen (TYLENOL) 650 MG CR tablet Take 1,300 mg by mouth in the morning and at bedtime.     busPIRone (BUSPAR) 5 MG tablet Take 5 mg by mouth 3 (three) times daily as needed (anxiety).     dextromethorphan-guaiFENesin (ROBITUSSIN-DM) 10-100 MG/5ML liquid Take 10 mLs by mouth  3 (three) times daily as needed for cough.     furosemide (LASIX) 40 MG tablet Take 40 mg by mouth daily.     Omega-3 Fatty Acids (FISH OIL PO) Take 1 capsule by mouth daily.     OVER THE COUNTER MEDICATION Take 465 mg by mouth daily. Black seed cumin echinacea     PHILLIPS MILK OF MAGNESIA 400 MG/5ML suspension Take 30 mLs by mouth daily as needed for indigestion.     polyethylene glycol (MIRALAX / GLYCOLAX) 17 g packet Take 17 g by mouth daily.     potassium chloride (KLOR-CON) 10 MEQ tablet Take 10 mEq by mouth daily.     potassium chloride (KLOR-CON) 8 MEQ tablet Take 8 mEq by mouth at bedtime.     senna (SENOKOT) 8.6 MG TABS tablet Take 2 tablets by mouth in the morning and at bedtime.     traZODone (DESYREL) 100 MG tablet Take 100 mg by mouth at bedtime.     No current facility-administered medications for this visit.    Review of Systems  Constitutional:  Positive for malaise/fatigue.  Respiratory:  Positive for cough and shortness of breath.   Cardiovascular:  Negative for chest pain.  Neurological: Negative.     PHYSICAL EXAMINATION: BP (!) 170/72 (BP Location: Right Arm, Patient Position: Sitting, Cuff Size: Normal)   Pulse 77   Resp 20   Ht 5\' 2"  (1.575 m)   Wt 142 lb (64.4 kg)   SpO2 93% Comment: RA  BMI 25.97 kg/m   Physical Exam Constitutional:      General: She is not in acute distress.    Appearance: She is not ill-appearing.  HENT:     Head: Normocephalic and atraumatic.  Eyes:     Extraocular Movements: Extraocular movements intact.  Cardiovascular:     Rate and Rhythm: Normal rate.  Pulmonary:     Effort: Pulmonary effort is normal. No respiratory  distress.  Musculoskeletal:        General: Normal range of motion.     Cervical back: Normal range of motion.  Skin:    General: Skin is warm and dry.  Neurological:     General: No focal deficit present.     Mental Status: She is alert and oriented to person, place, and time.      Diagnostic Studies & Laboratory data:     Recent Radiology Findings:   DG Chest Port 1 View Result Date: 05/03/2023 CLINICAL DATA:  Post thoracentesis EXAM: PORTABLE CHEST 1 VIEW COMPARISON:  X-ray 04/29/2023 and older FINDINGS: Left shoulder reverse arthroplasty seen. Loop recorder. Normal cardiopericardial silhouette with calcified aorta. Prominent calcifications along the mitral valve annulus. Decreasing tiny right pleural effusion. No pneumothorax or consolidation. No edema. Surgical clips in the right upper quadrant. Degenerative changes of the spine. IMPRESSION: Decreasing right pleural effusion. No pneumothorax post thoracentesis. Electronically Signed   By: Karen Kays M.D.   On: 05/03/2023 15:02   US THORACENTESIS ASP PLEURAL SPACE W/IMG GUIDE Result Date: 05/03/2023 INDICATION: 77 year old female with history of COPD, lymphoma with recurrent symptomatic right pleural effusion previous thoracentesis have been helpful with improvement in breathing. Request received for therapeutic right thoracentesis. EXAM: ULTRASOUND GUIDED RIGHT THORACENTESIS MEDICATIONS: 10 mL 1% lidocaine COMPLICATIONS: None immediate. PROCEDURE: An ultrasound guided thoracentesis was thoroughly discussed with the patient and questions answered. The benefits, risks, alternatives and complications were also discussed. The patient understands and wishes to proceed with the procedure. Written consent was obtained. Ultrasound was  performed to localize and mark an adequate pocket of fluid in the right chest. The area was then prepped and draped in the normal sterile fashion. 1% Lidocaine was used for local anesthesia. Under ultrasound  guidance a 6 Fr Safe-T-Centesis catheter was introduced. Thoracentesis was performed. The catheter was removed and a dressing applied. FINDINGS: A total of approximately 500 mL of amber fluid was removed. IMPRESSION: Successful ultrasound guided right thoracentesis yielding 500 mL of pleural fluid. Performed by: Loyce Dys PA-C Electronically Signed   By: Judie Petit.  Shick M.D.   On: 05/03/2023 13:47   DG Chest Portable 1 View Result Date: 04/29/2023 CLINICAL DATA:  Shortness of breath. EXAM: PORTABLE CHEST 1 VIEW COMPARISON:  04/27/2023. FINDINGS: There is small right pleural effusion, decreased since the prior study. Bilateral lung fields are clear. No acute consolidation or lung collapse. Left lateral costophrenic angle is clear. No pneumothorax on either side. Stable cardio-mediastinal silhouette. Presumed loop recorder device noted overlying the left lower chest wall. No acute osseous abnormalities. Old healed fractures of posterior left fourth through seventh ribs noted. Left shoulder reverse arthroplasty noted. The soft tissues are within normal limits. IMPRESSION: *Small right pleural effusion, decreased since the prior study. No pneumothorax. Electronically Signed   By: Jules Schick M.D.   On: 04/29/2023 14:12   DG Chest Port 1 View Result Date: 04/27/2023 CLINICAL DATA:  Right pleural effusion, status post thoracentesis EXAM: PORTABLE CHEST 1 VIEW COMPARISON:  04/27/2023 12:46 p.m. FINDINGS: Substantially reduced size of the right pleural effusion. No pneumothorax. There is some residual indistinct airspace opacity at the right lung base probably from atelectasis, along with some residual blunting of the right lateral costophrenic angle. Loop recorder and left shoulder reverse arthroplasty again noted. Bony demineralization. Old left rib fractures. IMPRESSION: 1. Substantially reduced size of the right pleural effusion, without pneumothorax. 2. Residual indistinct airspace opacity at the right lung  base probably from atelectasis. Blunting the right lateral costophrenic angle indicating some degree of residual pleural effusion. 3. Bony demineralization. Electronically Signed   By: Gaylyn Rong M.D.   On: 04/27/2023 17:07   DG Chest 2 View Result Date: 04/27/2023 CLINICAL DATA:  Pleural effusions EXAM: CHEST - 2 VIEW COMPARISON:  04/22/2023 FINDINGS: Normal cardiac silhouette. Moderate RIGHT effusion improved slightly from comparison exam. No pneumothorax. LEFT lung clear. LEFT shoulder arthroplasty IMPRESSION: Slight improvement in moderate RIGHT effusion. Electronically Signed   By: Genevive Bi M.D.   On: 04/27/2023 16:34   US THORACENTESIS ASP PLEURAL SPACE W/IMG GUIDE Result Date: 04/27/2023 INDICATION: Patient with history of COPD, lymphoma with recurrent symptomatic right pleural effusion previous thoracentesis have been helpful with improvement in breathing. Request received for therapeutic right thoracentesis. EXAM: ULTRASOUND GUIDED RIGHT THORACENTESIS MEDICATIONS: Local 1% lidocaine only. COMPLICATIONS: None immediate. PROCEDURE: An ultrasound guided thoracentesis was thoroughly discussed with the patient and questions answered. The benefits, risks, alternatives and complications were also discussed. The patient understands and wishes to proceed with the procedure. Written consent was obtained. Ultrasound was performed to localize and mark an adequate pocket of fluid in the right chest. The area was then prepped and draped in the normal sterile fashion. 1% Lidocaine was used for local anesthesia. Under ultrasound guidance a 19 gauge, 7-cm, Yueh catheter was introduced. Thoracentesis was performed. The catheter was removed and a dressing applied. FINDINGS: A total of approximately 1.1 L of amber/bloody fluid was removed. Samples were sent to the laboratory as requested by the clinical team. IMPRESSION: Successful ultrasound guided right thoracentesis  yielding 1.1 L of pleural fluid.  This exam was performed by Pattricia Boss PA-C, and was supervised and interpreted by Dr. Fredia Sorrow. Electronically Signed   By: Irish Lack M.D.   On: 04/27/2023 15:43       I have independently reviewed the above radiology studies  and reviewed the findings with the patient.   Recent Lab Findings: Lab Results  Component Value Date   WBC 8.1 05/26/2023   HGB 11.6 (L) 05/26/2023   HCT 38.0 05/26/2023   PLT 189 05/26/2023   GLUCOSE 95 05/26/2023   CHOL 186 05/23/2022   TRIG 154 (H) 05/23/2022   HDL 32 (L) 05/23/2022   LDLCALC 123 (H) 05/23/2022   ALT 13 05/26/2023   AST 22 05/26/2023   NA 137 05/26/2023   K 3.9 05/26/2023   CL 106 05/26/2023   CREATININE 0.62 05/26/2023   BUN 13 05/26/2023   CO2 21 (L) 05/26/2023   TSH 3.119 05/22/2022   INR 1.1 05/26/2023   HGBA1C 5.4 05/26/2023     IMPRESSION: 1. Moderate-large layering right-sided pleural effusion with complete atelectasis of the right lower lobe and near-complete atelectasis of the right middle lobe. Partial compressive atelectasis of the right upper lobe. 2. Mild mosaic attenuation of the aerated portion of the right upper lobe and within the left lung, which may be related to small airways disease. 3. Nodular hepatic surface contour, suggestive of cirrhosis. 4. Aortic and coronary artery atherosclerosis (ICD10-I70.0).  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  We discussed the risks and benefits of a right video-assisted thoracoscopy with pleural biopsy.  The patient would also wish to proceed with a Pleurx catheter placement at the same time.      Corliss Skains 05/26/2023 4:20 PM

## 2023-05-25 ENCOUNTER — Inpatient Hospital Stay: Payer: Medicare (Managed Care) | Admitting: Pulmonary Disease

## 2023-05-25 MED ORDER — polyethylene glycol (GOLYTELY) 236-22.74-6.74 -5.86 gram solution
236-22.74-6.74 | Freq: Once | ORAL | 0 refills | 14.00000 days | Status: AC
Start: 2023-05-25 — End: 2023-05-25

## 2023-05-26 ENCOUNTER — Other Ambulatory Visit: Payer: Self-pay | Admitting: *Deleted

## 2023-05-26 ENCOUNTER — Encounter (HOSPITAL_COMMUNITY): Payer: Self-pay

## 2023-05-26 ENCOUNTER — Encounter (HOSPITAL_COMMUNITY)
Admission: RE | Admit: 2023-05-26 | Discharge: 2023-05-26 | Disposition: A | Discharge status: Home / Self Care | Payer: Medicare (Managed Care) | Source: Ambulatory Visit | Attending: Thoracic Surgery (Cardiothoracic Vascular Surgery) | Admitting: Thoracic Surgery (Cardiothoracic Vascular Surgery)

## 2023-05-26 ENCOUNTER — Ambulatory Visit (HOSPITAL_COMMUNITY)
Admission: RE | Admit: 2023-05-26 | Discharge: 2023-05-26 | Disposition: A | Discharge status: Home / Self Care | Payer: Medicare (Managed Care) | Source: Ambulatory Visit | Attending: Thoracic Surgery (Cardiothoracic Vascular Surgery) | Admitting: Thoracic Surgery (Cardiothoracic Vascular Surgery)

## 2023-05-26 ENCOUNTER — Encounter: Payer: Self-pay | Admitting: Thoracic Surgery (Cardiothoracic Vascular Surgery)

## 2023-05-26 ENCOUNTER — Institutional Professional Consult (permissible substitution) (INDEPENDENT_AMBULATORY_CARE_PROVIDER_SITE_OTHER): Payer: Medicare (Managed Care) | Admitting: Thoracic Surgery (Cardiothoracic Vascular Surgery)

## 2023-05-26 ENCOUNTER — Other Ambulatory Visit: Payer: Self-pay

## 2023-05-26 VITALS — BP 170/72 | HR 77 | Resp 20 | Ht 62.0 in | Wt 142.0 lb

## 2023-05-26 VITALS — BP 159/69 | HR 73 | Temp 98.3°F | Resp 18 | Ht 60.0 in | Wt 145.0 lb

## 2023-05-26 DIAGNOSIS — C8590 Non-Hodgkin lymphoma, unspecified, unspecified site: Secondary | ICD-10-CM | POA: Insufficient documentation

## 2023-05-26 DIAGNOSIS — Z01818 Encounter for other preprocedural examination: Secondary | ICD-10-CM | POA: Insufficient documentation

## 2023-05-26 DIAGNOSIS — Z7901 Long term (current) use of anticoagulants: Secondary | ICD-10-CM | POA: Insufficient documentation

## 2023-05-26 DIAGNOSIS — J90 Pleural effusion, not elsewhere classified: Secondary | ICD-10-CM | POA: Insufficient documentation

## 2023-05-26 DIAGNOSIS — E119 Type 2 diabetes mellitus without complications: Secondary | ICD-10-CM | POA: Insufficient documentation

## 2023-05-26 DIAGNOSIS — Z79899 Other long term (current) drug therapy: Secondary | ICD-10-CM | POA: Insufficient documentation

## 2023-05-26 HISTORY — DX: Anxiety disorder, unspecified: F41.9

## 2023-05-26 HISTORY — DX: Non-Hodgkin lymphoma, unspecified, unspecified site: C85.90

## 2023-05-26 HISTORY — DX: Unspecified osteoarthritis, unspecified site: M19.90

## 2023-05-26 HISTORY — DX: Unspecified cirrhosis of liver: K74.60

## 2023-05-26 HISTORY — DX: Unspecified atrial fibrillation: I48.91

## 2023-05-26 LAB — COMPREHENSIVE METABOLIC PANEL
ALT: 13 U/L (ref 0–44)
AST: 22 U/L (ref 15–41)
Albumin: 3.6 g/dL (ref 3.5–5.0)
Alkaline Phosphatase: 46 U/L (ref 38–126)
Anion gap: 10 (ref 5–15)
BUN: 13 mg/dL (ref 8–23)
CO2: 21 mmol/L — ABNORMAL LOW (ref 22–32)
Calcium: 8.8 mg/dL — ABNORMAL LOW (ref 8.9–10.3)
Chloride: 106 mmol/L (ref 98–111)
Creatinine, Ser: 0.62 mg/dL (ref 0.44–1.00)
GFR, Estimated: >60 mL/min (ref >60)
Glucose, Bld: 95 mg/dL (ref 70–99)
Potassium: 3.9 mmol/L (ref 3.5–5.1)
Sodium: 137 mmol/L (ref 135–145)
Total Bilirubin: 0.9 mg/dL (ref <1.2)
Total Protein: 7.3 g/dL (ref 6.5–8.1)

## 2023-05-26 LAB — TYPE AND SCREEN
ABO/RH(D): A POS
Antibody Screen: NEGATIVE

## 2023-05-26 LAB — URINALYSIS, ROUTINE W REFLEX MICROSCOPIC
Bilirubin Urine: NEGATIVE
Glucose, UA: NEGATIVE mg/dL
Ketones, ur: NEGATIVE mg/dL
Leukocytes,Ua: NEGATIVE
Nitrite: NEGATIVE
Protein, ur: NEGATIVE mg/dL
Specific Gravity, Urine: 1.019 (ref 1.005–1.030)
pH: 5 (ref 5.0–8.0)

## 2023-05-26 LAB — PROTIME-INR
INR: 1.1 (ref 0.8–1.2)
Prothrombin Time: 14.5 s (ref 11.4–15.2)

## 2023-05-26 LAB — CBC
HCT: 38 % (ref 36.0–46.0)
Hemoglobin: 11.6 g/dL — ABNORMAL LOW (ref 12.0–15.0)
MCH: 29.8 pg (ref 26.0–34.0)
MCHC: 30.5 g/dL (ref 30.0–36.0)
MCV: 97.7 fL (ref 80.0–100.0)
Platelets: 189 10*3/uL (ref 150–400)
RBC: 3.89 MIL/uL (ref 3.87–5.11)
RDW: 15.3 % (ref 11.5–15.5)
WBC: 8.1 10*3/uL (ref 4.0–10.5)
nRBC: 0 % (ref 0.0–0.2)

## 2023-05-26 LAB — HEMOGLOBIN A1C
Hgb A1c MFr Bld: 5.4 % (ref 4.8–5.6)
Mean Plasma Glucose: 108.28 mg/dL

## 2023-05-26 LAB — ACID FAST CULTURE WITH REFLEXED SENSITIVITIES (MYCOBACTERIA): Acid Fast Culture: NEGATIVE

## 2023-05-26 LAB — GLUCOSE, CAPILLARY: Glucose-Capillary: 108 mg/dL — ABNORMAL HIGH (ref 70–99)

## 2023-05-26 LAB — SURGICAL PCR SCREEN
MRSA, PCR: NEGATIVE
Staphylococcus aureus: NEGATIVE

## 2023-05-26 LAB — SARS CORONAVIRUS 2 BY RT PCR: SARS Coronavirus 2 by RT PCR: NEGATIVE

## 2023-05-26 LAB — APTT: aPTT: 26 s (ref 24–36)

## 2023-05-26 NOTE — Progress Notes (Signed)
PCP - Rhett Bannister, NP- PACE Cardiologist - denies Pulmonologist- Dr. Janann Colonel Oncologist- Dr. Rickard Patience  PPM/ICD - denies   Chest x-ray - 05/26/23 EKG - 04/29/23 Stress Test - 05/25/22 ECHO - 04/09/23 Cardiac Cath - denies  Sleep Study - denies   Fasting Blood Sugar - 100-115 Checks Blood Sugar once a day  Last dose of GLP1 agonist-  n/a   Blood Thinner Instructions: Hold Xarelto starting today. Last dose 12/20 AM dose Aspirin Instructions: n/a  ERAS Protcol - no, NPO   COVID TEST- 05/26/23   Anesthesia review: yes, lymphoma, O2 2L at bedtime  Patient denies shortness of breath, fever, cough and chest pain at PAT appointment   All instructions explained to the patient, with a verbal understanding of the material. Patient agrees to go over the instructions while at home for a better understanding. Patient also instructed to wear a mask in public after being tested for COVID-19. The opportunity to ask questions was provided.

## 2023-05-26 NOTE — Pre-Procedure Instructions (Signed)
Surgical Instructions   Your procedure is scheduled on Monday 12/23. Report to [facility] Main Entrance "A" at 10:00 A.M., then check in with the Admitting office. Any questions or running late day of surgery: call [phone]  Questions prior to your surgery date: call [phone], Monday-Friday, 8am-4pm. If you experience any cold or flu symptoms such as cough, fever, chills, shortness of breath, etc. between now and your scheduled surgery, please notify us at the above number.     Remember:  Do not eat or drink after midnight the night before your surgery    Take these medicines the morning of surgery with A SIP OF WATER  ADVAIR HFA- bring inhaler with you on day of surgery amiodarone (PACERONE)  Carboxymeth-Glycerin-Polysorb (REFRESH OPTIVE ADVANCED)  escitalopram (LEXAPRO)  esomeprazole (NEXIUM)  levothyroxine (SYNTHROID)  SYSTANE ULTRA eye drops    May take these medicines IF NEEDED: acetaminophen (TYLENOL)  albuterol (VENTOLIN HFA)  ALPRAZolam (XANAX)  fluticasone (FLONASE)  ipratropium-albuterol (DUONEB)  ketotifen (ZADITOR) eye drops loratadine (CLARITIN)  nitroGLYCERIN (NITROSTAT)- If you have to take this medication prior to surgery, please call [phone] and report this to a nurse  olopatadine (PATANOL) eye drops ondansetron (ZOFRAN)  sodium chloride (OCEAN) nasal spray   Follow your surgeon's instructions on when to stop apixaban (ELIQUIS).  If no instructions were given by your surgeon then you will need to call the office to get those instructions.    One week prior to surgery, STOP taking any Aspirin (unless otherwise instructed by your surgeon) Aleve, Naproxen, Ibuprofen, Motrin, Advil, Goody's, BC's, all herbal medications, fish oil, and non-prescription vitamins. This includes diclofenac Sodium (VOLTAREN) gel.                     Do NOT Smoke (Tobacco/Vaping) for 24 hours prior to your procedure.  If you use a CPAP at night, you may bring your  mask/headgear for your overnight stay.   You will be asked to remove any contacts, glasses, piercing's, hearing aid's, dentures/partials prior to surgery. Please bring cases for these items if needed.    Patients discharged the day of surgery will not be allowed to drive home, and someone needs to stay with them for 24 hours.  SURGICAL WAITING ROOM VISITATION Patients may have no more than 2 support people in the waiting area - these visitors may rotate.   Pre-op nurse will coordinate an appropriate time for 1 ADULT support person, who may not rotate, to accompany patient in pre-op.  Children under the age of 62 must have an adult with them who is not the patient and must remain in the main waiting area with an adult.  If the patient needs to stay at the hospital during part of their recovery, the visitor guidelines for inpatient rooms apply.  Please refer to the [facility] website for the visitor guidelines for any additional information.   If you received a COVID test during your pre-op visit  it is requested that you wear a mask when out in public, stay away from anyone that may not be feeling well and notify your surgeon if you develop symptoms. If you have been in contact with anyone that has tested positive in the last 10 days please notify you surgeon.      Pre-operative CHG Bathing Instructions   You can play a key role in reducing the risk of infection after surgery. Your skin needs to be as free of germs as possible. You can reduce the  number of germs on your skin by washing with CHG (chlorhexidine gluconate) soap before surgery. CHG is an antiseptic soap that kills germs and continues to kill germs even after washing.   DO NOT use if you have an allergy to chlorhexidine/CHG or antibacterial soaps. If your skin becomes reddened or irritated, stop using the CHG and notify one of our RNs at [phone].              TAKE A SHOWER THE NIGHT BEFORE SURGERY AND THE DAY OF SURGERY     Please keep in mind the following:  DO NOT shave, including legs and underarms, 48 hours prior to surgery.   You may shave your face before/day of surgery.  Place clean sheets on your bed the night before surgery Use a clean washcloth (not used since being washed) for each shower. DO NOT sleep with pet's night before surgery.  CHG Shower Instructions:  Wash your face and private area with normal soap. If you choose to wash your hair, wash first with your normal shampoo.  After you use shampoo/soap, rinse your hair and body thoroughly to remove shampoo/soap residue.  Turn the water OFF and apply half the bottle of CHG soap to a CLEAN washcloth.  Apply CHG soap ONLY FROM YOUR NECK DOWN TO YOUR TOES (washing for 3-5 minutes)  DO NOT use CHG soap on face, private areas, open wounds, or sores.  Pay special attention to the area where your surgery is being performed.  If you are having back surgery, having someone wash your back for you may be helpful. Wait 2 minutes after CHG soap is applied, then you may rinse off the CHG soap.  Pat dry with a clean towel  Put on clean pajamas    Additional instructions for the day of surgery: DO NOT APPLY any lotions, deodorants, cologne, or perfumes.   Do not wear jewelry or makeup Do not wear nail polish, gel polish, artificial nails, or any other type of covering on natural nails (fingers and toes) Do not bring valuables to the hospital. [facility] is not responsible for valuables/personal belongings. Put on clean/comfortable clothes.  Please brush your teeth.  Ask your nurse before applying any prescription medications to the skin.

## 2023-05-28 ENCOUNTER — Encounter: Payer: Self-pay | Admitting: Oncology

## 2023-05-29 ENCOUNTER — Inpatient Hospital Stay (HOSPITAL_COMMUNITY): Payer: Medicare (Managed Care) | Admitting: Anesthesiology

## 2023-05-29 ENCOUNTER — Inpatient Hospital Stay (HOSPITAL_COMMUNITY)
Admission: RE | Admit: 2023-05-29 | Discharge: 2023-05-30 | DRG: 167 | Disposition: A | Discharge status: Home Health | Payer: Medicare (Managed Care) | Attending: Thoracic Surgery (Cardiothoracic Vascular Surgery) | Admitting: Thoracic Surgery (Cardiothoracic Vascular Surgery)

## 2023-05-29 ENCOUNTER — Other Ambulatory Visit: Payer: Self-pay

## 2023-05-29 ENCOUNTER — Encounter (HOSPITAL_COMMUNITY)
Admission: RE | Disposition: A | Discharge status: Home / Self Care | Payer: Self-pay | Source: Home / Self Care | Attending: Thoracic Surgery (Cardiothoracic Vascular Surgery)

## 2023-05-29 ENCOUNTER — Ambulatory Visit: Payer: Medicare (Managed Care) | Admitting: Physician Assistant

## 2023-05-29 ENCOUNTER — Encounter (HOSPITAL_COMMUNITY): Payer: Self-pay | Admitting: Thoracic Surgery (Cardiothoracic Vascular Surgery)

## 2023-05-29 ENCOUNTER — Inpatient Hospital Stay (HOSPITAL_COMMUNITY): Payer: Medicare (Managed Care)

## 2023-05-29 DIAGNOSIS — R5381 Other malaise: Secondary | ICD-10-CM | POA: Diagnosis present

## 2023-05-29 DIAGNOSIS — I482 Chronic atrial fibrillation, unspecified: Secondary | ICD-10-CM | POA: Diagnosis present

## 2023-05-29 DIAGNOSIS — E119 Type 2 diabetes mellitus without complications: Secondary | ICD-10-CM | POA: Diagnosis present

## 2023-05-29 DIAGNOSIS — Z9981 Dependence on supplemental oxygen: Secondary | ICD-10-CM

## 2023-05-29 DIAGNOSIS — Z831 Family history of other infectious and parasitic diseases: Secondary | ICD-10-CM

## 2023-05-29 DIAGNOSIS — E039 Hypothyroidism, unspecified: Secondary | ICD-10-CM | POA: Diagnosis not present

## 2023-05-29 DIAGNOSIS — J4489 Other specified chronic obstructive pulmonary disease: Secondary | ICD-10-CM | POA: Diagnosis present

## 2023-05-29 DIAGNOSIS — J90 Pleural effusion, not elsewhere classified: Secondary | ICD-10-CM

## 2023-05-29 DIAGNOSIS — C8590 Non-Hodgkin lymphoma, unspecified, unspecified site: Secondary | ICD-10-CM

## 2023-05-29 DIAGNOSIS — C8589 Other specified types of non-Hodgkin lymphoma, extranodal and solid organ sites: Secondary | ICD-10-CM | POA: Diagnosis present

## 2023-05-29 DIAGNOSIS — Z881 Allergy status to other antibiotic agents status: Secondary | ICD-10-CM | POA: Diagnosis not present

## 2023-05-29 DIAGNOSIS — Z885 Allergy status to narcotic agent status: Secondary | ICD-10-CM | POA: Diagnosis not present

## 2023-05-29 DIAGNOSIS — R042 Hemoptysis: Secondary | ICD-10-CM | POA: Diagnosis present

## 2023-05-29 DIAGNOSIS — Z7901 Long term (current) use of anticoagulants: Secondary | ICD-10-CM | POA: Diagnosis not present

## 2023-05-29 DIAGNOSIS — E890 Postprocedural hypothyroidism: Secondary | ICD-10-CM | POA: Diagnosis present

## 2023-05-29 DIAGNOSIS — K219 Gastro-esophageal reflux disease without esophagitis: Secondary | ICD-10-CM | POA: Diagnosis present

## 2023-05-29 DIAGNOSIS — Z841 Family history of disorders of kidney and ureter: Secondary | ICD-10-CM

## 2023-05-29 DIAGNOSIS — Z79899 Other long term (current) drug therapy: Secondary | ICD-10-CM | POA: Diagnosis not present

## 2023-05-29 DIAGNOSIS — Z96612 Presence of left artificial shoulder joint: Secondary | ICD-10-CM | POA: Diagnosis present

## 2023-05-29 DIAGNOSIS — Z79890 Hormone replacement therapy (postmenopausal): Secondary | ICD-10-CM | POA: Diagnosis not present

## 2023-05-29 DIAGNOSIS — I10 Essential (primary) hypertension: Secondary | ICD-10-CM | POA: Diagnosis present

## 2023-05-29 DIAGNOSIS — Z7951 Long term (current) use of inhaled steroids: Secondary | ICD-10-CM | POA: Diagnosis not present

## 2023-05-29 DIAGNOSIS — Z96651 Presence of right artificial knee joint: Secondary | ICD-10-CM | POA: Diagnosis present

## 2023-05-29 DIAGNOSIS — Z86718 Personal history of other venous thrombosis and embolism: Secondary | ICD-10-CM | POA: Diagnosis not present

## 2023-05-29 DIAGNOSIS — Z91041 Radiographic dye allergy status: Secondary | ICD-10-CM

## 2023-05-29 DIAGNOSIS — K7460 Unspecified cirrhosis of liver: Secondary | ICD-10-CM | POA: Diagnosis present

## 2023-05-29 HISTORY — PX: CHEST TUBE INSERTION: SHX231

## 2023-05-29 HISTORY — PX: VIDEO ASSISTED THORACOSCOPY: SHX5073

## 2023-05-29 LAB — GLUCOSE, CAPILLARY
Glucose-Capillary: 120 mg/dL — ABNORMAL HIGH (ref 70–99)
Glucose-Capillary: 133 mg/dL — ABNORMAL HIGH (ref 70–99)

## 2023-05-29 SURGERY — VIDEO ASSISTED THORACOSCOPY
Anesthesia: General | Laterality: Right

## 2023-05-29 MED ORDER — FENTANYL CITRATE (PF) 100 MCG/2ML IJ SOLN: 50.0000 ug | Freq: Once | INTRAMUSCULAR | Status: AC

## 2023-05-29 MED ORDER — VANCOMYCIN HCL IN DEXTROSE 1-5 GM/200ML-% IV SOLN
1000.0000 mg | INTRAVENOUS | Status: AC
  Administered 2023-05-29: 1000 mg via INTRAVENOUS
  Filled 2023-05-29: qty 200

## 2023-05-29 MED ORDER — 0.9 % SODIUM CHLORIDE (POUR BTL) OPTIME
TOPICAL | Status: DC | PRN
  Administered 2023-05-29: 1000 mL

## 2023-05-29 MED ORDER — BISACODYL 5 MG PO TBEC
10.0000 mg | DELAYED_RELEASE_TABLET | Freq: Every day | ORAL | Status: DC
  Filled 2023-05-29: qty 2

## 2023-05-29 MED ORDER — ALBUTEROL SULFATE HFA 108 (90 BASE) MCG/ACT IN AERS: 2.0000 | INHALATION_SPRAY | RESPIRATORY_TRACT | Status: DC | PRN

## 2023-05-29 MED ORDER — ACETAMINOPHEN 500 MG PO TABS
1000.0000 mg | ORAL_TABLET | Freq: Four times a day (QID) | ORAL | Status: DC
  Administered 2023-05-29 – 2023-05-30 (×2): 1000 mg via ORAL
  Filled 2023-05-29 (×2): qty 2

## 2023-05-29 MED ORDER — DEXAMETHASONE SODIUM PHOSPHATE 10 MG/ML IJ SOLN
INTRAMUSCULAR | Status: DC | PRN
  Administered 2023-05-29: 5 mg via INTRAVENOUS

## 2023-05-29 MED ORDER — PROPOFOL 10 MG/ML IV BOLUS
INTRAVENOUS | Status: AC
  Filled 2023-05-29: qty 20

## 2023-05-29 MED ORDER — ROCURONIUM BROMIDE 10 MG/ML (PF) SYRINGE
PREFILLED_SYRINGE | INTRAVENOUS | Status: DC | PRN
  Administered 2023-05-29: 50 mg via INTRAVENOUS

## 2023-05-29 MED ORDER — AMIODARONE HCL 100 MG PO TABS
100.0000 mg | ORAL_TABLET | Freq: Every day | ORAL | Status: DC
  Filled 2023-05-29: qty 1

## 2023-05-29 MED ORDER — DEXTROMETHORPHAN-GUAIFENESIN 10-100 MG/5ML PO LIQD: 10.0000 mL | Freq: Three times a day (TID) | ORAL | Status: DC | PRN

## 2023-05-29 MED ORDER — PANTOPRAZOLE SODIUM 40 MG PO TBEC
40.0000 mg | DELAYED_RELEASE_TABLET | Freq: Every day | ORAL | Status: DC
  Administered 2023-05-30: 40 mg via ORAL
  Filled 2023-05-29: qty 1

## 2023-05-29 MED ORDER — POLYVINYL ALCOHOL 1.4 % OP SOLN: 2.0000 [drp] | Freq: Four times a day (QID) | OPHTHALMIC | Status: DC | PRN

## 2023-05-29 MED ORDER — MELATONIN 3 MG PO TABS
3.0000 mg | ORAL_TABLET | Freq: Every day | ORAL | Status: DC
  Administered 2023-05-29: 3 mg via ORAL
  Filled 2023-05-29: qty 1

## 2023-05-29 MED ORDER — POLYETHYLENE GLYCOL 3350 17 G PO PACK
17.0000 g | PACK | Freq: Every day | ORAL | Status: DC
  Filled 2023-05-29: qty 1

## 2023-05-29 MED ORDER — BUSPIRONE HCL 10 MG PO TABS
5.0000 mg | ORAL_TABLET | Freq: Three times a day (TID) | ORAL | Status: DC | PRN
  Filled 2023-05-29: qty 1

## 2023-05-29 MED ORDER — SODIUM CHLORIDE FLUSH 0.9 % IV SOLN
INTRAVENOUS | Status: DC | PRN
  Administered 2023-05-29: 100 mL

## 2023-05-29 MED ORDER — FENTANYL CITRATE (PF) 100 MCG/2ML IJ SOLN
INTRAMUSCULAR | Status: AC
  Administered 2023-05-29: 50 ug via INTRAVENOUS
  Filled 2023-05-29: qty 2

## 2023-05-29 MED ORDER — ONDANSETRON HCL 4 MG/2ML IJ SOLN
INTRAMUSCULAR | Status: DC | PRN
  Administered 2023-05-29: 4 mg via INTRAVENOUS

## 2023-05-29 MED ORDER — LEVOTHYROXINE SODIUM 25 MCG PO TABS
125.0000 ug | ORAL_TABLET | Freq: Every day | ORAL | Status: DC
  Administered 2023-05-30: 125 ug via ORAL
  Filled 2023-05-29: qty 1

## 2023-05-29 MED ORDER — MIDAZOLAM HCL 2 MG/2ML IJ SOLN: 1.0000 mg | Freq: Once | INTRAMUSCULAR | Status: AC

## 2023-05-29 MED ORDER — KETOROLAC TROMETHAMINE 15 MG/ML IJ SOLN
15.0000 mg | Freq: Four times a day (QID) | INTRAMUSCULAR | Status: DC
  Administered 2023-05-29 – 2023-05-30 (×2): 15 mg via INTRAVENOUS
  Filled 2023-05-29 (×2): qty 1

## 2023-05-29 MED ORDER — ENSURE ENLIVE PO LIQD
237.0000 mL | Freq: Every day | ORAL | Status: DC
  Administered 2023-05-29 – 2023-05-30 (×2): 237 mL via ORAL

## 2023-05-29 MED ORDER — VANCOMYCIN HCL IN DEXTROSE 1-5 GM/200ML-% IV SOLN
1000.0000 mg | Freq: Two times a day (BID) | INTRAVENOUS | Status: AC
  Administered 2023-05-29: 1000 mg via INTRAVENOUS
  Filled 2023-05-29: qty 200

## 2023-05-29 MED ORDER — SUGAMMADEX SODIUM 200 MG/2ML IV SOLN
INTRAVENOUS | Status: DC | PRN
  Administered 2023-05-29: 200 mg via INTRAVENOUS

## 2023-05-29 MED ORDER — ONDANSETRON HCL 4 MG/2ML IJ SOLN: 4.0000 mg | Freq: Four times a day (QID) | INTRAMUSCULAR | Status: DC | PRN

## 2023-05-29 MED ORDER — LACTATED RINGERS IV SOLN: INTRAVENOUS | Status: DC

## 2023-05-29 MED ORDER — BUPIVACAINE HCL (PF) 0.5 % IJ SOLN
INTRAMUSCULAR | Status: AC
  Filled 2023-05-29: qty 30

## 2023-05-29 MED ORDER — PROPOFOL 10 MG/ML IV BOLUS
INTRAVENOUS | Status: DC | PRN
  Administered 2023-05-29: 50 mg via INTRAVENOUS
  Administered 2023-05-29: 120 mg via INTRAVENOUS

## 2023-05-29 MED ORDER — ALPRAZOLAM 0.25 MG PO TABS: 0.2500 mg | ORAL_TABLET | Freq: Four times a day (QID) | ORAL | Status: DC | PRN

## 2023-05-29 MED ORDER — GUAIFENESIN ER 600 MG PO TB12: 600.0000 mg | ORAL_TABLET | Freq: Every day | ORAL | Status: DC

## 2023-05-29 MED ORDER — ROPINIROLE HCL 0.25 MG PO TABS
0.2500 mg | ORAL_TABLET | Freq: Every day | ORAL | Status: DC
  Filled 2023-05-29 (×2): qty 1

## 2023-05-29 MED ORDER — FENTANYL CITRATE (PF) 100 MCG/2ML IJ SOLN
INTRAMUSCULAR | Status: AC
  Filled 2023-05-29: qty 2

## 2023-05-29 MED ORDER — LIDOCAINE HCL (CARDIAC) PF 100 MG/5ML IV SOSY
PREFILLED_SYRINGE | INTRAVENOUS | Status: DC | PRN
  Administered 2023-05-29: 50 mg via INTRATRACHEAL

## 2023-05-29 MED ORDER — ORAL CARE MOUTH RINSE: 15.0000 mL | Freq: Once | OROMUCOSAL | Status: AC

## 2023-05-29 MED ORDER — FUROSEMIDE 40 MG PO TABS
40.0000 mg | ORAL_TABLET | Freq: Every day | ORAL | Status: DC
  Administered 2023-05-30: 40 mg via ORAL
  Filled 2023-05-29: qty 1

## 2023-05-29 MED ORDER — FENTANYL CITRATE (PF) 100 MCG/2ML IJ SOLN
INTRAMUSCULAR | Status: DC | PRN
Start: 2023-05-29 — End: 2023-05-29
  Administered 2023-05-29 (×2): 50 ug via INTRAVENOUS

## 2023-05-29 MED ORDER — PHENYLEPHRINE HCL-NACL 20-0.9 MG/250ML-% IV SOLN
INTRAVENOUS | Status: DC | PRN
  Administered 2023-05-29: 30 ug/min via INTRAVENOUS

## 2023-05-29 MED ORDER — MOMETASONE FURO-FORMOTEROL FUM 200-5 MCG/ACT IN AERO
2.0000 | INHALATION_SPRAY | Freq: Two times a day (BID) | RESPIRATORY_TRACT | Status: DC
  Administered 2023-05-30: 2 via RESPIRATORY_TRACT
  Filled 2023-05-29: qty 8.8

## 2023-05-29 MED ORDER — ENOXAPARIN SODIUM 40 MG/0.4ML IJ SOSY: 40.0000 mg | PREFILLED_SYRINGE | Freq: Every day | INTRAMUSCULAR | Status: DC

## 2023-05-29 MED ORDER — ACETAMINOPHEN 160 MG/5ML PO SOLN: 1000.0000 mg | Freq: Four times a day (QID) | ORAL | Status: DC

## 2023-05-29 MED ORDER — ESCITALOPRAM OXALATE 10 MG PO TABS
10.0000 mg | ORAL_TABLET | Freq: Every day | ORAL | Status: DC
Start: 2023-05-30 — End: 2023-05-29

## 2023-05-29 MED ORDER — MIDAZOLAM HCL 2 MG/2ML IJ SOLN
INTRAMUSCULAR | Status: AC
  Administered 2023-05-29: 1 mg via INTRAVENOUS
  Filled 2023-05-29: qty 2

## 2023-05-29 MED ORDER — CHLORHEXIDINE GLUCONATE CLOTH 2 % EX PADS
6.0000 | MEDICATED_PAD | Freq: Every day | CUTANEOUS | Status: DC
  Administered 2023-05-30: 6 via TOPICAL

## 2023-05-29 MED ORDER — BUPIVACAINE LIPOSOME 1.3 % IJ SUSP
INTRAMUSCULAR | Status: AC
  Filled 2023-05-29: qty 20

## 2023-05-29 MED ORDER — CHLORHEXIDINE GLUCONATE 0.12 % MT SOLN
15.0000 mL | Freq: Once | OROMUCOSAL | Status: AC
  Administered 2023-05-29: 15 mL via OROMUCOSAL
  Filled 2023-05-29: qty 15

## 2023-05-29 MED ORDER — TRAZODONE HCL 50 MG PO TABS
50.0000 mg | ORAL_TABLET | Freq: Every evening | ORAL | Status: DC | PRN
  Administered 2023-05-29: 50 mg via ORAL
  Filled 2023-05-29: qty 1

## 2023-05-29 MED ORDER — TRAMADOL HCL 50 MG PO TABS
50.0000 mg | ORAL_TABLET | Freq: Four times a day (QID) | ORAL | Status: DC | PRN
  Administered 2023-05-29 – 2023-05-30 (×3): 50 mg via ORAL
  Filled 2023-05-29 (×3): qty 1

## 2023-05-29 MED ORDER — APIXABAN 2.5 MG PO TABS
2.5000 mg | ORAL_TABLET | Freq: Two times a day (BID) | ORAL | Status: DC
Start: 2023-05-30 — End: 2023-05-30

## 2023-05-29 MED ORDER — FERROUS SULFATE 325 (65 FE) MG PO TABS: 325.0000 mg | ORAL_TABLET | ORAL | Status: DC

## 2023-05-29 MED ORDER — FLUTICASONE PROPIONATE 50 MCG/ACT NA SUSP: 2.0000 | Freq: Every day | NASAL | Status: DC | PRN

## 2023-05-29 MED ORDER — IPRATROPIUM-ALBUTEROL 0.5-2.5 (3) MG/3ML IN SOLN: 3.0000 mL | RESPIRATORY_TRACT | Status: DC | PRN

## 2023-05-29 MED ORDER — SENNOSIDES-DOCUSATE SODIUM 8.6-50 MG PO TABS: 1.0000 | ORAL_TABLET | Freq: Every day | ORAL | Status: DC

## 2023-05-29 MED ORDER — FENTANYL CITRATE (PF) 100 MCG/2ML IJ SOLN
25.0000 ug | INTRAMUSCULAR | Status: DC | PRN
  Administered 2023-05-29 (×3): 50 ug via INTRAVENOUS

## 2023-05-29 SURGICAL SUPPLY — 39 items
BLADE CLIPPER SURG (BLADE) ×1 IMPLANT
CANISTER SUCT 3000ML PPV (MISCELLANEOUS) ×1 IMPLANT
CNTNR URN SCR LID CUP LEK RST (MISCELLANEOUS) IMPLANT
COVER SURGICAL LIGHT HANDLE (MISCELLANEOUS) ×1 IMPLANT
DEFOGGER SCOPE WARMER CLEARIFY (MISCELLANEOUS) IMPLANT
DERMABOND ADVANCED .7 DNX12 (GAUZE/BANDAGES/DRESSINGS) ×1 IMPLANT
DRAPE CV SPLIT W-CLR ANES SCRN (DRAPES) IMPLANT
DRAPE LAPAROSCOPIC ABDOMINAL (DRAPES) ×1 IMPLANT
DRAPE SURG ORHT 6 SPLT 77X108 (DRAPES) IMPLANT
ELECT BLADE 6.5 EXT (BLADE) IMPLANT
GAUZE 4X4 16PLY ~~LOC~~+RFID DBL (SPONGE) ×1 IMPLANT
GLOVE BIOGEL M 7.0 STRL (GLOVE) ×1 IMPLANT
GOWN STRL REUS W/ TWL LRG LVL3 (GOWN DISPOSABLE) ×1 IMPLANT
GOWN STRL REUS W/ TWL XL LVL3 (GOWN DISPOSABLE) ×1 IMPLANT
KIT BASIN OR (CUSTOM PROCEDURE TRAY) ×1 IMPLANT
KIT PLEURX DRAIN CATH 1000ML (MISCELLANEOUS) IMPLANT
KIT PLEURX DRAIN CATH 15.5FR (DRAIN) ×1 IMPLANT
KIT TURNOVER KIT B (KITS) ×1 IMPLANT
NDL 22X1.5 STRL (OR ONLY) (MISCELLANEOUS) IMPLANT
NEEDLE 22X1.5 STRL (OR ONLY) (MISCELLANEOUS)
NS IRRIG 1000ML POUR BTL (IV SOLUTION) ×1 IMPLANT
PACK CHEST (CUSTOM PROCEDURE TRAY) IMPLANT
PACK GENERAL/GYN (CUSTOM PROCEDURE TRAY) ×1 IMPLANT
PAD ARMBOARD 7.5X6 YLW CONV (MISCELLANEOUS) ×2 IMPLANT
SET DRAINAGE LINE (MISCELLANEOUS) IMPLANT
SPONGE T-LAP 18X18 ~~LOC~~+RFID (SPONGE) ×4 IMPLANT
SUT ETHILON 3 0 FSL (SUTURE) ×1 IMPLANT
SUT SILK 1 MH (SUTURE) IMPLANT
SUT VIC AB 2-0 CT1 TAPERPNT 27 (SUTURE) IMPLANT
SUT VIC AB 3-0 SH 27X BRD (SUTURE) ×1 IMPLANT
SUT VICRYL 0 UR6 27IN ABS (SUTURE) IMPLANT
SYR CONTROL 10ML LL (SYRINGE) ×1 IMPLANT
SYSTEM SAHARA CHEST DRAIN ATS (WOUND CARE) IMPLANT
TOWEL GREEN STERILE (TOWEL DISPOSABLE) ×1 IMPLANT
TOWEL GREEN STERILE FF (TOWEL DISPOSABLE) ×1 IMPLANT
TROCAR XCEL BLADELESS 5X75MML (TROCAR) IMPLANT
TUBE CONNECTING 12X1/4 (SUCTIONS) ×1 IMPLANT
VALVE REPLACEMENT CAP (MISCELLANEOUS) IMPLANT
WATER STERILE IRR 1000ML POUR (IV SOLUTION) ×1 IMPLANT

## 2023-05-29 NOTE — Anesthesia Procedure Notes (Signed)
Central Venous Catheter Insertion Performed by: Achille Rich, MD, anesthesiologist Start/End12/23/2024 10:40 AM, 05/29/2023 10:48 AM Patient location: Pre-op. Preanesthetic checklist: patient identified, IV checked, site marked, risks and benefits discussed, surgical consent, monitors and equipment checked, pre-op evaluation, timeout performed and anesthesia consent Lidocaine 1% used for infiltration and patient sedated Hand hygiene performed  and maximum sterile barriers used  Catheter size: 8 Fr Total catheter length 16. Central line was placed.Double lumen Procedure performed using ultrasound guided technique. Ultrasound Notes:image(s) printed for medical record Attempts: 1 Following insertion, dressing applied and line sutured. Post procedure assessment: blood return through all ports  Patient tolerated the procedure well with no immediate complications.

## 2023-05-29 NOTE — Anesthesia Procedure Notes (Signed)
Procedure Name: Intubation Date/Time: 05/29/2023 11:45 AM  Performed by: Yolonda Kida, CRNAPre-anesthesia Checklist: Patient identified, Emergency Drugs available, Suction available and Patient being monitored Patient Re-evaluated:Patient Re-evaluated prior to induction Oxygen Delivery Method: Circle System Utilized Preoxygenation: Pre-oxygenation with 100% oxygen Induction Type: IV induction Ventilation: Mask ventilation without difficulty Laryngoscope Size: Mac and 3 Endobronchial tube: Left, Double lumen EBT and EBT position confirmed by fiberoptic bronchoscope and 37 Fr Number of attempts: 1 Airway Equipment and Method: Stylet and Oral airway Placement Confirmation: ETT inserted through vocal cords under direct vision, positive ETCO2 and breath sounds checked- equal and bilateral Secured at: 27 cm Tube secured with: Tape Dental Injury: Teeth and Oropharynx as per pre-operative assessment

## 2023-05-29 NOTE — Anesthesia Preprocedure Evaluation (Signed)
Anesthesia Evaluation  Patient identified by MRN, date of birth, ID band Patient awake    Reviewed: Allergy & Precautions, H&P , NPO status , Patient's Chart, lab work & pertinent test results  Airway Mallampati: II   Neck ROM: full    Dental   Pulmonary shortness of breath, COPD   breath sounds clear to auscultation       Cardiovascular hypertension, + dysrhythmias Atrial Fibrillation  Rhythm:regular Rate:Normal     Neuro/Psych  PSYCHIATRIC DISORDERS Anxiety        GI/Hepatic hiatal hernia,GERD  ,,(+) Cirrhosis         Endo/Other  diabetes, Type 2Hypothyroidism    Renal/GU      Musculoskeletal  (+) Arthritis ,    Abdominal   Peds  Hematology lymphoma   Anesthesia Other Findings   Reproductive/Obstetrics                             Anesthesia Physical Anesthesia Plan  ASA: 3  Anesthesia Plan: General   Post-op Pain Management:    Induction: Intravenous  PONV Risk Score and Plan: 3 and Ondansetron, Dexamethasone and Treatment may vary due to age or medical condition  Airway Management Planned: Double Lumen EBT  Additional Equipment: Arterial line  Intra-op Plan:   Post-operative Plan: Extubation in OR  Informed Consent: I have reviewed the patients History and Physical, chart, labs and discussed the procedure including the risks, benefits and alternatives for the proposed anesthesia with the patient or authorized representative who has indicated his/her understanding and acceptance.     Dental advisory given  Plan Discussed with: CRNA, Anesthesiologist and Surgeon  Anesthesia Plan Comments:        Anesthesia Quick Evaluation

## 2023-05-29 NOTE — Transfer of Care (Signed)
Immediate Anesthesia Transfer of Care Note  Patient: [name]  Procedure(s) Performed: RIGHT VIDEO ASSISTED THORACOSCOPY FOR PLEURAL BIOPSY (Right) INSERTION PLEURAL DRAINAGE CATHETER (Right)  Patient Location: PACU  Anesthesia Type:General  Level of Consciousness: awake, drowsy, and patient cooperative  Airway & Oxygen Therapy: Patient Spontanous Breathing and Patient connected to face mask oxygen  Post-op Assessment: Report given to RN and Post -op Vital signs reviewed and stable  Post vital signs: Reviewed and stable  Last Vitals:  Vitals Value Taken Time  BP 142/75 05/29/23 1255  Temp    Pulse 67 05/29/23 1257  Resp 18 05/29/23 1257  SpO2 96 % 05/29/23 1257  Vitals shown include unfiled device data.  Last Pain:  Vitals:   05/29/23 0951  PainSc: 5       Patients Stated Pain Goal: 2 (05/29/23 0951)  Complications: No notable events documented.

## 2023-05-29 NOTE — Discharge Summary (Signed)
Physician Discharge Summary       [address] [place]       [place] [address]             [phone]    Patient ID: [name] MRN: [id number] DOB/AGE: [birth year] 78 y.o.  Admit date: 05/29/2023 Discharge date: 05/30/2023  Admission Diagnoses: Pleural effusion, concern for lymphoma  Discharge Diagnoses:  Principal Problem:   Pleural effusion   Patient Active Problem List   Diagnosis Date Noted   Pleural effusion 05/29/2023   Lymphoma (HCC) 04/26/2023   Palliative care encounter 04/26/2023   Shortness of breath 04/24/2023   Community acquired pneumonia 04/24/2023   Recurrent right pleural effusion 04/21/2023   Acute on chronic respiratory failure with hypoxia (HCC) 04/21/2023   GERD without esophagitis 04/21/2023   Paroxysmal atrial fibrillation (HCC) 04/21/2023   Pneumonia 04/21/2023   Anxiety 04/17/2023   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   Sacral fracture, closed (HCC) 04/15/2023   Fall 04/14/2023   Lumbar compression fracture (HCC) 04/14/2023   Unintentional weight loss 04/13/2023   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   Mood disorder (HCC) 04/13/2023   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) 04/12/2023   Pleural effusion on right 04/07/2023   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   Chronic kidney disease, stage 3a (HCC) 04/07/2023   Atrial fibrillation, chronic (HCC) 04/07/2023   DVT (deep venous thrombosis) (HCC) 04/07/2023   Leukocytosis 04/07/2023   Right foot pain 04/07/2023   Acute sinusitis 05/23/2022   COPD exacerbation (HCC) 05/22/2022   Atrial fibrillation with RVR (HCC) 05/22/2022   Chest pain 05/22/2022   HTN (hypertension) 05/22/2022   Diabetes mellitus without complication (HCC) 05/22/2022   COPD (chronic obstructive pulmonary disease) (HCC) 05/22/2022   Hypothyroidism 05/22/2022   Iron  deficiency anemia 05/22/2022   Osteoarthritis of right knee 03/14/2022     Consults: None  Procedure (s): Surgery  05/29/2023 Patient:  Ronita Hipps Pre-Op Dx: Recurrent pleural effusion Concern for lymphoma   Post-op Dx:  same Procedure: - Right video assisted thoracoscopy - Pleural biopsy - placement of pleurx catheter - Intercostal nerve block   Surgeon and Role:      * Lightfoot, Eliezer Lofts, MD - Primary    Webb Laws, PA-C - assisting    Referring: Darlin Priestly, MD Primary Care: [facility] Primary Cardiologist: None   History of Present Illness:    [name] 77 y.o. female presents for surgical evaluation of her recurrent right-sided pleural effusion.  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  She currently has minimal respiratory symptoms.  Dr. Cliffton Asters evaluated the patient and relevant studies and recommended pleural biopsy as well as placement of a Pleurx catheter.  Hospital course:  The patient was admitted electively and taken the operating room on 05/29/2023 at which time she underwent a right VATS with pleural biopsy and placement of a Pleurx catheter.  She tolerated the procedure well and was taken to the postanesthesia care unit in stable condition.    Postoperative hospital course:  The patient has done well.  He continues to require oxygen and she is known to have home oxygen in place.  She continues to have some hemoptysis which she was having previously.  H/H did drop more than anticipated based on procedure itself and in discussion with Dr. Cliffton Asters is felt we should hold  her apixaban for now and possibly consider restarting after he sees her in the office.  She has undergone Pleurx drain teaching as per routine management.  Additionally her son is receiving some training on this and will assist her at home.  Equipment has been ordered and she has bottles to take home.  She is otherwise stable.   Pathology is pending.  She will be discharged on today's date.    Latest Vital Signs: Blood pressure (!) 112/53, pulse 66, temperature 97.8 F (36.6 C), temperature source Oral, resp. rate 15, height 5' (1.524 m), weight 65.8 kg, SpO2 95%.  Physical Exam:General appearance: alert, cooperative, and no distress Heart: regular rate and rhythm Lungs: somewhat coarse throughout Abdomen: benign Extremities: no edema Wound: dressings intact, incis o  Discharge Condition: Stable  Recent laboratory studies:  Lab Results  Component Value Date   WBC 10.4 05/30/2023   HGB 8.6 (L) 05/30/2023   HCT 27.9 (L) 05/30/2023   MCV 95.2 05/30/2023   PLT 181 05/30/2023   Lab Results  Component Value Date   NA 136 05/30/2023   K 4.4 05/30/2023   CL 103 05/30/2023   CO2 29 05/30/2023   CREATININE 0.88 05/30/2023   GLUCOSE 140 (H) 05/30/2023      Diagnostic Studies: DG Chest Port 1 View Result Date: 05/30/2023 CLINICAL DATA:  Pleural effusion. EXAM: PORTABLE CHEST 1 VIEW COMPARISON:  May 29, 2023. FINDINGS: Stable cardiomegaly with mild central pulmonary vascular congestion. Status post left shoulder arthroplasty. Old left rib fractures are noted. Right internal jugular catheter is unchanged. Right perihilar atelectasis or infiltrate is noted. IMPRESSION: Stable cardiomegaly with mild central pulmonary vascular congestion. Right perihilar atelectasis or infiltrate is noted Electronically Signed   By: Lupita Raider M.D.   On: 05/30/2023 10:08   DG Chest 2 View Result Date: 05/29/2023 CLINICAL DATA:  Preoperative evaluation. History of lymphoma. Right pleural effusion. EXAM: CHEST - 2 VIEW COMPARISON:  05/23/2023 FINDINGS: Small right pleural effusion with basilar atelectasis or infiltration. Left lung is clear. No pneumothorax. Mediastinal contours appear intact. Normal heart size. Calcification of the aorta and mitral valve annulus. Postoperative changes in the left shoulder and right upper  quadrant. Calcified granuloma in the right upper lung. IMPRESSION: Small right pleural effusion with basilar atelectasis similar to prior study. Electronically Signed   By: Burman Nieves M.D.   On: 05/29/2023 17:17   DG Chest 2 View Result Date: 05/29/2023 CLINICAL DATA:  Pleural effusion.  Recent thoracentesis in November. EXAM: CHEST - 2 VIEW COMPARISON:  05/03/2023 FINDINGS: Small right pleural effusion is increasing since the previous study. Basilar atelectasis. Left lung is clear. Heart size is normal. Calcification of the mitral valve annulus and aorta. A loop recorder is present. No pneumothorax. Postoperative changes in the left shoulder. IMPRESSION: 1. Small but increasing right pleural effusion with basilar atelectasis. Electronically Signed   By: Burman Nieves M.D.   On: 05/29/2023 17:16   DG Chest Port 1 View Result Date: 05/29/2023 CLINICAL DATA:  Pleural effusion. Right video-assisted thoracoscopy for pleural biopsy. EXAM: PORTABLE CHEST 1 VIEW COMPARISON:  05/26/2023 FINDINGS: Cardiac enlargement. Minimal residual right pleural effusion, improved since prior study. A right chest tube has been placed. No significant pneumothorax. A right central venous catheter has been placed with tip projecting over the lower SVC region. Postoperative changes in the left shoulder. A loop recorder is present. Calcification of the aorta. Degenerative changes in the spine. IMPRESSION: 1. Decreased right pleural effusion with right chest  tube in place. No pneumothorax. 2. Cardiac enlargement. 3. Appliances appear in satisfactory position. Electronically Signed   By: Burman Nieves M.D.   On: 05/29/2023 17:15   DG Chest Port 1 View Result Date: 05/03/2023 CLINICAL DATA:  Post thoracentesis EXAM: PORTABLE CHEST 1 VIEW COMPARISON:  X-ray 04/29/2023 and older FINDINGS: Left shoulder reverse arthroplasty seen. Loop recorder. Normal cardiopericardial silhouette with calcified aorta. Prominent calcifications  along the mitral valve annulus. Decreasing tiny right pleural effusion. No pneumothorax or consolidation. No edema. Surgical clips in the right upper quadrant. Degenerative changes of the spine. IMPRESSION: Decreasing right pleural effusion. No pneumothorax post thoracentesis. Electronically Signed   By: Karen Kays M.D.   On: 05/03/2023 15:02   US THORACENTESIS ASP PLEURAL SPACE W/IMG GUIDE Result Date: 05/03/2023 INDICATION: 77 year old female with history of COPD, lymphoma with recurrent symptomatic right pleural effusion previous thoracentesis have been helpful with improvement in breathing. Request received for therapeutic right thoracentesis. EXAM: ULTRASOUND GUIDED RIGHT THORACENTESIS MEDICATIONS: 10 mL 1% lidocaine COMPLICATIONS: None immediate. PROCEDURE: An ultrasound guided thoracentesis was thoroughly discussed with the patient and questions answered. The benefits, risks, alternatives and complications were also discussed. The patient understands and wishes to proceed with the procedure. Written consent was obtained. Ultrasound was performed to localize and mark an adequate pocket of fluid in the right chest. The area was then prepped and draped in the normal sterile fashion. 1% Lidocaine was used for local anesthesia. Under ultrasound guidance a 6 Fr Safe-T-Centesis catheter was introduced. Thoracentesis was performed. The catheter was removed and a dressing applied. FINDINGS: A total of approximately 500 mL of amber fluid was removed. IMPRESSION: Successful ultrasound guided right thoracentesis yielding 500 mL of pleural fluid. Performed by: Loyce Dys PA-C Electronically Signed   By: Judie Petit.  Shick M.D.   On: 05/03/2023 13:47       Discharge Instructions     Discharge patient   Complete by: As directed    Discharge disposition: 01-Home or Self Care   Discharge patient date: 05/30/2023       Discharge Medications: Allergies as of 05/30/2023       Reactions   Cephalosporins  Itching   Codeine Other (See Comments)   Extreme drowsiness   Contrast Media [iodinated Contrast Media] Hives, Swelling   Facial swelling   Oxycodone Other (See Comments)   Extreme drowsiness        Medication List     STOP taking these medications    apixaban 2.5 MG Tabs tablet Commonly known as: ELIQUIS       TAKE these medications    acetaminophen 325 MG tablet Commonly known as: TYLENOL Take 650 mg by mouth every 6 (six) hours as needed for moderate pain (pain score 4-6).   acetaminophen 650 MG CR tablet Commonly known as: TYLENOL Take 1,300 mg by mouth in the morning and at bedtime.   Advair HFA 115-21 MCG/ACT inhaler Generic drug: fluticasone-salmeterol Inhale 2 puffs into the lungs 2 (two) times daily.   albuterol 108 (90 Base) MCG/ACT inhaler Commonly known as: VENTOLIN HFA Inhale 2-4 puffs by mouth every 4 hours as needed for wheezing, cough, and/or shortness of breath What changed:  how much to take how to take this when to take this reasons to take this additional instructions   ALPRAZolam 0.25 MG tablet Commonly known as: XANAX Take 1 tablet (0.25 mg total) by mouth 2 (two) times daily as needed for anxiety. What changed: when to take this   amiodarone  100 MG tablet Commonly known as: PACERONE Take 1 tablet (100 mg total) by mouth daily.   bismuth subsalicylate 262 MG/15ML suspension Commonly known as: PEPTO BISMOL Take 30 mLs by mouth every 6 (six) hours as needed for indigestion or diarrhea or loose stools.   busPIRone 5 MG tablet Commonly known as: BUSPAR Take 5 mg by mouth 3 (three) times daily as needed (anxiety).   dextromethorphan-guaiFENesin 10-100 MG/5ML liquid Commonly known as: ROBITUSSIN-DM Take 10 mLs by mouth 3 (three) times daily as needed for cough.   escitalopram 10 MG tablet Commonly known as: LEXAPRO Take 1 tablet (10 mg total) by mouth daily.   esomeprazole 40 MG capsule Commonly known as: NEXIUM Take 40 mg by  mouth 2 (two) times daily.   feeding supplement Liqd Take 237 mLs by mouth 3 (three) times daily between meals. What changed: when to take this   ferrous sulfate 325 (65 FE) MG tablet Take 325 mg by mouth 3 (three) times a week. Monday, Wednesday, Friday   FISH OIL PO Take 1 capsule by mouth daily.   fluticasone 50 MCG/ACT nasal spray Commonly known as: FLONASE Place 2 sprays into both nostrils daily as needed for allergies or rhinitis.   furosemide 40 MG tablet Commonly known as: LASIX Take 40 mg by mouth daily. What changed: Another medication with the same name was removed. Continue taking this medication, and follow the directions you see here.   guaiFENesin 600 MG 12 hr tablet Commonly known as: MUCINEX Take 600 mg by mouth daily.   ipratropium-albuterol 0.5-2.5 (3) MG/3ML Soln Commonly known as: DUONEB Take 3 mLs by nebulization every 4 (four) hours as needed (wheezing/SOB).   levothyroxine 125 MCG tablet Commonly known as: SYNTHROID Take 125 mcg by mouth daily before breakfast.   Magnesium 250 MG Tabs Take 1 tablet by mouth daily.   multivitamin with minerals Tabs tablet Take 1 tablet by mouth daily.   nitroGLYCERIN 0.4 MG SL tablet Commonly known as: NITROSTAT Place 0.4 mg under the tongue every 5 (five) minutes as needed for chest pain.   OVER THE COUNTER MEDICATION Take 465 mg by mouth daily. Black seed cumin echinacea   OXYGEN Inhale 2 L into the lungs at bedtime.   Phillips Milk of Magnesia 400 MG/5ML suspension Generic drug: magnesium hydroxide Take 30 mLs by mouth daily as needed for indigestion.   polyethylene glycol 17 g packet Commonly known as: MIRALAX / GLYCOLAX Take 17 g by mouth daily.   potassium chloride 10 MEQ tablet Commonly known as: KLOR-CON Take 10 mEq by mouth daily.   potassium chloride 8 MEQ tablet Commonly known as: KLOR-CON Take 8 mEq by mouth at bedtime.   Refresh Optive Advanced 0.5-1-0.5 % Soln Generic drug:  Carboxymeth-Glycerin-Polysorb Apply 2 drops to eye 4 (four) times daily as needed (allergies).   rOPINIRole 0.25 MG tablet Commonly known as: REQUIP Take 0.25 mg by mouth at bedtime.   senna 8.6 MG Tabs tablet Commonly known as: SENOKOT Take 2 tablets by mouth in the morning and at bedtime.   sodium chloride 0.65 % Soln nasal spray Commonly known as: OCEAN Place 1 spray into both nostrils as needed for congestion.   traZODone 100 MG tablet Commonly known as: DESYREL Take 100 mg by mouth at bedtime. What changed: Another medication with the same name was removed. Continue taking this medication, and follow the directions you see here.   TURMERIC PO Take 1,900 mg by mouth daily.   VITAMIN C CR 1500  MG Tbcr Take 1 tablet by mouth daily.   VITAMIN D-VITAMIN K PO Take 1 capsule by mouth at bedtime.        Follow Up Appointments:  Follow-up Information     Lightfoot, Eliezer Lofts, MD Follow up.   Specialty: Cardiothoracic Surgery Why: Please see discharge paperwork for details of follow-up appointment with Dr. Cliffton Asters. Contact information: [address] [place] [address] [phone]                 Signed: Noel Christmas 05/30/2023, 11:03 AM

## 2023-05-29 NOTE — Interval H&P Note (Signed)
History and Physical Interval Note:  05/29/2023 10:36 AM  [name]  has presented today for surgery, with the diagnosis of Lymphoma.  The various methods of treatment have been discussed with the patient and family. After consideration of risks, benefits and other options for treatment, the patient has consented to  Procedure(s): RIGHT VIDEO ASSISTED THORACOSCOPY FOR PLEURAL BIOPSY (Right) INSERTION PLEURAL DRAINAGE CATHETER (Right) as a surgical intervention.  The patient's history has been reviewed, patient examined, no change in status, stable for surgery.  I have reviewed the patient's chart and labs.  Questions were answered to the patient's satisfaction.     [name]

## 2023-05-29 NOTE — Hospital Course (Signed)
  Referring: Darlin Priestly, MD Primary Care: [facility] Primary Cardiologist: None   History of Present Illness:    [name] 77 y.o. female presents for surgical evaluation of her recurrent right-sided pleural effusion.  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  She currently has minimal respiratory symptoms.  Dr. Cliffton Asters evaluated the patient and relevant studies and recommended pleural biopsy as well as placement of a Pleurx catheter.  Hospital course:  The patient was admitted electively and taken the operating room on 05/29/2023 at which time she underwent a right VATS with pleural biopsy and placement of a Pleurx catheter.  She tolerated the procedure well and was taken to the postanesthesia care unit in stable condition.    Postoperative hospital course:  The patient has done well.  He continues to require oxygen and she is known to have home oxygen in place.  She continues to have some hemoptysis which she was having previously.  H/H did drop more than anticipated based on procedure itself and in discussion with Dr. Cliffton Asters is felt we should hold her apixaban for now and possibly consider restarting after he sees her in the office.  She has undergone Pleurx drain teaching as per routine management.  Additionally her son is receiving some training on this and will assist her at home.  Equipment has been ordered and she has bottles to take home.  She is otherwise stable.  Pathology is pending.  She will be discharged on today's date.

## 2023-05-29 NOTE — Op Note (Signed)
     [address]       [place] [address]             [phone]       05/29/2023 Patient:  [name] Pre-Op Dx: Recurrent pleural effusion Concern for lymphoma   Post-op Dx:  same Procedure: - Right video assisted thoracoscopy - Pleural biopsy - placement of pleurx catheter - Intercostal nerve block  Surgeon and Role:      * Tyrell Brereton, Eliezer Lofts, MD - Primary    Webb Laws, PA-C - assisting An experienced assistant was required given the complexity of this surgery and the standard of surgical care. The assistant was needed for exposure, dissection, suctioning, retraction of delicate tissues and sutures, instrument exchange and for overall help during this procedure.     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 We discussed the risks and benefits of a right video-assisted thoracoscopy with pleural biopsy. The patient would also wish to proceed with a Pleurx catheter placement at the same time.   Findings: Serous pleural fluid.  Thickened parietal pleural.  Pleural adhesions.  Operative Technique: After the risks, benefits and alternatives were thoroughly discussed, the patient was brought to the operative theatre.  Anesthesia was induced, the patient was then placed in a left decubitus position and was prepped and draped in normal sterile fashion.  An appropriate surgical pause was performed, and pre-operative antibiotics were dosed accordingly.  We began with 2cm incision in the anterior axillary line at the 5th intercostal space.  The chest was entered, and we then placed a 1cm incision at the 10th intercostal space, and introduced our camera port.  The lung was directly visualized.  The parietal pleural was biopsied in the  posterior gutter.  Next, a subcutaneous tunnel was created, and the pleurx catheter was then passed through it, and introduced into the chest.  It was positioned in the posterior gutter.      An intercostal nerve block was performed under direct visualization.  WWe watch the lung re-expand.  The skin and soft tissue were closed with absorbable suture    The patient tolerated the procedure without any immediate complications, and was transferred to the PACU in stable condition.  [name] Keane Scrape

## 2023-05-29 NOTE — Anesthesia Procedure Notes (Signed)
Procedure Name: Intubation Date/Time: 05/29/2023 11:45 AM  Performed by: Yolonda Kida, CRNAPre-anesthesia Checklist: Patient identified, Emergency Drugs available, Suction available and Patient being monitored Patient Re-evaluated:Patient Re-evaluated prior to induction Oxygen Delivery Method: Circle System Utilized Preoxygenation: Pre-oxygenation with 100% oxygen Induction Type: IV induction Ventilation: Mask ventilation without difficulty and Oral airway inserted - appropriate to patient size Laryngoscope Size: Mac and 3 Grade View: Grade I Tube type: Oral Endobronchial tube: Left and EBT position confirmed by fiberoptic bronchoscope and 37 Fr Number of attempts: 1 Airway Equipment and Method: Stylet and Oral airway Placement Confirmation: ETT inserted through vocal cords under direct vision, positive ETCO2 and breath sounds checked- equal and bilateral Secured at: 27 cm Tube secured with: Tape Dental Injury: Teeth and Oropharynx as per pre-operative assessment

## 2023-05-29 NOTE — Brief Op Note (Signed)
05/29/2023  12:36 PM  PATIENT:  [name]  77 y.o. female  PRE-OPERATIVE DIAGNOSIS:  Lymphoma  POST-OPERATIVE DIAGNOSIS:  Lymphoma  PROCEDURE:  Procedure(s): RIGHT VIDEO ASSISTED THORACOSCOPY FOR PLEURAL BIOPSY (Right) INSERTION PLEURAL DRAINAGE CATHETER (Right)  SURGEON:  Surgeons and Role:    * Lightfoot, Eliezer Lofts, MD - Primary  PHYSICIAN ASSISTANT: Armani Gawlik PA-C  ANESTHESIA:   general  EBL:  10 mL   BLOOD ADMINISTERED:none  DRAINS:  PLEURX    LOCAL MEDICATIONS USED:  BUPIVICAINE  and  EXPAREL  SPECIMEN:  Source of Specimen:  PARIETAL PLEURA  DISPOSITION OF SPECIMEN:  PATHOLOGY  COUNTS:  YES  TOURNIQUET:  * No tourniquets in log *  DICTATION: .Dragon Dictation  PLAN OF CARE: Admit to inpatient   PATIENT DISPOSITION:  PACU - hemodynamically stable.   Delay start of Pharmacological VTE agent (>24hrs) due to surgical blood loss or risk of bleeding: no  COMPLICATIONS: NO KNOWN

## 2023-05-30 ENCOUNTER — Encounter (HOSPITAL_COMMUNITY): Payer: Self-pay | Admitting: Thoracic Surgery (Cardiothoracic Vascular Surgery)

## 2023-05-30 ENCOUNTER — Inpatient Hospital Stay (HOSPITAL_COMMUNITY): Payer: Medicare (Managed Care)

## 2023-05-30 LAB — CBC
HCT: 27.9 % — ABNORMAL LOW (ref 36.0–46.0)
Hemoglobin: 8.6 g/dL — ABNORMAL LOW (ref 12.0–15.0)
MCH: 29.4 pg (ref 26.0–34.0)
MCHC: 30.8 g/dL (ref 30.0–36.0)
MCV: 95.2 fL (ref 80.0–100.0)
Platelets: 181 10*3/uL (ref 150–400)
RBC: 2.93 MIL/uL — ABNORMAL LOW (ref 3.87–5.11)
RDW: 14.6 % (ref 11.5–15.5)
WBC: 10.4 10*3/uL (ref 4.0–10.5)
nRBC: 0 % (ref 0.0–0.2)

## 2023-05-30 LAB — BASIC METABOLIC PANEL
Anion gap: 4 — ABNORMAL LOW (ref 5–15)
BUN: 20 mg/dL (ref 8–23)
CO2: 29 mmol/L (ref 22–32)
Calcium: 8.6 mg/dL — ABNORMAL LOW (ref 8.9–10.3)
Chloride: 103 mmol/L (ref 98–111)
Creatinine, Ser: 0.88 mg/dL (ref 0.44–1.00)
GFR, Estimated: >60 mL/min (ref >60)
Glucose, Bld: 140 mg/dL — ABNORMAL HIGH (ref 70–99)
Potassium: 4.4 mmol/L (ref 3.5–5.1)
Sodium: 136 mmol/L (ref 135–145)

## 2023-05-30 NOTE — Progress Notes (Addendum)
1 Day Post-Op Procedure(s) (LRB): RIGHT VIDEO ASSISTED THORACOSCOPY FOR PLEURAL BIOPSY (Right) INSERTION PLEURAL DRAINAGE CATHETER (Right) Subjective: Some discomfort from tube  Objective: Vital signs in last 24 hours: Temp:  [97.5 F (36.4 C)-98 F (36.7 C)] 97.9 F (36.6 C) (12/23 1923) Pulse Rate:  [62-73] 66 (12/24 0026) Cardiac Rhythm: Normal sinus rhythm;Heart block (12/23 1900) Resp:  [10-20] 15 (12/24 0026) BP: (104-146)/(51-75) 105/53 (12/24 0026) SpO2:  [88 %-98 %] 94 % (12/24 0026) Weight:  [65.8 kg] 65.8 kg (12/23 0934)  Hemodynamic parameters for last 24 hours:    Intake/Output from previous day: 12/23 0701 - 12/24 0700 In: 740 [P.O.:240; I.V.:500] Out: 320 [Blood:20; Chest Tube:300] Intake/Output this shift: No intake/output data recorded.  General appearance: alert, cooperative, and no distress Heart: regular rate and rhythm Lungs: somewhat coarse throughout Abdomen: benign Extremities: no edema Wound: dressings intact, incis ok  Lab Results: Recent Labs    05/30/23 0506  WBC 10.4  HGB 8.6*  HCT 27.9*  PLT 181   BMET:  Recent Labs    05/30/23 0423  NA 136  K 4.4  CL 103  CO2 29  GLUCOSE 140*  BUN 20  CREATININE 0.88  CALCIUM 8.6*    PT/INR: No results for input(s): "LABPROT", "INR" in the last 72 hours. ABG No results found for: "PHART", "HCO3", "TCO2", "ACIDBASEDEF", "O2SAT" CBG (last 3)  Recent Labs    05/29/23 0941 05/29/23 1255  GLUCAP 120* 133*    Meds Scheduled Meds:  acetaminophen  1,000 mg Oral Q6H   Or   acetaminophen (TYLENOL) oral liquid 160 mg/5 mL  1,000 mg Oral Q6H   amiodarone  100 mg Oral Daily   apixaban  2.5 mg Oral BID   bisacodyl  10 mg Oral Daily   Chlorhexidine Gluconate Cloth  6 each Topical Daily   feeding supplement  237 mL Oral Daily   [START ON 05/31/2023] ferrous sulfate  325 mg Oral Once per day on Monday Wednesday Friday   furosemide  40 mg Oral Daily   ketorolac  15 mg Intravenous Q6H    levothyroxine  125 mcg Oral Q0600   melatonin  3 mg Oral QHS   mometasone-formoterol  2 puff Inhalation BID   pantoprazole  40 mg Oral Daily   polyethylene glycol  17 g Oral Daily   rOPINIRole  0.25 mg Oral QHS   senna-docusate  1 tablet Oral QHS   Continuous Infusions: PRN Meds:.ALPRAZolam, busPIRone, ipratropium-albuterol, ondansetron (ZOFRAN) IV, polyvinyl alcohol, traMADol, traZODone  Xrays DG Chest Port 1 View Result Date: 05/29/2023 CLINICAL DATA:  Pleural effusion. Right video-assisted thoracoscopy for pleural biopsy. EXAM: PORTABLE CHEST 1 VIEW COMPARISON:  05/26/2023 FINDINGS: Cardiac enlargement. Minimal residual right pleural effusion, improved since prior study. A right chest tube has been placed. No significant pneumothorax. A right central venous catheter has been placed with tip projecting over the lower SVC region. Postoperative changes in the left shoulder. A loop recorder is present. Calcification of the aorta. Degenerative changes in the spine. IMPRESSION: 1. Decreased right pleural effusion with right chest tube in place. No pneumothorax. 2. Cardiac enlargement. 3. Appliances appear in satisfactory position. Electronically Signed   By: Burman Nieves M.D.   On: 05/29/2023 17:15    Assessment/Plan: S/P Procedure(s) (LRB): RIGHT VIDEO ASSISTED THORACOSCOPY FOR PLEURAL BIOPSY (Right) INSERTION PLEURAL DRAINAGE CATHETER (Right) POD#1  1 afeb, s BP 100's-140's,mostly well controlled,  sinus rhythm 2 O2 sats good on 2 liters-has home O2, mult pulm meds 3 voiding  not measured 4 normal renal fxn 5 H/H dropped more than would be expected from procedure, she describes some chronic hemoptysis - not at transfusion threshold, monitor clinically- path pending, she is on Iron supplement, may need to consider whether safe to use apixiban- will d/w with MD, hold for now- lovenox stopped 6 CXR improved aeration, no pntx 7 CT 300 cc- attached to pleurx 8 will have pleurx teaching  done today with nursing and obtaining supplies- home later today   LOS: 1 day    [place]s Pager [phone] 05/30/2023  Agree with above Teaching today Home today  Tanayia Wahlquist Keane Scrape

## 2023-05-30 NOTE — Progress Notes (Signed)
PleurX Drain education completed with patient and family by [place] from [place]. No further questions from patient or family. Discharge paperwork and education provided by this nurse. No further questions from patient or family.

## 2023-05-30 NOTE — Plan of Care (Signed)
  Problem: Education: Goal: Knowledge of General Education information will improve Description: Including pain rating scale, medication(s)/side effects and non-pharmacologic comfort measures Outcome: Adequate for Discharge   Problem: Health Behavior/Discharge Planning: Goal: Ability to manage health-related needs will improve Outcome: Adequate for Discharge   Problem: Clinical Measurements: Goal: Ability to maintain clinical measurements within normal limits will improve Outcome: Adequate for Discharge Goal: Will remain free from infection Outcome: Adequate for Discharge Goal: Diagnostic test results will improve Outcome: Adequate for Discharge Goal: Respiratory complications will improve Outcome: Adequate for Discharge Goal: Cardiovascular complication will be avoided Outcome: Adequate for Discharge   Problem: Activity: Goal: Risk for activity intolerance will decrease 05/30/2023 1140 by Garth Bigness, RN Outcome: Adequate for Discharge 05/30/2023 [id number] by Garth Bigness, RN Outcome: Progressing   Problem: Nutrition: Goal: Adequate nutrition will be maintained 05/30/2023 1140 by Garth Bigness, RN Outcome: Adequate for Discharge 05/30/2023 0752 by Garth Bigness, RN Outcome: Progressing   Problem: Coping: Goal: Level of anxiety will decrease 05/30/2023 1140 by Garth Bigness, RN Outcome: Adequate for Discharge 05/30/2023 0752 by Garth Bigness, RN Outcome: Progressing   Problem: Elimination: Goal: Will not experience complications related to bowel motility 05/30/2023 1140 by Garth Bigness, RN Outcome: Adequate for Discharge 05/30/2023 0752 by Garth Bigness, RN Outcome: Progressing Goal: Will not experience complications related to urinary retention 05/30/2023 1140 by Garth Bigness, RN Outcome: Adequate for Discharge 05/30/2023 0752 by Garth Bigness, RN Outcome: Progressing   Problem: Pain Management: Goal: General experience  of comfort will improve 05/30/2023 1140 by Garth Bigness, RN Outcome: Adequate for Discharge 05/30/2023 0752 by Garth Bigness, RN Outcome: Progressing   Problem: Safety: Goal: Ability to remain free from injury will improve 05/30/2023 1140 by Garth Bigness, RN Outcome: Adequate for Discharge 05/30/2023 0752 by Garth Bigness, RN Outcome: Progressing   Problem: Skin Integrity: Goal: Risk for impaired skin integrity will decrease 05/30/2023 1140 by Garth Bigness, RN Outcome: Adequate for Discharge 05/30/2023 9528 by Garth Bigness, RN Outcome: Progressing   Problem: Education: Goal: Knowledge of disease or condition will improve Outcome: Adequate for Discharge Goal: Knowledge of the prescribed therapeutic regimen will improve Outcome: Adequate for Discharge   Problem: Activity: Goal: Risk for activity intolerance will decrease Outcome: Adequate for Discharge   Problem: Cardiac: Goal: Will achieve and/or maintain hemodynamic stability Outcome: Adequate for Discharge   Problem: Clinical Measurements: Goal: Postoperative complications will be avoided or minimized Outcome: Adequate for Discharge   Problem: Respiratory: Goal: Respiratory status will improve Outcome: Adequate for Discharge   Problem: Pain Management: Goal: Pain level will decrease Outcome: Adequate for Discharge   Problem: Skin Integrity: Goal: Wound healing without signs and symptoms infection will improve Outcome: Adequate for Discharge

## 2023-05-30 NOTE — Progress Notes (Signed)
Mobility Specialist Progress Note:   05/30/23 1100  Mobility  Activity Ambulated with assistance in hallway  Level of Assistance Contact guard assist, steadying assist  Assistive Device Front wheel walker  Distance Ambulated (ft) 340 ft  Activity Response Tolerated well  Mobility Referral Yes  Mobility visit 1 Mobility  Mobility Specialist Start Time (ACUTE ONLY) 0907  Mobility Specialist Stop Time (ACUTE ONLY) 0925  Mobility Specialist Time Calculation (min) (ACUTE ONLY) 18 min    Pre Mobility: 65 HR,  95% SpO2 Post Mobility:  68 HR,  97% SpO2  Pt received in bed, agreeable to mobility. Asymptomatic w/ no complaints throughout. Pt left in bed with call bell and all needs met.  D'Vante Earlene Plater Mobility Specialist Please contact via Special educational needs teacher or Rehab office at [phone]

## 2023-05-30 NOTE — Anesthesia Postprocedure Evaluation (Signed)
Anesthesia Post Note  Patient: Tonia Habecker  Procedure(s) Performed: RIGHT VIDEO ASSISTED THORACOSCOPY FOR PLEURAL BIOPSY (Right) INSERTION PLEURAL DRAINAGE CATHETER (Right)     Patient location during evaluation: PACU Anesthesia Type: General Level of consciousness: awake and alert Pain management: pain level controlled Vital Signs Assessment: post-procedure vital signs reviewed and stable Respiratory status: spontaneous breathing, nonlabored ventilation, respiratory function stable and patient connected to nasal cannula oxygen Cardiovascular status: blood pressure returned to baseline and stable Postop Assessment: no apparent nausea or vomiting Anesthetic complications: no   No notable events documented.  Last Vitals:  Vitals:   05/29/23 2000 05/30/23 0026  BP: 112/60 (!) 105/53  Pulse: 71 66  Resp: 16 15  Temp:    SpO2: 96% 94%    Last Pain:  Vitals:   05/30/23 0501  TempSrc:   PainSc: 1                  Carri Spillers S

## 2023-05-30 NOTE — TOC Initial Note (Addendum)
Transition of Care [facility]) - Initial/Assessment Note    Patient Details  Name: [name] MRN: [id number] Date of Birth: [birth year]  Transition of Care [facility]) CM/SW Contact:    Gala Lewandowsky, RN Phone Number: 05/30/2023, 12:56 PM  Clinical Narrative:  Patient plan for home with PleurX Drain. HH RN orders added for management. Patient does not have a preference for agency and referral submitted to [facility]. Office to call the patient with a visit time. Box of 10 PleurX drains ordered for the patient. Staff RN aware to educate the patient on the drain. Case Manager to send the paperwork to [facility] for additional PleurX Bottles.Family to transport home. No further home needs identified.                 12-24 [facility] Liaison called Case Manager stating that the insurance did not go through. [facility] will have to find an agency in network since the patient was accepted and has now left the hospital. Liaison is aware.   05-30-23 1548 Case Manager was able to call the provider at [facility] Dr Garey Ham @ [phone] and she will have the Staff RN Misty Stanley at [facility] to follow up with the patient's PleurX Drain to drain in the home. Clinicals and PleurX information faxed and scanned to the provider. Case Manager did call the son to make him aware of the plan of care. If the patient cannot be serviced with [facility] patient will need an LOG via [facility]. Case Manager did send a message to the unit Case Manager to follow up on Thursday.      Expected Discharge Plan: Home w Home Health Services Barriers to Discharge: No Barriers Identified   Patient Goals and CMS Choice Patient states their goals for this hospitalization and ongoing recovery are:: plan to return home with home health   Choice offered to / list presented to : Patient (No agency preference)      Expected Discharge Plan and Services In-house Referral: NA Discharge Planning Services: CM Consult Post  Acute Care Choice: Home Health Living arrangements for the past 2 months: Apartment Expected Discharge Date: 05/30/23               DME Arranged: Chest tube pluerex DME Agency: Other - Comment [facility])     Representative spoke with at DME Agency: faxed HH Arranged: RN HH Agency: [facility] ([facility]) Date HH Agency Contacted: 05/30/23 Time HH Agency Contacted: 1251 Representative spoke with at [facility] Agency: [facility] Dan  Prior Living Arrangements/Services Living arrangements for the past 2 months: Apartment Lives with:: Self (has family support) Patient language and need for interpreter reviewed:: Yes Do you feel safe going back to the place where you live?: Yes      Need for Family Participation in Patient Care: Yes (Comment) Care giver support system in place?: Yes (comment)   Criminal Activity/Legal Involvement Pertinent to Current Situation/Hospitalization: No - Comment as needed  Activities of Daily Living   ADL Screening (condition at time of admission) Independently performs ADLs?: Yes (appropriate for developmental age) Is the patient deaf or have difficulty hearing?: No Does the patient have difficulty seeing, even when wearing glasses/contacts?: No Does the patient have difficulty concentrating, remembering, or making decisions?: No  Permission Sought/Granted Permission sought to share information with : Family Supports, Case Production designer, theatre/television/film, Oceanographer granted to share information with : Yes, Verbal Permission Granted     Permission granted to share info w AGENCY: [facility]  Emotional Assessment         Alcohol / Substance Use: Not Applicable Psych Involvement: No (comment)  Admission diagnosis:  Pleural effusion [J90] Patient Active Problem List   Diagnosis Date Noted   Pleural effusion 05/29/2023   Lymphoma (HCC) 04/26/2023   Palliative care encounter 04/26/2023   Shortness of breath 04/24/2023   Community acquired  pneumonia 04/24/2023   Recurrent right pleural effusion 04/21/2023   Acute on chronic respiratory failure with hypoxia (HCC) 04/21/2023   GERD without esophagitis 04/21/2023   Paroxysmal atrial fibrillation (HCC) 04/21/2023   Pneumonia 04/21/2023   Anxiety 04/17/2023   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   Sacral fracture, closed (HCC) 04/15/2023   Fall 04/14/2023   Lumbar compression fracture (HCC) 04/14/2023   Unintentional weight loss 04/13/2023   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   Mood disorder (HCC) 04/13/2023   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) 04/12/2023   Pleural effusion on right 04/07/2023   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   Chronic kidney disease, stage 3a (HCC) 04/07/2023   Atrial fibrillation, chronic (HCC) 04/07/2023   DVT (deep venous thrombosis) (HCC) 04/07/2023   Leukocytosis 04/07/2023   Right foot pain 04/07/2023   Acute sinusitis 05/23/2022   COPD exacerbation (HCC) 05/22/2022   Atrial fibrillation with RVR (HCC) 05/22/2022   Chest pain 05/22/2022   HTN (hypertension) 05/22/2022   Diabetes mellitus without complication (HCC) 05/22/2022   COPD (chronic obstructive pulmonary disease) (HCC) 05/22/2022   Hypothyroidism 05/22/2022   Iron deficiency anemia 05/22/2022   Osteoarthritis of right knee 03/14/2022   PCP:  [facility] Pharmacy:   [facility] [place], [place] - [address] [place] [address] Phone: [phone] Fax: [phone]     Social Drivers of Health (SDOH) Social History: SDOH Screenings   Food Insecurity: No Food Insecurity (05/29/2023)  Housing: Unknown (05/29/2023)  Transportation Needs: No Transportation Needs (05/29/2023)  Utilities: Not At Risk (05/29/2023)  Depression (PHQ2-9): Low Risk  (04/13/2023)  Tobacco Use: Low Risk   (05/29/2023)   SDOH Interventions: Housing Interventions: Intervention Not Indicated   Readmission Risk Interventions    04/24/2023    3:33 PM 04/10/2023    1:51 PM  Readmission Risk Prevention Plan  Transportation Screening Complete Complete  PCP or Specialist Appt within 3-5 Days  Complete  Palliative Care Screening  Complete  Medication Review (RN Care Manager) Complete Complete  Skilled Nursing Facility Complete

## 2023-05-30 NOTE — Discharge Instructions (Signed)
Change dressing daily until follow-up appointment with Dr. Cliffton Asters.

## 2023-05-30 NOTE — Plan of Care (Signed)
  Problem: Activity: Goal: Risk for activity intolerance will decrease Outcome: Progressing   Problem: Nutrition: Goal: Adequate nutrition will be maintained Outcome: Progressing   Problem: Coping: Goal: Level of anxiety will decrease Outcome: Progressing   Problem: Elimination: Goal: Will not experience complications related to bowel motility Outcome: Progressing Goal: Will not experience complications related to urinary retention Outcome: Progressing   Problem: Pain Management: Goal: General experience of comfort will improve Outcome: Progressing   Problem: Safety: Goal: Ability to remain free from injury will improve Outcome: Progressing   Problem: Skin Integrity: Goal: Risk for impaired skin integrity will decrease Outcome: Progressing

## 2023-05-30 NOTE — Plan of Care (Signed)
  Problem: Education: Goal: Knowledge of General Education information will improve Description: Including pain rating scale, medication(s)/side effects and non-pharmacologic comfort measures Outcome: Progressing   Problem: Health Behavior/Discharge Planning: Goal: Ability to manage health-related needs will improve Outcome: Progressing   Problem: Clinical Measurements: Goal: Ability to maintain clinical measurements within normal limits will improve Outcome: Progressing Goal: Will remain free from infection Outcome: Progressing Goal: Diagnostic test results will improve Outcome: Progressing Goal: Respiratory complications will improve Outcome: Progressing Goal: Cardiovascular complication will be avoided Outcome: Progressing   Problem: Activity: Goal: Risk for activity intolerance will decrease Outcome: Progressing   Problem: Nutrition: Goal: Adequate nutrition will be maintained Outcome: Progressing   Problem: Coping: Goal: Level of anxiety will decrease Outcome: Progressing   Problem: Elimination: Goal: Will not experience complications related to bowel motility Outcome: Progressing Goal: Will not experience complications related to urinary retention Outcome: Progressing   Problem: Pain Management: Goal: General experience of comfort will improve Outcome: Progressing   Problem: Skin Integrity: Goal: Risk for impaired skin integrity will decrease Outcome: Progressing   Problem: Education: Goal: Knowledge of disease or condition will improve Outcome: Progressing Goal: Knowledge of the prescribed therapeutic regimen will improve Outcome: Progressing   Problem: Activity: Goal: Risk for activity intolerance will decrease Outcome: Progressing   Problem: Cardiac: Goal: Will achieve and/or maintain hemodynamic stability Outcome: Progressing   Problem: Clinical Measurements: Goal: Postoperative complications will be avoided or minimized Outcome: Progressing    Problem: Respiratory: Goal: Respiratory status will improve Outcome: Progressing   Problem: Pain Management: Goal: Pain level will decrease Outcome: Progressing   Problem: Skin Integrity: Goal: Wound healing without signs and symptoms infection will improve Outcome: Progressing

## 2023-06-01 LAB — SURGICAL PATHOLOGY

## 2023-06-01 NOTE — TOC Progression Note (Signed)
Transition of Care [facility]) - Progression Note    Patient Details  Name: [name] MRN: [id number] Date of Birth: [birth year]  Transition [facility]) CM/SW Contact  Harriet Masson, RN Phone Number: 06/01/2023, 9:02 AM  Clinical Narrative:      Spoke to Dr. Garey Ham [facility] regarding follow up for patient with PleurX drain. Dr. Garey Ham stated patient will follow up in the clinic today and if needed [facility] will contract with home health.  Expected Discharge Plan: Home w Home Health Services Barriers to Discharge: No Barriers Identified  Expected Discharge Plan and Services In-house Referral: NA Discharge Planning Services: CM Consult Post Acute Care Choice: Home Health Living arrangements for the past 2 months: Apartment Expected Discharge Date: 05/30/23               DME Arranged: Chest tube pluerex DME Agency: Other - Comment [facility])     Representative spoke with at DME Agency: faxed HH Arranged: RN HH Agency: [facility]) Date HH Agency Contacted: 05/30/23 Time HH Agency Contacted: 1251 Representative spoke with at [facility] Agency: Adele Dan   Social Determinants of Health (SDOH) Interventions SDOH Screenings   Food Insecurity: No Food Insecurity (05/29/2023)  Housing: Unknown (05/29/2023)  Transportation Needs: No Transportation Needs (05/29/2023)  Utilities: Not At Risk (05/29/2023)  Depression (PHQ2-9): Low Risk  (04/13/2023)  Tobacco Use: Low Risk  (05/29/2023)    Readmission Risk Interventions    04/24/2023    3:33 PM 04/10/2023    1:51 PM  Readmission Risk Prevention Plan  Transportation Screening Complete Complete  PCP or Specialist Appt within 3-5 Days  Complete  Palliative Care Screening  Complete  Medication Review (RN Care Manager) Complete Complete  Skilled Nursing Facility Complete

## 2023-06-08 ENCOUNTER — Inpatient Hospital Stay: Admit: 2023-06-08 | Discharge: 2023-06-14 | Payer: MEDICARE

## 2023-06-08 DIAGNOSIS — K5902 Outlet dysfunction constipation: Secondary | ICD-10-CM

## 2023-06-09 ENCOUNTER — Inpatient Hospital Stay: Payer: Medicare (Managed Care) | Attending: Oncology | Admitting: Oncology

## 2023-06-09 ENCOUNTER — Encounter (HOSPITAL_COMMUNITY): Payer: Self-pay | Admitting: Hematology

## 2023-06-09 ENCOUNTER — Encounter: Payer: Self-pay | Admitting: Oncology

## 2023-06-09 ENCOUNTER — Ambulatory Visit (INDEPENDENT_AMBULATORY_CARE_PROVIDER_SITE_OTHER): Payer: Self-pay | Admitting: Thoracic Surgery (Cardiothoracic Vascular Surgery)

## 2023-06-09 VITALS — BP 155/65 | HR 79 | Resp 18 | Wt 140.0 lb

## 2023-06-09 VITALS — BP 144/65 | HR 72 | Temp 98.1°F | Wt 141.3 lb

## 2023-06-09 DIAGNOSIS — R634 Abnormal weight loss: Secondary | ICD-10-CM | POA: Diagnosis not present

## 2023-06-09 DIAGNOSIS — J948 Other specified pleural conditions: Secondary | ICD-10-CM | POA: Diagnosis present

## 2023-06-09 DIAGNOSIS — E1122 Type 2 diabetes mellitus with diabetic chronic kidney disease: Secondary | ICD-10-CM | POA: Diagnosis not present

## 2023-06-09 DIAGNOSIS — F39 Unspecified mood [affective] disorder: Secondary | ICD-10-CM

## 2023-06-09 DIAGNOSIS — C8510 Unspecified B-cell lymphoma, unspecified site: Secondary | ICD-10-CM | POA: Diagnosis not present

## 2023-06-09 DIAGNOSIS — Z9889 Other specified postprocedural states: Secondary | ICD-10-CM

## 2023-06-09 DIAGNOSIS — K7460 Unspecified cirrhosis of liver: Secondary | ICD-10-CM | POA: Diagnosis not present

## 2023-06-09 DIAGNOSIS — N183 Chronic kidney disease, stage 3 unspecified: Secondary | ICD-10-CM | POA: Diagnosis not present

## 2023-06-09 DIAGNOSIS — J90 Pleural effusion, not elsewhere classified: Secondary | ICD-10-CM

## 2023-06-09 NOTE — Assessment & Plan Note (Signed)
 Refer to GI

## 2023-06-09 NOTE — Assessment & Plan Note (Signed)
 Encourage oral hydration and avoid nephrotoxins.

## 2023-06-09 NOTE — Progress Notes (Signed)
      [address]       [place] [address]             [phone]        Anett [place]  [facility] Medical Record #[id number] Date of Birth: 1945/06/16  Referring: [facility]* Primary Care: [facility] Primary Cardiologist:None  Reason for visit:   follow-up  History of Present Illness:     78yo famale presents for their first follow-up appointment.  Overall, she is doing well.  She has had minimal output from her Pleurx catheter.  Physical Exam: BP (!) 155/65   Pulse 79   Resp 18   Wt 140 lb (63.5 kg)   SpO2 91% Comment: RA  BMI 27.34 kg/m   Alert NAD Incision clean.   Abdomen, ND no peripheral edema   Diagnostic Studies & Laboratory data:  Path:  FINAL MICROSCOPIC DIAGNOSIS:  A. PLEURA, BIOPSY #1: -  Pleura with prominent atypical lymphoid infiltrate, see note.  B. PLEURA, BIOPSY#2: -  Pleural with prominent atypical lymphoid infiltrate, see note  Note: The fragments of pleura having underlying population of predominantly small lymphocytes but also percolate into adjacent adipose tissue.  This lymphoid infiltrate is a mix of both B and T cells; however, the B cells do predominate and are the prominent cell that percolates into the fat.  These B cells are CD20/PAX5 positive and are essentially negative for both CD5 and CD10.  CD10/BCL6 do highlight a few small irregular germinal centers that are appropriately negative for Bcl-2 CD43 is appropriately coexpressed on the T cells and not on the B cells.  Cyclin D1 is negative.  The proliferation rate is appropriately high in the germinal centers but is low less than 5% elsewhere.  Flow cytometry was performed and identified polytypic B cells and no immunophenotypic abnormality in the T cells.  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  Molecular testing for B-cell clonality is pending.  Also clinically indicated the specimen could be sent for a NexGen lymphoid panel.  Clinical correlation recommended.     Assessment / Plan:   78yo female s/p pleural biopsy and pleurx catheter placement.  I will touch base with her in 1 month.  She can continue drainage weekly.  Once treatment has been determined, we will assess the need to the pleurx catheter removal.     Linnie MALVA Rayas 06/09/2023 4:27 PM

## 2023-06-09 NOTE — Assessment & Plan Note (Addendum)
 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 Bone marrow negative for lymphoma involvement.  Peripheral blood flow cytometry is negative. 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 B-cell gene arrangement is negative. Given the difficulty in making a definitive lymphoma diagnosis, I recommend patient to seek a second opinion at [facility]  Status post pleural catheter, continue drain as needed.

## 2023-06-09 NOTE — Progress Notes (Signed)
 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 Bone marrow negative for lymphoma involvement.  Peripheral blood flow cytometry is negative. 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 B-cell gene arrangement is negative. Given the difficulty in making a definitive lymphoma diagnosis, I recommend patient to seek a second opinion at [facility]  Status post pleural catheter, continue drain as needed.    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    Mood disorder (HCC) History of anxiety/depression. Recommend patient continue follow-up with [facility]  Cirrhosis of liver without ascites (HCC) Refer to GI   Orders Placed This Encounter  Procedures   Ambulatory referral to Hematology / Oncology    Referral Priority:   Routine    Referral Type:   Consultation    Referral Reason:   Specialty Services Required    Requested Specialty:   Oncology    Number of Visits Requested:   1   Follow-up to be determined. All questions were answered. The patient knows to call the clinic with any problems, questions or concerns.  Zelphia Cap, MD, PhD [facility] Hematology Oncology 06/09/2023    HISTORY OF  PRESENTING ILLNESS:  [name]  [place]add[place] 78 y.o. female presents to establish care for pleural effusion, unintentional weight loss.   She was recently hospitalized due to dyspnea and chest tightness.  04/07/2023  CT scan revealed significant pleural effusion, which was subsequently drained. Flow cytometry of the fluid indicated potential lymphoma cells. The patient has been experiencing inconsistent breathing, with noticeable shortness of breath upon exertion, a significant change from her previous ability to walk for a mile multiple times a week. These symptoms began approximately two weeks prior to the consultation.  The patient's appetite has been notably decreased, with consumption of only one substantial meal per day. This has resulted in a weight loss of over twenty pounds in the past few months. The patient reports some sweating, not on a daily basis, for the past year.  The patient also reports a history of shoulder injury due to a fall down a flight of stairs in the 1990s, which required multiple surgeries. She currently manages the pain with Tramadol . The patient lives alone and has been experiencing difficulty with mobility due to her current health condition.   INTERVAL HISTORY [name]  [name] is a 78 y.o. female who has above history reviewed by me today presents for follow up visit for recurrent pleural effusion, lymphoma cells in fluid study, unintentional weight loss.  Status post pleural biopsy and pleural catheter placement. [facility] drains pleural catheter as needed.  She reports breathing has improved.  Pleura, flow cytometry:  -  No immunophenotypic evidence of a lymphoproliferative sorter (i.e. no  monoclonal B cells or immunophenotypically abnormal T cells detected.   A. PLEURA, BIOPSY #1: -  Pleura with prominent atypical lymphoid infiltrate, see note.  B. PLEURA, BIOPSY#2: -  Pleural with prominent atypical lymphoid infiltrate, see note  Note: The fragments  of pleura having underlying population of predominantly small lymphocytes but also percolate into adjacent adipose tissue.  This lymphoid infiltrate is a mix of both B and T cells; however, the B cells do predominate and are the prominent cell that percolates into the fat.  These B cells are CD20/PAX5 positive and are essentially negative for both CD5 and CD10.  CD10/BCL6 do highlight a few small irregular germinal centers that are appropriately negative for Bcl-2 CD43 is appropriately coexpressed on the T cells and not on the B cells.  Cyclin D1 is negative.  The proliferation rate is appropriately high in the germinal centers but is low less than 5% elsewhere.  Flow cytometry was performed and identified polytypic B cells and no immunophenotypic abnormality in the T cells.  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  Molecular testing for B-cell clonality is pending.  Also clinically indicated the specimen could be sent for a NexGen lymphoid panel.  Clinical correlation recommended.     MEDICAL HISTORY:  Past Medical History:  Diagnosis Date   A-fib (HCC)    Anemia    Anxiety    Arthritis    Asthma    Cirrhosis (HCC)    COPD (chronic obstructive pulmonary disease) (HCC)    Diabetes mellitus without complication (HCC)    type 2   DVT (deep venous thrombosis) (HCC) 1980   GERD (gastroesophageal reflux disease)    History of hiatal hernia    Hypertension    Hypothyroidism    Lymphoma (HCC)     SURGICAL HISTORY: Past Surgical History:  Procedure Laterality Date   CATARACT EXTRACTION Bilateral    CHEST TUBE INSERTION Right  05/29/2023   Procedure: INSERTION PLEURAL DRAINAGE CATHETER;  Surgeon: Shyrl Linnie KIDD, MD;  Location: [facility] OR;  Service: Thoracic;  Laterality: Right;   CHOLECYSTECTOMY     COLONOSCOPY     ESOPHAGOGASTRODUODENOSCOPY (EGD) WITH PROPOFOL  N/A 01/17/2022   Procedure: ESOPHAGOGASTRODUODENOSCOPY (EGD) WITH PROPOFOL ;  Surgeon: Onita Elspeth Sharper, DO;  Location: [facility] ENDOSCOPY;  Service: Gastroenterology;  Laterality: N/A;   HUMERUS SURGERY Left    fracture (7 surgeries total)   IR BONE MARROW BIOPSY & ASPIRATION  04/26/2023   LOOP RECORDER INSERTION  June 03, 2017   pt states battery died, not transmitting, just there   MENISCUS REPAIR Right 2018/06/03   THYROIDECTOMY     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; reconstruction from hip bone   TONSILECTOMY, ADENOIDECTOMY, BILATERAL MYRINGOTOMY AND TUBES     TOTAL KNEE ARTHROPLASTY Right 03/14/2022   Procedure: TOTAL KNEE ARTHROPLASTY;  Surgeon: Lorelle Hussar, MD;  Location: [facility] ORS;  Service: Orthopedics;  Laterality: Right;   TOTAL SHOULDER REPLACEMENT Left    TUBAL LIGATION     VIDEO ASSISTED THORACOSCOPY Right 05/29/2023   Procedure: RIGHT VIDEO ASSISTED THORACOSCOPY FOR PLEURAL BIOPSY;  Surgeon: Shyrl Linnie KIDD, MD;  Location: [facility] OR;  Service: Thoracic;  Laterality: Right;   WRIST SURGERY Left    multiple fractures from falls (14 surgeries from wrist to shoulder)    SOCIAL HISTORY: Social History   Socioeconomic History   Marital  status: Widowed    Spouse name: Not on file   Number of children: 6   Years of education: Not on file   Highest education level: Not on file  Occupational History   Not on file  Tobacco Use   Smoking status: Never    Passive exposure: Past   Smokeless tobacco: Never  Vaping Use   Vaping status: Never Used  Substance and Sexual Activity   Alcohol  use: Never   Drug use: Never   Sexual activity: Not Currently  Other Topics Concern   Not on file  Social History Narrative   Lives in senior housing in  [place] (apt[place]   Social Drivers of Health   Financial Resource Strain: Not on file  Food Insecurity: No Food Insecurity (05/29/2023)   Hunger Vital Sign    Worried About Running Out of Food in the Last Year: Never true    Ran Out of Food in the Last Year: Never true  Transportation Needs: No Transportation Needs (05/29/2023)   PRAPARE - Administrator, Civil Service (Medical): No    Lack of Transportation (Non-Medical): No  Physical Activity: Not on file  Stress: Not on file  Social Connections: Not on file  Intimate Partner Violence: Not At Risk (05/29/2023)   Humiliation, Afraid, Rape, and Kick questionnaire    Fear of Current or Ex-Partner: No    Emotionally Abused: No    Physically Abused: No    Sexually Abused: No    FAMILY HISTORY: Family History  Problem Relation Age of Onset   Kidney disease Mother    Tuberculosis Father     ALLERGIES:  is allergic to cephalosporins, codeine, contrast media [iodinated contrast media], and oxycodone.  MEDICATIONS:  Current Outpatient Medications  Medication Sig Dispense Refill   acetaminophen  (TYLENOL ) 325 MG tablet Take 650 mg by mouth every 6 (six) hours as needed for moderate pain (pain score 4-6).     acetaminophen  (TYLENOL ) 650 MG CR tablet Take 1,300 mg by mouth in the morning and at bedtime.     ADVAIR HFA 115-21 MCG/ACT inhaler Inhale 2 puffs into the lungs 2 (two) times daily.     albuterol  (VENTOLIN  HFA) 108 (90 Base) MCG/ACT inhaler Inhale 2-4 puffs by mouth every 4 hours as needed for wheezing, cough, and/or shortness of breath (Patient taking differently: Inhale 2 puffs into the lungs every 4 (four) hours as needed for wheezing or shortness of breath.) 6.7 g 0   ALPRAZolam  (XANAX ) 0.25 MG tablet Take 1 tablet (0.25 mg total) by mouth 2 (two) times daily as needed for anxiety. (Patient taking differently: Take 0.25 mg by mouth as needed for anxiety.) 20 tablet 0   amiodarone  (PACERONE ) 100 MG tablet Take 1  tablet (100 mg total) by mouth daily. 30 tablet 0   Ascorbic Acid  (VITAMIN C  CR) 1500 MG TBCR Take 1 tablet by mouth daily.     bismuth  subsalicylate (PEPTO BISMOL) 262 MG/15ML suspension Take 30 mLs by mouth every 6 (six) hours as needed for indigestion or diarrhea or loose stools.     busPIRone  (BUSPAR ) 5 MG tablet Take 5 mg by mouth 3 (three) times daily as needed (anxiety).     Carboxymeth-Glycerin-Polysorb (REFRESH OPTIVE ADVANCED) 0.5-1-0.5 % SOLN Apply 2 drops to eye 4 (four) times daily as needed (allergies).     escitalopram  (LEXAPRO ) 10 MG tablet Take 1 tablet (10 mg total) by mouth daily.     esomeprazole (NEXIUM) 40 MG capsule Take 40 mg  by mouth 2 (two) times daily.     feeding supplement (ENSURE ENLIVE / ENSURE PLUS) LIQD Take 237 mLs by mouth 3 (three) times daily between meals. (Patient taking differently: Take 237 mLs by mouth daily.)     ferrous sulfate  325 (65 FE) MG tablet Take 325 mg by mouth 3 (three) times a week. Monday, Wednesday, Friday     furosemide  (LASIX ) 40 MG tablet Take 40 mg by mouth daily.     ipratropium-albuterol  (DUONEB) 0.5-2.5 (3) MG/3ML SOLN Take 3 mLs by nebulization every 4 (four) hours as needed (wheezing/SOB).     levothyroxine  (SYNTHROID ) 125 MCG tablet Take 125 mcg by mouth daily before breakfast.     Magnesium  250 MG TABS Take 1 tablet by mouth daily.     Multiple Vitamin (MULTIVITAMIN WITH MINERALS) TABS tablet Take 1 tablet by mouth daily.     nitroGLYCERIN  (NITROSTAT ) 0.4 MG SL tablet Place 0.4 mg under the tongue every 5 (five) minutes as needed for chest pain.     Omega-3 Fatty Acids (FISH OIL PO) Take 1 capsule by mouth daily.     OVER THE COUNTER MEDICATION Take 465 mg by mouth daily. Black seed cumin echinacea     OXYGEN Inhale 2 L into the lungs at bedtime.     PHILLIPS MILK OF MAGNESIA 400 MG/5ML suspension Take 30 mLs by mouth daily as needed for indigestion.     polyethylene glycol (MIRALAX  / GLYCOLAX ) 17 g packet Take 17 g by mouth  daily.     potassium chloride  (KLOR-CON ) 10 MEQ tablet Take 10 mEq by mouth daily.     potassium chloride  (KLOR-CON ) 8 MEQ tablet Take 8 mEq by mouth at bedtime.     rOPINIRole  (REQUIP ) 0.25 MG tablet Take 0.25 mg by mouth at bedtime.     senna (SENOKOT) 8.6 MG TABS tablet Take 2 tablets by mouth in the morning and at bedtime.     sodium chloride  (OCEAN) 0.65 % SOLN nasal spray Place 1 spray into both nostrils as needed for congestion.  0   traZODone  (DESYREL ) 100 MG tablet Take 100 mg by mouth at bedtime.     TURMERIC PO Take 1,900 mg by mouth daily.     VITAMIN D -VITAMIN K  PO Take 1 capsule by mouth at bedtime.     No current facility-administered medications for this visit.    Review of Systems  Constitutional:  Positive for unexpected weight change. Negative for appetite change, chills, fatigue and fever.  HENT:   Negative for hearing loss and voice change.   Eyes:  Negative for eye problems.  Respiratory:  Positive for shortness of breath. Negative for chest tightness and cough.   Cardiovascular:  Negative for chest pain.  Gastrointestinal:  Negative for abdominal distention, abdominal pain and blood in stool.  Endocrine: Negative for hot flashes.  Genitourinary:  Negative for difficulty urinating and frequency.   Musculoskeletal:  Negative for arthralgias.  Skin:  Negative for itching and rash.  Neurological:  Negative for extremity weakness.  Hematological:  Negative for adenopathy.  Psychiatric/Behavioral:  Positive for depression. Negative for confusion. The patient is nervous/anxious.      PHYSICAL EXAMINATION: ECOG PERFORMANCE STATUS: 1 - Symptomatic but completely ambulatory  Vitals:   06/09/23 1146  BP: (!) 144/65  Pulse: 72  Temp: 98.1 F (36.7 C)  SpO2: 97%   Filed Weights   06/09/23 1146  Weight: 141 lb 4.8 oz (64.1 kg)    Physical Exam Constitutional:  General: She is not in acute distress. HENT:     Head: Normocephalic and atraumatic.  Eyes:      General: No scleral icterus. Cardiovascular:     Rate and Rhythm: Normal rate and regular rhythm.  Pulmonary:     Effort: Pulmonary effort is normal. No respiratory distress.     Comments: Decreased breath sound right lower lobe Abdominal:     General: There is no distension.     Palpations: Abdomen is soft.     Tenderness: There is no abdominal tenderness.  Musculoskeletal:        General: Normal range of motion.     Cervical back: Normal range of motion and neck supple.  Skin:    General: Skin is dry.     Findings: No erythema.  Neurological:     Mental Status: She is alert and oriented to person, place, and time. Mental status is at baseline.     Cranial Nerves: No cranial nerve deficit.     Motor: No abnormal muscle tone.  Psychiatric:        Mood and Affect: Affect normal.      LABORATORY DATA:  I have reviewed the data as listed    Latest Ref Rng & Units 05/30/2023    5:06 AM 05/26/2023   11:30 AM 04/29/2023    1:49 PM  CBC  WBC 4.0 - 10.5 K/uL 10.4  8.1  11.0   Hemoglobin 12.0 - 15.0 g/dL 8.6  88.3  87.7   Hematocrit 36.0 - 46.0 % 27.9  38.0  37.8   Platelets 150 - 400 K/uL 181  189  284       Latest Ref Rng & Units 05/30/2023    4:23 AM 05/26/2023   11:30 AM 04/29/2023    1:49 PM  CMP  Glucose 70 - 99 mg/dL 859  95  896   BUN 8 - 23 mg/dL 20  13  41   Creatinine 0.44 - 1.00 mg/dL 9.11  9.37  9.10   Sodium 135 - 145 mmol/L 136  137  135   Potassium 3.5 - 5.1 mmol/L 4.4  3.9  3.6   Chloride 98 - 111 mmol/L 103  106  93   CO2 22 - 32 mmol/L 29  21  31    Calcium  8.9 - 10.3 mg/dL 8.6  8.8  8.8   Total Protein 6.5 - 8.1 g/dL  7.3  7.1   Total Bilirubin <1.2 mg/dL  0.9  0.7   Alkaline Phos 38 - 126 U/L  46  42   AST 15 - 41 U/L  22  29   ALT 0 - 44 U/L  13  22      RADIOGRAPHIC STUDIES: I have personally reviewed the radiological images as listed and agreed with the findings in the report. DG Chest Port 1 View Result Date: 05/30/2023 CLINICAL DATA:   Pleural effusion. EXAM: PORTABLE CHEST 1 VIEW COMPARISON:  May 29, 2023. FINDINGS: Stable cardiomegaly with mild central pulmonary vascular congestion. Status post left shoulder arthroplasty. Old left rib fractures are noted. Right internal jugular catheter is unchanged. Right perihilar atelectasis or infiltrate is noted. IMPRESSION: Stable cardiomegaly with mild central pulmonary vascular congestion. Right perihilar atelectasis or infiltrate is noted Electronically Signed   By: Lynwood Landy Raddle M.D.   On: 05/30/2023 10:08   DG Chest 2 View Result Date: 05/29/2023 CLINICAL DATA:  Preoperative evaluation. History of lymphoma. Right pleural effusion. EXAM: CHEST - 2 VIEW COMPARISON:  05/23/2023 FINDINGS: Small right pleural effusion with basilar atelectasis or infiltration. Left lung is clear. No pneumothorax. Mediastinal contours appear intact. Normal heart size. Calcification of the aorta and mitral valve annulus. Postoperative changes in the left shoulder and right upper quadrant. Calcified granuloma in the right upper lung. IMPRESSION: Small right pleural effusion with basilar atelectasis similar to prior study. Electronically Signed   By: Elsie Gravely M.D.   On: 05/29/2023 17:17   DG Chest 2 View Result Date: 05/29/2023 CLINICAL DATA:  Pleural effusion.  Recent thoracentesis in November. EXAM: CHEST - 2 VIEW COMPARISON:  05/03/2023 FINDINGS: Small right pleural effusion is increasing since the previous study. Basilar atelectasis. Left lung is clear. Heart size is normal. Calcification of the mitral valve annulus and aorta. A loop recorder is present. No pneumothorax. Postoperative changes in the left shoulder. IMPRESSION: 1. Small but increasing right pleural effusion with basilar atelectasis. Electronically Signed   By: Elsie Gravely M.D.   On: 05/29/2023 17:16   DG Chest Port 1 View Result Date: 05/29/2023 CLINICAL DATA:  Pleural effusion. Right video-assisted thoracoscopy for pleural  biopsy. EXAM: PORTABLE CHEST 1 VIEW COMPARISON:  05/26/2023 FINDINGS: Cardiac enlargement. Minimal residual right pleural effusion, improved since prior study. A right chest tube has been placed. No significant pneumothorax. A right central venous catheter has been placed with tip projecting over the lower SVC region. Postoperative changes in the left shoulder. A loop recorder is present. Calcification of the aorta. Degenerative changes in the spine. IMPRESSION: 1. Decreased right pleural effusion with right chest tube in place. No pneumothorax. 2. Cardiac enlargement. 3. Appliances appear in satisfactory position. Electronically Signed   By: Elsie Gravely M.D.   On: 05/29/2023 17:15

## 2023-06-09 NOTE — Assessment & Plan Note (Signed)
 History of anxiety/depression. Recommend patient continue follow-up with [facility]

## 2023-06-14 ENCOUNTER — Telehealth: Payer: Self-pay | Admitting: *Deleted

## 2023-06-14 ENCOUNTER — Telehealth: Payer: Self-pay

## 2023-06-14 NOTE — Telephone Encounter (Signed)
 Sonny, provider with [facility], contacted the office stating patient's pleurx catheter is causing a great deal of discomfort. States she is in constant pain from catheter. States she is draining weekly and last draining yielded less than 1 tsp of fluid. Per Sonny, she can take out drain this week if Dr. Shyrl is agreeable. Per Dr. Shyrl, advised provider that he typically waits until chemo initiation prior to pulling of tube but will leave early removal up to [facility]. Sonny is to see patient tomorrow in clinic and will discuss removing or keeping tube with patient at the time.

## 2023-06-14 NOTE — Telephone Encounter (Signed)
 Referral faxed to [facility] for :   Second opinion low grade lymphoma   Fax confirmation received Fax: [phone]

## 2023-06-15 ENCOUNTER — Encounter: Payer: Self-pay | Admitting: Internal Medicine

## 2023-06-15 ENCOUNTER — Ambulatory Visit (INDEPENDENT_AMBULATORY_CARE_PROVIDER_SITE_OTHER): Payer: Medicare (Managed Care)

## 2023-06-15 ENCOUNTER — Ambulatory Visit (INDEPENDENT_AMBULATORY_CARE_PROVIDER_SITE_OTHER): Payer: Medicare (Managed Care) | Admitting: Internal Medicine

## 2023-06-15 VITALS — BP 140/70 | HR 71 | Temp 98.2°F | Ht 60.0 in | Wt 142.4 lb

## 2023-06-15 DIAGNOSIS — G4734 Idiopathic sleep related nonobstructive alveolar hypoventilation: Secondary | ICD-10-CM | POA: Diagnosis not present

## 2023-06-15 DIAGNOSIS — J90 Pleural effusion, not elsewhere classified: Secondary | ICD-10-CM | POA: Diagnosis not present

## 2023-06-15 MED ORDER — TRAMADOL HCL 50 MG PO TABS: ORAL_TABLET | ORAL | 0 refills | Status: DC

## 2023-06-15 NOTE — Patient Instructions (Signed)
 Please remember to go to the  x-ray department  for your tests - we will call you with the results when they are available    I refilled the tramadol today to take up to 1 every 4 hours as needed

## 2023-06-15 NOTE — Assessment & Plan Note (Addendum)
 As of 06/15/2023  using 2lpm hs and prn daytime   Advised daytime  Make sure you check your oxygen saturation  AT  your highest level of activity (not after you stop)   to be sure it stays over 90% and adjust  02 flow upward to maintain this level if needed    As she is very sedentary during the day her 02 demainds are low enough for now to maintain sats > 90% at rest RA       Each maintenance medication was reviewed in detail including emphasizing most importantly the difference between maintenance and prns and under what circumstances the prns are to be triggered using an action plan format where appropriate.  Total time for H and P, chart review, counseling, reviewing neb/02/pulse ox  device(s) and generating customized AVS unique to this office visit / same day charting  > 40 min with pt new to me         .

## 2023-06-15 NOTE — Progress Notes (Signed)
 Jimeka Virginia  Touch, female    DOB: [birth year]   MRN: [id number]   Brief patient profile:  39 yowf never smoker  referred to pulmonary clinic 06/15/2023   for ? removal of R pleurex    - the drainage has tapered off and having pain when it is pulled but no sob.    See last pulmonary Note 05/23/23 for summary of interventions     History of Present Illness  06/15/2023  Pulmonary/ 1st office eval/Zyana Amaro  Chief Complaint  Patient presents with   Acute Visit    Would like catheter taken out today.  Painful  Dyspnea:  ok at rest  Cough: was blood clots tapering off mostly in am  Sleep: 45 degrees is most comfortable  SABA use: minimal  02 use:2lpm hs - not needing during the day     No obvious day to day or daytime pattern/variability or assoc excess/ purulent sputum or mucus plugs or  subjective wheeze or overt sinus or hb symptoms.    Also denies any obvious fluctuation of symptoms with weather or environmental changes or other aggravating or alleviating factors except as outlined above   No unusual exposure hx or h/o childhood pna/ asthma or knowledge of premature birth.  Current Allergies, Complete Past Medical History, Past Surgical History, Family History, and Social History were reviewed in [employer] record.  ROS  The following are not active complaints unless bolded Hoarseness, sore throat, dysphagia, dental problems, itching, sneezing,  nasal congestion or discharge of excess mucus or purulent secretions, ear ache,   fever, chills, sweats, unintended wt loss or wt gain, classically   exertional cp,  orthopnea pnd or arm/hand swelling  or leg swelling, presyncope, palpitations, abdominal pain, anorexia, nausea, vomiting, diarrhea  or change in bowel habits or change in bladder habits, change in stools or change in urine, dysuria, hematuria,  rash, arthralgias, visual complaints, headache, numbness, weakness or ataxia or problems with walking or coordination,   change in mood or  memory.             Outpatient Medications Prior to Visit  Medication Sig Dispense Refill   acetaminophen  (TYLENOL ) 325 MG tablet Take 650 mg by mouth every 6 (six) hours as needed for moderate pain (pain score 4-6).     acetaminophen  (TYLENOL ) 650 MG CR tablet Take 1,300 mg by mouth in the morning and at bedtime.     ADVAIR HFA 115-21 MCG/ACT inhaler Inhale 2 puffs into the lungs 2 (two) times daily.     albuterol  (VENTOLIN  HFA) 108 (90 Base) MCG/ACT inhaler Inhale 2-4 puffs by mouth every 4 hours as needed for wheezing, cough, and/or shortness of breath (Patient taking differently: Inhale 2 puffs into the lungs every 4 (four) hours as needed for wheezing or shortness of breath.) 6.7 g 0   ALPRAZolam  (XANAX ) 0.25 MG tablet Take 1 tablet (0.25 mg total) by mouth 2 (two) times daily as needed for anxiety. (Patient taking differently: Take 0.25 mg by mouth as needed for anxiety.) 20 tablet 0   amiodarone  (PACERONE ) 100 MG tablet Take 1 tablet (100 mg total) by mouth daily. 30 tablet 0   Ascorbic Acid  (VITAMIN C  CR) 1500 MG TBCR Take 1 tablet by mouth daily.     BISACODYL  5 MG EC tablet Take 5 mg by mouth daily as needed.     bismuth  subsalicylate (PEPTO BISMOL) 262 MG/15ML suspension Take 30 mLs by mouth every 6 (six) hours as needed for indigestion  or diarrhea or loose stools.     busPIRone  (BUSPAR ) 5 MG tablet Take 5 mg by mouth 3 (three) times daily as needed (anxiety).     Carboxymeth-Glycerin-Polysorb (REFRESH OPTIVE ADVANCED) 0.5-1-0.5 % SOLN Apply 2 drops to eye 4 (four) times daily as needed (allergies).     escitalopram  (LEXAPRO ) 10 MG tablet Take 1 tablet (10 mg total) by mouth daily.     esomeprazole (NEXIUM) 40 MG capsule Take 40 mg by mouth 2 (two) times daily.     feeding supplement (ENSURE ENLIVE / ENSURE PLUS) LIQD Take 237 mLs by mouth 3 (three) times daily between meals. (Patient taking differently: Take 237 mLs by mouth daily.)     ferrous sulfate  325 (65  FE) MG tablet Take 325 mg by mouth 3 (three) times a week. Monday, Wednesday, Friday     furosemide  (LASIX ) 40 MG tablet Take 40 mg by mouth daily.     ipratropium-albuterol  (DUONEB) 0.5-2.5 (3) MG/3ML SOLN Take 3 mLs by nebulization every 4 (four) hours as needed (wheezing/SOB).     levothyroxine  (SYNTHROID ) 125 MCG tablet Take 125 mcg by mouth daily before breakfast.     Magnesium  250 MG TABS Take 1 tablet by mouth daily.     Multiple Vitamin (MULTIVITAMIN WITH MINERALS) TABS tablet Take 1 tablet by mouth daily.     nitroGLYCERIN  (NITROSTAT ) 0.4 MG SL tablet Place 0.4 mg under the tongue every 5 (five) minutes as needed for chest pain.     Omega-3 Fatty Acids (FISH OIL PO) Take 1 capsule by mouth daily.     OVER THE COUNTER MEDICATION Take 465 mg by mouth daily. Black seed cumin echinacea     OXYGEN Inhale 2 L into the lungs at bedtime.     PHILLIPS MILK OF MAGNESIA 400 MG/5ML suspension Take 30 mLs by mouth daily as needed for indigestion.     polyethylene glycol (MIRALAX  / GLYCOLAX ) 17 g packet Take 17 g by mouth daily.     potassium chloride  (KLOR-CON ) 10 MEQ tablet Take 10 mEq by mouth daily.     potassium chloride  (KLOR-CON ) 8 MEQ tablet Take 8 mEq by mouth at bedtime.     rOPINIRole  (REQUIP ) 0.25 MG tablet Take 0.25 mg by mouth at bedtime.     senna (SENOKOT) 8.6 MG TABS tablet Take 2 tablets by mouth in the morning and at bedtime.     sodium chloride  (OCEAN) 0.65 % SOLN nasal spray Place 1 spray into both nostrils as needed for congestion.  0   SYSTANE ULTRA 0.4-0.3 % SOLN Apply to eye.     traMADol  (ULTRAM ) 50 MG tablet Take 50 mg by mouth daily as needed.     traZODone  (DESYREL ) 100 MG tablet Take 100 mg by mouth at bedtime.     TURMERIC PO Take 1,900 mg by mouth daily.     VITAMIN D -VITAMIN K  PO Take 1 capsule by mouth at bedtime.     No facility-administered medications prior to visit.    Past Medical History:  Diagnosis Date   A-fib (HCC)    Anemia    Anxiety    Arthritis     Asthma    Cirrhosis (HCC)    COPD (chronic obstructive pulmonary disease) (HCC)    Diabetes mellitus without complication (HCC)    type 2   DVT (deep venous thrombosis) (HCC) 1980   GERD (gastroesophageal reflux disease)    History of hiatal hernia    Hypertension    Hypothyroidism  Lymphoma (HCC)       Objective:     BP (!) 140/70 (BP Location: Right Arm, Patient Position: Sitting, Cuff Size: Normal)   Pulse 71   Temp 98.2 F (36.8 C) (Oral)   Ht 5' (1.524 m)   Wt 142 lb 6.4 oz (64.6 kg)   SpO2 97%   BMI 27.81 kg/m   SpO2: 97 % RA  Somber w/c bound wf nad    HEENT : Oropharynx  clear     NECK :  without  apparent JVD/ palpable Nodes/TM    LUNGS: no acc muscle use,  slt kyphotic  contour chest with minimal decreased bs and dullness just at the R base and pleurex dressing intact,not removed    CV:  RRR  no s3 or murmur or increase in P2, and 1+ pitting edema both ankles  ABD:  soft and nontender   MS:    ext warm without deformities Or obvious joint restrictions  calf tenderness, cyanosis or clubbing    SKIN: warm and dry without lesions    NEURO:  alert, approp, nl sensorium with  no motor or cerebellar deficits apparent.    CXR PA and Lateral:   06/15/2023 :    I personally reviewed images and impression is as follows:     Minimal residual effusion. 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   Dr Shyrl placed the catheter and wanted to wait until the pt had a chance to respond to chemo to pull the tube, but it may well have served its purpose at this point. However, we do not have the equipment to pull the tube here (apparently this was already tried by nursing today) so I left the sterile dressing intact and will sent Dr Shyrl a  message re patient's wishes and let him sort out the risk/benefit of leaving it in longer.    In meantime I have refilled her tramadol   50 mg which she can take up to every 4 hours as need but also informed here that as the fluid builds up again, even a small amt will serve as a buffer and likely alleviate some of the pain and should be able to taper off the analgesics over the next week.   Nocturnal hypoxemia As of 06/15/2023  using 2lpm hs and prn daytime   Advised daytime  Make sure you check your oxygen saturation  AT  your highest level of activity (not after you stop)   to be sure it stays over 90% and adjust  02 flow upward to maintain this level if needed    As she is very sedentary during the day her 02 demainds are low enough for now to maintain sats > 90% at rest RA       Each maintenance medication was reviewed in detail including emphasizing most importantly the difference between maintenance and prns and under what circumstances the prns are to be triggered using an action plan format where appropriate.  Total time for H and P, chart review, counseling, reviewing neb/02/pulse ox  device(s) and generating customized AVS unique to this office visit / same day charting  > 40 min with pt new to me         .     Ozell America,  MD 06/15/2023

## 2023-06-15 NOTE — Assessment & Plan Note (Addendum)
 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   Dr Shyrl placed the catheter and wanted to wait until the pt had a chance to respond to chemo to pull the tube, but it may well have served its purpose at this point. However, we do not have the equipment to pull the tube here (apparently this was already tried by nursing today) so I left the sterile dressing intact and will sent Dr Shyrl a message re patient's wishes and let him sort out the risk/benefit of leaving it in longer.    In meantime I have refilled her tramadol   50 mg which she can take up to every 4 hours as need but also informed here that as the fluid builds up again, even a small amt will serve as a buffer and likely alleviate some of the pain and should be able to taper off the analgesics over the next week.

## 2023-06-19 NOTE — Telephone Encounter (Signed)
 Pt scheduled at [facility] on 06/30/23

## 2023-07-03 ENCOUNTER — Encounter: Payer: Self-pay | Admitting: Pulmonary Disease

## 2023-07-03 ENCOUNTER — Ambulatory Visit (INDEPENDENT_AMBULATORY_CARE_PROVIDER_SITE_OTHER): Payer: Medicare (Managed Care) | Admitting: Pulmonary Disease

## 2023-07-03 VITALS — BP 124/60 | HR 78 | Temp 97.7°F | Ht 60.0 in | Wt 142.4 lb

## 2023-07-03 DIAGNOSIS — J90 Pleural effusion, not elsewhere classified: Secondary | ICD-10-CM

## 2023-07-03 NOTE — Progress Notes (Signed)
Synopsis: Referred in by [facility] Service*   Subjective:   PATIENT ID: [name] GENDER: female DOB: 04-11-1946, MRN: [id number]  Chief Complaint  Patient presents with   Follow-up    DOE. No wheezing. Coughing up blood/clots.    HPI [name] is a 78 year old female patient with a past medical history of paroxysmal A-fib on Eliquis, asthma, hypertension, hypothyroidism, history of DVT who is presenting today to the pulmonary office for follow-up on pleural effusion.  She presented initially to [facility] in November for shortness of breath, night sweats, unintentional weight loss and loss of appetite.  She was found to have a large right pleural effusion.  She underwent a thoracentesis on 11/3 which revealed an exudative effusion with lymphocyte predominance.  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 Flow cytometry was sent which was consistent with a CD10 positive monoclonal B-cell population.  this prompted a bone marrow biopsy on 11/20 which did not show any CD34 positive blastic population nor monoclonal B-cell population or abnormal T-cell.  Blood flow cytometry was also negative.  CT abdomen pelvis 11/8 nodular liver contour strongly suggestive of cirrhosis.  CT chest 11/8 moderate to large layering right pleural effusion.  No mediastinal lymphadenopathy.  No other suspicious lesions.  PET scan 11/13 -no specific findings for malignancy.  Redemonstrated liver cirrhosis and moderate right pleural effusion.  Echocardiogram 11/03 -LVEF 65 to 70%.  Normal RV function.  No significant valvular disease.  She has had 4 ultrasound guided thoracentesis so far. Seen by Dr. Cathie Hoops and suspicion for pleural lymphoma.   S/p pleural biopsy and IPC placement on 05/29/2023. Path with prominent atypical lymphoid cells with B-cell population predominance.   She was referred to [facility] for a second opinion and reportedly has  mentioned that no plan for chemo and this be followed clinically.   Her IPC has not been draining much and it hasn't been accessed for 2 weeks.  [place] in office without any effusion.   Echocardiogram 11/03 - with normal EF 55 to 60%, Grade I diastolic dysfunction. Normal RV size and function and no signs of tricuspid regurgitation.   Family history -no family history of pulmonary disease.  Social history -never smoker, denies alcohol use and denies any illicit drug use.  ROS All systems were reviewed and are negative except for the above.  Objective:   Vitals:   07/03/23 1140  BP: 124/60  Pulse: 78  Temp: 97.7 F (36.5 C)  SpO2: 93%  Weight: 142 lb 6.4 oz (64.6 kg)  Height: 5' (1.524 m)   93% on RA BMI Readings from Last 3 Encounters:  07/03/23 27.81 kg/m  06/15/23 27.81 kg/m  06/09/23 27.34 kg/m   Wt Readings from Last 3 Encounters:  07/03/23 142 lb 6.4 oz (64.6 kg)  06/15/23 142 lb 6.4 oz (64.6 kg)  06/09/23 140 lb (63.5 kg)    Physical Exam GEN: NAD, Healthy Appearing HEENT: Supple Neck, Reactive Pupils, EOMI  CVS: Normal S1, Normal S2, RRR, No murmurs or ES appreciated  Lungs: Bibasilar crackles noted. Faint Expiratory wheezing also noted.  Abdomen: Soft, non tender, non distended, + BS  Extremities: Warm and well perfused, No edema  Skin: No suspicious lesions appreciated  Psych: Normal Affect  Labs and imaging were reviewed.  Ancillary Information   CBC    Component Value Date/Time   WBC 10.4 05/30/2023 0506   RBC 2.93 (L) 05/30/2023 0506   HGB 8.6 (L) 05/30/2023 2595  HCT 27.9 (L) 05/30/2023 0506   PLT 181 05/30/2023 0506   MCV 95.2 05/30/2023 0506   MCH 29.4 05/30/2023 0506   MCHC 30.8 05/30/2023 0506   RDW 14.6 05/30/2023 0506   LYMPHSABS 1.8 04/29/2023 1349   MONOABS 0.8 04/29/2023 1349   EOSABS 0.1 04/29/2023 1349   BASOSABS 0.0 04/29/2023 1349       No data to display           Assessment & Plan:  [name] is a 78 year old  female patient with a past medical history of paroxysmal A-fib on Eliquis, asthma, hypertension, hypothyroidism, history of DVT who is presenting today to the pulmonary office for follow-up on pleural effusion.  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  Fluid recurrence could be impacted by liver cirrhosis as well.  Ultrasound in office shows recurrence of right pleural effusion, anechoic noncomplex.  Ordered chest x-ray.    S/p pleural biopsy and IPC placement on 05/29/2023. Path with prominent atypical lymphoid cells with B-cell population predominance.   She was referred to [facility] for a second opinion and reportedly has mentioned that no plan for chemo and this be followed clinically.   Her IPC has not been draining much and it hasn't been accessed for 2 weeks.  [place] in office without any effusion.   Furthermore she is complaining of worsening dyspnea and does have extensive wheezing tells me this is longstanding.  PFTs were ordered however she would like to finish evaluation for pleural lymphoma first before tackling other problems.  She is currently on Advair discus and nebulizer as needed.  Will plan to bring her back on 01/29 for an IPC removal.   No follow-ups on file.  I spent 39 minutes caring for this patient today, including preparing to see the patient, obtaining a medical history , reviewing a separately obtained history, performing a medically appropriate examination and/or evaluation, counseling and educating the  patient/family/caregiver, ordering medications, tests, or procedures, documenting clinical information in the electronic health record, and independently interpreting results (not separately reported/billed) and communicating results to the patient/family/caregiver  Janann Colonel, MD [facility] 07/03/2023 1:43 PM

## 2023-07-03 NOTE — H&P (View-Only) (Signed)
Synopsis: Referred in by [facility] Service*   Subjective:   PATIENT ID: Lajuana Matte GENDER: female DOB: 04-11-1946, MRN: [id number]  Chief Complaint  Patient presents with   Follow-up    DOE. No wheezing. Coughing up blood/clots.    HPI [name] is a 78 year old female patient with a past medical history of paroxysmal A-fib on Eliquis, asthma, hypertension, hypothyroidism, history of DVT who is presenting today to the pulmonary office for follow-up on pleural effusion.  She presented initially to [facility] in November for shortness of breath, night sweats, unintentional weight loss and loss of appetite.  She was found to have a large right pleural effusion.  She underwent a thoracentesis on 11/3 which revealed an exudative effusion with lymphocyte predominance.  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 Flow cytometry was sent which was consistent with a CD10 positive monoclonal B-cell population.  this prompted a bone marrow biopsy on 11/20 which did not show any CD34 positive blastic population nor monoclonal B-cell population or abnormal T-cell.  Blood flow cytometry was also negative.  CT abdomen pelvis 11/8 nodular liver contour strongly suggestive of cirrhosis.  CT chest 11/8 moderate to large layering right pleural effusion.  No mediastinal lymphadenopathy.  No other suspicious lesions.  PET scan 11/13 -no specific findings for malignancy.  Redemonstrated liver cirrhosis and moderate right pleural effusion.  Echocardiogram 11/03 -LVEF 65 to 70%.  Normal RV function.  No significant valvular disease.  She has had 4 ultrasound guided thoracentesis so far. Seen by Dr. Cathie Hoops and suspicion for pleural lymphoma.   S/p pleural biopsy and IPC placement on 05/29/2023. Path with prominent atypical lymphoid cells with B-cell population predominance.   She was referred to [facility] for a second opinion and reportedly has  mentioned that no plan for chemo and this be followed clinically.   Her IPC has not been draining much and it hasn't been accessed for 2 weeks.  [place] in office without any effusion.   Echocardiogram 11/03 - with normal EF 55 to 60%, Grade I diastolic dysfunction. Normal RV size and function and no signs of tricuspid regurgitation.   Family history -no family history of pulmonary disease.  Social history -never smoker, denies alcohol use and denies any illicit drug use.  ROS All systems were reviewed and are negative except for the above.  Objective:   Vitals:   07/03/23 1140  BP: 124/60  Pulse: 78  Temp: 97.7 F (36.5 C)  SpO2: 93%  Weight: 142 lb 6.4 oz (64.6 kg)  Height: 5' (1.524 m)   93% on RA BMI Readings from Last 3 Encounters:  07/03/23 27.81 kg/m  06/15/23 27.81 kg/m  06/09/23 27.34 kg/m   Wt Readings from Last 3 Encounters:  07/03/23 142 lb 6.4 oz (64.6 kg)  06/15/23 142 lb 6.4 oz (64.6 kg)  06/09/23 140 lb (63.5 kg)    Physical Exam GEN: NAD, Healthy Appearing HEENT: Supple Neck, Reactive Pupils, EOMI  CVS: Normal S1, Normal S2, RRR, No murmurs or ES appreciated  Lungs: Bibasilar crackles noted. Faint Expiratory wheezing also noted.  Abdomen: Soft, non tender, non distended, + BS  Extremities: Warm and well perfused, No edema  Skin: No suspicious lesions appreciated  Psych: Normal Affect  Labs and imaging were reviewed.  Ancillary Information   CBC    Component Value Date/Time   WBC 10.4 05/30/2023 0506   RBC 2.93 (L) 05/30/2023 0506   HGB 8.6 (L) 05/30/2023 2595  HCT 27.9 (L) 05/30/2023 0506   PLT 181 05/30/2023 0506   MCV 95.2 05/30/2023 0506   MCH 29.4 05/30/2023 0506   MCHC 30.8 05/30/2023 0506   RDW 14.6 05/30/2023 0506   LYMPHSABS 1.8 04/29/2023 1349   MONOABS 0.8 04/29/2023 1349   EOSABS 0.1 04/29/2023 1349   BASOSABS 0.0 04/29/2023 1349       No data to display           Assessment & Plan:  [name] is a 78 year old  female patient with a past medical history of paroxysmal A-fib on Eliquis, asthma, hypertension, hypothyroidism, history of DVT who is presenting today to the pulmonary office for follow-up on pleural effusion.  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  Fluid recurrence could be impacted by liver cirrhosis as well.  Ultrasound in office shows recurrence of right pleural effusion, anechoic noncomplex.  Ordered chest x-ray.    S/p pleural biopsy and IPC placement on 05/29/2023. Path with prominent atypical lymphoid cells with B-cell population predominance.   She was referred to [facility] for a second opinion and reportedly has mentioned that no plan for chemo and this be followed clinically.   Her IPC has not been draining much and it hasn't been accessed for 2 weeks.  [place] in office without any effusion.   Furthermore she is complaining of worsening dyspnea and does have extensive wheezing tells me this is longstanding.  PFTs were ordered however she would like to finish evaluation for pleural lymphoma first before tackling other problems.  She is currently on Advair discus and nebulizer as needed.  Will plan to bring her back on 01/29 for an IPC removal.   No follow-ups on file.  I spent 39 minutes caring for this patient today, including preparing to see the patient, obtaining a medical history , reviewing a separately obtained history, performing a medically appropriate examination and/or evaluation, counseling and educating the  patient/family/caregiver, ordering medications, tests, or procedures, documenting clinical information in the electronic health record, and independently interpreting results (not separately reported/billed) and communicating results to the patient/family/caregiver  Janann Colonel, MD [facility] 07/03/2023 1:43 PM

## 2023-07-05 ENCOUNTER — Ambulatory Visit
Admission: RE | Admit: 2023-07-05 | Discharge: 2023-07-05 | Disposition: A | Discharge status: Home / Self Care | Payer: Medicare (Managed Care) | Attending: Pulmonary Disease | Admitting: Pulmonary Disease

## 2023-07-05 ENCOUNTER — Encounter
Admission: RE | Disposition: A | Discharge status: Home / Self Care | Payer: Self-pay | Source: Home / Self Care | Attending: Pulmonary Disease

## 2023-07-05 ENCOUNTER — Ambulatory Visit: Payer: Medicare (Managed Care)

## 2023-07-05 ENCOUNTER — Ambulatory Visit: Payer: Medicare (Managed Care) | Admitting: Pulmonary Disease

## 2023-07-05 ENCOUNTER — Encounter: Payer: Self-pay | Admitting: Pulmonary Disease

## 2023-07-05 DIAGNOSIS — I480 Paroxysmal atrial fibrillation: Secondary | ICD-10-CM | POA: Diagnosis not present

## 2023-07-05 DIAGNOSIS — J90 Pleural effusion, not elsewhere classified: Secondary | ICD-10-CM | POA: Diagnosis not present

## 2023-07-05 DIAGNOSIS — Z7901 Long term (current) use of anticoagulants: Secondary | ICD-10-CM | POA: Insufficient documentation

## 2023-07-05 DIAGNOSIS — J4550 Severe persistent asthma, uncomplicated: Secondary | ICD-10-CM | POA: Insufficient documentation

## 2023-07-05 DIAGNOSIS — Z4803 Encounter for change or removal of drains: Secondary | ICD-10-CM | POA: Diagnosis present

## 2023-07-05 DIAGNOSIS — I10 Essential (primary) hypertension: Secondary | ICD-10-CM | POA: Diagnosis not present

## 2023-07-05 DIAGNOSIS — Z86718 Personal history of other venous thrombosis and embolism: Secondary | ICD-10-CM | POA: Diagnosis not present

## 2023-07-05 DIAGNOSIS — K7460 Unspecified cirrhosis of liver: Secondary | ICD-10-CM | POA: Diagnosis not present

## 2023-07-05 HISTORY — PX: REMOVAL OF PLEURAL DRAINAGE CATHETER: SHX5080

## 2023-07-05 SURGERY — REMOVAL OF PLEURAL DRAINAGE CATHETER
Anesthesia: LOCAL | Laterality: Right

## 2023-07-05 MED ORDER — HYDROMORPHONE HCL 2 MG PO TABS
1.0000 mg | ORAL_TABLET | Freq: Once | ORAL | Status: AC
Start: 2023-07-05 — End: 2023-07-05
  Administered 2023-07-05: 1 mg via ORAL

## 2023-07-05 MED ORDER — HYDROMORPHONE HCL 2 MG PO TABS
ORAL_TABLET | ORAL | Status: AC
  Filled 2023-07-05: qty 1

## 2023-07-05 NOTE — Progress Notes (Signed)
This RN requested information from [facility] pharmacist regarding coverage for medications needed for pain management over night.  Spoke with pharmacist Rayfield Citizen; two scripts will need to be sent to [facility], one for coverage over night and a second script for any coverage thereafter for [facility] covering MD to verify prior to medication being sent to outside pharmacy.  Information sent to Dr Larinda Buttery.

## 2023-07-05 NOTE — Discharge Instructions (Signed)
Call your doctor or seek immediate medical care if:  You have trouble breathing. Your shortness of breath is getting worse. Bright red blood soaks through the bandage over your incision. You have symptoms of infection where the tube was put in, such as:             Increased pain, swelling, warmth, or redness.             Red streaks leading from the area.             Pus draining from the area.             A fever.

## 2023-07-05 NOTE — Op Note (Addendum)
Pleurx removal procedure  Patient was brought into the procedure room, timeout was performed with the nurse in attendance.  The Pleurx was exposed and was sterilely cleaned and draped.  I used gown and sterile gloves and stated he was maintained throughout the procedure.  I used 10 cc of 1% lidocaine to numb the area on the track.  Then proceeded with dissection of the track using a clamp to release the cuff.  That was released after multiple attempts and Pleurx catheter was removed.  3 simple interrupted sutures were applied at the entry site.  Gauze was applied with Tegaderm.  Patient was instructed to keep that in place for 48 hours and change dressing daily.  She was also instructed to follow-up with [place] in 1 week for suture removal.  Chest x-ray post removal without any signs of pneumothorax or pleural effusion.  Janann Colonel, MD [facility] 07/05/2023 4:27 PM

## 2023-07-05 NOTE — Interval H&P Note (Signed)
Patient here and adequate for pleurx removal.

## 2023-07-06 ENCOUNTER — Telehealth: Payer: Self-pay | Admitting: *Deleted

## 2023-07-06 ENCOUNTER — Encounter: Payer: Self-pay | Admitting: Pulmonary Disease

## 2023-07-06 NOTE — Telephone Encounter (Signed)
Verlon Au from [place] [facility] wants to speak to Dr. Cathie Hoops about patient [name] and go over the consult with [facility] and the expectations for follow-up.  Verlon Au would like Dr. Cathie Hoops to call her at [phone] and asked for [facility]

## 2023-07-07 ENCOUNTER — Encounter: Payer: Self-pay | Admitting: Oncology

## 2023-07-07 ENCOUNTER — Ambulatory Visit: Admit: 2023-07-07 | Discharge: 2023-07-07 | Payer: MEDICARE | Attending: Audiologist

## 2023-07-07 DIAGNOSIS — H903 Sensorineural hearing loss, bilateral: Secondary | ICD-10-CM

## 2023-07-07 NOTE — Telephone Encounter (Signed)
Please schedule MD appt and inform pt of appt.

## 2023-07-07 NOTE — Progress Notes (Signed)
 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  Pt has previously seen A.V.

## 2023-07-07 NOTE — Progress Notes (Signed)
 HAE: Reviewed audiogram with pt.  Pt is a retired technical sales engineer. She enjoys spending time with her family. She has difficulty hearing during German class and in noise  Reviewed HA styles and technology  Demonstrated Audeo RIC  Discussed HA styles and technology  Discussed realistic HA expectations  Pt has decided to proceed with Audeo 70 (p1)  Pt to follow-up in 3 weeks for HAF

## 2023-07-10 ENCOUNTER — Emergency Department: Payer: Medicare (Managed Care)

## 2023-07-10 ENCOUNTER — Other Ambulatory Visit: Payer: Self-pay

## 2023-07-10 ENCOUNTER — Telehealth: Payer: Self-pay | Admitting: Oncology

## 2023-07-10 ENCOUNTER — Emergency Department
Admission: EM | Admit: 2023-07-10 | Discharge: 2023-07-10 | Disposition: A | Discharge status: Home / Self Care | Payer: Medicare (Managed Care) | Attending: Student in an Organized Health Care Education/Training Program | Admitting: Student in an Organized Health Care Education/Training Program

## 2023-07-10 DIAGNOSIS — D649 Anemia, unspecified: Secondary | ICD-10-CM | POA: Insufficient documentation

## 2023-07-10 DIAGNOSIS — C8510 Unspecified B-cell lymphoma, unspecified site: Secondary | ICD-10-CM

## 2023-07-10 DIAGNOSIS — R531 Weakness: Secondary | ICD-10-CM | POA: Diagnosis present

## 2023-07-10 LAB — BASIC METABOLIC PANEL
Anion gap: 13 (ref 5–15)
BUN: 19 mg/dL (ref 8–23)
CO2: 20 mmol/L — ABNORMAL LOW (ref 22–32)
Calcium: 9 mg/dL (ref 8.9–10.3)
Chloride: 103 mmol/L (ref 98–111)
Creatinine, Ser: 0.65 mg/dL (ref 0.44–1.00)
GFR, Estimated: >60 mL/min (ref >60)
Glucose, Bld: 124 mg/dL — ABNORMAL HIGH (ref 70–99)
Potassium: 4.2 mmol/L (ref 3.5–5.1)
Sodium: 136 mmol/L (ref 135–145)

## 2023-07-10 LAB — CBC
HCT: 22.5 % — ABNORMAL LOW (ref 36.0–46.0)
Hemoglobin: 6.5 g/dL — ABNORMAL LOW (ref 12.0–15.0)
MCH: 23 pg — ABNORMAL LOW (ref 26.0–34.0)
MCHC: 28.9 g/dL — ABNORMAL LOW (ref 30.0–36.0)
MCV: 79.5 fL — ABNORMAL LOW (ref 80.0–100.0)
Platelets: 237 10*3/uL (ref 150–400)
RBC: 2.83 MIL/uL — ABNORMAL LOW (ref 3.87–5.11)
RDW: 17.4 % — ABNORMAL HIGH (ref 11.5–15.5)
WBC: 7.1 10*3/uL (ref 4.0–10.5)
nRBC: 0 % (ref 0.0–0.2)

## 2023-07-10 LAB — FERRITIN: Ferritin: 61 ng/mL (ref 11–307)

## 2023-07-10 LAB — FOLATE: Folate: 21.5 ng/mL (ref >5.9)

## 2023-07-10 LAB — RETICULOCYTES
Immature Retic Fract: 10.8 % (ref 2.3–15.9)
RBC.: 2.55 MIL/uL — ABNORMAL LOW (ref 3.87–5.11)
Retic Count, Absolute: 66.5 10*3/uL (ref 19.0–186.0)
Retic Ct Pct: 2.6 % (ref 0.4–3.1)

## 2023-07-10 LAB — IRON AND TIBC
Iron: 15 ug/dL — ABNORMAL LOW (ref 28–170)
Saturation Ratios: 3 % — ABNORMAL LOW (ref 10.4–31.8)
TIBC: 564 ug/dL — ABNORMAL HIGH (ref 250–450)
UIBC: 549 ug/dL

## 2023-07-10 LAB — PREPARE RBC (CROSSMATCH)

## 2023-07-10 MED ORDER — MORPHINE SULFATE (PF) 4 MG/ML IV SOLN: 4.0000 mg | INTRAVENOUS | Status: DC | PRN

## 2023-07-10 MED ORDER — ACETAMINOPHEN 325 MG PO TABS
650.0000 mg | ORAL_TABLET | Freq: Once | ORAL | Status: AC
  Administered 2023-07-10: 650 mg via ORAL
  Filled 2023-07-10: qty 2

## 2023-07-10 MED ORDER — HYDROCODONE-ACETAMINOPHEN 5-325 MG PO TABS
1.0000 | ORAL_TABLET | Freq: Once | ORAL | Status: AC
  Administered 2023-07-10: 1 via ORAL
  Filled 2023-07-10: qty 1

## 2023-07-10 MED ORDER — MORPHINE SULFATE (PF) 2 MG/ML IV SOLN
2.0000 mg | INTRAVENOUS | Status: DC | PRN
  Administered 2023-07-10: 2 mg via INTRAVENOUS
  Filled 2023-07-10: qty 1

## 2023-07-10 MED ORDER — ALPRAZOLAM 0.25 MG PO TABS
0.2500 mg | ORAL_TABLET | Freq: Once | ORAL | Status: AC
  Administered 2023-07-10: 0.25 mg via ORAL
  Filled 2023-07-10: qty 1

## 2023-07-10 MED ORDER — IBUPROFEN 600 MG PO TABS: 600.0000 mg | ORAL_TABLET | Freq: Once | ORAL | Status: DC

## 2023-07-10 MED ORDER — SODIUM CHLORIDE 0.9 % IV SOLN
10.0000 mL/h | Freq: Once | INTRAVENOUS | Status: AC
  Administered 2023-07-10: 10 mL/h via INTRAVENOUS

## 2023-07-10 NOTE — ED Notes (Signed)
Pt to ED via [facility] from home for abnormal lab levels. Pt is not sure which labs were abnormal but was told her needed a blood transfusion.

## 2023-07-10 NOTE — ED Provider Triage Note (Signed)
Emergency Medicine Provider Triage Evaluation Note  [name] , a 78 y.o. female  was evaluated in triage.  Pt complains of low hemoglobin, shortness of breath, low energy.  Patient was referred by her PCP for further studies.  Patient had a chest tube removed in the right side last week, patient complains of pain in that area.  States PCP ordered to take only Tylenol for pain  Review of Systems  Positive:  Negative:   Physical Exam  BP 125/67 (BP Location: Left Arm)   Pulse 75   Temp 98.2 F (36.8 C) (Oral)   Resp 18   Ht 5' (1.524 m)   Wt 64.4 kg   SpO2 98%   BMI 27.73 kg/m  Gen:   Awake, no distress   Resp:  Normal effort  MSK:   Moves extremities without difficulty  Other:    Medical Decision Making  Medically screening exam initiated at 4:30 PM.  Appropriate orders placed.  [name] was informed that the remainder of the evaluation will be completed by another provider, this initial triage assessment does not replace that evaluation, and the importance of remaining in the ED until their evaluation is complete.  Patient with symptoms of anemia ordered CBC CMP type and cross, Tylenol 650 mg pain   Gladys Damme, PA-C 07/10/23 1631

## 2023-07-10 NOTE — ED Triage Notes (Signed)
Pt reports her doctor sent her to the ER for low hemoglobin but does not know what it was. She reports she feels very weak. She reports back pain due to recent removal of chest tubes last week.

## 2023-07-10 NOTE — Telephone Encounter (Signed)
Kia from [facility] called to see if pt could be seen this week due to the possibility of needing blood. She would like a call back at [phone]

## 2023-07-10 NOTE — ED Notes (Signed)
Per patient request, son called for ride home.

## 2023-07-10 NOTE — ED Notes (Signed)
Family updated as to patient's status. Per patient request, [name] (son) called at : [phone] at this time.

## 2023-07-10 NOTE — ED Provider Notes (Addendum)
[facility] Provider Note    Event Date/Time   First MD Initiated Contact with Patient 07/10/23 2013     (approximate)   History   Abnormal Lab   HPI  [name] is a 78 y.o. female presenting to the ER for evaluation of weakness and shortness of breath as well as being sent to the ER by primary provider due to outpatient blood work showing that she had acute anemia.  Has never required transfusion in the past but has had recent deterioration in health given history of lymphoma with effusions requiring Pleurx drainage.  States for several months she was having frequent bloody cough hemoptysis.  Denies any nosebleeds no melena no hematochezia.     Physical Exam   Triage Vital Signs: ED Triage Vitals  Encounter Vitals Group     BP 07/10/23 1622 125/67     Systolic BP Percentile --      Diastolic BP Percentile --      Pulse Rate 07/10/23 1622 75     Resp 07/10/23 1622 18     Temp 07/10/23 1622 98.2 F (36.8 C)     Temp Source 07/10/23 1622 Oral     SpO2 07/10/23 1622 98 %     Weight 07/10/23 1620 142 lb (64.4 kg)     Height 07/10/23 1620 5' (1.524 m)     Head Circumference --      Peak Flow --      Pain Score 07/10/23 1619 10     Pain Loc --      Pain Education --      Exclude from Growth Chart --     Most recent vital signs: Vitals:   07/10/23 2200 07/10/23 2214  BP: (!) 161/76 (!) 148/93  Pulse: 79 72  Resp: 17 14  Temp: 98.2 F (36.8 C)   SpO2: 95% 96%     Constitutional: Alert  Eyes: Conjunctivae are normal.  Head: Atraumatic. Nose: No congestion/rhinnorhea. Mouth/Throat: Mucous membranes are moist.   Neck: Painless ROM.  Cardiovascular:   Good peripheral circulation. Respiratory: Normal respiratory effort.  No retractions.  Gastrointestinal: Soft and nontender.  Musculoskeletal:  no deformity Neurologic:  MAE spontaneously. No gross focal neurologic deficits are appreciated.  Skin:  Skin is warm, dry and  intact. No rash noted. Psychiatric: Mood and affect are normal. Speech and behavior are normal.    ED Results / Procedures / Treatments   Labs (all labs ordered are listed, but only abnormal results are displayed) Labs Reviewed  CBC - Abnormal; Notable for the following components:      Result Value   RBC 2.83 (*)    Hemoglobin 6.5 (*)    HCT 22.5 (*)    MCV 79.5 (*)    MCH 23.0 (*)    MCHC 28.9 (*)    RDW 17.4 (*)    All other components within normal limits  BASIC METABOLIC PANEL - Abnormal; Notable for the following components:   CO2 20 (*)    Glucose, Bld 124 (*)    All other components within normal limits  IRON AND TIBC - Abnormal; Notable for the following components:   Iron 15 (*)    TIBC 564 (*)    Saturation Ratios 3 (*)    All other components within normal limits  RETICULOCYTES - Abnormal; Notable for the following components:   RBC. 2.55 (*)    All other components within normal limits  FOLATE  FERRITIN  VITAMIN B12  TYPE AND SCREEN  PREPARE RBC (CROSSMATCH)      RADIOLOGY Please see ED Course for my review and interpretation.  I personally reviewed all radiographic images ordered to evaluate for the above acute complaints and reviewed radiology reports and findings.  These findings were personally discussed with the patient.  Please see medical record for radiology report.    PROCEDURES:  Critical Care performed: Yes, see critical care procedure note(s)  .Critical Care  Performed by: Willy Eddy, MD Authorized by: Willy Eddy, MD   Critical care provider statement:    Critical care time (minutes):  30   Critical care was necessary to treat or prevent imminent or life-threatening deterioration of the following conditions:  Circulatory failure   Critical care was time spent personally by me on the following activities:  Development of treatment plan with patient or surrogate, discussions with consultants, evaluation of patient's  response to treatment, examination of patient, ordering and review of laboratory studies, ordering and review of radiographic studies, ordering and performing treatments and interventions, pulse oximetry, re-evaluation of patient's condition and review of old charts    MEDICATIONS ORDERED IN ED: Medications  morphine (PF) 2 MG/ML injection 2 mg (has no administration in time range)  ALPRAZolam (XANAX) tablet 0.25 mg (has no administration in time range)  acetaminophen (TYLENOL) tablet 650 mg (650 mg Oral Given 07/10/23 1646)  0.9 %  sodium chloride infusion (10 mL/hr Intravenous New Bag/Given 07/10/23 2058)  HYDROcodone-acetaminophen (NORCO/VICODIN) 5-325 MG per tablet 1 tablet (1 tablet Oral Given 07/10/23 2105)     IMPRESSION / MDM / ASSESSMENT AND PLAN / ED COURSE  I reviewed the triage vital signs and the nursing notes.                              Differential diagnosis includes, but is not limited to, symptomatic anemia, iron deficiency anemia, anemia of chronic disease, acute blood loss anemia  Patient presenting to the ER for evaluation of symptoms as described above.  Based on symptoms, risk factors and considered above differential, this presenting complaint could reflect a potentially life-threatening illness therefore the patient will be placed on continuous pulse oximetry and telemetry for monitoring.  Laboratory evaluation will be sent to evaluate for the above complaints.      Clinical Course as of 07/10/23 2248  Mon Jul 10, 2023  2029 Have attempted to contact the patient's pace provider.  Do suspect that patient's hemoglobin likely downtrending from recent Pleurx catheter and states that she is been having hemoptysis for several months which has been known about.  She does feel weak.  Will order chest x-ray to see if there is recurrent effusions.  She denies any melena no hematochezia. [PR]  2154 I discussed the case in consultation with Dr. Ihor Gully patient's pace program who  agrees that this is more likely chronic anemia.  Patient is not having any signs or symptoms of acute blood loss anemia.  I do believe she be appropriate for 1 unit transfusion and she actually has follow-up with hematology this week. [PR]  2220 Patient reassessed.  Does proved after blood transfusion.  Anemia panel consistent with iron deficiency.  Patient states that she is having pain in her back which she reports is chronic but asking for something for pain.  Patient also very anxious appearing.  States she does typically take Xanax in the evening.  Will give her dose here.  Patient does appear stable and appropriate for outpatient follow-up. [PR]    Clinical Course User Index [PR] Willy Eddy, MD     FINAL CLINICAL IMPRESSION(S) / ED DIAGNOSES   Final diagnoses:  Symptomatic anemia     Rx / DC Orders   ED Discharge Orders     None        Note:  This document was prepared using Dragon voice recognition software and may include unintentional dictation errors.    Willy Eddy, MD 07/10/23 Cleophas Dunker    Willy Eddy, MD 07/10/23 [phone]

## 2023-07-11 LAB — TYPE AND SCREEN
ABO/RH(D): A POS
Antibody Screen: NEGATIVE
Unit division: 0

## 2023-07-11 LAB — BPAM RBC
Blood Product Expiration Date: 202502142359
ISSUE DATE / TIME: 202502032040
Unit Type and Rh: 600

## 2023-07-12 ENCOUNTER — Encounter: Payer: Self-pay | Admitting: Pulmonary Disease

## 2023-07-12 ENCOUNTER — Ambulatory Visit: Payer: Medicare (Managed Care) | Admitting: Pulmonary Disease

## 2023-07-12 VITALS — BP 110/60 | HR 83 | Temp 97.1°F | Ht 60.0 in | Wt 142.0 lb

## 2023-07-12 DIAGNOSIS — J90 Pleural effusion, not elsewhere classified: Secondary | ICD-10-CM

## 2023-07-12 DIAGNOSIS — R0602 Shortness of breath: Secondary | ICD-10-CM

## 2023-07-12 NOTE — Progress Notes (Signed)
 Synopsis: Referred in by [facility] Service*   Subjective:   PATIENT ID: [name]  Vicci BILIS: female DOB: 04-02-1946, MRN: [id number]  Chief Complaint  Patient presents with   Follow-up    SOB started a few days ago. No Wheezing. Cough with bloody sputum.    HPI [name] is a 78 year old female patient with a past medical history of paroxysmal A-fib on Eliquis , asthma, hypertension, hypothyroidism, history of DVT who is presenting today to the pulmonary office for follow-up on pleural effusion.  She presented initially to [facility] in November for shortness of breath, night sweats, unintentional weight loss and loss of appetite.  She was found to have a large right pleural effusion.  She underwent a thoracentesis on 11/3 which revealed an exudative effusion with lymphocyte predominance.  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 Flow cytometry was sent which was consistent with a CD10 positive monoclonal B-cell population.  this prompted a bone marrow biopsy on 11/20 which did not show any CD34 positive blastic population nor monoclonal B-cell population or abnormal T-cell.  Blood flow cytometry was also negative.  CT abdomen pelvis 11/8 nodular liver contour strongly suggestive of cirrhosis.  CT chest 11/8 moderate to large layering right pleural effusion.  No mediastinal lymphadenopathy.  No other suspicious lesions.  PET scan 11/13 -no specific findings for malignancy.  Redemonstrated liver cirrhosis and moderate right pleural effusion.  Echocardiogram 11/03 -LVEF 65 to 70%.  Normal RV function.  No significant valvular disease.  She has had 4 ultrasound guided thoracentesis so far. Seen by Dr. Babara and suspicion for pleural lymphoma.   S/p pleural biopsy and IPC placement on 05/29/2023. Path with prominent atypical lymphoid cells with B-cell population predominance.   She was referred to [facility] for a second  opinion and reportedly has mentioned that no plan for chemo and this be followed clinically.   Her IPC has not been draining much and it hasn't been accessed for 2 weeks. US  in office without any effusion.   Presenting today to the pulmonary clinic for suture removal. Also complaining of shortness of breath on exertion. She was found to be severely anemic over the week end with Hgb 6.5 g/dl. She received 1 unit pRBC.   Echocardiogram 11/03 - with normal EF 55 to 60%, Grade I diastolic dysfunction. Normal RV size and function and no signs of tricuspid regurgitation.   Family history -no family history of pulmonary disease.  Social history -never smoker, denies alcohol  use and denies any illicit drug use.  ROS All systems were reviewed and are negative except for the above.  Objective:   Vitals:   07/12/23 1556  BP: 110/60  Pulse: 83  Temp: (!) 97.1 F (36.2 C)  SpO2: 99%  Weight: 142 lb (64.4 kg)  Height: 5' (1.524 m)   99% on RA BMI Readings from Last 3 Encounters:  07/12/23 27.73 kg/m  07/10/23 27.73 kg/m  07/03/23 27.81 kg/m   Wt Readings from Last 3 Encounters:  07/12/23 142 lb (64.4 kg)  07/10/23 142 lb (64.4 kg)  07/03/23 142 lb 6.4 oz (64.6 kg)    Physical Exam GEN: NAD, Healthy Appearing HEENT: Supple Neck, Reactive Pupils, EOMI  CVS: Normal S1, Normal S2, RRR, No murmurs or ES appreciated  Lungs: Bibasilar crackles noted.  Abdomen: Soft, non tender, non distended, + BS  Extremities: Warm and well perfused, No edema  Skin: No suspicious lesions appreciated  Psych: Normal Affect  US  thorax  in clinic without recurrent effusion.   Labs and imaging were reviewed.  Ancillary Information   CBC    Component Value Date/Time   WBC 7.1 07/10/2023 1629   RBC 2.83 (L) 07/10/2023 1629   RBC 2.55 (L) 07/10/2023 1629   HGB 6.5 (L) 07/10/2023 1629   HCT 22.5 (L) 07/10/2023 1629   PLT 237 07/10/2023 1629   MCV 79.5 (L) 07/10/2023 1629   MCH 23.0 (L) 07/10/2023  1629   MCHC 28.9 (L) 07/10/2023 1629   RDW 17.4 (H) 07/10/2023 1629   LYMPHSABS 1.8 04/29/2023 1349   MONOABS 0.8 04/29/2023 1349   EOSABS 0.1 04/29/2023 1349   BASOSABS 0.0 04/29/2023 1349       No data to display           Assessment & Plan:  Ms. [name] is a 78 year old female patient with a past medical history of paroxysmal A-fib on Eliquis , asthma, hypertension, hypothyroidism, history of DVT who is presenting today to the pulmonary office for follow-up on pleural effusion.  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  Fluid recurrence could be impacted by liver cirrhosis as well.  Ultrasound in office shows recurrence of right pleural effusion, anechoic noncomplex.  Ordered chest x-ray.    S/p pleural biopsy and IPC placement on 05/29/2023. Path with prominent atypical lymphoid cells with B-cell population predominance.   She was referred to [facility] for a second opinion and reportedly has mentioned that no plan for chemo and this be followed clinically.   Her IPC has not been draining much and it hasn't been accessed for 2 weeks. US  in office without any effusion. S/p IPC removal on 07/05/2023.   She will be seeing her oncologist tomorrow for repeat cbc and further evaluation of her anemia which appears to be secondary to severe iron  drf.   Furthermore she is complaining of worsening dyspnea and does have extensive wheezing tells me this is longstanding.  PFTs ordered. \She is currently on Advair discus and nebulizer as  needed.  Return in about 3 months (around 10/09/2023).  I spent 43 minutes caring for this patient today, including preparing to see the patient, obtaining a medical history , reviewing a separately obtained history, performing a medically appropriate examination and/or evaluation, counseling and educating the patient/family/caregiver, ordering medications, tests, or procedures, documenting clinical information in the electronic health record, and independently interpreting results (not separately reported/billed) and communicating results to the patient/family/caregiver  Darrin Barn, MD [facility] 07/12/2023 7:54 PM

## 2023-07-13 ENCOUNTER — Encounter: Payer: Self-pay | Admitting: Oncology

## 2023-07-13 ENCOUNTER — Inpatient Hospital Stay: Payer: Medicare (Managed Care) | Attending: Oncology

## 2023-07-13 ENCOUNTER — Inpatient Hospital Stay (HOSPITAL_BASED_OUTPATIENT_CLINIC_OR_DEPARTMENT_OTHER): Payer: Medicare (Managed Care) | Admitting: Oncology

## 2023-07-13 VITALS — BP 135/68 | HR 76 | Temp 98.4°F | Resp 18 | Wt 142.2 lb

## 2023-07-13 DIAGNOSIS — J90 Pleural effusion, not elsewhere classified: Secondary | ICD-10-CM

## 2023-07-13 DIAGNOSIS — K7460 Unspecified cirrhosis of liver: Secondary | ICD-10-CM | POA: Diagnosis not present

## 2023-07-13 DIAGNOSIS — C8510 Unspecified B-cell lymphoma, unspecified site: Secondary | ICD-10-CM | POA: Diagnosis not present

## 2023-07-13 DIAGNOSIS — D509 Iron deficiency anemia, unspecified: Secondary | ICD-10-CM

## 2023-07-13 DIAGNOSIS — E1122 Type 2 diabetes mellitus with diabetic chronic kidney disease: Secondary | ICD-10-CM | POA: Diagnosis not present

## 2023-07-13 DIAGNOSIS — N183 Chronic kidney disease, stage 3 unspecified: Secondary | ICD-10-CM

## 2023-07-13 LAB — CBC WITH DIFFERENTIAL (CANCER CENTER ONLY)
Abs Immature Granulocytes: 0.04 10*3/uL (ref 0.00–0.07)
Basophils Absolute: 0.1 10*3/uL (ref 0.0–0.1)
Basophils Relative: 1 %
Eosinophils Absolute: 0.2 10*3/uL (ref 0.0–0.5)
Eosinophils Relative: 2 %
HCT: 24.9 % — ABNORMAL LOW (ref 36.0–46.0)
Hemoglobin: 7.4 g/dL — ABNORMAL LOW (ref 12.0–15.0)
Immature Granulocytes: 1 %
Lymphocytes Relative: 22 %
Lymphs Abs: 1.5 10*3/uL (ref 0.7–4.0)
MCH: 23.6 pg — ABNORMAL LOW (ref 26.0–34.0)
MCHC: 29.7 g/dL — ABNORMAL LOW (ref 30.0–36.0)
MCV: 79.3 fL — ABNORMAL LOW (ref 80.0–100.0)
Monocytes Absolute: 0.4 10*3/uL (ref 0.1–1.0)
Monocytes Relative: 6 %
Neutro Abs: 4.8 10*3/uL (ref 1.7–7.7)
Neutrophils Relative %: 68 %
Platelet Count: 192 10*3/uL (ref 150–400)
RBC: 3.14 MIL/uL — ABNORMAL LOW (ref 3.87–5.11)
RDW: 18.4 % — ABNORMAL HIGH (ref 11.5–15.5)
WBC Count: 7 10*3/uL (ref 4.0–10.5)
nRBC: 0 % (ref 0.0–0.2)

## 2023-07-13 LAB — SAMPLE TO BLOOD BANK

## 2023-07-13 NOTE — Progress Notes (Signed)
 Hematology/Oncology Progress note [phone] [phone]      CHIEF COMPLAINTS/PURPOSE OF CONSULTATION:  Pleural effusion, monoclonal lymphocytosis   ASSESSMENT & PLAN:   Pleural effusion on right 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 Bone marrow negative for lymphoma involvement.  Peripheral blood flow cytometry is negative. 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  second opinion at [facility] reviewed.   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 However, regardless of the exact histology,  would recommend similar management of an indolent, small volume B cell lymphoma resulting in recurrent effusions. Should patient experiences recurrent fluid accumulation, would recommend treatment with a course of Rituximab monotherapy   Pleural biopsy pathology review : While a B-cell predominance raises the possibility of a low-grade lymphoma, flow cytometry was negative for a clonal population and there is no overt immunophenotypic aberrancy.  The current findings are not diagnostic of lymphoma and pending outside DNA studies (B-cell clonality) should help to further exclude lymphoid malignancy.  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   B-cell gene arrangement is negative. Recommend observation for now. Status post pleural  catheter,removal.      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    Iron  deficiency anemia Labs are reviewed and discussed with patient. Lab Results  Component Value Date   HGB 7.4 (L) 07/13/2023   TIBC 564 (H) 07/10/2023   IRONPCTSAT 3 (L) 07/10/2023   FERRITIN 61 07/10/2023    She developed IDA despite taking oral iron  supplementation.  I discussed about option of IV Venofer  treatments. I discussed about the potential risks including but not limited to allergic reactions/infusion reactions including anaphylactic reactions, phlebitis, high blood pressure, wheezing, SOB, skin rash, weight gain,dark urine, leg swelling, back pain, headache, nausea and fatigue, etc. Patient agrees with the plan. Plan IV venofer  weekly x 3  Etiology of IDA, hemopysis vs GI blood loss. She is on Elquis for Afib   Orders Placed This Encounter  Procedures   CBC with Differential (Cancer Center Only)    Standing Status:   Future    Expected Date:   05/30/2024    Expiration Date:   07/12/2024   Iron  and TIBC    Standing Status:   Future    Expected Date:   05/30/2024    Expiration Date:   07/12/2024   Ferritin    Standing Status:   Future    Expected Date:   05/30/2024    Expiration Date:   07/12/2024   Retic Panel    Standing Status:   Future    Expected Date:   05/30/2024    Expiration Date:   07/12/2024   Vitamin B12    Standing Status:   Future    Expected Date:   05/30/2024    Expiration Date:   07/12/2024   Follow-up 6 weeks.  All questions were answered. The patient knows to call the clinic with any problems, questions or concerns.  Zelphia Cap, MD, PhD [facility] Hematology Oncology 07/13/2023    HISTORY OF PRESENTING ILLNESS:  Erin Virginia  [name] 78 y.o. female presents to establish care for pleural effusion, unintentional weight loss.   She  was recently hospitalized due to dyspnea and chest tightness.  04/07/2023  CT scan revealed significant pleural effusion, which was subsequently drained. Flow cytometry of the fluid indicated potential lymphoma cells. The patient has been experiencing inconsistent breathing, with noticeable shortness of breath upon exertion, a significant change from her previous ability to walk for a mile multiple times a week. These symptoms began approximately two weeks prior to the consultation.  The patient's appetite has been notably decreased, with consumption of only one substantial meal per day. This has resulted in a weight loss of over twenty pounds in the past few months. The patient reports some sweating, not on a daily basis, for the past year.  The patient also reports a history of shoulder injury due to a fall down a flight of stairs in the 1990s, which required multiple surgeries. She currently manages the pain with Tramadol . The patient lives alone and has been experiencing difficulty with mobility due to her current health condition.  05/29/23  Pleura, flow cytometry:  -  No immunophenotypic evidence of a lymphoproliferative sorter (i.e. no  monoclonal B cells or immunophenotypically abnormal T cells detected.   A. PLEURA, BIOPSY #1: -  Pleura with prominent atypical lymphoid infiltrate, see note.  B. PLEURA, BIOPSY#2: -  Pleural with prominent atypical lymphoid infiltrate, see note  Note: The fragments of pleura having underlying population of predominantly small lymphocytes but also percolate into adjacent adipose tissue.  This lymphoid infiltrate is a mix of both B and T cells; however, the B cells do predominate and are the prominent cell that percolates into the fat.  These B cells are CD20/PAX5 positive and are essentially negative for both CD5 and CD10.  CD10/BCL6 do highlight a few small irregular germinal centers that are appropriately negative for Bcl-2 CD43 is appropriately  coexpressed on the T cells and not on the B cells.  Cyclin D1 is negative.  The proliferation rate is appropriately high in the germinal centers but is low less than 5% elsewhere.  Flow cytometry was performed and identified polytypic B cells and no immunophenotypic abnormality in the T cells.  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  Molecular testing for B-cell clonality is pending.  Also clinically indicated the specimen could be sent for a NexGen lymphoid panel.  Clinical correlation recommended.   INTERVAL HISTORY Erin Virginia  [name] is a 78 y.o. female who has above history reviewed by me today presents for follow up visit for recurrent pleural effusion,  S/p pleural catheter removal.  + fatigue. She reports having coughed up blood after the pleural catheter was placed. Catheter was removed in January. Denies hematochezia, hematuria, hematemesis, epistaxis, black tarry stool. She has been taking oral iron  supplemenatation.       MEDICAL HISTORY:  Past Medical History:  Diagnosis Date   A-fib (HCC)    Anemia    Anxiety  Arthritis    Asthma    Cirrhosis (HCC)    COPD (chronic obstructive pulmonary disease) (HCC)    Diabetes mellitus without complication (HCC)    type 2   DVT (deep venous thrombosis) (HCC) 1980   GERD (gastroesophageal reflux disease)    History of hiatal hernia    Hypertension    Hypothyroidism    Lymphoma (HCC)     SURGICAL HISTORY: Past Surgical History:  Procedure Laterality Date   CATARACT EXTRACTION Bilateral    CHEST TUBE INSERTION Right 05/29/2023   Procedure: INSERTION PLEURAL  DRAINAGE CATHETER;  Surgeon: Shyrl Linnie KIDD, MD;  Location: [facility] OR;  Service: Thoracic;  Laterality: Right;   CHOLECYSTECTOMY     COLONOSCOPY     ESOPHAGOGASTRODUODENOSCOPY (EGD) WITH PROPOFOL  N/A 01/17/2022   Procedure: ESOPHAGOGASTRODUODENOSCOPY (EGD) WITH PROPOFOL ;  Surgeon: Onita Elspeth Sharper, DO;  Location: [facility] ENDOSCOPY;  Service: Gastroenterology;  Laterality: N/A;   HUMERUS SURGERY Left    fracture (7 surgeries total)   IR BONE MARROW BIOPSY & ASPIRATION  04/26/2023   LOOP RECORDER INSERTION  Jun 01, 2017   pt states battery died, not transmitting, just there   MENISCUS REPAIR Right 01-Jun-2018   REMOVAL OF PLEURAL DRAINAGE CATHETER Right 07/05/2023   Procedure: REMOVAL OF PLEURAL DRAINAGE CATHETER;  Surgeon: Malka Domino, MD;  Location: [facility] ORS;  Service: Pulmonary;  Laterality: Right;   THYROIDECTOMY     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; reconstruction from hip bone   TONSILECTOMY, ADENOIDECTOMY, BILATERAL MYRINGOTOMY AND TUBES     TOTAL KNEE ARTHROPLASTY Right 03/14/2022   Procedure: TOTAL KNEE ARTHROPLASTY;  Surgeon: Lorelle Hussar, MD;  Location: [facility] ORS;  Service: Orthopedics;  Laterality: Right;   TOTAL SHOULDER REPLACEMENT Left    TUBAL LIGATION     VIDEO ASSISTED THORACOSCOPY Right 05/29/2023   Procedure: RIGHT VIDEO ASSISTED THORACOSCOPY FOR PLEURAL BIOPSY;  Surgeon: Shyrl Linnie KIDD, MD;  Location: [facility] OR;  Service: Thoracic;  Laterality: Right;   WRIST SURGERY Left    multiple fractures from falls (14 surgeries from wrist to shoulder)    SOCIAL HISTORY: Social History   Socioeconomic History   Marital status: Widowed    Spouse name: Not on file   Number of children: 6   Years of education: Not on file   Highest education level: Not on file  Occupational History   Not on file  Tobacco Use   Smoking status: Never    Passive exposure: Past   Smokeless tobacco: Never  Vaping Use   Vaping status: Never Used  Substance and Sexual Activity   Alcohol   use: Never   Drug use: Never   Sexual activity: Not Currently  Other Topics Concern   Not on file  Social History Narrative   Lives in senior housing in [place] (apt[place]   Social Drivers of Health   Financial Resource Strain: Medium Risk (06/20/2023)   Received from [facility]   Overall Financial Resource Strain (CARDIA)    Difficulty of Paying Living Expenses: Somewhat hard  Food Insecurity: Food Insecurity Present (06/20/2023)   Received from [facility]   Hunger Vital Sign    Worried About Running Out of Food in the Last Year: Sometimes true    Ran Out of Food in the Last Year: Sometimes true  Transportation Needs: No Transportation Needs (06/20/2023)   Received from [facility]   PRAPARE - Transportation    Lack of Transportation (Medical): No    Lack of Transportation (Non-Medical):  No  Physical Activity: Not on file  Stress: Not on file  Social Connections: Not on file  Intimate Partner Violence: Not At Risk (05/29/2023)   Humiliation, Afraid, Rape, and Kick questionnaire    Fear of Current or Ex-Partner: No    Emotionally Abused: No    Physically Abused: No    Sexually Abused: No    FAMILY HISTORY: Family History  Problem Relation Age of Onset   Kidney disease Mother    Tuberculosis Father     ALLERGIES:  is allergic to cephalosporins, codeine, contrast media [iodinated contrast media], and oxycodone.  MEDICATIONS:  Current Outpatient Medications  Medication Sig Dispense Refill   acetaminophen  (TYLENOL ) 325 MG tablet Take 650 mg by mouth every 6 (six) hours as needed for moderate pain (pain score 4-6).     acetaminophen  (TYLENOL ) 650 MG CR tablet Take 1,300 mg by mouth in the morning and at bedtime.     ADVAIR HFA 115-21 MCG/ACT inhaler Inhale 2 puffs into the lungs 2 (two) times daily.     albuterol  (VENTOLIN  HFA) 108 (90 Base) MCG/ACT inhaler Inhale 2-4 puffs by mouth every 4 hours as needed for wheezing, cough, and/or shortness of breath (Patient  taking differently: Inhale 2 puffs into the lungs every 4 (four) hours as needed for wheezing or shortness of breath.) 6.7 g 0   ALPRAZolam  (XANAX ) 0.25 MG tablet Take 1 tablet (0.25 mg total) by mouth 2 (two) times daily as needed for anxiety. (Patient taking differently: Take 0.25 mg by mouth as needed for anxiety.) 20 tablet 0   amiodarone  (PACERONE ) 100 MG tablet Take 1 tablet (100 mg total) by mouth daily. 30 tablet 0   Ascorbic Acid  (VITAMIN C  CR) 1500 MG TBCR Take 1 tablet by mouth daily.     BISACODYL  5 MG EC tablet Take 5 mg by mouth daily as needed.     bismuth  subsalicylate (PEPTO BISMOL) 262 MG/15ML suspension Take 30 mLs by mouth every 6 (six) hours as needed for indigestion or diarrhea or loose stools.     busPIRone  (BUSPAR ) 5 MG tablet Take 5 mg by mouth 3 (three) times daily as needed (anxiety).     Carboxymeth-Glycerin-Polysorb (REFRESH OPTIVE ADVANCED) 0.5-1-0.5 % SOLN Apply 2 drops to eye 4 (four) times daily as needed (allergies).     escitalopram  (LEXAPRO ) 10 MG tablet Take 1 tablet (10 mg total) by mouth daily.     esomeprazole (NEXIUM) 40 MG capsule Take 40 mg by mouth 2 (two) times daily.     feeding supplement (ENSURE ENLIVE / ENSURE PLUS) LIQD Take 237 mLs by mouth 3 (three) times daily between meals. (Patient taking differently: Take 237 mLs by mouth daily.)     ferrous sulfate  325 (65 FE) MG tablet Take 325 mg by mouth 3 (three) times a week. Monday, Wednesday, Friday     furosemide  (LASIX ) 40 MG tablet Take 40 mg by mouth daily.     HYDROmorphone  (DILAUDID ) 2 MG tablet Take 2 mg by mouth as needed.     ipratropium-albuterol  (DUONEB) 0.5-2.5 (3) MG/3ML SOLN Take 3 mLs by nebulization every 4 (four) hours as needed (wheezing/SOB).     levothyroxine  (SYNTHROID ) 125 MCG tablet Take 125 mcg by mouth daily before breakfast.     Magnesium  250 MG TABS Take 1 tablet by mouth daily.     Multiple Vitamin (MULTIVITAMIN WITH MINERALS) TABS tablet Take 1 tablet by mouth daily.      nitroGLYCERIN  (NITROSTAT ) 0.4 MG SL tablet Place 0.4  mg under the tongue every 5 (five) minutes as needed for chest pain.     Omega-3 Fatty Acids (FISH OIL PO) Take 1 capsule by mouth daily.     OVER THE COUNTER MEDICATION Take 465 mg by mouth daily. Black seed cumin echinacea     OXYGEN Inhale 2 L into the lungs at bedtime.     PHILLIPS MILK OF MAGNESIA 400 MG/5ML suspension Take 30 mLs by mouth daily as needed for indigestion.     polyethylene glycol (MIRALAX  / GLYCOLAX ) 17 g packet Take 17 g by mouth daily.     potassium chloride  (KLOR-CON ) 10 MEQ tablet Take 10 mEq by mouth daily.     potassium chloride  (KLOR-CON ) 8 MEQ tablet Take 8 mEq by mouth at bedtime.     rOPINIRole  (REQUIP ) 0.25 MG tablet Take 0.25 mg by mouth at bedtime.     senna (SENOKOT) 8.6 MG TABS tablet Take 2 tablets by mouth in the morning and at bedtime.     sodium chloride  (OCEAN) 0.65 % SOLN nasal spray Place 1 spray into both nostrils as needed for congestion.  0   SYSTANE ULTRA 0.4-0.3 % SOLN Apply to eye.     traMADol  (ULTRAM ) 50 MG tablet Up to one every 4 hours as needed 30 tablet 0   traZODone  (DESYREL ) 100 MG tablet Take 100 mg by mouth at bedtime.     TURMERIC PO Take 1,900 mg by mouth daily.     Turmeric, Curcuma [place]a, POWD Take 1 tablet by mouth daily.     VITAMIN D -VITAMIN K  PO Take 1 capsule by mouth at bedtime.     No current facility-administered medications for this visit.    Review of Systems  Constitutional:  Positive for fatigue. Negative for appetite change, chills and fever.  HENT:   Negative for hearing loss and voice change.   Eyes:  Negative for eye problems.  Respiratory:  Positive for shortness of breath. Negative for chest tightness and cough.   Cardiovascular:  Negative for chest pain.  Gastrointestinal:  Negative for abdominal distention, abdominal pain and blood in stool.  Endocrine: Negative for hot flashes.  Genitourinary:  Negative for difficulty urinating and frequency.    Musculoskeletal:  Negative for arthralgias.  Skin:  Negative for itching and rash.  Neurological:  Negative for extremity weakness.  Hematological:  Negative for adenopathy.  Psychiatric/Behavioral:  Positive for depression. Negative for confusion. The patient is nervous/anxious.      PHYSICAL EXAMINATION: ECOG PERFORMANCE STATUS: 1 - Symptomatic but completely ambulatory  Vitals:   07/13/23 1043  BP: 135/68  Pulse: 76  Resp: 18  Temp: 98.4 F (36.9 C)  SpO2: 99%   Filed Weights   07/13/23 1043  Weight: 142 lb 3.2 oz (64.5 kg)    Physical Exam Constitutional:      General: She is not in acute distress. HENT:     Head: Normocephalic and atraumatic.  Eyes:     General: No scleral icterus. Cardiovascular:     Rate and Rhythm: Normal rate and regular rhythm.  Pulmonary:     Effort: Pulmonary effort is normal. No respiratory distress.     Comments: Decreased breath sound right lower lobe Abdominal:     General: There is no distension.     Palpations: Abdomen is soft.     Tenderness: There is no abdominal tenderness.  Musculoskeletal:        General: Normal range of motion.     Cervical back: Normal range  of motion and neck supple.  Skin:    Findings: No erythema.  Neurological:     Mental Status: She is alert and oriented to person, place, and time. Mental status is at baseline.     Motor: No abnormal muscle tone.  Psychiatric:        Mood and Affect: Affect normal.      LABORATORY DATA:  I have reviewed the data as listed    Latest Ref Rng & Units 07/13/2023   10:28 AM 07/10/2023    4:29 PM 05/30/2023    5:06 AM  CBC  WBC 4.0 - 10.5 K/uL 7.0  7.1  10.4   Hemoglobin 12.0 - 15.0 g/dL 7.4  6.5  8.6   Hematocrit 36.0 - 46.0 % 24.9  22.5  27.9   Platelets 150 - 400 K/uL 192  237  181       Latest Ref Rng & Units 07/10/2023    4:29 PM 05/30/2023    4:23 AM 05/26/2023   11:30 AM  CMP  Glucose 70 - 99 mg/dL 875  859  95   BUN 8 - 23 mg/dL 19  20  13     Creatinine 0.44 - 1.00 mg/dL 9.34  9.11  9.37   Sodium 135 - 145 mmol/L 136  136  137   Potassium 3.5 - 5.1 mmol/L 4.2  4.4  3.9   Chloride 98 - 111 mmol/L 103  103  106   CO2 22 - 32 mmol/L 20  29  21    Calcium  8.9 - 10.3 mg/dL 9.0  8.6  8.8   Total Protein 6.5 - 8.1 g/dL   7.3   Total Bilirubin <1.2 mg/dL   0.9   Alkaline Phos 38 - 126 U/L   46   AST 15 - 41 U/L   22   ALT 0 - 44 U/L   13      RADIOGRAPHIC STUDIES: I have personally reviewed the radiological images as listed and agreed with the findings in the report. DG Chest Portable 1 View Result Date: 07/10/2023 CLINICAL DATA:  weakness, eval for effusion EXAM: PORTABLE CHEST 1 VIEW COMPARISON:  Chest x-ray 07/05/2023, CT chest 04/14/2023, chest x-ray 06/15/2023 FINDINGS: Wireless left chest wall cardiac device. The heart and mediastinal contours are unchanged. Atherosclerotic plaque. Slightly more conspicuous developing right mid lung zone airspace opacity. Chronic coarsened interstitial markings with no overt pulmonary edema. Persistent trace small volume right loculated pleural effusion. No left pleural effusion. No pneumothorax. No acute osseous abnormality. Total left shoulder reverse arthroplasty. Chronic old healed nondisplaced left rib fractures. IMPRESSION: 1. Slightly more conspicuous developing right mid lung zone airspace opacity. 2. Persistent trace small volume right loculated pleural effusion. 3.  Aortic Atherosclerosis (ICD10-I70.0). Electronically Signed   By: Morgane  Naveau M.D.   On: 07/10/2023 21:40   DG Chest Port 1 View Result Date: 07/05/2023 CLINICAL DATA:  Preop EXAM: PORTABLE CHEST 1 VIEW COMPARISON:  X-ray 06/15/2023. FINDINGS: Previous right PleurX catheter no longer seen. Small right effusion. There is some density along the minor fissure which could be fluid. Hyperinflation with some chronic interstitial changes. No consolidation. No pneumothorax. Stable cardiopericardial silhouette with calcified aorta.  Overlapping loop recorder. Old left-sided rib fractures. Reverse left shoulder arthroplasty. IMPRESSION: Right-sided PleurX catheter no longer seen. Persistent small right effusion as well as some fluid tracking along the minor fissure. Hyperinflation with chronic changes.  Loop recorder. Electronically Signed   By: Ranell Bring M.D.   On: 07/05/2023  17:11   DG Chest 2 View Result Date: 06/19/2023 CLINICAL DATA:  78 year old female with a history of pleural effusion EXAM: CHEST - 2 VIEW COMPARISON:  05/30/2023 FINDINGS: Cardiomediastinal silhouette unchanged in size and contour. No evidence of central vascular congestion. No interlobular septal thickening. Unchanged event recorder on the left chest wall. Unchanged right-sided tunneled pleural drainage catheter. Trace fluid at the minor fissure. Trace fluid at the right lung base blunting the costophrenic angle. Coarsened interstitial markings of the lungs similar to the prior. No new airspace disease. No pneumothorax. Interval removal of right IJ central venous catheter No acute displaced fracture. Degenerative changes of the spine. Surgical changes of the left glenohumeral joint IMPRESSION: Unchanged right tunneled pleural drainage catheter with trace pleural fluid at the right lung base. Interval removal of right IJ central venous catheter. Electronically Signed   By: Ami Bellman D.O.   On: 06/19/2023 16:24

## 2023-07-13 NOTE — Assessment & Plan Note (Signed)
 Refer to GI

## 2023-07-13 NOTE — Assessment & Plan Note (Addendum)
 Labs are reviewed and discussed with patient. Lab Results  Component Value Date   HGB 7.4 (L) 07/13/2023   TIBC 564 (H) 07/10/2023   IRONPCTSAT 3 (L) 07/10/2023   FERRITIN 61 07/10/2023    She developed IDA despite taking oral iron  supplementation.  I discussed about option of IV Venofer  treatments. I discussed about the potential risks including but not limited to allergic reactions/infusion reactions including anaphylactic reactions, phlebitis, high blood pressure, wheezing, SOB, skin rash, weight gain,dark urine, leg swelling, back pain, headache, nausea and fatigue, etc. Patient agrees with the plan. Plan IV venofer  weekly x 3  Etiology of IDA, hemopysis vs GI blood loss. She is on Elquis for Afib

## 2023-07-13 NOTE — Assessment & Plan Note (Signed)
 Encourage oral hydration and avoid nephrotoxins.

## 2023-07-13 NOTE — Assessment & Plan Note (Addendum)
 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 Bone marrow negative for lymphoma involvement.  Peripheral blood flow cytometry is negative. 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  second opinion at [facility] reviewed.   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 However, regardless of the exact histology,  would recommend similar management of an indolent, small volume B cell lymphoma resulting in recurrent effusions. Should patient experiences recurrent fluid accumulation, would recommend treatment with a course of Rituximab monotherapy   Pleural biopsy pathology review : While a B-cell predominance raises the possibility of a low-grade lymphoma, flow cytometry was negative for a clonal population and there is no overt immunophenotypic aberrancy.  The current findings are not diagnostic of lymphoma and pending outside DNA studies (B-cell clonality) should help to further exclude lymphoid malignancy.  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   B-cell gene arrangement is negative. Recommend observation for now. Status post pleural catheter,removal.

## 2023-07-14 ENCOUNTER — Ambulatory Visit (INDEPENDENT_AMBULATORY_CARE_PROVIDER_SITE_OTHER): Payer: Self-pay | Admitting: Thoracic Surgery (Cardiothoracic Vascular Surgery)

## 2023-07-14 ENCOUNTER — Other Ambulatory Visit: Payer: Self-pay | Admitting: Nurse Practitioner

## 2023-07-14 ENCOUNTER — Inpatient Hospital Stay: Payer: Medicare (Managed Care)

## 2023-07-14 VITALS — BP 105/50 | HR 70 | Temp 98.2°F | Resp 17

## 2023-07-14 DIAGNOSIS — C8590 Non-Hodgkin lymphoma, unspecified, unspecified site: Secondary | ICD-10-CM

## 2023-07-14 DIAGNOSIS — D509 Iron deficiency anemia, unspecified: Secondary | ICD-10-CM | POA: Diagnosis not present

## 2023-07-14 DIAGNOSIS — C8599 Non-Hodgkin lymphoma, unspecified, extranodal and solid organ sites: Secondary | ICD-10-CM

## 2023-07-14 MED ORDER — IRON SUCROSE 20 MG/ML IV SOLN
200.0000 mg | Freq: Once | INTRAVENOUS | Status: AC
  Administered 2023-07-14: 200 mg via INTRAVENOUS

## 2023-07-14 NOTE — Progress Notes (Signed)
 Orders for venofer  placed on behalf of Dr Wilhelmenia Harada

## 2023-07-14 NOTE — Progress Notes (Signed)
     [address]       [place] [address]             [phone]       Patient: Home Provider: Office Consent for Telemedicine visit obtained.  Today's visit was completed via a real-time telehealth (see specific modality noted below). The patient/authorized person provided oral consent at the time of the visit to engage in a telemedicine encounter with the present provider at [facility]. The patient/authorized person was informed of the potential benefits, limitations, and risks of telemedicine. The patient/authorized person expressed understanding that the laws that protect confidentiality also apply to telemedicine. The patient/authorized person acknowledged understanding that telemedicine does not provide emergency services and that he or she would need to call 911 or proceed to the nearest hospital for help if such a need arose.   Total time spent in the clinical discussion 10 minutes.  Telehealth Modality: Phone visit (audio only)  I had a telephone visit with Mrs. Lantier.  The pleurx catheter was removed.  She recently received a blood transfusion and iron  transfusion.  She will not require chemotherapy.  Her breathing has been stable.  She will follow-up as needed.  Taquila Leys MALVA Rayas

## 2023-07-21 ENCOUNTER — Inpatient Hospital Stay: Payer: Medicare (Managed Care)

## 2023-07-21 VITALS — BP 130/68 | HR 84 | Temp 98.1°F | Resp 18

## 2023-07-21 DIAGNOSIS — D509 Iron deficiency anemia, unspecified: Secondary | ICD-10-CM | POA: Diagnosis not present

## 2023-07-21 DIAGNOSIS — C8599 Non-Hodgkin lymphoma, unspecified, extranodal and solid organ sites: Secondary | ICD-10-CM

## 2023-07-21 MED ORDER — IRON SUCROSE 20 MG/ML IV SOLN
200.0000 mg | Freq: Once | INTRAVENOUS | Status: AC
  Administered 2023-07-21: 200 mg via INTRAVENOUS

## 2023-07-21 NOTE — Patient Instructions (Signed)

## 2023-07-28 ENCOUNTER — Inpatient Hospital Stay: Payer: Medicare (Managed Care)

## 2023-07-28 ENCOUNTER — Ambulatory Visit: Admit: 2023-07-28 | Discharge: 2023-07-28 | Payer: MEDICARE | Attending: Audiologist

## 2023-07-28 VITALS — BP 129/56 | HR 67 | Temp 98.6°F

## 2023-07-28 DIAGNOSIS — C8599 Non-Hodgkin lymphoma, unspecified, extranodal and solid organ sites: Secondary | ICD-10-CM

## 2023-07-28 DIAGNOSIS — D509 Iron deficiency anemia, unspecified: Secondary | ICD-10-CM | POA: Diagnosis not present

## 2023-07-28 DIAGNOSIS — H903 Sensorineural hearing loss, bilateral: Secondary | ICD-10-CM

## 2023-07-28 MED ORDER — IRON SUCROSE 20 MG/ML IV SOLN
200.0000 mg | Freq: Once | INTRAVENOUS | Status: AC
  Administered 2023-07-28: 200 mg via INTRAVENOUS
  Filled 2023-07-28: qty 10

## 2023-07-28 NOTE — Progress Notes (Signed)
 Hearing Aid Chart Note:     Audiologist:  Lewie Chamber  Fitting Date:  07/28/23                Aid Make and Model # :     Right:  Deno Etienne I 70-R  Serial #:  [id number]    Left:  Barnabas Lister  Serial #:  [id number]    Warranty Expiration Date:  Repair:  07/27/26  Warranty Expiration date:  L&D:  07/27/26    Battery Size:  rechargeable  Tubing Length:  size 1 M  Dome/Tip Size: small power  Accessories: Consulting civil engineer [id number]  Accessories Warranty Date: 07/27/24    Chart Notes:     HAF: Au Phonak Audeo I 70-R  Audiogram direct completed  Fit to 100% of target  Reviewed use and care   -disabled VC and programs   -will review wax filters at follow-up   Counseled pt regarding HA expectations and listening strategies (noise)  Will review streaming and app use at future appointment  Pt to follow-up in 3 weeks

## 2023-07-31 ENCOUNTER — Ambulatory Visit: Payer: Medicare (Managed Care) | Admitting: Oncology

## 2023-08-10 ENCOUNTER — Ambulatory Visit: Payer: PRIVATE HEALTH INSURANCE

## 2023-08-22 ENCOUNTER — Other Ambulatory Visit: Payer: Self-pay

## 2023-08-22 ENCOUNTER — Inpatient Hospital Stay: Payer: Medicare (Managed Care) | Attending: Oncology

## 2023-08-22 DIAGNOSIS — R5383 Other fatigue: Secondary | ICD-10-CM | POA: Insufficient documentation

## 2023-08-22 DIAGNOSIS — J90 Pleural effusion, not elsewhere classified: Secondary | ICD-10-CM | POA: Diagnosis not present

## 2023-08-22 DIAGNOSIS — D509 Iron deficiency anemia, unspecified: Secondary | ICD-10-CM | POA: Diagnosis present

## 2023-08-22 DIAGNOSIS — C8599 Non-Hodgkin lymphoma, unspecified, extranodal and solid organ sites: Secondary | ICD-10-CM

## 2023-08-22 LAB — CBC WITH DIFFERENTIAL (CANCER CENTER ONLY)
Abs Immature Granulocytes: 0.02 10*3/uL (ref 0.00–0.07)
Basophils Absolute: 0 10*3/uL (ref 0.0–0.1)
Basophils Relative: 1 %
Eosinophils Absolute: 0.1 10*3/uL (ref 0.0–0.5)
Eosinophils Relative: 3 %
HCT: 32.5 % — ABNORMAL LOW (ref 36.0–46.0)
Hemoglobin: 9.5 g/dL — ABNORMAL LOW (ref 12.0–15.0)
Immature Granulocytes: 0 %
Lymphocytes Relative: 25 %
Lymphs Abs: 1.4 10*3/uL (ref 0.7–4.0)
MCH: 23.6 pg — ABNORMAL LOW (ref 26.0–34.0)
MCHC: 29.2 g/dL — ABNORMAL LOW (ref 30.0–36.0)
MCV: 80.6 fL (ref 80.0–100.0)
Monocytes Absolute: 0.3 10*3/uL (ref 0.1–1.0)
Monocytes Relative: 6 %
Neutro Abs: 3.6 10*3/uL (ref 1.7–7.7)
Neutrophils Relative %: 65 %
Platelet Count: 143 10*3/uL — ABNORMAL LOW (ref 150–400)
RBC: 4.03 MIL/uL (ref 3.87–5.11)
RDW: 19.2 % — ABNORMAL HIGH (ref 11.5–15.5)
WBC Count: 5.6 10*3/uL (ref 4.0–10.5)
nRBC: 0 % (ref 0.0–0.2)

## 2023-08-22 LAB — IRON AND TIBC
Iron: 34 ug/dL (ref 28–170)
Saturation Ratios: 7 % — ABNORMAL LOW (ref 10.4–31.8)
TIBC: 468 ug/dL — ABNORMAL HIGH (ref 250–450)
UIBC: 434 ug/dL

## 2023-08-22 LAB — RETIC PANEL
Immature Retic Fract: 17.2 % — ABNORMAL HIGH (ref 2.3–15.9)
RBC.: 3.96 MIL/uL (ref 3.87–5.11)
Retic Count, Absolute: 51.5 10*3/uL (ref 19.0–186.0)
Retic Ct Pct: 1.3 % (ref 0.4–3.1)
Reticulocyte Hemoglobin: 22.5 pg — ABNORMAL LOW (ref >27.9)

## 2023-08-22 LAB — VITAMIN B12: Vitamin B-12: 432 pg/mL (ref 180–914)

## 2023-08-22 LAB — FERRITIN: Ferritin: 57 ng/mL (ref 11–307)

## 2023-08-22 NOTE — Telephone Encounter (Signed)
 Called pt to cancel and reschedule appt on 08-22-23 with [facility] due to schedule change. LVM with call back number.

## 2023-08-23 ENCOUNTER — Ambulatory Visit: Payer: MEDICARE | Attending: Audiologist

## 2023-08-24 ENCOUNTER — Inpatient Hospital Stay (HOSPITAL_BASED_OUTPATIENT_CLINIC_OR_DEPARTMENT_OTHER): Payer: Medicare (Managed Care) | Admitting: Oncology

## 2023-08-24 ENCOUNTER — Encounter: Payer: Self-pay | Admitting: Oncology

## 2023-08-24 ENCOUNTER — Inpatient Hospital Stay: Payer: Medicare (Managed Care)

## 2023-08-24 ENCOUNTER — Encounter: Admit: 2023-08-24 | Discharge: 2023-08-24 | Payer: MEDICARE

## 2023-08-24 VITALS — BP 146/64 | HR 66 | Temp 97.3°F | Resp 18 | Wt 146.7 lb

## 2023-08-24 VITALS — BP 142/64 | HR 61

## 2023-08-24 DIAGNOSIS — K7460 Unspecified cirrhosis of liver: Secondary | ICD-10-CM

## 2023-08-24 DIAGNOSIS — D509 Iron deficiency anemia, unspecified: Secondary | ICD-10-CM

## 2023-08-24 DIAGNOSIS — N183 Chronic kidney disease, stage 3 unspecified: Secondary | ICD-10-CM

## 2023-08-24 DIAGNOSIS — J90 Pleural effusion, not elsewhere classified: Secondary | ICD-10-CM | POA: Diagnosis not present

## 2023-08-24 DIAGNOSIS — C8599 Non-Hodgkin lymphoma, unspecified, extranodal and solid organ sites: Secondary | ICD-10-CM

## 2023-08-24 DIAGNOSIS — E1122 Type 2 diabetes mellitus with diabetic chronic kidney disease: Secondary | ICD-10-CM | POA: Diagnosis not present

## 2023-08-24 DIAGNOSIS — K5902 Outlet dysfunction constipation: Secondary | ICD-10-CM

## 2023-08-24 MED ORDER — IRON SUCROSE 20 MG/ML IV SOLN
200.0000 mg | Freq: Once | INTRAVENOUS | Status: AC
  Administered 2023-08-24: 200 mg via INTRAVENOUS
  Filled 2023-08-24: qty 10

## 2023-08-24 MED ORDER — SODIUM CHLORIDE 0.9% FLUSH
10.0000 mL | Freq: Once | INTRAVENOUS | Status: AC
  Administered 2023-08-24: 10 mL via INTRAVENOUS
  Filled 2023-08-24: qty 10

## 2023-08-24 NOTE — Assessment & Plan Note (Addendum)
 Labs are reviewed and discussed with patient. Lab Results  Component Value Date   HGB 9.5 (L) 08/22/2023   TIBC 468 (H) 08/22/2023   IRONPCTSAT 7 (L) 08/22/2023   FERRITIN 57 08/22/2023    Recommend additional IV venofer weekly x 3  Etiology of IDA, suspect GI blood loss

## 2023-08-24 NOTE — Progress Notes (Signed)
 Hematology/Oncology Progress note [phone] [phone]      CHIEF COMPLAINTS/PURPOSE OF CONSULTATION:  Pleural effusion, monoclonal lymphocytosis, IDA   ASSESSMENT & PLAN:   Iron deficiency anemia Labs are reviewed and discussed with patient. Lab Results  Component Value Date   HGB 9.5 (L) 08/22/2023   TIBC 468 (H) 08/22/2023   IRONPCTSAT 7 (L) 08/22/2023   FERRITIN 57 08/22/2023    Recommend additional IV venofer weekly x 3  Etiology of IDA, suspect GI blood loss  Pleural effusion on right 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 Bone marrow negative for lymphoma involvement.  Peripheral blood flow cytometry is negative. 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  second opinion at [facility] reviewed.  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 However, regardless of the exact histology,  would recommend similar management of an indolent, small volume B cell lymphoma resulting in recurrent effusions. Should patient experiences recurrent fluid accumulation, would recommend treatment with a course of Rituximab monotherapy   Pleural biopsy pathology review : While a B-cell predominance raises the possibility of a low-grade lymphoma, flow cytometry was negative for a clonal population and there is no overt immunophenotypic aberrancy.  The current findings are not diagnostic of lymphoma  and pending outside DNA studies (B-cell clonality) should help to further exclude lymphoid malignancy.  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   B-cell gene arrangement is negative. Breathing status is stable.  Recommend observation for now. Status post pleural catheter,removal.      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     Orders Placed This Encounter  Procedures   CBC with Differential (Cancer Center Only)    Standing Status:   Future    Expected Date:   11/24/2023    Expiration Date:   08/23/2024   Ferritin    Standing Status:   Future    Expected Date:   11/24/2023    Expiration Date:   08/23/2024   Iron and TIBC    Standing Status:   Future    Expected Date:   11/24/2023    Expiration Date:   08/23/2024   Retic Panel    Standing Status:   Future    Expected Date:   11/24/2023    Expiration Date:   08/23/2024   Follow-up 3 months.  All questions were answered. The patient knows to call the clinic with any problems, questions or concerns.  Rickard Patience, MD, PhD [facility] Hematology Oncology 08/24/2023    HISTORY OF PRESENTING ILLNESS:  [name] 78 y.o. female presents to establish care for pleural effusion, unintentional weight loss.   She was recently hospitalized due to dyspnea and chest tightness.  04/07/2023  CT scan revealed significant pleural effusion, which was subsequently drained. Flow cytometry of the fluid indicated potential lymphoma cells. The patient has  been experiencing inconsistent breathing, with noticeable shortness of breath upon exertion, a significant change from her previous ability to walk for a mile multiple times a week. These symptoms began approximately two weeks prior to the consultation.  The patient's appetite has  been notably decreased, with consumption of only one substantial meal per day. This has resulted in a weight loss of over twenty pounds in the past few months. The patient reports some sweating, not on a daily basis, for the past year.  The patient also reports a history of shoulder injury due to a fall down a flight of stairs in the 1990s, which required multiple surgeries. She currently manages the pain with Tramadol. The patient lives alone and has been experiencing difficulty with mobility due to her current health condition.  05/29/23  Pleura, flow cytometry:  -  No immunophenotypic evidence of a lymphoproliferative sorter (i.e. no  monoclonal B cells or immunophenotypically abnormal T cells detected.   A. PLEURA, BIOPSY #1: -  Pleura with prominent atypical lymphoid infiltrate, see note.  B. PLEURA, BIOPSY#2: -  Pleural with prominent atypical lymphoid infiltrate, see note  Note: The fragments of pleura having underlying population of predominantly small lymphocytes but also percolate into adjacent adipose tissue.  This lymphoid infiltrate is a mix of both B and T cells; however, the B cells do predominate and are the prominent cell that percolates into the fat.  These B cells are CD20/PAX5 positive and are essentially negative for both CD5 and CD10.  CD10/BCL6 do highlight a few small irregular germinal centers that are appropriately negative for Bcl-2 CD43 is appropriately coexpressed on the T cells and not on the B cells.  Cyclin D1 is negative.  The proliferation rate is appropriately high in the germinal centers but is low less than 5% elsewhere.  Flow cytometry was performed and identified polytypic B cells and no immunophenotypic abnormality in the T cells.  Given the lack of established clonality, the diagnosis of a lymphoproliferative disorder cannot be made; however, the prominent B-cell population as well as infiltration into adjacent fat is still concerning for  possible low-grade B-cell lymphoma given the presence of clonality in the peripheral blood.  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  Molecular testing for B-cell clonality is pending.  Also clinically indicated the specimen could be sent for a NexGen lymphoid panel.  Clinical correlation recommended.   INTERVAL HISTORY [name] is a 78 y.o. female who has above history reviewed by me today presents for follow up visit for recurrent pleural effusion,  S/p pleural catheter removal.  + fatigue has improved. No SOB Denies hematochezia, hematuria, hematemesis, epistaxis, black tarry stool.  Intermittent right chest wall sharp pain at the sites of previous pleural /chest tube. She has been off Eliquis currently.     MEDICAL HISTORY:  Past Medical History:  Diagnosis Date   A-fib (HCC)    Anemia    Anxiety    Arthritis    Asthma    Cirrhosis (HCC)    COPD (chronic obstructive pulmonary disease) (HCC)    Diabetes mellitus without complication (HCC)    type 2   DVT (deep venous thrombosis) (HCC) 1980   GERD (gastroesophageal reflux disease)    History of hiatal hernia    Hypertension    Hypothyroidism    Lymphoma (HCC)     SURGICAL HISTORY: Past Surgical History:  Procedure Laterality Date   CATARACT EXTRACTION Bilateral  CHEST TUBE INSERTION Right 05/29/2023   Procedure: INSERTION PLEURAL DRAINAGE CATHETER;  Surgeon: Corliss Skains, MD;  Location: [facility] OR;  Service: Thoracic;  Laterality: Right;   CHOLECYSTECTOMY     COLONOSCOPY     ESOPHAGOGASTRODUODENOSCOPY (EGD) WITH PROPOFOL N/A 01/17/2022   Procedure: ESOPHAGOGASTRODUODENOSCOPY (EGD) WITH PROPOFOL;  Surgeon: Jaynie Collins, DO;  Location: [facility] ENDOSCOPY;  Service: Gastroenterology;  Laterality: N/A;   HUMERUS SURGERY Left    fracture (7 surgeries total)   IR BONE MARROW BIOPSY & ASPIRATION  04/26/2023   LOOP RECORDER INSERTION  05-Sep-2016   pt  states battery died, not transmitting, just there   MENISCUS REPAIR Right Sep 05, 2017   REMOVAL OF PLEURAL DRAINAGE CATHETER Right 07/05/2023   Procedure: REMOVAL OF PLEURAL DRAINAGE CATHETER;  Surgeon: Janann Colonel, MD;  Location: [facility] ORS;  Service: Pulmonary;  Laterality: Right;   THYROIDECTOMY     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; reconstruction from hip bone   TONSILECTOMY, ADENOIDECTOMY, BILATERAL MYRINGOTOMY AND TUBES     TOTAL KNEE ARTHROPLASTY Right 03/14/2022   Procedure: TOTAL KNEE ARTHROPLASTY;  Surgeon: Reinaldo Berber, MD;  Location: [facility] ORS;  Service: Orthopedics;  Laterality: Right;   TOTAL SHOULDER REPLACEMENT Left    TUBAL LIGATION     VIDEO ASSISTED THORACOSCOPY Right 05/29/2023   Procedure: RIGHT VIDEO ASSISTED THORACOSCOPY FOR PLEURAL BIOPSY;  Surgeon: Corliss Skains, MD;  Location: [facility] OR;  Service: Thoracic;  Laterality: Right;   WRIST SURGERY Left    multiple fractures from falls (14 surgeries from wrist to shoulder)    SOCIAL HISTORY: Social History   Socioeconomic History   Marital status: Widowed    Spouse name: Not on file   Number of children: 6   Years of education: Not on file   Highest education level: Not on file  Occupational History   Not on file  Tobacco Use   Smoking status: Never    Passive exposure: Past   Smokeless tobacco: Never  Vaping Use   Vaping status: Never Used  Substance and Sexual Activity   Alcohol use: Never   Drug use: Never   Sexual activity: Not Currently  Other Topics Concern   Not on file  Social History Narrative   Lives in senior housing in [place] (apt.)   Social Drivers of Health   Financial Resource Strain: Medium Risk (06/20/2023)   Received from [facility]   Overall Financial Resource Strain (CARDIA)    Difficulty of Paying Living Expenses: Somewhat hard  Food Insecurity: Food Insecurity Present (06/20/2023)   Received from [facility]   Hunger Vital Sign    Worried About Running Out of  Food in the Last Year: Sometimes true    Ran Out of Food in the Last Year: Sometimes true  Transportation Needs: No Transportation Needs (06/20/2023)   Received from [facility]   PRAPARE - Transportation    Lack of Transportation (Medical): No    Lack of Transportation (Non-Medical): No  Physical Activity: Not on file  Stress: Not on file  Social Connections: Not on file  Intimate Partner Violence: Not At Risk (05/29/2023)   Humiliation, Afraid, Rape, and Kick questionnaire    Fear of Current or Ex-Partner: No    Emotionally Abused: No    Physically Abused: No    Sexually Abused: No    FAMILY HISTORY: Family History  Problem Relation Age of Onset   Kidney disease Mother    Tuberculosis Father  ALLERGIES:  is allergic to cephalosporins, codeine, contrast media [iodinated contrast media], and oxycodone.  MEDICATIONS:  Current Outpatient Medications  Medication Sig Dispense Refill   acetaminophen (TYLENOL) 325 MG tablet Take 650 mg by mouth every 6 (six) hours as needed for moderate pain (pain score 4-6).     acetaminophen (TYLENOL) 650 MG CR tablet Take 1,300 mg by mouth in the morning and at bedtime.     ADVAIR HFA 115-21 MCG/ACT inhaler Inhale 2 puffs into the lungs 2 (two) times daily.     albuterol (VENTOLIN HFA) 108 (90 Base) MCG/ACT inhaler Inhale 2-4 puffs by mouth every 4 hours as needed for wheezing, cough, and/or shortness of breath (Patient taking differently: Inhale 2 puffs into the lungs every 4 (four) hours as needed for wheezing or shortness of breath.) 6.7 g 0   ALPRAZolam (XANAX) 0.25 MG tablet Take 1 tablet (0.25 mg total) by mouth 2 (two) times daily as needed for anxiety. (Patient taking differently: Take 0.25 mg by mouth as needed for anxiety.) 20 tablet 0   amiodarone (PACERONE) 100 MG tablet Take 1 tablet (100 mg total) by mouth daily. 30 tablet 0   Ascorbic Acid (VITAMIN C CR) 1500 MG TBCR Take 1 tablet by mouth daily.     BISACODYL 5 MG EC tablet  Take 5 mg by mouth daily as needed.     bismuth subsalicylate (PEPTO BISMOL) 262 MG/15ML suspension Take 30 mLs by mouth every 6 (six) hours as needed for indigestion or diarrhea or loose stools.     busPIRone (BUSPAR) 5 MG tablet Take 5 mg by mouth 3 (three) times daily as needed (anxiety).     Carboxymeth-Glycerin-Polysorb (REFRESH OPTIVE ADVANCED) 0.5-1-0.5 % SOLN Apply 2 drops to eye 4 (four) times daily as needed (allergies).     escitalopram (LEXAPRO) 10 MG tablet Take 1 tablet (10 mg total) by mouth daily.     esomeprazole (NEXIUM) 40 MG capsule Take 40 mg by mouth 2 (two) times daily.     feeding supplement (ENSURE ENLIVE / ENSURE PLUS) LIQD Take 237 mLs by mouth 3 (three) times daily between meals. (Patient taking differently: Take 237 mLs by mouth daily.)     ferrous sulfate 325 (65 FE) MG tablet Take 325 mg by mouth 3 (three) times a week. Monday, Wednesday, Friday     furosemide (LASIX) 40 MG tablet Take 40 mg by mouth daily.     HYDROmorphone (DILAUDID) 2 MG tablet Take 2 mg by mouth as needed.     ipratropium-albuterol (DUONEB) 0.5-2.5 (3) MG/3ML SOLN Take 3 mLs by nebulization every 4 (four) hours as needed (wheezing/SOB).     levothyroxine (SYNTHROID) 125 MCG tablet Take 125 mcg by mouth daily before breakfast.     Magnesium 250 MG TABS Take 1 tablet by mouth daily.     Multiple Vitamin (MULTIVITAMIN WITH MINERALS) TABS tablet Take 1 tablet by mouth daily.     nitroGLYCERIN (NITROSTAT) 0.4 MG SL tablet Place 0.4 mg under the tongue every 5 (five) minutes as needed for chest pain.     Omega-3 Fatty Acids (FISH OIL PO) Take 1 capsule by mouth daily.     OVER THE COUNTER MEDICATION Take 465 mg by mouth daily. Black seed cumin echinacea     OXYGEN Inhale 2 L into the lungs at bedtime.     PHILLIPS MILK OF MAGNESIA 400 MG/5ML suspension Take 30 mLs by mouth daily as needed for indigestion.     polyethylene glycol (MIRALAX /  GLYCOLAX) 17 g packet Take 17 g by mouth daily.     potassium  chloride (KLOR-CON) 10 MEQ tablet Take 10 mEq by mouth daily.     potassium chloride (KLOR-CON) 8 MEQ tablet Take 8 mEq by mouth at bedtime.     rOPINIRole (REQUIP) 0.25 MG tablet Take 0.25 mg by mouth at bedtime.     senna (SENOKOT) 8.6 MG TABS tablet Take 2 tablets by mouth in the morning and at bedtime.     sodium chloride (OCEAN) 0.65 % SOLN nasal spray Place 1 spray into both nostrils as needed for congestion.  0   SYSTANE ULTRA 0.4-0.3 % SOLN Apply to eye.     traMADol (ULTRAM) 50 MG tablet Up to one every 4 hours as needed 30 tablet 0   traZODone (DESYREL) 100 MG tablet Take 100 mg by mouth at bedtime.     TURMERIC PO Take 1,900 mg by mouth daily.     Turmeric, Curcuma Longa, POWD Take 1 tablet by mouth daily.     VITAMIN D-VITAMIN K PO Take 1 capsule by mouth at bedtime.     No current facility-administered medications for this visit.    Review of Systems  Constitutional:  Positive for fatigue. Negative for appetite change, chills and fever.  HENT:   Negative for hearing loss and voice change.   Eyes:  Negative for eye problems.  Respiratory:  Negative for chest tightness, cough and shortness of breath.   Cardiovascular:  Negative for chest pain.  Gastrointestinal:  Negative for abdominal distention, abdominal pain and blood in stool.  Endocrine: Negative for hot flashes.  Genitourinary:  Negative for difficulty urinating and frequency.   Musculoskeletal:  Negative for arthralgias.  Skin:  Negative for itching and rash.  Neurological:  Negative for extremity weakness.  Hematological:  Negative for adenopathy.  Psychiatric/Behavioral:  Positive for depression. Negative for confusion. The patient is nervous/anxious.      PHYSICAL EXAMINATION: ECOG PERFORMANCE STATUS: 1 - Symptomatic but completely ambulatory  Vitals:   08/24/23 1338  BP: (!) 146/64  Pulse: 66  Resp: 18  Temp: (!) 97.3 F (36.3 C)  SpO2: 96%   Filed Weights   08/24/23 1338  Weight: 146 lb 11.2 oz  (66.5 kg)    Physical Exam Constitutional:      General: She is not in acute distress. HENT:     Head: Normocephalic and atraumatic.  Eyes:     General: No scleral icterus. Cardiovascular:     Rate and Rhythm: Normal rate and regular rhythm.  Pulmonary:     Effort: Pulmonary effort is normal. No respiratory distress.     Comments: Decreased breath sound right lower lobe Abdominal:     General: There is no distension.     Palpations: Abdomen is soft.     Tenderness: There is no abdominal tenderness.  Musculoskeletal:        General: Normal range of motion.     Cervical back: Normal range of motion and neck supple.  Skin:    Findings: No erythema.  Neurological:     Mental Status: She is alert and oriented to person, place, and time. Mental status is at baseline.     Motor: No abnormal muscle tone.  Psychiatric:        Mood and Affect: Affect normal.      LABORATORY DATA:  I have reviewed the data as listed    Latest Ref Rng & Units 08/22/2023   11:24 AM 07/13/2023  10:28 AM 07/10/2023    4:29 PM  CBC  WBC 4.0 - 10.5 K/uL 5.6  7.0  7.1   Hemoglobin 12.0 - 15.0 g/dL 9.5  7.4  6.5   Hematocrit 36.0 - 46.0 % 32.5  24.9  22.5   Platelets 150 - 400 K/uL 143  192  237       Latest Ref Rng & Units 07/10/2023    4:29 PM 05/30/2023    4:23 AM 05/26/2023   11:30 AM  CMP  Glucose 70 - 99 mg/dL 956  213  95   BUN 8 - 23 mg/dL 19  20  13    Creatinine 0.44 - 1.00 mg/dL 0.86  5.78  4.69   Sodium 135 - 145 mmol/L 136  136  137   Potassium 3.5 - 5.1 mmol/L 4.2  4.4  3.9   Chloride 98 - 111 mmol/L 103  103  106   CO2 22 - 32 mmol/L 20  29  21    Calcium 8.9 - 10.3 mg/dL 9.0  8.6  8.8   Total Protein 6.5 - 8.1 g/dL   7.3   Total Bilirubin <1.2 mg/dL   0.9   Alkaline Phos 38 - 126 U/L   46   AST 15 - 41 U/L   22   ALT 0 - 44 U/L   13      RADIOGRAPHIC STUDIES: I have personally reviewed the radiological images as listed and agreed with the findings in the report. No results  found.

## 2023-08-24 NOTE — Assessment & Plan Note (Signed)
 Encourage oral hydration and avoid nephrotoxins.

## 2023-08-24 NOTE — Assessment & Plan Note (Addendum)
 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 Bone marrow negative for lymphoma involvement.  Peripheral blood flow cytometry is negative. 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  second opinion at [facility] reviewed.  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 However, regardless of the exact histology,  would recommend similar management of an indolent, small volume B cell lymphoma resulting in recurrent effusions. Should patient experiences recurrent fluid accumulation, would recommend treatment with a course of Rituximab monotherapy   Pleural biopsy pathology review : While a B-cell predominance raises the possibility of a low-grade lymphoma, flow cytometry was negative for a clonal population and there is no overt immunophenotypic aberrancy.  The current findings are not diagnostic of lymphoma and pending outside DNA studies (B-cell clonality) should help to further exclude lymphoid malignancy.  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   B-cell gene arrangement is negative. Breathing status is stable.  Recommend observation for now. Status post pleural catheter,removal.

## 2023-08-24 NOTE — Assessment & Plan Note (Signed)
 Refer to GI

## 2023-08-24 NOTE — Progress Notes (Signed)
 Diagnosis:   1. Dyssynergic defecation                Referring Provider:Pegram, Casimiro Needle, [employer]    Insurance plan:   Payer/Plan Subscr DOB Sex Relation Sub. Ins. ID Effective Group Num   1. MEDICARE - [name], [name] Dec 03, 1945 Female Self [id number] 04/06/14                                    [address]   2. [name] 23-Jul-1945 Female Self [id number] 03/06/21                                    [employer] [facility] [facility]                         # of visits per insurance authorization:08/09/2023/TDC [id number]/[id number]/BENEFIT AUTH INFO  PT,OT, ST -PLAN: MEDICARE A AND B  PT & ST GET $[id number] COMBINED PCY USED- $0 POSSIBLE 28 VISITS REMAINING     *ONCE PATIENT REACHES THERAPY CAP IF IT'S MED NEC TO CONTINUE   THERAPIST MUST NOTIFY IV TEAM TO ADD KX MODIFIERS AND     PT/OT/SP-PLAN:  PT. HAS [employer] SU[employer] INS PLAN SECONDARY  FOLLOWS MEDICARE GUIDELINES, ONLY PAYS IF MEDICARE PAYS    # of visits per POC: 10   Date of Initial Eval: 08/24/23    Verified Signed POC:    []  YES  []   NO   Date:    Rerouted to Referral Source for signature via EMR []  Fax []   Mail []    Date:  Routed to PCP (if other than referring) for signature via   EMR []  Fax []    Mail []   Date:    Was POC updated?   []   YES []   NO    Date:        If yes, Verified Signed POC:    []   YES  []   NO   Date:        Precautions: G1P1- cesarean delivery, Carpal tunnel on L; TKA; wrist fx surgery , osteoporosis, TSA    Subjective: History of Current Problem: [name] is a 78 y.o. female who presents today with chief complaint of bowel dysfunction. This has been present for about 2 years since having L TKA June 2023. Was taking pain medication and feels this is what started it. Previously would have constipation and then 1-2 days of diarrhea. Started a regimen of metamucil/miralax about 1 year ago. Didn't help much. Had a colonoscopy which was fine. Has incomplete emptying most of the time. States she stool is usually formed but not firm. Just  doing metamucil every morning at this point. Husband passed away a couple years ago also recently retired- feels like this has changed her habits. Eating breakfast, lunch, dinner- very small amounts; usually not hungry. Pt has Osteoporosis. TSA August 25 2022- currently in PT for this because of a fall she had (pushed over by dog) after surgery. Hx of TKA. Hx of B wrist ORIF.     Objective: Posture:   Absence of TA or spinal ext activation in seated or standing postures    Significant thoracic kyphosis, lumbar lordosis, sway back posture  -will investigate further     Abdominal Palpation: TTP R lower quadrant  Diastasis Recti: Not tested     Breathing: chest dominant     Lumbar ROM                      Flexion ASSESS NV   Extension     SB     Rotation                               LE ROM       Left        Right          Hip flex Assess NV     Hip exten       Hip abd       Hip ER       Hip IR                                                LE  MMT  WFL except:       Left        Right          Hip flex Assess NV     Hip exten       Hip abd       Hip add       Hip ER       Hip IR          Core Strength:       Sahrmann Core Stability Test      Level 0 Unable to achieve level 1 position         Level 1 Begin in supine, hook-lying position while abdominal hollowing. Slowly raise 1 leg to 100 deg of hip flexion with comfortable knee flexion. Bring opposite leg to same position.   Level 2  From hip flexed position, slowly lower 1 leg until heel contacts surface. Slide heel out to fully extend the knee. Return to starting flexed position.   Level 3  From hip flexed position, slowly lower 1 leg until heel is 12 cm above surface. Slide heel out to fully extend the knee. Return to starting flexed position.   Level 4  From hip flexed position, slowly lower both legs until heel contacts surface.  Slide heel out to fully extend knees. Return to starting flexed position.   Level 5 From hip flexed position, slowly lower both legs  until heels are 12 cm above surface. Slide heel out to fully extend knees. Return to starting flexed position.         External Perineal Observation  Voluntary contraction PFM PRESENT   Normal Involuntary contraction (with cough) ABSENT   Perineal descent with bearing down / PFM mobility No muscle lengthening- increased intraabdominal pressure   Scarring / General skin condition None      Internal Pelvic Floor Palpation  PFM tone Normal   Muscle Volume Normal   Sensation Normal   Tender-trigger points, bands, spasms None         PFM Contraction Ability  MMT 1+ to 2-/5 all superficial muscle   Voluntary Relax Good   Endurance (sec) 5   # Quick contractions in 10 sec Unable to fully relax between contraction   Elevation ABSENT   Co-contraction ABSENT- was able to perform after training, min TrA activation  Verbal consent for internal assessment obtained      Activity Date: 08/2023         Visit #  1  Initial  eval        Breathing + PFM + TrA 5-10 breaths, various positions throughout day                                                  CPT code  Daily treatment record Treatment Time               PT Evaluation: 97001 Initial Evaluation 30   Self care:     Therapeutic Exercise: 97110     Therapeutic Activities: 97530 Pt education in PT eval findings, POC and information in anatomy & diagnosis provided.  -bowel routine: wake up, bowel massage, warm liquid, sit on toilet   -10 min max time on toilet  -no straining  -squatty potty  -importance of fiber: provided pt with fiber handout and recommendation 20-30 grams  -importance of hydration  -daily exercise  -ILU massage  -urgency and how brain bladder connection sometimes need retrained  -how posture impacts bowels 25   Manual therapy: 97140     Neuromuscular Re-education: 97112 Activities/exercise completed to strengthen core, improve pelvic muscle function and trunk stability and reduce pain in order to achieve goals:    See above for exercises    Internal  assessment: see above   15    Pt response to treatment today:   Good    Next visit:  Objective measures, core strengthening rectal assessment     Total Treatment TIme: 70   PFM - Pelvic Floor Muscles, STM - soft tissue mobilization,  MFR - myofascial release    Patient Education: 08/24/23: see above      Assessment: Pt is a 78 year old G1P1 female presenting with signs and symptoms consistent with constipation, abdominal pain, UUI  Pt demonstrated impaired motor control of PFM ( 2-) and TA, poor timing of PFMC with increased intra-abdominal pressure, poor postural awareness, and poor knowledge of good bowel and bladder habits.   Pt is a good candidate for physical therapy to address myofascial restrictions, joint hypo-mobility and malalignment, poor postural awareness, decreased core stability and hip strength, and resulting biomechanical and functional impairments. Patient/family received education on the purpose of therapy, participated in the development of the POC and verbalized understanding and agreement of POC and goals.      Goals of Therapy:      STG Goals - to be achieved in 2 visits:                           Initial Status 08/24/2023   Good knowledge of appropriate voiding intervals, toileting posture, and hygiene Poor Knowledge of appropriate voiding intervals, toileting posture, and hygiene  -discussed toilet position   Demonstrates good knowledge and implementation of self-relaxation, self-correction of posture, self-help / pain management techniques  Poor knowledge of self-relaxation, posture, pain management techniques      Independent in a progressive HEP to address functional limitations No current exercise program   LTG Goals - to be achieved in 10 visits:  Initial Status 08/24/2023   Increased strength of PFM  to 2+/5 MMT     Endurance 7-10 sec x 10 reps, Quick flicks: 6-8 reps in a 10 sec interval for improved continence/sphincter control, pelvic organ support and pelvic  ring stability trunk  PFM Contraction Ability  MMT 1+ to 2-/5 all superficial muscle   Voluntary Relax Good   Endurance (sec) 5   # Quick contractions in 10 sec Unable to fully relax between contraction   Elevation ABSENT   Co-contraction ABSENT- was able to perform after training, min TrA activation      Improved ROM of  lumbar spine/hips    Neutral lumbopelvic alignment Decreased ROM/flexibility of lumbar spine/hips  Lumbopelvic malalignment / postural impairment   Good core support to maintain correct postural align during ex and ADL's          Increase LE strength of glute/hips for improved pelvic ring stability, posture & functional mobility Poor core support / posture alignment     Inability to activate TrA     Decreased LE strengths          Able to utilize urge suppression & defer urination as appropriate.     Pt able to isolate [facility] with Knack maneuver to maintain continence with exertion (cough, lifting, walking, exercise)        Nocturia  2x/night     Urgency - unable to defer urination >5 min  Urinary incontinence with urgency,   Bowel movements every 1-2 days Bowel movements 2-3 x week   Outcome Test PFDI-20          PLAN:  1 x week for 10 weeks    Chay Mazzoni, PT, PT, DPT    This note serves as a Discharge Summary should the patient not return to physical therapy per the above plan of care.

## 2023-08-24 NOTE — Progress Notes (Signed)
 Physical Therapy Pelvic Floor Evaluation    Name: [name]     Date of Birth: [birth year]      MRN: [id number]    Date of evaluation: 08/24/2023  Referring Physician: Kathrine Haddock, CNP  [address]  [place],  [place] [address] Phone: [phone] Fax: [phone]  Date of Onset: 2 years  Primary Medical Diagnosis:   1. Dyssynergic defecation          Insurance: Payor: MEDICARE / Plan: MEDICARE A AND B / Product Type: Medicare /     Subjective/History:   History of Current Problem: [name] is a 78 y.o. female who presents today with chief complaint of bowel dysfunction. This has been present for about 2 years since having L TKA June 2023. Was taking pain medication and feels this is what started it. Previously would have constipation and then 1-2 days of diarrhea. Started a regimen of metamucil/miralax about 1 year ago. Didn't help much. Had a colonoscopy which was fine. Has incomplete emptying most of the time. States she stool is usually formed but not firm. Just doing metamucil every morning at this point. Husband passed away a couple years ago also recently retired- feels like this has changed her habits. Eating breakfast, lunch, dinner- very small amounts; usually not hungry. Pt has Osteoporosis. TSA August 25 2022- currently in PT for this because of a fall she had (pushed over by dog) after surgery. Hx of TKA. Hx of B wrist ORIF.    Bladder - Bowel - Pelvic Floor History:    Bladder:    Incontinence: PRESENT with coughing, laughing, sneezing, lifting heavy objects, exercising, walking to the bathroom, rising from sitting, forward bending;  Pt use 1-2 pads per day- level 1 pad  Pt has 1-2 incontinence episodes during day.  Amount of leakage small   Pain with emptying: ABSENT  Strain: PRESENT- rarely  Retention: ABSENT  Urgency: PRESENT occurs with getting to her house  Frequency:  / day; 2/night      Bowels:    Fecal and gas Incontinence: PRESENT- rare- had this when having diarrhea   Strain:  PRESENT  Constipation: PRESENT;   Incomplete emptying: PRESENT  Pain with bowel movements: ABSENT  Splitting/manual evacuation: ABSENT  Bowel Interval: 2-3 x week  Typical Consistency ([place] 1-7): Type 4-5    Prolapse symptoms:   ABSENT    Sexual Health    ABSENT    Pain     Pain Location: R lower abdomen    Intake Habits:     Bladder irritants: 2 cups of coffee in AM, orange juice, occasional wine  Fiber (25-30 g/day): unsure  Water Intake (6-8 8 oz glasses): 5-6 glasses of water    Work History: Retired- plays violin. Still plays.   Activities/Exercise: walking for exercise- 2-2.5 miles 2 x day   Social Hx: Pt lives independently. Husband passed away. Retired    Equities trader Goals: by discharge, the patient wants to have/be able to improve her bowel habits, reduce her abdominal pain/bloating.    Co morbidities, medical hx, and Ob/GYN history: G1P1- cesarean delivery, Carpal tunnel on L; TKA; wrist fx surgery    Learning Assessment:   [x]  Patient is able to communicate with therapist and verbalize understanding of directions/instructions   []  Patient is unable to communicate with therapist and/or verbalize understanding of directions/understanding due to the following barriers to learning:   []   Reading  [] Language  [] Visual  [] Hearing  []  Other:  Is an interpreter  required? [] Yes [x]  No      Pelvic Floor Pt History Intake Questionnaire (scanned into chart)  PMH/PSH:   Past Medical History:   Diagnosis Date    Acquired absence of ovary, unilateral     Missing left ovary.    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    History of fall     fell 10/2015    Low back pain     Osteoarthritis     Osteoporosis     Urticaria     Varicella    ,   Past Surgical History:   Procedure Laterality Date    ARTHROSCOPY SHOULDER Right 02/17/2022    Procedure: Right  Shoulder Arthroscopic Balloon Arthroplasty;  Surgeon: Andree Elk, MD;  Location: [facility];  Service: Orthopedics;  Laterality: Right;    CESAREAN SECTION  1985    COLONOSCOPY N/A 01/30/2023    Procedure: COLONOSCOPY W/ BIOPSY;  Surgeon: Jeannene Patella, MD;  Location: [facility];  Service: Gastroenterology;  Laterality: N/A;    HARDWARE REMOVAL  8/09    of left tibia plateau    LEG SURGERY  05/09/06    fractured left tibia plateau    RELEASE CARPAL TUNNEL Left 03/22/2016    Procedure: LEFT CARPAL TUNNEL RELEASE;  Surgeon: Kathrene Bongo, MD;  Location: [facility];  Service: Orthopedics;  Laterality: Left;    TOTAL KNEE ARTHROPLASTY Left 11/09/2021    Procedure: LEFT TOTAL KNEE ARTHROPLASTY;  Surgeon: Skipper Cliche, MD;  Location: [facility];  Service: Orthopedics;  Laterality: Left;    WRIST FRACTURE SURGERY Left 11/13/2015    Procedure: OPEN REDUCTION INTERNAL FIXATION LEFT DISTAL RADIUS FRACTURE;  Surgeon: Kathrene Bongo, MD;  Location: [facility];  Service: Orthopedics;  Laterality: Left;     Medications: She  has a past medical history of Acquired absence of ovary, unilateral, Carpal tunnel syndrome on left (03/18/2016), Closed fracture of upper end of tibia (12/18/2009), Congenital absence of one kidney, History of fall, Low back pain, Osteoarthritis, Osteoporosis, Urticaria, and Varicella.  She does not have any pertinent problems on file.  She  has a past surgical history that includes Cesarean section (1985); Leg Surgery (05/09/06); Hardware Removal (8/09); Wrist fracture surgery (Left, 11/13/2015); release carpal tunnel (Left, 03/22/2016); Total knee arthroplasty (Left, 11/09/2021); arthroscopy shoulder (Right, 02/17/2022); and Colonoscopy (N/A, 01/30/2023).  Her family history includes Stroke in her mother.  She  reports that she has never smoked. She has never used smokeless tobacco. She reports current alcohol use of about 4.0 standard drinks of alcohol per week. She reports that she does not use drugs.  She has a current  medication list which includes the following prescription(s): acetaminophen, alendronate, calcium carbonate, fluticasone propionate, lactobacillus rhamnosus (gg), ofloxacin, ofloxacin, prednisone, psyllium, and senna-docusate, and the following Facility-Administered Medications: bupivacaine hcl, lidocaine, and triamcinolone acetonide.  Home Medications    Medication Sig Taking? Last Dose   acetaminophen (TYLENOL) 500 MG tablet Take 2 tablets (1,000 mg total) by mouth every 8 hours.     alendronate (FOSAMAX) 35 MG tablet Take 1 tablet (35 mg total) by mouth every 7 days.     calcium carbonate (OS-CAL) 500 mg calcium (1,250 mg) chewable tablet Chew 1 tablet (1,250 mg total) by mouth daily.     fluticasone propionate (FLONASE)  50 mcg/actuation nasal spray shake liquid and use 2 sprays in each nostril daily     lactobacillus rhamnosus, GG, (CULTURELLE) 10 billion cell capsule Take 1 capsule by mouth daily.     ofloxacin (FLOXIN) 0.3 % otic solution Place 5 drops into the right ear daily.     ofloxacin (FLOXIN) 0.3 % otic solution Place 5 drops into the right ear daily.     predniSONE (DELTASONE) 20 MG tablet Take 3 tablets ( 60 mg) once daily for seven days. Then take 2 tablets ( 40 mg) once daily for three days. Then take 1 tablets ( 20 mg) once daily for two days. Then take 1/2 tablet ( 10 mg) once daily for two days.     psyllium (KONSYL) Powd Take 1 tbsp in 8 oz water daily     senna-docusate (SENNOSIDES-DOCUSATE SODIUM) 8.6-50 mg per tablet Take 1 tablet by mouth every 12 hours as needed for Constipation.       She is allergic to iodine, iodine and iodide containing products, iodinated contrast media, and shellfish containing products.      Prior Treatments:rest    Objective measures:    Informed consent for internal evaluation  -  Consent given   Yes    Posture:   Absence of TA or spinal ext activation in seated or standing postures    Significant thoracic kyphosis, lumbar lordosis, sway back posture  -will  investigate further    Abdominal Palpation: TTP R lower quadrant    Diastasis Recti: Not tested    Breathing: chest dominant    Lumbar ROM                      Flexion ASSESS NV   Extension    SB    Rotation        LE ROM       Left        Right          Hip flex Assess NV    Hip exten     Hip abd     Hip ER     Hip IR            LE  MMT  WFL except:       Left        Right          Hip flex Assess NV    Hip exten     Hip abd     Hip add     Hip ER     Hip IR       Core Strength:   Sahrmann Core Stability Test     Level 0 Unable to achieve level 1 position       Level 1 Begin in supine, hook-lying position while abdominal hollowing. Slowly raise 1 leg to 100 deg of hip flexion with comfortable knee flexion. Bring opposite leg to same position.   Level 2  From hip flexed position, slowly lower 1 leg until heel contacts surface. Slide heel out to fully extend the knee. Return to starting flexed position.   Level 3  From hip flexed position, slowly lower 1 leg until heel is 12 cm above surface. Slide heel out to fully extend the knee. Return to starting flexed position.   Level 4  From hip flexed position, slowly lower both legs until heel contacts surface.  Slide heel out to fully extend knees. Return to starting flexed position.  Level 5 From hip flexed position, slowly lower both legs until heels are 12 cm above surface. Slide heel out to fully extend knees. Return to starting flexed position.       External Perineal Observation  Voluntary contraction PFM PRESENT   Normal Involuntary contraction (with cough) ABSENT   Perineal descent with bearing down / PFM mobility No muscle lengthening- increased intraabdominal pressure   Scarring / General skin condition None     Internal Pelvic Floor Palpation  PFM tone Normal   Muscle Volume Normal   Sensation Normal   Tender-trigger points, bands, spasms None       PFM Contraction Ability  MMT 1+ to 2-/5 all superficial muscle   Voluntary Relax Good   Endurance (sec) 5   #  Quick contractions in 10 sec Unable to fully relax between contraction   Elevation ABSENT   Co-contraction ABSENT- was able to perform after training, min TrA activation       Treatment today:  Evaluation/examination  x Bladder diary given    Bladder and PFM education  x PFM/ other Ex  x     Assessment:  Pt is a 78 year old G1P1 female presenting with signs and symptoms consistent with constipation, abdominal pain, UUI  Pt demonstrated impaired motor control of PFM ( 2-) and TA, poor timing of PFMC with increased intra-abdominal pressure, poor postural awareness, and poor knowledge of good bowel and bladder habits.   Pt is a good candidate for physical therapy to address myofascial restrictions, joint hypo-mobility and malalignment, poor postural awareness, decreased core stability and hip strength, and resulting biomechanical and functional impairments. Patient/family received education on the purpose of therapy, participated in the development of the POC and verbalized understanding and agreement of POC and goals.    Prognosis to achieve goals: Good      STG Goals - to be achieved in 2 visits:                           Initial Status 08/24/2023   Good knowledge of appropriate voiding intervals, toileting posture, and hygiene Poor Knowledge of appropriate voiding intervals, toileting posture, and hygiene  -discussed toilet position   Demonstrates good knowledge and implementation of self-relaxation, self-correction of posture, self-help / pain management techniques  Poor knowledge of self-relaxation, posture, pain management techniques     Independent in a progressive HEP to address functional limitations No current exercise program   LTG Goals - to be achieved in 10 visits:                           Initial Status 08/24/2023   Increased strength of PFM  to 2+/5 MMT    Endurance 7-10 sec x 10 reps, Quick flicks: 6-8 reps in a 10 sec interval for improved continence/sphincter control, pelvic organ support and pelvic  ring stability trunk  PFM Contraction Ability  MMT 1+ to 2-/5 all superficial muscle   Voluntary Relax Good   Endurance (sec) 5   # Quick contractions in 10 sec Unable to fully relax between contraction   Elevation ABSENT   Co-contraction ABSENT- was able to perform after training, min TrA activation      Improved ROM of  lumbar spine/hips    Neutral lumbopelvic alignment Decreased ROM/flexibility of lumbar spine/hips  Lumbopelvic malalignment / postural impairment   Good core support to maintain correct postural align during ex and  ADL's          Increase LE strength of glute/hips for improved pelvic ring stability, posture & functional mobility Poor core support / posture alignment    Inability to activate TrA    Decreased LE strengths        Able to utilize urge suppression & defer urination as appropriate.     Pt able to isolate [facility] with Knack maneuver to maintain continence with exertion (cough, lifting, walking, exercise)      Nocturia  2x/night    Urgency - unable to defer urination >5 min  Urinary incontinence with urgency,   Bowel movements every 1-2 days Bowel movements 2-3 x week   Outcome Test [id number]     Evaluation Code Matrix  History:  Number of personal factors and comorbidities (includes relevant medical complications, complicating behaviors/beliefs, communication issues, mentation, etc):  [] 0  97161        [x] 1-2 97162               [] >=3 97163   Examination Elements: includes number of activity/ participation limitations, affected body structure (s), and affected body functions (example, ROM, strength, coordination, tone )  [] 1-2 elements 97161             [x] 3 elements 97162        [] =>4 elements 97163   Clinical Presentation  []  97161 Stable (unchanging, uncomplicated, predicted rate of recovery)    [x]  97162 Evolving (changing clinical characteristics (improving/regressing,)   [] 97163 Unstable (unpredictable characteristics,(fluctuating pain, tone, function, BP response, etc)   Evaluation Code  ( select the lowest code issued on the above components)  []  Low Complexity 97161  [x]  Moderate Complexity 97162      []  High Complexity 97163       Recommendations and Plan:  Pt to be seen for therapeutic exercise (97110) & neuromuscular re-education (53664) for coordination, strength and flexibility, manual therapy (97140) for soft tissue/joint mobilization/myofascial release, therapeutic activities (97530) for patient education in bowel/bladder training, diet and proper body mechanics, self-pain management techniques, home ex program. Modalities as needed for pain management or muscle re-education.   Pt will be seen 1 times/week for 10 weeks, for total of 10 visits.    Carney Bern, PT, PT, DPT, CIDN    08/24/2023    Physician Certification   I certify that the above patient is under my care and requires the above services. These professional services are to be provided from an established plan, related to the diagnosis and reviewed by me every 90 days.     Additional comments/revisions:     Physician Name: Kathrine Haddock, CNP    This note serves as a Discharge Summary should the patient not return to physical therapy per the above plan of care.

## 2023-08-29 ENCOUNTER — Encounter: Admit: 2023-08-29 | Discharge: 2023-08-29 | Payer: MEDICARE

## 2023-08-29 DIAGNOSIS — K5902 Outlet dysfunction constipation: Secondary | ICD-10-CM

## 2023-08-29 NOTE — Progress Notes (Signed)
 Diagnosis:   1. Dyssynergic defecation                Referring Provider:Pegram, Casimiro Needle, [employer]    Insurance plan:   Payer/Plan Subscr DOB Sex Relation Sub. Ins. ID Effective Group Num   1. MEDICARE - MERAFAELLA, KOLE 1946/04/17 Female Self [id number] 04/06/14                                    [address]   2. GENERIC COMMEDEVA, RON June 15, 1945 Female Self [id number] 03/06/21                                    [employer] [facility] [facility]                         # of visits per insurance authorization:08/09/2023/TDC IV/LB/[id number]/BENEFIT AUTH INFO  PT,OT, ST -PLAN: MEDICARE A AND B  PT & ST GET [id number] COMBINED PCY USED- $0 POSSIBLE 28 VISITS REMAINING     *ONCE PATIENT REACHES THERAPY CAP IF IT'S MED NEC TO CONTINUE   THERAPIST MUST NOTIFY IV TEAM TO ADD KX MODIFIERS AND     PT/OT/SP-PLAN:  PT. HAS [employer] SUPP[employer] INS PLAN SECONDARY  FOLLOWS MEDICARE GUIDELINES, ONLY PAYS IF MEDICARE PAYS    # of visits per POC: 10   Date of Initial Eval: 08/24/23    Verified Signed POC:    []  YES  []   NO   Date:    Rerouted to Referral Source for signature via EMR []  Fax []   Mail []    Date:  Routed to PCP (if other than referring) for signature via   EMR []  Fax []    Mail []   Date:    Was POC updated?   []   YES []   NO    Date:        If yes, Verified Signed POC:    []   YES  []   NO   Date:        Precautions: G1P1- cesarean delivery, Carpal tunnel on L; TKA; wrist fx surgery , osteoporosis, TSA    Subjective: 08/29/23: Pt reports she is overall feeling good. Feels like the awareness of her bowel habits has really helped her understand her symptoms. She is now using a stool under her feet for bowel movements. Has stopped straining and is working on breathing lengthening technique for emptying. She has been having daily bowel movements and sticking with a bowel routine. Also has been doing the bowel massage which really helps.    History of Current Problem: [name] is a 78 y.o. female who presents today with chief complaint of  bowel dysfunction. This has been present for about 2 years since having L TKA June 2023. Was taking pain medication and feels this is what started it. Previously would have constipation and then 1-2 days of diarrhea. Started a regimen of metamucil/miralax about 1 year ago. Didn't help much. Had a colonoscopy which was fine. Has incomplete emptying most of the time. States she stool is usually formed but not firm. Just doing metamucil every morning at this point. Husband passed away a couple years ago also recently retired- feels like this has changed her habits. Eating breakfast, lunch, dinner- very small amounts; usually not hungry. Pt has Osteoporosis. TSA  August 25 2022- currently in PT for this because of a fall she had (pushed over by dog) after surgery. Hx of TKA. Hx of B wrist ORIF.     Objective: Posture:   Absence of TA or spinal ext activation in seated or standing postures    Significant thoracic kyphosis, lumbar lordosis, sway back posture  -spine rotated to L  -hip shifted to R     Abdominal Palpation: TTP R lower quadrant     Diastasis Recti: Not tested     Breathing: chest dominant     Lumbar ROM                      Flexion 75%   Extension  15%   SB  L 15%; R 25%   Rotation                               LE MMT       Left        Right          Hip flex 4/5 4/5   Hip exten       Hip abd  3/5 4-/5   Hip ER 4-/5 4-/5   Hip IR 4-/5 4-/5                                            LE  ROM       Left        Right          Hip flex 95 95   Hip exten       Hip abd       Hip add       Hip ER  35  20   Hip IR  15  0      Core Strength:       Sahrmann Core Stability Test      Level 0 Unable to achieve level 1 position         Level 1 Begin in supine, hook-lying position while abdominal hollowing. Slowly raise 1 leg to 100 deg of hip flexion with comfortable knee flexion. Bring opposite leg to same position.   Level 2  From hip flexed position, slowly lower 1 leg until heel contacts surface. Slide heel out to fully  extend the knee. Return to starting flexed position.   Level 3  From hip flexed position, slowly lower 1 leg until heel is 12 cm above surface. Slide heel out to fully extend the knee. Return to starting flexed position.   Level 4  From hip flexed position, slowly lower both legs until heel contacts surface.  Slide heel out to fully extend knees. Return to starting flexed position.   Level 5 From hip flexed position, slowly lower both legs until heels are 12 cm above surface. Slide heel out to fully extend knees. Return to starting flexed position.         External Perineal Observation  Voluntary contraction PFM PRESENT   Normal Involuntary contraction (with cough) ABSENT   Perineal descent with bearing down / PFM mobility No muscle lengthening- increased intraabdominal pressure   Scarring / General skin condition None      Internal Pelvic Floor Palpation  PFM tone Normal   Muscle Volume Normal  Sensation Normal   Tender-trigger points, bands, spasms None         PFM Contraction Ability  MMT 1+ to 2-/5 all superficial muscle   Voluntary Relax Good   Endurance (sec) 5   # Quick contractions in 10 sec Unable to fully relax between contraction   Elevation ABSENT   Co-contraction ABSENT- was able to perform after training, min TrA activation       Rectal assessment: good squeeze and lift with contraction                                   Good lengthening of PFM with bearing down- increased activation of rectal   sphincter    Verbal consent for internal assessment obtained      Activity Date: 08/2023 08/29/23        Visit #  1  Initial  eval 2       Breathing + PFM + TrA 5-10 breaths, various positions throughout day Practiced with internal rectal exam    Breathing+ lengthening PFM for bowel movements;    Attic, 1st floor, basement                                                 CPT code  Daily treatment record Treatment Time               PT Evaluation: 97001 Initial Evaluation    Self care:     Therapeutic Exercise:  97110     Therapeutic Activities: 97530 Pt education in PT eval findings, POC and information in anatomy & diagnosis provided.  - edu pt on external anal sphincter and how contracting when bearing down can contribute to incomplete emptying  -reviewed bowel habits: stool under feet, fiber 20-30 grams, bowel routine, bowel massage, emptying strategies  - concept of PFM attic, 1st floor, basement and when you are using the muscles in those different functions 22   Manual therapy: 97140 ILU bowel massage for demonstration 8'   Neuromuscular Re-education: [phone] Activities/exercise completed to strengthen core, improve pelvic muscle function and trunk stability and reduce pain in order to achieve goals:    See above for exercises    Internal assessment: see above; rectal assessment performed today: practiced PFM lengthening with bearing down- tactile cues with tapping to relax PFM    -use of towel roll (seated) or mirror for tactile and visual cues for PFM lengthening   25    Pt response to treatment today:   Good    Next visit:  core strengthening rectal assessment     Total Treatment TIme: 55   PFM - Pelvic Floor Muscles, STM - soft tissue mobilization,  MFR - myofascial release    Patient Education: 08/24/23: -bowel routine: wake up, bowel massage, warm liquid, sit on toilet   -10 min max time on toilet  -no straining  -squatty potty  -importance of fiber: provided pt with fiber handout and recommendation 20-30 grams  -importance of hydration  -daily exercise  -ILU massage  -urgency and how brain bladder connection sometimes need retrained  -how posture impacts bowels      Assessment: Pt has noticed significant improvement in building awareness of pelvic floor muscles and bowel habits which has allowed her to feel more confident  in managing her symptoms. She has been sticking with a bowel routine, increasing her fiber intake, reducing straining, performing ILU massage, and using a stool under feet all of which have been  helping. Performed rectal assessment this visit however it was noted that she tends to contract external anal sphincter when bearing down. All other muscles lengthen well. This can contribute to incomplete emptying. Practiced this with internal assessment today and edu pt on how to practice this at home. Will recheck NV .    Pt is a 78 year old G1P1 female presenting with signs and symptoms consistent with constipation, abdominal pain, UUI  Pt demonstrated impaired motor control of PFM ( 2-) and TA, poor timing of PFMC with increased intra-abdominal pressure, poor postural awareness, and poor knowledge of good bowel and bladder habits.   Pt is a good candidate for physical therapy to address myofascial restrictions, joint hypo-mobility and malalignment, poor postural awareness, decreased core stability and hip strength, and resulting biomechanical and functional impairments. Patient/family received education on the purpose of therapy, participated in the development of the POC and verbalized understanding and agreement of POC and goals.      Goals of Therapy:      STG Goals - to be achieved in 2 visits:                           Initial Status 08/24/2023   Good knowledge of appropriate voiding intervals, toileting posture, and hygiene Poor Knowledge of appropriate voiding intervals, toileting posture, and hygiene  -discussed toilet position   Demonstrates good knowledge and implementation of self-relaxation, self-correction of posture, self-help / pain management techniques  Poor knowledge of self-relaxation, posture, pain management techniques      Independent in a progressive HEP to address functional limitations No current exercise program   LTG Goals - to be achieved in 10 visits:                           Initial Status 08/24/2023   Increased strength of PFM  to 2+/5 MMT     Endurance 7-10 sec x 10 reps, Quick flicks: 6-8 reps in a 10 sec interval for improved continence/sphincter control, pelvic organ support  and pelvic ring stability trunk  PFM Contraction Ability  MMT 1+ to 2-/5 all superficial muscle   Voluntary Relax Good   Endurance (sec) 5   # Quick contractions in 10 sec Unable to fully relax between contraction   Elevation ABSENT   Co-contraction ABSENT- was able to perform after training, min TrA activation      Improved ROM of  lumbar spine/hips    Neutral lumbopelvic alignment Decreased ROM/flexibility of lumbar spine/hips  Lumbopelvic malalignment / postural impairment   Good core support to maintain correct postural align during ex and ADL's          Increase LE strength of glute/hips for improved pelvic ring stability, posture & functional mobility Poor core support / posture alignment     Inability to activate TrA     Decreased LE strengths          Able to utilize urge suppression & defer urination as appropriate.     Pt able to isolate [facility] with Knack maneuver to maintain continence with exertion (cough, lifting, walking, exercise)        Nocturia  2x/night     Urgency - unable to defer urination >5 min  Urinary incontinence with urgency,   Bowel movements every 1-2 days Bowel movements 2-3 x week   Outcome Test PFDI-20          PLAN:  1 x week for 10 weeks    Deseray Daponte, PT, PT, DPT    This note serves as a Discharge Summary should the patient not return to physical therapy per the above plan of care.

## 2023-08-30 ENCOUNTER — Encounter: Payer: Self-pay | Admitting: Pulmonary Disease

## 2023-08-30 ENCOUNTER — Ambulatory Visit (INDEPENDENT_AMBULATORY_CARE_PROVIDER_SITE_OTHER): Payer: Medicare (Managed Care) | Admitting: Pulmonary Disease

## 2023-08-30 VITALS — BP 120/64 | HR 87 | Resp 14 | Ht 60.0 in

## 2023-08-30 DIAGNOSIS — J90 Pleural effusion, not elsewhere classified: Secondary | ICD-10-CM

## 2023-08-30 NOTE — Progress Notes (Signed)
 Synopsis: Referred in by [facility]*   Subjective:   PATIENT ID: [name] GENDER: female DOB: 1945-08-22, MRN: [id number]  Chief Complaint  Patient presents with   Follow-up    Breathing has been good oxygen 93-94    HPI [name] is a 78 year old female patient with a past medical history of paroxysmal A-fib on Eliquis, asthma, hypertension, hypothyroidism, history of DVT who is presenting today to the pulmonary office for follow-up on pleural effusion.  She presented initially to [facility] in November for shortness of breath, night sweats, unintentional weight loss and loss of appetite.  She was found to have a large right pleural effusion.  She underwent a thoracentesis on 11/3 which revealed an exudative effusion with lymphocyte predominance.  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 Flow cytometry was sent which was consistent with a CD10 positive monoclonal B-cell population.  this prompted a bone marrow biopsy on 11/20 which did not show any CD34 positive blastic population nor monoclonal B-cell population or abnormal T-cell.  Blood flow cytometry was also negative.  CT abdomen pelvis 11/8 nodular liver contour strongly suggestive of cirrhosis.  CT chest 11/8 moderate to large layering right pleural effusion.  No mediastinal lymphadenopathy.  No other suspicious lesions.  PET scan 11/13 -no specific findings for malignancy.  Redemonstrated liver cirrhosis and moderate right pleural effusion.  Echocardiogram 11/03 -LVEF 65 to 70%.  Normal RV function.  No significant valvular disease.  She has had 4 ultrasound guided thoracentesis so far. Seen by Dr. Cathie Hoops and suspicion for pleural lymphoma.   S/p pleural biopsy and IPC placement on 05/29/2023. Path with prominent atypical lymphoid cells with B-cell population predominance.   She was referred to [facility] for a second opinion and reportedly has mentioned  that no plan for chemo and this be followed clinically.   Her IPC has not been draining much and it hasn't been accessed for 2 weeks. [place] in office without any effusion.   Breathing has since improved. She is only using her advair as needed. Currently receiving Iron transfusion at the cancer center.   Echocardiogram 11/03 - with normal EF 55 to 60%, Grade I diastolic dysfunction. Normal RV size and function and no signs of tricuspid regurgitation.   Family history -no family history of pulmonary disease.  Social history -never smoker, denies alcohol use and denies any illicit drug use.  ROS All systems were reviewed and are negative except for the above.  Objective:   Vitals:   08/30/23 1053  BP: 120/64  Pulse: 87  Resp: 14  SpO2: 93%  Height: 5' (1.524 m)   93% on RA BMI Readings from Last 3 Encounters:  08/30/23 28.65 kg/m  08/24/23 28.65 kg/m  07/13/23 27.77 kg/m   Wt Readings from Last 3 Encounters:  08/24/23 146 lb 11.2 oz (66.5 kg)  07/13/23 142 lb 3.2 oz (64.5 kg)  07/12/23 142 lb (64.4 kg)    Physical Exam GEN: NAD, Healthy Appearing HEENT: Supple Neck, Reactive Pupils, EOMI  CVS: Normal S1, Normal S2, RRR, No murmurs or ES appreciated  Lungs: Bibasilar crackles noted.  Abdomen: Soft, non tender, non distended, + BS  Extremities: Warm and well perfused, No edema  Skin: No suspicious lesions appreciated  Psych: Normal Affect  [place] thorax in clinic without recurrent effusion.   Labs and imaging were reviewed.  Ancillary Information   CBC    Component Value Date/Time   WBC 5.6 08/22/2023 1124  WBC 7.1 07/10/2023 1629   RBC 4.03 08/22/2023 1124   RBC 3.96 08/22/2023 1123   HGB 9.5 (L) 08/22/2023 1124   HCT 32.5 (L) 08/22/2023 1124   PLT 143 (L) 08/22/2023 1124   MCV 80.6 08/22/2023 1124   MCH 23.6 (L) 08/22/2023 1124   MCHC 29.2 (L) 08/22/2023 1124   RDW 19.2 (H) 08/22/2023 1124   LYMPHSABS 1.4 08/22/2023 1124   MONOABS 0.3 08/22/2023 1124    EOSABS 0.1 08/22/2023 1124   BASOSABS 0.0 08/22/2023 1124       No data to display           Assessment & Plan:  [name] is a 78 year old female patient with a past medical history of paroxysmal A-fib on Eliquis, asthma, hypertension, hypothyroidism, history of DVT who is presenting today to the pulmonary office for follow-up on pleural effusion.  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  Fluid recurrence could be impacted by liver cirrhosis as well.  Ultrasound in office shows recurrence of right pleural effusion, anechoic noncomplex.  Ordered chest x-ray.    S/p pleural biopsy and IPC placement on 05/29/2023. Path with prominent atypical lymphoid cells with B-cell population predominance.   She was referred to [facility] for a second opinion and reportedly has mentioned that no plan for chemo and this be followed clinically.   Her IPC has not been draining much and it hasn't been accessed for 2 weeks. [place] in office without any effusion. S/p IPC removal on 07/05/2023. Will obtain a CXR today for eval for recurrence.   Follows with oncology for Iron def anemia.   Moderate persistent asthma. Advised daily use of advair.  PFTs ordered and pending. \  RTC 3 monhts.   I spent 33 minutes caring for this patient today, including preparing to see the patient, obtaining a medical history , reviewing a separately obtained history, performing a medically appropriate examination and/or evaluation, counseling and educating the  patient/family/caregiver, ordering medications, tests, or procedures, documenting clinical information in the electronic health record, and independently interpreting results (not separately reported/billed) and communicating results to the patient/family/caregiver  Janann Colonel, MD [facility] 08/30/2023 10:57 AM

## 2023-08-31 ENCOUNTER — Inpatient Hospital Stay: Payer: Medicare (Managed Care)

## 2023-09-01 ENCOUNTER — Inpatient Hospital Stay: Payer: Medicare (Managed Care)

## 2023-09-01 VITALS — BP 127/59 | HR 61 | Temp 98.5°F | Resp 17

## 2023-09-01 DIAGNOSIS — C8599 Non-Hodgkin lymphoma, unspecified, extranodal and solid organ sites: Secondary | ICD-10-CM

## 2023-09-01 DIAGNOSIS — D509 Iron deficiency anemia, unspecified: Secondary | ICD-10-CM | POA: Diagnosis not present

## 2023-09-01 MED ORDER — IRON SUCROSE 20 MG/ML IV SOLN
200.0000 mg | Freq: Once | INTRAVENOUS | Status: AC
  Administered 2023-09-01: 200 mg via INTRAVENOUS

## 2023-09-04 ENCOUNTER — Encounter: Admit: 2023-09-04 | Discharge: 2023-09-04 | Payer: MEDICARE

## 2023-09-04 DIAGNOSIS — K5902 Outlet dysfunction constipation: Secondary | ICD-10-CM

## 2023-09-04 NOTE — Progress Notes (Signed)
 Diagnosis:   1. Dyssynergic defecation                Referring Provider:Pegram, Casimiro Needle, [employer]    Insurance plan:   Payer/Plan Subscr DOB Sex Relation Sub. Ins. ID Effective Group Num   1. MEDICARE - MEDYMOND, GUTT 09/24/1945 Female Self [id number] 04/06/14                                    [address]   2. GENERIC COMMESHERON, TALLMAN 09-Mar-1946 Female Self [id number] 03/06/21                                    [facility] [employer] [facility]                         # of visits per insurance authorization:08/09/2023/TDC IV/LB/[id number]/BENEFIT AUTH INFO  PT,OT, ST -PLAN: MEDICARE A AND B  PT & ST GET [id number] COMBINED PCY USED- $0 POSSIBLE 28 VISITS REMAINING     *ONCE PATIENT REACHES THERAPY CAP IF IT'S MED NEC TO CONTINUE   THERAPIST MUST NOTIFY IV TEAM TO ADD KX MODIFIERS AND     PT/OT/SP-PLAN:  PT. HAS [employer] INS PLAN SECONDARY  FOLLOWS MEDICARE GUIDELINES, ONLY PAYS IF MEDICARE PAYS    # of visits per POC: 10   Date of Initial Eval: 08/24/23    Verified Signed POC:    [x]  YES  []   NO   Date: Attestation signed by Kathrine Haddock, CNP at 08/24/2023  2:04 PM     Rerouted to Referral Source for signature via EMR []  Fax []   Mail []    Date:  Routed to PCP (if other than referring) for signature via   EMR []  Fax []    Mail []   Date:    Was POC updated?   []   YES []   NO    Date:        If yes, Verified Signed POC:    []   YES  []   NO   Date:        Precautions: G1P1- cesarean delivery, Carpal tunnel on L; TKA; wrist fx surgery , osteoporosis, TSA    Subjective: 09/04/23: Pt reports she feels like she is getting the hang of trying to contract and relax and pelvic floor. Pt is having bowel movements every day to every other day. States after bowel movements is when she generally feels bloated and full. Massage helped a little with this. States still feels like she is having incomplete emptying. She has been noticing an odor from vagina this is concerning to her.    08/29/23: Pt reports she is overall feeling good. Feels  like the awareness of her bowel habits has really helped her understand her symptoms. She is now using a stool under her feet for bowel movements. Has stopped straining and is working on breathing lengthening technique for emptying. She has been having daily bowel movements and sticking with a bowel routine. Also has been doing the bowel massage which really helps.    History of Current Problem: [name] is a 78 y.o. female who presents today with chief complaint of bowel dysfunction. This has been present for about 2 years since having L TKA June 2023. Was taking pain medication and feels this is  what started it. Previously would have constipation and then 1-2 days of diarrhea. Started a regimen of metamucil/miralax about 1 year ago. Didn't help much. Had a colonoscopy which was fine. Has incomplete emptying most of the time. States she stool is usually formed but not firm. Just doing metamucil every morning at this point. Husband passed away a couple years ago also recently retired- feels like this has changed her habits. Eating breakfast, lunch, dinner- very small amounts; usually not hungry. Pt has Osteoporosis. TSA August 25 2022- currently in PT for this because of a fall she had (pushed over by dog) after surgery. Hx of TKA. Hx of B wrist ORIF.     Objective: Posture:   Absence of TA or spinal ext activation in seated or standing postures    Significant thoracic kyphosis, lumbar lordosis, sway back posture  -spine rotated to L  -hip shifted to R     Abdominal Palpation: TTP R lower quadrant     Diastasis Recti: Not tested     Breathing: chest dominant     Lumbar ROM                      Flexion 75%   Extension  15%   SB  L 15%; R 25%   Rotation                               LE MMT       Left        Right          Hip flex 4/5 4/5   Hip exten       Hip abd  3/5 4-/5   Hip ER 4-/5 4-/5   Hip IR 4-/5 4-/5                                            LE  ROM       Left        Right          Hip flex 95 95    Hip exten       Hip abd       Hip add       Hip ER  35  20   Hip IR  15  0      Core Strength:       Sahrmann Core Stability Test      Level 0 Unable to achieve level 1 position         Level 1 Begin in supine, hook-lying position while abdominal hollowing. Slowly raise 1 leg to 100 deg of hip flexion with comfortable knee flexion. Bring opposite leg to same position.   Level 2  From hip flexed position, slowly lower 1 leg until heel contacts surface. Slide heel out to fully extend the knee. Return to starting flexed position.   Level 3  From hip flexed position, slowly lower 1 leg until heel is 12 cm above surface. Slide heel out to fully extend the knee. Return to starting flexed position.   Level 4  From hip flexed position, slowly lower both legs until heel contacts surface.  Slide heel out to fully extend knees. Return to starting flexed position.   Level 5 From hip flexed position, slowly lower both legs  until heels are 12 cm above surface. Slide heel out to fully extend knees. Return to starting flexed position.         External Perineal Observation  Voluntary contraction PFM PRESENT   Normal Involuntary contraction (with cough) ABSENT   Perineal descent with bearing down / PFM mobility No muscle lengthening- increased intraabdominal pressure   Scarring / General skin condition None      Internal Pelvic Floor Palpation  PFM tone Normal   Muscle Volume Normal   Sensation Normal   Tender-trigger points, bands, spasms None         PFM Contraction Ability  MMT 1+ to 2-/5 all superficial muscle   Voluntary Relax Good   Endurance (sec) 5   # Quick contractions in 10 sec Unable to fully relax between contraction   Elevation ABSENT   Co-contraction ABSENT- was able to perform after training, min TrA activation       Rectal assessment: good squeeze and lift with contraction                                   Good lengthening of PFM with bearing down- increased activation of rectal   sphincter    Verbal consent for  internal assessment obtained      Activity Date: 08/2023 08/29/23 09/04/23       Visit #  1  Initial  eval 2 3      Breathing + PFM + TrA 5-10 breaths, various positions throughout day Practiced with internal rectal exam    Breathing+ lengthening PFM for bowel movements;    Attic, 1st floor, basement Reviewed in supine and seated      360 breathing   Various positions throughouthe day      Cat cow or pelvic tilts   X 10      LTR    X 10 each direction                     CPT code  Daily treatment record Treatment Time               PT Evaluation: 97001 Initial Evaluation    Self care:     Therapeutic Exercise: 97110     Therapeutic Activities: 97530 Pt education in PT eval findings, POC and information in anatomy & diagnosis provided.  - managing bloat: ILU massage, prone laying, TrA activation  -encourage pt to speak with gyn on odor as this can be an issue with PH balance   -reviewed the importance of breathing   -reviewed pushing strategies for bowel movements 18   Manual therapy: 97140 ILU bowel massage- increased time spent cecum and sigmoid colon 15'   Neuromuscular Re-education: [id number] Activities/exercise completed to strengthen core, improve pelvic muscle function and trunk stability and reduce pain in order to achieve goals:    See above for exercises    Internal assessment: not performed this visit    see above; rectal assessment performed today: practiced PFM lengthening with bearing down- tactile cues with tapping to relax PFM    -use of towel roll (seated) or mirror for tactile and visual cues for PFM lengthening   17    Pt response to treatment today:   Good    Next visit:  core strengthening rectal assessment     Total Treatment TIme: 50   PFM - Pelvic Floor Muscles, STM - soft tissue  mobilization,  MFR - myofascial release    Patient Education: 08/29/23: edu pt on external anal sphincter and how contracting when bearing down can contribute to incomplete emptying  -reviewed bowel habits: stool under feet,  fiber 20-30 grams, bowel routine, bowel massage, emptying strategies  - concept of PFM attic, 1st floor, basement and when you are using the muscles in those different functions  08/24/23: -bowel routine: wake up, bowel massage, warm liquid, sit on toilet   -10 min max time on toilet  -no straining  -squatty potty  -importance of fiber: provided pt with fiber handout and recommendation 20-30 grams  -importance of hydration  -daily exercise  -ILU massage  -urgency and how brain bladder connection sometimes need retrained  -how posture impacts bowels      Assessment: Pt has noticed significant improvement in building awareness of pelvic floor muscles and bowel habits which has allowed her to feel more confident in managing her symptoms. She has been sticking with a bowel routine, increasing her fiber intake, reducing straining, performing ILU massage, and using a stool under feet all of which have been helping. She is feeling more confident in lengthening her external rectal sphinctor with bowel movements. This was not checked this visit and plan to recheck NV. Session focused on learning to manage her bloat and engaging TrA to begin core strengthening and help support colon. Pt with good tolerance to this. Continue PT recommended.    Pt is a 78 year old G1P1 female presenting with signs and symptoms consistent with constipation, abdominal pain, UUI  Pt demonstrated impaired motor control of PFM ( 2-) and TA, poor timing of PFMC with increased intra-abdominal pressure, poor postural awareness, and poor knowledge of good bowel and bladder habits.   Pt is a good candidate for physical therapy to address myofascial restrictions, joint hypo-mobility and malalignment, poor postural awareness, decreased core stability and hip strength, and resulting biomechanical and functional impairments. Patient/family received education on the purpose of therapy, participated in the development of the POC and verbalized understanding and  agreement of POC and goals.      Goals of Therapy:      STG Goals - to be achieved in 2 visits:                           Initial Status 08/24/2023   Good knowledge of appropriate voiding intervals, toileting posture, and hygiene Poor Knowledge of appropriate voiding intervals, toileting posture, and hygiene  -discussed toilet position   Demonstrates good knowledge and implementation of self-relaxation, self-correction of posture, self-help / pain management techniques  Poor knowledge of self-relaxation, posture, pain management techniques      Independent in a progressive HEP to address functional limitations No current exercise program   LTG Goals - to be achieved in 10 visits:                           Initial Status 08/24/2023   Increased strength of PFM  to 2+/5 MMT     Endurance 7-10 sec x 10 reps, Quick flicks: 6-8 reps in a 10 sec interval for improved continence/sphincter control, pelvic organ support and pelvic ring stability trunk  PFM Contraction Ability  MMT 1+ to 2-/5 all superficial muscle   Voluntary Relax Good   Endurance (sec) 5   # Quick contractions in 10 sec Unable to fully relax between contraction   Elevation ABSENT  Co-contraction ABSENT- was able to perform after training, min TrA activation      Improved ROM of  lumbar spine/hips    Neutral lumbopelvic alignment Decreased ROM/flexibility of lumbar spine/hips  Lumbopelvic malalignment / postural impairment   Good core support to maintain correct postural align during ex and ADL's          Increase LE strength of glute/hips for improved pelvic ring stability, posture & functional mobility Poor core support / posture alignment     Inability to activate TrA     Decreased LE strengths          Able to utilize urge suppression & defer urination as appropriate.     Pt able to isolate [facility] with Knack maneuver to maintain continence with exertion (cough, lifting, walking, exercise)        Nocturia  2x/night     Urgency - unable to defer urination >5  min  Urinary incontinence with urgency,   Bowel movements every 1-2 days Bowel movements 2-3 x week   Outcome Test PFDI-20          PLAN:  1 x week for 10 weeks    Aimie Wagman, PT, PT, DPT    This note serves as a Discharge Summary should the patient not return to physical therapy per the above plan of care.

## 2023-09-05 ENCOUNTER — Other Ambulatory Visit: Payer: Self-pay | Admitting: Family Medicine

## 2023-09-05 DIAGNOSIS — J90 Pleural effusion, not elsewhere classified: Secondary | ICD-10-CM

## 2023-09-05 DIAGNOSIS — D72820 Lymphocytosis (symptomatic): Secondary | ICD-10-CM

## 2023-09-07 ENCOUNTER — Inpatient Hospital Stay: Payer: Medicare (Managed Care)

## 2023-09-08 ENCOUNTER — Ambulatory Visit
Admission: RE | Admit: 2023-09-08 | Discharge: 2023-09-08 | Disposition: A | Discharge status: Home / Self Care | Payer: Medicare (Managed Care) | Source: Ambulatory Visit | Attending: Family Medicine | Admitting: Family Medicine

## 2023-09-08 ENCOUNTER — Inpatient Hospital Stay: Payer: Medicare (Managed Care) | Attending: Oncology

## 2023-09-08 DIAGNOSIS — J90 Pleural effusion, not elsewhere classified: Secondary | ICD-10-CM | POA: Insufficient documentation

## 2023-09-08 DIAGNOSIS — I4891 Unspecified atrial fibrillation: Secondary | ICD-10-CM | POA: Insufficient documentation

## 2023-09-08 DIAGNOSIS — D509 Iron deficiency anemia, unspecified: Secondary | ICD-10-CM | POA: Insufficient documentation

## 2023-09-08 DIAGNOSIS — D72820 Lymphocytosis (symptomatic): Secondary | ICD-10-CM | POA: Diagnosis present

## 2023-09-08 DIAGNOSIS — C8599 Non-Hodgkin lymphoma, unspecified, extranodal and solid organ sites: Secondary | ICD-10-CM | POA: Insufficient documentation

## 2023-09-08 NOTE — Progress Notes (Signed)
 Patient here for last venofer infusion of cycle today. 6 attempts to obtain peripheral access were unsuccessful. Pt refused any more attempts and said she did not want to reschedule this appointment, but would come back in June as scheduled. Cathie Hoops, MD notified. Pt's transportation was called. Pt stable at discharge.

## 2023-09-11 ENCOUNTER — Encounter: Admit: 2023-09-11 | Discharge: 2023-09-11 | Payer: MEDICARE

## 2023-09-11 DIAGNOSIS — K5902 Outlet dysfunction constipation: Secondary | ICD-10-CM

## 2023-09-11 NOTE — Progress Notes (Signed)
 Diagnosis:   1. Dyssynergic defecation                Referring Provider:Pegram, Casimiro Needle, [employer]    Insurance plan:   Payer/Plan Subscr DOB Sex Relation Sub. Ins. ID Effective Group Num   1. MEDICARE - MEWILSON, SAMPLE Nov 08, 1945 Female Self [id number] 04/06/14                                    [address]   2. GENERIC COMMEHENLEIGH, ROBELLO Jul 20, 1945 Female Self [id number] 03/06/21                                    [facility] [place] [facility]                         # of visits per insurance authorization:08/09/2023/TDC [id number]/[id number]/BENEFIT AUTH INFO  PT,OT, ST -PLAN: MEDICARE A AND B  PT & ST GET [id number] COMBINED PCY USED- $0 POSSIBLE 28 VISITS REMAINING     *ONCE PATIENT REACHES THERAPY CAP IF IT'S MED NEC TO CONTINUE   THERAPIST MUST NOTIFY IV TEAM TO ADD KX MODIFIERS AND     PT/OT/SP-PLAN:  PT. HAS [employer] SUPP[employer]MENT INS PLAN SECONDARY  FOLLOWS MEDICARE GUIDELINES, ONLY PAYS IF MEDICARE PAYS    # of visits per POC: 10   Date of Initial Eval: 08/24/23    Verified Signed POC:    [x]  YES  []   NO   Date: Attestation signed by Kathrine Haddock, CNP at 08/24/2023  2:04 PM     Rerouted to Referral Source for signature via EMR []  Fax []   Mail []    Date:  Routed to PCP (if other than referring) for signature via   EMR []  Fax []    Mail []   Date:    Was POC updated?   []   YES []   NO    Date:        If yes, Verified Signed POC:    []   YES  []   NO   Date:        Precautions: G1P1- cesarean delivery, Carpal tunnel on L; TKA; wrist fx surgery , osteoporosis, TSA    Subjective: 09/11/23: Pt reports she is overall feeling ok. Feels like she is making progress. Still has periods of constipation- where she feels like she needs to go, bloated, but can't get things out. This is better than it was before. Saturday night she had to get up a few times to try. Sunday morning she was able to have a more complete bowel movement. Feels like the massage is helping her manage her bloat.     09/04/23: Pt reports she feels like she is getting the hang  of trying to contract and relax and pelvic floor. Pt is having bowel movements every day to every other day. States after bowel movements is when she generally feels bloated and full. Massage helped a little with this. States still feels like she is having incomplete emptying. She has been noticing an odor from vagina this is concerning to her.    08/29/23: Pt reports she is overall feeling good. Feels like the awareness of her bowel habits has really helped her understand her symptoms. She is now using a stool under her feet for bowel movements. Has stopped straining  and is working on breathing lengthening technique for emptying. She has been having daily bowel movements and sticking with a bowel routine. Also has been doing the bowel massage which really helps.    History of Current Problem: RODERICK SWEEZY is a 78 y.o. female who presents today with chief complaint of bowel dysfunction. This has been present for about 2 years since having L TKA June 2023. Was taking pain medication and feels this is what started it. Previously would have constipation and then 1-2 days of diarrhea. Started a regimen of metamucil/miralax about 1 year ago. Didn't help much. Had a colonoscopy which was fine. Has incomplete emptying most of the time. States she stool is usually formed but not firm. Just doing metamucil every morning at this point. Husband passed away a couple years ago also recently retired- feels like this has changed her habits. Eating breakfast, lunch, dinner- very small amounts; usually not hungry. Pt has Osteoporosis. TSA August 25 2022- currently in PT for this because of a fall she had (pushed over by dog) after surgery. Hx of TKA. Hx of B wrist ORIF.     Objective: Posture:   Absence of TA or spinal ext activation in seated or standing postures    Significant thoracic kyphosis, lumbar lordosis, sway back posture  -spine rotated to L  -hip shifted to R     Abdominal Palpation: TTP R lower quadrant     Diastasis  Recti: Not tested     Breathing: chest dominant     Lumbar ROM                      Flexion 75%   Extension  15%   SB  L 15%; R 25%   Rotation                               LE MMT       Left        Right          Hip flex 4/5 4/5   Hip exten       Hip abd  3/5 4-/5   Hip ER 4-/5 4-/5   Hip IR 4-/5 4-/5                                            LE  ROM       Left        Right          Hip flex 95 95   Hip exten       Hip abd       Hip add       Hip ER  35  20   Hip IR  15  0      Core Strength:       Sahrmann Core Stability Test      Level 0 Unable to achieve level 1 position         Level 1 Begin in supine, hook-lying position while abdominal hollowing. Slowly raise 1 leg to 100 deg of hip flexion with comfortable knee flexion. Bring opposite leg to same position.   Level 2  From hip flexed position, slowly lower 1 leg until heel contacts surface. Slide heel out to fully extend the knee. Return to  starting flexed position.   Level 3  From hip flexed position, slowly lower 1 leg until heel is 12 cm above surface. Slide heel out to fully extend the knee. Return to starting flexed position.   Level 4  From hip flexed position, slowly lower both legs until heel contacts surface.  Slide heel out to fully extend knees. Return to starting flexed position.   Level 5 From hip flexed position, slowly lower both legs until heels are 12 cm above surface. Slide heel out to fully extend knees. Return to starting flexed position.         External Perineal Observation  Voluntary contraction PFM PRESENT   Normal Involuntary contraction (with cough) ABSENT   Perineal descent with bearing down / PFM mobility No muscle lengthening- increased intraabdominal pressure   Scarring / General skin condition None      Internal Pelvic Floor Palpation  PFM tone Normal   Muscle Volume Normal   Sensation Normal   Tender-trigger points, bands, spasms None         PFM Contraction Ability  MMT 1+ to 2-/5 all superficial muscle   Voluntary Relax Good    Endurance (sec) 5   # Quick contractions in 10 sec Unable to fully relax between contraction   Elevation ABSENT   Co-contraction ABSENT- was able to perform after training, min TrA activation       Rectal assessment: good squeeze and lift with contraction                                   Good lengthening of PFM with bearing down- increased activation of rectal   sphincter    Verbal consent for internal assessment obtained      Activity Date: 08/2023 08/29/23 09/04/23 09/11/23      Visit #  1  Initial  eval 2 3 4      Breathing + PFM + TrA 5-10 breaths, various positions throughout day Practiced with internal rectal exam    Breathing+ lengthening PFM for bowel movements;    Attic, 1st floor, basement Reviewed in supine and seated Breathing + TrA + PFM x 10 breaths - cues to reduce upper abdominal compensation    TrA marches; x10     360 breathing   Various positions throughouthe day Reviewed     Cat cow or pelvic tilts   X 10 Reviewed     LTR    X 10 each direction Reviewed                    CPT code  Daily treatment record Treatment Time               PT Evaluation: 97001 Initial Evaluation    Self care:     Therapeutic Exercise: 97110     Therapeutic Activities: 97530 Pt education in PT eval findings, POC and information in anatomy & diagnosis provided.  -discussed metamucil vs miralax- pt taking metamucil daily;   -edu pt on probiotics  -managing bloat: massage, prone lying  -importance of daily exercises- return to gym- Medicare will pay for this 15   Manual therapy: 97140 ILU bowel massage- increased time spent cecum and sigmoid colon 16'   Neuromuscular Re-education: [phone] Activities/exercise completed to strengthen core, improve pelvic muscle function and trunk stability and reduce pain in order to achieve goals:    See above for exercises  Internal assessment:     EXTERNAL RECTAL ASSESSMENT PERFORM: assesses ability to contract, relax, bear down; practiced exhale and lengthening- good downward movement    see  above; rectal assessment performed today: practiced PFM lengthening with bearing down- tactile cues with tapping to relax PFM    -use of towel roll (seated) or mirror for tactile and visual cues for PFM lengthening   23    Pt response to treatment today:   Good    Next visit:  core strengthening     Total Treatment TIme: 54   PFM - Pelvic Floor Muscles, STM - soft tissue mobilization,  MFR - myofascial release    Patient Education: 09/04/23: - managing bloat: ILU massage, prone laying, TrA activation  -encourage pt to speak with gyn on odor as this can be an issue with PH balance   -reviewed the importance of breathing   -reviewed pushing strategies for bowel movements  08/29/23: edu pt on external anal sphincter and how contracting when bearing down can contribute to incomplete emptying  -reviewed bowel habits: stool under feet, fiber 20-30 grams, bowel routine, bowel massage, emptying strategies  - concept of PFM attic, 1st floor, basement and when you are using the muscles in those different functions  08/24/23: -bowel routine: wake up, bowel massage, warm liquid, sit on toilet   -10 min max time on toilet  -no straining  -squatty potty  -importance of fiber: provided pt with fiber handout and recommendation 20-30 grams  -importance of hydration  -daily exercise  -ILU massage  -urgency and how brain bladder connection sometimes need retrained  -how posture impacts bowels      Assessment: Pt has noticed significant improvement in building awareness of pelvic floor muscles and bowel habits which has allowed her to feel more confident in managing her symptoms. She has been sticking with a bowel routine, increasing her fiber intake, reducing straining, performing ILU massage, and using a stool under feet all of which have been helping. Assessed her ability to lengthen PFM this visit with more efficient ability to do so allowing for more complete emptying. Discussed probiotics and how this can help with gut health. Also  discussed the ability to use miralax PRN for periods of constipation. Initiated TrA training as pt tends to compensate with upper abdominals. Continue PT recommended.    Pt is a 78 year old G1P1 female presenting with signs and symptoms consistent with constipation, abdominal pain, UUI  Pt demonstrated impaired motor control of PFM ( 2-) and TA, poor timing of PFMC with increased intra-abdominal pressure, poor postural awareness, and poor knowledge of good bowel and bladder habits.   Pt is a good candidate for physical therapy to address myofascial restrictions, joint hypo-mobility and malalignment, poor postural awareness, decreased core stability and hip strength, and resulting biomechanical and functional impairments. Patient/family received education on the purpose of therapy, participated in the development of the POC and verbalized understanding and agreement of POC and goals.      Goals of Therapy:      STG Goals - to be achieved in 2 visits:                           Initial Status 08/24/2023   Good knowledge of appropriate voiding intervals, toileting posture, and hygiene Poor Knowledge of appropriate voiding intervals, toileting posture, and hygiene  -discussed toilet position   Demonstrates good knowledge and implementation of self-relaxation, self-correction of posture, self-help /  pain management techniques  Poor knowledge of self-relaxation, posture, pain management techniques      Independent in a progressive HEP to address functional limitations No current exercise program   LTG Goals - to be achieved in 10 visits:                           Initial Status 08/24/2023   Increased strength of PFM  to 2+/5 MMT     Endurance 7-10 sec x 10 reps, Quick flicks: 6-8 reps in a 10 sec interval for improved continence/sphincter control, pelvic organ support and pelvic ring stability trunk  PFM Contraction Ability  MMT 1+ to 2-/5 all superficial muscle   Voluntary Relax Good   Endurance (sec) 5   # Quick  contractions in 10 sec Unable to fully relax between contraction   Elevation ABSENT   Co-contraction ABSENT- was able to perform after training, min TrA activation      Improved ROM of  lumbar spine/hips    Neutral lumbopelvic alignment Decreased ROM/flexibility of lumbar spine/hips  Lumbopelvic malalignment / postural impairment   Good core support to maintain correct postural align during ex and ADL's          Increase LE strength of glute/hips for improved pelvic ring stability, posture & functional mobility Poor core support / posture alignment     Inability to activate TrA     Decreased LE strengths          Able to utilize urge suppression & defer urination as appropriate.     Pt able to isolate [facility] with Knack maneuver to maintain continence with exertion (cough, lifting, walking, exercise)        Nocturia  2x/night     Urgency - unable to defer urination >5 min  Urinary incontinence with urgency,   Bowel movements every 1-2 days Bowel movements 2-3 x week   Outcome Test PFDI-20          PLAN:  1 x week for 10 weeks    Analeya Luallen, PT, PT, DPT    This note serves as a Discharge Summary should the patient not return to physical therapy per the above plan of care.

## 2023-09-15 ENCOUNTER — Other Ambulatory Visit: Payer: Self-pay

## 2023-09-15 ENCOUNTER — Encounter: Payer: Self-pay | Admitting: *Deleted

## 2023-09-15 ENCOUNTER — Emergency Department
Admission: EM | Admit: 2023-09-15 | Discharge: 2023-09-15 | Disposition: A | Discharge status: Home / Self Care | Payer: Medicare (Managed Care) | Attending: Emergency Medicine | Admitting: Emergency Medicine

## 2023-09-15 ENCOUNTER — Emergency Department: Payer: Medicare (Managed Care)

## 2023-09-15 DIAGNOSIS — R0602 Shortness of breath: Secondary | ICD-10-CM | POA: Diagnosis present

## 2023-09-15 DIAGNOSIS — I4891 Unspecified atrial fibrillation: Secondary | ICD-10-CM | POA: Diagnosis not present

## 2023-09-15 DIAGNOSIS — D509 Iron deficiency anemia, unspecified: Secondary | ICD-10-CM | POA: Diagnosis present

## 2023-09-15 DIAGNOSIS — C8599 Non-Hodgkin lymphoma, unspecified, extranodal and solid organ sites: Secondary | ICD-10-CM | POA: Diagnosis not present

## 2023-09-15 DIAGNOSIS — R079 Chest pain, unspecified: Secondary | ICD-10-CM

## 2023-09-15 LAB — CBC WITH DIFFERENTIAL/PLATELET
Abs Immature Granulocytes: 0.02 10*3/uL (ref 0.00–0.07)
Basophils Absolute: 0 10*3/uL (ref 0.0–0.1)
Basophils Relative: 0 %
Eosinophils Absolute: 0.1 10*3/uL (ref 0.0–0.5)
Eosinophils Relative: 1 %
HCT: 39.6 % (ref 36.0–46.0)
Hemoglobin: 12.2 g/dL (ref 12.0–15.0)
Immature Granulocytes: 0 %
Lymphocytes Relative: 25 %
Lymphs Abs: 1.6 10*3/uL (ref 0.7–4.0)
MCH: 25.4 pg — ABNORMAL LOW (ref 26.0–34.0)
MCHC: 30.8 g/dL (ref 30.0–36.0)
MCV: 82.5 fL (ref 80.0–100.0)
Monocytes Absolute: 0.4 10*3/uL (ref 0.1–1.0)
Monocytes Relative: 6 %
Neutro Abs: 4.3 10*3/uL (ref 1.7–7.7)
Neutrophils Relative %: 68 %
Platelets: 143 10*3/uL — ABNORMAL LOW (ref 150–400)
RBC: 4.8 MIL/uL (ref 3.87–5.11)
RDW: 21.7 % — ABNORMAL HIGH (ref 11.5–15.5)
Smear Review: NORMAL
WBC: 6.4 10*3/uL (ref 4.0–10.5)
nRBC: 0 % (ref 0.0–0.2)

## 2023-09-15 LAB — COMPREHENSIVE METABOLIC PANEL WITH GFR
ALT: 14 U/L (ref 0–44)
AST: 25 U/L (ref 15–41)
Albumin: 3.9 g/dL (ref 3.5–5.0)
Alkaline Phosphatase: 39 U/L (ref 38–126)
Anion gap: 12 (ref 5–15)
BUN: 18 mg/dL (ref 8–23)
CO2: 25 mmol/L (ref 22–32)
Calcium: 9 mg/dL (ref 8.9–10.3)
Chloride: 102 mmol/L (ref 98–111)
Creatinine, Ser: 0.84 mg/dL (ref 0.44–1.00)
GFR, Estimated: >60 mL/min (ref >60)
Glucose, Bld: 184 mg/dL — ABNORMAL HIGH (ref 70–99)
Potassium: 3.5 mmol/L (ref 3.5–5.1)
Sodium: 139 mmol/L (ref 135–145)
Total Bilirubin: 0.6 mg/dL (ref 0.0–1.2)
Total Protein: 7.5 g/dL (ref 6.5–8.1)

## 2023-09-15 LAB — TROPONIN I (HIGH SENSITIVITY)
Troponin I (High Sensitivity): 9 ng/L (ref <18)
Troponin I (High Sensitivity): 13 ng/L (ref <18)

## 2023-09-15 LAB — BRAIN NATRIURETIC PEPTIDE: B Natriuretic Peptide: 308 pg/mL — ABNORMAL HIGH (ref 0.0–100.0)

## 2023-09-15 MED ORDER — LORAZEPAM 2 MG/ML IJ SOLN
0.5000 mg | Freq: Once | INTRAMUSCULAR | Status: AC
  Administered 2023-09-15: 0.5 mg via INTRAVENOUS
  Filled 2023-09-15: qty 1

## 2023-09-15 MED ORDER — LIDOCAINE VISCOUS HCL 2 % MT SOLN
15.0000 mL | Freq: Once | OROMUCOSAL | Status: AC
  Administered 2023-09-15: 15 mL via ORAL
  Filled 2023-09-15: qty 15

## 2023-09-15 MED ORDER — LIDOCAINE VISCOUS HCL 2 % MT SOLN: 15.0000 mL | OROMUCOSAL | 0 refills | Status: DC | PRN

## 2023-09-15 MED ORDER — ALUM & MAG HYDROXIDE-SIMETH 200-200-20 MG/5ML PO SUSP
30.0000 mL | Freq: Once | ORAL | Status: AC
  Administered 2023-09-15: 30 mL via ORAL
  Filled 2023-09-15: qty 30

## 2023-09-15 NOTE — ED Provider Notes (Signed)
 [facility] Provider Note    Event Date/Time   First MD Initiated Contact with Patient 09/15/23 1708     (approximate)   History   Chest Pain   HPI  [name] is a 78 y.o. female who presents to the emergency department today because of concerns for fast heart rate and chest pain.  Patient has a history of atrial fibrillation.  The patient states that she was sitting down watching TV when she felt like her heart was going fast.  When she checked it she got rates over 200.  This was accompanied by some left sided chest pain.  It is sharp.  Was accompanied by shortness of breath. The patient denies any recent illness.  Denies any fevers.     Physical Exam   Triage Vital Signs: ED Triage Vitals  Encounter Vitals Group     BP 09/15/23 1710 119/64     Systolic BP Percentile --      Diastolic BP Percentile --      Pulse Rate 09/15/23 1710 (!) 114     Resp 09/15/23 1715 (!) 27     Temp 09/15/23 1724 99 F (37.2 C)     Temp Source 09/15/23 1724 Oral     SpO2 09/15/23 1714 97 %     Weight 09/15/23 1721 146 lb (66.2 kg)     Height --      Head Circumference --      Peak Flow --      Pain Score 09/15/23 1721 0     Pain Loc --      Pain Education --      Exclude from Growth Chart --     Most recent vital signs: Vitals:   09/15/23 1735 09/15/23 1740  BP:    Pulse:    Resp: (!) 28 (!) 33  Temp:    SpO2:     General: Awake, alert, oriented. CV:  Good peripheral perfusion. Irregularly irregular rhythm. Resp:  Normal effort. Lungs clear. Abd:  No distention.    ED Results / Procedures / Treatments   Labs (all labs ordered are listed, but only abnormal results are displayed) Labs Reviewed  COMPREHENSIVE METABOLIC PANEL WITH GFR - Abnormal; Notable for the following components:      Result Value   Glucose, Bld 184 (*)    All other components within normal limits  CBC WITH DIFFERENTIAL/PLATELET - Abnormal; Notable for the  following components:   MCH 25.4 (*)    RDW 21.7 (*)    Platelets 143 (*)    All other components within normal limits  BRAIN NATRIURETIC PEPTIDE - Abnormal; Notable for the following components:   B Natriuretic Peptide 308.0 (*)    All other components within normal limits  TROPONIN I (HIGH SENSITIVITY)  TROPONIN I (HIGH SENSITIVITY)     EKG  I, Phineas Semen, attending physician, personally viewed and interpreted this EKG  EKG Time: 1715 Rate: 123 Rhythm: atrial fibrillation Axis: right axis deviation Intervals: qtc 500 QRS: RBBB, LPFB ST changes: no st elevation Impression: abnormal ekg  I, Phineas Semen, attending physician, personally viewed and interpreted this EKG  EKG Time: 2210 Rate: 73 Rhythm: sinus rhythm Axis: left axis deviation Intervals: qtc 518 QRS: RBBB ST changes: no st elevation Impression: abnormal ekg   RADIOLOGY I independently interpreted and visualized the CXR. My interpretation: No pneumonia Radiology interpretation:  IMPRESSION:  No acute cardiopulmonary findings.      PROCEDURES:  Critical Care performed: No    MEDICATIONS ORDERED IN ED: Medications - No data to display   IMPRESSION / MDM / ASSESSMENT AND PLAN / ED COURSE  I reviewed the triage vital signs and the nursing notes.                              Differential diagnosis includes, but is not limited to, arrhythmia, ACS, pneumonia, pneumothorax  Patient's presentation is most consistent with acute presentation with potential threat to life or bodily function.   The patient is on the cardiac monitor to evaluate for evidence of arrhythmia and/or significant heart rate changes.  Patient presented to the emergency department today because of concerns for fast heart rate and chest pain.  On exam patient's heart rate is slightly tachycardic and irregular.  Lungs are clear.  EKG without any ST elevation.  Will check blood work and chest x-ray.  Blood work without  any concerning troponin elevation or electrolyte abnormalities.  Chest x-ray without pneumonia.  Patient did feel better after medication here.  Additionally while she was here the patient converted back to sinus rhythm.  At this time given reassuring blood work and that the patient is back in sinus rhythm I think it is reasonable for patient be discharged home.  I discussed findings with the patient.  Encourage patient to follow-up with her primary care.      FINAL CLINICAL IMPRESSION(S) / ED DIAGNOSES   Final diagnoses:  Nonspecific chest pain  Atrial fibrillation, unspecified type [facility])     Note:  This document was prepared using Dragon voice recognition software and may include unintentional dictation errors.    Phineas Semen, MD 09/15/23 2233

## 2023-09-15 NOTE — ED Triage Notes (Signed)
 BIB [facility] from home for CP, onset 1400, seen at home by [facility], given ntg x2 SL with relief. Denies pain on arrival. Arrives anxious/ emotional. VSS. EKG ST and afib 90-150. Alert, NAD, interactive. H/o chronic anemia, blood thinner d/c'd 2/2 anemia, takes med for afib, unsteady on feet, uses cane. NSL R post. wrist. Pleural effusion 6 wks ago.

## 2023-09-15 NOTE — ED Notes (Signed)
 CCMD Contacted

## 2023-09-15 NOTE — ED Notes (Signed)
 C/o return of CP, worse than before. EKG repeated and given to EDP

## 2023-09-17 ENCOUNTER — Emergency Department
Admission: EM | Admit: 2023-09-17 | Discharge: 2023-09-17 | Disposition: A | Discharge status: Home / Self Care | Payer: Medicare (Managed Care) | Attending: Emergency Medicine | Admitting: Emergency Medicine

## 2023-09-17 ENCOUNTER — Other Ambulatory Visit: Payer: Self-pay

## 2023-09-17 ENCOUNTER — Emergency Department: Payer: Medicare (Managed Care)

## 2023-09-17 DIAGNOSIS — D509 Iron deficiency anemia, unspecified: Secondary | ICD-10-CM | POA: Diagnosis not present

## 2023-09-17 DIAGNOSIS — I4891 Unspecified atrial fibrillation: Secondary | ICD-10-CM | POA: Diagnosis not present

## 2023-09-17 DIAGNOSIS — R0602 Shortness of breath: Secondary | ICD-10-CM | POA: Diagnosis present

## 2023-09-17 DIAGNOSIS — Z7901 Long term (current) use of anticoagulants: Secondary | ICD-10-CM | POA: Insufficient documentation

## 2023-09-17 LAB — COMPREHENSIVE METABOLIC PANEL WITH GFR
ALT: 13 U/L (ref 0–44)
AST: 18 U/L (ref 15–41)
Albumin: 3.8 g/dL (ref 3.5–5.0)
Alkaline Phosphatase: 38 U/L (ref 38–126)
Anion gap: 10 (ref 5–15)
BUN: 20 mg/dL (ref 8–23)
CO2: 26 mmol/L (ref 22–32)
Calcium: 9.2 mg/dL (ref 8.9–10.3)
Chloride: 102 mmol/L (ref 98–111)
Creatinine, Ser: 0.79 mg/dL (ref 0.44–1.00)
GFR, Estimated: >60 mL/min (ref >60)
Glucose, Bld: 130 mg/dL — ABNORMAL HIGH (ref 70–99)
Potassium: 3.5 mmol/L (ref 3.5–5.1)
Sodium: 138 mmol/L (ref 135–145)
Total Bilirubin: 0.7 mg/dL (ref 0.0–1.2)
Total Protein: 7.4 g/dL (ref 6.5–8.1)

## 2023-09-17 LAB — CBC WITH DIFFERENTIAL/PLATELET
Abs Immature Granulocytes: 0.02 10*3/uL (ref 0.00–0.07)
Basophils Absolute: 0 10*3/uL (ref 0.0–0.1)
Basophils Relative: 1 %
Eosinophils Absolute: 0.1 10*3/uL (ref 0.0–0.5)
Eosinophils Relative: 1 %
HCT: 38.5 % (ref 36.0–46.0)
Hemoglobin: 12.1 g/dL (ref 12.0–15.0)
Immature Granulocytes: 0 %
Lymphocytes Relative: 27 %
Lymphs Abs: 1.6 10*3/uL (ref 0.7–4.0)
MCH: 25.7 pg — ABNORMAL LOW (ref 26.0–34.0)
MCHC: 31.4 g/dL (ref 30.0–36.0)
MCV: 81.7 fL (ref 80.0–100.0)
Monocytes Absolute: 0.4 10*3/uL (ref 0.1–1.0)
Monocytes Relative: 7 %
Neutro Abs: 3.8 10*3/uL (ref 1.7–7.7)
Neutrophils Relative %: 64 %
Platelets: 125 10*3/uL — ABNORMAL LOW (ref 150–400)
RBC: 4.71 MIL/uL (ref 3.87–5.11)
RDW: 21.8 % — ABNORMAL HIGH (ref 11.5–15.5)
Smear Review: NORMAL
WBC: 5.9 10*3/uL (ref 4.0–10.5)
nRBC: 0 % (ref 0.0–0.2)

## 2023-09-17 LAB — TROPONIN I (HIGH SENSITIVITY)
Troponin I (High Sensitivity): 8 ng/L (ref <18)
Troponin I (High Sensitivity): 8 ng/L (ref <18)

## 2023-09-17 LAB — MAGNESIUM: Magnesium: 1.9 mg/dL (ref 1.7–2.4)

## 2023-09-17 LAB — BRAIN NATRIURETIC PEPTIDE: B Natriuretic Peptide: 572.3 pg/mL — ABNORMAL HIGH (ref 0.0–100.0)

## 2023-09-17 LAB — RESP PANEL BY RT-PCR (RSV, FLU A&B, COVID)  RVPGX2
Influenza A by PCR: NEGATIVE
Influenza B by PCR: NEGATIVE
Resp Syncytial Virus by PCR: NEGATIVE
SARS Coronavirus 2 by RT PCR: NEGATIVE

## 2023-09-17 MED ORDER — FUROSEMIDE 10 MG/ML IJ SOLN
40.0000 mg | Freq: Once | INTRAMUSCULAR | Status: AC
  Administered 2023-09-17: 40 mg via INTRAVENOUS
  Filled 2023-09-17: qty 4

## 2023-09-17 NOTE — ED Triage Notes (Signed)
 Pt to ED from home, [facility] for a fib and SOB since  Seen here 2 days ago for same  HR was 120-150, now 90-120 (no meds given) 95% RA, RR 20, CBG 128  EDP at bedside  Pt takes amiodarone, Xanax, buspirone. Hx cirrhosis, a fib, COPD, anxiety, lymphoma  L arm restricted

## 2023-09-17 NOTE — ED Notes (Signed)
 Attempting to call son Gill Lace for ride for pt.

## 2023-09-17 NOTE — ED Notes (Signed)
 Son at bedside.

## 2023-09-17 NOTE — ED Notes (Signed)
 Pt sitting on toilet and crying.

## 2023-09-17 NOTE — ED Notes (Signed)
 RN called Codie, Hainer (Son) [phone] [facility] Phone). No answer.

## 2023-09-17 NOTE — ED Notes (Signed)
 Pt stating she is upset she will be discharged, wants to talk to EDP again (EDP did have long conversation with pt about situation/findings/plan), asked for food but refused the food provided, then stating "you're just trying to get rid of me" when informed her that per CN we will need to move her to hall bed while waiting to go home. Will call son to get ride home. Pt is ambulatory and will not qualify for [facility] transport.

## 2023-09-17 NOTE — Discharge Instructions (Addendum)
 We placed a referral for cardiology and asked them to reach out to your son to help schedule this appointment.  Continue your medications including amiodarone and Lasix.  If you develop any worsening symptoms such as chest pain, shortness of breath or other concerns then please return to the ED

## 2023-09-17 NOTE — ED Provider Notes (Signed)
 [facility] Provider Note    Event Date/Time   First MD Initiated Contact with Patient 09/17/23 [phone]     (approximate)   History   Shortness of Breath and Atrial Fibrillation   HPI  Erin Virginia  [name] is a 78 y.o. female who presents to the ED for evaluation of Shortness of Breath and Atrial Fibrillation   I reviewed ED visit from 2 days ago where she was seen for chest discomfort, tachycardia.  Thought to be symptomatic A-fib that self resolves and converts back to a sinus rhythm, otherwise benign workup and discharged back to facility.  Anticoagulated on Eliquis. Also review a pulmonary clinic visit from 3 months ago.  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 2 L supplemental oxygen as needed in the daytime,   Patient presents to the ED with concerns for fluctuating heart rates ranging 30s-200s when she is checking her heart rates multiple times per day on a finger pulse oximeter.  Reports fear that her heart will stop when she tries to sleep at night when she tries to go to bed in the 30s.  She reports chronic right-sided flank discomfort where her Pleurx catheter was originally placed.  Denies any acute pain or dyspnea right now.  No cough, fevers, emesis, abdominal pain.  Primarily requesting answers about her fluctuating heart rates.   Physical Exam   Triage Vital Signs: ED Triage Vitals  Encounter Vitals Group     BP      Systolic BP Percentile      Diastolic BP Percentile      Pulse      Resp      Temp      Temp src      SpO2      Weight      Height      Head Circumference      Peak Flow      Pain Score      Pain Loc      Pain Education      Exclude from Growth Chart     Most recent vital signs: Vitals:   09/17/23 0900 09/17/23 0945  BP:    Pulse: (!) 127 (!) 105  Resp: 17 19  Temp:    SpO2: 100% 99%    General: Awake, no  distress.  Anxious, well-appearing, conversational. CV:  Good peripheral perfusion.  Irregular, A-fib on the monitor, rates ranging 80s-110s. Resp:  Normal effort.  No wheezing Abd:  No distention.  Soft MSK:  No deformity noted.  Neuro:  No focal deficits appreciated. Other:     ED Results / Procedures / Treatments   Labs (all labs ordered are listed, but only abnormal results are displayed) Labs Reviewed  COMPREHENSIVE METABOLIC PANEL WITH GFR - Abnormal; Notable for the following components:      Result Value   Glucose, Bld 130 (*)    All other components within normal limits  CBC WITH DIFFERENTIAL/PLATELET - Abnormal; Notable for the following components:   MCH 25.7 (*)    RDW 21.8 (*)    Platelets 125 (*)    All other components within normal limits  BRAIN NATRIURETIC PEPTIDE - Abnormal; Notable for the following components:   B Natriuretic Peptide 572.3 (*)    All other components within normal limits  RESP PANEL BY RT-PCR (RSV, FLU A&B, COVID)  RVPGX2  MAGNESIUM  TROPONIN  I (HIGH SENSITIVITY)  TROPONIN I (HIGH SENSITIVITY)    EKG A-fib with RVR, rate 123 bpm.  Right bundle morphology without clear signs of acute ischemia.  Tremulous baseline.  RADIOLOGY CXR interpreted by me without evidence of acute cardiopulmonary pathology.  Official radiology report(s): DG Chest Portable 1 View Result Date: 09/17/2023 CLINICAL DATA:  Short of breath, pleural effusions EXAM: PORTABLE CHEST 1 VIEW COMPARISON:  04/16/2024 FINDINGS: Normal cardiac silhouette. Calcified mitral valve. Cardiac recording device again noted. Chronic bronchitic markings centrally. No effusion, infiltrate, or pneumothorax. IMPRESSION: 1. No acute cardiopulmonary process. 2. Chronic bronchitic markings. Electronically Signed   By: Deboraha Fallow M.D.   On: 09/17/2023 09:22    PROCEDURES and INTERVENTIONS:  .1-3 Lead EKG Interpretation  Performed by: Arline Bennett, MD Authorized by: Arline Bennett, MD      Interpretation: abnormal     ECG rate:  106   ECG rate assessment: tachycardic     Rhythm: atrial fibrillation     Ectopy: none     Conduction: normal     Medications  furosemide (LASIX) injection 40 mg (40 mg Intravenous Given 09/17/23 1037)     IMPRESSION / MDM / ASSESSMENT AND PLAN / ED COURSE  I reviewed the triage vital signs and the nursing notes.  Differential diagnosis includes, but is not limited to, ACS, PTX, PNA, muscle strain/spasm, PE, dissection, anxiety, pleural effusion  {Patient presents with symptoms of an acute illness or injury that is potentially life-threatening.  Patient with known paroxysmal A-fib presents with concerns for labile heart rates but asymptomatic.  She has signs of a mild CHF exacerbation with an elevated BNP for which she received a one-time dose of furosemide IV.  Her rapid rates improved with this, remaining 90s-100s and she reports feeling better.  Her primary concern is why her heart rate is more labile.  While I considered observation admission for this patient I believe she be suitable for outpatient cardiology follow-up.  She is normal electrolytes, negative troponins and looks well.  Clinical Course as of 09/17/23 1128  Sun Sep 17, 2023  0930 Reassessed and discussed plan of care. [DS]  1116 Reassessed.  Patient reports feeling better.  We discussed signs of a mild CHF exacerbation overall reassuring. [DS]    Clinical Course User Index [DS] Arline Bennett, MD     FINAL CLINICAL IMPRESSION(S) / ED DIAGNOSES   Final diagnoses:  Atrial fibrillation with RVR (HCC)     Rx / DC Orders   ED Discharge Orders          Ordered    Ambulatory referral to Cardiology       Comments: If you have not heard from the Cardiology office within the next 72 hours please call [phone].   09/17/23 1125             Note:  This document was prepared using Dragon voice recognition software and may include unintentional dictation errors.    Arline Bennett, MD 09/17/23 [phone]

## 2023-09-17 NOTE — ED Notes (Signed)
 Family walked into ED. RN at bedside reviewing discharge information. EDP at bedside speaking with patient and family about discharge.

## 2023-09-17 NOTE — ED Notes (Signed)
 Emergency dept. Provider at bedside talking with patient.

## 2023-09-18 ENCOUNTER — Encounter: Payer: Self-pay | Admitting: Oncology

## 2023-09-20 ENCOUNTER — Ambulatory Visit: Payer: Medicare (Managed Care) | Attending: Cardiology | Admitting: Cardiology

## 2023-09-20 ENCOUNTER — Encounter: Payer: Self-pay | Admitting: Cardiology

## 2023-09-20 VITALS — BP 136/86 | HR 119 | Ht 60.0 in | Wt 144.0 lb

## 2023-09-20 DIAGNOSIS — I10 Essential (primary) hypertension: Secondary | ICD-10-CM

## 2023-09-20 DIAGNOSIS — I4891 Unspecified atrial fibrillation: Secondary | ICD-10-CM | POA: Diagnosis not present

## 2023-09-20 MED ORDER — AMIODARONE HCL 200 MG PO TABS
200.0000 mg | ORAL_TABLET | Freq: Every day | ORAL | 3 refills | Status: DC
Start: 2023-09-20 — End: 2024-04-30

## 2023-09-20 MED ORDER — METOPROLOL TARTRATE 25 MG PO TABS: 25.0000 mg | ORAL_TABLET | Freq: Two times a day (BID) | ORAL | 3 refills | Status: DC

## 2023-09-20 NOTE — Progress Notes (Signed)
 Cardiology Office Note:    Date:  09/20/2023   ID:  Erin Jung [name]  [name], [name] 1945-07-19, MRN [id number]  PCP:  [facility]   [facility] Providers Cardiologist:  None     Referring MD: Arline Bennett, MD   No chief complaint on file.   History of Present Illness:    [name]  [name] is a 78 y.o. 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  Admitted to the hospital 04/2023 after a mechanical fall.  She slipped and fell sustaining sacral fracture and scalp hematoma.  Has also fallen previously.  Was previously on Eliquis, this was stopped after fall.  Also has anemia being managed by oncology with iron infusions.  Eliquis 2.5 mg twice daily was started 2 days ago.  Patient has not had any bleeding issues so far.  States having palpitations yesterday and this morning.  Heart rates up to 140 bpm.  Takes amiodarone 100 mg daily, had thyroid gland was previously removed due to concerns for thyroid cancer.  Echocardiogram 04/2023 EF 55 to 60%.   Past Medical History:  Diagnosis Date   A-fib (HCC)    Anemia    Anxiety    Arthritis    Asthma    Cirrhosis (HCC)    COPD (chronic obstructive pulmonary disease) (HCC)    Diabetes mellitus without complication (HCC)    type 2   DVT (deep venous thrombosis) (HCC) 1980   GERD (gastroesophageal reflux disease)    History of hiatal hernia    Hypertension    Hypothyroidism    Lymphoma (HCC)     Past Surgical History:  Procedure Laterality Date   CATARACT EXTRACTION Bilateral    CHEST TUBE INSERTION Right 05/29/2023   Procedure: INSERTION PLEURAL DRAINAGE CATHETER;  Surgeon: Hilarie Lovely, MD;  Location: [facility] OR;  Service: Thoracic;  Laterality: Right;   CHOLECYSTECTOMY     COLONOSCOPY     ESOPHAGOGASTRODUODENOSCOPY (EGD) WITH PROPOFOL N/A 01/17/2022   Procedure: ESOPHAGOGASTRODUODENOSCOPY (EGD) WITH PROPOFOL;   Surgeon: Quintin Buckle, DO;  Location: [facility] ENDOSCOPY;  Service: Gastroenterology;  Laterality: N/A;   HUMERUS SURGERY Left    fracture (7 surgeries total)   IR BONE MARROW BIOPSY & ASPIRATION  04/26/2023   LOOP RECORDER INSERTION  10/02/2016   pt states battery died, not transmitting, just there   MENISCUS REPAIR Right 02-Oct-2017   REMOVAL OF PLEURAL DRAINAGE CATHETER Right 07/05/2023   Procedure: REMOVAL OF PLEURAL DRAINAGE CATHETER;  Surgeon: Annitta Kindler, MD;  Location: [facility] ORS;  Service: Pulmonary;  Laterality: Right;   THYROIDECTOMY     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; reconstruction from hip bone   TONSILECTOMY, ADENOIDECTOMY, BILATERAL MYRINGOTOMY AND TUBES     TOTAL KNEE ARTHROPLASTY Right 03/14/2022   Procedure: TOTAL KNEE ARTHROPLASTY;  Surgeon: Venus Ginsberg, MD;  Location: [facility] ORS;  Service: Orthopedics;  Laterality: Right;   TOTAL SHOULDER REPLACEMENT Left    TUBAL LIGATION     VIDEO ASSISTED THORACOSCOPY Right 05/29/2023   Procedure: RIGHT VIDEO ASSISTED THORACOSCOPY FOR PLEURAL BIOPSY;  Surgeon: Hilarie Lovely, MD;  Location: [facility] OR;  Service: Thoracic;  Laterality: Right;   WRIST SURGERY Left    multiple fractures from falls (14 surgeries from wrist to shoulder)    Current Medications: Current Meds  Medication Sig   amiodarone (PACERONE) 200 MG tablet Take 1 tablet (200 mg total) by mouth daily.  ELIQUIS 2.5 MG TABS tablet Take 2.5 mg by mouth 2 (two) times daily.   escitalopram (LEXAPRO) 10 MG tablet Take 1 tablet (10 mg total) by mouth daily.   esomeprazole (NEXIUM) 40 MG capsule Take 40 mg by mouth 2 (two) times daily.   ferrous sulfate 325 (65 FE) MG tablet Take 325 mg by mouth 3 (three) times a week. Monday, Wednesday, Friday   furosemide (LASIX) 40 MG tablet Take 40 mg by mouth daily.   levothyroxine (SYNTHROID) 125 MCG tablet Take 125 mcg by mouth daily before breakfast.   metoprolol tartrate (LOPRESSOR) 25 MG tablet Take 1 tablet (25 mg total)  by mouth 2 (two) times daily.   potassium chloride (KLOR-CON) 10 MEQ tablet Take 10 mEq by mouth daily.   rOPINIRole (REQUIP) 0.25 MG tablet Take 0.25 mg by mouth at bedtime.   senna (SENOKOT) 8.6 MG TABS tablet Take 2 tablets by mouth in the morning and at bedtime.   traZODone (DESYREL) 100 MG tablet Take 100 mg by mouth at bedtime.   [DISCONTINUED] amiodarone (PACERONE) 100 MG tablet Take 1 tablet (100 mg total) by mouth daily.     Allergies:   Cephalosporins, Codeine, Contrast media [iodinated contrast media], and Oxycodone   Social History   Socioeconomic History   Marital status: Widowed    Spouse name: Not on file   Number of children: 6   Years of education: Not on file   Highest education level: Not on file  Occupational History   Not on file  Tobacco Use   Smoking status: Never    Passive exposure: Past   Smokeless tobacco: Never  Vaping Use   Vaping status: Never Used  Substance and Sexual Activity   Alcohol use: Never   Drug use: Never   Sexual activity: Not Currently  Other Topics Concern   Not on file  Social History Narrative   Lives in senior housing in [place] (apt[place]   Social Drivers of Health   Financial Resource Strain: Medium Risk (06/20/2023)   Received from [facility]   Overall Financial Resource Strain (CARDIA)    Difficulty of Paying Living Expenses: Somewhat hard  Food Insecurity: Food Insecurity Present (06/20/2023)   Received from [facility]   Hunger Vital Sign    Worried About Running Out of Food in the Last Year: Sometimes true    Ran Out of Food in the Last Year: Sometimes true  Transportation Needs: No Transportation Needs (06/20/2023)   Received from [facility]   PRAPARE - Transportation    Lack of Transportation (Medical): No    Lack of Transportation (Non-Medical): No  Physical Activity: Not on file  Stress: Not on file  Social Connections: Not on file     Family History: The patient's family history includes  Kidney disease in her mother; Tuberculosis in her father.  ROS:   Please see the history of present illness.     All other systems reviewed and are negative.  EKGs/Labs/Other Studies Reviewed:    The following studies were reviewed today:  EKG Interpretation Date/Time:  Wednesday September 20 2023 11:09:07 EDT Ventricular Rate:  119 PR Interval:  182 QRS Duration:  122 QT Interval:  382 QTC Calculation: 537 R Axis:   172  Text Interpretation: Atrial fibrillation Right bundle branch block Possible Lateral infarct , age undetermined Possible Inferior infarct , age undetermined Confirmed by Constancia Delton ([id number]) on 09/20/2023 11:34:17 AM    Recent Labs: 09/17/2023: ALT 13; B  Natriuretic Peptide 572.3; BUN 20; Creatinine, Ser 0.79; Hemoglobin 12.1; Magnesium 1.9; Platelets 125; Potassium 3.5; Sodium 138  Recent Lipid Panel    Component Value Date/Time   CHOL 186 05/23/2022 0554   TRIG 154 (H) 05/23/2022 0554   HDL 32 (L) 05/23/2022 0554   CHOLHDL 5.8 05/23/2022 0554   VLDL 31 05/23/2022 0554   LDLCALC 123 (H) 05/23/2022 0554     Risk Assessment/Calculations:             Physical Exam:    VS:  BP 136/86 (BP Location: Left Arm, Patient Position: Sitting, Cuff Size: Normal)   Pulse (!) 119   Ht 5' (1.524 m)   Wt 144 lb (65.3 kg)   SpO2 95%   BMI 28.12 kg/m     Wt Readings from Last 3 Encounters:  09/20/23 144 lb (65.3 kg)  09/17/23 144 lb (65.3 kg)  09/15/23 146 lb (66.2 kg)     GEN:  Well nourished, well developed in no acute distress HEENT: Normal NECK: No JVD; No carotid bruits CARDIAC: RRR, no murmurs, rubs, gallops RESPIRATORY:  Clear to auscultation without rales, wheezing or rhonchi  ABDOMEN: Soft, non-tender, non-distended MUSCULOSKELETAL:  No edema; No deformity  SKIN: Warm and dry NEUROLOGIC:  Alert and oriented x 3 PSYCHIATRIC:  Normal affect   ASSESSMENT:    1. Atrial fibrillation with RVR (HCC)   2. Primary hypertension    PLAN:    In  order of problems listed above:  Paroxysmal A-fib, EKG today showing A-fib with RVR.  Start Lopressor 25 mg twice daily, increase Amio to 200 mg daily.  Continue Eliquis 2.5 mg twice daily.  If no significant bleeding issues, plan to increase Eliquis to 5 mg twice daily.  Refer to EP for additional input,?  Watchman candidate due to frequent falls, anemia.  Recent echo with EF 55 to 60%. Hypertension, BP controlled.  Continue Lasix 40 mg daily, Lopressor as above.  Follow-up in 6 to 8 weeks.      Medication Adjustments/Labs and Tests Ordered: Current medicines are reviewed at length with the patient today.  Concerns regarding medicines are outlined above.  Orders Placed This Encounter  Procedures   T4, free   TSH   Ambulatory referral to Cardiac Electrophysiology   EKG 12-Lead   Meds ordered this encounter  Medications   metoprolol tartrate (LOPRESSOR) 25 MG tablet    Sig: Take 1 tablet (25 mg total) by mouth 2 (two) times daily.    Dispense:  180 tablet    Refill:  3   amiodarone (PACERONE) 200 MG tablet    Sig: Take 1 tablet (200 mg total) by mouth daily.    Dispense:  90 tablet    Refill:  3    Patient Instructions  Medication Instructions:  Start Lopressor 25 mg twice daily Increase Amiodarone 200 mg daily  *If you need a refill on your cardiac medications before your next appointment, please call your pharmacy*  Lab Work: TSH & Free T4 today  Testing/Procedures: NONE ordered at this time of appointment    Follow-Up: At [facility], you and your health needs are our priority.  As part of our continuing mission to provide you with exceptional heart care, our providers are all part of one team.  This team includes your primary Cardiologist (physician) and Advanced Practice Providers or APPs (Physician Assistants and Nurse Practitioners) who all work together to provide you with the care you need, when you need it.  Your next appointment:   6-8  week(s)  Provider:   Constancia Delton, MD    We recommend signing up for the patient portal called "MyChart".  Sign up information is provided on this After Visit Summary.  MyChart is used to connect with patients for Virtual Visits (Telemedicine).  Patients are able to view lab/test results, encounter notes, upcoming appointments, etc.  Non-urgent messages can be sent to your provider as well.   To learn more about what you can do with MyChart, go to [email or web address].            Signed, Constancia Delton, MD  09/20/2023 12:18 PM    [facility]

## 2023-09-20 NOTE — Patient Instructions (Signed)
 Medication Instructions:  Start Lopressor 25 mg twice daily Increase Amiodarone 200 mg daily  *If you need a refill on your cardiac medications before your next appointment, please call your pharmacy*  Lab Work: TSH & Free T4 today  Testing/Procedures: NONE ordered at this time of appointment    Follow-Up: At [facility], you and your health needs are our priority.  As part of our continuing mission to provide you with exceptional heart care, our providers are all part of one team.  This team includes your primary Cardiologist (physician) and Advanced Practice Providers or APPs (Physician Assistants and Nurse Practitioners) who all work together to provide you with the care you need, when you need it.  Your next appointment:   6-8 week(s)  Provider:   Constancia Delton, MD    We recommend signing up for the patient portal called "MyChart".  Sign up information is provided on this After Visit Summary.  MyChart is used to connect with patients for Virtual Visits (Telemedicine).  Patients are able to view lab/test results, encounter notes, upcoming appointments, etc.  Non-urgent messages can be sent to your provider as well.   To learn more about what you can do with MyChart, go to [email or web address].

## 2023-09-21 LAB — TSH: TSH: 0.459 u[IU]/mL (ref 0.450–4.500)

## 2023-09-21 LAB — T4, FREE: Free T4: 1.56 ng/dL (ref 0.82–1.77)

## 2023-09-25 ENCOUNTER — Encounter: Admit: 2023-09-25 | Discharge: 2023-09-25 | Payer: MEDICARE

## 2023-09-25 DIAGNOSIS — K5902 Outlet dysfunction constipation: Secondary | ICD-10-CM

## 2023-09-25 NOTE — Progress Notes (Signed)
 Electrophysiology Office Note:   Date:  09/26/2023  ID:  Erin Virginia  [name], [name] Jan 08, 1946, MRN [id number]  Primary Cardiologist: None Electrophysiologist: Ardeen Kohler, MD      History of Present Illness:   Erin Virginia  [name] is a 78 y.o. 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   Discussed the use of AI scribe software for clinical note transcription with the patient, who gave verbal consent to proceed.  History of Present Illness Admitted to the hospital 04/2023 after a mechanical fall. She slipped and fell sustaining sacral fracture and scalp hematoma. Was previously on Eliquis , but this was stopped after fall. Also has anemia being managed by oncology with iron  infusions. Eliquis  2.5 mg twice has since been resumed. Patient has not had any bleeding issues so far. She is a high fall risk and reports recurrent falls for years. 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 No new or acute complaints today.    Review of systems complete and found to be negative unless listed in HPI.   EP Information / Studies Reviewed:    EKG is not ordered today. EKG from 09/20/23 reviewed which showed AF.       Echo 04/2023:   1. Left ventricular ejection fraction, by estimation, is 55 to 60%. The  left ventricle has normal function. The left ventricle has no regional  wall motion abnormalities. Left ventricular diastolic parameters are  consistent with Grade I diastolic  dysfunction (impaired relaxation).   2. Right ventricular systolic function is normal. The right ventricular  size is normal. There is normal pulmonary artery systolic pressure.   3. Left atrial size was mildly dilated.   4. The mitral valve is normal in structure. Mild mitral valve  regurgitation. Mild to moderate mitral stenosis. The mean mitral valve  gradient is 6.0 mmHg.  Severe mitral annular calcification.   5. The aortic valve is normal in structure. Aortic valve regurgitation is  trivial. No aortic stenosis is present.   6. The inferior vena cava is normal in size with greater than 50%  respiratory variability, suggesting right atrial pressure of 3 mmHg.   Nuclear Stress 05/24/22:    The study is normal. The study is low risk.   No ST deviation was noted.   LV perfusion is normal. There is no evidence of ischemia. There is no evidence of infarction.   Left ventricular function is normal. Nuclear stress EF: 82 %. The left ventricular ejection fraction is hyperdynamic (>65%).   1.  Normal left ventricular function 2.  No evidence for scar or ischemia 3.  Low risk study  Risk Assessment/Calculations:    CHA2DS2-VASc Score = 5   This indicates a 7.2% annual risk of stroke. The patient's score is based upon: CHF History: 0 HTN History: 1 Diabetes History: 1 Stroke History: 0 Vascular Disease History: 0 Age Score: 2 Gender Score: 1             Physical Exam:   VS:  BP 102/62   Pulse 63   Ht 5' (1.524 m)   Wt 143 lb 9.6 oz (65.1 kg)   SpO2 92%   BMI 28.04 kg/m    Wt Readings from Last 3 Encounters:  09/26/23 143 lb 9.6 oz (65.1 kg)  09/20/23 144 lb (65.3 kg)  09/17/23 144 lb (65.3 kg)     GEN: Well nourished,  well developed in no acute distress NECK: No JVD CARDIAC: Normal rate, regular rhyth RESPIRATORY:  Clear to auscultation without rales, wheezing or rhonchi  ABDOMEN: Soft, non-distended EXTREMITIES:  No edema; No deformity   ASSESSMENT AND PLAN:   I have seen Erin Virginia  [name] in the office today who is being considered for a Watchman left atrial appendage closure device. I believe they will benefit from this procedure given their history of atrial fibrillation, CHA2DS2-VASc score of 5 and unadjusted ischemic stroke rate of 7.2% per year. Unfortunately, the patient is not felt to be a long term anticoagulation candidate  secondary to recurrent falls, bleeding, anemia. The patient's chart has been reviewed and I feel that they would be a candidate for short term oral anticoagulation after Watchman implant.   It is my belief that after undergoing a LAA closure procedure, Erin Virginia  [name] will not need long term anticoagulation which eliminates anticoagulation side effects and major bleeding risk.   Procedural risks for the Watchman implant have been reviewed with the patient including a 0.5% risk of stroke, <1% risk of perforation and <1% risk of device embolization. Other risks include bleeding, vascular damage, tamponade, worsening renal function, and death. The patient understands these risk and wishes to proceed.     The published clinical data on the safety and effectiveness of WATCHMAN include but are not limited to the following: - Holmes DR, Evalene Hilda, Sick P et al. for the PROTECT AF Investigators. Percutaneous closure of the left atrial appendage versus warfarin therapy for prevention of stroke in patients with atrial fibrillation: a randomised non-inferiority trial. Lancet 2009; 374: 534-42. Evalene Hilda, Doshi SK, Deloria Fetch D et al. on behalf of the PROTECT AF Investigators. Percutaneous Left Atrial Appendage Closure for Stroke Prophylaxis in Patients With Atrial Fibrillation 2.3-Year Follow-up of the PROTECT AF (Watchman Left Atrial Appendage System for Embolic Protection in Patients With Atrial Fibrillation) Trial. Circulation 2013; 127:720-729. - Alli O, Doshi S,  Kar S, Reddy VY, Sievert H et al. Quality of Life Assessment in the Randomized PROTECT AF (Percutaneous Closure of the Left Atrial Appendage Versus Warfarin Therapy for Prevention of Stroke in Patients With Atrial Fibrillation) Trial of Patients at Risk for Stroke With Nonvalvular Atrial Fibrillation. J Am Coll Cardiol 2013; 61:1790-8. Bartholome Ligas DR, Mario Sicard, Price M, Whisenant B, Sievert H, Doshi S, Huber K, Reddy V. Prospective randomized  evaluation of the Watchman left atrial appendage Device in patients with atrial fibrillation versus long-term warfarin therapy; the PREVAIL trial. Journal of the [employer] of Cardiology, Vol. 4, No. 1, 2014, 1-11. - Kar S, Doshi SK, Sadhu A, Horton R, Osorio J et al. Primary outcome evaluation of a next-generation left atrial appendage closure device: results from the PINNACLE FLX trial. Circulation 2021;143(18)1754-1762.    After today's visit with the patient which was dedicated solely for shared decision making visit regarding LAA closure device, the patient decided to proceed with the LAA appendage closure procedure scheduled to be done in the near future at [facility].  HAS-BLED score 3 Hypertension Yes  Abnormal renal and liver function (Dialysis, transplant, Cr >2.26 mg/dL /Cirrhosis or Bilirubin >2x Normal or AST/ALT/AP >3x Normal) No  Stroke No  Bleeding Yes  Labile INR (Unstable/high INR) No  Elderly (>65) Yes  Drugs or alcohol  (>= 8 drinks/week, anti-plt or NSAID) No   CHA2DS2-VASc Score = 5  The patient's score is based upon: CHF History: 0 HTN History: 1 Diabetes History: 1 Stroke History:  0 Vascular Disease History: 0 Age Score: 2 Gender Score: 1       ASSESSMENT AND PLAN: Paroxysmal Atrial Fibrillation (ICD10:  I48.0) The patient's CHA2DS2-VASc score is 5, indicating a 7.2% annual risk of stroke. Currently rate controlled in AF today.  -We will pursue TEE and DCCV which will also serve as pre-imaging to assess if LAA anatomy is feasible for closure. -Continue amiodarone  200mg  once daily.  -Continue metoprolol  XL 25mg  BID.   Secondary Hypercoagulable State (ICD10:  D68.69){ The patient is at significant risk for stroke/thromboembolism based upon her CHA2DS2-VASc Score of 5. We will increase Eliquis  to 5mg  twice daily in anticipation of TEE/DCCV and Watchman.   Follow up with Dr. Daneil Dunker  3 months after [facility].   Signed, Ardeen Kohler, MD

## 2023-09-25 NOTE — Progress Notes (Signed)
 Diagnosis:   1. Dyssynergic defecation                Referring Provider:Pegram, Bambi Lever, [employer]    Insurance plan:   Payer/Plan Subscr DOB Sex Relation Sub. Ins. ID Effective Group Num   1. MEDICARE - [name], [name] 1945/08/27 Female Self [id number] 04/06/14                                    [address]   2. GENERIC COMMETARRI, [name] Mar 26, 1946 Female Self [id number] 03/06/21                                    [employer] [place] [employer]                         # of visits per insurance authorization:08/09/2023/TDC IV/[id number]/BENEFIT AUTH INFO  PT,OT, ST -PLAN: MEDICARE A AND B  PT & ST GET [id number] COMBINED PCY USED- $0 POSSIBLE 28 VISITS REMAINING     *ONCE PATIENT REACHES THERAPY CAP IF IT'S MED NEC TO CONTINUE   THERAPIST MUST NOTIFY IV TEAM TO ADD KX MODIFIERS AND     PT/OT/SP-PLAN:  PT. HAS [employer] SUPP[employer]MENT INS PLAN SECONDARY  FOLLOWS MEDICARE GUIDELINES, ONLY PAYS IF MEDICARE PAYS    # of visits per POC: 10   Date of Initial Eval: 08/24/23    Verified Signed POC:    [x]  YES  []   NO   Date: Attestation signed by Johnanna Mylar, CNP at 08/24/2023  2:04 PM     Rerouted to Referral Source for signature via EMR []  Fax []   Mail []    Date:  Routed to PCP (if other than referring) for signature via   EMR []  Fax []    Mail []   Date:    Was POC updated?   []   YES []   NO    Date:        If yes, Verified Signed POC:    []   YES  []   NO   Date:        Precautions: G1P1- cesarean delivery, Carpal tunnel on L; TKA; wrist fx surgery , osteoporosis, TSA    Subjective: 09/25/23: Pt reports she is not feeling great- feels like she ate something that didn't agree with her. States she is still going through the cycle of feeling like she is backing up and then having a day with several bowel movements, even though she is having bowel movements more regularly. Feels like her belly feels very full after she eats.     09/11/23: Pt reports she is overall feeling ok. Feels like she is making progress. Still has periods of constipation-  where she feels like she needs to go, bloated, but can't get things out. This is better than it was before. Saturday night she had to get up a few times to try. Sunday morning she was able to have a more complete bowel movement. Feels like the massage is helping her manage her bloat.     09/04/23: Pt reports she feels like she is getting the hang of trying to contract and relax and pelvic floor. Pt is having bowel movements every day to every other day. States after bowel movements is when she generally feels bloated and full. Massage helped a little  with this. States still feels like she is having incomplete emptying. She has been noticing an odor from vagina this is concerning to her.    08/29/23: Pt reports she is overall feeling good. Feels like the awareness of her bowel habits has really helped her understand her symptoms. She is now using a stool under her feet for bowel movements. Has stopped straining and is working on breathing lengthening technique for emptying. She has been having daily bowel movements and sticking with a bowel routine. Also has been doing the bowel massage which really helps.    History of Current Problem: [name] is a 78 y.o. female who presents today with chief complaint of bowel dysfunction. This has been present for about 2 years since having L TKA June 2023. Was taking pain medication and feels this is what started it. Previously would have constipation and then 1-2 days of diarrhea. Started a regimen of metamucil/miralax  about 1 year ago. Didn't help much. Had a colonoscopy which was fine. Has incomplete emptying most of the time. States she stool is usually formed but not firm. Just doing metamucil every morning at this point. Husband passed away a couple years ago also recently retired- feels like this has changed her habits. Eating breakfast, lunch, dinner- very small amounts; usually not hungry. Pt has Osteoporosis. TSA August 25 2022- currently in PT for this because of a  fall she had (pushed over by dog) after surgery. Hx of TKA. Hx of B wrist ORIF.     Objective: Posture:   Absence of TA or spinal ext activation in seated or standing postures    Significant thoracic kyphosis, lumbar lordosis, sway back posture  -spine rotated to L  -hip shifted to R     Abdominal Palpation: TTP R lower quadrant     Diastasis Recti: Not tested     Breathing: chest dominant     Lumbar ROM                      Flexion 75%   Extension  15%   SB  L 15%; R 25%   Rotation                               LE MMT       Left        Right          Hip flex 4/5 4/5   Hip exten       Hip abd  3/5 4-/5   Hip ER 4-/5 4-/5   Hip IR 4-/5 4-/5                                            LE  ROM       Left        Right          Hip flex 95 95   Hip exten       Hip abd       Hip add       Hip ER  35  20   Hip IR  15  0      Core Strength:       Sahrmann Core Stability Test      Level 0 Unable to achieve level 1 position  Level 1 Begin in supine, hook-lying position while abdominal hollowing. Slowly raise 1 leg to 100 deg of hip flexion with comfortable knee flexion. Bring opposite leg to same position.   Level 2  From hip flexed position, slowly lower 1 leg until heel contacts surface. Slide heel out to fully extend the knee. Return to starting flexed position.   Level 3  From hip flexed position, slowly lower 1 leg until heel is 12 cm above surface. Slide heel out to fully extend the knee. Return to starting flexed position.   Level 4  From hip flexed position, slowly lower both legs until heel contacts surface.  Slide heel out to fully extend knees. Return to starting flexed position.   Level 5 From hip flexed position, slowly lower both legs until heels are 12 cm above surface. Slide heel out to fully extend knees. Return to starting flexed position.         External Perineal Observation  Voluntary contraction PFM PRESENT   Normal Involuntary contraction (with cough) ABSENT   Perineal descent with bearing down /  PFM mobility No muscle lengthening- increased intraabdominal pressure   Scarring / General skin condition None      Internal Pelvic Floor Palpation  PFM tone Normal   Muscle Volume Normal   Sensation Normal   Tender-trigger points, bands, spasms None         PFM Contraction Ability  MMT 1+ to 2-/5 all superficial muscle   Voluntary Relax Good   Endurance (sec) 5   # Quick contractions in 10 sec Unable to fully relax between contraction   Elevation ABSENT   Co-contraction ABSENT- was able to perform after training, min TrA activation       Rectal assessment: good squeeze and lift with contraction                                   Good lengthening of PFM with bearing down- increased activation of rectal   sphincter    Verbal consent for internal assessment obtained      Activity Date: 08/2023 08/29/23 09/04/23 09/11/23 09/25/23     Visit #  1  Initial  eval 2 3 4 5     Breathing + PFM + TrA 5-10 breaths, various positions throughout day Practiced with internal rectal exam    Breathing+ lengthening PFM for bowel movements;    Attic, 1st floor, basement Reviewed in supine and seated Breathing + TrA + PFM x 10 breaths - cues to reduce upper abdominal compensation    TrA marches; x10 Reviewed      TrA marches: cues for coordinaition    360 breathing   Various positions throughouthe day Reviewed     Cat cow or pelvic tilts   X 10 Reviewed Reviewed    LTR    X 10 each direction Reviewed Reviewed                   CPT code  Daily treatment record Treatment Time               PT Evaluation: 97001 Initial Evaluation    Self care:     Therapeutic Exercise: 97110     Therapeutic Activities: 97530 Pt education in PT eval findings, POC and information in anatomy & diagnosis provided.  - short 5-10 mins walk after meals for digestion  - reviewed bowel routine 9   Manual  therapy: 97140 ILU bowel massage- increased time spent cecum and sigmoid colon 16'   Neuromuscular Re-education: [id number] Activities/exercise completed to strengthen core,  improve pelvic muscle function and trunk stability and reduce pain in order to achieve goals:    See above for exercises    Internal assessment: not performed this visit    EXTERNAL RECTAL ASSESSMENT PERFORM: assesses ability to contract, relax, bear down; practiced exhale and lengthening- good downward movement    see above; rectal assessment performed today: practiced PFM lengthening with bearing down- tactile cues with tapping to relax PFM    -use of towel roll (seated) or mirror for tactile and visual cues for PFM lengthening   16    Pt response to treatment today:   Good    Next visit:  Reassess    Total Treatment TIme: 41   PFM - Pelvic Floor Muscles, STM - soft tissue mobilization,  MFR - myofascial release    Patient Education: 09/11/23: discussed metamucil vs miralax - pt taking metamucil daily;   -edu pt on probiotics  -managing bloat: massage, prone lying  -importance of daily exercises- return to gym- Medicare will pay for this  09/04/23: - managing bloat: ILU massage, prone laying, TrA activation  -encourage pt to speak with gyn on odor as this can be an issue with PH balance   -reviewed the importance of breathing   -reviewed pushing strategies for bowel movements  08/29/23: edu pt on external anal sphincter and how contracting when bearing down can contribute to incomplete emptying  -reviewed bowel habits: stool under feet, fiber 20-30 grams, bowel routine, bowel massage, emptying strategies  - concept of PFM attic, 1st floor, basement and when you are using the muscles in those different functions  08/24/23: -bowel routine: wake up, bowel massage, warm liquid, sit on toilet   -10 min max time on toilet  -no straining  -squatty potty  -importance of fiber: provided pt with fiber handout and recommendation 20-30 grams  -importance of hydration  -daily exercise  -ILU massage  -urgency and how brain bladder connection sometimes need retrained  -how posture impacts bowels      Assessment: Session slightly  reduce due to patient not feeling well this visit. Pt is making good progress in regards to managing her bowel concerns and having more consistent bowel movements. She feels like she is still dealing with some bloating and constipation but is manageable with exercise, bowel massage, and miralax /metamucil. She is feeling good managing her symptoms on her own at this point. Plan to reassess pt in 4-6 weeks and determine effectiveness and independent HEP and plan for D/C    Pt is a 78 year old G1P1 female presenting with signs and symptoms consistent with constipation, abdominal pain, UUI  Pt demonstrated impaired motor control of PFM ( 2-) and TA, poor timing of PFMC with increased intra-abdominal pressure, poor postural awareness, and poor knowledge of good bowel and bladder habits.   Pt is a good candidate for physical therapy to address myofascial restrictions, joint hypo-mobility and malalignment, poor postural awareness, decreased core stability and hip strength, and resulting biomechanical and functional impairments. Patient/family received education on the purpose of therapy, participated in the development of the POC and verbalized understanding and agreement of POC and goals.      Goals of Therapy:      STG Goals - to be achieved in 2 visits:  Initial Status 08/24/2023   Good knowledge of appropriate voiding intervals, toileting posture, and hygiene Poor Knowledge of appropriate voiding intervals, toileting posture, and hygiene  -discussed toilet position   Demonstrates good knowledge and implementation of self-relaxation, self-correction of posture, self-help / pain management techniques  Poor knowledge of self-relaxation, posture, pain management techniques      Independent in a progressive HEP to address functional limitations No current exercise program   LTG Goals - to be achieved in 10 visits:                           Initial Status 08/24/2023   Increased strength of PFM  to 2+/5  MMT     Endurance 7-10 sec x 10 reps, Quick flicks: 6-8 reps in a 10 sec interval for improved continence/sphincter control, pelvic organ support and pelvic ring stability trunk  PFM Contraction Ability  MMT 1+ to 2-/5 all superficial muscle   Voluntary Relax Good   Endurance (sec) 5   # Quick contractions in 10 sec Unable to fully relax between contraction   Elevation ABSENT   Co-contraction ABSENT- was able to perform after training, min TrA activation      Improved ROM of  lumbar spine/hips    Neutral lumbopelvic alignment Decreased ROM/flexibility of lumbar spine/hips  Lumbopelvic malalignment / postural impairment   Good core support to maintain correct postural align during ex and ADL's          Increase LE strength of glute/hips for improved pelvic ring stability, posture & functional mobility Poor core support / posture alignment     Inability to activate TrA     Decreased LE strengths          Able to utilize urge suppression & defer urination as appropriate.     Pt able to isolate [facility] with Knack maneuver to maintain continence with exertion (cough, lifting, walking, exercise)        Nocturia  2x/night     Urgency - unable to defer urination >5 min  Urinary incontinence with urgency,   Bowel movements every 1-2 days Bowel movements 2-3 x week   Outcome Test PFDI-20          PLAN:  1 x week for 10 weeks    [name], PT, PT, DPT    This note serves as a Discharge Summary should the patient not return to physical therapy per the above plan of care.

## 2023-09-26 ENCOUNTER — Encounter: Payer: Self-pay | Admitting: Cardiology

## 2023-09-26 ENCOUNTER — Ambulatory Visit: Payer: Medicare (Managed Care) | Attending: Cardiology | Admitting: Cardiology

## 2023-09-26 VITALS — BP 102/62 | HR 63 | Ht 60.0 in | Wt 143.6 lb

## 2023-09-26 DIAGNOSIS — R296 Repeated falls: Secondary | ICD-10-CM | POA: Diagnosis not present

## 2023-09-26 DIAGNOSIS — I480 Paroxysmal atrial fibrillation: Secondary | ICD-10-CM

## 2023-09-26 DIAGNOSIS — D6869 Other thrombophilia: Secondary | ICD-10-CM

## 2023-09-26 NOTE — Patient Instructions (Addendum)
 Medication Instructions:  Your physician recommends that you continue on your current medications as directed. Please refer to the Current Medication list given to you today.  *If you need a refill on your cardiac medications before your next appointment, please call your pharmacy*   Testing/Procedures: Watchman Your physician has requested that you have Left atrial appendage (LAA) closure device implantation is a procedure to put a small device in the LAA of the heart. The LAA is a small sac in the wall of the heart's left upper chamber. Blood clots can form in this area. The device, Watchman closes the LAA to help prevent a blood clot and stroke.   Follow-Up: At [facility], you and your health needs are our priority.  As part of our continuing mission to provide you with exceptional heart care, we have created designated Provider Care Teams.  These Care Teams include your primary Cardiologist (physician) and Advanced Practice Providers (APPs -  Physician Assistants and Nurse Practitioners) who all work together to provide you with the care you need, when you need it.  You will be contacted by Nurse Navigator, Karsten Fells to schedule your pre-procedure visit and procedure date. If you have any questions she can be reached at [phone].

## 2023-09-27 ENCOUNTER — Other Ambulatory Visit: Payer: Self-pay

## 2023-09-27 ENCOUNTER — Telehealth: Payer: Self-pay

## 2023-09-27 ENCOUNTER — Ambulatory Visit: Admit: 2023-09-27 | Payer: MEDICARE | Attending: Audiologist

## 2023-09-27 DIAGNOSIS — I480 Paroxysmal atrial fibrillation: Secondary | ICD-10-CM

## 2023-09-27 DIAGNOSIS — H903 Sensorineural hearing loss, bilateral: Secondary | ICD-10-CM

## 2023-09-27 MED ORDER — APIXABAN 5 MG PO TABS: 5.0000 mg | ORAL_TABLET | Freq: Two times a day (BID) | ORAL | 0 refills | Status: DC

## 2023-09-27 NOTE — Addendum Note (Signed)
 Addended by: Tasharra Nodine A on: 09/27/2023 04:23 PM   Modules accepted: Orders

## 2023-09-27 NOTE — Telephone Encounter (Signed)
 Spoke with [name], the patient's son, and informed him of plan. He agreed to increase Eliquis  to 5 mg BID (with fall precautions). Scheduled the patient for TEE/DCCV 5/2 with Dr. Chancy Comber. MyChart message sent with instructions.   Called and confirmed plan with the patient. Instructed her to INCREASE ELIQUIS  to 5 mg BID. She will go to [name] tomorrow for new pill pack. She understood her TEE/DCCV is scheduled next Friday, 5/2, and if anatomy is suitable for LAAO she will be scheduled 5/29. She was grateful for call and agreed with plan.

## 2023-09-27 NOTE — Telephone Encounter (Signed)
-----   Message from Ardeen Kohler sent at 09/26/2023 10:16 PM EDT ----- Regarding: Watchman and more Thought about this patient more after clinic visit today. I was tentatively planning for Watchman on 5/29. She has a contrast allergy so was going to avoid pre-procedure CT but she is in AF currently and only on Eliquis  2.5mg  so thought we could do TEE and DCCV. TEE would serve as our pre-procedure screening for anatomy. Let's increase Eliquis  to 5mg  BID and set her up TEE with DCCV in 1 week. Stay on Eliquis  5mg  BID until Watchman with fall precautions.  Josh

## 2023-09-27 NOTE — Telephone Encounter (Signed)
 Spoke with the patient at length.  Per Dr. Daneil Dunker, informed her of plan to increase Eliquis  to 5 mg BID, have DCCV with TEE in one week, and LAAO 5/29. Per patient request, will speak with her son to confirm plan.

## 2023-09-27 NOTE — Progress Notes (Signed)
 HAC: Phonak Audeo I 70-R  Pt overall doing great hearing speech. She is having issues with music (violin)  Reviewed HA expectations  Created music program -decreased MPO, HF gain and disabled freq transposition   -discussed music expectations  Pt forgot to bring her phone today's appointment  Reviewed program use  Pt to follow-up in 1 month

## 2023-10-05 ENCOUNTER — Ambulatory Visit: Payer: MEDICARE

## 2023-10-05 NOTE — Progress Notes (Signed)
 Pt called for pre procedure instructions.  Left message with info. Arrival time 0800 NPO after midnight explained Instructed to take am meds with sip of water and confirmed blood thinner consistency Instructed pt need for ride home tomorrow and have responsible adult with them for 24 hrs post procedure.

## 2023-10-05 NOTE — Progress Notes (Unsigned)
 [facility]   Return Office Visit  Nurse Practitioner    CHIEF COMPLAINT:  s/p Colonoscopy, Alternating C/D, Bloating    [name] is a 78 y.o. female who is seen in follow up.  [name] presents s/p colonoscopy.  We reviewed the results.  This should be her last screening colonoscopy due to age.  As far as her alternating constipation and diarrhea I encouraged her to continue taking the Metamucil daily.  I would also like her to increase her MiraLAX  to 1 full capful daily.  She can titrate the MiraLAX  accordingly once her bowel habits returned to normal.  I encouraged her to contact me if she has any questions or change in symptoms.  Patient was agreeable with this plan.    HISTORY OF PRESENT ILLNESS:    [name] is a 78 y.o. female who is seen in follow up.    Patient was last seen on 02/09/2023 for Alternating C/D, s/p colonoscopy, Bloating, Tubular Adenoma. [name] presents s/p Colonoscopy.  Colonoscopy showed 2 small tubular adenomas that were removed.  She states she continues to alternate constipation and diarrhea.  She has been taking 1 tablespoon of Metamucil daily and half a capful of MiraLAX  daily.  She has not taken any since her colonoscopy. She denies a recent change in bowel habits, hematochezia, melena, change in stool caliber. No unintentional weight loss, abdominal pain.     Denies any dysphagia, odynophagia, reflux, regurgitation, nausea. Denies any prior liver, pancreatic or gallbladder disease.      No history of anemia.  No sleep apnea. No narcotics. No history of anesthesia complications.    KUB: 11/04/2022  Moderate stool burden     Last Colonoscopy: 01/30/2023  Impression:    - Two 1 to 2 mm polyps in the transverse colon,                          removed with a cold biopsy forceps. Resected and                          retrieved.                          - The entire examined colon is normal. Biopsied.   Path:  A.   Colon, random, biopsy:   - Fragments of  colonic mucosa with small lymphoid aggregates, lamina   propria    with pigmented macrophages and mild increase chronic inflammatory cells.   - No overt evidence of microscopic colitis.     B.   Colon, transverse, polyps x2:   - Fragment of tubular adenoma x1.   - Additional fragment of benign colonic mucosa with small lymphoid   aggregate x1.   - No evidence of high-grade dysplasia.     Colonoscopy: 01/01/2002  Normal. Dr. Penelope Bowie at [facility]. Next recommended 5 years    Family History of Gastrointestinal Malignancy: none       REVIEW OF SYSTEMS:   (For the following ROS, pertinent positives are in bold; pertinent negatives are in italics.)  CONSTITUTIONAL: fevers, chills, sweats, weight loss, fatigue  CV: chest pain, dyspnea, palpitations  RESP: cough, dyspnea  GI: see HPI    Past Medical History:   Past Medical History:   Diagnosis Date    Acquired absence of ovary, unilateral     Missing left ovary.  Carpal tunnel syndrome on left 03/18/2016    Added automatically from request for surgery [id number]    Closed fracture of upper end of tibia 12/18/2009    Congenital absence of one kidney     No left kidney.    History of fall     fell 10/2015    Low back pain     Osteoarthritis     Osteoporosis     Urticaria     Varicella        Past Surgical History:   Past Surgical History:   Procedure Laterality Date    ARTHROSCOPY SHOULDER Right 02/17/2022    Procedure: Right Shoulder Arthroscopic Balloon Arthroplasty;  Surgeon: Catharine Clock, MD;  Location: [facility] OR [facility];  Service: Orthopedics;  Laterality: Right;    CESAREAN SECTION  1985    COLONOSCOPY N/A 01/30/2023    Procedure: COLONOSCOPY W/ BIOPSY;  Surgeon: Gladis Lame, MD;  Location: [facility];  Service: Gastroenterology;  Laterality: N/A;    HARDWARE REMOVAL  8/09    of left tibia plateau    LEG SURGERY  05/09/06    fractured left tibia plateau    RELEASE CARPAL TUNNEL Left 03/22/2016    Procedure: LEFT CARPAL TUNNEL RELEASE;  Surgeon: Lyndel Sanes, MD;  Location: [facility];   Service: Orthopedics;  Laterality: Left;    TOTAL KNEE ARTHROPLASTY Left 11/09/2021    Procedure: LEFT TOTAL KNEE ARTHROPLASTY;  Surgeon: Verdia Glad, MD;  Location: [facility] OR;  Service: Orthopedics;  Laterality: Left;    WRIST FRACTURE SURGERY Left 11/13/2015    Procedure: OPEN REDUCTION INTERNAL FIXATION LEFT DISTAL RADIUS FRACTURE;  Surgeon: Lyndel Sanes, MD;  Location: [facility] OR;  Service: Orthopedics;  Laterality: Left;       Current Medications:    Meds Reviews and Updated     ALLERGIES:  Iodine, Iodine and iodide containing products, Iodinated contrast media, and Shellfish containing products    SOCIAL HISTORY:    Social History     Socioeconomic History    Marital status: Widowed     Spouse name: Not on file    Number of children: Not on file    Years of education: Not on file    Highest education level: Not on file   Occupational History    Not on file   Tobacco Use    Smoking status: Never    Smokeless tobacco: Never   Vaping Use    Vaping status: Never Used   Substance and Sexual Activity    Alcohol use: Yes     Alcohol/week: 4.0 standard drinks of alcohol     Types: 4 Glasses of wine per week     Comment: ocasionally    Drug use: No     Comment: 12-23-2009    Sexual activity: Not Currently     Partners: Male   Other Topics Concern    Caffeine Use Yes    Occupational Exposure No    Exercise Yes    Seat Belt Yes   Social History Narrative    Not on file     Social Drivers of Health     Financial Resource Strain: Not on file   Food Insecurity: No Food Insecurity (11/01/2022)    Yearly Questionnaire     Do you need any assistance with obtaining housing, meals, medication, transportation or medical equipment?: No     Assistance needed for:: Not on file   Transportation Needs: No Transportation Needs (11/01/2022)    Yearly Questionnaire  Do you need any assistance with obtaining housing, meals, medication, transportation or medical equipment?: No     Assistance needed for:: Not on file   Physical Activity: Not on  file   Stress: Not on file   Social Connections: Not on file   Intimate Partner Violence: Not At Risk (01/30/2023)    Humiliation, Afraid, Rape, and Kick questionnaire     Fear of Current or Ex-Partner: No     Emotionally Abused: No     Physically Abused: No     Sexually Abused: No   Housing Stability: Low Risk  (11/01/2022)    Yearly Questionnaire     Do you need any assistance with obtaining housing, meals, medication, transportation or medical equipment?: No     Assistance needed for:: Not on file        FAMILY HISTORY:   Family History   Problem Relation Age of Onset    Stroke Mother     Melanoma Neg Hx        PHYSICAL EXAM:   Vital Signs:  There were no vitals taken for this visit.  There is no height or weight on file to calculate BMI.    CONSTITUTIONAL: WD/WN, in NAD  LUNGS: no increased work of breathing   CARDIOVASCULAR: RRR  ABDOMEN: no scars, normal bowel sounds, soft, non-distended, non-tender, no rebound or guarding, no hepatomegaly   MUSCULOSKELETAL: there is no redness, warmth, or swelling of the joints  SKIN: no redness, warmth, or swelling, no rashes and no jaundice    Lab Results   Component Value Date    WBC 5.7 11/01/2022    HGB 15.1 11/01/2022    HCT 43.1 11/01/2022    MCV 97.0 11/01/2022    PLT 217 11/01/2022     Lab Results   Component Value Date    GLUCOSE 96 11/01/2022    BUN 15 11/01/2022    CREATININE 0.74 11/01/2022    K 4.7 11/01/2022    PHOS 4.3 04/11/2022    ALBUMIN 4.5 11/01/2022    EGFR 83 11/01/2022     Lab Results   Component Value Date    ALKPHOS 83 11/01/2022    ALT 9 11/01/2022    AST 24 12/30/2009    AST 24 12/30/2009    BILITOT 0.4 11/01/2022    ALBUMIN 4.5 11/01/2022    PROT 6.9 11/01/2022    LABGLOB 2.5 12/30/2009    LABGLOB 2.5 12/30/2009     No results found for: IRON, TIBC, FERRITIN    DIAGNOSTIC STUDIES REVIEWED:    As Noted in the HPI     ASSESSMENT AND PLAN:  [name] is a 78 y.o. female who is seen in follow up.  Ms. Fleege presents s/p colonoscopy.  We  reviewed the results.  This should be her last screening colonoscopy due to age.  As far as her alternating constipation and diarrhea I encouraged her to continue taking the Metamucil daily.  I would also like her to increase her MiraLAX  to 1 full capful daily.  She can titrate the MiraLAX  accordingly once her bowel habits returned to normal.  I encouraged her to contact me if she has any questions or change in symptoms.  Patient was agreeable with this plan.    Return to Clinic: 6 months    This document was dictated using Conservation officer, historic buildings.  While every effort is made to proofread the document as it is being created, please excuse any transcription errors  encountered in reading.    Johnanna Mylar, APRN  [facility]   [phone]

## 2023-10-06 ENCOUNTER — Ambulatory Visit (HOSPITAL_COMMUNITY)
Admission: RE | Admit: 2023-10-06 | Discharge: 2023-10-06 | Disposition: A | Discharge status: Home / Self Care | Payer: Medicare (Managed Care) | Attending: Internal Medicine | Admitting: Internal Medicine

## 2023-10-06 ENCOUNTER — Encounter (HOSPITAL_COMMUNITY)
Admission: RE | Disposition: A | Discharge status: Home / Self Care | Payer: Self-pay | Source: Home / Self Care | Attending: Internal Medicine

## 2023-10-06 ENCOUNTER — Ambulatory Visit (HOSPITAL_COMMUNITY): Admission: RE | Admit: 2023-10-06 | Payer: Medicare (Managed Care) | Source: Ambulatory Visit

## 2023-10-06 ENCOUNTER — Encounter (HOSPITAL_COMMUNITY): Payer: Self-pay | Admitting: Anesthesiology

## 2023-10-06 ENCOUNTER — Other Ambulatory Visit: Payer: Self-pay

## 2023-10-06 DIAGNOSIS — I480 Paroxysmal atrial fibrillation: Secondary | ICD-10-CM | POA: Insufficient documentation

## 2023-10-06 DIAGNOSIS — Z539 Procedure and treatment not carried out, unspecified reason: Secondary | ICD-10-CM | POA: Diagnosis not present

## 2023-10-06 SURGERY — TRANSESOPHAGEAL ECHOCARDIOGRAM (TEE) (CATHLAB)
Anesthesia: Monitor Anesthesia Care

## 2023-10-06 MED ORDER — SODIUM CHLORIDE 0.9% FLUSH: 3.0000 mL | INTRAVENOUS | Status: DC | PRN

## 2023-10-06 MED ORDER — SODIUM CHLORIDE 0.9% FLUSH
3.0000 mL | Freq: Two times a day (BID) | INTRAVENOUS | Status: DC
Start: 2023-10-06 — End: 2023-10-06

## 2023-10-06 NOTE — Anesthesia Preprocedure Evaluation (Signed)
 Anesthesia Evaluation    Reviewed: Allergy & Precautions, Patient's Chart, lab work & pertinent test results  Airway        Dental   Pulmonary asthma , COPD,  oxygen dependent          Cardiovascular hypertension, + DVT  + dysrhythmias Atrial Fibrillation      Neuro/Psych  PSYCHIATRIC DISORDERS Anxiety     negative neurological ROS     GI/Hepatic hiatal hernia,,,(+) Cirrhosis         Endo/Other  negative endocrine ROSdiabetes    Renal/GU negative Renal ROS     Musculoskeletal negative musculoskeletal ROS (+)    Abdominal   Peds  Hematology  (+) Blood dyscrasia (Eliquis ) Lymphoma    Anesthesia Other Findings A-FIB  Reproductive/Obstetrics                             Anesthesia Physical Anesthesia Plan  ASA:   Anesthesia Plan: MAC   Post-op Pain Management:    Induction:   PONV Risk Score and Plan:   Airway Management Planned:   Additional Equipment:   Intra-op Plan:   Post-operative Plan:   Informed Consent:   Plan Discussed with:   Anesthesia Plan Comments:        Anesthesia Quick Evaluation

## 2023-10-09 ENCOUNTER — Telehealth: Payer: Self-pay

## 2023-10-09 DIAGNOSIS — R296 Repeated falls: Secondary | ICD-10-CM

## 2023-10-09 DIAGNOSIS — I480 Paroxysmal atrial fibrillation: Secondary | ICD-10-CM

## 2023-10-09 NOTE — Telephone Encounter (Signed)
 Left message to call back

## 2023-10-09 NOTE — Telephone Encounter (Signed)
-----   Message from Ardeen Kohler sent at 10/07/2023  3:10 PM EDT ----- Regarding: [facility] TEE was canceled. She agreed to a CT scan to assess LAA anatomy but will need pre-treatment due to contrast allergy. Will need to be done at [facility] per Dr. Chancy Comber. Can we get this ordered?  Thanks, [employer]

## 2023-10-10 MED ORDER — DIPHENHYDRAMINE HCL 50 MG PO TABS: ORAL_TABLET | ORAL | 0 refills | Status: DC

## 2023-10-10 MED ORDER — PREDNISONE 50 MG PO TABS: ORAL_TABLET | ORAL | 0 refills | Status: DC

## 2023-10-10 NOTE — Telephone Encounter (Signed)
 Scheduled the patient for cCT 10/24/2023. Contrast allergy meds called in and instructions reviewed. Instruction letter sent via MyChart for the patient and her son to review. Instructions sent to [facility] as well. The patient was grateful for call and agreed with plan.

## 2023-10-10 NOTE — Addendum Note (Signed)
 Addended by: Leontine Radman A on: 10/10/2023 01:30 PM   Modules accepted: Orders

## 2023-10-11 ENCOUNTER — Telehealth: Payer: Self-pay

## 2023-10-11 NOTE — Telephone Encounter (Signed)
 [name]

## 2023-10-19 ENCOUNTER — Ambulatory Visit: Admit: 2023-10-19 | Discharge: 2023-10-19 | Payer: MEDICARE

## 2023-10-19 DIAGNOSIS — K029 Dental caries, unspecified: Secondary | ICD-10-CM

## 2023-10-19 MED ORDER — ibuprofen (MOTRIN) 600 MG tablet
600 | ORAL_TABLET | Freq: Four times a day (QID) | ORAL | 0 refills | 10.00000 days | Status: AC | PRN
Start: 2023-10-19 — End: 2023-10-26

## 2023-10-19 MED ORDER — chlorhexidine (PERIDEX) 0.12 % solution
0.12 | Freq: Two times a day (BID) | 0 refills | 16.00000 days | Status: AC
Start: 2023-10-19 — End: 2023-11-02

## 2023-10-19 MED ORDER — acetaminophen (TYLENOL) 325 MG tablet
325 | ORAL_TABLET | Freq: Four times a day (QID) | ORAL | 0 refills | 11.00000 days | Status: AC | PRN
Start: 2023-10-19 — End: 2023-10-26

## 2023-10-19 MED ORDER — AMOXicillin (AMOXIL) 500 MG capsule
500 | ORAL_CAPSULE | Freq: Three times a day (TID) | ORAL | 0 refills | 7.00000 days | Status: AC
Start: 2023-10-19 — End: 2023-10-26

## 2023-10-19 NOTE — Procedures (Signed)
 [place]   Oral Maxillofacial Surgery      Pt. Name: [name]  Pt. MRN: [id number]  DOB: 12-May-1946            Sex: female  Provider: Christy Crandall, MD, DMD  Location of Care: [place] [facility] at [facility]    Procedure: Extraction #32  Date of Procedure: 10/19/2023     Reviewed H&P; no significant changes to medical history or medications.  History and heart/lung exam unchanged.    Informed Consent/Counseling Statement:  Plan, alternatives and risks of anesthesia, including death have been explained to and discussed with the patient/legal guardian.  By my assessment, the patient/legal guardian understands and agrees. Scenario presented in detail. Question answered.    Local anesthesia administered: 1 carpules of Lidocaine  2% with 1:100k epi                                                     1 carpule of marcaine  0.5% with 1:200k epi    Tooth# 32:  Anesthesia administered via block and local infiltration. After anesthesia was achieved, gauze throat pack placed and bite block inserted.    A #15 blade used to make a sulcular incision and a periosteal elevator was used to elevate a conservative full thickness mucoperiosteal flap.   The tooth was elevated and removed from its socket with dental elevators and forceps without complications. The socket was curettaged, suctioned and irrigated. A figure of 8 suture placed with 3-0 chromic.   Gauze pack placed, hemostasis achieved.  Complications/Abnormal findings: N/A  Estimated Blood Loss: Minimal  Patient tolerated anesthesia and procedure well.    Rx:Ibuprofen  600mg  x 30 tabs  Tylenol  325mg  x 40 tabs  Peridex 473mL  Amox 500mg  TID x 21 tabs   Postoperative instructions given both verbally and written. Extra gauze packs given to patient.  Disposition: Patient to follow up on an as needed basis    Signed by: Christy Crandall, DMD, MD  Date: 10/19/23   Time: 10:02 AM

## 2023-10-19 NOTE — Progress Notes (Signed)
 [place] [facility]    Subjective   Visit Type:  OMS LOCAL WSD EXT  Pt. Name: [name]  Pt. MRN: [id number]  DOB: [birth year]              Sex: female  Visit Date:  10/19/2023  Provider:  Christy Crandall, MD, DMD  Location of Care: [place] [facility] at [facility]    No chief complaint on file.      HPI  [name] is a/an 78 y.o. female with pmhx sig for osteoporosis (h/o Fosamax use, now receiving Prolia injections q2x/yr, received 1 injection so far), s/p right shoulder arthroplasty and left knee replacement, sensorineural hearing loss who is referred from general dentist for extraction of tooth #32 (retained root).  Pt reports intermittent pain from this site. Pt denies swelling, drainage, trismus, NVFC, dyspnea, dysphagia, dysphonia, shortness of breath, chest pain or any other symptoms.    Past Med/Surg/Family/Social History:  Allergies: Allergies[1]  Medical History:   Past Medical History:   Diagnosis Date    Acquired absence of ovary, unilateral     Missing left ovary.    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    History of fall     fell 10/2015    Low back pain     Osteoarthritis     Osteoporosis     Urticaria     Varicella      Medications:   Current Outpatient Medications   Medication Sig    acetaminophen  Take 2 tablets (1,000 mg total) by mouth every 8 hours.    alendronate Take 1 tablet (35 mg total) by mouth every 7 days.    calcium  carbonate Chew 1 tablet (1,250 mg total) by mouth daily.    fluticasone  propionate shake liquid and use 2 sprays in each nostril daily    lactobacillus rhamnosus (GG) Take 1 capsule by mouth daily.    ofloxacin  Place 5 drops into the right ear daily.    ofloxacin  Place 5 drops into the right ear daily.    predniSONE  Take 3 tablets ( 60 mg) once daily for seven  days. Then take 2 tablets ( 40 mg) once daily for three days. Then take 1 tablets ( 20 mg) once daily for two days. Then take 1/2 tablet ( 10 mg) once daily for two days.    psyllium Take 1 tbsp in 8 oz water  daily    senna-docusate Take 1 tablet by mouth every 12 hours as needed for Constipation.     Current Facility-Administered Medications   Medication Dose Frequency Provider Last Admin    BUPivacaine  HCl  2 mL Once Marissa Lindsey Baum, [place]      lidocaine   2 mL Once Marissa Lindsey Smiley, [place]      triamcinolone  acetonide  80 mg Once Marissa Lindsey Baum, [place]       Past Surgical History:   Procedure Laterality Date    ARTHROSCOPY SHOULDER Right 02/17/2022    Procedure: Right Shoulder Arthroscopic Balloon Arthroplasty;  Surgeon: Catharine Clock, MD;  Location: [facility] OR [facility];  Service: Orthopedics;  Laterality: Right;    CESAREAN SECTION  1985    COLONOSCOPY N/A 01/30/2023    Procedure: COLONOSCOPY W/ BIOPSY;  Surgeon: Gladis Lame,  MD;  Location: [facility];  Service: Gastroenterology;  Laterality: N/A;    HARDWARE REMOVAL  8/09    of left tibia plateau    LEG SURGERY  05/09/06    fractured left tibia plateau    RELEASE CARPAL TUNNEL Left 03/22/2016    Procedure: LEFT CARPAL TUNNEL RELEASE;  Surgeon: Lyndel Sanes, MD;  Location: [facility];  Service: Orthopedics;  Laterality: Left;    TOTAL KNEE ARTHROPLASTY Left 11/09/2021    Procedure: LEFT TOTAL KNEE ARTHROPLASTY;  Surgeon: Verdia Glad, MD;  Location: [facility] OR;  Service: Orthopedics;  Laterality: Left;    WRIST FRACTURE SURGERY Left 11/13/2015    Procedure: OPEN REDUCTION INTERNAL FIXATION LEFT DISTAL RADIUS FRACTURE;  Surgeon: Lyndel Sanes, MD;  Location: [facility];  Service: Orthopedics;  Laterality: Left;     Family History   Problem Relation Age of Onset    Stroke Mother     Melanoma Neg Hx      Social History     Occupational History    Not on file   Tobacco Use    Smoking status: Never    Smokeless tobacco: Never   Vaping Use    Vaping status: Never Used   Substance and  Sexual Activity    Alcohol use: Yes     Alcohol/week: 4.0 standard drinks of alcohol     Types: 4 Glasses of wine per week     Comment: ocasionally    Drug use: No     Comment: 12-23-2009    Sexual activity: Not Currently     Partners: Male       ROS:     There were no vitals filed for this visit.  There is no height or weight on file to calculate BMI.    Review of Systems: 10-point ROS completed and is negative except noted in HPI.         Objective     Maxillofacial:   Atraumatic  Normo-cephalic  No facial swelling  No cervical masses or LAD  No pain to digital palpation - bilaterally  No clicking/popping/crepitus of TMJ  Normal anterior and laterotrusive movements  No trismus  CN II-XII intact    Oral:   Normal salivary flow, mucosa moist and pink  No vestibular edema/swelling/erythema  No uvular deviation, FOM soft and non-tender  No signs of acute infection  No purulence or drainage or fistulae noted  No soft tissue pathology  Fractured crown #32, grossly carious       Radiographic Evaluation/Imaging  PA from general dentist: Fractured crown #32 with retained root.         Assessment & Plan   ASA Classification: 2    [name] is a/an 78 y.o. female with pmhx sig for osteoporosis (h/o Fosamax use), s/p right shoulder arthroplasty and left knee replacement, sensorineural hearing loss who required extraction of tooth #32. Discussed risk of MRONJ given h/o Fosamax use. Discussed what MRONJ is and the possible need for additional procedures should a bone infection develop. Leaving an infected tooth in place however increases her risk of MRONJ further and I do recommend tooth #32 be extracted.     Return for extraction of tooth 32 under LA.  Informed consent will be obtained on day of surgery.  Risks, benefits, complications and treatment options discussed with patient.  Pre-operative instructions: No special instructions    Christy Crandall, DMD, MD  10/19/2023 9:17 AM  [facility]  [place] [facility]  [address]  [address]  [place] [address]  Dept: [phone]                 [1]   Allergies  Allergen Reactions    Iodine Anaphylaxis, Hives, Shortness Of Breath, Other (See Comments) and Itching     IVP dye for a kidney function test greater than 20 years ago    Iodine And Iodide Containing Products Anaphylaxis     Respiratory Distress  IVP dye for a kidney function test greater than 20 years ago  Respiratory Distress    Iodinated Contrast Media      Shortness of breath, hives.    Shellfish Containing Products      Advised to avoid shellfish due to allergy to iodine.

## 2023-10-23 ENCOUNTER — Telehealth (HOSPITAL_COMMUNITY): Payer: Self-pay | Admitting: *Deleted

## 2023-10-23 NOTE — Telephone Encounter (Signed)
 Attempted to call patient regarding upcoming cardiac CT appointment. Left message on voicemail with name and callback number  Larey Brick RN Navigator Cardiac Imaging [facility] [phone] Office [phone] Cell

## 2023-10-24 ENCOUNTER — Ambulatory Visit (HOSPITAL_COMMUNITY)
Admission: RE | Admit: 2023-10-24 | Discharge: 2023-10-24 | Disposition: A | Discharge status: Home / Self Care | Payer: Medicare (Managed Care) | Source: Ambulatory Visit | Attending: Cardiology | Admitting: Cardiology

## 2023-10-24 ENCOUNTER — Encounter (HOSPITAL_COMMUNITY): Payer: Self-pay

## 2023-10-24 ENCOUNTER — Other Ambulatory Visit (HOSPITAL_COMMUNITY): Payer: Self-pay | Admitting: *Deleted

## 2023-10-24 DIAGNOSIS — R296 Repeated falls: Secondary | ICD-10-CM

## 2023-10-24 DIAGNOSIS — I480 Paroxysmal atrial fibrillation: Secondary | ICD-10-CM

## 2023-10-24 MED ORDER — IOHEXOL 350 MG/ML SOLN: 100.0000 mL | Freq: Once | INTRAVENOUS | Status: DC | PRN

## 2023-10-24 MED ORDER — PREDNISONE 50 MG PO TABS: ORAL_TABLET | ORAL | 0 refills | Status: DC

## 2023-10-24 MED ORDER — DIPHENHYDRAMINE HCL 50 MG PO TABS: ORAL_TABLET | ORAL | 0 refills | Status: DC

## 2023-10-25 ENCOUNTER — Ambulatory Visit: Admit: 2023-10-25 | Discharge: 2023-10-25 | Payer: MEDICARE | Attending: Audiologist

## 2023-10-25 DIAGNOSIS — H903 Sensorineural hearing loss, bilateral: Secondary | ICD-10-CM

## 2023-10-25 NOTE — Progress Notes (Signed)
 HAC: Phonak AUdeo I  Reviewed wax filters  Changed domes  Paired and demonstrated streaming   -pt aware how to disconnect if needed for streaming  Paired and reviewed app use   -clarity and comfort  Provided pt with [employer] customer service number if needed for bluetooth issues  Pt to follow-up in 6 months

## 2023-10-26 ENCOUNTER — Ambulatory Visit (HOSPITAL_COMMUNITY)
Admission: RE | Admit: 2023-10-26 | Discharge: 2023-10-26 | Disposition: A | Discharge status: Home / Self Care | Payer: Medicare (Managed Care) | Source: Ambulatory Visit | Attending: Cardiovascular Disease | Admitting: Cardiovascular Disease

## 2023-10-26 DIAGNOSIS — I480 Paroxysmal atrial fibrillation: Secondary | ICD-10-CM

## 2023-10-26 DIAGNOSIS — R296 Repeated falls: Secondary | ICD-10-CM | POA: Insufficient documentation

## 2023-10-26 MED ORDER — IOHEXOL 350 MG/ML SOLN
100.0000 mL | Freq: Once | INTRAVENOUS | Status: AC | PRN
  Administered 2023-10-26: 100 mL via INTRAVENOUS

## 2023-10-27 ENCOUNTER — Telehealth: Payer: Self-pay

## 2023-10-27 ENCOUNTER — Other Ambulatory Visit: Payer: Self-pay

## 2023-10-27 DIAGNOSIS — R296 Repeated falls: Secondary | ICD-10-CM

## 2023-10-27 DIAGNOSIS — I480 Paroxysmal atrial fibrillation: Secondary | ICD-10-CM

## 2023-10-27 MED ORDER — PREDNISONE 50 MG PO TABS: ORAL_TABLET | ORAL | 0 refills | Status: DC

## 2023-10-27 NOTE — Telephone Encounter (Signed)
 [name]: Fairly circular ostium. Max 21/ AVG 20/ Depth 14.2 Likely use a 24mm or 27mm device and double curve sheath. Inf/Mid TSP RAO 21 CAU 15

## 2023-10-31 ENCOUNTER — Telehealth: Payer: Self-pay

## 2023-10-31 ENCOUNTER — Ambulatory Visit: Payer: Medicare (Managed Care) | Attending: Cardiology | Admitting: Cardiology

## 2023-10-31 VITALS — BP 100/50 | HR 56 | Ht 61.0 in | Wt 145.5 lb

## 2023-10-31 DIAGNOSIS — I480 Paroxysmal atrial fibrillation: Secondary | ICD-10-CM

## 2023-10-31 DIAGNOSIS — I10 Essential (primary) hypertension: Secondary | ICD-10-CM

## 2023-10-31 NOTE — Patient Instructions (Signed)
 Medication Instructions:  Your Physician recommend you continue on your current medication as directed.    *If you need a refill on your cardiac medications before your next appointment, please call your pharmacy*  Lab Work: No labs ordered today  If you have labs (blood work) drawn today and your tests are completely normal, you will receive your results only by: MyChart Message (if you have MyChart) OR A paper copy in the mail If you have any lab test that is abnormal or we need to change your treatment, we will call you to review the results.  Testing/Procedures: No test ordered today   Follow-Up: At [facility], you and your health needs are our priority.  As part of our continuing mission to provide you with exceptional heart care, our providers are all part of one team.  This team includes your primary Cardiologist (physician) and Advanced Practice Providers or APPs (Physician Assistants and Nurse Practitioners) who all work together to provide you with the care you need, when you need it.  Your next appointment:   6 month(s)  Provider:   You may see Dr. Junnie Olives or one of the following Advanced Practice Providers on your designated Care Team:   Laneta Pintos, NP Gildardo Labrador, PA-C Varney Gentleman, PA-C Cadence Bolton, PA-C Ronald Cockayne, NP Morey Ar, NP    We recommend signing up for the patient portal called "MyChart".  Sign up information is provided on this After Visit Summary.  MyChart is used to connect with patients for Virtual Visits (Telemedicine).  Patients are able to view lab/test results, encounter notes, upcoming appointments, etc.  Non-urgent messages can be sent to your provider as well.   To learn more about what you can do with MyChart, go to [email or web address].

## 2023-10-31 NOTE — Addendum Note (Signed)
 Addended by: Nitisha Civello A on: 10/31/2023 05:20 PM   Modules accepted: Orders

## 2023-10-31 NOTE — Progress Notes (Signed)
 Cardiology Office Note:    Date:  10/31/2023   ID:  Erin Jung [place]  [name], [name] 09-Feb-1946, MRN [id number]  PCP:  [facility]   [facility] Providers Cardiologist:  None Electrophysiologist:  Ardeen Kohler, MD     Referring MD: [facility] Service*   Chief Complaint  Patient presents with   6-8 week follow up     "Doing well." Patient is scheduled for a Watchman on Thursday, Nov 02, 2023.     History of Present Illness:    Erin [place]  [name] is a 78 y.o. 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  Being seen for paroxysmal A-fib.  Started on metoprolol , amiodarone  increased to 200 mg daily previously.  Eliquis  increased to 5 mg twice daily by EP team.  Tolerating Eliquis  with no bleeding issues.  Watchman is planned for 2 days.  Denies palpitations.  Prior notes/testing Has a history of anemia, frequent falls. Echocardiogram 04/2023 EF 55 to 60%.   Past Medical History:  Diagnosis Date   A-fib (HCC)    Anemia    Anxiety    Arthritis    Asthma    Cirrhosis (HCC)    COPD (chronic obstructive pulmonary disease) (HCC)    Diabetes mellitus without complication (HCC)    type 2   DVT (deep venous thrombosis) (HCC) 1980   GERD (gastroesophageal reflux disease)    History of hiatal hernia    Hypertension    Hypothyroidism    Lymphoma (HCC)     Past Surgical History:  Procedure Laterality Date   CATARACT EXTRACTION Bilateral    CHEST TUBE INSERTION Right 05/29/2023   Procedure: INSERTION PLEURAL DRAINAGE CATHETER;  Surgeon: Hilarie Lovely, MD;  Location: [facility] OR;  Service: Thoracic;  Laterality: Right;   CHOLECYSTECTOMY     COLONOSCOPY     ESOPHAGOGASTRODUODENOSCOPY (EGD) WITH PROPOFOL  N/A 01/17/2022   Procedure: ESOPHAGOGASTRODUODENOSCOPY (EGD) WITH PROPOFOL ;  Surgeon: Quintin Buckle, DO;  Location: [facility] ENDOSCOPY;  Service: Gastroenterology;   Laterality: N/A;   HUMERUS SURGERY Left    fracture (7 surgeries total)   IR BONE MARROW BIOPSY & ASPIRATION  04/26/2023   LOOP RECORDER INSERTION  2016/11/24   pt states battery died, not transmitting, just there   MENISCUS REPAIR Right Nov 24, 2017   REMOVAL OF PLEURAL DRAINAGE CATHETER Right 07/05/2023   Procedure: REMOVAL OF PLEURAL DRAINAGE CATHETER;  Surgeon: Annitta Kindler, MD;  Location: [facility] ORS;  Service: Pulmonary;  Laterality: Right;   THYROIDECTOMY     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; reconstruction from hip bone   TONSILECTOMY, ADENOIDECTOMY, BILATERAL MYRINGOTOMY AND TUBES     TOTAL KNEE ARTHROPLASTY Right 03/14/2022   Procedure: TOTAL KNEE ARTHROPLASTY;  Surgeon: Venus Ginsberg, MD;  Location: [facility] ORS;  Service: Orthopedics;  Laterality: Right;   TOTAL SHOULDER REPLACEMENT Left    TUBAL LIGATION     VIDEO ASSISTED THORACOSCOPY Right 05/29/2023   Procedure: RIGHT VIDEO ASSISTED THORACOSCOPY FOR PLEURAL BIOPSY;  Surgeon: Hilarie Lovely, MD;  Location: [facility] OR;  Service: Thoracic;  Laterality: Right;   WRIST SURGERY Left    multiple fractures from falls (14 surgeries from wrist to shoulder)    Current Medications: Current Meds  Medication Sig   acetaminophen  (TYLENOL ) 650 MG CR tablet Take 1,300 mg by mouth 2 (two) times daily.   albuterol  (VENTOLIN  HFA) 108 (90 Base) MCG/ACT inhaler Inhale 2 puffs into the lungs every 6 (six)  hours as needed for wheezing.   amiodarone  (PACERONE ) 200 MG tablet Take 1 tablet (200 mg total) by mouth daily.   apixaban  (ELIQUIS ) 5 MG TABS tablet Take 1 tablet (5 mg total) by mouth 2 (two) times daily.   Ascorbic Acid  (VITAMIN C ) 1000 MG tablet Take 1,000 mg by mouth daily.   bisacodyl  (DULCOLAX) 5 MG EC tablet Take 5 mg by mouth daily as needed for moderate constipation. Take for three days in a row   busPIRone  (BUSPAR ) 5 MG tablet Take 5 mg by mouth 3 (three) times daily as needed (Anxiety).   Carboxymeth-Glyc-Polysorb PF (REFRESH OPTIVE  ADVANCED PF) 0.5-1-0.5 % SOLN Place 1 drop into both eyes 4 (four) times daily as needed (dry eye).   Dextran 70-Hypromellose, PF, (GENTEAL TEARS MODERATE PF) 0.1-0.3 % SOLN Place 2 drops into both eyes 4 (four) times daily.   Docosahexaenoic Acid (DHA OMEGA 3 PO) Take 1,000 mg by mouth daily. Fish oil epa   Ensure (ENSURE) Take 237 mLs by mouth 3 (three) times daily between meals. Enlive 0.08 gram-1.5 kcal/ml Lactose-reduced   ferrous sulfate  325 (65 FE) MG tablet Take 325 mg by mouth every Monday, Wednesday, and Friday.   fluorometholone (FML) 0.1 % ophthalmic suspension Place 1 drop into both eyes 4 (four) times daily.   fluticasone  (FLONASE ) 50 MCG/ACT nasal spray Place 2 sprays into both nostrils daily.   fluticasone -salmeterol (ADVAIR HFA) 115-21 MCG/ACT inhaler Inhale 2 puffs into the lungs 2 (two) times daily.   furosemide  (LASIX ) 40 MG tablet Take 40 mg by mouth daily.   ipratropium-albuterol  (DUONEB) 0.5-2.5 (3) MG/3ML SOLN Take 3 mLs by nebulization every 6 (six) hours as needed (Cough , Shortness of breath or wheezing).   levothyroxine  (SYNTHROID ) 125 MCG tablet Take 125 mcg by mouth daily before breakfast. On empty stomach 30 Minutes before eating with 8 oz of water   lidocaine  (LIDODERM ) 5 % Place 1 patch onto the skin every 12 (twelve) hours. Right Knee   Lidocaine  HCl (ASPERCREME LIDOCAINE ) 4 % LIQD Apply 1 tablet topically in the morning, at noon, in the evening, and at bedtime. Roll-On to bilateral knees as needed   Magnesium  Oxide 250 MG TABS Take 250 mg by mouth daily.   metoprolol  tartrate (LOPRESSOR ) 25 MG tablet Take 1 tablet (25 mg total) by mouth 2 (two) times daily.   nitroGLYCERIN  (NITROSTAT ) 0.4 MG SL tablet Place 0.4 mg under the tongue every 5 (five) minutes as needed for chest pain.   olopatadine  (PATANOL) 0.1 % ophthalmic solution Place 1 drop into both eyes daily as needed (itchy watery eyes).   OXYGEN Inhale 2 L into the lungs daily as needed (During  COPD  exacerbation- Use as needed for shortness of breath , plus ox <89%., and/or breathing faster than 20/ minute).   polyethylene glycol (MIRALAX  / GLYCOLAX ) 17 g packet Take 17 g by mouth daily as needed for mild constipation or moderate constipation. Mixed with 8 oz of juice or water   potassium chloride  (KLOR-CON ) 10 MEQ tablet Take 10 mEq by mouth daily.   potassium chloride  (KLOR-CON ) 8 MEQ tablet Take 8 mEq by mouth at bedtime as needed (for leg cramps).   rOPINIRole  (REQUIP ) 0.25 MG tablet Take 0.25 mg by mouth at bedtime.   traMADol  (ULTRAM ) 50 MG tablet Take 50 mg by mouth every 4 (four) hours as needed for moderate pain (pain score 4-6) (Mild pain).   traZODone  (DESYREL ) 150 MG tablet Take 150 mg by mouth at bedtime.  Turmeric 500 MG TABS Take 500 mg by mouth daily.   Vitamin D -Vitamin K  (VITAMIN K2-VITAMIN D3 PO) Take 1 capsule by mouth daily. 10000 units/ 45 mcg   Vitamins A & D (VITAMIN A & D) ointment Apply 1 Application topically 3 (three) times daily as needed for dry skin.     Allergies:   Celebrex  [celecoxib ], Contrast media [iodinated contrast media], Oxycodone, Cephalosporins, Codeine, and Iodine   Social History   Socioeconomic History   Marital status: Widowed    Spouse name: Not on file   Number of children: 6   Years of education: Not on file   Highest education level: Not on file  Occupational History   Not on file  Tobacco Use   Smoking status: Never    Passive exposure: Past   Smokeless tobacco: Never  Vaping Use   Vaping status: Never Used  Substance and Sexual Activity   Alcohol  use: Never   Drug use: Never   Sexual activity: Not Currently  Other Topics Concern   Not on file  Social History Narrative   Lives in senior housing in [place] (apt[place]   Social Drivers of Health   Financial Resource Strain: Medium Risk (06/20/2023)   [facility]   Overall Financial Resource Strain (CARDIA)    Difficulty of Paying Living Expenses:  Somewhat hard  Food Insecurity: Food Insecurity Present (06/20/2023)   Received from [facility]   Hunger Vital Sign    Worried About Running Out of Food in the Last Year: Sometimes true    Ran Out of Food in the Last Year: Sometimes true  Transportation Needs: No Transportation Needs (06/20/2023)   Received from [facility]   PRAPARE - Transportation    Lack of Transportation (Medical): No    Lack of Transportation (Non-Medical): No  Physical Activity: Not on file  Stress: Not on file  Social Connections: Not on file     Family History: The patient's family history includes Kidney disease in her mother; Tuberculosis in her father.  ROS:   Please see the history of present illness.     All other systems reviewed and are negative.  EKGs/Labs/Other Studies Reviewed:    The following studies were reviewed today:  EKG Interpretation Date/Time:  Tuesday Oct 31 2023 11:47:45 EDT Ventricular Rate:  56 PR Interval:  206 QRS Duration:  80 QT Interval:  502 QTC Calculation: 484 R Axis:   -36  Text Interpretation: Sinus bradycardia Left axis deviation Confirmed by Constancia Delton ([id number]) on 10/31/2023 12:04:48 PM    Recent Labs: 09/17/2023: ALT 13; B Natriuretic Peptide 572.3; BUN 20; Creatinine, Ser 0.79; Hemoglobin 12.1; Magnesium  1.9; Platelets 125; Potassium 3.5; Sodium 138 09/20/2023: TSH 0.459  Recent Lipid Panel    Component Value Date/Time   CHOL 186 05/23/2022 0554   TRIG 154 (H) 05/23/2022 0554   HDL 32 (L) 05/23/2022 0554   CHOLHDL 5.8 05/23/2022 0554   VLDL 31 05/23/2022 0554   LDLCALC 123 (H) 05/23/2022 0554     Risk Assessment/Calculations:             Physical Exam:    VS:  BP (!) 100/50 (BP Location: Left Arm, Patient Position: Sitting, Cuff Size: Normal)   Pulse (!) 56   Ht 5\' 1"  (1.549 m)   Wt 145 lb 8 oz (66 kg)   SpO2 95%   BMI 27.49 kg/m     Wt Readings from Last 3 Encounters:  10/31/23 145 lb  8 oz (66 kg)  09/26/23 143 lb 9.6 oz  (65.1 kg)  09/20/23 144 lb (65.3 kg)     GEN:  Well nourished, well developed in no acute distress HEENT: Normal NECK: No JVD; No carotid bruits CARDIAC: RRR, no murmurs, rubs, gallops RESPIRATORY:  Clear to auscultation without rales, wheezing or rhonchi  ABDOMEN: Soft, non-tender, non-distended MUSCULOSKELETAL:  No edema; No deformity  SKIN: Warm and dry NEUROLOGIC:  Alert and oriented x 3 PSYCHIATRIC:  Normal affect   ASSESSMENT:    1. Paroxysmal atrial fibrillation (HCC)   2. Primary hypertension    PLAN:    In order of problems listed above:  Paroxysmal A-fib, EKG today showing sinus rhythm/sinus bradycardia.  Continue Lopressor  25 mg twice daily, amiodarone  200 mg daily, Eliquis  5 mg twice daily.  Appreciate input from EP, Watchman procedure planned in 2 days.  Recent echo with EF 55 to 60%.  Has scheduled follow-up with EP after watchman. Hypertension, BP controlled.  Continue Lopressor  25 mg twice daily.  Follow-up in 5 to 6 months.      Medication Adjustments/Labs and Tests Ordered: Current medicines are reviewed at length with the patient today.  Concerns regarding medicines are outlined above.  Orders Placed This Encounter  Procedures   EKG 12-Lead   No orders of the defined types were placed in this encounter.   Patient Instructions  Medication Instructions:  Your Physician recommend you continue on your current medication as directed.    *If you need a refill on your cardiac medications before your next appointment, please call your pharmacy*  Lab Work: No labs ordered today  If you have labs (blood work) drawn today and your tests are completely normal, you will receive your results only by: MyChart Message (if you have MyChart) OR A paper copy in the mail If you have any lab test that is abnormal or we need to change your treatment, we will call you to review the results.  Testing/Procedures: No test ordered today   Follow-Up: At [facility], you and your health needs are our priority.  As part of our continuing mission to provide you with exceptional heart care, our providers are all part of one team.  This team includes your primary Cardiologist (physician) and Advanced Practice Providers or APPs (Physician Assistants and Nurse Practitioners) who all work together to provide you with the care you need, when you need it.  Your next appointment:   6 month(s)  Provider:   You may see Dr. Junnie Olives or one of the following Advanced Practice Providers on your designated Care Team:   Laneta Pintos, NP Gildardo Labrador, PA-C Varney Gentleman, PA-C Cadence Allendale, PA-C Ronald Cockayne, NP Morey Ar, NP    We recommend signing up for the patient portal called "MyChart".  Sign up information is provided on this After Visit Summary.  MyChart is used to connect with patients for Virtual Visits (Telemedicine).  Patients are able to view lab/test results, encounter notes, upcoming appointments, etc.  Non-urgent messages can be sent to your provider as well.   To learn more about what you can do with MyChart, go to [email or web address].         Signed, Constancia Delton, MD  10/31/2023 3:04 PM    [facility]

## 2023-10-31 NOTE — Telephone Encounter (Signed)
 Confirmed procedure date of 11/02/2023. Confirmed arrival time of 0900 for procedure time at 1130. Reviewed pre-procedure instructions with patient. Confirmed she has her prednisone  and she knows to take it tomorrow night at 2230 and 5/29 at 0430 in preparation for receiving contrast. Confirmed she has no PPM or defibrillator. She has a loop recorder but it is dead. The patient was supposed to get labs drawn at her visit today but they were not completed. Labs will be drawn DOS. The patient understands to call if questions/concerns arise prior to procedure. She was grateful for call and agreed with plan.

## 2023-11-02 ENCOUNTER — Other Ambulatory Visit: Payer: Self-pay

## 2023-11-02 ENCOUNTER — Inpatient Hospital Stay (HOSPITAL_COMMUNITY): Payer: Medicare (Managed Care)

## 2023-11-02 ENCOUNTER — Inpatient Hospital Stay (HOSPITAL_COMMUNITY): Payer: Medicare (Managed Care) | Admitting: Anesthesiology

## 2023-11-02 ENCOUNTER — Encounter (HOSPITAL_COMMUNITY): Payer: Self-pay | Admitting: Cardiology

## 2023-11-02 ENCOUNTER — Inpatient Hospital Stay (HOSPITAL_COMMUNITY)
Admission: RE | Admit: 2023-11-02 | Discharge: 2023-11-03 | DRG: 274 | Disposition: A | Discharge status: Home / Self Care | Payer: Medicare (Managed Care) | Attending: Cardiology | Admitting: Cardiology

## 2023-11-02 ENCOUNTER — Inpatient Hospital Stay (HOSPITAL_COMMUNITY)
Admission: RE | Disposition: A | Discharge status: Home / Self Care | Payer: Self-pay | Source: Home / Self Care | Attending: Cardiology

## 2023-11-02 ENCOUNTER — Ambulatory Visit: Admit: 2023-11-02 | Discharge: 2023-11-02 | Payer: MEDICARE

## 2023-11-02 DIAGNOSIS — Z006 Encounter for examination for normal comparison and control in clinical research program: Secondary | ICD-10-CM

## 2023-11-02 DIAGNOSIS — Z91041 Radiographic dye allergy status: Secondary | ICD-10-CM | POA: Diagnosis not present

## 2023-11-02 DIAGNOSIS — I050 Rheumatic mitral stenosis: Secondary | ICD-10-CM | POA: Diagnosis present

## 2023-11-02 DIAGNOSIS — D6869 Other thrombophilia: Secondary | ICD-10-CM | POA: Diagnosis present

## 2023-11-02 DIAGNOSIS — Z7722 Contact with and (suspected) exposure to environmental tobacco smoke (acute) (chronic): Secondary | ICD-10-CM | POA: Diagnosis present

## 2023-11-02 DIAGNOSIS — I480 Paroxysmal atrial fibrillation: Secondary | ICD-10-CM

## 2023-11-02 DIAGNOSIS — J449 Chronic obstructive pulmonary disease, unspecified: Secondary | ICD-10-CM | POA: Diagnosis not present

## 2023-11-02 DIAGNOSIS — I4891 Unspecified atrial fibrillation: Principal | ICD-10-CM | POA: Diagnosis present

## 2023-11-02 DIAGNOSIS — Z95818 Presence of other cardiac implants and grafts: Secondary | ICD-10-CM

## 2023-11-02 DIAGNOSIS — C8510 Unspecified B-cell lymphoma, unspecified site: Secondary | ICD-10-CM | POA: Diagnosis present

## 2023-11-02 DIAGNOSIS — D509 Iron deficiency anemia, unspecified: Secondary | ICD-10-CM | POA: Diagnosis present

## 2023-11-02 DIAGNOSIS — E119 Type 2 diabetes mellitus without complications: Secondary | ICD-10-CM | POA: Diagnosis present

## 2023-11-02 DIAGNOSIS — Z7901 Long term (current) use of anticoagulants: Secondary | ICD-10-CM | POA: Diagnosis not present

## 2023-11-02 DIAGNOSIS — E039 Hypothyroidism, unspecified: Secondary | ICD-10-CM

## 2023-11-02 DIAGNOSIS — I10 Essential (primary) hypertension: Secondary | ICD-10-CM

## 2023-11-02 DIAGNOSIS — R296 Repeated falls: Secondary | ICD-10-CM | POA: Diagnosis present

## 2023-11-02 DIAGNOSIS — Z9889 Other specified postprocedural states: Secondary | ICD-10-CM

## 2023-11-02 HISTORY — PX: TRANSESOPHAGEAL ECHOCARDIOGRAM (CATH LAB): EP1270

## 2023-11-02 HISTORY — PX: LEFT ATRIAL APPENDAGE OCCLUSION: EP1229

## 2023-11-02 LAB — CBC WITH DIFFERENTIAL/PLATELET
Abs Immature Granulocytes: 0.05 10*3/uL (ref 0.00–0.07)
Basophils Absolute: 0 10*3/uL (ref 0.0–0.1)
Basophils Relative: 0 %
Eosinophils Absolute: 0 10*3/uL (ref 0.0–0.5)
Eosinophils Relative: 0 %
HCT: 38.2 % (ref 36.0–46.0)
Hemoglobin: 12.1 g/dL (ref 12.0–15.0)
Immature Granulocytes: 1 %
Lymphocytes Relative: 15 %
Lymphs Abs: 1.2 10*3/uL (ref 0.7–4.0)
MCH: 27.2 pg (ref 26.0–34.0)
MCHC: 31.7 g/dL (ref 30.0–36.0)
MCV: 85.8 fL (ref 80.0–100.0)
Monocytes Absolute: 0.3 10*3/uL (ref 0.1–1.0)
Monocytes Relative: 3 %
Neutro Abs: 7 10*3/uL (ref 1.7–7.7)
Neutrophils Relative %: 81 %
Platelets: 122 10*3/uL — ABNORMAL LOW (ref 150–400)
RBC: 4.45 MIL/uL (ref 3.87–5.11)
RDW: 19.3 % — ABNORMAL HIGH (ref 11.5–15.5)
WBC: 8.6 10*3/uL (ref 4.0–10.5)
nRBC: 0 % (ref 0.0–0.2)

## 2023-11-02 LAB — SURGICAL PCR SCREEN
MRSA, PCR: NEGATIVE
Staphylococcus aureus: NEGATIVE

## 2023-11-02 LAB — BASIC METABOLIC PANEL WITH GFR
Anion gap: 10 (ref 5–15)
BUN: 22 mg/dL (ref 8–23)
CO2: 23 mmol/L (ref 22–32)
Calcium: 8.9 mg/dL (ref 8.9–10.3)
Chloride: 102 mmol/L (ref 98–111)
Creatinine, Ser: 0.78 mg/dL (ref 0.44–1.00)
GFR, Estimated: >60 mL/min (ref >60)
Glucose, Bld: 179 mg/dL — ABNORMAL HIGH (ref 70–99)
Potassium: 4.2 mmol/L (ref 3.5–5.1)
Sodium: 135 mmol/L (ref 135–145)

## 2023-11-02 LAB — ECHO TEE: MV VTI: 0.95 cm2

## 2023-11-02 LAB — TYPE AND SCREEN
ABO/RH(D): A POS
Antibody Screen: NEGATIVE

## 2023-11-02 LAB — GLUCOSE, CAPILLARY
Glucose-Capillary: 154 mg/dL — ABNORMAL HIGH (ref 70–99)
Glucose-Capillary: 186 mg/dL — ABNORMAL HIGH (ref 70–99)
Glucose-Capillary: 212 mg/dL — ABNORMAL HIGH (ref 70–99)

## 2023-11-02 LAB — POCT ACTIVATED CLOTTING TIME: Activated Clotting Time: 291 s

## 2023-11-02 SURGERY — LEFT ATRIAL APPENDAGE OCCLUSION
Anesthesia: General

## 2023-11-02 MED ORDER — SODIUM CHLORIDE 0.9 % IV SOLN: INTRAVENOUS | Status: DC

## 2023-11-02 MED ORDER — IODIXANOL 320 MG/ML IV SOLN
INTRAVENOUS | Status: DC | PRN
Start: 2023-11-02 — End: 2023-11-02
  Administered 2023-11-02: 30 mL

## 2023-11-02 MED ORDER — NOREPINEPHRINE BITARTRATE 1 MG/ML IV SOLN
INTRAVENOUS | Status: DC | PRN
  Administered 2023-11-02 (×2): .5 mL via INTRAVENOUS

## 2023-11-02 MED ORDER — ALBUTEROL SULFATE (2.5 MG/3ML) 0.083% IN NEBU: 2.5000 mg | INHALATION_SOLUTION | Freq: Four times a day (QID) | RESPIRATORY_TRACT | Status: DC | PRN

## 2023-11-02 MED ORDER — VANCOMYCIN HCL IN DEXTROSE 1-5 GM/200ML-% IV SOLN
INTRAVENOUS | Status: AC
  Administered 2023-11-02: 1000 mg via INTRAVENOUS
  Filled 2023-11-02: qty 200

## 2023-11-02 MED ORDER — POTASSIUM CHLORIDE CRYS ER 10 MEQ PO TBCR
10.0000 meq | EXTENDED_RELEASE_TABLET | Freq: Every day | ORAL | Status: DC
  Administered 2023-11-02 – 2023-11-03 (×2): 10 meq via ORAL
  Filled 2023-11-02 (×3): qty 1

## 2023-11-02 MED ORDER — ONDANSETRON HCL 4 MG/2ML IJ SOLN
INTRAMUSCULAR | Status: DC | PRN
Start: 2023-11-02 — End: 2023-11-02
  Administered 2023-11-02: 4 mg via INTRAVENOUS

## 2023-11-02 MED ORDER — BUSPIRONE HCL 5 MG PO TABS: 5.0000 mg | ORAL_TABLET | Freq: Three times a day (TID) | ORAL | Status: DC | PRN

## 2023-11-02 MED ORDER — FERROUS SULFATE 325 (65 FE) MG PO TABS
325.0000 mg | ORAL_TABLET | ORAL | Status: DC
  Administered 2023-11-03: 325 mg via ORAL
  Filled 2023-11-02: qty 1

## 2023-11-02 MED ORDER — TRAMADOL HCL 50 MG PO TABS: 50.0000 mg | ORAL_TABLET | ORAL | Status: DC | PRN

## 2023-11-02 MED ORDER — IPRATROPIUM-ALBUTEROL 0.5-2.5 (3) MG/3ML IN SOLN: 3.0000 mL | Freq: Four times a day (QID) | RESPIRATORY_TRACT | Status: DC | PRN

## 2023-11-02 MED ORDER — TURMERIC 500 MG PO TABS: 500.0000 mg | ORAL_TABLET | Freq: Every day | ORAL | Status: DC

## 2023-11-02 MED ORDER — POLYVINYL ALCOHOL 1.4 % OP SOLN
2.0000 [drp] | Freq: Four times a day (QID) | OPHTHALMIC | Status: DC
  Administered 2023-11-02: 2 [drp] via OPHTHALMIC
  Filled 2023-11-02: qty 15

## 2023-11-02 MED ORDER — VANCOMYCIN HCL IN DEXTROSE 1-5 GM/200ML-% IV SOLN: 1000.0000 mg | INTRAVENOUS | Status: AC

## 2023-11-02 MED ORDER — SODIUM CHLORIDE 0.9 % IV SOLN: 250.0000 mL | INTRAVENOUS | Status: DC | PRN

## 2023-11-02 MED ORDER — CHLORHEXIDINE GLUCONATE 0.12 % MT SOLN
OROMUCOSAL | Status: AC
  Administered 2023-11-02: 15 mL
  Filled 2023-11-02: qty 15

## 2023-11-02 MED ORDER — ONDANSETRON HCL 4 MG/2ML IJ SOLN: 4.0000 mg | Freq: Four times a day (QID) | INTRAMUSCULAR | Status: DC | PRN

## 2023-11-02 MED ORDER — FUROSEMIDE 40 MG PO TABS
40.0000 mg | ORAL_TABLET | Freq: Every day | ORAL | Status: DC
  Administered 2023-11-02 – 2023-11-03 (×2): 40 mg via ORAL
  Filled 2023-11-02 (×2): qty 1

## 2023-11-02 MED ORDER — APIXABAN 5 MG PO TABS
5.0000 mg | ORAL_TABLET | Freq: Once | ORAL | Status: AC
  Administered 2023-11-02: 5 mg via ORAL
  Filled 2023-11-02: qty 1

## 2023-11-02 MED ORDER — APIXABAN 5 MG PO TABS
5.0000 mg | ORAL_TABLET | Freq: Two times a day (BID) | ORAL | Status: DC
  Administered 2023-11-03: 5 mg via ORAL
  Filled 2023-11-02: qty 1

## 2023-11-02 MED ORDER — PREDNISONE 20 MG PO TABS
50.0000 mg | ORAL_TABLET | ORAL | Status: AC
  Administered 2023-11-02: 50 mg via ORAL
  Filled 2023-11-02: qty 3

## 2023-11-02 MED ORDER — INSULIN ASPART 100 UNIT/ML IJ SOLN: 0.0000 [IU] | INTRAMUSCULAR | Status: DC | PRN

## 2023-11-02 MED ORDER — LIDOCAINE 2% (20 MG/ML) 5 ML SYRINGE
INTRAMUSCULAR | Status: DC | PRN
  Administered 2023-11-02: 50 mg via INTRAVENOUS

## 2023-11-02 MED ORDER — SUGAMMADEX SODIUM 200 MG/2ML IV SOLN
INTRAVENOUS | Status: DC | PRN
  Administered 2023-11-02: 200 mg via INTRAVENOUS

## 2023-11-02 MED ORDER — FENTANYL CITRATE (PF) 100 MCG/2ML IJ SOLN
25.0000 ug | INTRAMUSCULAR | Status: DC | PRN
  Administered 2023-11-02 (×2): 50 ug via INTRAVENOUS

## 2023-11-02 MED ORDER — ROCURONIUM BROMIDE 10 MG/ML (PF) SYRINGE
PREFILLED_SYRINGE | INTRAVENOUS | Status: DC | PRN
  Administered 2023-11-02: 10 mg via INTRAVENOUS
  Administered 2023-11-02: 50 mg via INTRAVENOUS

## 2023-11-02 MED ORDER — ACETAMINOPHEN 325 MG PO TABS: 650.0000 mg | ORAL_TABLET | ORAL | Status: DC | PRN

## 2023-11-02 MED ORDER — HEPARIN SODIUM (PORCINE) 1000 UNIT/ML IJ SOLN
INTRAMUSCULAR | Status: DC | PRN
  Administered 2023-11-02: 10000 [IU] via INTRAVENOUS
  Administered 2023-11-02: 1000 [IU] via INTRAVENOUS

## 2023-11-02 MED ORDER — FENTANYL CITRATE (PF) 100 MCG/2ML IJ SOLN
INTRAMUSCULAR | Status: AC
Start: 2023-11-02 — End: ?
  Filled 2023-11-02: qty 2

## 2023-11-02 MED ORDER — CHLORHEXIDINE GLUCONATE 4 % EX SOLN
Freq: Once | CUTANEOUS | Status: DC
  Filled 2023-11-02: qty 15

## 2023-11-02 MED ORDER — FENTANYL CITRATE (PF) 100 MCG/2ML IJ SOLN
INTRAMUSCULAR | Status: AC
  Filled 2023-11-02: qty 2

## 2023-11-02 MED ORDER — DIPHENHYDRAMINE HCL 25 MG PO CAPS
ORAL_CAPSULE | ORAL | Status: AC
  Administered 2023-11-02: 50 mg via ORAL
  Filled 2023-11-02: qty 2

## 2023-11-02 MED ORDER — ROPINIROLE HCL 0.25 MG PO TABS
0.2500 mg | ORAL_TABLET | Freq: Every day | ORAL | Status: DC
  Administered 2023-11-02: 0.25 mg via ORAL
  Filled 2023-11-02: qty 1

## 2023-11-02 MED ORDER — DIPHENHYDRAMINE HCL 25 MG PO CAPS: 50.0000 mg | ORAL_CAPSULE | ORAL | Status: AC

## 2023-11-02 MED ORDER — ESCITALOPRAM OXALATE 10 MG PO TABS
10.0000 mg | ORAL_TABLET | Freq: Every day | ORAL | Status: DC
  Administered 2023-11-03: 10 mg via ORAL
  Filled 2023-11-02: qty 1

## 2023-11-02 MED ORDER — SODIUM CHLORIDE 0.9% FLUSH
3.0000 mL | Freq: Two times a day (BID) | INTRAVENOUS | Status: DC
  Administered 2023-11-02: 3 mL via INTRAVENOUS

## 2023-11-02 MED ORDER — METOPROLOL TARTRATE 25 MG PO TABS
25.0000 mg | ORAL_TABLET | Freq: Two times a day (BID) | ORAL | Status: DC
  Administered 2023-11-02: 25 mg via ORAL
  Filled 2023-11-02 (×2): qty 1

## 2023-11-02 MED ORDER — LEVOTHYROXINE SODIUM 25 MCG PO TABS
125.0000 ug | ORAL_TABLET | Freq: Every day | ORAL | Status: DC
  Administered 2023-11-03: 125 ug via ORAL
  Filled 2023-11-02: qty 1

## 2023-11-02 MED ORDER — AMIODARONE HCL 200 MG PO TABS
200.0000 mg | ORAL_TABLET | Freq: Every day | ORAL | Status: DC
  Administered 2023-11-02 – 2023-11-03 (×2): 200 mg via ORAL
  Filled 2023-11-02 (×2): qty 1

## 2023-11-02 MED ORDER — ALBUMIN HUMAN 5 % IV SOLN
INTRAVENOUS | Status: DC | PRN
Start: 2023-11-02 — End: 2023-11-02

## 2023-11-02 MED ORDER — MAGNESIUM OXIDE -MG SUPPLEMENT 400 (240 MG) MG PO TABS
200.0000 mg | ORAL_TABLET | Freq: Every day | ORAL | Status: DC
  Administered 2023-11-03: 200 mg via ORAL
  Filled 2023-11-02: qty 1

## 2023-11-02 MED ORDER — PROPOFOL 10 MG/ML IV BOLUS
INTRAVENOUS | Status: DC | PRN
  Administered 2023-11-02: 150 mg via INTRAVENOUS

## 2023-11-02 MED ORDER — HEPARIN (PORCINE) IN NACL 2000-0.9 UNIT/L-% IV SOLN
INTRAVENOUS | Status: DC | PRN
  Administered 2023-11-02: 1000 mL

## 2023-11-02 MED ORDER — SODIUM CHLORIDE 0.9% FLUSH: 3.0000 mL | INTRAVENOUS | Status: DC | PRN

## 2023-11-02 MED ORDER — FLUTICASONE FUROATE-VILANTEROL 200-25 MCG/ACT IN AEPB
1.0000 | INHALATION_SPRAY | Freq: Every day | RESPIRATORY_TRACT | Status: DC
  Filled 2023-11-02: qty 28

## 2023-11-02 MED ORDER — PROTAMINE SULFATE 10 MG/ML IV SOLN
INTRAVENOUS | Status: DC | PRN
Start: 2023-11-02 — End: 2023-11-02
  Administered 2023-11-02: 35 mg via INTRAVENOUS

## 2023-11-02 MED ORDER — PANTOPRAZOLE SODIUM 40 MG PO TBEC
40.0000 mg | DELAYED_RELEASE_TABLET | Freq: Every day | ORAL | Status: DC
  Administered 2023-11-03: 40 mg via ORAL
  Filled 2023-11-02: qty 1

## 2023-11-02 MED ORDER — TRAZODONE HCL 50 MG PO TABS
150.0000 mg | ORAL_TABLET | Freq: Every day | ORAL | Status: DC
  Administered 2023-11-02: 150 mg via ORAL
  Filled 2023-11-02: qty 1

## 2023-11-02 MED ORDER — ENSURE PLUS HIGH PROTEIN PO LIQD: 237.0000 mL | Freq: Three times a day (TID) | ORAL | Status: DC

## 2023-11-02 MED ORDER — SENNA 8.6 MG PO TABS
2.0000 | ORAL_TABLET | Freq: Every day | ORAL | Status: DC
  Administered 2023-11-02: 17.2 mg via ORAL
  Filled 2023-11-02: qty 2

## 2023-11-02 MED ORDER — VITAMIN C 500 MG PO TABS
1000.0000 mg | ORAL_TABLET | Freq: Every day | ORAL | Status: DC
  Administered 2023-11-03: 1000 mg via ORAL
  Filled 2023-11-02: qty 2

## 2023-11-02 MED ORDER — FLUTICASONE FUROATE-VILANTEROL 200-25 MCG/ACT IN AEPB
1.0000 | INHALATION_SPRAY | Freq: Every day | RESPIRATORY_TRACT | Status: DC
  Filled 2023-11-02 (×2): qty 28

## 2023-11-02 MED ORDER — PHENYLEPHRINE HCL-NACL 20-0.9 MG/250ML-% IV SOLN
INTRAVENOUS | Status: DC | PRN
  Administered 2023-11-02: 10 ug/min via INTRAVENOUS

## 2023-11-02 MED ORDER — FLUOROMETHOLONE 0.1 % OP SUSP
1.0000 [drp] | Freq: Four times a day (QID) | OPHTHALMIC | Status: DC
  Administered 2023-11-02: 1 [drp] via OPHTHALMIC
  Filled 2023-11-02: qty 5

## 2023-11-02 MED ORDER — PHENYLEPHRINE 80 MCG/ML (10ML) SYRINGE FOR IV PUSH (FOR BLOOD PRESSURE SUPPORT)
PREFILLED_SYRINGE | INTRAVENOUS | Status: DC | PRN
  Administered 2023-11-02: 80 ug via INTRAVENOUS

## 2023-11-02 MED ORDER — FENTANYL CITRATE (PF) 250 MCG/5ML IJ SOLN
INTRAMUSCULAR | Status: DC | PRN
  Administered 2023-11-02 (×3): 50 ug via INTRAVENOUS

## 2023-11-02 SURGICAL SUPPLY — 18 items
BLANKET WARM UNDERBOD FULL ACC (MISCELLANEOUS) ×1 IMPLANT
CATH INFINITI 5FR ANG PIGTAIL (CATHETERS) IMPLANT
CATH WATCHMAN STEER ACCESS SYS (CATHETERS) IMPLANT
CLOSURE PERCLOSE PROSTYLE (VASCULAR PRODUCTS) IMPLANT
DEVICE WATCHMAN FLX PRO PROC (KITS) IMPLANT
DEVICE WATCHMAN TRUSTEER PROC (KITS) IMPLANT
KIT VERSACROSS CON 12FR 85 (KITS) IMPLANT
PACK CARDIAC CATHETERIZATION (CUSTOM PROCEDURE TRAY) ×1 IMPLANT
PAD DEFIB RADIO PHYSIO CONN (PAD) ×1 IMPLANT
SHEATH DILAT COONS TAPER 22F (SHEATH) IMPLANT
SHEATH PERFORMER 18FRX30 (VASCULAR PRODUCTS) IMPLANT
SHEATH PINNACLE 8F 10CM (SHEATH) IMPLANT
SHEATH PROBE COVER 6X72 (BAG) ×1 IMPLANT
SYR CONTROL 10ML ANGIOGRAPHIC (SYRINGE) IMPLANT
TRANSDUCER W/STOPCOCK (MISCELLANEOUS) ×1 IMPLANT
WATCHMAN FLX PRO 24 (Prosthesis & Implant Heart) IMPLANT
WATCHMAN FLX PRO PROCEDURE (KITS) ×1 IMPLANT
WATCHMAN TRUSTEER PROCEDURE (KITS) ×1 IMPLANT

## 2023-11-02 NOTE — Transfer of Care (Signed)
 Immediate Anesthesia Transfer of Care Note  Patient: [name]  Procedure(s) Performed: LEFT ATRIAL APPENDAGE OCCLUSION TRANSESOPHAGEAL ECHOCARDIOGRAM  Patient Location: Cath Lab  Anesthesia Type:General  Level of Consciousness: awake, alert , and oriented  Airway & Oxygen Therapy: Patient Spontanous Breathing  Post-op Assessment: Report given to RN and Post -op Vital signs reviewed and stable  Post vital signs: Reviewed and stable  Last Vitals:  Vitals Value Taken Time  BP 160/144 11/02/23 1400  Temp 36.5 C 11/02/23 1355  Pulse 73 11/02/23 1407  Resp 13 11/02/23 1407  SpO2 97 % 11/02/23 1407  Vitals shown include unfiled device data.  Last Pain:  Vitals:   11/02/23 1355  TempSrc: Axillary  PainSc: 0-No pain         Complications: There were no known notable events for this encounter.

## 2023-11-02 NOTE — Addendum Note (Signed)
 Addendum  created 11/02/23 1425 by Lethaniel Rave, MD   Attestation recorded in Intraprocedure, Intraprocedure Attestations filed

## 2023-11-02 NOTE — Discharge Summary (Incomplete)
 Electrophysiology Discharge Summary   Patient ID: [name],  MRN: [id number], DOB/AGE: [birth year] 78 y.o.  Admit date: 11/02/2023 Discharge date: 11/03/23  Primary Care Physician: [facility]  Primary Cardiologist: Dr. 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 Anemia  Followed/managed with her  oncology team   Procedures This Admission:  Transeptal Puncture Intra-procedural TEE which showed no LAA thrombus Left atrial appendage occlusive device placement on 11/02/23 by Dr. Daneil Dunker.  CONCLUSIONS:  1.Successful implantation of a 24mm WATCHMAN Flx Pro left atrial appendage occlusive device    2. TEE demonstrating no LAA thrombus 3. No early apparent complications.   Post Implant Anticoagulation Strategy: Continue Eliquis  5mg  BID for 45 days, then transition to DAPT with aspirin  81mg  and Plavix 75mg  daily for 6 months.   Brief HPI: Erin Virginia  [name] is a 78 y.o. female with a history of Paroxysmal Atrial Fibrillation who was referred to Electrophysiology in the outpatient setting    Hospital Course:  The patient was admitted and underwent left atrial appendage occlusive device placement as above.  The patient was monitored overnight and has done very well with no concerns. Groin site has been stable without evidence of hematoma or bleeding. Wound care and restrictions were reviewed with the patient.   The patient has been scheduled for post procedure follow up with EP APP in approximately 6 weeks. They will restart Eliquis  this evening and continue for 45 days then stop. At that time she will transition to  Plavix 75mg  daily to complete 6 months of therapy. They will require dental SBE for 6 month post op and should refrain from dental work or cleanings for the first 45 days post implant. SBE not necessary as pt is edentulous.   A repeat TEE due to contrast allergy (needs pre-med / DM). will be performed in approximately 60 days to ensure proper seal of the device.    Physical Exam: Vitals:   11/02/23 2303 11/03/23 0248 11/03/23 0455 11/03/23 0752  BP: (!) 107/59 (!) 99/57 (!) 99/48 (!) 98/48  Pulse: (!) 57 (!) 58 (!) 56 68  Resp: 17 17 17 20   Temp: 98 F (36.7 C) 97.9 F (36.6 C) 98 F (36.7 C) 98.9 F (37.2 C)  TempSrc: Oral Oral Oral Oral  SpO2: 96% 94% 96% 96%  Weight:      Height:        GEN: chronically ill appearing adult female, well developed in no acute distress NECK: No JVD; No carotid bruits CARDIAC: Regular rate and rhythm, no murmurs, rubs, gallops RESPIRATORY:  Clear to auscultation without rales, wheezing or rhonchi  ABDOMEN: Soft, non-tender, non-distended EXTREMITIES:  No edema; No deformity. Groin site Stable     Discharge Medications:  Allergies as of 11/03/2023       Reactions   Celebrex  [celecoxib ] Other (See Comments)   Contrast Media [iodinated Contrast Media] Hives, Swelling   Facial swelling   Oxycodone Other (See Comments)   Extreme drowsiness   Cephalosporins Itching   Codeine Other (See Comments)   Extreme drowsiness   Iodine Other (See Comments)   Critical        Medication List     TAKE these medications  acetaminophen  650 MG CR tablet Commonly known as: TYLENOL  Take 1,300 mg by mouth 2 (two) times daily.   albuterol  108 (90 Base) MCG/ACT inhaler Commonly known as: VENTOLIN  HFA Inhale 2 puffs into the lungs every 6 (six) hours as needed for wheezing.   amiodarone  200 MG tablet Commonly known as: PACERONE  Take 1 tablet (200 mg total) by mouth daily.   apixaban  5 MG Tabs tablet Commonly known as: ELIQUIS  Take 1 tablet (5 mg  total) by mouth 2 (two) times daily.   Aspercreme Lidocaine  4 % Liqd Generic drug: Lidocaine  HCl Apply 1 tablet topically in the morning, at noon, in the evening, and at bedtime. Roll-On to bilateral knees as needed   bisacodyl  5 MG EC tablet Commonly known as: DULCOLAX Take 5 mg by mouth daily as needed for moderate constipation. Take for three days in a row   busPIRone  5 MG tablet Commonly known as: BUSPAR  Take 5 mg by mouth 3 (three) times daily as needed (Anxiety).   Capsaicin 0.1 % Crea Apply 1 application  topically 3 (three) times daily as needed (Pain). Apply small amount to right knee   DHA OMEGA 3 PO Take 1,000 mg by mouth daily. Fish oil epa   diphenhydrAMINE  50 MG tablet Commonly known as: BENADRYL  Take 1 hour prior to CT scan for contrast allergy.   diphenhydrAMINE  25 mg capsule Commonly known as: BENADRYL  Take 50 mg by mouth.   Ensure Take 237 mLs by mouth 3 (three) times daily between meals. Enlive 0.08 gram-1.5 kcal/ml Lactose-reduced   escitalopram  10 MG tablet Commonly known as: LEXAPRO  Take 1 tablet (10 mg total) by mouth daily.   esomeprazole 40 MG capsule Commonly known as: NEXIUM Take 40 mg by mouth 2 (two) times daily.   ferrous sulfate  325 (65 FE) MG tablet Take 325 mg by mouth every Monday, Wednesday, and Friday.   fluorometholone  0.1 % ophthalmic suspension Commonly known as: FML Place 1 drop into both eyes 4 (four) times daily.   fluticasone  50 MCG/ACT nasal spray Commonly known as: FLONASE  Place 2 sprays into both nostrils daily.   fluticasone -salmeterol 115-21 MCG/ACT inhaler Commonly known as: ADVAIR HFA Inhale 2 puffs into the lungs 2 (two) times daily.   furosemide  40 MG tablet Commonly known as: LASIX  Take 40 mg by mouth daily.   GenTeal Tears Moderate PF 0.1-0.3 % Soln Generic drug: Dextran 70-Hypromellose (PF) Place 2 drops into both eyes 4 (four) times daily.   ipratropium-albuterol  0.5-2.5 (3) MG/3ML Soln Commonly  known as: DUONEB Take 3 mLs by nebulization every 6 (six) hours as needed (Cough , Shortness of breath or wheezing).   levothyroxine  125 MCG tablet Commonly known as: SYNTHROID  Take 125 mcg by mouth daily before breakfast. On empty stomach 30 Minutes before eating with 8 oz of water   lidocaine  5 % Commonly known as: LIDODERM  Place 1 patch onto the skin every 12 (twelve) hours. Right Knee   Magnesium  Oxide 250 MG Tabs Take 250 mg by mouth daily.   metoprolol  tartrate 25 MG tablet Commonly known as: LOPRESSOR  Take 1 tablet (25 mg total) by mouth 2 (two) times daily.   nitroGLYCERIN  0.4 MG SL tablet Commonly known as: NITROSTAT  Place 0.4 mg under the tongue every 5 (five) minutes as needed for chest pain.   olopatadine  0.1 % ophthalmic solution Commonly known as: PATANOL Place 1 drop into both eyes daily as needed (itchy watery eyes).   OXYGEN Inhale 2 L into the lungs daily as needed (During  COPD exacerbation- Use as needed for shortness of breath , plus ox <89%., and/or breathing faster than 20/ minute).   polyethylene glycol 17 g packet Commonly known as: MIRALAX  / GLYCOLAX  Take 17 g by mouth daily as needed for mild constipation or moderate constipation. Mixed with 8 oz of juice or water   potassium chloride  10 MEQ tablet Commonly known as: KLOR-CON  Take 10 mEq by mouth daily.   potassium chloride  8 MEQ tablet Commonly known as: KLOR-CON  Take 8 mEq by mouth at bedtime as needed (for leg cramps).   predniSONE  50 MG tablet Commonly known as: DELTASONE  Take one tablet 11/01/2023 at 10:30PM. Take the second tablet on 11/02/2023 at 4:30AM.   Refresh Optive Advanced PF 0.5-1-0.5 % Soln Generic drug: Carboxymeth-Glyc-Polysorb PF Place 1 drop into both eyes 4 (four) times daily as needed (dry eye).   rOPINIRole  0.25 MG tablet Commonly known as: REQUIP  Take 0.25 mg by mouth at bedtime.   senna 8.6 MG Tabs tablet Commonly known as: SENOKOT Take 1 tablet by mouth 2 (two)  times daily as needed for mild constipation or moderate constipation. Ex-Lax Maximum strength 25 mg tab   senna 8.6 MG Tabs tablet Commonly known as: SENOKOT Take 2 tablets by mouth daily.   traMADol  50 MG tablet Commonly known as: ULTRAM  Take 50 mg by mouth every 4 (four) hours as needed for moderate pain (pain score 4-6) (Mild pain).   traZODone  150 MG tablet Commonly known as: DESYREL  Take 150 mg by mouth at bedtime.   Turmeric 500 MG Tabs Take 500 mg by mouth daily.   vitamin A & D ointment Apply 1 Application topically 3 (three) times daily as needed for dry skin.   vitamin C  1000 MG tablet Take 1,000 mg by mouth daily.   VITAMIN K2-VITAMIN D3 PO Take 1 capsule by mouth daily. 10000 units/ 45 mcg        Disposition:  Home with usual follow up as in AVS   Signed, Creighton Doffing, NP-C, AGACNP-BC [facility] - Electrophysiology  11/03/2023, 10:01 AM   I have seen, examined the patient, and reviewed the above assessment and plan.    Hospital Course: Ms. Ruud is a 78 year old female with a past medical history notable for atrial fibrillation, falls w/ bleeding and chronic anemia who presented 5/29 for Watchman device implant. She underwent successful implant of a 24mm Watchman Flx Pro device. She was kept overnight for routine monitoring. No acute overnight events. Patient reports feeling well today. No new or acute complaints.   General: Well developed, in no acute distress.  Neck: No JVD.  Cardiac: Bradycardic, regular rhythm.  Resp: Normal work of breathing.  Ext: No edema.  Groins: Soft bilaterally with no bleeding or hematoma. Neuro: No gross focal deficits.  Psych: Normal affect.   Plan: Continue Eliquis  for 45 days then transition to DAPT with aspirin  81mg  and Plavix 75mg  daily for 6 months. Repeat CT scan in 60 days.   Duration of Discharge Encounter: 45 minutes  Ardeen Kohler, MD 11/03/2023 9:09 PM

## 2023-11-02 NOTE — Discharge Instructions (Signed)
 WATCHMANT Procedure, Care After  Procedure MD: Dr. Alene Mires Clinical Coordinator: Karsten Fells, RN  This sheet gives you information about how to care for yourself after your procedure. Your health care provider may also give you more specific instructions. If you have problems or questions, contact your health care provider.  What can I expect after the procedure? After the procedure, it is common to have: Bruising around your puncture site. Tenderness around your puncture site. Tiredness (fatigue).  Medication instructions It is very important to continue to take your blood thinner as directed by your doctor after the Watchman procedure. Call your procedure doctor's office with question or concerns. If you are on Coumadin (warfarin), you will have your INR checked the week after your procedure, with a goal INR of 2.0 - 3.0. Please follow your medication instructions on your discharge summary. Only take the medications listed on your discharge paperwork.  Follow up You will be seen in 6 weeks after your procedure You will have a repeat CT scan or Echocardiogram approximately 8 weeks after your procedure mark to check your device You will follow up the MD/APP who performed your procedure 6 months after your procedure The Watchman Clinical Coordinator will check in with you from time to time, including 1 and 2 years after your procedure.  NO DENTAL CLEANINGS FOR 45 days. After that, you will require antibiotics for dental procedures the first 6 months.   Follow these instructions at home: Puncture site care  Follow instructions from your health care provider about how to take care of your puncture site. Make sure you: If present, leave stitches (sutures), skin glue, or adhesive strips in place.  If a large square bandage is present, this may be removed 24 hours after surgery.  Check your puncture site every day for signs of infection. Check for: Redness, swelling, or pain. Fluid  or blood. If your puncture site starts to bleed, lie down on your back, apply firm pressure to the area, and contact your health care provider. Warmth. Pus or a bad smell. Driving Do not drive yourself home if you received sedation Do not drive for at least 4 days after your procedure or however long your health care provider recommends. (Do not resume driving if you have previously been instructed not to drive for other health reasons.) Do not spend greater than 1 hour at a time in a car for the first 3 days. Stop and take a break with a 5 minute walk at least every hour.  Do not drive or use heavy machinery while taking prescription pain medicine.  Activity Avoid activities that take a lot of effort, including exercise, for at least 7 days after your procedure. For the first 3 days, avoid sitting for longer than one hour at a time.  Avoid alcoholic beverages, signing paperwork, or participating in legal proceedings for 24 hours after receiving sedation Do not lift anything that is heavier than 10 lb (4.5 kg) for one week.  No sexual activity for 1 week.  Return to your normal activities as told by your health care provider. Ask your health care provider what activities are safe for you. General instructions Take over-the-counter and prescription medicines only as told by your health care provider. Do not use any products that contain nicotine or tobacco, such as cigarettes and e-cigarettes. If you need help quitting, ask your health care provider. You may shower after 24 hours, but Do not take baths, swim, or use a hot tub for  1 week.  Do not drink alcohol for 24 hours after your procedure. Keep all follow-up visits as told by your health care provider. This is important. Dental Work: You will require antibiotics prior to any dental work, including cleanings, for 6 months after your Watchman implantation to help protect you from infection. After 6 months, antibiotics are no longer  required. Contact a health care provider if: You have redness, mild swelling, or pain around your puncture site. You have soreness in your throat or at your puncture site that does not improve after several days You have fluid or blood coming from your puncture site that stops after applying firm pressure to the area. Your puncture site feels warm to the touch. You have pus or a bad smell coming from your puncture site. You have a fever. You have chest pain or discomfort that spreads to your neck, jaw, or arm. You are sweating a lot. You feel nauseous. You have a fast or irregular heartbeat. You have shortness of breath. You are dizzy or light-headed and feel the need to lie down. You have pain or numbness in the arm or leg closest to your puncture site. Get help right away if: Your puncture site suddenly swells. Your puncture site is bleeding and the bleeding does not stop after applying firm pressure to the area. These symptoms may represent a serious problem that is an emergency. Do not wait to see if the symptoms will go away. Get medical help right away. Call your local emergency services (911 in the [place] Do not drive yourself to the hospital. Summary After the procedure, it is normal to have bruising and tenderness at the puncture site in your groin, neck, or forearm. Check your puncture site every day for signs of infection. Get help right away if your puncture site is bleeding and the bleeding does not stop after applying firm pressure to the area. This is a medical emergency.  This information is not intended to replace advice given to you by your health care provider. Make sure you discuss any questions you have with your health care provider.

## 2023-11-02 NOTE — Anesthesia Procedure Notes (Signed)
 Procedure Name: Intubation Date/Time: 11/02/2023 12:27 PM  Performed by: Linard Reno, CRNAPre-anesthesia Checklist: Patient identified, Emergency Drugs available, Suction available and Patient being monitored Patient Re-evaluated:Patient Re-evaluated prior to induction Oxygen Delivery Method: Circle System Utilized Preoxygenation: Pre-oxygenation with 100% oxygen Induction Type: IV induction Ventilation: Mask ventilation without difficulty Laryngoscope Size: Mac and 3 Grade View: Grade I Tube type: Oral Tube size: 7.0 mm Number of attempts: 1 Airway Equipment and Method: Stylet and Oral airway Placement Confirmation: ETT inserted through vocal cords under direct vision, positive ETCO2 and breath sounds checked- equal and bilateral Secured at: 21 cm Tube secured with: Tape Dental Injury: Teeth and Oropharynx as per pre-operative assessment

## 2023-11-02 NOTE — H&P (Signed)
 Electrophysiology Office Note:   Date:  09/26/2023  ID:  [name] 21, 1947, MRN [id number]   Primary Cardiologist: None Electrophysiologist: Ardeen Kohler, MD       History of Present Illness:   Erin Wernette  [name] is a 78 y.o. 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    Discussed the use of AI scribe software for clinical note transcription with the patient, who gave verbal consent to proceed.   History of Present Illness Admitted to the hospital 04/2023 after a mechanical fall. She slipped and fell sustaining sacral fracture and scalp hematoma. Was previously on Eliquis , but this was stopped after fall. Also has anemia being managed by oncology with iron  infusions. Eliquis  2.5 mg twice has since been resumed. Patient has not had any bleeding issues so far. She is a high fall risk and reports recurrent falls for years. 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 No new or acute complaints today.     Interval: Converted to sinus with amiodarone . Presents today for scheduled Watchman. Reports feeling relatively well. No new or acute complaints.   Review of systems complete and found to be negative unless listed in HPI.    EP Information / Studies Reviewed:     EKG is not ordered today. EKG from 09/20/23 reviewed which showed AF.         Echo 04/2023:   1. Left ventricular ejection fraction, by estimation, is 55 to 60%. The  left ventricle has normal function. The left ventricle has no regional  wall motion abnormalities. Left ventricular diastolic parameters are  consistent with Grade I diastolic  dysfunction (impaired relaxation).   2. Right ventricular systolic function is normal. The right ventricular  size is normal. There is normal pulmonary artery systolic pressure.   3. Left atrial size was mildly dilated.    4. The mitral valve is normal in structure. Mild mitral valve  regurgitation. Mild to moderate mitral stenosis. The mean mitral valve  gradient is 6.0 mmHg. Severe mitral annular calcification.   5. The aortic valve is normal in structure. Aortic valve regurgitation is  trivial. No aortic stenosis is present.   6. The inferior vena cava is normal in size with greater than 50%  respiratory variability, suggesting right atrial pressure of 3 mmHg.    Nuclear Stress 05/24/22:    The study is normal. The study is low risk.   No ST deviation was noted.   LV perfusion is normal. There is no evidence of ischemia. There is no evidence of infarction.   Left ventricular function is normal. Nuclear stress EF: 82 %. The left ventricular ejection fraction is hyperdynamic (>65%).   1.  Normal left ventricular function 2.  No evidence for scar or ischemia 3.  Low risk study   Risk Assessment/Calculations:     CHA2DS2-VASc Score = 5   This indicates a 7.2% annual risk of stroke. The patient's score is based upon: CHF History: 0 HTN History: 1 Diabetes History: 1 Stroke History: 0 Vascular Disease History: 0 Age Score: 2 Gender Score: 1               Physical Exam:    Vitals:   11/02/23 0924 11/02/23 0946  BP: (!) 135/101 (!) 120/53  Pulse: 63   Resp: 18   Temp: 98 F (36.7 C)   SpO2: 97%  GEN: Well nourished, well developed in no acute distress NECK: No JVD CARDIAC: Normal rate, regular rhyth RESPIRATORY:  Clear to auscultation without rales, wheezing or rhonchi  ABDOMEN: Soft, non-distended EXTREMITIES:  No edema; No deformity    ASSESSMENT AND PLAN:   I have seen Erin Virginia  [name] in the office today who is being considered for a Watchman left atrial appendage closure device. I believe they will benefit from this procedure given their history of atrial fibrillation, CHA2DS2-VASc score of 5 and unadjusted ischemic stroke rate of 7.2% per year. Unfortunately, the  patient is not felt to be a long term anticoagulation candidate secondary to recurrent falls, bleeding, anemia. The patient's chart has been reviewed and I feel that they would be a candidate for short term oral anticoagulation after Watchman implant.    It is my belief that after undergoing a LAA closure procedure, Erin Virginia  [name] will not need long term anticoagulation which eliminates anticoagulation side effects and major bleeding risk.    Procedural risks for the Watchman implant have been reviewed with the patient including a 0.5% risk of stroke, <1% risk of perforation and <1% risk of device embolization. Other risks include bleeding, vascular damage, tamponade, worsening renal function, and death. The patient understands these risk and wishes to proceed.       The published clinical data on the safety and effectiveness of WATCHMAN include but are not limited to the following: - Holmes DR, Evalene Hilda, Sick P et al. for the PROTECT AF Investigators. Percutaneous closure of the left atrial appendage versus warfarin therapy for prevention of stroke in patients with atrial fibrillation: a randomised non-inferiority trial. Lancet 2009; 374: 534-42. Evalene Hilda, Doshi SK, Deloria Fetch D et al. on behalf of the PROTECT AF Investigators. Percutaneous Left Atrial Appendage Closure for Stroke Prophylaxis in Patients With Atrial Fibrillation 2.3-Year Follow-up of the PROTECT AF (Watchman Left Atrial Appendage System for Embolic Protection in Patients With Atrial Fibrillation) Trial. Circulation 2013; 127:720-729. - Alli O, Doshi S,  Kar S, Reddy VY, Sievert H et al. Quality of Life Assessment in the Randomized PROTECT AF (Percutaneous Closure of the Left Atrial Appendage Versus Warfarin Therapy for Prevention of Stroke in Patients With Atrial Fibrillation) Trial of Patients at Risk for Stroke With Nonvalvular Atrial Fibrillation. J Am Coll Cardiol 2013; 61:1790-8. Bartholome Ligas DR, Mario Sicard, Price M,  Whisenant B, Sievert H, Doshi S, Huber K, Reddy V. Prospective randomized evaluation of the Watchman left atrial appendage Device in patients with atrial fibrillation versus long-term warfarin therapy; the PREVAIL trial. Journal of the [employer] of Cardiology, Vol. 4, No. 1, 2014, 1-11. - Kar S, Doshi SK, Sadhu A, Horton R, Osorio J et al. Primary outcome evaluation of a next-generation left atrial appendage closure device: results from the PINNACLE FLX trial. Circulation 2021;143(18)1754-1762.      HAS-BLED score 3 Hypertension Yes  Abnormal renal and liver function (Dialysis, transplant, Cr >2.26 mg/dL /Cirrhosis or Bilirubin >2x Normal or AST/ALT/AP >3x Normal) No  Stroke No  Bleeding Yes  Labile INR (Unstable/high INR) No  Elderly (>65) Yes  Drugs or alcohol  (>= 8 drinks/week, anti-plt or NSAID) No    CHA2DS2-VASc Score = 5  The patient's score is based upon: CHF History: 0 HTN History: 1 Diabetes History: 1 Stroke History: 0 Vascular Disease History: 0 Age Score: 2 Gender Score: 1         ASSESSMENT AND PLAN: Paroxysmal Atrial Fibrillation (ICD10:  I48.0) The patient's CHA2DS2-VASc score  is 5, indicating a 7.2% annual risk of stroke. Currently rate controlled in AF today.  -Continue amiodarone  200mg  once daily.  -Continue metoprolol  XL 25mg  BID.    Secondary Hypercoagulable State (ICD10:  D68.69){ The patient is at significant risk for stroke/thromboembolism based upon her CHA2DS2-VASc Score of 5.  -Continue Eliquis  5mg  BID for 45 days, then transition to DAPT with aspirin  81mg  and Plavix 75mg  daily.    Follow up with Dr. Daneil Dunker 3 months after [facility].    Signed, Ardeen Kohler, MD

## 2023-11-02 NOTE — Anesthesia Postprocedure Evaluation (Signed)
 Anesthesia Post Note  Patient: Erin Virginia  [name]  Procedure(s) Performed: LEFT ATRIAL APPENDAGE OCCLUSION TRANSESOPHAGEAL ECHOCARDIOGRAM     Patient location during evaluation: PACU Anesthesia Type: General Level of consciousness: awake and alert Pain management: pain level controlled Vital Signs Assessment: post-procedure vital signs reviewed and stable Respiratory status: spontaneous breathing, nonlabored ventilation, respiratory function stable and patient connected to nasal cannula oxygen Cardiovascular status: blood pressure returned to baseline and stable Postop Assessment: no apparent nausea or vomiting Anesthetic complications: no   There were no known notable events for this encounter.  Last Vitals:  Vitals:   11/02/23 1415 11/02/23 1420  BP: 131/64 (!) 146/73  Pulse: 69 69  Resp: 17 (!) 25  Temp:    SpO2: 97% 97%    Last Pain:  Vitals:   11/02/23 1355  TempSrc: Axillary  PainSc: 0-No pain   Pain Goal:                   Lethaniel Rave

## 2023-11-02 NOTE — Anesthesia Preprocedure Evaluation (Addendum)
 Anesthesia Evaluation  Patient identified by MRN, date of birth, ID band Patient awake    Reviewed: Allergy & Precautions, NPO status , Patient's Chart, lab work & pertinent test results  Airway Mallampati: II  TM Distance: >3 FB Neck ROM: Full    Dental  (+) Edentulous Upper, Edentulous Lower   Pulmonary asthma , COPD,  COPD inhaler   Pulmonary exam normal        Cardiovascular hypertension, Pt. on medications and Pt. on home beta blockers + dysrhythmias Atrial Fibrillation  Rhythm:Regular Rate:Normal  ECHO:  1. Left ventricular ejection fraction, by estimation, is 55 to 60%. The  left ventricle has normal function. The left ventricle has no regional  wall motion abnormalities. Left ventricular diastolic parameters are  consistent with Grade I diastolic  dysfunction (impaired relaxation).   2. Right ventricular systolic function is normal. The right ventricular  size is normal. There is normal pulmonary artery systolic pressure.   3. Left atrial size was mildly dilated.   4. The mitral valve is normal in structure. Mild mitral valve  regurgitation. Mild to moderate mitral stenosis. The mean mitral valve  gradient is 6.0 mmHg. Severe mitral annular calcification.   5. The aortic valve is normal in structure. Aortic valve regurgitation is  trivial. No aortic stenosis is present.   6. The inferior vena cava is normal in size with greater than 50%  respiratory variability, suggesting right atrial pressure of 3 mmHg.      Neuro/Psych   Anxiety     negative neurological ROS     GI/Hepatic Neg liver ROS, hiatal hernia,GERD  Medicated,,  Endo/Other  diabetesHypothyroidism    Renal/GU   negative genitourinary   Musculoskeletal  (+) Arthritis , Osteoarthritis,    Abdominal Normal abdominal exam  (+)   Peds  Hematology  (+) Blood dyscrasia, anemia Lab Results      Component                Value               Date                       WBC                      8.6                 11/02/2023                HGB                      12.1                11/02/2023                HCT                      38.2                11/02/2023                MCV                      85.8                11/02/2023                PLT  122 (L)             11/02/2023             Lab Results      Component                Value               Date                      NA                       135                 11/02/2023                K                        4.2                 11/02/2023                CO2                      23                  11/02/2023                GLUCOSE                  179 (H)             11/02/2023                BUN                      22                  11/02/2023                CREATININE               0.78                11/02/2023                CALCIUM                   8.9                 11/02/2023                GFRNONAA                 >60                 11/02/2023              Anesthesia Other Findings   Reproductive/Obstetrics                             Anesthesia Physical Anesthesia Plan  ASA: 3  Anesthesia Plan: General   Post-op Pain Management:    Induction: Intravenous  PONV Risk Score and Plan: 3 and Ondansetron , Dexamethasone  and Treatment may vary due to age or medical condition  Airway Management Planned: Mask and Oral ETT  Additional Equipment: ClearSight  Intra-op Plan:   Post-operative Plan:  Extubation in OR  Informed Consent: I have reviewed the patients History and Physical, chart, labs and discussed the procedure including the risks, benefits and alternatives for the proposed anesthesia with the patient or authorized representative who has indicated his/her understanding and acceptance.     Dental advisory given  Plan Discussed with: CRNA  Anesthesia Plan Comments:        Anesthesia Quick  Evaluation

## 2023-11-02 NOTE — Progress Notes (Signed)
 [facility]  Post-Op Visit  Subjective   Visit Type:  ESTABLISHED PATIENT  Pt. Name: [name]  Pt. MRN: [id number]  DOB: 13-Aug-1945              Sex: female  Visit Date:  11/02/2023  Provider:  Christy Crandall, MD, DMD  Location of Care: [facility]  Procedure: Extraction #32  Date of Procedure/Surgery: 10/19/23    HPI:     Chief Complaint   Patient presents with    Post-procedure Visit      Follow up ext of #32       [name] is a/an 78 y.o. female who is 2 weeks s/p extraction of #32.   Patient complains of pain and discomfort following surgery which has since completely resolved.   Patient denies: chest pain, drainage, dysphagia, dysphonia, dyspnea, NVFC, swelling, shortness of breath, trismus.  System comments: Patient appears well, no facial swelling or acute distress.       Objective     Vitals:    11/02/23 1002   BP: 128/71   Pulse: 74   SpO2: 94%     Body mass index is 15.48 kg/m.    Physical Exam    Maxillofacial:   atraumatic  normo-cephalic  no facial swelling  no trismus  CN II-XII intact    Oral:   normal salivary flow, mucosa moist and pink  no vestibular edema/swelling/erythema  no uvular deviation  no signs of acute infection, FOM soft and non-tender  no purulence or drainage or fistulae noted, age-appropriate dentition  no soft tissue pathology  Site #32 partially mucosalized with some food impaction (food debris removed during exam today), no erythema, no s/s of infection       Assessment & Plan   [name] is a/an 78 y.o. female who is 2 weeks s/p extraction of #32. Wound is partially mucosalized with some minimal food debris. Provided patient with monojet syringe and instructions on how to flush the site with peridex  rinses. Recommend doing this after each meal. Otherwise, healing as expected with no s/s of infection. Given h/o bisphosphonate use, will continue to follow until there is full mucosal  coverage.    -Follow up in 2 weeks for wound check        Christy Crandall, DMD, MD  11/02/2023 10:19 AM  [facility]  [facility]  [address]  [address]  [place] [address]  Dept: [phone]

## 2023-11-03 ENCOUNTER — Other Ambulatory Visit (HOSPITAL_COMMUNITY): Payer: Self-pay

## 2023-11-03 DIAGNOSIS — I480 Paroxysmal atrial fibrillation: Secondary | ICD-10-CM | POA: Diagnosis not present

## 2023-11-03 MED FILL — Fentanyl Citrate Preservative Free (PF) Inj 100 MCG/2ML: INTRAMUSCULAR | Qty: 3 | Status: AC

## 2023-11-07 ENCOUNTER — Telehealth: Payer: Self-pay

## 2023-11-07 ENCOUNTER — Other Ambulatory Visit: Payer: Self-pay

## 2023-11-07 DIAGNOSIS — I4819 Other persistent atrial fibrillation: Secondary | ICD-10-CM

## 2023-11-07 DIAGNOSIS — Z95818 Presence of other cardiac implants and grafts: Secondary | ICD-10-CM

## 2023-11-07 NOTE — Telephone Encounter (Signed)
  [facility]   Watchman Team  Contacted the patient regarding discharge from [facility] on 11/03/2023  The patient understands to follow up with [name] on 12/18/2023. Rescheduled the visit to 1030 per patient request due to transportation.  Scheduled the patient for TEE on 01/03/2024 at 1100 with 1000 arrival time so she may prepare transportation. She understood she will get instructions at post-procedure visit.  The patient understands discharge instructions? Yes  The patient understands medications and regimen? Yes   The patient reports groin site looks healthy with no S/S of bleeding or infection.  The patient understands to call with any questions or concerns prior to scheduled visit.

## 2023-11-09 ENCOUNTER — Other Ambulatory Visit: Payer: Self-pay | Admitting: Gastroenterology

## 2023-11-09 DIAGNOSIS — D696 Thrombocytopenia, unspecified: Secondary | ICD-10-CM

## 2023-11-09 DIAGNOSIS — K7460 Unspecified cirrhosis of liver: Secondary | ICD-10-CM

## 2023-11-15 ENCOUNTER — Ambulatory Visit: Admit: 2023-11-15 | Discharge: 2023-11-15 | Payer: MEDICARE

## 2023-11-15 DIAGNOSIS — Z9889 Other specified postprocedural states: Secondary | ICD-10-CM

## 2023-11-15 NOTE — Progress Notes (Signed)
 [facility]  Post-Op Visit  Subjective   Visit Type:  POST OP  Pt. Name: [name]  Pt. MRN: [id number]  DOB: 1945/12/17              Sex: female  Visit Date:  11/15/2023  Provider:  Christy Crandall, MD, DMD  Location of Care: [facility] at [facility]  Procedure: Extraction #32  Date of Procedure/Surgery: 10/19/23    HPI:     Chief Complaint   Patient presents with    Follow Up - Oral Maxillofacial Surgery      Follow up from extraction #32       [name] is a/an 78 y.o. female who is 4 weeks s/p extraction of #32.   Patient denies any pain or discomfort. Has been compliant with peridex  rinses.    Patient denies: chest pain, drainage, dysphagia, dysphonia, dyspnea, NVFC, swelling, shortness of breath, trismus.  System comments: Patient appears well, no facial swelling or acute distress.       Objective     Vitals:    11/15/23 1211 11/15/23 1213   BP:  125/70   Pulse: 72    SpO2: 92%      Body mass index is 15.31 kg/m.    Physical Exam    Maxillofacial:   atraumatic  normo-cephalic  no facial swelling  no trismus  CN II-XII intact    Oral:   normal salivary flow, mucosa moist and pink  no vestibular edema/swelling/erythema  no uvular deviation  no signs of acute infection, FOM soft and non-tender  no purulence or drainage or fistulae noted, age-appropriate dentition  no soft tissue pathology  Site #32 fully mucosalized, no erythema, no s/s of infection       Assessment & Plan   [name] is a/an 78 y.o. female who is 4 weeks s/p extraction of #32. Wound is fully mucosalized, no concerns today. OK to follow up with general dentist for routine care.       -Follow up prn         Christy Crandall, DMD, MD  11/15/2023 12:20 PM  [facility]  [facility]  [address]  [address]  [place] [address]  Dept: [phone]

## 2023-11-16 ENCOUNTER — Encounter: Admit: 2023-11-16 | Discharge: 2023-11-16 | Payer: MEDICARE

## 2023-11-16 DIAGNOSIS — K5902 Outlet dysfunction constipation: Principal | ICD-10-CM

## 2023-11-16 NOTE — Progress Notes (Signed)
 Diagnosis:   1. Dyssynergic defecation                Referring Provider:Pegram, Bambi Lever, [employer]    Insurance plan:   Payer/Plan Subscr DOB Sex Relation Sub. Ins. ID Effective Group Num   1. MEDICARE - [name], [name] Dec 14, 1945 Female Self [id number] 04/06/14                                    [address]   2. GENERIC [name], [name] 10/07/1945 Female Self [id number] 03/06/21                                    [facility] [place]                         # of visits per insurance authorization:08/09/2023/TDC [id number]/[id number]/BENEFIT AUTH INFO  PT,OT, ST -PLAN: MEDICARE A AND B  PT & ST GET [id number] COMBINED PCY USED- $0 POSSIBLE 28 VISITS REMAINING     *ONCE PATIENT REACHES THERAPY CAP IF IT'S MED NEC TO CONTINUE   THERAPIST MUST NOTIFY IV TEAM TO ADD KX MODIFIERS AND     PT/OT/SP-PLAN:  PT. HAS [employer] SUPP[employer]MENT INS PLAN SECONDARY  FOLLOWS MEDICARE GUIDELINES, ONLY PAYS IF MEDICARE PAYS    # of visits per POC: 10   Date of Initial Eval: 08/24/23    Verified Signed POC:    [x]  YES  []   NO   Date: Attestation signed by Johnanna Mylar, CNP at 08/24/2023  2:04 PM     Rerouted to Referral Source for signature via EMR []  Fax []   Mail []    Date:  Routed to PCP (if other than referring) for signature via   EMR []  Fax []    Mail []   Date:    Was POC updated?   []   YES []   NO    Date:        If yes, Verified Signed POC:    []   YES  []   NO   Date:        Precautions: G1P1- cesarean delivery, Carpal tunnel on L; TKA; wrist fx surgery , osteoporosis, TSA    Subjective: 11/16/23: Pt states she is having more regular bowel movements. Still has some difficulty with incomplete emptying. Seems to have days where she completely empty and then a few days where it is just small amounts. Still feels like she has some abdominal distension. Taking metamucil. Does report some urinary urgency/UUI concerns. Would like to speak to her GYN about this      09/25/23: Pt reports she is not feeling great- feels like she ate something that didn't  agree with her. States she is still going through the cycle of feeling like she is backing up and then having a day with several bowel movements, even though she is having bowel movements more regularly. Feels like her belly feels very full after she eats.     09/11/23: Pt reports she is overall feeling ok. Feels like she is making progress. Still has periods of constipation- where she feels like she needs to go, bloated, but can't get things out. This is better than it was before. Saturday night she had to get up a few times to try. Sunday morning she was able to have  a more complete bowel movement. Feels like the massage is helping her manage her bloat.     09/04/23: Pt reports she feels like she is getting the hang of trying to contract and relax and pelvic floor. Pt is having bowel movements every day to every other day. States after bowel movements is when she generally feels bloated and full. Massage helped a little with this. States still feels like she is having incomplete emptying. She has been noticing an odor from vagina this is concerning to her.    08/29/23: Pt reports she is overall feeling good. Feels like the awareness of her bowel habits has really helped her understand her symptoms. She is now using a stool under her feet for bowel movements. Has stopped straining and is working on breathing lengthening technique for emptying. She has been having daily bowel movements and sticking with a bowel routine. Also has been doing the bowel massage which really helps.    History of Current Problem: [name] is a 78 y.o. female who presents today with chief complaint of bowel dysfunction. This has been present for about 2 years since having L TKA June 2023. Was taking pain medication and feels this is what started it. Previously would have constipation and then 1-2 days of diarrhea. Started a regimen of metamucil/miralax  about 1 year ago. Didn't help much. Had a colonoscopy which was fine. Has incomplete  emptying most of the time. States she stool is usually formed but not firm. Just doing metamucil every morning at this point. Husband passed away a couple years ago also recently retired- feels like this has changed her habits. Eating breakfast, lunch, dinner- very small amounts; usually not hungry. Pt has Osteoporosis. TSA August 25 2022- currently in PT for this because of a fall she had (pushed over by dog) after surgery. Hx of TKA. Hx of B wrist ORIF.     Objective: Posture:   Absence of TA or spinal ext activation in seated or standing postures    Significant thoracic kyphosis, lumbar lordosis, sway back posture  -spine rotated to L  -hip shifted to R     Abdominal Palpation: TTP R lower quadrant     Diastasis Recti: Not tested     Breathing: chest dominant     Lumbar ROM                      Flexion 75%   Extension  15%   SB  L 15%; R 25%   Rotation                               LE MMT       Left        Right          Hip flex 4/5 4/5   Hip exten       Hip abd  3/5 4-/5   Hip ER 4-/5 4-/5   Hip IR 4-/5 4-/5                                            LE  ROM       Left        Right          Hip flex 95 95   Hip exten  Hip abd       Hip add       Hip ER  35  20   Hip IR  15  0      Core Strength:       Sahrmann Core Stability Test      Level 0 Unable to achieve level 1 position         Level 1 Begin in supine, hook-lying position while abdominal hollowing. Slowly raise 1 leg to 100 deg of hip flexion with comfortable knee flexion. Bring opposite leg to same position.   Level 2  From hip flexed position, slowly lower 1 leg until heel contacts surface. Slide heel out to fully extend the knee. Return to starting flexed position.   Level 3  From hip flexed position, slowly lower 1 leg until heel is 12 cm above surface. Slide heel out to fully extend the knee. Return to starting flexed position.   Level 4  From hip flexed position, slowly lower both legs until heel contacts surface.  Slide heel out to fully  extend knees. Return to starting flexed position.   Level 5 From hip flexed position, slowly lower both legs until heels are 12 cm above surface. Slide heel out to fully extend knees. Return to starting flexed position.         External Perineal Observation  Voluntary contraction PFM PRESENT   Normal Involuntary contraction (with cough) ABSENT   Perineal descent with bearing down / PFM mobility No muscle lengthening- increased intraabdominal pressure   Scarring / General skin condition None      Internal Pelvic Floor Palpation  PFM tone Normal   Muscle Volume Normal   Sensation Normal   Tender-trigger points, bands, spasms None         PFM Contraction Ability  MMT 1+ to 2-/5 all superficial muscle   Voluntary Relax Good   Endurance (sec) 5   # Quick contractions in 10 sec Unable to fully relax between contraction   Elevation ABSENT   Co-contraction ABSENT- was able to perform after training, min TrA activation       Rectal assessment: good squeeze and lift with contraction                                   Good lengthening of PFM with bearing down- increased activation of rectal   sphincter    Verbal consent for internal assessment obtained      Activity Date: 08/2023 08/29/23 09/04/23 09/11/23 09/25/23 11/16/23    Visit #  1  Initial  eval 2 3 4 5 6    Breathing + PFM + TrA 5-10 breaths, various positions throughout day Practiced with internal rectal exam    Breathing+ lengthening PFM for bowel movements;    Attic, 1st floor, basement Reviewed in supine and seated Breathing + TrA + PFM x 10 breaths - cues to reduce upper abdominal compensation    TrA marches; x10 Reviewed      TrA marches: cues for coordinaition    360 breathing   Various positions throughouthe day Reviewed     Cat cow or pelvic tilts   X 10 Reviewed Reviewed    LTR    X 10 each direction Reviewed Reviewed                   CPT code  Daily treatment record Treatment Time  PT Evaluation: 97001 Initial Evaluation    Self care:     Therapeutic  Exercise: 97110     Therapeutic Activities: 97530 Pt education in PT eval findings, POC and information in anatomy & diagnosis provided.  -managing bowel movement  -bowel massage  -bowel routine  -how structural changes in spine and contribute to abdominal changes  -fiber intake  -normal bladder interval- every 2-4 hrs  -hydration  -bladder irritants  -brain/bladder connection   24   Manual therapy: 97140 ILU bowel massage- increased time spent cecum and sigmoid colon    Neuromuscular Re-education: [phone] Activities/exercise completed to strengthen core, improve pelvic muscle function and trunk stability and reduce pain in order to achieve goals:    See above for exercises    Internal assessment: not performed this visit    EXTERNAL RECTAL ASSESSMENT PERFORM: assesses ability to contract, relax, bear down; practiced exhale and lengthening- good downward movement    see above; rectal assessment performed today: practiced PFM lengthening with bearing down- tactile cues with tapping to relax PFM    -use of towel roll (seated) or mirror for tactile and visual cues for PFM lengthening       Pt response to treatment today:   Good    Next visit:      Total Treatment TIme: 24   PFM - Pelvic Floor Muscles, STM - soft tissue mobilization,  MFR - myofascial release    Patient Education: 09/25/23: - short 5-10 mins walk after meals for digestion  - reviewed bowel routine  09/11/23: discussed metamucil vs miralax - pt taking metamucil daily;   -edu pt on probiotics  -managing bloat: massage, prone lying  -importance of daily exercises- return to gym- Medicare will pay for this  09/04/23: - managing bloat: ILU massage, prone laying, TrA activation  -encourage pt to speak with gyn on odor as this can be an issue with PH balance   -reviewed the importance of breathing   -reviewed pushing strategies for bowel movements  08/29/23: edu pt on external anal sphincter and how contracting when bearing down can contribute to incomplete  emptying  -reviewed bowel habits: stool under feet, fiber 20-30 grams, bowel routine, bowel massage, emptying strategies  - concept of PFM attic, 1st floor, basement and when you are using the muscles in those different functions  08/24/23: -bowel routine: wake up, bowel massage, warm liquid, sit on toilet   -10 min max time on toilet  -no straining  -squatty potty  -importance of fiber: provided pt with fiber handout and recommendation 20-30 grams  -importance of hydration  -daily exercise  -ILU massage  -urgency and how brain bladder connection sometimes need retrained  -how posture impacts bowels      Assessment: Pt is overall doing well in regards to managing her constipation. She is now having a bowel movement every day to every other day. Still notes some abdominal distention and bowel incomplete emptying. Pt with complaints of changes in her abdominal anatomy and PT edu pt how this is likely related to spine related changes that have changed the structure of her abdominal cavity. This may not be something that can be correctly but can be worked on to make sure it doesn't worsen. Pt is also noted UUI recently but reported that she will like to talk to the doctor about this. Edu pt on bladder habits, bladder interval, the brain bladder connection, hydration, and bladder irritants and how this can optimize bladder function. At this time, pt feels  like she can manage her symptoms on her own. Due to this, D/C with comprehensive HEP    Pt is a 78 year old G1P1 female presenting with signs and symptoms consistent with constipation, abdominal pain, UUI  Pt demonstrated impaired motor control of PFM ( 2-) and TA, poor timing of PFMC with increased intra-abdominal pressure, poor postural awareness, and poor knowledge of good bowel and bladder habits.   Pt is a good candidate for physical therapy to address myofascial restrictions, joint hypo-mobility and malalignment, poor postural awareness, decreased core stability  and hip strength, and resulting biomechanical and functional impairments. Patient/family received education on the purpose of therapy, participated in the development of the POC and verbalized understanding and agreement of POC and goals.      Goals of Therapy:      STG Goals - to be achieved in 2 visits:                           Initial Status 08/24/2023 11/16/23   Good knowledge of appropriate voiding intervals, toileting posture, and hygiene Poor Knowledge of appropriate voiding intervals, toileting posture, and hygiene  -discussed toilet position MET   Demonstrates good knowledge and implementation of self-relaxation, self-correction of posture, self-help / pain management techniques  Poor knowledge of self-relaxation, posture, pain management techniques    MET   Independent in a progressive HEP to address functional limitations No current exercise program MET   LTG Goals - to be achieved in 10 visits:                           Initial Status 08/24/2023 11/16/23   Increased strength of PFM  to 2+/5 MMT     Endurance 7-10 sec x 10 reps, Quick flicks: 6-8 reps in a 10 sec interval for improved continence/sphincter control, pelvic organ support and pelvic ring stability trunk  PFM Contraction Ability  MMT 1+ to 2-/5 all superficial muscle   Voluntary Relax Good   Endurance (sec) 5   # Quick contractions in 10 sec Unable to fully relax between contraction   Elevation ABSENT   Co-contraction ABSENT- was able to perform after training, min TrA activation    DID not reassess this date     FM Contraction Ability  MMT 1+ to 2-/5 all superficial muscle   Voluntary Relax Good   Endurance (sec) 5   # Quick contractions in 10 sec Unable to fully relax between contraction   Elevation ABSENT   Co-contraction ABSENT- was able to perform after training, min TrA activation      Improved ROM of  lumbar spine/hips    Neutral lumbopelvic alignment Decreased ROM/flexibility of lumbar spine/hips  Lumbopelvic malalignment / postural  impairment PROGRESSING TOWARD   Good core support to maintain correct postural align during ex and ADL's          Increase LE strength of glute/hips for improved pelvic ring stability, posture & functional mobility Poor core support / posture alignment     Inability to activate TrA     Decreased LE strengths  PROGRESSING TOWARD         Able to utilize urge suppression & defer urination as appropriate.     Pt able to isolate [facility] with Knack maneuver to maintain continence with exertion (cough, lifting, walking, exercise)        Nocturia  2x/night     Urgency - unable  to defer urination >5 min  Urinary incontinence with urgency, PROGRESSING TOWARD   Bowel movements every 1-2 days Bowel movements 2-3 x week MET          PLAN:  D/C with comprehensive HEP    [name], PT, PT, DPT    This note serves as a Discharge Summary should the patient not return to physical therapy per the above plan of care.

## 2023-11-20 ENCOUNTER — Ambulatory Visit
Admission: RE | Admit: 2023-11-20 | Discharge: 2023-11-20 | Disposition: A | Discharge status: Home / Self Care | Payer: Medicare (Managed Care) | Source: Ambulatory Visit | Attending: Gastroenterology | Admitting: Gastroenterology

## 2023-11-20 ENCOUNTER — Inpatient Hospital Stay: Payer: Medicare (Managed Care) | Attending: Oncology

## 2023-11-20 DIAGNOSIS — D509 Iron deficiency anemia, unspecified: Secondary | ICD-10-CM | POA: Diagnosis present

## 2023-11-20 DIAGNOSIS — R634 Abnormal weight loss: Secondary | ICD-10-CM

## 2023-11-20 DIAGNOSIS — D696 Thrombocytopenia, unspecified: Secondary | ICD-10-CM | POA: Diagnosis present

## 2023-11-20 DIAGNOSIS — D72820 Lymphocytosis (symptomatic): Secondary | ICD-10-CM | POA: Diagnosis not present

## 2023-11-20 DIAGNOSIS — K7460 Unspecified cirrhosis of liver: Secondary | ICD-10-CM | POA: Insufficient documentation

## 2023-11-20 DIAGNOSIS — Z7902 Long term (current) use of antithrombotics/antiplatelets: Secondary | ICD-10-CM | POA: Insufficient documentation

## 2023-11-20 DIAGNOSIS — C8510 Unspecified B-cell lymphoma, unspecified site: Secondary | ICD-10-CM

## 2023-11-20 DIAGNOSIS — J90 Pleural effusion, not elsewhere classified: Secondary | ICD-10-CM | POA: Diagnosis not present

## 2023-11-20 LAB — CBC WITH DIFFERENTIAL (CANCER CENTER ONLY)
Abs Immature Granulocytes: 0.07 10*3/uL (ref 0.00–0.07)
Basophils Absolute: 0 10*3/uL (ref 0.0–0.1)
Basophils Relative: 1 %
Eosinophils Absolute: 0.1 10*3/uL (ref 0.0–0.5)
Eosinophils Relative: 1 %
HCT: 37.5 % (ref 36.0–46.0)
Hemoglobin: 12.2 g/dL (ref 12.0–15.0)
Immature Granulocytes: 1 %
Lymphocytes Relative: 20 %
Lymphs Abs: 1.4 10*3/uL (ref 0.7–4.0)
MCH: 27.9 pg (ref 26.0–34.0)
MCHC: 32.5 g/dL (ref 30.0–36.0)
MCV: 85.6 fL (ref 80.0–100.0)
Monocytes Absolute: 0.4 10*3/uL (ref 0.1–1.0)
Monocytes Relative: 6 %
Neutro Abs: 5 10*3/uL (ref 1.7–7.7)
Neutrophils Relative %: 71 %
Platelet Count: 179 10*3/uL (ref 150–400)
RBC: 4.38 MIL/uL (ref 3.87–5.11)
RDW: 17.4 % — ABNORMAL HIGH (ref 11.5–15.5)
WBC Count: 7 10*3/uL (ref 4.0–10.5)
nRBC: 0 % (ref 0.0–0.2)

## 2023-11-20 LAB — TECHNOLOGIST SMEAR REVIEW
Plt Morphology: NORMAL
RBC MORPHOLOGY: NORMAL
WBC MORPHOLOGY: NORMAL

## 2023-11-20 LAB — RETIC PANEL
Immature Retic Fract: 13 % (ref 2.3–15.9)
RBC.: 4.32 MIL/uL (ref 3.87–5.11)
Retic Count, Absolute: 92.9 10*3/uL (ref 19.0–186.0)
Retic Ct Pct: 2.2 % (ref 0.4–3.1)
Reticulocyte Hemoglobin: 29.7 pg (ref >27.9)

## 2023-11-20 LAB — IRON AND TIBC
Iron: 159 ug/dL (ref 28–170)
Saturation Ratios: 35 % — ABNORMAL HIGH (ref 10.4–31.8)
TIBC: 459 ug/dL — ABNORMAL HIGH (ref 250–450)
UIBC: 300 ug/dL

## 2023-11-20 LAB — FERRITIN: Ferritin: 91 ng/mL (ref 11–307)

## 2023-11-20 LAB — VITAMIN B12: Vitamin B-12: 397 pg/mL (ref 180–914)

## 2023-11-22 ENCOUNTER — Encounter: Payer: Self-pay | Admitting: Oncology

## 2023-11-28 ENCOUNTER — Other Ambulatory Visit: Payer: Medicare (Managed Care)

## 2023-11-29 ENCOUNTER — Inpatient Hospital Stay (HOSPITAL_BASED_OUTPATIENT_CLINIC_OR_DEPARTMENT_OTHER): Payer: Medicare (Managed Care) | Admitting: Oncology

## 2023-11-29 ENCOUNTER — Encounter: Payer: Self-pay | Admitting: Oncology

## 2023-11-29 ENCOUNTER — Inpatient Hospital Stay: Payer: Medicare (Managed Care)

## 2023-11-29 VITALS — BP 117/55 | HR 61 | Temp 98.2°F | Resp 16 | Wt 148.0 lb

## 2023-11-29 DIAGNOSIS — E1122 Type 2 diabetes mellitus with diabetic chronic kidney disease: Secondary | ICD-10-CM

## 2023-11-29 DIAGNOSIS — K7460 Unspecified cirrhosis of liver: Secondary | ICD-10-CM | POA: Diagnosis not present

## 2023-11-29 DIAGNOSIS — J90 Pleural effusion, not elsewhere classified: Secondary | ICD-10-CM | POA: Diagnosis not present

## 2023-11-29 DIAGNOSIS — N183 Chronic kidney disease, stage 3 unspecified: Secondary | ICD-10-CM

## 2023-11-29 DIAGNOSIS — D509 Iron deficiency anemia, unspecified: Secondary | ICD-10-CM

## 2023-11-29 NOTE — Assessment & Plan Note (Signed)
 Refer to GI, patient has establish care with Dr. Onita.

## 2023-11-29 NOTE — Assessment & Plan Note (Signed)
 Encourage oral hydration and avoid nephrotoxins.

## 2023-11-29 NOTE — Assessment & Plan Note (Addendum)
 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 Bone marrow negative for lymphoma involvement. Peripheral blood flow cytometry is negative. 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  second opinion at [facility] reviewed.  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 However, regardless of the exact histology,  would recommend similar management of an indolent, small volume B cell lymphoma resulting in recurrent effusions. Should patient experiences recurrent fluid accumulation, would recommend treatment with a course of Rituximab monotherapy   Pleural biopsy pathology review : While a B-cell predominance raises the possibility of a low-grade lymphoma, flow cytometry was negative for a clonal population and there is no overt immunophenotypic aberrancy.  The current findings are not diagnostic of lymphoma and pending outside DNA studies (B-cell clonality) should help to further exclude lymphoid malignancy.  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 B-cell gene arrangement is negative.   Breathing status is stable.  No recurrent pleural effusion. Recommend observation

## 2023-11-29 NOTE — Progress Notes (Signed)
 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 Bone marrow negative for lymphoma involvement. Peripheral blood flow cytometry is negative. 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  second opinion at [facility] reviewed.  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 However, regardless of the exact histology,  would recommend similar management of an indolent, small volume B cell lymphoma resulting in recurrent effusions. Should patient experiences recurrent fluid accumulation, would recommend treatment with a course of Rituximab monotherapy   Pleural biopsy pathology review : While a B-cell predominance raises the possibility of a low-grade lymphoma, flow cytometry was negative for a clonal population and there is no overt immunophenotypic aberrancy.  The current findings are not diagnostic of lymphoma and pending outside DNA studies (B-cell clonality) should help to further exclude lymphoid malignancy.  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 B-cell gene arrangement is negative.   Breathing status is stable.  No recurrent pleural  effusion. Recommend observation    Iron  deficiency anemia Labs are reviewed and discussed with patient. Lab Results  Component Value Date   HGB 12.2 11/20/2023   TIBC 459 (H) 11/20/2023   IRONPCTSAT 35 (H) 11/20/2023   FERRITIN 91 11/20/2023    Status post IV Venofer  treatments.  Hemoglobin and iron  panel have both improved.  Hold off Venofer  treatments.  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    Cirrhosis of liver without ascites (HCC) Refer to GI, patient has establish care with Dr. Onita.   Orders Placed This Encounter  Procedures   CBC with Differential (Cancer Center Only)    Standing Status:   Future    Expected Date:   05/30/2024    Expiration Date:   08/28/2024   CMP (Cancer Center only)    Standing Status:   Future    Expected Date:   05/30/2024    Expiration Date:   08/28/2024   Iron  and TIBC    Standing Status:   Future    Expected Date:   05/30/2024    Expiration Date:   08/28/2024   Ferritin    Standing Status:   Future    Expected Date:   05/30/2024    Expiration Date:   08/28/2024   Vitamin B12    Standing Status:   Future    Expected Date:   05/30/2024    Expiration Date:   08/28/2024   Retic Panel    Standing Status:   Future    Expected Date:   05/30/2024    Expiration Date:   08/28/2024   Follow-up 3 months.  All questions were answered. The patient knows to call the clinic with any problems, questions or concerns.  Zelphia Cap, MD, PhD [facility] Hematology Oncology 11/29/2023    HISTORY OF PRESENTING ILLNESS:  Erin Virginia  [name] 78 y.o. female presents to establish care for pleural effusion, unintentional weight loss.   She was recently hospitalized due to dyspnea and chest tightness.  04/07/2023  CT scan revealed significant pleural effusion, which was subsequently drained. Flow cytometry of the fluid  indicated potential lymphoma cells. The patient has been experiencing inconsistent breathing, with noticeable shortness of breath upon exertion, a significant change from her previous ability to walk for a mile multiple times a week. These symptoms began approximately two weeks prior to the consultation.  The patient's appetite has been notably decreased, with consumption of only one substantial meal per day. This has resulted in a weight loss of over twenty pounds in the past few months. The patient reports some sweating, not on a daily basis, for the past year.  The patient also reports a history of shoulder injury due to a fall down a flight of stairs in the 1990s, which required multiple surgeries. She currently manages the pain with Tramadol . The patient lives alone and has been experiencing difficulty with mobility due to her current health condition.  05/29/23  Pleura, flow cytometry:  -  No immunophenotypic evidence of a lymphoproliferative sorter (i.e. no  monoclonal B cells or immunophenotypically abnormal T cells detected.   A. PLEURA, BIOPSY #1: -  Pleura with prominent atypical lymphoid infiltrate, see note.  B. PLEURA, BIOPSY#2: -  Pleural with prominent atypical lymphoid infiltrate, see note  Note: The fragments of pleura having underlying population of predominantly small lymphocytes but also percolate into adjacent adipose tissue.  This lymphoid infiltrate is a mix of both B and T cells; however, the B cells do predominate and are the prominent cell that percolates into the fat.  These B cells are CD20/PAX5 positive and are essentially negative for both CD5 and CD10.  CD10/BCL6 do highlight a few small irregular germinal centers that are appropriately negative for Bcl-2 CD43 is appropriately coexpressed on the T cells and not on the B cells.  Cyclin D1 is negative.  The proliferation rate is appropriately high in the germinal centers but is low less than 5% elsewhere.   Flow cytometry was performed and identified polytypic B cells and no immunophenotypic abnormality in the T cells.  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  Molecular testing for B-cell clonality is pending.  Also clinically indicated the specimen could be sent for a NexGen lymphoid panel.  Clinical correlation recommended.   INTERVAL HISTORY Erin Virginia  [name] is a 78 y.o. female who has above history reviewed by me today presents for follow up visit for recurrent pleural effusion,  S/p pleural catheter removal.  + fatigue has improved. No SOB Denies hematochezia, hematuria, hematemesis, epistaxis, black tarry stool.  Intermittent right chest wall sharp pain at the sites of previous pleural /chest tube. She has been off Eliquis  currently.     MEDICAL HISTORY:  Past Medical History:  Diagnosis Date   A-fib (HCC)    Anemia    Anxiety    Arthritis    Asthma    Cirrhosis (HCC)    COPD (chronic obstructive pulmonary  disease) (HCC)    Diabetes mellitus without complication (HCC)    type 2   DVT (deep venous thrombosis) (HCC) 1980   GERD (gastroesophageal reflux disease)    History of hiatal hernia    Hypertension    Hypothyroidism    Lymphoma (HCC)     SURGICAL HISTORY: Past Surgical History:  Procedure Laterality Date   CATARACT EXTRACTION Bilateral    CHEST TUBE INSERTION Right 05/29/2023   Procedure: INSERTION PLEURAL DRAINAGE CATHETER;  Surgeon: Shyrl Linnie KIDD, MD;  Location: [facility] OR;  Service: Thoracic;  Laterality: Right;   CHOLECYSTECTOMY     COLONOSCOPY     ESOPHAGOGASTRODUODENOSCOPY (EGD) WITH PROPOFOL   N/A 01/17/2022   Procedure: ESOPHAGOGASTRODUODENOSCOPY (EGD) WITH PROPOFOL ;  Surgeon: Onita Elspeth Sharper, DO;  Location: [facility] ENDOSCOPY;  Service: Gastroenterology;  Laterality: N/A;   HUMERUS SURGERY Left    fracture (7 surgeries total)   IR BONE MARROW BIOPSY & ASPIRATION  04/26/2023   LEFT ATRIAL APPENDAGE OCCLUSION N/A 11/02/2023   Procedure: LEFT ATRIAL APPENDAGE OCCLUSION;  Surgeon: Kennyth Chew, MD;  [facility] INV[facility] CV LAB;  Service: Cardiovascular;  Laterality: N/A;   LOOP RECORDER INSERTION  05-Dec-2016   pt states battery died, not transmitting, just there   MENISCUS REPAIR Right 12/05/17   REMOVAL OF PLEURAL DRAINAGE CATHETER Right 07/05/2023   Procedure: REMOVAL OF PLEURAL DRAINAGE CATHETER;  Surgeon: Malka Domino, MD;  Location: [facility] ORS;  Service: Pulmonary;  Laterality: Right;   THYROIDECTOMY     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; reconstruction from hip bone   TONSILECTOMY, ADENOIDECTOMY, BILATERAL MYRINGOTOMY AND TUBES     TOTAL KNEE ARTHROPLASTY Right 03/14/2022   Procedure: TOTAL KNEE ARTHROPLASTY;  Surgeon: Lorelle Hussar, MD;  Location: [facility] ORS;  Service: Orthopedics;  Laterality: Right;   TOTAL SHOULDER REPLACEMENT Left    TRANSESOPHAGEAL ECHOCARDIOGRAM (CATH LAB) N/A 11/02/2023   Procedure: TRANSESOPHAGEAL ECHOCARDIOGRAM;  Surgeon: Kennyth Chew, MD;  Location[facility] INVASIVE CV LAB;  Service: Cardiovascular;  Laterality: N/A;   TUBAL LIGATION     VIDEO ASSISTED THORACOSCOPY Right 05/29/2023   Procedure: RIGHT VIDEO ASSISTED THORACOSCOPY FOR PLEURAL BIOPSY;  Surgeon: Shyrl Linnie KIDD, MD;  Location: [facility] OR;  Service: Thoracic;  Laterality: Right;   WRIST SURGERY Left    multiple fractures from falls (14 surgeries from wrist to shoulder)    SOCIAL HISTORY: Social History   Socioeconomic History   Marital status: Widowed    Spouse name: Not on file   Number of children: 6   Years of education: Not on file   Highest education level: Not on file   Occupational History   Not on file  Tobacco Use   Smoking status: Never    Passive exposure: Past   Smokeless tobacco: Never  Vaping Use   Vaping status: Never Used  Substance and Sexual Activity   Alcohol  use: Never   Drug use: Never   Sexual activity: Not Currently  Other Topics Concern   Not on file  Social History Narrative   Lives in senior housing in [place] (apt.)   Social Drivers of Health   Financial Resource Strain: Medium Risk (06/20/2023)   Received from [facility]   Overall Financial Resource Strain (CARDIA)    Difficulty of Paying Living Expenses: Somewhat hard  Food Insecurity: No Food Insecurity (11/02/2023)   Hunger Vital Sign    Worried About Running Out of Food in the Last Year: Never true    Ran Out of Food in the  Last Year: Never true  Transportation Needs: No Transportation Needs (11/02/2023)   PRAPARE - Administrator, Civil Service (Medical): No    Lack of Transportation (Non-Medical): No  Physical Activity: Not on file  Stress: Not on file  Social Connections: Unknown (11/02/2023)   Social Connection and Isolation Panel    Frequency of Communication with Friends and Family: More than three times a week    Frequency of Social Gatherings with Friends and Family: More than three times a week    Attends Religious Services: Not on file    Active Member of Clubs or Organizations: No    Attends Banker Meetings: Never    Marital Status: Widowed  Intimate Partner Violence: Not At Risk (11/02/2023)   Humiliation, Afraid, Rape, and Kick questionnaire    Fear of Current or Ex-Partner: No    Emotionally Abused: No    Physically Abused: No    Sexually Abused: No    FAMILY HISTORY: Family History  Problem Relation Age of Onset   Kidney disease Mother    Tuberculosis Father     ALLERGIES:  is allergic to celebrex  [celecoxib ], contrast media [iodinated contrast media], oxycodone, cephalosporins, codeine, and  iodine .  MEDICATIONS:  Current Outpatient Medications  Medication Sig Dispense Refill   acetaminophen  (TYLENOL ) 650 MG CR tablet Take 1,300 mg by mouth 2 (two) times daily.     albuterol  (VENTOLIN  HFA) 108 (90 Base) MCG/ACT inhaler Inhale 2 puffs into the lungs every 6 (six) hours as needed for wheezing.     amiodarone  (PACERONE ) 200 MG tablet Take 1 tablet (200 mg total) by mouth daily. 90 tablet 3   apixaban  (ELIQUIS ) 5 MG TABS tablet Take 1 tablet (5 mg total) by mouth 2 (two) times daily. 180 tablet 0   Ascorbic Acid  (VITAMIN C ) 1000 MG tablet Take 1,000 mg by mouth daily.     bisacodyl  (DULCOLAX) 5 MG EC tablet Take 5 mg by mouth daily as needed for moderate constipation. Take for three days in a row     busPIRone  (BUSPAR ) 5 MG tablet Take 5 mg by mouth 3 (three) times daily as needed (Anxiety).     Capsaicin 0.1 % CREA Apply 1 application  topically 3 (three) times daily as needed (Pain). Apply small amount to right knee     Carboxymeth-Glyc-Polysorb PF (REFRESH OPTIVE ADVANCED PF) 0.5-1-0.5 % SOLN Place 1 drop into both eyes 4 (four) times daily as needed (dry eye).     Dextran 70-Hypromellose, PF, (GENTEAL TEARS MODERATE PF) 0.1-0.3 % SOLN Place 2 drops into both eyes 4 (four) times daily.     Docosahexaenoic Acid (DHA OMEGA 3 PO) Take 1,000 mg by mouth daily. Fish oil epa     Ensure (ENSURE) Take 237 mLs by mouth 3 (three) times daily between meals. Enlive 0.08 gram-1.5 kcal/ml Lactose-reduced     ferrous sulfate  325 (65 FE) MG tablet Take 325 mg by mouth every Monday, Wednesday, and Friday.     fluorometholone  (FML) 0.1 % ophthalmic suspension Place 1 drop into both eyes 4 (four) times daily.     fluticasone  (FLONASE ) 50 MCG/ACT nasal spray Place 2 sprays into both nostrils daily.     fluticasone -salmeterol (ADVAIR HFA) 115-21 MCG/ACT inhaler Inhale 2 puffs into the lungs 2 (two) times daily.     furosemide  (LASIX ) 40 MG tablet Take 40 mg by mouth daily.     ipratropium-albuterol   (DUONEB) 0.5-2.5 (3) MG/3ML SOLN Take 3 mLs by nebulization every  6 (six) hours as needed (Cough , Shortness of breath or wheezing).     levothyroxine  (SYNTHROID ) 125 MCG tablet Take 125 mcg by mouth daily before breakfast. On empty stomach 30 Minutes before eating with 8 oz of water     lidocaine  (LIDODERM ) 5 % Place 1 patch onto the skin every 12 (twelve) hours. Right Knee     Lidocaine  HCl (ASPERCREME LIDOCAINE ) 4 % LIQD Apply 1 tablet topically in the morning, at noon, in the evening, and at bedtime. Roll-On to bilateral knees as needed     Magnesium  Oxide 250 MG TABS Take 250 mg by mouth daily.     metoprolol  tartrate (LOPRESSOR ) 25 MG tablet Take 1 tablet (25 mg total) by mouth 2 (two) times daily. 180 tablet 3   nitroGLYCERIN  (NITROSTAT ) 0.4 MG SL tablet Place 0.4 mg under the tongue every 5 (five) minutes as needed for chest pain.     olopatadine  (PATANOL) 0.1 % ophthalmic solution Place 1 drop into both eyes daily as needed (itchy watery eyes).     OXYGEN Inhale 2 L into the lungs daily as needed (During  COPD exacerbation- Use as needed for shortness of breath , plus ox <89%., and/or breathing faster than 20/ minute).     PLAVIX 75 MG tablet Take 75 mg by mouth daily.     polyethylene glycol (MIRALAX  / GLYCOLAX ) 17 g packet Take 17 g by mouth daily as needed for mild constipation or moderate constipation. Mixed with 8 oz of juice or water     potassium chloride  (KLOR-CON ) 10 MEQ tablet Take 10 mEq by mouth daily.     potassium chloride  (KLOR-CON ) 8 MEQ tablet Take 8 mEq by mouth at bedtime as needed (for leg cramps).     predniSONE  (DELTASONE ) 50 MG tablet Take one tablet 11/01/2023 at 10:30PM. Take the second tablet on 11/02/2023 at 4:30AM. 2 tablet 0   PROTONIX  40 MG tablet Take 40 mg by mouth 2 (two) times daily.     rOPINIRole  (REQUIP ) 0.25 MG tablet Take 0.25 mg by mouth at bedtime.     senna (SENOKOT) 8.6 MG TABS tablet Take 1 tablet by mouth 2 (two) times daily as needed for mild  constipation or moderate constipation. Ex-Lax Maximum strength 25 mg tab     senna (SENOKOT) 8.6 MG TABS tablet Take 2 tablets by mouth daily.     traMADol  (ULTRAM ) 50 MG tablet Take 50 mg by mouth every 4 (four) hours as needed for moderate pain (pain score 4-6) (Mild pain).     traZODone  (DESYREL ) 150 MG tablet Take 150 mg by mouth at bedtime.     Turmeric 500 MG TABS Take 500 mg by mouth daily.     Vitamin D -Vitamin K  (VITAMIN K2-VITAMIN D3 PO) Take 1 capsule by mouth daily. 10000 units/ 45 mcg     Vitamins A & D (VITAMIN A & D) ointment Apply 1 Application topically 3 (three) times daily as needed for dry skin.     diphenhydrAMINE  (BENADRYL ) 25 mg capsule Take 50 mg by mouth. (Patient not taking: Reported on 11/29/2023)     diphenhydrAMINE  (BENADRYL ) 50 MG tablet Take 1 hour prior to CT scan for contrast allergy. (Patient not taking: Reported on 11/29/2023) 1 tablet 0   escitalopram  (LEXAPRO ) 10 MG tablet Take 1 tablet (10 mg total) by mouth daily. (Patient not taking: Reported on 11/29/2023)     esomeprazole (NEXIUM) 40 MG capsule Take 40 mg by mouth 2 (two) times daily. (Patient  not taking: Reported on 11/29/2023)     No current facility-administered medications for this visit.    Review of Systems  Constitutional:  Positive for fatigue. Negative for appetite change, chills and fever.  HENT:   Negative for hearing loss and voice change.   Eyes:  Negative for eye problems.  Respiratory:  Negative for chest tightness, cough and shortness of breath.   Cardiovascular:  Negative for chest pain.  Gastrointestinal:  Negative for abdominal distention, abdominal pain and blood in stool.  Endocrine: Negative for hot flashes.  Genitourinary:  Negative for difficulty urinating and frequency.   Musculoskeletal:  Negative for arthralgias.  Skin:  Negative for itching and rash.  Neurological:  Negative for extremity weakness.  Hematological:  Negative for adenopathy.  Psychiatric/Behavioral:  Positive  for depression. Negative for confusion. The patient is nervous/anxious.      PHYSICAL EXAMINATION: ECOG PERFORMANCE STATUS: 1 - Symptomatic but completely ambulatory  Vitals:   11/29/23 1308  BP: (!) 117/55  Pulse: 61  Resp: 16  Temp: 98.2 F (36.8 C)  SpO2: 97%   Filed Weights   11/29/23 1308  Weight: 148 lb (67.1 kg)    Physical Exam Constitutional:      General: She is not in acute distress. HENT:     Head: Normocephalic and atraumatic.   Eyes:     General: No scleral icterus.   Cardiovascular:     Rate and Rhythm: Normal rate and regular rhythm.  Pulmonary:     Effort: Pulmonary effort is normal. No respiratory distress.     Comments: Decreased breath sound right lower lobe Abdominal:     General: There is no distension.     Palpations: Abdomen is soft.     Tenderness: There is no abdominal tenderness.   Musculoskeletal:        General: Normal range of motion.     Cervical back: Normal range of motion and neck supple.   Skin:    Findings: No erythema.   Neurological:     Mental Status: She is alert and oriented to person, place, and time. Mental status is at baseline.     Motor: No abnormal muscle tone.   Psychiatric:        Mood and Affect: Affect normal.      LABORATORY DATA:  I have reviewed the data as listed    Latest Ref Rng & Units 11/20/2023   11:09 AM 11/02/2023    9:30 AM 09/17/2023    8:51 AM  CBC  WBC 4.0 - 10.5 K/uL 7.0  8.6  5.9   Hemoglobin 12.0 - 15.0 g/dL 87.7  87.8  87.8   Hematocrit 36.0 - 46.0 % 37.5  38.2  38.5   Platelets 150 - 400 K/uL 179  122  125       Latest Ref Rng & Units 11/02/2023    9:30 AM 09/17/2023    8:51 AM 09/15/2023    5:37 PM  CMP  Glucose 70 - 99 mg/dL 820  869  815   BUN 8 - 23 mg/dL 22  20  18    Creatinine 0.44 - 1.00 mg/dL 9.21  9.20  9.15   Sodium 135 - 145 mmol/L 135  138  139   Potassium 3.5 - 5.1 mmol/L 4.2  3.5  3.5   Chloride 98 - 111 mmol/L 102  102  102   CO2 22 - 32 mmol/L 23  26  25     Calcium  8.9 - 10.3  mg/dL 8.9  9.2  9.0   Total Protein 6.5 - 8.1 g/dL  7.4  7.5   Total Bilirubin 0.0 - 1.2 mg/dL  0.7  0.6   Alkaline Phos 38 - 126 U/L  38  39   AST 15 - 41 U/L  18  25   ALT 0 - 44 U/L  13  14      RADIOGRAPHIC STUDIES: I have personally reviewed the radiological images as listed and agreed with the findings in the report. US  Abdomen Complete Result Date: 11/21/2023 CLINICAL DATA:  Nonalcoholic cirrhosis EXAM: ABDOMEN ULTRASOUND COMPLETE COMPARISON:  April 09, 2023 FINDINGS: Gallbladder: Surgically absent. Common bile duct: Diameter: 4.6 mm. Liver: No focal lesion identified. Within normal limits in parenchymal echogenicity. Slight nodular contour. Portal vein is patent on color Doppler imaging with normal direction of blood flow towards the liver. IVC: No abnormality visualized. Pancreas: Visualized portion unremarkable. Spleen: Size and appearance within normal limits. Right Kidney: Length: 11 cm. Echogenicity within normal limits. No mass or hydronephrosis visualized. Left Kidney: Length: 10.84 cm. Echogenicity within normal limits. No mass or hydronephrosis visualized. Abdominal aorta: No aneurysm visualized. Other findings: None. IMPRESSION: Slight nodular contour of the liver, consistent with cirrhosis. No focal liver lesion identified. Electronically Signed   By: Craig Farr M.D.   On: 11/21/2023 07:52   DG Chest 2 View Result Date: 11/02/2023 CLINICAL DATA:  Preop. Peri exist small atrial fibrillation. Recurrent falls. EXAM: CHEST - 2 VIEW COMPARISON:  09/17/2023 FINDINGS: The heart is stable in size. Unchanged mediastinal contours. Aortic atherosclerosis. Left chest wall loop recorder. No focal opacity. No pleural effusion, pneumothorax or pulmonary edema. Remote left rib fractures. Reverse left shoulder arthroplasty. IMPRESSION: No acute chest findings. Aortic Atherosclerosis (ICD10-I70.0). Electronically Signed   By: Andrea Gasman M.D.   On: 11/02/2023 23:05    ECHO TEE Result Date: 11/02/2023    TRANSESOPHOGEAL ECHO REPORT   Patient Name:   Erin VIRGINIA  [name] Date of Exam: 11/02/2023 Medical Rec #:  [id number]               Height:       61.0 in Accession #:    [id number]              Weight:       145.5 lb Date of Birth:  30-Nov-1945                BSA:          1.650 m Patient Age:    78 years                BP:           120/53 mmHg Patient Gender: F                       HR:           63 bpm. Exam Location:  Inpatient Procedure: Transesophageal Echo, 3D Echo, Color Doppler and Cardiac Doppler            (Both Spectral and Color Flow Doppler were utilized during            procedure). Indications:     I48.91* Unspecified atrial fibrillation  History:         Patient has prior history of Echocardiogram examinations, most                  recent 04/10/2023. COPD, Arrythmias:Atrial Fibrillation; Risk  Factors:Hypertension and Diabetes.  Sonographer:     Damien Senior RDCS Referring Phys:  [id number] [name] Diagnosing Phys: Stanly Leavens MD PROCEDURE: After discussion of the risks and benefits of a TEE, an informed consent was obtained from the patient. The transesophogeal probe was passed without difficulty through the esophogus of the patient. Sedation performed by different physician. The patient developed no complications during the procedure.  IMPRESSIONS  1. Left ventricular ejection fraction, by estimation, is 60 to 65%. The left ventricle has normal function.  2. Right ventricular systolic function is normal. The right ventricular size is normal.  3. Prior to procedure, patent left atrial appendage- windsock morphology.     Patient had measurements in the overlap of a 24 mm and 27 mm Watchman FLX pro.     A transeptal puncture was performed inferior and posterior to patients PFO.     A 27 mm Watchman FLX was attempted and recaptured related to overcompression.     A 24 mm Watchman FLX pro was placed. Average compression 23%. Variable  measurements related to blooming from mitral annular calcification. No leak. Transverse sinus is prominent in this study. No significant shoulder Successful Watchman placement.     . Left atrial size was moderately dilated. No left atrial/left atrial appendage thrombus was detected.  4. Right atrial size was moderately dilated.  5. Degenerative mitral stenosis- 3D MVA 2.04 cm2, PISA MVA 1.30 cm2 and 1.66cm2 by continuity equation. This is more consistent with progressive degenerative mitral stenosis. The mitral valve is degenerative. Mild mitral valve regurgitation. Moderate mitral stenosis. The mean mitral valve gradient is 7.0 mmHg with average heart rate of 59 bpm.  6. Tricuspid valve regurgitation is mild to moderate.  7. The aortic valve is tricuspid. Aortic valve regurgitation is mild. No aortic stenosis is present.  8. There is Moderate (Grade III) plaque involving the ascending aorta.  9. 3D performed of the LAA and demonstrates Succesfull Watchman placement with 3D MPR compression assessment. 10. Evidence of atrial level shunting detected by color flow Doppler. FINDINGS  Left Ventricle: Left ventricular ejection fraction, by estimation, is 60 to 65%. The left ventricle has normal function. The left ventricular internal cavity size was normal in size. Right Ventricle: The right ventricular size is normal. No increase in right ventricular wall thickness. Right ventricular systolic function is normal. Left Atrium: Prior to procedure, patent left atrial appendage- windsock morphology. Patient had measurements in the overlap of a 24 mm and 27 mm Watchman FLX pro. A transeptal puncture was performed inferior and posterior to patients PFO. A 27 mm Watchman FLX was attempted and recaptured related to overcompression. A 24 mm Watchman FLX pro was placed. Average compression 23%. Variable measurements related to blooming from mitral annular calcification. No leak. Transverse sinus is prominent in this study. No  significant shoulder Successful Watchman placement. Left atrial size was moderately dilated. No left atrial/left atrial appendage thrombus was detected. Right Atrium: Right atrial size was moderately dilated. Prominent Chiari network. Pericardium: Trivial pericardial effusion is present. Mitral Valve: Degenerative mitral stenosis- 3D MVA 2.04 cm2, PISA MVA 1.30 cm2 and 1.66cm2 by continuity equation. This is more consistent with progressive degenerative mitral stenosis. The mitral valve is degenerative in appearance. Mild mitral valve regurgitation. Moderate mitral valve stenosis. MV peak gradient, 12.5 mmHg. The mean mitral valve gradient is 7.0 mmHg with average heart rate of 59 bpm. Tricuspid Valve: The tricuspid valve is normal in structure. Tricuspid valve regurgitation is mild to moderate. No evidence of  tricuspid stenosis. Aortic Valve: The aortic valve is tricuspid. Aortic valve regurgitation is mild. No aortic stenosis is present. Pulmonic Valve: The pulmonic valve was normal in structure. Pulmonic valve regurgitation is not visualized. No evidence of pulmonic stenosis. Aorta: The aortic root is normal in size and structure. There is moderate (Grade III) plaque involving the ascending aorta. IAS/Shunts: Evidence of atrial level shunting detected by color flow Doppler. Additional Comments: 3D was performed not requiring image post processing on an independent workstation and was indeterminate. LEFT VENTRICLE PLAX 2D LVOT diam:     1.80 cm LV SV:         66 LV SV Index:   40 LVOT Area:     2.54 cm  AORTIC VALVE LVOT Vmax:   112.00 cm/s LVOT Vmean:  81.800 cm/s LVOT VTI:    0.258 m MITRAL VALVE              TRICUSPID VALVE MV Area VTI:  0.95 cm    TR Peak grad:   23.4 mmHg MV Peak grad: 12.5 mmHg   TR Vmax:        242.00 cm/s MV Mean grad: 7.0 mmHg MV Vmax:      1.77 m/s    SHUNTS MV Vmean:     126.0 cm/s  Systemic VTI:  0.26 m MR PISA: 7.60 cm         Systemic Diam: 1.80 cm Stanly Leavens MD  Electronically signed by Stanly Leavens MD Signature Date/Time: 11/02/2023/1:56:12 PM    Final    EP STUDY Result Date: 11/02/2023 CONCLUSIONS: 1.Successful implantation of a 24mm WATCHMAN Flx Pro left atrial appendage occlusive device   2. TEE demonstrating no LAA thrombus 3. No early apparent complications. Post Implant Anticoagulation Strategy: Continue Eliquis  5mg  BID for 45 days, then transition to DAPT with aspirin  81mg  and Plavix 75mg  daily for 6 months. [name], MD, [facility], [facility] Cardiac Electrophysiology

## 2023-11-29 NOTE — Assessment & Plan Note (Addendum)
 Labs are reviewed and discussed with patient. Lab Results  Component Value Date   HGB 12.2 11/20/2023   TIBC 459 (H) 11/20/2023   IRONPCTSAT 35 (H) 11/20/2023   FERRITIN 91 11/20/2023    Status post IV Venofer  treatments.  Hemoglobin and iron  panel have both improved.  Hold off Venofer  treatments.  Etiology of IDA, suspect GI blood loss

## 2023-11-30 ENCOUNTER — Ambulatory Visit: Payer: Medicare (Managed Care) | Admitting: Oncology

## 2023-11-30 ENCOUNTER — Ambulatory Visit: Payer: Medicare (Managed Care)

## 2023-12-11 ENCOUNTER — Ambulatory Visit: Payer: Medicare (Managed Care) | Admitting: Cardiology

## 2023-12-15 ENCOUNTER — Encounter: Payer: Self-pay | Admitting: Oncology

## 2023-12-18 ENCOUNTER — Ambulatory Visit: Payer: Medicare (Managed Care) | Admitting: Cardiology

## 2023-12-21 NOTE — Progress Notes (Unsigned)
 Electrophysiology Clinic Note    Date:  12/22/2023  Patient ID:  Erin Virginia  [name], Conant 1945/07/04, MRN [id number] PCP:  [facility]  Cardiologist:  Redell Cave, MD   Electrophysiologist:  [name], MD  Electrophysiology APP:  Abree Romick, NP     Discussed the use of AI scribe software for clinical note transcription with the patient, who gave verbal consent to proceed.   Patient Profile    Chief Complaint: Watchman follow-up  History of Present Illness: Erin Virginia  [name] is a 78 y.o. 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  She saw Dr. Kitty for EP evaluation 09/2023 to discuss watchman procedure. It was felt that with her frequent falls and anemia, long term anticoagulation shoul dbe avoided if possible. She is now s/p LAAO with watchman on 11/03/2023 without complications.  She has a contrast allergy and so is planned for a TEE to eval placement on 7/30.  On follow-up today, she is concerned about her medications. Her PCP [name] at [facility]) has made several medication changes and patient is unsure if they are appropriate. She brings in her pill packs that have eliquis , plavix, and 81mg  aspirin  stickers.  She has a busted blood vessel in R eye from sneezing, area is slowly healing. She also reports a nose bleed about a month ago that took about an hour to stop bleeding.   She denies chest pain, chest pressure, palpitations. She has noticed increased edema the past few days and is wearing compression socks.  She is edentulous.    Arrhythmia/Device History Amiodarone     ROS:  Please see the history of present illness. All other systems are reviewed and otherwise negative.    Physical Exam    VS:  BP 116/65 (BP Location: Left Arm, Patient  Position: Sitting, Cuff Size: Normal)   Pulse (!) 58   Ht 5' 1 (1.549 m)   SpO2 92%   BMI 27.96 kg/m  BMI: Body mass index is 27.96 kg/m.      Wt Readings from Last 3 Encounters:  11/29/23 148 lb (67.1 kg)  11/02/23 143 lb (64.9 kg)  10/31/23 145 lb 8 oz (66 kg)     GEN- The patient is well appearing, alert and oriented x 3 today. Small, Sub-conjunctival hemorrhage in R eye Lungs- Clear to ausculation bilaterally, normal work of breathing.  Heart- Regular rate and rhythm, no murmurs, rubs or gallops Extremities- 1+ peripheral edema, warm, dry   Studies Reviewed   Previous EP, cardiology notes.    EKG is ordered. Personal review of EKG from today shows:    EKG Interpretation Date/Time:  Friday December 22 2023 10:33:02 EDT Ventricular Rate:  58 PR Interval:  202 QRS Duration:  80 QT Interval:  500 QTC Calculation: 490 R Axis:   -33  Text Interpretation: Sinus bradycardia Left axis deviation prolonged, stable QT Confirmed by Reality Dejonge [phone]) on 12/22/2023 10:55:44 AM     Cardiac CT, 10/26/2023 1. LAA landing zone measures 22mm x 21mm in diameter with depth 14mm, suitable for placement of a 27mm FLX device  2. There is no thrombus in the left atrial appendage.  3. Coronary calcium  score 779 (88th percentile)  4. Severe mitral annular calcification  TTE, 04/10/2023  1. Left ventricular ejection fraction, by estimation, is 55 to 60%. The left ventricle  has normal function. The left ventricle has no regional wall motion abnormalities. Left ventricular diastolic parameters are consistent with Grade I diastolic dysfunction (impaired relaxation).   2. Right ventricular systolic function is normal. The right ventricular size is normal. There is normal pulmonary artery systolic pressure.   3. Left atrial size was mildly dilated.   4. The mitral valve is normal in structure. Mild mitral valve regurgitation. Mild to moderate mitral stenosis. The mean mitral valve gradient is 6.0  mmHg. Severe mitral annular calcification.   5. The aortic valve is normal in structure. Aortic valve regurgitation is trivial. No aortic stenosis is present.   6. The inferior vena cava is normal in size with greater than 50% respiratory variability, suggesting right atrial pressure of 3 mmHg.    Assessment and Plan     #) s/p LAAO with watchman device #) hypercoag d/t parox afib Watchman device placed 5/30  Clinic nurse called PCP office to inquire about blood thinner regimen- patient stopped eliquis  7/14, and started xarelto and ASA on 7/15 Continue 75mg  xarelto and 81mg  ASA for a total of 6 months (end 11/30), then continue 81mg  ASA only.  She has TEE scheduled soon to eval device placement Pre-procedure BMP, CBC today   #) parox AFib S/p DCCV 5/2, maintaining sinus rhythm EKG with prolonged, though stable QTC Continue 200mg  amiodarone  daily Recent LFTs and thyroid labs stable     Informed Consent   Shared Decision Making/Informed Consent   The risks [esophageal damage, perforation (1:10,000 risk), bleeding, pharyngeal hematoma as well as other potential complications associated with conscious sedation including aspiration, arrhythmia, respiratory failure and death], benefits (treatment guidance and diagnostic support) and alternatives of a transesophageal echocardiogram were discussed in detail with Ms. [name] and she is willing to proceed.       Current medicines are reviewed at length with the patient today.   The patient has concerns regarding her medicines.  The following changes were made today:   STOP eliquis  CONTINUE 75mg  plavix and 81mg  aspirin   Labs/ tests ordered today include:  Orders Placed This Encounter  Procedures   EKG 12-Lead     Disposition: Follow up with Dr. Kennyth or EP APP in 6 months  TEE scheduled next week to eval watchman     Signed, Luisfelipe Engelstad, NP  12/22/23  11:15 AM  Electrophysiology [facility]

## 2023-12-21 NOTE — H&P (View-Only) (Signed)
 Electrophysiology Clinic Note    Date:  12/22/2023  Patient ID:  Erin Virginia  [name], Conant 1945/07/04, MRN [id number] PCP:  [facility]  Cardiologist:  Redell Cave, MD   Electrophysiologist:  [name], MD  Electrophysiology APP:  Abree Romick, NP     Discussed the use of AI scribe software for clinical note transcription with the patient, who gave verbal consent to proceed.   Patient Profile    Chief Complaint: Watchman follow-up  History of Present Illness: Erin Virginia  [name] is a 78 y.o. 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  She saw Dr. Kitty for EP evaluation 09/2023 to discuss watchman procedure. It was felt that with her frequent falls and anemia, long term anticoagulation shoul dbe avoided if possible. She is now s/p LAAO with watchman on 11/03/2023 without complications.  She has a contrast allergy and so is planned for a TEE to eval placement on 7/30.  On follow-up today, she is concerned about her medications. Her PCP [name] at [facility]) has made several medication changes and patient is unsure if they are appropriate. She brings in her pill packs that have eliquis , plavix, and 81mg  aspirin  stickers.  She has a busted blood vessel in R eye from sneezing, area is slowly healing. She also reports a nose bleed about a month ago that took about an hour to stop bleeding.   She denies chest pain, chest pressure, palpitations. She has noticed increased edema the past few days and is wearing compression socks.  She is edentulous.    Arrhythmia/Device History Amiodarone     ROS:  Please see the history of present illness. All other systems are reviewed and otherwise negative.    Physical Exam    VS:  BP 116/65 (BP Location: Left Arm, Patient  Position: Sitting, Cuff Size: Normal)   Pulse (!) 58   Ht 5' 1 (1.549 m)   SpO2 92%   BMI 27.96 kg/m  BMI: Body mass index is 27.96 kg/m.      Wt Readings from Last 3 Encounters:  11/29/23 148 lb (67.1 kg)  11/02/23 143 lb (64.9 kg)  10/31/23 145 lb 8 oz (66 kg)     GEN- The patient is well appearing, alert and oriented x 3 today. Small, Sub-conjunctival hemorrhage in R eye Lungs- Clear to ausculation bilaterally, normal work of breathing.  Heart- Regular rate and rhythm, no murmurs, rubs or gallops Extremities- 1+ peripheral edema, warm, dry   Studies Reviewed   Previous EP, cardiology notes.    EKG is ordered. Personal review of EKG from today shows:    EKG Interpretation Date/Time:  Friday December 22 2023 10:33:02 EDT Ventricular Rate:  58 PR Interval:  202 QRS Duration:  80 QT Interval:  500 QTC Calculation: 490 R Axis:   -33  Text Interpretation: Sinus bradycardia Left axis deviation prolonged, stable QT Confirmed by Reality Dejonge [phone]) on 12/22/2023 10:55:44 AM     Cardiac CT, 10/26/2023 1. LAA landing zone measures 22mm x 21mm in diameter with depth 14mm, suitable for placement of a 27mm FLX device  2. There is no thrombus in the left atrial appendage.  3. Coronary calcium  score 779 (88th percentile)  4. Severe mitral annular calcification  TTE, 04/10/2023  1. Left ventricular ejection fraction, by estimation, is 55 to 60%. The left ventricle  has normal function. The left ventricle has no regional wall motion abnormalities. Left ventricular diastolic parameters are consistent with Grade I diastolic dysfunction (impaired relaxation).   2. Right ventricular systolic function is normal. The right ventricular size is normal. There is normal pulmonary artery systolic pressure.   3. Left atrial size was mildly dilated.   4. The mitral valve is normal in structure. Mild mitral valve regurgitation. Mild to moderate mitral stenosis. The mean mitral valve gradient is 6.0  mmHg. Severe mitral annular calcification.   5. The aortic valve is normal in structure. Aortic valve regurgitation is trivial. No aortic stenosis is present.   6. The inferior vena cava is normal in size with greater than 50% respiratory variability, suggesting right atrial pressure of 3 mmHg.    Assessment and Plan     #) s/p LAAO with watchman device #) hypercoag d/t parox afib Watchman device placed 5/30  Clinic nurse called PCP office to inquire about blood thinner regimen- patient stopped eliquis  7/14, and started xarelto and ASA on 7/15 Continue 75mg  xarelto and 81mg  ASA for a total of 6 months (end 11/30), then continue 81mg  ASA only.  She has TEE scheduled soon to eval device placement Pre-procedure BMP, CBC today   #) parox AFib S/p DCCV 5/2, maintaining sinus rhythm EKG with prolonged, though stable QTC Continue 200mg  amiodarone  daily Recent LFTs and thyroid labs stable     Informed Consent   Shared Decision Making/Informed Consent   The risks [esophageal damage, perforation (1:10,000 risk), bleeding, pharyngeal hematoma as well as other potential complications associated with conscious sedation including aspiration, arrhythmia, respiratory failure and death], benefits (treatment guidance and diagnostic support) and alternatives of a transesophageal echocardiogram were discussed in detail with Ms. [name] and she is willing to proceed.       Current medicines are reviewed at length with the patient today.   The patient has concerns regarding her medicines.  The following changes were made today:   STOP eliquis  CONTINUE 75mg  plavix and 81mg  aspirin   Labs/ tests ordered today include:  Orders Placed This Encounter  Procedures   EKG 12-Lead     Disposition: Follow up with Dr. Kennyth or EP APP in 6 months  TEE scheduled next week to eval watchman     Signed, Luisfelipe Engelstad, NP  12/22/23  11:15 AM  Electrophysiology [facility]

## 2023-12-22 ENCOUNTER — Ambulatory Visit: Payer: Medicare (Managed Care) | Attending: Cardiology | Admitting: Cardiology

## 2023-12-22 VITALS — BP 116/65 | HR 58 | Ht 61.0 in

## 2023-12-22 DIAGNOSIS — I480 Paroxysmal atrial fibrillation: Secondary | ICD-10-CM

## 2023-12-22 DIAGNOSIS — D6869 Other thrombophilia: Secondary | ICD-10-CM

## 2023-12-22 DIAGNOSIS — Z95818 Presence of other cardiac implants and grafts: Secondary | ICD-10-CM

## 2023-12-22 NOTE — Patient Instructions (Signed)
 Medication Instructions:  Stop date on Eliquis  5 mg (July 14th)   STARTING Plavix 75 mg daily, and aspirin  81 mg daily (July 15th)   *If you need a refill on your cardiac medications before your next appointment, please call your pharmacy*  Lab Work: Your provider would like for you to have following labs drawn today CBC, BMET.   If you have labs (blood work) drawn today and your tests are completely normal, you will receive your results only by: MyChart Message (if you have MyChart) OR A paper copy in the mail If you have any lab test that is abnormal or we need to change your treatment, we will call you to review the results.  Follow-Up: At [facility], you and your health needs are our priority.  As part of our continuing mission to provide you with exceptional heart care, our providers are all part of one team.  This team includes your primary Cardiologist (physician) and Advanced Practice Providers or APPs (Physician Assistants and Nurse Practitioners) who all work together to provide you with the care you need, when you need it.  Your next appointment:   6 month(s)  Provider:   [name], MD or [name], NP    We recommend signing up for the patient portal called MyChart.  Sign up information is provided on this After Visit Summary.  MyChart is used to connect with patients for Virtual Visits (Telemedicine).  Patients are able to view lab/test results, encounter notes, upcoming appointments, etc.  Non-urgent messages can be sent to your provider as well.   To learn more about what you can do with MyChart, go to [email or web address].   Other Instructions Follow printed letter for TEE instructions.

## 2023-12-23 LAB — CBC

## 2023-12-24 ENCOUNTER — Ambulatory Visit: Payer: Self-pay | Admitting: Cardiology

## 2023-12-26 LAB — CBC
Hematocrit: 39.9 (ref 34.0–46.6)
Hemoglobin: 12.9 g/dL (ref 11.1–15.9)
MCH: 29.2 pg (ref 26.6–33.0)
MCHC: 32.3 g/dL (ref 31.5–35.7)
MCV: 90 fL (ref 79–97)
Platelets: 150 x10E3/uL (ref 150–450)
RBC: 4.42 x10E6/uL (ref 3.77–5.28)
RDW: 14.6 (ref 11.7–15.4)
WBC: 8.2 x10E3/uL (ref 3.4–10.8)

## 2023-12-26 LAB — BASIC METABOLIC PANEL WITH GFR
BUN/Creatinine Ratio: 17 (ref 12–28)
BUN: 14 mg/dL (ref 8–27)
CO2: 21 mmol/L (ref 20–29)
Calcium: 9.2 mg/dL (ref 8.7–10.3)
Chloride: 98 mmol/L (ref 96–106)
Creatinine, Ser: 0.84 mg/dL (ref 0.57–1.00)
Glucose: 116 mg/dL — ABNORMAL HIGH (ref 70–99)
Potassium: 4.3 mmol/L (ref 3.5–5.2)
Sodium: 138 mmol/L (ref 134–144)
eGFR: 71 mL/min/1.73 (ref >59)

## 2024-01-02 NOTE — Progress Notes (Signed)
 Spoke to patient and instructed them to come at 1000  and to be NPO after 0000.     Confirmed that patient will have a ride home and someone to stay with them for 24 hours after the procedure.   Medications reviewed.

## 2024-01-03 ENCOUNTER — Encounter (HOSPITAL_COMMUNITY)
Admission: RE | Disposition: A | Discharge status: Home / Self Care | Payer: Self-pay | Source: Home / Self Care | Attending: Cardiology

## 2024-01-03 ENCOUNTER — Ambulatory Visit (HOSPITAL_BASED_OUTPATIENT_CLINIC_OR_DEPARTMENT_OTHER)

## 2024-01-03 ENCOUNTER — Encounter (HOSPITAL_COMMUNITY): Payer: Self-pay | Admitting: Cardiology

## 2024-01-03 ENCOUNTER — Ambulatory Visit (HOSPITAL_COMMUNITY)
Admission: RE | Admit: 2024-01-03 | Discharge: 2024-01-03 | Disposition: A | Discharge status: Home / Self Care | Payer: Medicare (Managed Care) | Attending: Cardiology | Admitting: Cardiology

## 2024-01-03 ENCOUNTER — Other Ambulatory Visit: Payer: Self-pay

## 2024-01-03 ENCOUNTER — Ambulatory Visit (HOSPITAL_COMMUNITY): Payer: Self-pay | Admitting: Registered Nurse

## 2024-01-03 DIAGNOSIS — Z7901 Long term (current) use of anticoagulants: Secondary | ICD-10-CM | POA: Insufficient documentation

## 2024-01-03 DIAGNOSIS — I340 Nonrheumatic mitral (valve) insufficiency: Secondary | ICD-10-CM | POA: Diagnosis not present

## 2024-01-03 DIAGNOSIS — I10 Essential (primary) hypertension: Secondary | ICD-10-CM | POA: Diagnosis not present

## 2024-01-03 DIAGNOSIS — I3139 Other pericardial effusion (noninflammatory): Secondary | ICD-10-CM | POA: Insufficient documentation

## 2024-01-03 DIAGNOSIS — Z95818 Presence of other cardiac implants and grafts: Secondary | ICD-10-CM

## 2024-01-03 DIAGNOSIS — Z87891 Personal history of nicotine dependence: Secondary | ICD-10-CM | POA: Insufficient documentation

## 2024-01-03 DIAGNOSIS — J449 Chronic obstructive pulmonary disease, unspecified: Secondary | ICD-10-CM | POA: Diagnosis not present

## 2024-01-03 DIAGNOSIS — I4819 Other persistent atrial fibrillation: Secondary | ICD-10-CM | POA: Diagnosis present

## 2024-01-03 DIAGNOSIS — K219 Gastro-esophageal reflux disease without esophagitis: Secondary | ICD-10-CM | POA: Insufficient documentation

## 2024-01-03 DIAGNOSIS — I351 Nonrheumatic aortic (valve) insufficiency: Secondary | ICD-10-CM | POA: Diagnosis not present

## 2024-01-03 DIAGNOSIS — I080 Rheumatic disorders of both mitral and aortic valves: Secondary | ICD-10-CM | POA: Insufficient documentation

## 2024-01-03 DIAGNOSIS — Z79899 Other long term (current) drug therapy: Secondary | ICD-10-CM | POA: Diagnosis not present

## 2024-01-03 DIAGNOSIS — I4891 Unspecified atrial fibrillation: Secondary | ICD-10-CM | POA: Diagnosis not present

## 2024-01-03 HISTORY — PX: TRANSESOPHAGEAL ECHOCARDIOGRAM (CATH LAB): EP1270

## 2024-01-03 LAB — ECHO TEE

## 2024-01-03 SURGERY — TRANSESOPHAGEAL ECHOCARDIOGRAM (TEE) (CATHLAB)
Anesthesia: Monitor Anesthesia Care

## 2024-01-03 MED ORDER — EPHEDRINE SULFATE-NACL 50-0.9 MG/10ML-% IV SOSY
PREFILLED_SYRINGE | INTRAVENOUS | Status: DC | PRN
  Administered 2024-01-03 (×2): 10 mg via INTRAVENOUS
  Administered 2024-01-03: 5 mg via INTRAVENOUS

## 2024-01-03 MED ORDER — PHENYLEPHRINE 80 MCG/ML (10ML) SYRINGE FOR IV PUSH (FOR BLOOD PRESSURE SUPPORT)
PREFILLED_SYRINGE | INTRAVENOUS | Status: DC | PRN
Start: 2024-01-03 — End: 2024-01-03
  Administered 2024-01-03: 80 ug via INTRAVENOUS
  Administered 2024-01-03: 160 ug via INTRAVENOUS

## 2024-01-03 MED ORDER — PROPOFOL 10 MG/ML IV BOLUS
INTRAVENOUS | Status: DC | PRN
  Administered 2024-01-03: 100 ug/kg/min via INTRAVENOUS
  Administered 2024-01-03: 50 mg via INTRAVENOUS

## 2024-01-03 MED ORDER — SODIUM CHLORIDE 0.9% FLUSH: 3.0000 mL | Freq: Two times a day (BID) | INTRAVENOUS | Status: DC

## 2024-01-03 MED ORDER — LIDOCAINE 2% (20 MG/ML) 5 ML SYRINGE
INTRAMUSCULAR | Status: DC | PRN
  Administered 2024-01-03: 80 mg via INTRAVENOUS

## 2024-01-03 MED ORDER — SODIUM CHLORIDE 0.9% FLUSH: 3.0000 mL | INTRAVENOUS | Status: DC | PRN

## 2024-01-03 MED ORDER — SODIUM CHLORIDE 0.9 % IV SOLN: INTRAVENOUS | Status: DC | PRN

## 2024-01-03 NOTE — CV Procedure (Signed)
     TRANSESOPHAGEAL ECHOCARDIOGRAM   NAME:  [name]   MRN: [id number] DOB:  02-20-1946   ADMIT DATE: 01/03/2024  INDICATIONS: Post Watchman  PROCEDURE:   Informed consent was obtained prior to the procedure. The risks, benefits and alternatives for the procedure were discussed and the patient comprehended these risks.  Risks include, but are not limited to, cough, sore throat, vomiting, nausea, somnolence, esophageal and stomach trauma or perforation, bleeding, low blood pressure, aspiration, pneumonia, infection, trauma to the teeth and death.    After a procedural time-out, the oropharynx was anesthetized and the patient was sedated by the anesthesia service. The transesophageal probe was inserted in the esophagus and stomach without difficulty and multiple views were obtained. Anesthesia was monitored by Ranell Banker, CRNA.    COMPLICATIONS:    There were no immediate complications.  FINDINGS: S/p Watchman with no peridevice leak or device related thrombus.    Severe mitral annular calcifications.  Moderate MR.  Moderate MS   Lonni Nanas MD [place]  [facility]    4:15 PM

## 2024-01-03 NOTE — Anesthesia Preprocedure Evaluation (Signed)
 Anesthesia Evaluation  Patient identified by MRN, date of birth, ID band Patient awake    Reviewed: Allergy & Precautions, NPO status , Patient's Chart, lab work & pertinent test results  History of Anesthesia Complications Negative for: history of anesthetic complications  Airway Mallampati: II  TM Distance: >3 FB Neck ROM: Full    Dental  (+) Edentulous Upper, Edentulous Lower   Pulmonary asthma , neg sleep apnea, COPD,  COPD inhaler and oxygen dependent, Patient abstained from smoking.Not current smoker 2L/min O2 at night   Pulmonary exam normal        Cardiovascular Exercise Tolerance: Good METShypertension, Pt. on medications and Pt. on home beta blockers (-) CAD and (-) Past MI + dysrhythmias Atrial Fibrillation  Rhythm:Regular Rate:Normal  Post-watchman TEE:  1. Left ventricular ejection fraction, by estimation, is 60 to 65%. The  left ventricle has normal function.   2. Right ventricular systolic function is normal. The right ventricular  size is normal.   3. Prior to procedure, patent left atrial appendage- windsock morphology.      Patient had measurements in the overlap of a 24 mm and 27 mm Watchman  FLX pro.      A transeptal puncture was performed inferior and posterior to patients  PFO.     A 27 mm Watchman FLX was attempted and recaptured related to  overcompression.     A 24 mm Watchman FLX pro was placed. Average compression 23%. Variable  measurements related to blooming from mitral annular calcification. No  leak. Transverse sinus is prominent in this study. No significant shoulder  Successful Watchman placement.      . Left atrial size was moderately dilated. No left atrial/left atrial  appendage thrombus was detected.   4. Right atrial size was moderately dilated.   5. Degenerative mitral stenosis- 3D MVA 2.04 cm2, PISA MVA 1.30 cm2 and  1.66cm2 by continuity equation. This is more consistent with  progressive  degenerative mitral stenosis. The mitral valve is degenerative. Mild  mitral valve regurgitation. Moderate  mitral stenosis. The mean mitral valve gradient is 7.0 mmHg with average  heart rate of 59 bpm.   6. Tricuspid valve regurgitation is mild to moderate.   7. The aortic valve is tricuspid. Aortic valve regurgitation is mild. No  aortic stenosis is present.   8. There is Moderate (Grade III) plaque involving the ascending aorta.   9. 3D performed of the LAA and demonstrates Succesfull Watchman placement  with 3D MPR compression assessment.  10. Evidence of atrial level shunting detected by color flow Doppler.     Neuro/Psych  PSYCHIATRIC DISORDERS Anxiety     negative neurological ROS     GI/Hepatic Neg liver ROS, hiatal hernia,GERD  Medicated,,  Endo/Other  diabetesHypothyroidism    Renal/GU CRFRenal disease  negative genitourinary   Musculoskeletal  (+) Arthritis , Osteoarthritis,    Abdominal Normal abdominal exam  (+)   Peds  Hematology  (+) Blood dyscrasia, anemia Lab Results      Component                Value               Date                      WBC                      8.6  11/02/2023                HGB                      12.1                11/02/2023                HCT                      38.2                11/02/2023                MCV                      85.8                11/02/2023                PLT                      122 (L)             11/02/2023             Lab Results      Component                Value               Date                      NA                       135                 11/02/2023                K                        4.2                 11/02/2023                CO2                      23                  11/02/2023                GLUCOSE                  179 (H)             11/02/2023                BUN                      22                  11/02/2023                CREATININE                0.78                11/02/2023  CALCIUM                   8.9                 11/02/2023                GFRNONAA                 >60                 11/02/2023              Anesthesia Other Findings Past Medical History: No date: A-fib (HCC) No date: Anemia No date: Anxiety No date: Arthritis No date: Asthma No date: Cirrhosis (HCC) No date: COPD (chronic obstructive pulmonary disease) (HCC) No date: Diabetes mellitus without complication (HCC)     Comment:  type 2 1980: DVT (deep venous thrombosis) (HCC) No date: GERD (gastroesophageal reflux disease) No date: History of hiatal hernia No date: Hypertension No date: Hypothyroidism No date: Lymphoma (HCC)  Reproductive/Obstetrics                              Anesthesia Physical Anesthesia Plan  ASA: 3  Anesthesia Plan: MAC   Post-op Pain Management: Minimal or no pain anticipated   Induction: Intravenous  PONV Risk Score and Plan: 1 and Propofol  infusion, TIVA and Ondansetron   Airway Management Planned: Nasal Cannula  Additional Equipment: None  Intra-op Plan:   Post-operative Plan:   Informed Consent: I have reviewed the patients History and Physical, chart, labs and discussed the procedure including the risks, benefits and alternatives for the proposed anesthesia with the patient or authorized representative who has indicated his/her understanding and acceptance.     Dental advisory given  Plan Discussed with: CRNA and Surgeon  Anesthesia Plan Comments: (Discussed risks of anesthesia with patient, including possibility of difficulty with spontaneous ventilation under anesthesia necessitating airway intervention, PONV, and rare risks such as cardiac or respiratory or neurological events, and allergic reactions. Discussed the role of CRNA in patient's perioperative care. Patient understands.)        Anesthesia Quick Evaluation

## 2024-01-03 NOTE — Anesthesia Postprocedure Evaluation (Signed)
 Anesthesia Post Note  Patient: [name]  Procedure(s) Performed: TRANSESOPHAGEAL ECHOCARDIOGRAM     Patient location during evaluation: Cath Lab Anesthesia Type: MAC Level of consciousness: awake and alert Pain management: pain level controlled Vital Signs Assessment: post-procedure vital signs reviewed and stable Respiratory status: spontaneous breathing, nonlabored ventilation, respiratory function stable and patient connected to nasal cannula oxygen Cardiovascular status: stable and blood pressure returned to baseline Postop Assessment: no apparent nausea or vomiting Anesthetic complications: no   There were no known notable events for this encounter.  Last Vitals:  Vitals:   01/03/24 1200 01/03/24 1215  BP: 134/62 137/63  Pulse: (!) 54 (!) 53  Resp: 15 19  Temp:    SpO2: 95% 93%    Last Pain:  Vitals:   01/03/24 1215  TempSrc:   PainSc: 0-No pain                 Rome Ade

## 2024-01-03 NOTE — Transfer of Care (Signed)
 Immediate Anesthesia Transfer of Care Note  Patient: [name]  Procedure(s) Performed: TRANSESOPHAGEAL ECHOCARDIOGRAM  Patient Location: PACU  Anesthesia Type:MAC  Level of Consciousness: awake, alert , oriented, and patient cooperative  Airway & Oxygen Therapy: Patient Spontanous Breathing  Post-op Assessment: Report given to RN, Post -op Vital signs reviewed and stable, Patient moving all extremities X 4, and Patient able to stick tongue midline  Post vital signs: Reviewed and stable  Last Vitals:  Vitals Value Taken Time  BP 169/77 01/03/24 11:49  Temp 97.3   Pulse 58 01/03/24 11:50  Resp 23 01/03/24 11:50  SpO2 96 % 01/03/24 11:50  Vitals shown include unfiled device data.  Last Pain:  Vitals:   01/03/24 1149  TempSrc:   PainSc: 0-No pain         Complications: There were no known notable events for this encounter.

## 2024-01-03 NOTE — Progress Notes (Signed)
  Echocardiogram Echocardiogram Transesophageal has been performed.  Devora Ellouise SAUNDERS 01/03/2024, 12:07 PM

## 2024-01-03 NOTE — Interval H&P Note (Signed)
 History and Physical Interval Note:  01/03/2024 11:03 AM  [name]  has presented today for surgery, with the diagnosis of AFIB POSTWATCHMAN EVAL.  The various methods of treatment have been discussed with the patient and family. After consideration of risks, benefits and other options for treatment, the patient has consented to  Procedure(s): TRANSESOPHAGEAL ECHOCARDIOGRAM (N/A) as a surgical intervention.  The patient's history has been reviewed, patient examined, no change in status, stable for surgery.  I have reviewed the patient's chart and labs.  Questions were answered to the patient's satisfaction.     Lonni LITTIE Nanas

## 2024-01-07 ENCOUNTER — Ambulatory Visit: Payer: Self-pay | Admitting: Cardiology

## 2024-02-02 ENCOUNTER — Ambulatory Visit: Attending: Cardiology | Admitting: Cardiology

## 2024-02-02 ENCOUNTER — Encounter: Payer: Self-pay | Admitting: Cardiology

## 2024-02-02 VITALS — BP 134/72 | HR 54 | Ht 61.0 in | Wt 148.2 lb

## 2024-02-02 DIAGNOSIS — R072 Precordial pain: Secondary | ICD-10-CM | POA: Diagnosis not present

## 2024-02-02 DIAGNOSIS — I342 Nonrheumatic mitral (valve) stenosis: Secondary | ICD-10-CM

## 2024-02-02 DIAGNOSIS — I10 Essential (primary) hypertension: Secondary | ICD-10-CM | POA: Diagnosis not present

## 2024-02-02 DIAGNOSIS — I480 Paroxysmal atrial fibrillation: Secondary | ICD-10-CM

## 2024-02-02 DIAGNOSIS — R001 Bradycardia, unspecified: Secondary | ICD-10-CM

## 2024-02-02 DIAGNOSIS — Z95818 Presence of other cardiac implants and grafts: Secondary | ICD-10-CM

## 2024-02-02 DIAGNOSIS — I340 Nonrheumatic mitral (valve) insufficiency: Secondary | ICD-10-CM

## 2024-02-02 MED ORDER — METOPROLOL TARTRATE 25 MG PO TABS: 12.5000 mg | ORAL_TABLET | Freq: Two times a day (BID) | ORAL | 3 refills | Status: DC

## 2024-02-02 MED ORDER — AMLODIPINE BESYLATE 2.5 MG PO TABS: 2.5000 mg | ORAL_TABLET | Freq: Every day | ORAL | 3 refills | Status: DC

## 2024-02-02 NOTE — Patient Instructions (Addendum)
 Medication Instructions:  Your physician has recommended you make the following change in your medication:   START Amlodipine  2.5 mg once daily DECREASE Metoprolol  to 12.5 mg twice daily    *If you need a refill on your cardiac medications before your next appointment, please call your pharmacy*  Lab Work: None  If you have labs (blood work) drawn today and your tests are completely normal, you will receive your results only by: MyChart Message (if you have MyChart) OR A paper copy in the mail If you have any lab test that is abnormal or we need to change your treatment, we will call you to review the results.  Testing/Procedures: [facility] MYOVIEW   Your Provider has ordered a Stress Test with nuclear imaging. The purpose of this test is to evaluate the blood supply to your heart muscle. This procedure is referred to as a Non-Invasive Stress Test. This is because other than having an IV started in your vein, nothing is inserted or invades your body. Cardiac stress tests are done to find areas of poor blood flow to the heart by determining the extent of coronary artery disease (CAD). Some patients exercise on a treadmill, which naturally increases the blood flow to your heart, while others who are unable to walk on a treadmill due to physical limitations have a pharmacologic/chemical stress agent called Lexiscan . This medicine will mimic walking on a treadmill by temporarily increasing your coronary blood flow.     REPORT TO [facility] MEDICAL MALL ENTRANCE  **Proceed to the 1st desk on the right, REGISTRATION, to check in**  Please note: this test may take anywhere between 2-4 hours to complete    Instructions regarding medication:   _XX__:   You may take all of your regular morning medications the day of your test unless listed below.   _XX___:  Hold other medications as follows: Furosemide  and Potassium that morning.     How to prepare for your Myoview  test:  Do not eat or drink for  6 hours prior to the test No caffeine for 24 hours prior to the test No smoking 24 hours prior to the test. Ladies, please do not wear dresses.  Skirts or pants are appropriate. Please wear a short sleeve shirt. No perfume, cologne or lotion. Wear comfortable walking shoes. No heels!   PLEASE NOTIFY THE OFFICE AT LEAST 24 HOURS IN ADVANCE IF YOU ARE UNABLE TO KEEP YOUR APPOINTMENT.  [phone] AND  PLEASE NOTIFY NUCLEAR MEDICINE AT [facility] AT LEAST 24 HOURS IN ADVANCE IF YOU ARE UNABLE TO KEEP YOUR APPOINTMENT. [phone]    Follow-Up: At [facility] you and your health needs are our priority.  As part of our continuing mission to provide you with exceptional heart care, our providers are all part of one team.  This team includes your primary Cardiologist (physician) and Advanced Practice Providers or APPs (Physician Assistants and Nurse Practitioners) who all work together to provide you with the care you need, when you need it.  Your next appointment:   6 week(s)  Provider:   Redell Cave, MD or Tylene Lunch, NP

## 2024-02-02 NOTE — Progress Notes (Addendum)
 Cardiology Office Note   Date:  02/02/2024  ID:  Erin Virginia  [name], [name] Feb 19, 1946, MRN [id number] PCP: [facility]  [facility] Providers Cardiologist:  Redell Cave, MD Electrophysiologist:  [name], MD  Electrophysiology APP:  Riddle, Suzann, NP     History of Present Illness Erin Virginia  [name] is a 78 y.o. 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   Admitted to the hospital 11/24 status post mechanical fall.  She slipped fell sustaining sacral fracture and scalp hematoma.  She had a history of multiple falls.  Was previously on apixaban  that was stopped after a fall.  Also has been anemic.  Managed by oncology with iron  infusions.  Apixaban  2.5 mg twice daily was started again 2 days prior.  Patient has not had any bleeding issues so far.  Echocardiogram revealed LVEF 55 to 60%.  She was seen in clinic 09/20/23 by Dr.Agbor-Etang.  She states she was having palpitations with heart rates up to 140 bpm.  She is continued on amiodarone  100 mg daily and recently had a thyroid removed due to concerns for thyroid cancer.  She was started on metoprolol  25 mg twice daily, amiodarone  was increased to 200 mg daily.  She was continued on apixaban  2.5 mg twice daily.  If there was no significant bleeding the plan was to increase apixaban  to 5 mg twice daily.  She was referred to EP for additional input questionable watchman candidate due to frequent falls and anemia.  Evaluated by EP Dr. Kitty 09/26/23.  They discussed the procedural risk for watchman implant and stated she would be appropriate for the procedure and would not need long-term anticoagulation which laminated the side effects and major bleeding risk.  She was seen in clinic 10/31/2023 by  Dr. Cave. She has been continued on her metoprolol , amiodarone , and apixaban  had been increased to 5 mg twice daily by the EP team.  She was tolerating apixaban  with no bleeding issues.  Watchman was planned for 2 days.  There were no other medication changes that were made or further testing that was ordered.  She was admitted on 5/29 - 11/03/2023 and underwent intraprocedural TEE which showed no LAA thrombus, left atrial appendage occlusive device placement with successful implantation and no early apparent complications.  Post implant anticoagulation strategy was to continue apixaban  5 mg twice daily for 45 days then transition to DAPT with aspirin  81 mg daily and clopidogrel 75 mg daily for 6 months.  She will require dental SBE for 6 months postop and should refrain from dental work or cleanings for the first 45 days post implant.  SBE is not necessary as the patient is in dentulous.  A repeat TEE due to contrast allergy will be performed in approximately 60 days to ensure proper seal of the device.   She was last seen in clinic 12/22/2023 by GORMAN Needle, NP from EP.  Due to her contrast allergy she was planned for TEE to evaluate placement of her Watchman device versus CTA.  On follow-up she was concerned about her medication.  Her PCP had made several medication changes patient was unsure if they were appropriate.  She brings her pill packs that had Eliquis , Plavix, and aspirin  stickers.  According to PCP's office apixaban  had been stopped on 7/14.  She was continued on 75 mg  of clopidogrel and 81 mg of aspirin  for total of 6 months and on 11/30.  They continue with aspirin  81 mg daily.  She was sent for follow-up labs and scheduled for TEE.  TEE revealed watchman with no peridevice leak or device related thrombus.  Severe mitral annular calcifications with moderate MR and moderate mitral stenosis.  She returns clinic today with various complaints.  She states that she has been experiencing left-sided  shoulder pain that spreads across her back to the right.  Worsening fatigue.  She feels as though when she turns her head to the left she has a stabbing pain into her left neck.  She also states that she has been having chest discomfort on occasion typically with exertion.  She states that she has fatigue that when she gets out of the shower that she has to sit down and rest and it takes her 10 to 15 minutes to recover.  She also notes that she has becoming hypoxic and typically sleeps with 2 L of oxygen at night but has been having to use her oxygen during the day when her oxygen saturations have been dropping.  She has had majority of her symptoms have started over the last week but does some started a month prior.  She states that she has been compliant with her current medication regimen.  Denies any hospitalizations or visits to the emergency department since her recent TEE.  ROS: 10 point review of systems has been reviewed and considered negative except ones been listed in HPI  Studies Reviewed EKG Interpretation Date/Time:  Friday February 02 2024 14:54:16 EDT Ventricular Rate:  54 PR Interval:  214 QRS Duration:  90 QT Interval:  552 QTC Calculation: 523 R Axis:   -35  Text Interpretation: Sinus bradycardia with 1st degree A-V block Left axis deviation Possible Anterior infarct , age undetermined Prolonged QT When compared with ECG of 22-Dec-2023 10:33, No significant change was found Confirmed by Gerard Frederick ([id number]) on 02/02/2024 2:59:17 PM    TEE 01/03/2024  1. 24mm Watchman FLX Device present. No peridevice leak or device related  thrombus seen   2. Left ventricular ejection fraction, by estimation, is 60 to 65%. The  left ventricle has normal function.   3. Right ventricular systolic function is normal. The right ventricular  size is normal.   4. Small left to right shunt present status post transseptal puncture.  Evidence of atrial level shunting detected by color flow Doppler.    5. A small pericardial effusion is present.   6. The mitral valve is degenerative. Moderate mitral valve regurgitation.  Moderate mitral stenosis. The mean mitral valve gradient is 6.0 mmHg with  average heart rate of 43 bpm. Severe mitral annular calcification.   7. The aortic valve is tricuspid. Aortic valve regurgitation is mild.   8. 3D performed of the mitral valve and Watchman device.   Cardiac CT, 10/26/2023 1. LAA landing zone measures 22mm x 21mm in diameter with depth 14mm, suitable for placement of a 27mm FLX device  2. There is no thrombus in the left atrial appendage.  3. Coronary calcium  score 779 (88th percentile)  4. Severe mitral annular calcification  Echo 04/2023:   1. Left ventricular ejection fraction, by estimation, is 55 to 60%. The  left ventricle has normal function. The left ventricle has no regional  wall motion abnormalities. Left ventricular diastolic parameters are  consistent with Grade I diastolic  dysfunction (impaired relaxation).   2. Right ventricular systolic  function is normal. The right ventricular  size is normal. There is normal pulmonary artery systolic pressure.   3. Left atrial size was mildly dilated.   4. The mitral valve is normal in structure. Mild mitral valve  regurgitation. Mild to moderate mitral stenosis. The mean mitral valve  gradient is 6.0 mmHg. Severe mitral annular calcification.   5. The aortic valve is normal in structure. Aortic valve regurgitation is  trivial. No aortic stenosis is present.   6. The inferior vena cava is normal in size with greater than 50%  respiratory variability, suggesting right atrial pressure of 3 mmHg.    Nuclear Stress 05/24/22:    The study is normal. The study is low risk.   No ST deviation was noted.   LV perfusion is normal. There is no evidence of ischemia. There is no evidence of infarction.   Left ventricular function is normal. Nuclear stress EF: 82 %. The left ventricular ejection  fraction is hyperdynamic (>65%).   1.  Normal left ventricular function 2.  No evidence for scar or ischemia 3.  Low risk study Risk Assessment/Calculations  CHA2DS2-VASc Score = 5   This indicates a 7.2% annual risk of stroke. The patient's score is based upon: CHF History: 0 HTN History: 1 Diabetes History: 1 Stroke History: 0 Vascular Disease History: 0 Age Score: 2 Gender Score: 1            Physical Exam VS:  BP 134/72   Pulse (!) 54   Ht 5' 1 (1.549 m)   Wt 148 lb 3.2 oz (67.2 kg)   SpO2 94%   BMI 28.00 kg/m        Wt Readings from Last 3 Encounters:  02/02/24 148 lb 3.2 oz (67.2 kg)  01/03/24 149 lb (67.6 kg)  11/29/23 148 lb (67.1 kg)    GEN: Well nourished, well developed in no acute distress NECK: No JVD; No carotid bruits CARDIAC: RRR, no murmurs, rubs, gallops RESPIRATORY:  Clear to auscultation without rales, wheezing or rhonchi  ABDOMEN: Soft, non-tender, non-distended EXTREMITIES: Trace pretibial edema edema; No deformity   ASSESSMENT AND PLAN Atypical chest pain within the left shoulder, of the left neck, spreads across to the right shoulder, with shortness of breath and worsening fatigue.  Metoprolol  is being decreased due to sinus bradycardia and fatigue.  She is also being scheduled for a Lexiscan  Myoview  to rule out ischemic causes of her discomfort.  She has been started on amlodipine  2.5 mg daily for antianginal effect.  EKG today revealed sinus bradycardia with rate 54 with prescriptively, left axis deviation, and prolonged QT.  No significant changes noted from prior studies.  Sinus bradycardia first-degree AV block on EKG today with a rate of 54 with prolonged QT and a left axis deviation.  With complaints of fatigue and today metoprolol  tartrate has been decreased to 25 mg twice daily to 12.5 mg twice daily.  Will continue to monitor with surveillance studies.  Paroxysmal atrial fibrillation status post LAAO closure with Watchman with recent  TEE revealing no PERI device leak or device related thrombus, severe mitral annular calcifications moderate MR and moderate MS. sinus bradycardia noted on EKG with prolonged/stable QT C.  She is continued on amiodarone  200 mg daily, metoprolol  tartrate was decreased from 25 mg twice daily to 12.5 mg twice daily.  She is also continue aspirin  81 mg daily and clopidogrel 75 mg daily for total 6 months with the end date being on 11/30 and then  she will continue with aspirin  81 mg daily indefinitely.  We have reached out to EP clinic today with the patient's symptoms to inquire if any of her symptoms could be related to recent LAD with Watchman procedure.  Advised by EP the symptoms would not be related to her recent procedure.  Recommend continuing follow-up with EP for continued management of A-fib.  Hypertension with a blood pressure today 134/72.  Blood pressure has remained stable.  Patient has been continued on furosemide  40 mg daily metoprolol  tartrate that has been decreased and starting amlodipine  2.5 mg daily for antianginal effect.  She has been encouraged to continue to monitor pressures 1 to 2 hours postmedication administration as well.  Moderate mitral stenosis and mitral regurgitation noted on recent TEE.  Patient scheduled for Lexiscan  Myoview  to rule out ischemic causes of symptoms. If testing is considered low risk can consider doing a cardiac MRI or R/LHC  Will also continue with surveillance echocardiograms (every 6 months if symptomatic). Will also reach out to primary cardiologist to see if she is a candidate for valve intervention.     Informed Consent   Shared Decision Making/Informed Consent The risks [chest pain, shortness of breath, cardiac arrhythmias, dizziness, blood pressure fluctuations, myocardial infarction, stroke/transient ischemic attack, nausea, vomiting, allergic reaction, radiation exposure, metallic taste sensation and life-threatening complications (estimated to be 1 in  10,000)], benefits (risk stratification, diagnosing coronary artery disease, treatment guidance) and alternatives of a nuclear stress test were discussed in detail with Ms. Lorson and she agrees to proceed.     Dispo: Patient to return to clinic to see MD/APP once testing is completed or sooner if needed.   Signed, Ambriella Kitt, NP

## 2024-02-12 ENCOUNTER — Encounter
Admission: RE | Admit: 2024-02-12 | Discharge: 2024-02-12 | Disposition: A | Discharge status: Home / Self Care | Source: Ambulatory Visit | Attending: Cardiology | Admitting: Cardiology

## 2024-02-12 ENCOUNTER — Other Ambulatory Visit: Payer: Self-pay | Admitting: Physician Assistant

## 2024-02-12 DIAGNOSIS — R072 Precordial pain: Secondary | ICD-10-CM | POA: Insufficient documentation

## 2024-02-12 LAB — NM MYOCAR MULTI W/SPECT W/WALL MOTION / EF
LV dias vol: 80 mL (ref 46–106)
LV sys vol: 37 mL (ref 3.8–5.2)
MPHR: 141 {beats}/min
Nuc Stress EF: 54 %
Peak HR: 76 {beats}/min
Percent HR: 53 %
Rest HR: 53 {beats}/min
Rest Nuclear Isotope Dose: 10.7 mCi
SDS: 0
SRS: 6
SSS: 1
ST Depression (mm): 0 mm
Stress Nuclear Isotope Dose: 31.1 mCi
TID: 1.15

## 2024-02-12 MED ORDER — TECHNETIUM TC 99M TETROFOSMIN IV KIT
10.0000 | PACK | Freq: Once | INTRAVENOUS | Status: AC | PRN
  Administered 2024-02-12: 10.68 via INTRAVENOUS

## 2024-02-12 MED ORDER — REGADENOSON 0.4 MG/5ML IV SOLN
0.4000 mg | Freq: Once | INTRAVENOUS | Status: AC
  Administered 2024-02-12: 0.4 mg via INTRAVENOUS

## 2024-02-12 MED ORDER — TECHNETIUM TC 99M TETROFOSMIN IV KIT
31.0500 | PACK | Freq: Once | INTRAVENOUS | Status: AC | PRN
Start: 2024-02-12 — End: 2024-02-12
  Administered 2024-02-12: 31.05 via INTRAVENOUS

## 2024-02-12 NOTE — Progress Notes (Signed)
     Erin Virginia  [name] presented for a nuclear stress test today.  I Lesley LITTIE Maffucci, PA-C, provided direct supervision and was present during the stress portion of the study today, which was completed without significant symptoms, immediate complications, or acute ST/T changes on ECG.  Stress imaging is pending at this time.  Preliminary ECG findings may be listed in the chart, but the stress test result will not be finalized until perfusion imaging is complete.  Lesley LITTIE Maffucci, PA-C  02/12/2024, 11:33 AM

## 2024-02-13 ENCOUNTER — Ambulatory Visit: Payer: Self-pay | Admitting: Cardiology

## 2024-02-13 NOTE — Progress Notes (Signed)
 Stress testing was considered low risk.  There was no evidence of ischemia there was an increased amount of artifact.  Overall reassuring study.

## 2024-03-06 NOTE — Addendum Note (Signed)
 Encounter addended by: Gladis Burnard SAILOR on: 03/06/2024 11:05 AM  Actions taken: Imaging Exam ended

## 2024-03-07 ENCOUNTER — Ambulatory Visit: Admit: 2024-03-07 | Discharge: 2024-03-12 | Payer: MEDICARE

## 2024-03-07 DIAGNOSIS — K5902 Outlet dysfunction constipation: Principal | ICD-10-CM

## 2024-03-07 MED ORDER — sennaSENOKOT86mgtablet
8.6 | ORAL_TABLET | ORAL | 0 refills | 30.00000 days | Status: AC
Start: 2024-03-07 — End: ?

## 2024-03-07 NOTE — Patient Instructions (Signed)
 Continue taking Metamucil daily    Start taking senna every other day

## 2024-03-07 NOTE — Progress Notes (Signed)
 [facility]   Return Office Visit  Nurse Practitioner    CHIEF COMPLAINT:   Alternating C/D, Bloating    HISTORY OF PRESENT ILLNESS:    [name] is a 78 y.o. female who is seen in follow up.    History of Present Illness  Patient was last seen on 02/09/2023 for s/p colonoscopy, Alternating C/D, Bloating.      [name] is a 78 year old female who presents with ongoing gastrointestinal issues, including incomplete bowel movements and abdominal discomfort. She completed pelvic floor therapy and feels like it helps a little.    She experiences a persistent feeling of incomplete bowel movements despite regular attempts to relieve herself. Her abdomen feels hard, and she notes a change from her previously flat stomach. She rarely experiences diarrhea but often feels that she cannot fully evacuate her bowels.    She has undergone pelvic floor therapy, which included exercises and techniques to aid bowel movements. She found the therapy helpful, particularly the exercises and breathing techniques, but still experiences symptoms.    She currently takes Metamucil daily and has recently started a probiotic given by her daughter, which she feels provides some relief. She does not take Miralax .    Denies any dysphagia, odynophagia, reflux, regurgitation, nausea. Denies any prior liver, pancreatic or gallbladder disease.      No history of anemia.  No sleep apnea. No narcotics. No history of anesthesia complications.    Anomanometry: 05/18/2023  Dyssynergic defecation    KUB: 06/08/2023  Moderate stool burden     Last Colonoscopy: 01/30/2023  Impression:    - Two 1 to 2 mm polyps in the transverse colon,                          removed with a cold biopsy forceps. Resected and                          retrieved.                          - The entire examined colon is normal. Biopsied.   Path:  A.   Colon, random, biopsy:   - Fragments of colonic mucosa with small lymphoid aggregates, lamina   propria     with pigmented macrophages and mild increase chronic inflammatory cells.   - No overt evidence of microscopic colitis.     B.   Colon, transverse, polyps x2:   - Fragment of tubular adenoma x1.   - Additional fragment of benign colonic mucosa with small lymphoid   aggregate x1.   - No evidence of high-grade dysplasia.     Colonoscopy: 01/01/2002  Normal. Dr. Sandralee at [facility]. Next recommended 5 years    Family History of Gastrointestinal Malignancy: none       REVIEW OF SYSTEMS:   (For the following ROS, pertinent positives are in bold; pertinent negatives are in italics.)  CONSTITUTIONAL: fevers, chills, sweats, weight loss, fatigue  CV: chest pain, dyspnea, palpitations  RESP: cough, dyspnea  GI: see HPI    Past Medical History:   Past Medical History:   Diagnosis Date    Acquired absence of ovary, unilateral     Missing left ovary.    Carpal tunnel syndrome on left 03/18/2016    Added automatically from request for surgery [id number]    Closed fracture of upper  end of tibia 12/18/2009    Congenital absence of one kidney     No left kidney.    History of fall     fell 10/2015    Low back pain     Osteoarthritis     Osteoporosis     Urticaria     Varicella        Past Surgical History:   Past Surgical History:   Procedure Laterality Date    ARTHROSCOPY SHOULDER Right 02/17/2022    Procedure: Right Shoulder Arthroscopic Balloon Arthroplasty;  Surgeon: Redell Age, MD;  Location: [facility];  Service: Orthopedics;  Laterality: Right;    CESAREAN SECTION  1985    COLONOSCOPY N/A 01/30/2023    Procedure: COLONOSCOPY W/ BIOPSY;  Surgeon: Sheree JULIANNA Booker, MD;  Location: [facility] ENDOSCOPY;  Service: Gastroenterology;  Laterality: N/A;    HARDWARE REMOVAL  8/09    of left tibia plateau    LEG SURGERY  05/09/06    fractured left tibia plateau    RELEASE CARPAL TUNNEL Left 03/22/2016    Procedure: LEFT CARPAL TUNNEL RELEASE;  Surgeon: Maude Levels, MD;  Location: [facility];  Service: Orthopedics;  Laterality: Left;    TOTAL KNEE ARTHROPLASTY  Left 11/09/2021    Procedure: LEFT TOTAL KNEE ARTHROPLASTY;  Surgeon: Ricardo Red Daniels, MD;  Location: [facility] OR;  Service: Orthopedics;  Laterality: Left;    WRIST FRACTURE SURGERY Left 11/13/2015    Procedure: OPEN REDUCTION INTERNAL FIXATION LEFT DISTAL RADIUS FRACTURE;  Surgeon: Maude Levels, MD;  Location: [facility] OR;  Service: Orthopedics;  Laterality: Left;       Current Medications:    Meds Reviews and Updated     ALLERGIES:  Iodine, Iodine and iodide containing products, Iodinated contrast media, and Shellfish containing products    SOCIAL HISTORY:    Social History     Socioeconomic History    Marital status: Widowed     Spouse name: Not on file    Number of children: Not on file    Years of education: Not on file    Highest education level: Not on file   Occupational History    Not on file   Tobacco Use    Smoking status: Never    Smokeless tobacco: Never   Vaping Use    Vaping status: Never Used   Substance and Sexual Activity    Alcohol use: Yes     Alcohol/week: 4.0 standard drinks of alcohol     Types: 4 Glasses of wine per week     Comment: ocasionally    Drug use: No     Comment: 12-23-2009    Sexual activity: Not Currently     Partners: Male   Other Topics Concern    Caffeine Use Yes    Occupational Exposure No    Exercise Yes    Seat Belt Yes   Social History Narrative    Not on file     Social Drivers of Health     Financial Resource Strain: Not on file   Food Insecurity: No Food Insecurity (11/01/2022)    Yearly Questionnaire     Do you need any assistance with obtaining housing, meals, medication, transportation or medical equipment?: No     Assistance needed for:: Not on file   Transportation Needs: No Transportation Needs (11/01/2022)    Yearly Questionnaire     Do you need any assistance with obtaining housing, meals, medication, transportation or medical equipment?: No  Assistance needed for:: Not on file   Physical Activity: Not on file   Stress: Not on file   Social Connections: Not on file    Intimate Partner Violence: Not At Risk (01/30/2023)    Humiliation, Afraid, Rape, and Kick questionnaire     Fear of Current or Ex-Partner: No     Emotionally Abused: No     Physically Abused: No     Sexually Abused: No   Housing Stability: Low Risk  (11/01/2022)    Yearly Questionnaire     Do you need any assistance with obtaining housing, meals, medication, transportation or medical equipment?: No     Assistance needed for:: Not on file        FAMILY HISTORY:   Family History   Problem Relation Age of Onset    Stroke Mother     Melanoma Neg Hx        PHYSICAL EXAM:   Vital Signs:  There were no vitals taken for this visit.  There is no height or weight on file to calculate BMI.    CONSTITUTIONAL: WD/WN, in NAD  LUNGS: no increased work of breathing   CARDIOVASCULAR: RRR  ABDOMEN: no scars, normal bowel sounds, soft, non-distended, non-tender, no rebound or guarding, no hepatomegaly   MUSCULOSKELETAL: there is no redness, warmth, or swelling of the joints  SKIN: no redness, warmth, or swelling, no rashes and no jaundice    Lab Results   Component Value Date    WBC 5.7 11/01/2022    HGB 15.1 11/01/2022    HCT 43.1 11/01/2022    MCV 97.0 11/01/2022    PLT 217 11/01/2022     Lab Results   Component Value Date    GLUCOSE 96 11/01/2022    BUN 15 11/01/2022    CREATININE 0.74 11/01/2022    K 4.7 11/01/2022    PHOS 4.3 04/11/2022    ALBUMIN 4.5 11/01/2022    EGFR 83 11/01/2022     Lab Results   Component Value Date    ALKPHOS 83 11/01/2022    ALT 9 11/01/2022    AST 24 12/30/2009    AST 24 12/30/2009    BILITOT 0.4 11/01/2022    ALBUMIN 4.5 11/01/2022    PROT 6.9 11/01/2022    LABGLOB 2.5 12/30/2009    LABGLOB 2.5 12/30/2009     No results found for: IRON, TIBC, FERRITIN    DIAGNOSTIC STUDIES REVIEWED:    As Noted in the HPI     ASSESSMENT AND PLAN:  CADANCE RAUS is a 78 y.o. female who is seen in follow up.    Assessment & Plan  Chronic constipation  Chronic constipation with incomplete evacuation and abdominal  discomfort. Alternating constipation and diarrhea, currently more constipation. Incomplete evacuation may be due to weak colonic muscles, scoliosis potentially affecting pelvic floor and core muscles. Pain likely musculoskeletal.  - Start Senna every other day.  - Continue Metamucil daily.    - Consider stopping probiotic if bloating occurs, continue if relief noted.  - Follow-up in six months to assess treatment.     Return to Clinic: 6 months    This note was completely edited, written and reviewed by me and consists of information cut and pasted from the my most recent visit, my smart phrases and other Epic tools. I have personally reviewed all aspects of this note to at least include reviewing this patient's chart and problem list, updating the history, physical exam, lab and procedure results,  and assessment and plan as detailed above and below.  As such this visit note reflects my current evaluation and management for this patient.     Billing for this visit was based on time. I spent 33 minutes speaking with the patient, conducting an interview, performing an exam, educating the patient on my assessment and plan, and preparing to see the patient (eg, review of tests), ordering medications, tests, or procedures, documenting clinical information in the electronic or other health record, and providing care coordination  on the same day as the encounter.      This document was dictated using Conservation officer, historic buildings.  While every effort is made to proofread the document as it is being created, please excuse any transcription errors encountered in reading.    Ozell Piano, APRN  [facility]   [phone]

## 2024-03-12 ENCOUNTER — Encounter: Payer: Self-pay | Admitting: Pulmonary Disease

## 2024-03-12 ENCOUNTER — Ambulatory Visit: Payer: Medicare (Managed Care) | Admitting: Pulmonary Disease

## 2024-03-12 VITALS — BP 118/68 | HR 57 | Temp 97.1°F | Ht 61.0 in | Wt 148.0 lb

## 2024-03-12 DIAGNOSIS — R0602 Shortness of breath: Secondary | ICD-10-CM

## 2024-03-12 NOTE — Progress Notes (Signed)
 Synopsis: Referred in by [facility]*   Subjective:   PATIENT ID: Erin Virginia  [name] BILIS: female DOB: 02-May-1946, MRN: [id number]  Chief Complaint  Patient presents with   Pleural Effusion    No wheezing, SOB, or cough.  Using Advair BID which helps with her breathing. Does not have Albuterol  inhaler.    HPI [name] is a 78 year old female patient with a past medical history of paroxysmal A-fib on Eliquis , asthma, hypertension, hypothyroidism, history of DVT who is presenting today to the pulmonary office for follow-up on pleural effusion.  She presented initially to [facility] in November for shortness of breath, night sweats, unintentional weight loss and loss of appetite.  She was found to have a large right pleural effusion.  She underwent a thoracentesis on 11/3 which revealed an exudative effusion with lymphocyte predominance.  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 Flow cytometry was sent which was consistent with a CD10 positive monoclonal B-cell population.  this prompted a bone marrow biopsy on 11/20 which did not show any CD34 positive blastic population nor monoclonal B-cell population or abnormal T-cell.  Blood flow cytometry was also negative.  CT abdomen pelvis 11/8 nodular liver contour strongly suggestive of cirrhosis.  CT chest 11/8 moderate to large layering right pleural effusion.  No mediastinal lymphadenopathy.  No other suspicious lesions.  PET scan 11/13 -no specific findings for malignancy.  Redemonstrated liver cirrhosis and moderate right pleural effusion.  Echocardiogram 11/03 -LVEF 65 to 70%.  Normal RV function.  No significant valvular disease.  She has had 4 ultrasound guided thoracentesis so far. Seen by Dr. Babara and suspicion for pleural lymphoma.   S/p pleural biopsy and IPC placement on 05/29/2023. Path with prominent atypical lymphoid cells with B-cell population  predominance.   She was referred to [facility] for a second opinion and reportedly has mentioned that no plan for chemo and this be followed clinically.   Her IPC has not been draining much and it hasn't been accessed for 2 weeks. US  in office without any effusion.   Breathing has since improved. She is only using her advair as needed. Currently receiving Iron  transfusion at the cancer center.   Echocardiogram 11/03 - with normal EF 55 to 60%, Grade I diastolic dysfunction. Normal RV size and function and no signs of tricuspid regurgitation.   Family history -no family history of pulmonary disease.  Social history -never smoker, denies alcohol  use and denies any illicit drug use.  OV 03-12-2024 [name] is here to follow up regarding her right pleural effusion. US  in office without recurrence. She is doing well overall and has no respiratory issues. I believe pleurodesis or partial pleurodesis was achieved with the Pleurx.   ROS All systems were reviewed and are negative except for the above.  Objective:   Vitals:   03/12/24 1052  BP: 118/68  Pulse: (!) 57  Temp: (!) 97.1 F (36.2 C)  SpO2: 95%  Weight: 148 lb (67.1 kg)  Height: 5' 1 (1.549 m)   95% on RA BMI Readings from Last 3 Encounters:  03/12/24 27.96 kg/m  02/02/24 28.00 kg/m  01/03/24 28.15 kg/m   Wt Readings from Last 3 Encounters:  03/12/24 148 lb (67.1 kg)  02/02/24 148 lb 3.2 oz (67.2 kg)  01/03/24 149 lb (67.6 kg)    Physical Exam GEN: NAD, Healthy Appearing HEENT: Supple Neck, Reactive Pupils, EOMI  CVS: Normal S1, Normal S2, RRR, No murmurs or ES appreciated  Lungs: Bibasilar crackles noted.  Abdomen: Soft, non tender, non distended, + BS  Extremities: Warm and well perfused, No edema  Skin: No suspicious lesions appreciated  Psych: Normal Affect  US  thorax in clinic without recurrent effusion.   Labs and imaging were reviewed.  Ancillary Information   CBC    Component Value Date/Time   WBC  8.2 12/22/2023 1147   WBC 7.0 11/20/2023 1109   WBC 8.6 11/02/2023 0930   RBC 4.42 12/22/2023 1147   RBC 4.32 11/20/2023 1109   RBC 4.38 11/20/2023 1109   HGB 12.9 12/22/2023 1147   HCT 39.9 12/22/2023 1147   PLT 150 12/22/2023 1147   MCV 90 12/22/2023 1147   MCH 29.2 12/22/2023 1147   MCH 27.9 11/20/2023 1109   MCHC 32.3 12/22/2023 1147   MCHC 32.5 11/20/2023 1109   RDW 14.6 12/22/2023 1147   LYMPHSABS 1.4 11/20/2023 1109   MONOABS 0.4 11/20/2023 1109   EOSABS 0.1 11/20/2023 1109   BASOSABS 0.0 11/20/2023 1109       No data to display           Assessment & Plan:  [name] is a 78 year old female patient with a past medical history of paroxysmal A-fib on Eliquis , asthma, hypertension, hypothyroidism, history of DVT who is presenting today to the pulmonary office for follow-up on pleural effusion.  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  Fluid recurrence could be impacted by liver cirrhosis as well.  Ultrasound in office shows recurrence of right pleural effusion, anechoic noncomplex.  Ordered chest x-ray.    S/p pleural biopsy and IPC placement on 05/29/2023. Path with prominent atypical lymphoid cells with B-cell population predominance.   She was referred to [facility] for a second opinion and reportedly has mentioned that no plan for chemo and this be followed clinically.   Her IPC has not been draining much and it hasn't been accessed for 2 weeks. US  in office without any effusion. S/p IPC removal on 07/05/2023. US  in  office today without any recurrence.   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   Intermittent asthma. Currently only using albuterol  as needed and rarely.   RTC 12 monhts.   I spent 35 minutes caring for this patient today, including preparing to see the patient, obtaining a medical history , reviewing a separately obtained history, performing a medically appropriate examination and/or evaluation, counseling and educating the patient/family/caregiver, ordering medications, tests, or procedures, documenting clinical information in the electronic health record, and independently interpreting results (not separately reported/billed) and communicating results to the patient/family/caregiver  Darrin Barn, MD [facility] 03/12/2024 10:57 AM

## 2024-03-19 ENCOUNTER — Ambulatory Visit: Attending: Cardiology | Admitting: Cardiology

## 2024-03-19 ENCOUNTER — Encounter: Payer: Self-pay | Admitting: Cardiology

## 2024-03-19 VITALS — BP 120/50 | HR 51 | Ht 60.0 in | Wt 152.6 lb

## 2024-03-19 DIAGNOSIS — I480 Paroxysmal atrial fibrillation: Secondary | ICD-10-CM

## 2024-03-19 DIAGNOSIS — I10 Essential (primary) hypertension: Secondary | ICD-10-CM | POA: Diagnosis not present

## 2024-03-19 DIAGNOSIS — R42 Dizziness and giddiness: Secondary | ICD-10-CM | POA: Diagnosis not present

## 2024-03-19 DIAGNOSIS — R001 Bradycardia, unspecified: Secondary | ICD-10-CM

## 2024-03-19 DIAGNOSIS — I342 Nonrheumatic mitral (valve) stenosis: Secondary | ICD-10-CM

## 2024-03-19 DIAGNOSIS — R0789 Other chest pain: Secondary | ICD-10-CM

## 2024-03-19 DIAGNOSIS — I340 Nonrheumatic mitral (valve) insufficiency: Secondary | ICD-10-CM

## 2024-03-19 DIAGNOSIS — Z95818 Presence of other cardiac implants and grafts: Secondary | ICD-10-CM

## 2024-03-19 MED ORDER — METOPROLOL TARTRATE 25 MG PO TABS
12.5000 mg | ORAL_TABLET | Freq: Every day | ORAL | 3 refills | Status: AC
Start: 2024-03-19 — End: 2025-03-19

## 2024-03-19 NOTE — Progress Notes (Signed)
 Cardiology Office Note   Date:  03/19/2024  ID:  Velia Virginia  Briony, Parveen 01-Dec-1945, MRN [id number] PCP: [facility]  [facility] Providers Cardiologist:  Redell Cave, MD Electrophysiologist:  [name], MD  Electrophysiology APP:  Riddle, Suzann, NP     History of Present Illness Erin Virginia  [name] is a 78 y.o. 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   Admitted to the hospital 11/24 status post mechanical fall.  She slipped fell sustaining sacral fracture and scalp hematoma.  She had a history of multiple falls.  Was previously on apixaban  that was stopped after a fall.  Also has been anemic.  Managed by oncology with iron  infusions.  Apixaban  2.5 mg twice daily was started again 2 days prior.  Patient has not had any bleeding issues so far.  Echocardiogram revealed LVEF 55 to 60%.  She was seen in clinic 09/20/23 by Dr.Agbor-Etang.  She states she was having palpitations with heart rates up to 140 bpm.  She is continued on amiodarone  100 mg daily and recently had a thyroid removed due to concerns for thyroid cancer.  She was started on metoprolol  25 mg twice daily, amiodarone  was increased to 200 mg daily.  She was continued on apixaban  2.5 mg twice daily.  If there was no significant bleeding the plan was to increase apixaban  to 5 mg twice daily.  She was referred to EP for additional input questionable watchman candidate due to frequent falls and anemia.  Evaluated by EP Dr. Kitty 09/26/23.  They discussed the procedural risk for watchman implant and stated she would be appropriate for the procedure and would not need long-term anticoagulation which laminated the side effects and major bleeding risk.  She was seen in clinic  10/31/2023 by Dr. Cave. She has been continued on her metoprolol , amiodarone , and apixaban  had been increased to 5 mg twice daily by the EP team.  She was tolerating apixaban  with no bleeding issues.  Watchman was planned for 2 days.  There were no other medication changes that were made or further testing that was ordered.  She was admitted on 5/29 - 11/03/2023 and underwent intraprocedural TEE which showed no LAA thrombus, left atrial appendage occlusive device placement with successful implantation and no early apparent complications.  Post implant anticoagulation strategy was to continue apixaban  5 mg twice daily for 45 days then transition to DAPT with aspirin  81 mg daily and clopidogrel 75 mg daily for 6 months.  She will require dental SBE for 6 months postop and should refrain from dental work or cleanings for the first 45 days post implant.  SBE is not necessary as the patient is in dentulous.  A repeat TEE due to contrast allergy will be performed in approximately 60 days to ensure proper seal of the device.  She previously been seen in clinic 12/22/2023 by instrumental NP from EP.  Due to her contrast allergy she was planned for TEE to evaluate placement of Watchman device versus CTA.  On follow-up she was concerned about her medication.  PCP made several medication changes and patient was unsure if they were appropriate.  According to PCPs office apixaban  had been stopped on 7/14.  She was continued on clopidogrel 75 mg daily and aspirin  81 mg daily for total of 6 months.  She  was sent for follow-up labs and schedule for TEE.  TEE evaluated Watchman with no peridevice leak or device related thrombus.  Severe mitral annular calcifications with moderate MR and moderate mitral stenosis were noted.   She was last seen in clinic 02/02/2020 with various complaints.  She been experiencing left-sided shoulder pain that spreads across her body to the right with worsening fatigue.  States that her fatigue  was when she gets out of the shower when she was resting 10 to 15 minutes to recover.  She also noted that she had been, hypoxic with having to use her oxygen throughout the day.  She was scheduled for stress testing to rule out any ischemic causes for her changes.  She returns to clinic today accompanied by family member.  She has several complaints today and concerns.  She states that she continues to have peripheral edema to her bilateral lower extremities.  She states that her dizziness has worsened and she has been having to sit down and rest for approximately an hour after she takes her shower.  She continues to have a discomfort in her arm.  She has also noted as of late that her heart rate has been dropping into the 40s and she has been having more fatigue.  She states she has also noted that when she turns her neck to the left that she has worsening dizziness and feels a popping sensation and has concerns of issues with her carotid artery that may be making her dizziness exacerbated.  She states that she has been compliant with her current medication regimen.  Denies any hospitalizations or visits to the emergency department.  ROS: 10 point review of system has been reviewed and considered negative except ones been listed in HPI  Studies Reviewed EKG Interpretation Date/Time:  Tuesday March 19 2024 09:26:05 EDT Ventricular Rate:  51 PR Interval:  200 QRS Duration:  78 QT Interval:  544 QTC Calculation: 501 R Axis:   -35  Text Interpretation: Sinus bradycardia Left axis deviation Anterior infarct (cited on or before 02-Feb-2024) When compared with ECG of 02-Feb-2024 14:54, No significant change was found Confirmed by Gerard Frederick ([id number]) on 03/19/2024 9:31:30 AM    TEE 01/03/2024  1. 24mm Watchman FLX Device present. No peridevice leak or device related  thrombus seen   2. Left ventricular ejection fraction, by estimation, is 60 to 65%. The  left ventricle has normal function.   3.  Right ventricular systolic function is normal. The right ventricular  size is normal.   4. Small left to right shunt present status post transseptal puncture.  Evidence of atrial level shunting detected by color flow Doppler.   5. A small pericardial effusion is present.   6. The mitral valve is degenerative. Moderate mitral valve regurgitation.  Moderate mitral stenosis. The mean mitral valve gradient is 6.0 mmHg with  average heart rate of 43 bpm. Severe mitral annular calcification.   7. The aortic valve is tricuspid. Aortic valve regurgitation is mild.   8. 3D performed of the mitral valve and Watchman device.    Cardiac CT, 10/26/2023 1. LAA landing zone measures 22mm x 21mm in diameter with depth 14mm, suitable for placement of a 27mm FLX device  2. There is no thrombus in the left atrial appendage.  3. Coronary calcium  score 779 (88th percentile)  4. Severe mitral annular calcification   Echo 04/2023:   1. Left ventricular ejection fraction, by estimation, is 55 to 60%. The  left ventricle  has normal function. The left ventricle has no regional  wall motion abnormalities. Left ventricular diastolic parameters are  consistent with Grade I diastolic  dysfunction (impaired relaxation).   2. Right ventricular systolic function is normal. The right ventricular  size is normal. There is normal pulmonary artery systolic pressure.   3. Left atrial size was mildly dilated.   4. The mitral valve is normal in structure. Mild mitral valve  regurgitation. Mild to moderate mitral stenosis. The mean mitral valve  gradient is 6.0 mmHg. Severe mitral annular calcification.   5. The aortic valve is normal in structure. Aortic valve regurgitation is  trivial. No aortic stenosis is present.   6. The inferior vena cava is normal in size with greater than 50%  respiratory variability, suggesting right atrial pressure of 3 mmHg.    Nuclear Stress 05/24/22:    The study is normal. The study is low  risk.   No ST deviation was noted.   LV perfusion is normal. There is no evidence of ischemia. There is no evidence of infarction.   Left ventricular function is normal. Nuclear stress EF: 82 %. The left ventricular ejection fraction is hyperdynamic (>65%).   1.  Normal left ventricular function 2.  No evidence for scar or ischemia 3.  Low risk study Risk Assessment/Calculations  CHA2DS2-VASc Score = 5   This indicates a 7.2% annual risk of stroke. The patient's score is based upon: CHF History: 0 HTN History: 1 Diabetes History: 1 Stroke History: 0 Vascular Disease History: 0 Age Score: 2 Gender Score: 1            Physical Exam VS:  BP (!) 120/50 (BP Location: Right Arm, Patient Position: Sitting, Cuff Size: Normal)   Pulse (!) 51   Ht 5' (1.524 m)   Wt 152 lb 9.6 oz (69.2 kg)   SpO2 95%   BMI 29.80 kg/m        Wt Readings from Last 3 Encounters:  03/19/24 152 lb 9.6 oz (69.2 kg)  03/12/24 148 lb (67.1 kg)  02/02/24 148 lb 3.2 oz (67.2 kg)    GEN: Well nourished, well developed in no acute distress NECK: No JVD; No carotid bruits CARDIAC: RRR, no murmurs, rubs, gallops RESPIRATORY:  Clear to auscultation without rales, wheezing or rhonchi  ABDOMEN: Soft, non-tender, non-distended EXTREMITIES:  No edema; No deformity   ASSESSMENT AND PLAN Paroxysmal atrial fibrillation status post LAAO closure with watchman and recent TEE revealing no PERI device leak or device related thrombus, severe mitral annular calcifications, moderate MR, moderate MS. she has been continued on amiodarone  200 mg daily, metoprolol  tartrate was decreased to 25 mg twice daily and 12.5 mg twice daily.  She has also been continued on clopidogrel 75 mg daily for a total of 6 months with a end date being 11/30 and continued on aspirin  81 mg daily indefinitely.  She is being sent for labs today of TSH, CMP, and CBC.  Hypertension with a blood pressure today 120/50.  Blood pressure continues to remain  controlled.  She has been continued on furosemide  40 mg daily, metoprolol  tartrate 12.5 mg twice daily with p.m. dose of twice daily held due to additional bradycardia, and amlodipine  2.5 mg daily.  She has been encouraged to continue to monitor pressure 1 to 2 hours postmedication administration as well.  Sinus bradycardia with ongoing bradycardia with heart rates and noticeably dropping into the 40s.  EKG today reveals sinus bradycardia with a rate of 51 with  a left axis deviation.  Her second dose of metoprolol  tartrate is being discontinued.  She is continued on amiodarone  200 mg daily if need be will reduce amiodarone  dosing further.  Will continue to monitor with surveillance studies.  Atypical chest pain which has improved.  She recently had a Lexiscan  Myoview  which considered a low risk study with no ischemic causes of her discomfort.  She has been continued on amlodipine  2.5 mg daily for antianginal effect.  Dizziness and lightheadedness which she has not been found to be orthostatic.  She has been scheduled for carotid duplex to ensure that she has no carotid stenosis that is worsening her dizziness.  Moderate mitral stenosis mitral regurgitation noted on recent TEE.  Recent Lexiscan  mild few was considered low risk scan.  Reached out to primary cardiologist who stated that structural team will likely not be able to do anything at this point related to the valve.  Recommend conservative management.       Dispo: Patient to return to clinic see MD/APP in 6 weeks or sooner if needed for further evaluation.  Signed, Marcile Fuquay, NP

## 2024-03-19 NOTE — Patient Instructions (Signed)
 Medication Instructions:  Your physician recommends the following medication changes.  STOP TAKING: PM dose of Metoprolol   *If you need a refill on your cardiac medications before your next appointment, please call your pharmacy*  Lab Work: Your provider would like for you to have following labs drawn today TSH, CMP, CBC.   If you have labs (blood work) drawn today and your tests are completely normal, you will receive your results only by: MyChart Message (if you have MyChart) OR A paper copy in the mail If you have any lab test that is abnormal or we need to change your treatment, we will call you to review the results.  Testing/Procedures: Your physician has requested that you have a carotid duplex. This test is an ultrasound of the carotid arteries in your neck. It looks at blood flow through these arteries that supply the brain with blood.   Allow one hour for this exam.  There are no restrictions or special instructions.  This will take place at [address] [facility] [address], [place] [address]  Please note: We ask at that you not bring children with you during ultrasound (echo/ vascular) testing. Due to room size and safety concerns, children are not allowed in the ultrasound rooms during exams. Our front office staff cannot provide observation of children in our lobby area while testing is being conducted. An adult accompanying a patient to their appointment will only be allowed in the ultrasound room at the discretion of the ultrasound technician under special circumstances. We apologize for any inconvenience.   Follow-Up: At [facility], you and your health needs are our priority.  As part of our continuing mission to provide you with exceptional heart care, our providers are all part of one team.  This team includes your primary Cardiologist (physician) and Advanced Practice Providers or APPs (Physician Assistants and Nurse Practitioners) who all work  together to provide you with the care you need, when you need it.  Your next appointment:   6 week(s)  Provider:   You may see Redell Cave, MD or one of the following Advanced Practice Providers on your designated Care Team:   Lonni Meager, NP Lesley Maffucci, PA-C Bernardino Bring, PA-C [place], PA-C Tylene Lunch, NP Barnie Hila, NP

## 2024-03-20 ENCOUNTER — Ambulatory Visit: Payer: Self-pay | Admitting: Cardiology

## 2024-03-20 LAB — COMPREHENSIVE METABOLIC PANEL WITH GFR
ALT: 13 IU/L (ref 0–32)
AST: 19 IU/L (ref 0–40)
Albumin: 4 g/dL (ref 3.8–4.8)
Alkaline Phosphatase: 44 IU/L — ABNORMAL LOW (ref 49–135)
BUN/Creatinine Ratio: 20 (ref 12–28)
BUN: 16 mg/dL (ref 8–27)
Bilirubin Total: 0.3 mg/dL (ref 0.0–1.2)
CO2: 22 mmol/L (ref 20–29)
Calcium: 8.7 mg/dL (ref 8.7–10.3)
Chloride: 99 mmol/L (ref 96–106)
Creatinine, Ser: 0.8 mg/dL (ref 0.57–1.00)
Globulin, Total: 2.7 g/dL (ref 1.5–4.5)
Glucose: 131 mg/dL — ABNORMAL HIGH (ref 70–99)
Potassium: 3.5 mmol/L (ref 3.5–5.2)
Sodium: 139 mmol/L (ref 134–144)
Total Protein: 6.7 g/dL (ref 6.0–8.5)
eGFR: 75 mL/min/1.73 (ref >59)

## 2024-03-20 LAB — CBC
Hematocrit: 41 % (ref 34.0–46.6)
Hemoglobin: 13.1 g/dL (ref 11.1–15.9)
MCH: 29.8 pg (ref 26.6–33.0)
MCHC: 32 g/dL (ref 31.5–35.7)
MCV: 93 fL (ref 79–97)
Platelets: 144 x10E3/uL — ABNORMAL LOW (ref 150–450)
RBC: 4.4 x10E6/uL (ref 3.77–5.28)
RDW: 13.7 % (ref 11.7–15.4)
WBC: 7.2 x10E3/uL (ref 3.4–10.8)

## 2024-03-20 LAB — TSH: TSH: 4.19 u[IU]/mL (ref 0.450–4.500)

## 2024-03-20 NOTE — Progress Notes (Signed)
 Labs are stable.  Thyroid continues to be stable.  Continue current medication with changes made yesterday at this time.  Will discuss decreasing amiodarone  at return appointment.

## 2024-04-02 ENCOUNTER — Ambulatory Visit (HOSPITAL_COMMUNITY)
Admission: RE | Admit: 2024-04-02 | Discharge: 2024-04-02 | Disposition: A | Discharge status: Home / Self Care | Source: Ambulatory Visit | Attending: Cardiology | Admitting: Cardiology

## 2024-04-02 DIAGNOSIS — R42 Dizziness and giddiness: Secondary | ICD-10-CM | POA: Insufficient documentation

## 2024-04-30 ENCOUNTER — Ambulatory Visit: Attending: Cardiology | Admitting: Cardiology

## 2024-04-30 ENCOUNTER — Encounter: Payer: Self-pay | Admitting: Cardiology

## 2024-04-30 VITALS — BP 138/60 | HR 63 | Ht 61.0 in | Wt 151.6 lb

## 2024-04-30 DIAGNOSIS — R0789 Other chest pain: Secondary | ICD-10-CM

## 2024-04-30 DIAGNOSIS — R42 Dizziness and giddiness: Secondary | ICD-10-CM

## 2024-04-30 DIAGNOSIS — I342 Nonrheumatic mitral (valve) stenosis: Secondary | ICD-10-CM

## 2024-04-30 DIAGNOSIS — I10 Essential (primary) hypertension: Secondary | ICD-10-CM

## 2024-04-30 DIAGNOSIS — Z95818 Presence of other cardiac implants and grafts: Secondary | ICD-10-CM

## 2024-04-30 DIAGNOSIS — I4510 Unspecified right bundle-branch block: Secondary | ICD-10-CM

## 2024-04-30 DIAGNOSIS — I480 Paroxysmal atrial fibrillation: Secondary | ICD-10-CM | POA: Diagnosis not present

## 2024-04-30 DIAGNOSIS — I340 Nonrheumatic mitral (valve) insufficiency: Secondary | ICD-10-CM

## 2024-04-30 MED ORDER — AMIODARONE HCL 100 MG PO TABS: 100.0000 mg | ORAL_TABLET | Freq: Every day | ORAL | 3 refills | Status: AC

## 2024-04-30 MED ORDER — AMLODIPINE BESYLATE 2.5 MG PO TABS: 2.5000 mg | ORAL_TABLET | Freq: Every day | ORAL | 3 refills | Status: AC

## 2024-04-30 NOTE — Progress Notes (Signed)
 Cardiology Office Note   Date:  04/30/2024  ID:  Erin Virginia  [name], [name] 03/26/1946, MRN [id number] PCP: [facility]  [facility] Providers Cardiologist:  Redell Cave, MD Cardiology APP:  Gerard Frederick, NP  Electrophysiologist:  [name], MD  Electrophysiology APP:  Riddle, Suzann, NP     History of Present Illness Erin Virginia  [name] is a 78 y.o. 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   Admitted to the hospital 11/24 status post mechanical fall.  She slipped fell sustaining sacral fracture and scalp hematoma.  She had a history of multiple falls.  Was previously on apixaban  that was stopped after a fall.  Also has been anemic.  Managed by oncology with iron  infusions.  Apixaban  2.5 mg twice daily was started again 2 days prior.  Patient has not had any bleeding issues so far.  Echocardiogram revealed LVEF 55 to 60%.  She was seen in clinic 09/20/23 by Dr.Agbor-Etang.  She states she was having palpitations with heart rates up to 140 bpm.  She is continued on amiodarone  100 mg daily and recently had a thyroid removed due to concerns for thyroid cancer.  She was started on metoprolol  25 mg twice daily, amiodarone  was increased to 200 mg daily.  She was continued on apixaban  2.5 mg twice daily.  If there was no significant bleeding the plan was to increase apixaban  to 5 mg twice daily.  She was referred to EP for additional input questionable watchman candidate due to frequent falls and anemia.  Evaluated by EP Dr. Kitty 09/26/23.  They discussed the procedural risk for watchman implant and stated she would be appropriate for the procedure and would not need long-term  anticoagulation which laminated the side effects and major bleeding risk.  She was seen in clinic 10/31/2023 by Dr. Cave. She has been continued on her metoprolol , amiodarone , and apixaban  had been increased to 5 mg twice daily by the EP team.  She was tolerating apixaban  with no bleeding issues.  Watchman was planned for 2 days.  There were no other medication changes that were made or further testing that was ordered.  She was admitted on 5/29 - 11/03/2023 and underwent intraprocedural TEE which showed no LAA thrombus, left atrial appendage occlusive device placement with successful implantation and no early apparent complications.  Post implant anticoagulation strategy was to continue apixaban  5 mg twice daily for 45 days then transition to DAPT with aspirin  81 mg daily and clopidogrel 75 mg daily for 6 months.  She will require dental SBE for 6 months postop and should refrain from dental work or cleanings for the first 45 days post implant.  SBE is not necessary as the patient is in dentulous.  A repeat TEE due to contrast allergy will be performed in approximately 60 days to ensure proper seal of the device.  She previously been seen in clinic 12/22/2023 by instrumental NP from EP.  Due to her contrast allergy she was planned for TEE to evaluate placement of Watchman device versus CTA.  On follow-up she was concerned about her medication.  PCP made several medication changes and patient was unsure if they were appropriate.  According to PCPs office apixaban  had been stopped on 7/14.  She was continued on clopidogrel 75  mg daily and aspirin  81 mg daily for total of 6 months.  She was sent for follow-up labs and schedule for TEE.  TEE evaluated Watchman with no peridevice leak or device related thrombus.  Severe mitral annular calcifications with moderate MR and moderate mitral stenosis were noted.  She was seen in clinic 02/02/2020 with various complaints.  She had been experiencing left-sided  shoulder pain and spread across body to the right with worsening fatigue.  She stated she had fatigue when she gets out of the shower and has to rest for 10 to 15 minutes.  She was also noted that she had been hypoxic and having to use her oxygen throughout the day.  She was scheduled for stress testing to rule out any ischemic causes of her changes.  She was last seen in clinic 03/19/2024 stating she had continued to have peripheral edema to her bilateral lower extremities, her dizziness had worsened and she would have to sit down to rest for approximately an hour after she takes a shower.  She continued to have discomfort in her arm.  She also noted that of late her heart rates been dropping into the 40s and she had been a bit more fatigued.  She was sent for updated labs and she was scheduled for an updated carotid duplex.   She returns to clinic today with continued occasional complaints of right-sided chest pain and weakness noted to the muscles on her right chest.  She denies any other associated symptoms.  She states she has been compliant with her current medication regimen.  She has a few concerns today as she stated that her face was getting ready to discontinue her aspirin  and she was advised that after her watchman she would have to stay on aspirin .  She denies any hospitalizations or visits to the emergency department   ROS: 10 point review of systems has been reviewed and considered negative the exception was been listed in the HPI  Studies Reviewed EKG Interpretation Date/Time:  Tuesday April 30 2024 08:43:51 EST Ventricular Rate:  63 PR Interval:  188 QRS Duration:  128 QT Interval:  502 QTC Calculation: 513 R Axis:   251  Text Interpretation: Normal sinus rhythm Right bundle branch block When compared with ECG of 19-Mar-2024 09:26, Right bundle branch block is now Present Confirmed by Gerard Frederick ([id number]) on 04/30/2024 8:48:11 AM    Carotid Duplex 04/02/2024 Summary:   Right Carotid: There is no evidence of stenosis in the right ICA. The ECA                 appears <50% stenosed.   Left Carotid: Velocities in the left ICA are consistent with a 1-39%  stenosis.               The ECA appears <50% stenosed.   Vertebrals:  Bilateral vertebral arteries demonstrate antegrade flow.  Subclavians: Normal flow hemodynamics were seen in bilateral subclavian               arteries.   TEE 01/03/2024  1. 24mm Watchman FLX Device present. No peridevice leak or device related  thrombus seen   2. Left ventricular ejection fraction, by estimation, is 60 to 65%. The  left ventricle has normal function.   3. Right ventricular systolic function is normal. The right ventricular  size is normal.   4. Small left to right shunt present status post transseptal puncture.  Evidence of atrial level shunting detected by color flow Doppler.  5. A small pericardial effusion is present.   6. The mitral valve is degenerative. Moderate mitral valve regurgitation.  Moderate mitral stenosis. The mean mitral valve gradient is 6.0 mmHg with  average heart rate of 43 bpm. Severe mitral annular calcification.   7. The aortic valve is tricuspid. Aortic valve regurgitation is mild.   8. 3D performed of the mitral valve and Watchman device.    Cardiac CT, 10/26/2023 1. LAA landing zone measures 22mm x 21mm in diameter with depth 14mm, suitable for placement of a 27mm FLX device  2. There is no thrombus in the left atrial appendage.  3. Coronary calcium  score 779 (88th percentile)  4. Severe mitral annular calcification   Echo 04/2023:   1. Left ventricular ejection fraction, by estimation, is 55 to 60%. The  left ventricle has normal function. The left ventricle has no regional  wall motion abnormalities. Left ventricular diastolic parameters are  consistent with Grade I diastolic  dysfunction (impaired relaxation).   2. Right ventricular systolic function is normal. The right  ventricular  size is normal. There is normal pulmonary artery systolic pressure.   3. Left atrial size was mildly dilated.   4. The mitral valve is normal in structure. Mild mitral valve  regurgitation. Mild to moderate mitral stenosis. The mean mitral valve  gradient is 6.0 mmHg. Severe mitral annular calcification.   5. The aortic valve is normal in structure. Aortic valve regurgitation is  trivial. No aortic stenosis is present.   6. The inferior vena cava is normal in size with greater than 50%  respiratory variability, suggesting right atrial pressure of 3 mmHg.    Nuclear Stress 05/24/22:    The study is normal. The study is low risk.   No ST deviation was noted.   LV perfusion is normal. There is no evidence of ischemia. There is no evidence of infarction.   Left ventricular function is normal. Nuclear stress EF: 82 %. The left ventricular ejection fraction is hyperdynamic (>65%).   1.  Normal left ventricular function 2.  No evidence for scar or ischemia 3.  Low risk study  Risk Assessment/Calculations  CHA2DS2-VASc Score = 5   This indicates a 7.2% annual risk of stroke. The patient's score is based upon: CHF History: 0 HTN History: 1 Diabetes History: 1 Stroke History: 0 Vascular Disease History: 0 Age Score: 2 Gender Score: 1            Physical Exam VS:  BP 138/60 (BP Location: Left Arm, Patient Position: Sitting, Cuff Size: Normal)   Pulse 63   Ht 5' 1 (1.549 m)   Wt 151 lb 9.6 oz (68.8 kg)   SpO2 97%   BMI 28.64 kg/m        Wt Readings from Last 3 Encounters:  04/30/24 151 lb 9.6 oz (68.8 kg)  03/19/24 152 lb 9.6 oz (69.2 kg)  03/12/24 148 lb (67.1 kg)    GEN: Well nourished, well developed in no acute distress NECK: No JVD; No carotid bruits CARDIAC: RRR, no murmurs, rubs, gallops RESPIRATORY:  Clear to auscultation without rales, wheezing or rhonchi  ABDOMEN: Soft, non-tender, non-distended EXTREMITIES:  No edema; No deformity   ASSESSMENT  AND PLAN Paroxysmal atrial fibrillation status post LAA occlusion with watchman and recent TEE revealing no peridevice leak or device related thrombus.  EKG today reveals sinus rhythm with a rate of 63 with a new right bundle branch block.  Metoprolol  was continued at 12.5 mg daily and  amiodarone  has been decreased to 100 mg daily.  She has been advised that she will be able to discontinue clopidogrel on 11/30 and she will need to stay on aspirin  81 mg indefinitely.  This information is also being sent to base.  Hypertension with blood pressure today 138/68.  Blood pressure continues to remain stable.  She has been continued on furosemide  40 mg daily, metoprolol  tartrate 12.5 mg daily, and amlodipine  2.5 mg daily.  She has been encouraged to continue to monitor her pressures 1 to 2 hours postmedication administration as well.  New RBBB noted on EKG today.  Beta-blocker therapy had previously been reduced with reducing amiodarone  to 200 mg daily today as well.  Will continue to monitor with surveillance studies.  Dizziness and lightheadedness which she is not orthostatic.  Carotid duplex was recently completed revealing no evidence of stenosis in the right ICA and ECA appears to be less than 50% stenosis left ICA consistent with 1-39% stenosis in the ECA appears to be less than 50% stenosis.  Normal flow was seen in the bilateral subclavian arteries.  Continue on aspirin  therapy.  Valvular heart disease with moderate mitral stenosis and mitral regurg noted on recent echocardiogram.  Continue with conservative management.  Atypical chest pain on the right side of her chest.  With rest and on exertion with previous Lexiscan  Myoview  being low risk.  She has been continued on amlodipine  2.5 mg daily for antianginal effect.  No further ischemic evaluation needed at this time.       Dispo: Patient to return to clinic to see MD/APP in 3 months or sooner if needed for further evaluation  Signed, Cleone Hulick, NP

## 2024-04-30 NOTE — Patient Instructions (Addendum)
 Medication Instructions:  Your physician recommends the following medication changes.  STOP TAKING: Plavix  DECREASE: Amiodarone  to 100 mg daily  CONTINUE: Aspirin  81 mg indefinitely   *If you need a refill on your cardiac medications before your next appointment, please call your pharmacy*  Lab Work: No labs ordered today  If you have labs (blood work) drawn today and your tests are completely normal, you will receive your results only by: MyChart Message (if you have MyChart) OR A paper copy in the mail If you have any lab test that is abnormal or we need to change your treatment, we will call you to review the results.  Testing/Procedures: No test ordered today   Follow-Up: At [facility], you and your health needs are our priority.  As part of our continuing mission to provide you with exceptional heart care, our providers are all part of one team.  This team includes your primary Cardiologist (physician) and Advanced Practice Providers or APPs (Physician Assistants and Nurse Practitioners) who all work together to provide you with the care you need, when you need it.  Your next appointment:   3 month(s)  Provider:   You may see Redell Cave, MD or one of the following Advanced Practice Providers on your designated Care Team:   Tylene Lunch, NP

## 2024-05-01 ENCOUNTER — Ambulatory Visit: Admit: 2024-05-01 | Discharge: 2024-05-01 | Payer: MEDICARE | Attending: Audiologist

## 2024-05-01 DIAGNOSIS — H903 Sensorineural hearing loss, bilateral: Principal | ICD-10-CM

## 2024-05-01 NOTE — Progress Notes (Signed)
 HAC: Phonak Audeo I 70-R  Pt overall happy with HA sound quality. She is in today for a routine HAC  Updated HA firmware- ultra  Counseled pt regarding HA expectations  Audiogram direct completed (stable)  Reviewed HA insertion   -added canal lock- removed pt to place right receiver further in the canal   -reviewed HA insertion  Pt to follow-up in 1 year or sooner if needed

## 2024-05-06 ENCOUNTER — Telehealth: Payer: Self-pay

## 2024-05-06 NOTE — Telephone Encounter (Signed)
 I spoke with [name] at the request of [name], NP. Patient states no changes since last appointment with [name], NP on 04/30/24. Follow up appointment with [name] rescheduled from 05/07/2024 to 09/24/2024 at 9:25 am. Patient's ride with [facility] [employer] rescheduled with [name] in that office. (P: [phone]). Patient verbalized understanding and all questions answered at this time. [name], NP notified.

## 2024-05-07 ENCOUNTER — Ambulatory Visit: Admitting: Cardiology

## 2024-05-07 NOTE — Telephone Encounter (Signed)
 Attempted to call patient to confirm she made the medication change to stop Plavix 11/30 and continue 81mg  ASA daily. Call would not go through. Will try again later.

## 2024-05-08 NOTE — Telephone Encounter (Signed)
 Left message for Aleck, pharmacist at [facility] to return call to confirm med list.

## 2024-05-08 NOTE — Telephone Encounter (Signed)
 Spoke with patient. She reports feeling well. She in unsure about what medications she takes because she gets them pill packed from [facility].   Called [facility] at [place] [phone]). Pharmacist was unavailable to confirm med list. They advised return call around 12-1.

## 2024-05-09 NOTE — Telephone Encounter (Signed)
 Left another message asking for return call to confirm med list.

## 2024-05-14 NOTE — Telephone Encounter (Signed)
 Left 3rd voicemail for pharmacist, Aleck, to return call.

## 2024-05-14 NOTE — Telephone Encounter (Signed)
 [name] returned the call. Confirmed patient should no longer be on Plavix as of 11/30 and patient should be on 81mg  ASA.

## 2024-06-04 ENCOUNTER — Inpatient Hospital Stay: Payer: Medicare (Managed Care) | Attending: Oncology

## 2024-06-04 DIAGNOSIS — D509 Iron deficiency anemia, unspecified: Secondary | ICD-10-CM | POA: Diagnosis present

## 2024-06-04 DIAGNOSIS — C8519 Unspecified B-cell lymphoma, extranodal and solid organ sites: Secondary | ICD-10-CM | POA: Diagnosis not present

## 2024-06-04 LAB — RETIC PANEL
Immature Retic Fract: 9.9 % (ref 2.3–15.9)
RBC.: 4.32 MIL/uL (ref 3.87–5.11)
Retic Count, Absolute: 111.5 K/uL (ref 19.0–186.0)
Retic Ct Pct: 2.6 % (ref 0.4–3.1)
Reticulocyte Hemoglobin: 36.2 pg

## 2024-06-04 LAB — CMP (CANCER CENTER ONLY)
ALT: 21 U/L (ref 0–44)
AST: 30 U/L (ref 15–41)
Albumin: 4.3 g/dL (ref 3.5–5.0)
Alkaline Phosphatase: 45 U/L (ref 38–126)
Anion gap: 15 (ref 5–15)
BUN: 16 mg/dL (ref 8–23)
CO2: 26 mmol/L (ref 22–32)
Calcium: 9.1 mg/dL (ref 8.9–10.3)
Chloride: 97 mmol/L — ABNORMAL LOW (ref 98–111)
Creatinine: 0.95 mg/dL (ref 0.44–1.00)
GFR, Estimated: >60 mL/min
Glucose, Bld: 164 mg/dL — ABNORMAL HIGH (ref 70–99)
Potassium: 3.4 mmol/L — ABNORMAL LOW (ref 3.5–5.1)
Sodium: 138 mmol/L (ref 135–145)
Total Bilirubin: 0.4 mg/dL (ref 0.0–1.2)
Total Protein: 7.3 g/dL (ref 6.5–8.1)

## 2024-06-04 LAB — VITAMIN B12: Vitamin B-12: 467 pg/mL (ref 180–914)

## 2024-06-04 LAB — CBC WITH DIFFERENTIAL (CANCER CENTER ONLY)
Abs Immature Granulocytes: 0.03 K/uL (ref 0.00–0.07)
Basophils Absolute: 0.1 K/uL (ref 0.0–0.1)
Basophils Relative: 1 %
Eosinophils Absolute: 0.1 K/uL (ref 0.0–0.5)
Eosinophils Relative: 1 %
HCT: 39.6 % (ref 36.0–46.0)
Hemoglobin: 13.3 g/dL (ref 12.0–15.0)
Immature Granulocytes: 0 %
Lymphocytes Relative: 20 %
Lymphs Abs: 1.5 K/uL (ref 0.7–4.0)
MCH: 31 pg (ref 26.0–34.0)
MCHC: 33.6 g/dL (ref 30.0–36.0)
MCV: 92.3 fL (ref 80.0–100.0)
Monocytes Absolute: 0.5 K/uL (ref 0.1–1.0)
Monocytes Relative: 7 %
Neutro Abs: 5.4 K/uL (ref 1.7–7.7)
Neutrophils Relative %: 71 %
Platelet Count: 135 K/uL — ABNORMAL LOW (ref 150–400)
RBC: 4.29 MIL/uL (ref 3.87–5.11)
RDW: 14.4 % (ref 11.5–15.5)
WBC Count: 7.5 K/uL (ref 4.0–10.5)
nRBC: 0 % (ref 0.0–0.2)

## 2024-06-04 LAB — IRON AND TIBC
Iron: 70 ug/dL (ref 28–170)
Saturation Ratios: 18 % (ref 10.4–31.8)
TIBC: 402 ug/dL (ref 250–450)
UIBC: 331 ug/dL

## 2024-06-04 LAB — FERRITIN: Ferritin: 194 ng/mL (ref 11–307)

## 2024-06-11 ENCOUNTER — Encounter: Payer: Self-pay | Admitting: Oncology

## 2024-06-11 ENCOUNTER — Inpatient Hospital Stay: Payer: Medicare (Managed Care) | Attending: Oncology | Admitting: Oncology

## 2024-06-11 ENCOUNTER — Inpatient Hospital Stay: Payer: Medicare (Managed Care)

## 2024-06-11 VITALS — BP 132/73 | HR 57 | Temp 98.3°F | Resp 18 | Wt 150.9 lb

## 2024-06-11 DIAGNOSIS — D509 Iron deficiency anemia, unspecified: Secondary | ICD-10-CM

## 2024-06-11 DIAGNOSIS — K7460 Unspecified cirrhosis of liver: Secondary | ICD-10-CM | POA: Diagnosis not present

## 2024-06-11 DIAGNOSIS — E1122 Type 2 diabetes mellitus with diabetic chronic kidney disease: Secondary | ICD-10-CM | POA: Diagnosis not present

## 2024-06-11 DIAGNOSIS — J90 Pleural effusion, not elsewhere classified: Secondary | ICD-10-CM | POA: Diagnosis not present

## 2024-06-11 DIAGNOSIS — N183 Chronic kidney disease, stage 3 unspecified: Secondary | ICD-10-CM

## 2024-06-11 NOTE — Progress Notes (Signed)
 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 Bone marrow negative for lymphoma involvement. Peripheral blood flow cytometry is negative. 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  second opinion at [facility] reviewed.  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 However, regardless of the exact histology,  would recommend similar management of an indolent, small volume B cell lymphoma resulting in recurrent effusions. Should patient experiences recurrent fluid accumulation, would recommend treatment with a course of Rituximab monotherapy   Pleural biopsy pathology review : While a B-cell predominance raises the possibility of a low-grade lymphoma, flow cytometry was negative for a clonal population and there is no overt immunophenotypic aberrancy.  The current findings are not diagnostic of lymphoma and pending outside DNA studies (B-cell clonality) should help to further exclude lymphoid malignancy.  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 B-cell gene arrangement is negative.   Breathing status is stable.  No significant decreased  breath sounds bilateral lowe lobes.  Recommend observation    Iron  deficiency anemia Labs are reviewed and discussed with patient. Lab Results  Component Value Date   HGB 13.3 06/04/2024   TIBC 402 06/04/2024   IRONPCTSAT 18 06/04/2024   FERRITIN 194 06/04/2024    Status post IV Venofer  treatments.  Hemoglobin and iron  panel have both improved.  Hold off Venofer  treatments.   Cirrhosis of liver without ascites [facility]) patient has establish care with Dr. Onita. 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     Orders Placed This Encounter  Procedures   CBC with Differential (Cancer Center Only)    Standing Status:   Future    Expected Date:   12/09/2024    Expiration Date:   03/09/2025   CMP (Cancer Center only)    Standing Status:   Future    Expected Date:   12/09/2024    Expiration Date:   03/09/2025   Iron  and TIBC    Standing Status:   Future    Expected Date:   12/09/2024    Expiration Date:   03/09/2025   Ferritin    Standing Status:   Future    Expected Date:   12/09/2024    Expiration Date:   03/09/2025   Lactate dehydrogenase    Standing Status:   Future    Expected Date:   12/09/2024    Expiration Date:   03/09/2025   Follow-up 3 months.  All questions were answered. The patient knows to call the clinic with any problems, questions or concerns.  Zelphia Cap, MD, PhD [facility] Hematology Oncology 06/11/2024    HISTORY OF PRESENTING ILLNESS:  Erin Virginia  [name] 79 y.o. female presents to establish care for pleural effusion, unintentional weight loss.  She was recently hospitalized due to dyspnea and chest tightness.  04/07/2023  CT scan revealed significant pleural effusion, which was subsequently drained. Flow cytometry of the fluid indicated potential lymphoma cells. The patient has been experiencing inconsistent breathing, with noticeable shortness of breath upon exertion, a  significant change from her previous ability to walk for a mile multiple times a week. These symptoms began approximately two weeks prior to the consultation.  The patient's appetite has been notably decreased, with consumption of only one substantial meal per day. This has resulted in a weight loss of over twenty pounds in the past few months. The patient reports some sweating, not on a daily basis, for the past year.  The patient also reports a history of shoulder injury due to a fall down a flight of stairs in the 1990s, which required multiple surgeries. She currently manages the pain with Tramadol . The patient lives alone and has been experiencing difficulty with mobility due to her current health condition.  05/29/23  Pleura, flow cytometry:  -  No immunophenotypic evidence of a lymphoproliferative sorter (i.e. no  monoclonal B cells or immunophenotypically abnormal T cells detected.   A. PLEURA, BIOPSY #1: -  Pleura with prominent atypical lymphoid infiltrate, see note.  B. PLEURA, BIOPSY#2: -  Pleural with prominent atypical lymphoid infiltrate, see note  Note: The fragments of pleura having underlying population of predominantly small lymphocytes but also percolate into adjacent adipose tissue.  This lymphoid infiltrate is a mix of both B and T cells; however, the B cells do predominate and are the prominent cell that percolates into the fat.  These B cells are CD20/PAX5 positive and are essentially negative for both CD5 and CD10.  CD10/BCL6 do highlight a few small irregular germinal centers that are appropriately negative for Bcl-2 CD43 is appropriately coexpressed on the T cells and not on the B cells.  Cyclin D1 is negative.  The proliferation rate is appropriately high in the germinal centers but is low less than 5% elsewhere.  Flow cytometry was performed and identified polytypic B cells and no immunophenotypic abnormality in the T cells.  Given the lack of established  clonality, the diagnosis of a lymphoproliferative disorder cannot be made; however, the prominent B-cell population as well as infiltration into adjacent fat is still concerning for possible low-grade B-cell lymphoma given the presence of clonality in the peripheral blood.  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  Molecular testing for B-cell clonality is pending.  Also clinically indicated the specimen could be sent for a NexGen lymphoid panel.  Clinical correlation recommended.   INTERVAL HISTORY Erin Virginia  [name] is a 79 y.o. female who has above history reviewed by me today presents for follow up visit for recurrent pleural effusion,  Discussed the use of AI scribe software for clinical note transcription with the patient, who gave verbal consent to proceed.   Over the past month, she has experienced progressive abdominal distension with associated weight gain from 146 lbs to approximately 150 lbs, without increased oral intake. She is concerned about fluid retention and suspects recurrent ascites, similar to prior episodes. She last saw her gastroenterologist approximately six months ago and is due for follow-up. She denies persistent or significant dyspnea, though she occasionally experiences mild, infrequent episodes. No worsening respiratory symptoms at this time.  She continues to have recurrent epistaxis, which she attributes to her current regimen of clopidogrel and aspirin , prescribed by cardiologist. There is a plan  for her to eventually stop clopidogrel and just take Aspirin  81mg .    Denies hematochezia, hematuria, hematemesis, epistaxis, black tarry stool.       MEDICAL HISTORY:  Past Medical History:  Diagnosis Date   A-fib (HCC)    Anemia    Anxiety    Arthritis    Asthma    Cirrhosis (HCC)    COPD (chronic obstructive pulmonary disease) (HCC)    Diabetes mellitus without complication (HCC)    type 2   DVT  (deep venous thrombosis) (HCC) 1980   GERD (gastroesophageal reflux disease)    History of hiatal hernia    Hypertension    Hypothyroidism    Lymphoma (HCC)     SURGICAL HISTORY: Past Surgical History:  Procedure Laterality Date   CATARACT EXTRACTION Bilateral    CHEST TUBE INSERTION Right 05/29/2023   Procedure: INSERTION PLEURAL DRAINAGE CATHETER;  Surgeon: Shyrl Linnie KIDD, MD;  Location: [facility] OR;  Service: Thoracic;  Laterality: Right;   CHOLECYSTECTOMY     COLONOSCOPY     ESOPHAGOGASTRODUODENOSCOPY (EGD) WITH PROPOFOL  N/A 01/17/2022   Procedure: ESOPHAGOGASTRODUODENOSCOPY (EGD) WITH PROPOFOL ;  Surgeon: Onita Elspeth Sharper, DO;  Location: [facility] ENDOSCOPY;  Service: Gastroenterology;  Laterality: N/A;   HUMERUS SURGERY Left    fracture (7 surgeries total)   IR BONE MARROW BIOPSY & ASPIRATION  04/26/2023   LEFT ATRIAL APPENDAGE OCCLUSION N/A 11/02/2023   Procedure: LEFT ATRIAL APPENDAGE OCCLUSION;  Surgeon: Kennyth Chew, MD;  Location: [facility] INVASIVE CV LAB;  Service: Cardiovascular;  Laterality: N/A;   LOOP RECORDER INSERTION  06/25/16   pt states battery died, not transmitting, just there   MENISCUS REPAIR Right 25-Jun-2017   REMOVAL OF PLEURAL DRAINAGE CATHETER Right 07/05/2023   Procedure: REMOVAL OF PLEURAL DRAINAGE CATHETER;  Surgeon: Malka Domino, MD;  Location: [facility] ORS;  Service: Pulmonary;  Laterality: Right;   THYROIDECTOMY     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; reconstruction from hip bone   TONSILECTOMY, ADENOIDECTOMY, BILATERAL MYRINGOTOMY AND TUBES     TOTAL KNEE ARTHROPLASTY Right 03/14/2022   Procedure: TOTAL KNEE ARTHROPLASTY;  Surgeon: Lorelle Hussar, MD;  Location: [facility] ORS;  Service: Orthopedics;  Laterality: Right;   TOTAL SHOULDER REPLACEMENT Left    TRANSESOPHAGEAL ECHOCARDIOGRAM (CATH LAB) N/A 11/02/2023   Procedure: TRANSESOPHAGEAL ECHOCARDIOGRAM;  Surgeon: Kennyth Chew, MD;  Location: [facility] INVASIVE CV LAB;  Service: Cardiovascular;  Laterality: N/A;    TRANSESOPHAGEAL ECHOCARDIOGRAM (CATH LAB) N/A 01/03/2024   Procedure: TRANSESOPHAGEAL ECHOCARDIOGRAM;  Surgeon: Kate Lonni CROME, MD;  Location: [facility] INVASIVE CV LAB;  Service: Cardiovascular;  Laterality: N/A;   TUBAL LIGATION     VIDEO ASSISTED THORACOSCOPY Right 05/29/2023   Procedure: RIGHT VIDEO ASSISTED THORACOSCOPY FOR PLEURAL BIOPSY;  Surgeon: Shyrl Linnie KIDD, MD;  Location: [facility] OR;  Service: Thoracic;  Laterality: Right;   WRIST SURGERY Left    multiple fractures from falls (14 surgeries from wrist to shoulder)    SOCIAL HISTORY: Social History   Socioeconomic History   Marital status: Widowed    Spouse name: Not on file   Number of children: 6   Years of education: Not on file   Highest education level: Not on file  Occupational History   Not on file  Tobacco Use   Smoking status: Never    Passive exposure: Past   Smokeless tobacco: Never  Vaping Use   Vaping status: Never Used  Substance and Sexual Activity   Alcohol  use: Never   Drug use: Never  Sexual activity: Not Currently  Other Topics Concern   Not on file  Social History Narrative   Lives in senior housing in [place] (apt.)   Social Drivers of Health   Tobacco Use: Low Risk (06/11/2024)   Patient History    Smoking Tobacco Use: Never    Smokeless Tobacco Use: Never    Passive Exposure: Past  Financial Resource Strain: Medium Risk (06/20/2023)   Received from [facility]   Overall Financial Resource Strain (CARDIA)    Difficulty of Paying Living Expenses: Somewhat hard  Food Insecurity: No Food Insecurity (11/02/2023)   Hunger Vital Sign    Worried About Running Out of Food in the Last Year: Never true    Ran Out of Food in the Last Year: Never true  Transportation Needs: No Transportation Needs (11/02/2023)   PRAPARE - Administrator, Civil Service (Medical): No    Lack of Transportation (Non-Medical): No  Physical Activity: Not on file  Stress: Not on file  Social  Connections: Unknown (11/02/2023)   Social Connection and Isolation Panel    Frequency of Communication with Friends and Family: More than three times a week    Frequency of Social Gatherings with Friends and Family: More than three times a week    Attends Religious Services: Not on file    Active Member of Clubs or Organizations: No    Attends Banker Meetings: Never    Marital Status: Widowed  Intimate Partner Violence: Not At Risk (11/02/2023)   Humiliation, Afraid, Rape, and Kick questionnaire    Fear of Current or Ex-Partner: No    Emotionally Abused: No    Physically Abused: No    Sexually Abused: No  Depression (PHQ2-9): Low Risk (11/29/2023)   Depression (PHQ2-9)    PHQ-2 Score: 0  Alcohol  Screen: Not on file  Housing: Unknown (11/07/2023)   Received from [facility]   Epic    Unable to Pay for Housing in the Last Year: Not on file    Number of Times Moved in the Last Year: Not on file    At any time in the past 12 months, were you homeless or living in a shelter (including now)?: No  Utilities: Not At Risk (11/02/2023)   [facility] Utilities    Threatened with loss of utilities: No  Health Literacy: Medium Risk (06/20/2023)   Received from [facility]    How often do you need to have someone help you when you read instructions, pamphlets, or other written material from your doctor or pharmacy?: Sometimes    FAMILY HISTORY: Family History  Problem Relation Age of Onset   Kidney disease Mother    Tuberculosis Father     ALLERGIES:  is allergic to celebrex  [celecoxib ], contrast media [iodinated contrast media], iodine , oxycodone, codeine, and cephalosporins.  MEDICATIONS:  Current Outpatient Medications  Medication Sig Dispense Refill   acetaminophen  (TYLENOL ) 650 MG CR tablet Take 1,300 mg by mouth at bedtime.     albuterol  (VENTOLIN  HFA) 108 (90 Base) MCG/ACT inhaler Inhale 2 puffs into the lungs every 6 (six) hours as  needed for wheezing.     amiodarone  (PACERONE ) 100 MG tablet Take 1 tablet (100 mg total) by mouth daily. 90 tablet 3   amLODipine  (NORVASC ) 2.5 MG tablet Take 1 tablet (2.5 mg total) by mouth daily. 90 tablet 3   Ascorbic Acid  (VITAMIN C ) 1000 MG tablet Take 1,000 mg by mouth daily.  ASPIRIN  LOW DOSE 81 MG tablet Take 81 mg by mouth daily. Patient to be on indefinitely     bisacodyl  (DULCOLAX) 5 MG EC tablet Take 5 mg by mouth daily as needed for moderate constipation. Take for three days in a row     Carboxymeth-Glyc-Polysorb PF (REFRESH OPTIVE ADVANCED PF) 0.5-1-0.5 % SOLN Place 1 drop into both eyes 4 (four) times daily as needed (dry eye).     clopidogrel (PLAVIX) 75 MG tablet Take 75 mg by mouth daily.     Docosahexaenoic Acid (DHA OMEGA 3 PO) Take 1,000 mg by mouth daily. Fish oil epa     escitalopram  (LEXAPRO ) 10 MG tablet Take 10 mg by mouth daily.     ferrous sulfate  325 (65 FE) MG tablet Take 325 mg by mouth every Monday, Wednesday, and Friday.     fluticasone  (FLONASE ) 50 MCG/ACT nasal spray Place 2 sprays into both nostrils daily as needed for allergies or rhinitis.     fluticasone -salmeterol (ADVAIR HFA) 115-21 MCG/ACT inhaler Inhale 2 puffs into the lungs 2 (two) times daily.     furosemide  (LASIX ) 40 MG tablet Take 40 mg by mouth daily.     guaiFENesin  (MUCINEX ) 600 MG 12 hr tablet Take 600 mg by mouth daily.     ipratropium-albuterol  (DUONEB) 0.5-2.5 (3) MG/3ML SOLN Take 3 mLs by nebulization every 6 (six) hours as needed (Cough , Shortness of breath or wheezing).     levothyroxine  (SYNTHROID ) 150 MCG tablet Take 150 mcg by mouth daily before breakfast.     lidocaine  (LIDODERM ) 5 % Place 1 patch onto the skin daily as needed (pain). Right Knee. Remove 12 hours later.     Magnesium  Oxide 250 MG TABS Take 250 mg by mouth daily.     metoprolol  tartrate (LOPRESSOR ) 25 MG tablet Take 0.5 tablets (12.5 mg total) by mouth daily with breakfast. Please discontinue PM dose of Metoprolol   45 tablet 3   nitroGLYCERIN  (NITROSTAT ) 0.4 MG SL tablet Place 0.4 mg under the tongue every 5 (five) minutes as needed for chest pain.     olopatadine  (PATADAY ) 0.1 % ophthalmic solution Place 1 drop into both eyes 4 (four) times daily as needed for allergies.     OXYGEN Inhale 2 L into the lungs daily as needed (During  COPD exacerbation- Use as needed for shortness of breath , plus ox <89%., and/or breathing faster than 20/ minute).     pantoprazole  (PROTONIX ) 40 MG tablet Take 40 mg by mouth 2 (two) times daily before a meal.     potassium chloride  (KLOR-CON ) 10 MEQ tablet Take 10 mEq by mouth daily.     potassium chloride  (KLOR-CON ) 8 MEQ tablet Take 8 mEq by mouth at bedtime as needed (for leg cramps).     rOPINIRole  (REQUIP ) 0.25 MG tablet Take 0.25 mg by mouth at bedtime.     senna (SENOKOT) 8.6 MG TABS tablet Take 2 tablets by mouth in the morning and at bedtime.     traMADol  (ULTRAM ) 50 MG tablet Take 50 mg by mouth every 4 (four) hours as needed for moderate pain (pain score 4-6) (Mild pain).     traZODone  (DESYREL ) 150 MG tablet Take 150 mg by mouth at bedtime.     Turmeric 500 MG TABS Take 500 mg by mouth daily.     Vitamin D -Vitamin K  (VITAMIN K2-VITAMIN D3 PO) Take 1 capsule by mouth daily. 10000 units/ 45 mcg     Vitamins A & D (VITAMIN A &  D) ointment Apply 1 Application topically 3 (three) times daily as needed for dry skin.     No current facility-administered medications for this visit.    Review of Systems  Constitutional:  Positive for fatigue. Negative for appetite change, chills and fever.  HENT:   Negative for hearing loss and voice change.   Eyes:  Negative for eye problems.  Respiratory:  Negative for chest tightness, cough and shortness of breath.   Cardiovascular:  Negative for chest pain.  Gastrointestinal:  Negative for abdominal distention, abdominal pain and blood in stool.  Endocrine: Negative for hot flashes.  Genitourinary:  Negative for difficulty  urinating and frequency.   Musculoskeletal:  Negative for arthralgias.  Skin:  Negative for itching and rash.  Neurological:  Negative for extremity weakness.  Hematological:  Negative for adenopathy.  Psychiatric/Behavioral:  Positive for depression. Negative for confusion. The patient is nervous/anxious.      PHYSICAL EXAMINATION: ECOG PERFORMANCE STATUS: 1 - Symptomatic but completely ambulatory  Vitals:   06/11/24 1035  BP: 132/73  Pulse: (!) 57  Resp: 18  Temp: 98.3 F (36.8 C)  SpO2: 96%   Filed Weights   06/11/24 1035  Weight: 150 lb 14.4 oz (68.4 kg)    Physical Exam Constitutional:      General: She is not in acute distress. HENT:     Head: Normocephalic and atraumatic.  Eyes:     General: No scleral icterus. Cardiovascular:     Rate and Rhythm: Normal rate and regular rhythm.  Pulmonary:     Effort: Pulmonary effort is normal. No respiratory distress.     Comments: Decreased breath sound right lower lobe Abdominal:     General: There is no distension.     Palpations: Abdomen is soft.  Musculoskeletal:        General: Normal range of motion.     Cervical back: Normal range of motion and neck supple.  Skin:    Findings: No erythema.  Neurological:     Mental Status: She is alert and oriented to person, place, and time. Mental status is at baseline.     Motor: No abnormal muscle tone.  Psychiatric:        Mood and Affect: Affect normal.      LABORATORY DATA:  I have reviewed the data as listed    Latest Ref Rng & Units 06/04/2024    9:00 AM 03/19/2024   10:15 AM 12/22/2023   11:47 AM  CBC  WBC 4.0 - 10.5 K/uL 7.5  7.2  8.2   Hemoglobin 12.0 - 15.0 g/dL 86.6  86.8  87.0   Hematocrit 36.0 - 46.0 % 39.6  41.0  39.9   Platelets 150 - 400 K/uL 135  144  150       Latest Ref Rng & Units 06/04/2024    9:00 AM 03/19/2024   10:15 AM 12/22/2023   11:47 AM  CMP  Glucose 70 - 99 mg/dL 835  868  883   BUN 8 - 23 mg/dL 16  16  14    Creatinine 0.44 -  1.00 mg/dL 9.04  9.19  9.15   Sodium 135 - 145 mmol/L 138  139  138   Potassium 3.5 - 5.1 mmol/L 3.4  3.5  4.3   Chloride 98 - 111 mmol/L 97  99  98   CO2 22 - 32 mmol/L 26  22  21    Calcium  8.9 - 10.3 mg/dL 9.1  8.7  9.2  Total Protein 6.5 - 8.1 g/dL 7.3  6.7    Total Bilirubin 0.0 - 1.2 mg/dL 0.4  0.3    Alkaline Phos 38 - 126 U/L 45  44    AST 15 - 41 U/L 30  19    ALT 0 - 44 U/L 21  13       RADIOGRAPHIC STUDIES: I have personally reviewed the radiological images as listed and agreed with the findings in the report. No results found.    "

## 2024-06-11 NOTE — Assessment & Plan Note (Signed)
 Labs are reviewed and discussed with patient. Lab Results  Component Value Date   HGB 13.3 06/04/2024   TIBC 402 06/04/2024   IRONPCTSAT 18 06/04/2024   FERRITIN 194 06/04/2024    Status post IV Venofer  treatments.  Hemoglobin and iron  panel have both improved.  Hold off Venofer  treatments.

## 2024-06-11 NOTE — Assessment & Plan Note (Signed)
-  avoid nephrotoxins

## 2024-06-11 NOTE — Assessment & Plan Note (Addendum)
 patient has establish care with Dr. Onita. Recommend patient to follow up She is concerned about fluid retention. Physical examination did not reveal significant abdomen distention.

## 2024-06-11 NOTE — Assessment & Plan Note (Addendum)
 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 Bone marrow negative for lymphoma involvement. Peripheral blood flow cytometry is negative. 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  second opinion at [facility] reviewed.  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 However, regardless of the exact histology,  would recommend similar management of an indolent, small volume B cell lymphoma resulting in recurrent effusions. Should patient experiences recurrent fluid accumulation, would recommend treatment with a course of Rituximab monotherapy   Pleural biopsy pathology review : While a B-cell predominance raises the possibility of a low-grade lymphoma, flow cytometry was negative for a clonal population and there is no overt immunophenotypic aberrancy.  The current findings are not diagnostic of lymphoma and pending outside DNA studies (B-cell clonality) should help to further exclude lymphoid malignancy.  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 B-cell gene arrangement is negative.   Breathing status is stable.  No significant decreased breath sounds bilateral lowe lobes.  Recommend observation

## 2024-06-20 ENCOUNTER — Ambulatory Visit: Admit: 2024-06-20 | Discharge: 2024-06-20 | Payer: MEDICARE

## 2024-06-20 DIAGNOSIS — K029 Dental caries, unspecified: Principal | ICD-10-CM

## 2024-06-20 NOTE — Progress Notes (Signed)
 [facility]    Subjective   Visit Type:  OMS EST IMPLANT  Pt. Name: [name]  Pt. MRN: [id number]  DOB: [birth year]              Sex: female  Visit Date:  06/20/2024  Provider:  Joshua Fischer, MD, DMD  Location of Care: [facility] Complaint   Patient presents with    New Consult - Oral Maxillofacial Surgery      Evaluate #20       HPI  [name] is a/an 79 y.o. female with pmhx sig for osteoporosis (h/o Fosamax use >33yrs total, now receiving Prolia injections q2x/yr, received 1 injection so far), s/p right shoulder arthroplasty and left knee replacement, sensorineural hearing loss who is now referred from general dentist for extraction of tooth #20.  Pt reports intermittent pain from this site.   Pt denies swelling, drainage, trismus, NVFC, dyspnea, dysphagia, dysphonia, shortness of breath, chest pain or any other symptoms.    Past Med/Surg/Family/Social History:  Allergies: Allergies[1]  Medical History:   Past Medical History:   Diagnosis Date    Acquired absence of ovary, unilateral     Missing left ovary.    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    History of fall     fell 10/2015    Low back pain     Osteoarthritis     Osteoporosis     Urticaria     Varicella      Medications:   Current Outpatient Medications   Medication Sig    alendronate Take 1 tablet (35 mg total) by mouth every 7 days. (Patient not taking: Reported on 03/07/2024)    calcium  carbonate Chew 1 tablet (1,250 mg total) by mouth daily. (Patient not taking: Reported on 03/07/2024)    fluticasone  propionate shake liquid and use 2 sprays in each nostril daily (Patient not taking: Reported on 03/07/2024)    lactobacillus rhamnosus (GG) Take 1 capsule by mouth daily. (Patient not taking: Reported  on 03/07/2024)    ofloxacin  Place 5 drops into the right ear daily. (Patient not taking: Reported on 03/07/2024)    ofloxacin  Place 5 drops into the right ear daily. (Patient not taking: Reported on 03/07/2024)    predniSONE  Take 3 tablets ( 60 mg) once daily for seven days. Then take 2 tablets ( 40 mg) once daily for three days. Then take 1 tablets ( 20 mg) once daily for two days. Then take 1/2 tablet ( 10 mg) once daily for two days. (Patient not taking: Reported on 03/07/2024)    psyllium Take 1 tbsp in 8 oz water  daily    senna Take 1 tablet by mouth every other day.    senna-docusate Take 1 tablet by mouth every 12 hours as needed for Constipation. (Patient not taking: Reported on 03/07/2024)     Current Facility-Administered Medications   Medication Dose Frequency Provider Last Admin    BUPivacaine  HCl  2 mL Once Marissa Lindsey Baum, [place]      lidocaine   2 mL Once Marissa Lindsey [place], [place]      triamcinolone  acetonide  80 mg Once Marissa Lindsey [place], [place]  Past Surgical History:   Procedure Laterality Date    ARTHROSCOPY SHOULDER Right 02/17/2022    Procedure: Right Shoulder Arthroscopic Balloon Arthroplasty;  Surgeon: Redell Age, MD;  Location: [facility];  Service: Orthopedics;  Laterality: Right;    CESAREAN SECTION  1985    COLONOSCOPY N/A 01/30/2023    Procedure: COLONOSCOPY W/ BIOPSY;  Surgeon: Sheree JULIANNA Booker, MD;  Location: [facility];  Service: Gastroenterology;  Laterality: N/A;    HARDWARE REMOVAL  8/09    of left tibia plateau    LEG SURGERY  05/09/06    fractured left tibia plateau    RELEASE CARPAL TUNNEL Left 03/22/2016    Procedure: LEFT CARPAL TUNNEL RELEASE;  Surgeon: Maude Levels, MD;  Location: [facility];  Service: Orthopedics;  Laterality: Left;    TOTAL KNEE ARTHROPLASTY Left 11/09/2021    Procedure: LEFT TOTAL KNEE ARTHROPLASTY;  Surgeon: Ricardo Red Daniels, MD;  Location: [facility] OR;  Service: Orthopedics;  Laterality: Left;    WRIST FRACTURE SURGERY Left 11/13/2015    Procedure: OPEN REDUCTION INTERNAL  FIXATION LEFT DISTAL RADIUS FRACTURE;  Surgeon: Maude Levels, MD;  Location: [facility];  Service: Orthopedics;  Laterality: Left;     Family History   Problem Relation Age of Onset    Stroke Mother     Melanoma Neg Hx      Social History     Occupational History    Not on file   Tobacco Use    Smoking status: Never    Smokeless tobacco: Never   Vaping Use    Vaping status: Never Used   Substance and Sexual Activity    Alcohol use: Yes     Alcohol/week: 4.0 standard drinks of alcohol     Types: 4 Glasses of wine per week     Comment: ocasionally    Drug use: No     Comment: 12-23-2009    Sexual activity: Not Currently     Partners: Male       ROS:     Vitals:    06/20/24 0858   BP: (!) 148/95   Pulse: 75   SpO2: 92%     Body mass index is 15.64 kg/m??.    Review of Systems: 10-point ROS completed and is negative except noted in HPI.         Objective     Maxillofacial:   Atraumatic  Normo-cephalic  No facial swelling  No trismus  CN II-XII intact    Oral:   Normal salivary flow, mucosa moist and pink  No vestibular edema/swelling/erythema  No uvular deviation, FOM soft and non-tender  No signs of acute infection  No purulence or drainage or fistulae noted  No soft tissue pathology  Fractured crown #20      Radiographic Evaluation/Imaging  PA from general dentist: Fractured crown #20 to gumline.       Assessment & Plan   ASA Classification: 2    [name] is a/an 79 y.o. 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 Discussed risk of MRONJ given h/o Fosamax and Prolia use. Discussed what MRONJ is and the possible need for additional procedures should a bone infection develop. Leaving an infected tooth in place however increases her risk of MRONJ further and I do recommend tooth #20 be extracted.  However,  I do not recommend we place an implant due to the risk of MRONJ. Her adjacent teeth have crowns and restorations currently and a bridge would be the optimal treatment option in terms of restoration. Called and spoke with Dr. Pohlman and relayed this information and he voiced understanding.     Return for extraction of tooth 20 under LA.  Informed consent will be obtained on day of surgery.  Risks, benefits, complications and treatment options discussed with patient.  Pre-operative instructions: No special instructions    Joshua Fischer, DMD, MD  06/20/2024 9:58 AM  [facility]  [facility]  [address]  [address]  [place] [id number]  Dept: [phone]                     [1]   Allergies  Allergen Reactions    Iodine Anaphylaxis, Hives, Shortness Of Breath, Other (See Comments) and Itching     IVP dye for a kidney function test greater than 20 years ago    Iodine And Iodide Containing Products Anaphylaxis     Respiratory Distress  IVP dye for a kidney function test greater than 20 years ago  Respiratory Distress    Iodinated Contrast Media      Shortness of breath, hives.    Shellfish Containing Products      Advised to avoid shellfish due to allergy to iodine.

## 2024-07-04 ENCOUNTER — Ambulatory Visit: Payer: MEDICARE

## 2024-07-19 ENCOUNTER — Ambulatory Visit: Admitting: Pulmonary Disease

## 2024-07-23 ENCOUNTER — Ambulatory Visit: Payer: MEDICARE

## 2024-07-31 ENCOUNTER — Ambulatory Visit: Admitting: Cardiology

## 2024-09-24 ENCOUNTER — Ambulatory Visit: Admitting: Cardiology

## 2024-12-10 ENCOUNTER — Inpatient Hospital Stay

## 2024-12-17 ENCOUNTER — Inpatient Hospital Stay: Admitting: Oncology

## 2024-12-17 ENCOUNTER — Inpatient Hospital Stay
